# Patient Record
Sex: Female | Born: 1940
Health system: Southern US, Community
[De-identification: ages and names within clinical notes are randomized; demographics above are authoritative.]

## PROBLEM LIST (undated history)

## (undated) DIAGNOSIS — IMO0002 Reserved for concepts with insufficient information to code with codable children: Secondary | ICD-10-CM

## (undated) DIAGNOSIS — E039 Hypothyroidism, unspecified: Secondary | ICD-10-CM

## (undated) DIAGNOSIS — S62102A Fracture of unspecified carpal bone, left wrist, initial encounter for closed fracture: Secondary | ICD-10-CM

## (undated) DIAGNOSIS — R002 Palpitations: Secondary | ICD-10-CM

## (undated) DIAGNOSIS — M542 Cervicalgia: Secondary | ICD-10-CM

## (undated) DIAGNOSIS — M549 Dorsalgia, unspecified: Secondary | ICD-10-CM

## (undated) DIAGNOSIS — M810 Age-related osteoporosis without current pathological fracture: Secondary | ICD-10-CM

## (undated) DIAGNOSIS — N289 Disorder of kidney and ureter, unspecified: Secondary | ICD-10-CM

## (undated) DIAGNOSIS — L570 Actinic keratosis: Secondary | ICD-10-CM

## (undated) DIAGNOSIS — J929 Pleural plaque without asbestos: Secondary | ICD-10-CM

## (undated) DIAGNOSIS — I10 Essential (primary) hypertension: Secondary | ICD-10-CM

## (undated) DIAGNOSIS — C50919 Malignant neoplasm of unspecified site of unspecified female breast: Secondary | ICD-10-CM

## (undated) DIAGNOSIS — Z923 Personal history of irradiation: Secondary | ICD-10-CM

## (undated) DIAGNOSIS — K5730 Diverticulosis of large intestine without perforation or abscess without bleeding: Secondary | ICD-10-CM

## (undated) DIAGNOSIS — Z9221 Personal history of antineoplastic chemotherapy: Secondary | ICD-10-CM

## (undated) DIAGNOSIS — K219 Gastro-esophageal reflux disease without esophagitis: Secondary | ICD-10-CM

## (undated) DIAGNOSIS — N809 Endometriosis, unspecified: Secondary | ICD-10-CM

## (undated) HISTORY — DX: Dorsalgia, unspecified: M54.9

## (undated) HISTORY — DX: Malignant neoplasm of unspecified site of unspecified female breast: C50.919

## (undated) HISTORY — DX: Palpitations: R00.2

## (undated) HISTORY — PX: ENDOMETRIAL ABLATION: SHX621

## (undated) HISTORY — DX: Reserved for concepts with insufficient information to code with codable children: IMO0002

## (undated) HISTORY — DX: Diverticulosis of large intestine without perforation or abscess without bleeding: K57.30

## (undated) HISTORY — DX: Actinic keratosis: L57.0

## (undated) HISTORY — DX: Cervicalgia: M54.2

## (undated) HISTORY — DX: Hypothyroidism, unspecified: E03.9

## (undated) HISTORY — DX: Gastro-esophageal reflux disease without esophagitis: K21.9

## (undated) HISTORY — DX: Essential (primary) hypertension: I10

## (undated) HISTORY — DX: Fracture of unspecified carpal bone, left wrist, initial encounter for closed fracture: S62.102A

## (undated) HISTORY — DX: Pleural plaque without asbestos: J92.9

## (undated) HISTORY — DX: Age-related osteoporosis without current pathological fracture: M81.0

## (undated) HISTORY — PX: TUBAL LIGATION: SHX77

## (undated) HISTORY — DX: Endometriosis, unspecified: N80.9

---

## 1999-08-20 DIAGNOSIS — C50919 Malignant neoplasm of unspecified site of unspecified female breast: Secondary | ICD-10-CM

## 1999-08-20 HISTORY — PX: EXCISION / BIOPSY BREAST / NIPPLE / DUCT: SUR469

## 1999-08-20 HISTORY — PX: BREAST LUMPECTOMY: SHX2

## 1999-08-20 HISTORY — DX: Malignant neoplasm of unspecified site of unspecified female breast: C50.919

## 2002-03-19 ENCOUNTER — Encounter: Payer: Self-pay | Admitting: Internal Medicine

## 2006-03-13 ENCOUNTER — Ambulatory Visit: Payer: Self-pay | Admitting: Family Medicine

## 2006-04-23 ENCOUNTER — Ambulatory Visit: Payer: Self-pay | Admitting: Oncology

## 2006-06-09 ENCOUNTER — Ambulatory Visit: Payer: Self-pay | Admitting: Family Medicine

## 2006-06-09 LAB — CONVERTED CEMR LAB
Basophils Absolute: 0 10*3/uL (ref 0.0–0.1)
CO2: 31 meq/L (ref 19–32)
Calcium: 9.5 mg/dL (ref 8.4–10.5)
Chloride: 107 meq/L (ref 96–112)
Chol/HDL Ratio, serum: 4.2
Cholesterol: 189 mg/dL (ref 0–200)
Creatinine, Ser: 0.9 mg/dL (ref 0.4–1.2)
Glomerular Filtration Rate, Af Am: 81 mL/min/{1.73_m2}
Glucose, Bld: 86 mg/dL (ref 70–99)
HDL: 44.7 mg/dL (ref >39.0)
Hemoglobin: 14.9 g/dL (ref 12.0–15.0)
LDL Cholesterol: 129 mg/dL — ABNORMAL HIGH (ref 0–99)
Lymphocytes Relative: 27.5 % (ref 12.0–46.0)
MCV: 89.6 fL (ref 78.0–100.0)
Monocytes Absolute: 0.6 10*3/uL (ref 0.2–0.7)
Monocytes Relative: 8.7 % (ref 3.0–11.0)
Neutrophils Relative %: 61.4 % (ref 43.0–77.0)
Platelets: 198 10*3/uL (ref 150–400)
TSH: 1.1 microintl units/mL (ref 0.35–5.50)
Triglyceride fasting, serum: 78 mg/dL (ref 0–149)

## 2006-06-16 ENCOUNTER — Ambulatory Visit: Payer: Self-pay | Admitting: Family Medicine

## 2006-06-23 ENCOUNTER — Encounter: Admission: RE | Admit: 2006-06-23 | Discharge: 2006-06-23 | Payer: Self-pay | Admitting: Oncology

## 2006-09-16 ENCOUNTER — Ambulatory Visit: Payer: Self-pay | Admitting: Family Medicine

## 2006-09-30 ENCOUNTER — Ambulatory Visit: Payer: Self-pay | Admitting: Internal Medicine

## 2006-10-06 ENCOUNTER — Ambulatory Visit: Payer: Self-pay | Admitting: Internal Medicine

## 2006-11-17 ENCOUNTER — Ambulatory Visit: Payer: Self-pay | Admitting: Internal Medicine

## 2007-05-21 ENCOUNTER — Ambulatory Visit: Payer: Self-pay | Admitting: Oncology

## 2007-06-11 ENCOUNTER — Ambulatory Visit: Payer: Self-pay | Admitting: Family Medicine

## 2007-06-11 DIAGNOSIS — J309 Allergic rhinitis, unspecified: Secondary | ICD-10-CM | POA: Insufficient documentation

## 2007-06-11 LAB — CONVERTED CEMR LAB
ALT: 25 units/L (ref 0–35)
AST: 23 units/L (ref 0–37)
Alkaline Phosphatase: 83 units/L (ref 39–117)
BUN: 13 mg/dL (ref 6–23)
Basophils Relative: 0.3 % (ref 0.0–1.0)
Bilirubin, Direct: 0.1 mg/dL (ref 0.0–0.3)
CO2: 31 meq/L (ref 19–32)
Calcium: 10.1 mg/dL (ref 8.4–10.5)
Chloride: 106 meq/L (ref 96–112)
Creatinine, Ser: 0.8 mg/dL (ref 0.4–1.2)
Eosinophils Relative: 1.9 % (ref 0.0–5.0)
GFR calc Af Amer: 93 mL/min
Glucose, Bld: 91 mg/dL (ref 70–99)
HDL: 51.2 mg/dL (ref >39.0)
Lymphocytes Relative: 20.3 % (ref 12.0–46.0)
Monocytes Relative: 8.7 % (ref 3.0–11.0)
Neutro Abs: 7 10*3/uL (ref 1.4–7.7)
Platelets: 220 10*3/uL (ref 150–400)
RDW: 12.1 % (ref 11.5–14.6)
Total Bilirubin: 0.9 mg/dL (ref 0.3–1.2)
Total CHOL/HDL Ratio: 4.4
Total Protein: 7.5 g/dL (ref 6.0–8.3)
VLDL: 23 mg/dL (ref 0–40)
WBC: 10.2 10*3/uL (ref 4.5–10.5)

## 2007-06-17 ENCOUNTER — Ambulatory Visit: Payer: Self-pay | Admitting: Internal Medicine

## 2007-06-17 LAB — CONVERTED CEMR LAB
ALT: 33 units/L (ref 0–35)
Alkaline Phosphatase: 104 units/L (ref 39–117)
Basophils Absolute: 0.1 10*3/uL (ref 0.0–0.1)
Eosinophils Absolute: 0.2 10*3/uL (ref 0.0–0.6)
Eosinophils Relative: 1.9 % (ref 0.0–5.0)
Lipase: 32 units/L (ref 11.0–59.0)
Lymphocytes Relative: 14.8 % (ref 12.0–46.0)
MCHC: 33.9 g/dL (ref 30.0–36.0)
MCV: 91.3 fL (ref 78.0–100.0)
Monocytes Relative: 12 % — ABNORMAL HIGH (ref 3.0–11.0)
Neutro Abs: 7.3 10*3/uL (ref 1.4–7.7)
Platelets: 241 10*3/uL (ref 150–400)
RBC: 4.75 M/uL (ref 3.87–5.11)
Total Protein: 8 g/dL (ref 6.0–8.3)

## 2007-06-18 ENCOUNTER — Ambulatory Visit (HOSPITAL_COMMUNITY): Admission: RE | Admit: 2007-06-18 | Discharge: 2007-06-18 | Payer: Self-pay | Admitting: Internal Medicine

## 2007-06-18 ENCOUNTER — Ambulatory Visit: Payer: Self-pay | Admitting: Family Medicine

## 2007-06-18 DIAGNOSIS — Z853 Personal history of malignant neoplasm of breast: Secondary | ICD-10-CM | POA: Insufficient documentation

## 2007-06-18 DIAGNOSIS — E039 Hypothyroidism, unspecified: Secondary | ICD-10-CM | POA: Insufficient documentation

## 2007-06-18 DIAGNOSIS — E0590 Thyrotoxicosis, unspecified without thyrotoxic crisis or storm: Secondary | ICD-10-CM | POA: Insufficient documentation

## 2007-06-18 DIAGNOSIS — K829 Disease of gallbladder, unspecified: Secondary | ICD-10-CM | POA: Insufficient documentation

## 2007-06-19 ENCOUNTER — Telehealth: Payer: Self-pay | Admitting: Family Medicine

## 2007-06-25 ENCOUNTER — Encounter: Admission: RE | Admit: 2007-06-25 | Discharge: 2007-06-25 | Payer: Self-pay | Admitting: Oncology

## 2007-07-08 ENCOUNTER — Encounter: Admission: RE | Admit: 2007-07-08 | Discharge: 2007-07-08 | Payer: Self-pay | Admitting: Oncology

## 2007-07-30 ENCOUNTER — Telehealth: Payer: Self-pay | Admitting: Family Medicine

## 2007-07-31 ENCOUNTER — Ambulatory Visit: Payer: Self-pay | Admitting: Family Medicine

## 2007-08-04 ENCOUNTER — Telehealth: Payer: Self-pay | Admitting: Family Medicine

## 2007-09-21 ENCOUNTER — Ambulatory Visit: Payer: Self-pay | Admitting: Family Medicine

## 2007-09-23 ENCOUNTER — Telehealth: Payer: Self-pay | Admitting: Family Medicine

## 2007-10-12 DIAGNOSIS — C50919 Malignant neoplasm of unspecified site of unspecified female breast: Secondary | ICD-10-CM | POA: Insufficient documentation

## 2007-10-12 DIAGNOSIS — Z853 Personal history of malignant neoplasm of breast: Secondary | ICD-10-CM | POA: Insufficient documentation

## 2007-12-02 ENCOUNTER — Telehealth: Payer: Self-pay | Admitting: Family Medicine

## 2008-01-22 ENCOUNTER — Ambulatory Visit: Payer: Self-pay | Admitting: Family Medicine

## 2008-01-23 DIAGNOSIS — L570 Actinic keratosis: Secondary | ICD-10-CM | POA: Insufficient documentation

## 2008-02-09 ENCOUNTER — Telehealth: Payer: Self-pay | Admitting: Family Medicine

## 2008-03-11 ENCOUNTER — Ambulatory Visit: Payer: Self-pay | Admitting: Internal Medicine

## 2008-03-11 DIAGNOSIS — M545 Low back pain, unspecified: Secondary | ICD-10-CM | POA: Insufficient documentation

## 2008-03-11 DIAGNOSIS — M549 Dorsalgia, unspecified: Secondary | ICD-10-CM | POA: Insufficient documentation

## 2008-04-19 ENCOUNTER — Ambulatory Visit: Payer: Self-pay | Admitting: Family Medicine

## 2008-04-19 DIAGNOSIS — L253 Unspecified contact dermatitis due to other chemical products: Secondary | ICD-10-CM | POA: Insufficient documentation

## 2008-06-13 ENCOUNTER — Ambulatory Visit: Payer: Self-pay | Admitting: Family Medicine

## 2008-06-13 LAB — CONVERTED CEMR LAB
AST: 20 units/L (ref 0–37)
Albumin: 3.9 g/dL (ref 3.5–5.2)
BUN: 21 mg/dL (ref 6–23)
Basophils Absolute: 0 10*3/uL (ref 0.0–0.1)
Basophils Relative: 0.4 % (ref 0.0–3.0)
Bilirubin Urine: NEGATIVE
Blood in Urine, dipstick: NEGATIVE
Calcium: 9.6 mg/dL (ref 8.4–10.5)
Cholesterol: 191 mg/dL (ref 0–200)
Creatinine, Ser: 0.8 mg/dL (ref 0.4–1.2)
Eosinophils Absolute: 0.2 10*3/uL (ref 0.0–0.7)
Eosinophils Relative: 3.2 % (ref 0.0–5.0)
GFR calc Af Amer: 93 mL/min
GFR calc non Af Amer: 77 mL/min
Glucose, Urine, Semiquant: NEGATIVE
HCT: 40.7 % (ref 36.0–46.0)
HDL: 38.7 mg/dL — ABNORMAL LOW (ref >39.0)
MCHC: 33.5 g/dL (ref 30.0–36.0)
MCV: 91.6 fL (ref 78.0–100.0)
Monocytes Absolute: 0.5 10*3/uL (ref 0.1–1.0)
Neutro Abs: 3.9 10*3/uL (ref 1.4–7.7)
Protein, U semiquant: NEGATIVE
RBC: 4.45 M/uL (ref 3.87–5.11)
Specific Gravity, Urine: 1.015
TSH: 0.19 microintl units/mL — ABNORMAL LOW (ref 0.35–5.50)
WBC Urine, dipstick: NEGATIVE
WBC: 6.3 10*3/uL (ref 4.5–10.5)
pH: 5.5

## 2008-06-20 ENCOUNTER — Ambulatory Visit: Payer: Self-pay | Admitting: Family Medicine

## 2008-06-21 ENCOUNTER — Ambulatory Visit: Payer: Self-pay | Admitting: Oncology

## 2008-06-27 ENCOUNTER — Encounter: Admission: RE | Admit: 2008-06-27 | Discharge: 2008-06-27 | Payer: Self-pay | Admitting: Oncology

## 2008-08-19 HISTORY — PX: CHOLECYSTECTOMY: SHX55

## 2008-08-30 ENCOUNTER — Ambulatory Visit: Payer: Self-pay | Admitting: Family Medicine

## 2008-08-30 LAB — CONVERTED CEMR LAB: TSH: 8.39 microintl units/mL — ABNORMAL HIGH (ref 0.35–5.50)

## 2008-09-08 ENCOUNTER — Telehealth: Payer: Self-pay | Admitting: Family Medicine

## 2008-11-08 ENCOUNTER — Ambulatory Visit: Payer: Self-pay | Admitting: Family Medicine

## 2008-11-15 ENCOUNTER — Telehealth: Payer: Self-pay | Admitting: *Deleted

## 2008-11-15 LAB — CONVERTED CEMR LAB: TSH: 0.38 microintl units/mL (ref 0.35–5.50)

## 2008-12-06 ENCOUNTER — Ambulatory Visit: Payer: Self-pay | Admitting: Family Medicine

## 2008-12-06 ENCOUNTER — Encounter: Payer: Self-pay | Admitting: Family Medicine

## 2008-12-06 DIAGNOSIS — D235 Other benign neoplasm of skin of trunk: Secondary | ICD-10-CM | POA: Insufficient documentation

## 2008-12-06 DIAGNOSIS — D2330 Other benign neoplasm of skin of unspecified part of face: Secondary | ICD-10-CM | POA: Insufficient documentation

## 2008-12-13 ENCOUNTER — Telehealth: Payer: Self-pay | Admitting: Family Medicine

## 2009-02-08 ENCOUNTER — Encounter (INDEPENDENT_AMBULATORY_CARE_PROVIDER_SITE_OTHER): Payer: Self-pay | Admitting: *Deleted

## 2009-03-02 ENCOUNTER — Ambulatory Visit: Payer: Self-pay | Admitting: Internal Medicine

## 2009-03-02 ENCOUNTER — Telehealth: Payer: Self-pay | Admitting: Internal Medicine

## 2009-03-02 DIAGNOSIS — N809 Endometriosis, unspecified: Secondary | ICD-10-CM | POA: Insufficient documentation

## 2009-03-02 DIAGNOSIS — K5730 Diverticulosis of large intestine without perforation or abscess without bleeding: Secondary | ICD-10-CM | POA: Insufficient documentation

## 2009-03-03 LAB — CONVERTED CEMR LAB
ALT: 21 units/L (ref 0–35)
Total Bilirubin: 0.8 mg/dL (ref 0.3–1.2)

## 2009-04-07 ENCOUNTER — Encounter: Payer: Self-pay | Admitting: Internal Medicine

## 2009-04-12 ENCOUNTER — Ambulatory Visit: Payer: Self-pay | Admitting: Internal Medicine

## 2009-04-12 LAB — HM COLONOSCOPY

## 2009-06-05 ENCOUNTER — Encounter: Payer: Self-pay | Admitting: Internal Medicine

## 2009-06-05 LAB — CONVERTED CEMR LAB
ALT: 18 units/L
AST: 22 units/L
Calcium: 10 mg/dL
Chloride: 102 meq/L
Glucose, Bld: 84 mg/dL
Hemoglobin: 14.5 g/dL
Lymphocytes, automated: 31 %
MCV: 89.7 fL
Monocytes Relative: 12 %
Potassium: 4.4 meq/L

## 2009-06-09 ENCOUNTER — Ambulatory Visit (HOSPITAL_COMMUNITY): Admission: RE | Admit: 2009-06-09 | Discharge: 2009-06-09 | Payer: Self-pay | Admitting: General Surgery

## 2009-06-09 ENCOUNTER — Encounter: Payer: Self-pay | Admitting: Internal Medicine

## 2009-06-09 ENCOUNTER — Encounter (INDEPENDENT_AMBULATORY_CARE_PROVIDER_SITE_OTHER): Payer: Self-pay | Admitting: General Surgery

## 2009-06-29 ENCOUNTER — Encounter: Payer: Self-pay | Admitting: Internal Medicine

## 2009-06-30 ENCOUNTER — Encounter: Admission: RE | Admit: 2009-06-30 | Discharge: 2009-06-30 | Payer: Self-pay | Admitting: General Surgery

## 2009-07-10 ENCOUNTER — Telehealth: Payer: Self-pay | Admitting: Family Medicine

## 2009-07-21 ENCOUNTER — Encounter: Admission: RE | Admit: 2009-07-21 | Discharge: 2009-07-21 | Payer: Self-pay | Admitting: Family Medicine

## 2009-10-12 ENCOUNTER — Telehealth: Payer: Self-pay | Admitting: Family Medicine

## 2009-10-19 ENCOUNTER — Ambulatory Visit: Payer: Self-pay | Admitting: Family Medicine

## 2009-10-19 LAB — CONVERTED CEMR LAB
Free T4: 0.8 ng/dL (ref 0.6–1.6)
T3, Free: 2.3 pg/mL (ref 2.3–4.2)
TSH: 6.37 microintl units/mL — ABNORMAL HIGH (ref 0.35–5.50)

## 2009-10-20 ENCOUNTER — Telehealth: Payer: Self-pay | Admitting: Family Medicine

## 2009-12-13 ENCOUNTER — Encounter: Payer: Self-pay | Admitting: Internal Medicine

## 2009-12-18 ENCOUNTER — Ambulatory Visit: Payer: Self-pay | Admitting: Internal Medicine

## 2009-12-18 DIAGNOSIS — G8929 Other chronic pain: Secondary | ICD-10-CM | POA: Insufficient documentation

## 2009-12-18 DIAGNOSIS — M542 Cervicalgia: Secondary | ICD-10-CM | POA: Insufficient documentation

## 2009-12-18 LAB — CONVERTED CEMR LAB
ALT: 17 units/L (ref 0–35)
Alkaline Phosphatase: 67 units/L (ref 39–117)
Basophils Relative: 0.5 % (ref 0.0–3.0)
Bilirubin, Direct: 0.1 mg/dL (ref 0.0–0.3)
Calcium: 9.9 mg/dL (ref 8.4–10.5)
Creatinine, Ser: 0.8 mg/dL (ref 0.4–1.2)
Eosinophils Absolute: 0.3 10*3/uL (ref 0.0–0.7)
Eosinophils Relative: 3.6 % (ref 0.0–5.0)
GFR calc non Af Amer: 75.93 mL/min (ref >60)
HDL: 44.6 mg/dL (ref >39.00)
Hemoglobin, Urine: NEGATIVE
LDL Cholesterol: 133 mg/dL — ABNORMAL HIGH (ref 0–99)
Lymphocytes Relative: 27.9 % (ref 12.0–46.0)
Neutrophils Relative %: 58.7 % (ref 43.0–77.0)
RBC: 4.56 M/uL (ref 3.87–5.11)
Total CHOL/HDL Ratio: 4
Total Protein, Urine: NEGATIVE mg/dL
Total Protein: 7 g/dL (ref 6.0–8.3)
Triglycerides: 96 mg/dL (ref 0.0–149.0)
Urine Glucose: NEGATIVE mg/dL
WBC: 7.2 10*3/uL (ref 4.5–10.5)
pH: 5 (ref 5.0–8.0)

## 2009-12-27 ENCOUNTER — Encounter: Payer: Self-pay | Admitting: Internal Medicine

## 2010-02-12 ENCOUNTER — Encounter: Payer: Self-pay | Admitting: Internal Medicine

## 2010-03-02 ENCOUNTER — Telehealth: Payer: Self-pay | Admitting: Internal Medicine

## 2010-04-09 ENCOUNTER — Ambulatory Visit: Payer: Self-pay | Admitting: Internal Medicine

## 2010-04-09 DIAGNOSIS — J019 Acute sinusitis, unspecified: Secondary | ICD-10-CM | POA: Insufficient documentation

## 2010-05-22 ENCOUNTER — Encounter: Payer: Self-pay | Admitting: Internal Medicine

## 2010-05-22 LAB — CONVERTED CEMR LAB: TSH: 6.36 microintl units/mL

## 2010-05-28 ENCOUNTER — Encounter: Payer: Self-pay | Admitting: Internal Medicine

## 2010-05-29 ENCOUNTER — Telehealth: Payer: Self-pay | Admitting: Internal Medicine

## 2010-07-16 ENCOUNTER — Ambulatory Visit: Payer: Self-pay | Admitting: Internal Medicine

## 2010-07-23 ENCOUNTER — Encounter: Admission: RE | Admit: 2010-07-23 | Discharge: 2010-07-23 | Payer: Self-pay | Admitting: Obstetrics and Gynecology

## 2010-09-09 ENCOUNTER — Encounter: Payer: Self-pay | Admitting: Family Medicine

## 2010-09-09 ENCOUNTER — Encounter: Payer: Self-pay | Admitting: Oncology

## 2010-09-18 NOTE — Progress Notes (Signed)
Summary: Pt req to get TSH lvl checked. Pt not sleeping well.   Phone Note Call from Patient Call back at Home Phone [phone]   Caller: Patient Summary of Call: Pt called and has her cpx sch for 11/21/09 and fasting labs on 11/13/09. Pt said that she would like to get just her TSH lvl checked before ov, because pt is sleeping well at night and feels that her thyroid med dosage needs to be adjusted. Please advise.  Initial call taken by: Lucy Antigua,  October 12, 2009 2:51 PM  Follow-up for Phone Call        TSH level, free T4 and free T3....... could number 244.9, nonfasting.... this week Follow-up by: Roderick Pee MD,  October 16, 2009 8:23 AM  Additional Follow-up for Phone Call Additional follow up Details #1::        Phone Call Completed----Done----Additional labs (TSH level, free T4 and free T3) put in for labs on 11/13/2009. Additional Follow-up by: Debbra Riding,  October 16, 2009 11:46 AM

## 2010-09-18 NOTE — Progress Notes (Signed)
  Phone Note Outgoing Call   Summary of Call: increase Synthroid to 75 micrograms daily follow-up TSH level in 4 to 6 weeks when she comes in for her physical Initial call taken by: Roderick Pee MD,  October 20, 2009 5:25 PM

## 2010-09-18 NOTE — Progress Notes (Signed)
Summary: levoxyl  Phone Note Refill Request Message from:  Fax from Pharmacy on March 02, 2010 9:24 AM  Refills Requested: Medication #1:  LEVOXYL 75 MCG  TABS 1 by mouth once daily except 1/2 tab by mouth on Sundays   Last Refilled: 03/01/2010  Method Requested: Electronic Initial call taken by: Lucy Brand,  March 02, 2010 9:24 AM    Prescriptions: LEVOXYL 75 MCG  TABS (LEVOTHYROXINE SODIUM) 1 by mouth once daily except 1/2 tab by mouth on Sundays  #100 x 3   Entered by:   Lucy Brand   Authorized by:   Jaretzi Droz A Lleyton Byers MD   Signed by:   Lucy Brand on 03/02/2010   Method used:   Electronically to        [facility] [address] (retail)       [address]       [place], [place]  [address]       Ph: [phone]       Fax: [phone]   RxID:   [id number]

## 2010-09-18 NOTE — Letter (Signed)
Summary: [facility]  [facility]   Imported By: Lester  01/01/2010 09:02:47  _____________________________________________________________________  External Attachment:    Type:   Image     Comment:   External Document

## 2010-09-18 NOTE — Miscellaneous (Signed)
Summary: PT Discharge/[facility]  PT Discharge/[facility]   Imported By: Sherian Rein 02/22/2010 10:11:54  _____________________________________________________________________  External Attachment:    Type:   Image     Comment:   External Document

## 2010-09-18 NOTE — Assessment & Plan Note (Signed)
Summary: drainage from nose/sore throat/chills/bodyaches/lb   Vital Signs:  Patient profile:   70 year old female Height:      61 inches Weight:      117 pounds O2 Sat:      95 % on Room air Temp:     100.8 degrees F oral Pulse rate:   82 / minute BP sitting:   102 / 72  (right arm)  Vitals Entered By: Orlan Leavens RMA (April 09, 2010 3:38 PM)  O2 Flow:  Room air CC: Nasal drainage/ sore throat/ sore throat/ bodyache x's 3 days, URI symptoms Is Patient Diabetic? No Pain Assessment Patient in pain? no        Primary Care Provider:  Newt Lukes, MD  CC:  Nasal drainage/ sore throat/ sore throat/ bodyache x's 3 days and URI symptoms.  History of Present Illness: review other chronic med issues  1) hypothyroid - reports compliance with ongoing medical treatment;recent changes in medication dose 10/2009 reviewed. denies adverse side effects related to current therapy. no weight or skin changes  2) neck pain - reports hx "bad neck on xray" years ago - increasing tightness without radiating pain across back of neck L>R side - not improved with massage - discomfort most when lying down at night - no weakness, no balance problems or trouble walking - no arm numbness or grip weakenss  3) palpitations - take atenolol low dose for same - generally controls heart symptoms without dizziness or low BP - no racing or sustained palp - worse with cholcolate and caffiene use  4) remote hx breast ca - no recurrence s/p chemo and lumpectomy in 2001 related to same  URI Symptoms      This is a 70 year old woman who presents with URI symptoms.  The symptoms began 4 days ago.  The severity is described as moderate.  The patient reports nasal congestion, purulent nasal discharge, sore throat, and earache, but denies dry cough, productive cough, and sick contacts.  Associated symptoms include fever.  The patient denies stiff neck, dyspnea, wheezing, rash, and vomiting.  The patient also reports  itchy watery eyes, headache, muscle aches, and severe fatigue.  The patient denies itchy throat, sneezing, seasonal symptoms, and response to antihistamine.  Risk factors for Strep sinusitis include absence of cough.  The patient denies the following risk factors for Strep sinusitis: tooth pain, Strep exposure, and tender adenopathy.    Current Medications (verified): 1)  Levoxyl 75 Mcg  Tabs (Levothyroxine Sodium) .Marland Kitchen.. 1 By Mouth Once Daily Except 1/2 Tab By Mouth On Sundays 2)  Tenormin 25 Mg Tabs (Atenolol) .Marland Kitchen.. 1 By Mouth Once Daily 3)  Calcium + D + K 750-500-40 Mg-Unt-Mcg Tabs (Calcium-Vitamin D-Vitamin K) .Marland Kitchen.. 1 By Mouth Two Times A Day 4)  Multivitamins  Tabs (Multiple Vitamin) .Marland Kitchen.. 1 By Mouth Once Daily 5)  Robaxin 500 Mg Tabs (Methocarbamol) .Marland Kitchen.. 1 By Mouth Every 8hours As Needed For Muscle Spasm and Neck Pain  Allergies (verified): No Known Drug Allergies  Past History:  Past Medical History: Allergic rhinitis Breast cancer - left,  2000 - lumpectomy, chemo, XRT Hyperthyroidism Hypothyroidism history of BC and squamous cell carcinoma   MD roster: GI Juanda Chance gyn - Cousins gen surg - rosenbower  Review of Systems       The patient complains of fever and hoarseness.  The patient denies anorexia, weight loss, prolonged cough, and hemoptysis.    Physical Exam  General:  alert, well-developed, well-nourished, and  cooperative to examination.   mod ill Eyes:  vision grossly intact; pupils equal, round and reactive to light.  mild conjunctival tearing and lids normal.    Ears:  normal pinnae bilaterally, without erythema, swelling, or tenderness to palpation. TMs clear, without effusion, or cerumen impaction. Hearing grossly normal bilaterally  Nose:  +tenderness to pressure over both frontal sinuses Mouth:  teeth and gums in good repair; mucous membranes moist, without lesions or ulcers. oropharynx clear without exudate, mod erythema. +yellow PND Lungs:  normal respiratory  effort, no intercostal retractions or use of accessory muscles; normal breath sounds bilaterally - no crackles and no wheezes.    Heart:  normal rate, regular rhythm, no murmur, and no rub. BLE without edema.   Impression & Recommendations:  Problem # 1:  ACUTE FRONTAL SINUSITIS (ICD-461.1)  tx with abx given fever and classic symptoms - st mgmt with OTC tylenol and antihist/decongest as needed  Her updated medication list for this problem includes:    Amoxicillin-pot Clavulanate 875-125 Mg Tabs (Amoxicillin-pot clavulanate) .Marland Kitchen... 1 by mouth two times a day x 7 days  Instructed on treatment. Call if symptoms persist or worsen.   Orders: Prescription Created Electronically [phone])  Complete Medication List: 1)  Levoxyl 75 Mcg Tabs (Levothyroxine sodium) .Marland Kitchen.. 1 by mouth once daily except 1/2 tab by mouth on "sundays 2)  Tenormin 25 Mg Tabs (Atenolol) .... 1 by mouth once daily 3)  Calcium + D + K 750-500-40 Mg-unt-mcg Tabs (Calcium-vitamin d-vitamin k) .... 1 by mouth two times a day 4)  Multivitamins Tabs (Multiple vitamin) .... 1 by mouth once daily 5)  Robaxin 500 Mg Tabs (Methocarbamol) .... 1 by mouth every 8hours as needed for muscle spasm and neck pain 6)  Amoxicillin-pot Clavulanate 875-125 Mg Tabs (Amoxicillin-pot clavulanate) .... 1 by mouth two times a day x 7 days  Patient Instructions: 1)  it was good to see you today. 2)  augmentin antibiotics for your infection - your prescriptionhase been electronically submitted to your pharmacy. Please take as directed. Contact our office if you believe you're having problems with the medication(s).  3)  Get plenty of rest, drink lots of clear liquids, and use Tylenol or Ibuprofen for fever and comfort. Return in 7-10 days if you're not better:sooner if you're feeling worse. Prescriptions: AMOXICILLIN-POT CLAVULANATE 875-125 MG TABS (AMOXICILLIN-POT CLAVULANATE) 1 by mouth two times a day x 7 days  #14 x 0   Entered and Authorized by:    Valerie A Leschber MD   Signed by:   Valerie A Leschber MD on 04/09/2010   Method used:   Electronically to        [facility] [address] (retail)       [address]       [place], [place]  [address]       Ph: [phone]       Fax: [phone]   RxID:   [id number]

## 2010-09-18 NOTE — Assessment & Plan Note (Signed)
Summary: NEW MEDICARE PT YEARLY--PKG-FORMER DR TODD PT -CLOSER THIS OF...   Vital Signs:  Patient profile:   70 year old female Height:      61 inches Weight:      117.75 pounds BMI:     22.33 O2 Sat:      96 % on Room air Temp:     97.6 degrees F oral Pulse rate:   63 / minute BP sitting:   102 / 64  (left arm) Cuff size:   regular  Vitals Entered ByZella Ball Ewing (Dec 18, 2009 8:56 AM)  O2 Flow:  Room air CC: Adult Physical/RE   Primary Care Provider:  Newt Lukes, MD  CC:  Adult Physical/RE.  History of Present Illness: new pt to me but known to our division as former pt with dr. Tawanna Cooler - txfr here for office location convience  patient is here today for annual physical. Patient feels well and has no complaints.   also review other chronic med issues  1) hypothyroid - reports compliance with ongoing medical treatment;recent changes in medication dose 10/2009 reviewed. denies adverse side effects related to current therapy. no weight or skin changes  2) neck pain - reports hx "bad neck on xray" years ago - increasing tightness without radiating pain across back of neck L>R side - not improved with massage - discomfort most when lying down at night - no weakness, no balance problems or trouble walking - no arm numbness or grip weakenss  3) palpitations - take atenolol low dose for same - generally controls heart symptoms without dizziness or low BP - no racing or sustained palp - worse with cholcolate and caffiene use  4) remote hx breast ca - no recurrence s/p chemo and lumpectomy in 2001 related to same  Preventive Screening-Counseling & Management  Alcohol-Tobacco     Alcohol drinks/day: 0     Alcohol Counseling: not indicated; patient does not drink     Smoking Status: never     Tobacco Counseling: not indicated; no tobacco use  Caffeine-Diet-Exercise     Caffeine Counseling: decrease use of caffeine     Diet Counseling: not indicated; diet is assessed to be  healthy     Does Patient Exercise: no     Exercise Counseling: to improve exercise regimen     Depression Counseling: not indicated; screening negative for depression  Safety-Violence-Falls     Seat Belt Use: yes     Helmet Use: yes     Firearms in the Home: no firearms in the home     Smoke Detectors: yes     Violence in the Home: no risk noted     Fall Risk Counseling: not indicated; no significant falls noted  Clinical Review Panels:  Prevention   Last Mammogram:  Location: [facility].   No specific mammographic evidence of malignancy.  Assessment: BIRADS 1. (07/21/2009)   Last Colonoscopy:  Location:  [facility].  (04/12/2009)  Immunizations   Last Tetanus Booster:  Td (06/18/2007)   Last Flu Vaccine:  Fluvax MCR (06/18/2007)   Last Pneumovax:  Pneumovax (06/18/2007)  Lipid Management   Cholesterol:  191 (06/13/2008)   LDL (bad choesterol):  128 (06/13/2008)   HDL (good cholesterol):  38.7 (06/13/2008)   Triglycerides:  78 (06/09/2006)  CBC   WBC:  6.4 (06/05/2009)   RBC:  4.74 (06/05/2009)   Hgb:  14.5 (06/05/2009)   Hct:  42.5 (06/05/2009)   Platelets:  176 (  06/05/2009)   MCV  89.7 (06/05/2009)   MCHC  33.5 (06/13/2008)   RDW  12.6 (06/05/2009)   PMN:  55 (06/05/2009)   Lymphs:  27.5 (06/13/2008)   Monos:  12 (06/05/2009)   Eosinophils:  2 (06/05/2009)   Basophil:  1 (06/05/2009)  Complete Metabolic Panel   Glucose:  84 (06/05/2009)   Sodium:  137 (06/05/2009)   Potassium:  4.4 (06/05/2009)   Chloride:  102 (06/05/2009)   CO2:  29 (06/05/2009)   BUN:  20 (06/05/2009)   Creatinine:  0.81 (06/05/2009)   Albumin:  3.9 (06/05/2009)   Total Protein:  7.1 (06/05/2009)   Calcium:  10.0 (06/05/2009)   Total Bili:  0.6 (06/05/2009)   Alk Phos:  71 (06/05/2009)   SGPT (ALT):  18 (06/05/2009)   SGOT (AST):  22 (06/05/2009)   Current Medications (verified): 1)  Levoxyl 75 Mcg  Tabs (Levothyroxine Sodium) .... Once  Daily 2)  Tenormin 50 Mg  Tabs (Atenolol) .... 1/2  Am 3)  Prednisone 20 Mg Tabs (Prednisone) .... Uad 4)  Calcium-Vitamin D 250-125 Mg-Unit Tabs (Calcium Carbonate-Vitamin D) .Marland Kitchen.. 1 By Mouth Once Daily 5)  Multivitamins  Tabs (Multiple Vitamin) .Marland Kitchen.. 1 By Mouth Once Daily 6)  Flonase 50 Mcg/act Susp (Fluticasone Propionate) .... Uad  Allergies (verified): No Known Drug Allergies  Past History:  Past Medical History: Allergic rhinitis Breast cancer - left,  2000 - lumpectomy, chemo, XRT Hyperthyroidism Hypothyroidism history of BC and squamous cell carcinoma   MD rooster: GI -Brodie gyn - Cousins gen surg - rosenbower  Past Surgical History: laparoscopy with endometrial ablation (2009)  Family History: Family History of Colon Polyps: Family History of Heart Disease: Father Family History of CAD Female 1st degree relative <50  Social History: Retired Married, lives with spouse - in [place] area since 2008 from [place] to be close to dtrs Never Smoked Alcohol use-no Drug use-no Regular exercise-yes Does Patient Exercise:  no Seat Belt Use:  yes  Review of Systems       see HPI above. I have reviewed all other systems and they were negative.   Physical Exam  General:  alert, well-developed, well-nourished, and cooperative to examination.    Eyes:  vision grossly intact; pupils equal, round and reactive to light.  conjunctiva and lids normal.    Ears:  normal pinnae bilaterally, without erythema, swelling, or tenderness to palpation. TMs clear, without effusion, or cerumen impaction. Hearing grossly normal bilaterally  Mouth:  teeth and gums in good repair; mucous membranes moist, without lesions or ulcers. oropharynx clear without exudate, no erythema.  Neck:  supple, full ROM, no masses, no thyromegaly; no thyroid nodules or tenderness. no JVD or carotid bruits.   Lungs:  normal respiratory effort, no intercostal retractions or use of accessory muscles; normal breath  sounds bilaterally - no crackles and no wheezes.    Heart:  normal rate, regular rhythm, no murmur, and no rub. BLE without edema. normal DP pulses and normal cap refill in all 4 extremities    Abdomen:  soft, non-tender, normal bowel sounds, no distention; no masses and no appreciable hepatomegaly or splenomegaly.   Genitalia:  defer to gyn Msk:  myofascial pain L>R paraspinal region of neck Neurologic:  alert & oriented X3 and cranial nerves II-XII symetrically intact.  strength normal in all extremities, sensation intact to light touch, and gait normal. speech fluent without dysarthria or aphasia; follows commands with good comprehension.  Skin:  no rashes, vesicles, ulcers, or erythema.  No nodules or irregularity to palpation.  Psych:  Oriented X3, memory intact for recent and remote, normally interactive, good eye contact, not anxious appearing, not depressed appearing, and not agitated.      Impression & Recommendations:  Problem # 1:  PREVENTIVE HEALTH CARE (ICD-V70.0) Patient has been counseled on age-appropriate routine health concerns for screening and prevention. These are reviewed and up-to-date. Immunizations are up-to-date or declined. Labs ordered and ECG reviewed.  Orders: EKG w/ Interpretation (93000) TLB-Lipid Panel (80061-LIPID) TLB-BMP (Basic Metabolic Panel-BMET) (80048-METABOL) TLB-CBC Platelet - w/Differential (85025-CBCD) TLB-Hepatic/Liver Function Pnl (80076-HEPATIC) TLB-TSH (Thyroid Stimulating Hormone) (84443-TSH) TLB-Udip ONLY (81003-UDIP)  Problem # 2:  CERVICALGIA (ICD-723.1) check xray now to look for DDD given hx abn xray - add robaxin for muscle relaxant to help night pain and refer to PT for exercise and other tx as needed - no radiculopathy on exam or hx Her updated medication list for this problem includes:    Robaxin 500 Mg Tabs (Methocarbamol) .Marland Kitchen... 1 by mouth every 8hours as needed for muscle spasm and neck pain  Orders: T-Cervicle Spine 2-3 Views  [phone]) Prescription Created Electronically [phone]) Physical Therapy Referral (PT)  Problem # 3:  HYPOTHYROIDISM (ICD-244.9)  Her updated medication list for this problem includes:    Levoxyl 75 Mcg Tabs (Levothyroxine sodium) ..... Once daily  Labs Reviewed: TSH: 6.37 (10/19/2009)    Chol: 191 (06/13/2008)   HDL: 38.7 (06/13/2008)   LDL: 128 (06/13/2008)   TG: 120 (06/13/2008)  Complete Medication List: 1)  Levoxyl 75 Mcg Tabs (Levothyroxine sodium) .... Once daily 2)  Tenormin 25 Mg Tabs (Atenolol) .Marland Kitchen.. 1 by mouth once daily 3)  Calcium + D + K 750-500-40 Mg-unt-mcg Tabs (Calcium-vitamin d-vitamin k) .Marland Kitchen.. 1 by mouth two times a day 4)  Multivitamins Tabs (Multiple vitamin) .Marland Kitchen.. 1 by mouth once daily 5)  Robaxin 500 Mg Tabs (Methocarbamol) .Marland Kitchen.. 1 by mouth every 8hours as needed for muscle spasm and neck pain  Patient Instructions: 1)  it was good to see you today. 2)  test(s) ordered today - labs + xray of neck - your results will be posted on the phone tree for review in 48-72 hours from the time of test completion; call [phone] and enter your 9 digit MRN (listed above on this page, just below your name); if any changes need to be made or there are abnormal results, you will be contacted directly.  3)  try robaxin for muscle relaxant to help with muscle spasm ad neck pain - your prescription has been electronically submitted to your pharmacy. Please take as directed. Contact our office if you believe you're having problems with the medication(s).  4)  alos we'll make referral to physcial therapy for your neck and back. Our office will contact you regarding this appointment once made.  5)  consider the Zostavax (shingles vaccine) at future visits - our office can inform you about costs that may be associated with this 6)  Please schedule a follow-up appointment in 12 months for annual physical and labs, sooner if problems.  Prescriptions: ROBAXIN 500 MG TABS (METHOCARBAMOL) 1 by  mouth every 8hours as needed for muscle spasm and neck pain  #30 x 1   Entered and Authorized by:   Newt Lukes MD   Signed by:   Newt Lukes MD on 12/18/2009   Method used:   Electronically to        [facility] (retail)       [phone]  [address]       [place], [place]  [address]       Ph: [phone]       Fax: [phone]   RxID:   [id number]

## 2010-09-18 NOTE — Assessment & Plan Note (Signed)
Summary: sinus inf/headache/earache/sore throat/cjr   Vital Signs:  Patient profile:   70 year old female Weight:      121 pounds Temp:     98 degrees F oral BP sitting:   122 / 70  (right arm) Cuff size:   regular CC: ? sinus infection   Primary Care Provider:  Tinnie Gens Shauntee Karp,MD  CC:  ? sinus infection.  History of Present Illness: Jaeli is a 70 year old, married female, nonsmoker, who comes in today with a 3-week history of allergic rhinitis.  Her allergies bother her or urogram.  Three weeks ago they became worse.  She has had congestion, postnasal drip, popping in her ears.  No history of asthma.  She's tried Claritin alone than Claritin and Zyrtec daily without any appreciable decrease in her symptoms.  She also has a history of hypothyroidism.  She takes Synthroid 75 micrograms Monday through Saturday, skips Sunday.  Feels that her thyroid dose is too strong, requesting a TSH today  she states that she was given prednisone by the general surgeon in October, when she had her gallbladder removed.  She states the prednisone caused her to have an allergic reaction with facial swelling  Allergies: No Known Drug Allergies  Past History:  Past medical, surgical, family and social histories (including risk factors) reviewed for relevance to current acute and chronic problems.  Past Medical History: Reviewed history from 10/12/2007 and no changes required. Allergic rhinitis Breast cancer, hx of 2000 Hyperthyroidism Hypothyroidism gallbladder disease history of BCe and squamous cell carcinoma lt.breat lumpectomy,laproscopic ablation of edometriosis.  Past Surgical History: Reviewed history from 03/02/2009 and no changes required. laparoscopy with endometrial ablation  Family History: Reviewed history from 03/02/2009 and no changes required. Family History of Colon Polyps: Family History of Heart Disease: Father  Social History: Reviewed history from 06/18/2007 and no  changes required. Retired Married Never Smoked Alcohol use-no Drug use-no Regular exercise-yes  Review of Systems      See HPI  Physical Exam  General:  Well-developed,well-nourished,in no acute distress; alert,appropriate and cooperative throughout examination Head:  Normocephalic and atraumatic without obvious abnormalities. No apparent alopecia or balding. Eyes:  No corneal or conjunctival inflammation noted. EOMI. Perrla. Funduscopic exam benign, without hemorrhages, exudates or papilledema. Vision grossly normal. Ears:  External ear exam shows no significant lesions or deformities.  Otoscopic examination reveals clear canals, tympanic membranes are intact bilaterally without bulging, retraction, inflammation or discharge. Hearing is grossly normal bilaterally. Nose:  septum in the midline, and 3+ nasal edema Mouth:  Oral mucosa and oropharynx without lesions or exudates.  Teeth in good repair. Neck:  No deformities, masses, or tenderness noted. Lungs:  Normal respiratory effort, chest expands symmetrically. Lungs are clear to auscultation, no crackles or wheezes.   Impression & Recommendations:  Problem # 1:  HYPOTHYROIDISM (ICD-244.9) Assessment Deteriorated  Her updated medication list for this problem includes:    Levoxyl 75 Mcg Tabs (Levothyroxine sodium) ..... Once daily  Orders: Prescription Created Electronically [id number]) Venipuncture [phone]) TLB-TSH (Thyroid Stimulating Hormone) (84443-TSH) TLB-T4 (Thyrox), Free [phone]) TLB-T3, Free (Triiodothyronine) (84481-T3FREE)  Problem # 2:  HEALTH SCREENING (ICD-V70.0) Assessment: Deteriorated  Complete Medication List: 1)  Levoxyl 75 Mcg Tabs (Levothyroxine sodium) .... Once daily 2)  Tenormin 50 Mg Tabs (Atenolol) .... 1/2  am 3)  Prednisone 20 Mg Tabs (Prednisone) .... Uad 4)  Calcium-vitamin D 250-125 Mg-unit Tabs (Calcium carbonate-vitamin d) .Marland Kitchen.. 1 by mouth once daily 5)  Multivitamins Tabs (Multiple  vitamin) .Marland Kitchen.. 1 by mouth once  daily 6)  Flonase 50 Mcg/act Susp (Fluticasone propionate) .... Uad  Patient Instructions: 1)  take plain, 10 mg of Zyrtec at bedtime, along with Afrin nasal spray, saline nasal irrigation netti with a pot, then one shot of steroid nasal spray up each nostril prior to bedtime. 2)  Call the general surgeon office have them send me a copy of the records, so I can try to determine what happened with the medication in October Prescriptions: FLONASE 50 MCG/ACT SUSP (FLUTICASONE PROPIONATE) UAD  #3 x 3   Entered and Authorized by:   Roderick Pee MD   Signed by:   Roderick Pee MD on 10/19/2009   Method used:   Electronically to        [facility] (retail)       [address]       [place], [place]  [address]       Ph: [phone]       Fax: [phone]   RxID:   [id number]

## 2010-09-18 NOTE — Progress Notes (Signed)
Summary: TSH  Phone Note Call from Patient   Caller: Patient [phone] Summary of Call: Pt called requesting MD review and Rx for elevated TSH done at [facility] ***see previous note*** Initial call taken by: Margaret Pyle, CMA,  May 29, 2010 1:16 PM  Follow-up for Phone Call        please have pt resume whole tab of levoxyl once daily  - will recheck TSH in 6 weeks to monitor effect and consider endo eval if still not right - thanks Follow-up by: Newt Lukes MD,  May 29, 2010 2:05 PM  Additional Follow-up for Phone Call Additional follow up Details #1::        Pt informed, labs sch Additional Follow-up by: Margaret Pyle, CMA,  May 29, 2010 2:20 PM    New/Updated Medications: LEVOXYL 75 MCG  TABS (LEVOTHYROXINE SODIUM) 1 by mouth once daily

## 2010-11-05 ENCOUNTER — Encounter: Payer: Self-pay | Admitting: Internal Medicine

## 2010-11-05 ENCOUNTER — Other Ambulatory Visit: Payer: Medicare Other

## 2010-11-05 ENCOUNTER — Ambulatory Visit (INDEPENDENT_AMBULATORY_CARE_PROVIDER_SITE_OTHER): Payer: Medicare Other | Admitting: Internal Medicine

## 2010-11-05 ENCOUNTER — Other Ambulatory Visit: Payer: Self-pay | Admitting: Internal Medicine

## 2010-11-05 DIAGNOSIS — M549 Dorsalgia, unspecified: Secondary | ICD-10-CM

## 2010-11-05 DIAGNOSIS — R079 Chest pain, unspecified: Secondary | ICD-10-CM | POA: Insufficient documentation

## 2010-11-05 DIAGNOSIS — E039 Hypothyroidism, unspecified: Secondary | ICD-10-CM

## 2010-11-05 DIAGNOSIS — Z79899 Other long term (current) drug therapy: Secondary | ICD-10-CM

## 2010-11-05 LAB — CREATININE, SERUM: Creatinine, Ser: 0.9 mg/dL (ref 0.4–1.2)

## 2010-11-05 LAB — BUN: BUN: 24 mg/dL — ABNORMAL HIGH (ref 6–23)

## 2010-11-07 ENCOUNTER — Other Ambulatory Visit: Payer: Self-pay | Admitting: Internal Medicine

## 2010-11-07 DIAGNOSIS — R0602 Shortness of breath: Secondary | ICD-10-CM

## 2010-11-08 ENCOUNTER — Ambulatory Visit (INDEPENDENT_AMBULATORY_CARE_PROVIDER_SITE_OTHER)
Admission: RE | Admit: 2010-11-08 | Discharge: 2010-11-08 | Disposition: A | Discharge status: Home / Self Care | Payer: Medicare Other | Source: Ambulatory Visit | Attending: Internal Medicine | Admitting: Internal Medicine

## 2010-11-08 ENCOUNTER — Telehealth: Payer: Self-pay | Admitting: Internal Medicine

## 2010-11-08 DIAGNOSIS — R079 Chest pain, unspecified: Secondary | ICD-10-CM

## 2010-11-08 DIAGNOSIS — R0602 Shortness of breath: Secondary | ICD-10-CM

## 2010-11-08 DIAGNOSIS — R918 Other nonspecific abnormal finding of lung field: Secondary | ICD-10-CM

## 2010-11-08 MED ORDER — IOHEXOL 300 MG/ML  SOLN
80.0000 mL | Freq: Once | INTRAMUSCULAR | Status: AC | PRN
  Administered 2010-11-08: 80 mL via INTRAVENOUS

## 2010-11-08 NOTE — Telephone Encounter (Signed)
Please call patient - no change on CT lungs from 11/201 scan but there is scarring at base of right lung. Because of her breathing symptoms i will refer to pulm for consultation and further eval/tx if needed. No medication changes recommended at this time. Thanks.

## 2010-11-08 NOTE — Telephone Encounter (Signed)
Pt Notified with CT results and md recommendations...11/08/10@4 ;35pm/LMB

## 2010-11-15 NOTE — Assessment & Plan Note (Signed)
Summary: pain in mid back/#/cd   Vital Signs:  Patient profile:   70 year old female Weight:      122.5 pounds (55.68 kg) BMI:     23.23 O2 Sat:      97 % on Room air Temp:     97.5 degrees F (36.39 degrees C) oral Pulse rate:   57 / minute BP sitting:   100 / 62  (left arm) Cuff size:   regular  Vitals Entered By: Orlan Leavens RMA (November 05, 2010 8:52 AM)  O2 Flow:  Room air CC: Pain in mid back Is Patient Diabetic? No Pain Assessment Patient in pain? yes     Location: Mid back Type: aching Onset of pain  Pt states been  having pain in middleof her back. Diff breathing at times since August, but on Thurs she did have some chest pain on (L) side, and under arms. Has not had any chest pain since   Primary Care Provider:  Newt Lukes, MD  CC:  Pain in mid back.  History of Present Illness: c/o pain in mid back -  onset 03/2010- similar to neck pain from DDD but located below B shoulder blades - intermittent pain - usually with activity like bending to wash dishes or laundry occ a/w SOB - ?hx emphysema on cxr with xrt for breast ca 2001 no cough, no fever, no leg swelling  review other chronic med issues  1) hypothyroid - reports compliance with ongoing medical treatment; changes in medication dose 10/2009 reviewed. denies adverse side effects related to current therapy. no weight or skin changes but fatigue and poor sleep.  2) neck pain - reports hx "bad neck on xray" years ago - increasing tightness without radiating pain across back of neck L>R side - not improved with massage - discomfort most when lying down at night - no weakness, no balance problems or trouble walking - no arm numbness or grip weakenss  3) palpitations - take atenolol low dose for same - generally controls heart symptoms without dizziness or low BP - no racing or sustained palp - worse with cholcolate and caffiene use  4) remote hx breast ca - no recurrence s/p chemo and lumpectomy in 2001  related to same  Clinical Review Panels:  CBC   WBC:  7.2 (12/18/2009)   RBC:  4.56 (12/18/2009)   Hgb:  14.2 (12/18/2009)   Hct:  40.7 (12/18/2009)   Platelets:  184.0 (12/18/2009)   MCV  89.3 (12/18/2009)   MCHC  34.8 (12/18/2009)   RDW  12.7 (12/18/2009)   PMN:  58.7 (12/18/2009)   Lymphs:  27.9 (12/18/2009)   Monos:  9.3 (12/18/2009)   Eosinophils:  3.6 (12/18/2009)   Basophil:  0.5 (12/18/2009)  Complete Metabolic Panel   Glucose:  88 (12/18/2009)   Sodium:  142 (12/18/2009)   Potassium:  4.4 (12/18/2009)   Chloride:  107 (12/18/2009)   CO2:  30 (12/18/2009)   BUN:  21 (12/18/2009)   Creatinine:  0.8 (12/18/2009)   Albumin:  4.3 (12/18/2009)   Total Protein:  7.0 (12/18/2009)   Calcium:  9.9 (12/18/2009)   Total Bili:  0.6 (12/18/2009)   Alk Phos:  67 (12/18/2009)   SGPT (ALT):  17 (12/18/2009)   SGOT (AST):  19 (12/18/2009)   Current Medications (verified): 1)  Levoxyl 75 Mcg  Tabs (Levothyroxine Sodium) .Marland Kitchen.. 1 By Mouth Once Daily 2)  Tenormin 25 Mg Tabs (Atenolol) .Marland Kitchen.. 1 By Mouth Once Daily 3)  Calcium + D + K 750-500-40 Mg-Unt-Mcg Tabs (Calcium-Vitamin D-Vitamin K) .Marland Kitchen.. 1 By Mouth Two Times A Day 4)  Multivitamins  Tabs (Multiple Vitamin) .Marland Kitchen.. 1 By Mouth Once Daily  Allergies (verified): No Known Drug Allergies  Past History:  Past Medical History: Allergic rhinitis Breast cancer - left,  2000 - lumpectomy, chemo, XRT Hyperthyroidism Hypothyroidism   history of BC and squamous cell carcinoma   MD roster: GI Juanda Chance gyn - Cousins gen surg - rosenbower  Review of Systems  The patient denies fever, weight loss, and headaches.    Physical Exam  General:  alert, well-developed, well-nourished, and cooperative to examination.    Lungs:  normal respiratory effort, no intercostal retractions or use of accessory muscles; normal breath sounds bilaterally - no crackles and no wheezes.    Heart:  normal rate, regular rhythm, no murmur, and no rub. BLE  without edema. Msk:  back: full range of motion of thoracic and lumbar spine. Nontender to palpation. Deep tendon reflexes symmetrically intact Full strength to manual muscle testing in all major groups.  walks without difficulty and ambulates with a normal gait.  +myofascial spasm below each inner scapula    Impression & Recommendations:  Problem # 1:  CHEST PAIN (ICD-786.50) ekg w/o change from 12/2009- no ischemic changes or arrythias - suspect related to muscle spasm and chronic ddd issues - check ct chest given xrt and ca hx - ?hx emphysema on prior cxr Orders: EKG w/ Interpretation (93000) Misc. Referral (Misc. Ref)  Problem # 2:  BACK PAIN (ICD-724.5)  The following medications were removed from the medication list:    Robaxin 500 Mg Tabs (Methocarbamol) .Marland Kitchen... 1 by mouth every 8hours as needed for muscle spasm and neck pain Her updated medication list for this problem includes:    Flexeril 5 Mg Tabs (Cyclobenzaprine hcl) .Marland Kitchen... 1 by mouth three times a day as needed needed for muscle spams and pain  Orders: Prescription Created Electronically [id number]) Misc. Referral (Misc. Ref)  Problem # 3:  HYPOTHYROIDISM (ICD-244.9)  Her updated medication list for this problem includes:    Levoxyl 75 Mcg Tabs (Levothyroxine sodium) .Marland Kitchen... 1 by mouth once daily  Orders: TLB-TSH (Thyroid Stimulating Hormone) (84443-TSH)  Labs Reviewed: TSH: 1.16 (07/16/2010)    Chol: 197 (12/18/2009)   HDL: 44.60 (12/18/2009)   LDL: 133 (12/18/2009)   TG: 96.0 (12/18/2009)  Complete Medication List: 1)  Levoxyl 75 Mcg Tabs (Levothyroxine sodium) .Marland Kitchen.. 1 by mouth once daily 2)  Tenormin 25 Mg Tabs (Atenolol) .Marland Kitchen.. 1 by mouth once daily 3)  Calcium + D + K 750-500-40 Mg-unt-mcg Tabs (Calcium-vitamin d-vitamin k) .Marland Kitchen.. 1 by mouth two times a day 4)  Multivitamins Tabs (Multiple vitamin) .Marland Kitchen.. 1 by mouth once daily 5)  Flexeril 5 Mg Tabs (Cyclobenzaprine hcl) .Marland Kitchen.. 1 by mouth three times a day as needed needed  for muscle spams and pain  Other Orders: TLB-Creatinine, Blood (82565-CREA) TLB-BUN (Urea Nitrogen) (84520-BUN)  Patient Instructions: 1)  it was good to see you today. 2)  test(s) ordered today - your results will be called to you after review in 48-72 hours from the time of test completion 3)  we'll make referral for CT scan of lungs. Our office will contact you regarding this appointment once made.  Then you will be called with results once reviewed 4)  use flexeril for muscle spasm pain in back - your prescription has been electronically submitted to your pharmacy. Please take as directed. Contact our office if  you believe you're having problems with the medication(s).  5)  Please schedule a follow-up appointment in 6 months to review thyroid, call sooner if problems.  6)  try simply sleep or benadryl OTC for sleep aide - call if unimporved to consider rx therapy Prescriptions: FLEXERIL 5 MG TABS (CYCLOBENZAPRINE HCL) 1 by mouth three times a day as needed needed for muscle spams and pain  #30 x 0   Entered and Authorized by:   Newt Lukes MD   Signed by:   Newt Lukes MD on 11/05/2010   Method used:   Electronically to        [facility] (retail)       [address]       [place], [place]  [address]       Ph: [phone]       Fax: [phone]   RxID:   [id number]    Orders Added: 1)  EKG w/ Interpretation [93000] 2)  TLB-TSH (Thyroid Stimulating Hormone) [84443-TSH] 3)  TLB-Creatinine, Blood [82565-CREA] 4)  TLB-BUN (Urea Nitrogen) [84520-BUN] 5)  Est. Patient Level IV [56433] 6)  Prescription Created Electronically [[id number]] 7)  Misc. Referral [Misc. Ref]

## 2010-11-22 LAB — COMPREHENSIVE METABOLIC PANEL
ALT: 18 U/L (ref 0–35)
AST: 22 U/L (ref 0–37)
Albumin: 3.9 g/dL (ref 3.5–5.2)
Chloride: 102 mEq/L (ref 96–112)
Creatinine, Ser: 0.81 mg/dL (ref 0.4–1.2)
GFR calc Af Amer: >60 mL/min (ref >60)
Sodium: 137 mEq/L (ref 135–145)
Total Bilirubin: 0.6 mg/dL (ref 0.3–1.2)

## 2010-11-22 LAB — DIFFERENTIAL
Basophils Absolute: 0 10*3/uL (ref 0.0–0.1)
Eosinophils Absolute: 0.1 10*3/uL (ref 0.0–0.7)
Eosinophils Relative: 2 % (ref 0–5)
Lymphocytes Relative: 31 % (ref 12–46)
Lymphs Abs: 2 10*3/uL (ref 0.7–4.0)
Monocytes Absolute: 0.7 10*3/uL (ref 0.1–1.0)

## 2010-11-22 LAB — CBC
MCV: 89.7 fL (ref 78.0–100.0)
Platelets: 176 10*3/uL (ref 150–400)
RBC: 4.74 MIL/uL (ref 3.87–5.11)
WBC: 6.4 10*3/uL (ref 4.0–10.5)

## 2010-11-29 ENCOUNTER — Institutional Professional Consult (permissible substitution): Payer: Medicare Other | Admitting: Internal Medicine

## 2010-12-03 ENCOUNTER — Encounter: Payer: Self-pay | Admitting: Internal Medicine

## 2010-12-03 ENCOUNTER — Ambulatory Visit (INDEPENDENT_AMBULATORY_CARE_PROVIDER_SITE_OTHER): Payer: Medicare Other | Admitting: Internal Medicine

## 2010-12-03 VITALS — BP 90/60 | HR 60 | Temp 98.0°F | Ht 62.0 in | Wt 123.0 lb

## 2010-12-03 DIAGNOSIS — J309 Allergic rhinitis, unspecified: Secondary | ICD-10-CM

## 2010-12-03 DIAGNOSIS — R091 Pleurisy: Secondary | ICD-10-CM

## 2010-12-03 DIAGNOSIS — J929 Pleural plaque without asbestos: Secondary | ICD-10-CM | POA: Insufficient documentation

## 2010-12-03 NOTE — Progress Notes (Signed)
  Subjective:    Patient ID: Emily [name], female    DOB: [birth year], 70 y.o.   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   SOB most likely with exertion but no loss in capacity persisent sometimes at rest with sudden impulse to take a deep breath, never sleeping.  When sob with activity feels feels better resting.     Pt denies any significant sore throat, dysphagia, itching, sneezing,  nasal congestion or excess/ purulent secretions,  fever, chills, sweats, unintended wt loss, pleuritic or exertional cp, hempoptysis, orthopnea pnd or leg swelling.    Also denies any obvious fluctuation of symptoms with weather or environmental changes or other aggravating or alleviating factors.      Review of Systems  Constitutional: Negative for fever, chills and unexpected weight change.  HENT: Negative for ear pain, nosebleeds, congestion, sore throat, rhinorrhea, sneezing, trouble swallowing, dental problem, voice change, postnasal drip and sinus pressure.   Eyes: Negative for visual disturbance.  Respiratory: Positive for shortness of breath. Negative for cough and choking.   Cardiovascular: Negative for chest pain and leg swelling.  Gastrointestinal: Negative for vomiting, abdominal pain and diarrhea.  Genitourinary: Negative for difficulty urinating.  Musculoskeletal: Negative for arthralgias.  Skin: Negative for rash.  Neurological: Negative for tremors, syncope and headaches.    Hematological: Does not bruise/bleed easily.       Objective:   Physical Exam    pleasant amb wf nad wt 123 12/03/2010  HEENT: nl dentition, turbinates, and orophanx. Nl external ear canals without cough reflex   NECK :  without JVD/Nodes/TM/ nl carotid upstrokes bilaterally   LUNGS: no acc muscle use, clear to A and P bilaterally without cough on insp or exp maneuvers   CV:  RRR  no s3 or murmur or increase in P2, no edema   ABD:  soft and nontender with nl excursion in the supine position. No bruits or organomegaly, bowel sounds nl  MS:  warm without deformities, calf tenderness, cyanosis or clubbing  SKIN: warm and dry without lesions    NEURO:  alert, approp, no deficits     CT chest reviewed from 11/08/10 c/w asbestos plaques R > L lower lobes>> upper Assessment & Plan:

## 2010-12-03 NOTE — Assessment & Plan Note (Addendum)
-   CT Chest 11/08/10 classic changes of pleural plaques secondary to asbestos exp last at age 70  Discussed implications for risk of lung ca and ild but does not appear to have classic asbestosis at this point - also discussed this would not cause intermittent sob or probably even effect ex tol so eval for sinus dz/ asthma and rx with GERD diet pending return here for pft's  See instructions for specific recommendations which were reviewed directly with the patient who was given a copy with highlighter outlining the key components.

## 2010-12-03 NOTE — Assessment & Plan Note (Signed)
Assoc with intermittent sob so needs sinus CT baseline at this point

## 2010-12-03 NOTE — Patient Instructions (Signed)
Please see patient coordinator before you leave today  to schedule sinus ct  GERD (REFLUX)  is an extremely common cause of respiratory symptoms, many times with no significant heartburn at all.    It can be treated with medication, but also with lifestyle changes including avoidance of late meals, excessive alcohol, smoking cessation, and avoid fatty foods, chocolate, peppermint, colas, red wine, and acidic juices such as orange juice.  NO MINT OR MENTHOL PRODUCTS SO NO COUGH DROPS  USE SUGARLESS CANDY INSTEAD (jolley ranchers or Stover's)  NO OIL BASED VITAMINS   Please schedule a follow up office visit in 6 weeks, call sooner if needed with PFT's on return  The best medication for drainage is chlortrimeton 4 mg one every 6 hours as needed.

## 2010-12-04 ENCOUNTER — Ambulatory Visit (INDEPENDENT_AMBULATORY_CARE_PROVIDER_SITE_OTHER)
Admission: RE | Admit: 2010-12-04 | Discharge: 2010-12-04 | Disposition: A | Discharge status: Home / Self Care | Payer: Medicare Other | Source: Ambulatory Visit | Attending: Internal Medicine | Admitting: Internal Medicine

## 2010-12-04 DIAGNOSIS — J309 Allergic rhinitis, unspecified: Secondary | ICD-10-CM

## 2010-12-06 NOTE — Progress Notes (Signed)
Quick Note:  Spoke with pt and notified of results per Dr. Wert. Pt verbalized understanding and denied any questions.  ______ 

## 2010-12-20 ENCOUNTER — Other Ambulatory Visit: Payer: Self-pay | Admitting: *Deleted

## 2010-12-20 MED ORDER — ATENOLOL 25 MG PO TABS: 25.0000 mg | ORAL_TABLET | Freq: Every day | ORAL | Status: DC

## 2011-01-01 NOTE — Assessment & Plan Note (Signed)
[facility]                                 ON-CALL NOTE   NAME:[name], Emily                         MRN:          [id number]  DATE:06/20/2007                            DOB:          [birth year]    TIME:  12:57 p.m.   PHONE NUMBER:  [phone].   OBJECTIVE:  Patient has fever, right arm is swollen and she has received  3 injections in that arm due to problems with the other arm.  Called in  yesterday and was told to take Tylenol.  Today I would say she ought to  continue to take the Tylenol.  If she has no fever at the present time,  I would ice it 20 minutes at a time every hour and follow up either in  the ER or at the office if things worsen. If the symptoms get bad where  she cannot tolerate them go in then to be seen.  Elevation of the arm  should also help.   PRIMARY CARE Janiah Devinney:  Dr. Tawanna Cooler.   HOME OFFICE:  [place].     Arta Silence, MD  Electronically Signed    RNS/MedQ  DD: 06/20/2007  DT: 06/21/2007  Job #: [id number]

## 2011-01-01 NOTE — Assessment & Plan Note (Signed)
[facility]                         GASTROENTEROLOGY OFFICE NOTE   TAMMA, BRIGANDI                   MRN:          [id number]  DATE:06/17/2007                            DOB:          14-Feb-1941    Ms. Emily [name] is a very nice 70 year old white female who moved here from  [place].  She has a history of breast cancer, and was recently found to  have cholelithiasis.  She had a colonoscopy in [place] in 2003 which was  essentially normal except for scattered diverticula and suboptimal  visualization of the right colon with cecum not being visualized.  She  developed acute right upper quadrant abdominal pain 24 hours ago.  It  was 8 out of 10.  There was no fever, but it was associated with nausea  and some regurgitation of bile and acid.  Since then, the pain has  subsided but came back last night again.  It woke her up and was about a  5 out of 10.  She took 2 Tylenol.  She has eaten only liquids.  She  denies dark urine or yellow skin.  Ultrasound of the gallbladder in  October 06, 2006 showed a 7 mm stone with motion, but no shadowing.  Wall thickness of the gallbladder was 1.7 mm and common bile duct of 3.1  mm.  The patient has been having occasional dyspepsia but never had a  similar pain before.   MEDICATIONS:  1. Levoxyl 75 mcg daily.  2. Tenormin 50 mg half tablet b.i.d.  3. Multiple vitamins.  4. Pepcid 40 mg p.o. daily.   PHYSICAL EXAMINATION:  Blood pressure 108/52, pulse 62 and weight 125  pounds.  She was alert, oriented, no distress.  She had a cold, somewhat raspy voice, but lungs were clear.  There was  no cervical lymphadenopathy.  COR:  Quiet precordium.  Normal S1, normal S2.  ABDOMEN:  Soft, but definitely tender in the right upper quadrant,  especially when palpating over the liver ridge was normal size.  Splenic  tip was not palpable.  Lower abdomen was unremarkable.  There was no  distension.  Bowel sounds were  somewhat hyperactive.   IMPRESSION:  A 70 year old white female with acute right upper quadrant  abdominal pain and known cholelithiasis.  Her symptoms are consistent  with symptomatic gallbladder disease.  Since the pain has returned last  night, one has to question the possibility of retained common bile duct  stone.   PLAN:  1. Liver function tests, amylase, lipase now.  2. Stay on liquids and low-fat diet.  3. HIDA scan without CCK.  4. Ultram 50 mg p.r.n. abdominal pain.  5. The patient would prefer not to have surgery at this time if the      attack subsides spontaneously.  If the HIDA scan is positive, I      think we have more reason to proceed with cholecystectomy.  6. Z-Pak for upper respiratory infection.  7. Continue all other medications.     Hedwig Morton. Juanda Chance, MD  Electronically Signed    DMB/MedQ  DD: 06/17/2007  DT: 06/17/2007  Job #: [id number]   cc:   Eugenio Hoes. Tawanna Cooler, MD

## 2011-01-04 NOTE — Assessment & Plan Note (Signed)
[name]                         GASTROENTEROLOGY OFFICE NOTE   Emily [name], Emily [name]                   MRN:          [id number]  DATE:11/17/2006                            DOB:          Sep 18, 1940    Emily [name] is a very nice 70 year old white female with a history of  breast CA, estrogen receptor negative, followed by Dr. Mancel Bale,  last appointment October, 2007.  We have been evaluating her for  dyspepsia.  Last colonoscopy in August, 2003, in [place].  Recent  ultrasound of the gallbladder in February showed single small gallstone  which was mobile, normal gallbladder wall and normal common bile duct.  Patient is asymptomatic.  She is currently free of any dyspepsia after  she has modified her diet to low father after she took some Probiotics  and Pepcid 40 mg for about a week.  She no longer has to take Pepcid.  Her H. pylori antibody was negative.   Blood pressure 108/52, pulse 62 and weight 125 pounds.  Patient has no  distress.  She was not re-examined today.   IMPRESSION:  75. A 70 year old white female with asymptomatic cholelithiasis.  2. Nonspecific dyspepsia, resolved.  3. Recall colonoscopy scheduled for August, 2010.  Last colonoscopy      showed poor visualization of the cecal pouch.  4. History of breast carcinoma, followed by Dr. Truett Perna.  Patient is      uncertain as to the frequency of the followup with Dr. Truett Perna.      She is supposed to drop off her pathology reports from [place] and      she thinks that she may need an MRI this coming year; Dr. Truett Perna      will probably have to address that issue with the patient.   I will see her on a p.r.n. basis.     Hedwig Morton. Juanda Chance, MD  Electronically Signed    DMB/MedQ  DD: 11/17/2006  DT: 11/17/2006  Job #: [id number]   cc:   Tinnie Gens A. Tawanna Cooler, MD  Leighton Roach Truett Perna, M.D.

## 2011-01-04 NOTE — Assessment & Plan Note (Signed)
[facility]                         GASTROENTEROLOGY OFFICE NOTE   [name], [name]                   MRN:          [id number]  DATE:09/30/2006                            DOB:          1941-02-28    Emily [name] is a very nice 70 year old white female who has moved from  [place] to [place] in the last 6 months and has established with Dr.  Tawanna [name].  She is here today for evaluation of mild dyspepsia, which occurs  episodically every week or every other week lasting less than a day.  It  has occurred about 5 to 6 times since Christmas.  There is some nausea,  gas, and bloating.  The symptoms usually are precipitated by foods.  There has been occasional diarrhea.  Her weight has been stable.  [name] has a history of breast CA diagnosed in 2001, treated with  chemotherapy and radiation, and lumpectomy in the left breast.  She has  been followed here by Dr. Truett [name] and the followup is planned to be  followed by MRI due for next year, and a yearly mammogram.   MEDICATIONS:  1. Levoxyl 75 mcg daily.  2. Tenormin 25 mg b.i.d. for palpitations.   PAST HISTORY:  Significant for endometriosis status post laparoscopy and  ablation of the endometriosis.  Breast cancer in 2001, palpitations.   FAMILY HISTORY:  Positive for heart disease in her father.  A family  history of colon polyps.  She has personally had colonoscopy in 2001,  which was normal.   SOCIAL HISTORY:  She is married.  Stays at home.  Has 4 children.  Does  not smoke.  Does not drink alcohol.   REVIEW OF SYSTEMS:  Positive for contact lenses and back pain.   PHYSICAL EXAM:  Blood pressure 102/62, pulse 64, weight 123 pounds.  She was alert and oriented, in no acute distress.  LUNGS:  Clear to auscultation.  NECK:  Supple.  No adenopathy.  COR:  Quiet precordium.  S1, S2.  ABDOMEN:  Soft, nontender.  Normoactive bowel sounds.  Somewhat  hypoactive in the lower abdomen.  No  tenderness.  Well-healed suprapubic  horizontal scar.  RECTAL:  Normal rectal tone with hard Hemoccult-negative stool.   IMPRESSION:  54. A 70 year old white female with nonspecific dyspepsia, which occurs      rather intermittently.  Rule out bacterial overgrowth, rule out      gastropathy due to Helicobacter pylori or hyperacidity.  Rule out      biliary dysfunction although there is no family history of      gallbladder problems.  2. History of breast cancer status post chemotherapy and radiation,      currently no active disease, as per Dr. Truett [name].  3. Normal colonoscopy in 2001.   PLAN:  1. Trial of probiotics, either Activa yogurt or Align 1 a day samples      given.  2. If no improvement, try Pepcid over-the-counter 40 mg daily for a      week, then p.r.n.  3. Today, we will check H. pylori antibodies.  4. Upper abdominal ultrasound.  5. If no improvement in the next couple of months, consider upper      endoscopy and further evaluation, or possibly using PPI rather than      Pepcid.     Hedwig Morton. Juanda Chance, MD  Electronically Signed    DMB/MedQ  DD: 09/30/2006  DT: 09/30/2006  Job #: [id number]   cc:   Emily Gens A. Tawanna Cooler, MD  Emily [name], M.D.

## 2011-01-08 ENCOUNTER — Encounter: Payer: Self-pay | Admitting: Internal Medicine

## 2011-01-15 ENCOUNTER — Ambulatory Visit (INDEPENDENT_AMBULATORY_CARE_PROVIDER_SITE_OTHER): Payer: Medicare Other | Admitting: Internal Medicine

## 2011-01-15 ENCOUNTER — Encounter: Payer: Self-pay | Admitting: Internal Medicine

## 2011-01-15 DIAGNOSIS — J309 Allergic rhinitis, unspecified: Secondary | ICD-10-CM

## 2011-01-15 DIAGNOSIS — J929 Pleural plaque without asbestos: Secondary | ICD-10-CM

## 2011-01-15 DIAGNOSIS — R079 Chest pain, unspecified: Secondary | ICD-10-CM

## 2011-01-15 DIAGNOSIS — R05 Cough: Secondary | ICD-10-CM

## 2011-01-15 DIAGNOSIS — R091 Pleurisy: Secondary | ICD-10-CM

## 2011-01-15 DIAGNOSIS — R059 Cough, unspecified: Secondary | ICD-10-CM

## 2011-01-15 LAB — PULMONARY FUNCTION TEST

## 2011-01-15 NOTE — Progress Notes (Signed)
  Subjective:    Patient ID: Emily [name], female    DOB: [birth year], 70 y.o.   MRN: [id number]  HPI  75 yowf never smoker heavily exposed to asbestos ages 54 -53 by father who developed asbestosis  But she has been healthy x for L breast ca  in 2001 chemo and RT in [place] Stage I lumpectomy followed by Truett Perna last rx 2002   12/03/2010 Initial pulmonary office eval for sob which started w/in a month of stopping RT 2001  saw pulmonary doctor in [address] rx advair x 3 months seemed better  then stopped it  And never had bad enough symptoms of sob to take advair or any resp medication and never recurred   until  Aug 2011 ( despite  developing p arrival [place] 2007  tendencies to sinus infections in October and March)   SOB most likely with exertion but no loss in ex capacity persisent sometimes at rest with sudden impulse to take a deep breath, never sleeping.    Assoc with bilateral post cp but did not directly related to sob in  that exercise brings on shortness of breath not so severe she wants to stop and does not bring on the cp.  Assoc with year round drippy nose> watery discharge. Has it immediately p standing in am  and does not wake her up-  Tried clariton no beneft, tried clortrimeton no benefit, never allergy tested.  Zyrtec helped more than anything else  rec Please see patient coordinator before you leave today to schedule sinus ct > neg GERD (REFLUX) diet The best medication for drainage is chlortrimeton 4 mg one every 6 hours as needed.  01/15/2011 ov/ Wert main complaint is drippy nose. Cp worse washing dishes or ironing not at all with aerobic activities. Pt denies any significant sore throat, dysphagia, itching, sneezing,  nasal congestion or   purulent nasal secretions,  fever, chills, sweats, unintended wt loss, pleuritic or exertional cp, hempoptysis, orthopnea pnd or leg swelling.    Also denies any obvious fluctuation of symptoms with weather or environmental changes or  other aggravating or alleviating factors.                 Objective:   Physical Exam    pleasant amb wf nad wt 123 12/03/2010  HEENT: nl dentition, turbinates, and orophanx. Nl external ear canals without cough reflex   NECK :  without JVD/Nodes/TM/ nl carotid upstrokes bilaterally   LUNGS: no acc muscle use, clear to A and P bilaterally without cough on insp or exp maneuvers   CV:  RRR  no s3 or murmur or increase in P2, no edema   ABD:  soft and nontender with nl excursion in the supine position. No bruits or organomegaly, bowel sounds nl  MS:  warm without deformities, calf tenderness, cyanosis or clubbing  SKIN: warm and dry without lesions    NEURO:  alert, approp, no deficits     CT chest reviewed from 11/08/10 c/w asbestos plaques R > L lower lobes>> upper Assessment & Plan:

## 2011-01-15 NOTE — Patient Instructions (Signed)
Your chest pain is not coming your lungs or heart especially since gets wetting warm moist heat  You definite have asbestos exposure but no evidence of significant asbestosis related dysfunction you are at low (but not zero) risk of developing complications and therefore I recommend yearly cxr  but also early evaluation for a new cough or breathing problem   Try zyrtec for the drippy nose but take it in the am and take Pepcid 20mg  one bedtime to see if this drippy nose issue and if not return here.

## 2011-01-15 NOTE — Progress Notes (Signed)
PFT done today. 

## 2011-01-15 NOTE — Progress Notes (Deleted)
Subjective:     Patient ID: Emily [name], female   DOB: 07-12-1941, 70 y.o.   MRN: [id number]  HPI xx  Review of Systems     Objective:   Physical Exam     Assessment:     ***    Plan:     ***

## 2011-01-16 ENCOUNTER — Encounter: Payer: Self-pay | Admitting: Internal Medicine

## 2011-01-16 NOTE — Assessment & Plan Note (Signed)
Pain is reproducible with certain positions, not pleuritic or related to exercise and chronic.  All this points to [facility], defer further w/u to Dr  Felicity Coyer.

## 2011-01-16 NOTE — Assessment & Plan Note (Signed)
I had an extended discussion with the patient today lasting 15 to 20 minutes of a 25 minute visit on the following issues:   She has nl pulmonary function so no evidence of asbestosis per se but is at risk.  Would do f/u cxr's yearly but no further pft's unless change in ex tolerance

## 2011-01-16 NOTE — Assessment & Plan Note (Signed)
I had an extended discussion with the patient today lasting 15 to 20 minutes of a 25 minute visit on the following issues:  Offered various strategies > she wants first to try zyrtec again then consider trial of singulair and allergy testing although may benefit from trial of astepro or atrovent which would cover the possibility of vasomotor rhintis also.

## 2011-01-18 ENCOUNTER — Encounter: Payer: Self-pay | Admitting: Internal Medicine

## 2011-03-25 ENCOUNTER — Encounter: Payer: Self-pay | Admitting: Internal Medicine

## 2011-04-10 ENCOUNTER — Other Ambulatory Visit: Payer: Self-pay | Admitting: *Deleted

## 2011-04-10 MED ORDER — LEVOTHYROXINE SODIUM 75 MCG PO TABS: 75.0000 ug | ORAL_TABLET | Freq: Every day | ORAL | Status: DC

## 2011-04-12 ENCOUNTER — Other Ambulatory Visit: Payer: Self-pay | Admitting: *Deleted

## 2011-04-12 MED ORDER — LEVOTHYROXINE SODIUM 75 MCG PO TABS: 75.0000 ug | ORAL_TABLET | Freq: Every day | ORAL | Status: DC

## 2011-04-16 ENCOUNTER — Telehealth: Payer: Self-pay

## 2011-04-16 DIAGNOSIS — E039 Hypothyroidism, unspecified: Secondary | ICD-10-CM

## 2011-04-16 NOTE — Telephone Encounter (Signed)
Yes - order placed - thx

## 2011-04-16 NOTE — Telephone Encounter (Signed)
Pt called requesting to have TSH recheck prior to picking up Rx for Levothyroxine. Pt wants to be sure that she is to continue with sam dosage. Ok to order TSH?

## 2011-04-16 NOTE — Telephone Encounter (Signed)
Pt advised via Home VM.

## 2011-04-17 ENCOUNTER — Other Ambulatory Visit (INDEPENDENT_AMBULATORY_CARE_PROVIDER_SITE_OTHER): Payer: Medicare Other

## 2011-04-17 DIAGNOSIS — E039 Hypothyroidism, unspecified: Secondary | ICD-10-CM

## 2011-04-17 LAB — TSH: TSH: 1.6 u[IU]/mL (ref 0.35–5.50)

## 2011-05-20 ENCOUNTER — Ambulatory Visit (INDEPENDENT_AMBULATORY_CARE_PROVIDER_SITE_OTHER): Payer: Medicare Other | Admitting: Internal Medicine

## 2011-05-20 ENCOUNTER — Encounter: Payer: Self-pay | Admitting: Internal Medicine

## 2011-05-20 VITALS — BP 102/72 | HR 67 | Temp 97.5°F | Ht 62.0 in

## 2011-05-20 DIAGNOSIS — Z23 Encounter for immunization: Secondary | ICD-10-CM

## 2011-05-20 DIAGNOSIS — M545 Low back pain, unspecified: Secondary | ICD-10-CM

## 2011-05-20 DIAGNOSIS — E039 Hypothyroidism, unspecified: Secondary | ICD-10-CM

## 2011-05-20 DIAGNOSIS — M5430 Sciatica, unspecified side: Secondary | ICD-10-CM

## 2011-05-20 DIAGNOSIS — M5431 Sciatica, right side: Secondary | ICD-10-CM

## 2011-05-20 MED ORDER — DICLOFENAC SODIUM 75 MG PO TBEC: 75.0000 mg | DELAYED_RELEASE_TABLET | Freq: Two times a day (BID) | ORAL | Status: DC

## 2011-05-20 MED ORDER — CYCLOBENZAPRINE HCL 5 MG PO TABS: 5.0000 mg | ORAL_TABLET | Freq: Three times a day (TID) | ORAL | Status: DC | PRN

## 2011-05-20 NOTE — Patient Instructions (Signed)
It was good to see you today. Voltaren for antiinflammatory medication and renew flexeril for muscle spasm pain - Your prescription(s) have been submitted to your pharmacy. Please take as directed and contact our office if you believe you are having problem(s) with the medication(s). Lumbar Radiculopathy, Sciatica Sciatica is a weakness and/or changes in sensation (tingling, jolts, hot and cold, numbness) along the path the sciatic nerve travels. Irritation or damage to lumbar nerve roots is often also referred to as lumbar radiculopathy.   Lumbar radiculopathy (Sciatica) is the most common form of this problem. Radiculopathy can occur in any of the nerves coming out of the spinal cord. The problems caused depend on which nerves are involved. The sciatic nerve is the large nerve supplying the branches of nerves going from the hip to the toes. It often causes a numbness or weakness in the skin and/or muscles that the sciatic nerve serves. It also may cause symptoms (problems) of pain, burning, tingling, or electric shock-like feelings in the path of this nerve. This usually comes from injury to the fibers that make up the sciatic nerve. Some of these symptoms are low back pain and/or unpleasant feelings in the following areas:  From the mid-buttock down the back of the leg to the back of the knee.   And/or the outside of the calf and top of the foot.   And/or behind the inner ankle to the sole of the foot.  CAUSES  Herniated or slipped disc. Discs are the little cushions between the bones in the back.   Pressure by the piriformis muscle in the buttock on the sciatic nerve (Piriformis Syndrome).   Misalignment of the bones in the lower back and buttocks (Sacroiliac Joint Derangement).   Narrowing of the spinal canal that puts pressure on or pinches the fibers that make up the sciatic nerve.   A slipped vertebra that is out of line with those above or beneath it.   Abnormality of the nervous  system itself so that nerve fibers do not transmit signals properly, especially to feet and calves (neuropathy).   Tumor (this is rare).  Your caregiver can usually determine the cause of your sciatica and begin the treatment most likely to help you. TREATMENT Taking over-the-counter painkillers, physical therapy, rest, exercise, spinal manipulation, and injections of anesthetics and/or steroids may be used. Surgery, acupuncture, and Yoga can also be effective. Mind over matter techniques, mental imagery, and changing factors such as your bed, chair, desk height, posture, and activities are other treatments that may be helpful. You and your caregiver can help determine what is best for you. With proper diagnosis, the cause of most sciatica can be identified and removed. Communication and cooperation between your caregiver and you is essential. If you are not successful immediately, do not be discouraged. With time, a proper treatment can be found that will make you comfortable. HOME CARE INSTRUCTIONS  If the pain is coming from a problem in the back, applying ice to that area for 5-8 minutes, 3-4 times per day while awake, may be helpful. Put the ice in a plastic bag. Place a towel between the bag of ice and your skin.   You may exercise or perform your usual activities if these do not aggravate your pain, or as suggested by your caregiver.   Only take over-the-counter or prescription medicines for pain, discomfort, or fever as directed by your caregiver.   If your caregiver has given you a follow-up appointment, it is very important  to keep that appointment. Not keeping the appointment could result in a chronic or permanent injury, pain, and disability. If there is any problem keeping the appointment, you must call back to this facility for assistance.  SEEK IMMEDIATE MEDICAL CARE IF:  You experience loss of control of bowel or bladder.   You have increasing weakness in the trunk, buttocks, or  legs.   There is numbness in any areas from the hip down to the toes.   You have difficulty walking or keeping your balance.   You have any of the above, with fever or forceful vomiting.  Document Released: 07/30/2001 Document Re-Released: 08/27/2009 [facility] Patient Information 2011 [place], [place].

## 2011-05-20 NOTE — Assessment & Plan Note (Signed)
Lab Results  Component Value Date   TSH 1.60 04/17/2011   The current medical regimen is effective;  continue present plan and medications.

## 2011-05-20 NOTE — Progress Notes (Signed)
Subjective:    Patient ID: Emily [name], female    DOB: 1940/10/27, 70 y.o.   MRN: [id number]  HPI  c/o pain in R lower back -  Onset 3 days ago  Precipitated by moving 02/2011 and overuse in yoga last week Describes as numbness or shooting pain into R buttock No weakness Pain worse with activity like bending to wash dishes or laundry; also worse sitting/lying>>standing/walking  Also review other chronic med issues  hypothyroid - reports compliance with ongoing medical treatment; changes in medication dose 10/2009 reviewed. denies adverse side effects related to current therapy. no weight or skin changes but fatigue and poor sleep.   neck pain - reports hx "bad neck on xray" years ago - increasing tightness without radiating pain across back of neck L>R side - not improved with massage - discomfort most when lying down at night - no weakness, no balance problems or trouble walking - no arm numbness or grip weakenss   palpitations - take atenolol low dose for same - generally controls heart symptoms without dizziness or low BP - no racing or sustained palp - worse with cholcolate and caffiene use   remote hx breast ca - no recurrence s/p chemo and lumpectomy in 2001 related to same   Past Medical History  Diagnosis Date  . Allergic rhinitis   . Squamous cell carcinoma     History of BC  . Heart palpitations   . Actinic keratosis   . BACK PAIN   . CARCINOMA, BREAST, ESTROGEN RECEPTOR NEGATIVE 10/12/2007    L s/p lumpectomy, chemo and XRT  . Cervicalgia   . DIVERTICULOSIS, COLON 03/02/2009  . ENDOMETRIOSIS   . HYPOTHYROIDISM   . Pleural plaque without asbestos     CT chest 10/2010: R>L lobes, upper> lower     Review of Systems  Constitutional: Negative for fever and unexpected weight change.  Respiratory: Negative for cough and shortness of breath.   Cardiovascular: Negative for chest pain and leg swelling.       Objective:   Physical Exam BP 102/72  Pulse 67   Temp(Src) 97.5 F (36.4 C) (Oral)  Ht 5\' 2"  (1.575 m)  SpO2 97% Wt Readings from Last 3 Encounters:  01/15/11 122 lb (55.339 kg)  12/03/10 123 lb (55.792 kg)  11/05/10 122 lb 8 oz (55.566 kg)   Constitutional: She is well-developed and well-nourished. No distress.  Cardiovascular: Normal rate, regular rhythm and normal heart sounds.  No murmur heard. No BLE edema. Pulmonary/Chest: Effort normal and breath sounds normal. No respiratory distress. She has no wheezes.  Abdominal: Soft. Bowel sounds are normal. She exhibits no distension. There is no tenderness. no masses Musculoskeletal: Back: full range of motion of thoracic and lumbar spine. Non tender to palpation. Positive R straight leg raise. DTR's are symmetrically intact. Sensation intact in all dermatomes of the lower extremities. Full strength to manual muscle testing - able to heel toe walk without difficulty and ambulates with normal gait. Skin: Skin is warm and dry. No rash noted. No erythema.  Psychiatric: She has a normal mood and affect. Her behavior is normal. Judgment and thought content normal.   Lab Results  Component Value Date   WBC 7.2 12/18/2009   HGB 14.2 12/18/2009   HCT 40.7 12/18/2009   PLT 184.0 12/18/2009   CHOL 197 12/18/2009   TRIG 96.0 12/18/2009   HDL 44.60 12/18/2009   LDLDIRECT 156.4 06/11/2007   ALT 17 12/18/2009   AST 19 12/18/2009  NA 142 12/18/2009   K 4.4 12/18/2009   CL 107 12/18/2009   CREATININE 0.9 11/05/2010   BUN 24* 11/05/2010   CO2 30 12/18/2009   TSH 1.60 04/17/2011        Assessment & Plan:  R LBP/buttock pain - suspect mild sciatica irritation, no neuro deficits on exam - intol of pred so will tx with NSAIDs and renew muscle relaxants - continue back exercises - to call if worse or unimporved

## 2011-05-23 ENCOUNTER — Telehealth: Payer: Self-pay

## 2011-05-23 DIAGNOSIS — M5431 Sciatica, right side: Secondary | ICD-10-CM

## 2011-05-23 DIAGNOSIS — M545 Low back pain, unspecified: Secondary | ICD-10-CM

## 2011-05-23 MED ORDER — METHOCARBAMOL 500 MG PO TABS: 500.0000 mg | ORAL_TABLET | Freq: Three times a day (TID) | ORAL | Status: AC | PRN

## 2011-05-23 MED ORDER — NAPROXEN 500 MG PO TABS: 500.0000 mg | ORAL_TABLET | Freq: Two times a day (BID) | ORAL | Status: DC

## 2011-05-23 NOTE — Telephone Encounter (Signed)
Pt called stating that medications are causing shaking of the hands and "hyperness" at night. Pt is requesting alterative medications and perhaps a referral to a chiropractor.

## 2011-05-23 NOTE — Telephone Encounter (Signed)
voltaren and flexeril are unlikely culprits but will change each to naprosyn and robaxin (respectively) - erx done Will also refer to PT rather than chiropractor

## 2011-05-23 NOTE — Telephone Encounter (Signed)
Notified pt with md response.....05/23/11@2 :11pm/LMB

## 2011-06-17 ENCOUNTER — Other Ambulatory Visit: Payer: Self-pay | Admitting: Internal Medicine

## 2011-06-17 DIAGNOSIS — Z1231 Encounter for screening mammogram for malignant neoplasm of breast: Secondary | ICD-10-CM

## 2011-07-08 ENCOUNTER — Other Ambulatory Visit: Payer: Self-pay | Admitting: Internal Medicine

## 2011-07-30 ENCOUNTER — Ambulatory Visit
Admission: RE | Admit: 2011-07-30 | Discharge: 2011-07-30 | Disposition: A | Discharge status: Home / Self Care | Payer: Medicare Other | Source: Ambulatory Visit | Attending: Internal Medicine | Admitting: Internal Medicine

## 2011-07-30 DIAGNOSIS — Z1231 Encounter for screening mammogram for malignant neoplasm of breast: Secondary | ICD-10-CM

## 2011-07-31 ENCOUNTER — Other Ambulatory Visit: Payer: Self-pay | Admitting: Internal Medicine

## 2011-07-31 DIAGNOSIS — R928 Other abnormal and inconclusive findings on diagnostic imaging of breast: Secondary | ICD-10-CM

## 2011-08-06 ENCOUNTER — Ambulatory Visit
Admission: RE | Admit: 2011-08-06 | Discharge: 2011-08-06 | Disposition: A | Discharge status: Home / Self Care | Payer: Medicare Other | Source: Ambulatory Visit | Attending: Internal Medicine | Admitting: Internal Medicine

## 2011-08-06 DIAGNOSIS — R928 Other abnormal and inconclusive findings on diagnostic imaging of breast: Secondary | ICD-10-CM

## 2011-09-09 ENCOUNTER — Encounter: Payer: Self-pay | Admitting: *Deleted

## 2011-09-10 ENCOUNTER — Telehealth: Payer: Self-pay | Admitting: *Deleted

## 2011-09-10 DIAGNOSIS — E039 Hypothyroidism, unspecified: Secondary | ICD-10-CM

## 2011-09-10 NOTE — Telephone Encounter (Signed)
Pt states she has been having trouble sleeping, no energy wanting to get TSH check since she hasn't had thyroid check. Let pt know orders in computer,,,,09/10/11@4 :34pm/LMB

## 2011-09-17 ENCOUNTER — Ambulatory Visit (INDEPENDENT_AMBULATORY_CARE_PROVIDER_SITE_OTHER): Payer: Medicare Other | Admitting: Internal Medicine

## 2011-09-17 ENCOUNTER — Ambulatory Visit (INDEPENDENT_AMBULATORY_CARE_PROVIDER_SITE_OTHER)
Admission: RE | Admit: 2011-09-17 | Discharge: 2011-09-17 | Disposition: A | Discharge status: Home / Self Care | Payer: Medicare Other | Source: Ambulatory Visit | Attending: Internal Medicine | Admitting: Internal Medicine

## 2011-09-17 ENCOUNTER — Other Ambulatory Visit (INDEPENDENT_AMBULATORY_CARE_PROVIDER_SITE_OTHER): Payer: Medicare Other

## 2011-09-17 ENCOUNTER — Encounter: Payer: Self-pay | Admitting: Internal Medicine

## 2011-09-17 DIAGNOSIS — R1031 Right lower quadrant pain: Secondary | ICD-10-CM

## 2011-09-17 DIAGNOSIS — E039 Hypothyroidism, unspecified: Secondary | ICD-10-CM

## 2011-09-17 DIAGNOSIS — M549 Dorsalgia, unspecified: Secondary | ICD-10-CM

## 2011-09-17 MED ORDER — DICLOFENAC SODIUM 75 MG PO TBEC: 75.0000 mg | DELAYED_RELEASE_TABLET | Freq: Every day | ORAL | Status: DC

## 2011-09-17 NOTE — Patient Instructions (Signed)
We have given you a prescription for diclofenac to take to your pharmacy. Please go to the basement floor to radiology for your back x-ray. CC: Dr Rene Paci

## 2011-09-17 NOTE — Progress Notes (Signed)
Emily [name] 04/27/1941 MRN [id number]   History of Present Illness:  This is a 71 year old white female with a burning sensation in the right lower and middle quadrant of 2 years duration. The sensation occurs daily but it disappears at night. It is not associated with exercise or with bowel movements which are regular. She has a history of breast cancer. She also has a known history of degenerative joint disease of the neck and the lumbosacral spine. Her colonoscopy in August 2010 showed a tortuous colon with mild diverticulosis. The cecal pouch was not visualized. A colonoscopy in 2003 was normal. Her weight has been stable. She underwent a laparoscopic cholecystectomy in October 2004 for cholelithiasis. It was an uncomplicated operation. Another issue has been a small skin tag at the rectal area which seems to be growing in size.   Past Medical History  Diagnosis Date  . Allergic rhinitis   . Squamous cell carcinoma     History of BC  . Heart palpitations   . Actinic keratosis   . BACK PAIN   . CARCINOMA, BREAST, ESTROGEN RECEPTOR NEGATIVE 10/12/2007    L s/p lumpectomy, chemo and XRT  . Cervicalgia   . DIVERTICULOSIS, COLON 03/02/2009  . ENDOMETRIOSIS   . HYPOTHYROIDISM   . Pleural plaque without asbestos     CT chest 10/2010: R>L lobes, upper> lower  . Cholelithiasis   . Hypertension    Past Surgical History  Procedure Date  . Cholecystectomy 2010  . Endometrial ablation   . Breast lumpectomy 2001    left  . Tubal ligation     reports that she has never smoked. She has never used smokeless tobacco. She reports that she does not drink alcohol or use illicit drugs. family history includes Colon polyps in an unspecified family member; Coronary artery disease in an unspecified family member; Emphysema in her father; and Heart disease in her father.  There is no history of Colon cancer. Allergies  Allergen Reactions  . Prednisone Swelling and Rash        Review of  Systems: Denies dysphagia heartburn or shortness of breath or chest pain.  The remainder of the 10 point ROS is negative except as outlined in H&P   Physical Exam: General appearance  Well developed, in no distress. Eyes- non icteric. HEENT nontraumatic, normocephalic. Mouth no lesions, tongue papillated, no cheilosis. Neck supple without adenopathy, thyroid not enlarged, no carotid bruits, no JVD. Lungs Clear to auscultation bilaterally. Cor normal S1, normal S2, regular rhythm, no murmur,  quiet precordium. Abdomen: When standing, there is no asymmetry of the abdominal wall. There is a horizontal scar at the suprapubic area. No evidence of hernia. When laying down, the abdomen is soft and nontender with normal active bowel sounds. No palpable mass. Liver edge at costal margin. Straight leg raising is negative. Sitting up or laying down does not seem to precipitate the pain. Rectal: Small 5 mm simple skin tag in the peri-rectal area about 1 cm from the rectal vault. It does not appear to be a polyp, condyloma or hemorrhoids. It is nontender and does not appear to be inflamed. Extremities no pedal edema. Skin no lesions. Neurological alert and oriented x 3. Psychological normal mood and affect.  Assessment and Plan:  Problem #1 Burning sensation in the right lower quadrant.  I  Don't think this is a GI related problem  possibly an abdominal wall problem or neuralgia from chronic disc problems or from possible shingles. I do not  see any  rash. The discomfort is not associated with any GI function. I would like to give her a trial of diclofenac 75 mg daily for 30 days and obtain an x-ray of the lumbosacral spine today. If the symptoms continue beyond the next 30 days, we will proceed with CT scan of the abdomen and pelvis with attention to the right lower quadrant.  Problem #2 Colorectal screening. She is up-to-date on her colonoscopy. She has a known tortuous colon. She is Hemoccult-negative  today. Her next colonoscopy will be due in 2020.   09/17/2011 [name]

## 2011-09-18 ENCOUNTER — Telehealth: Payer: Self-pay | Admitting: Internal Medicine

## 2011-09-18 NOTE — Telephone Encounter (Signed)
Spoke with patient and gave her the results and recommendations. 

## 2011-09-18 NOTE — Telephone Encounter (Signed)
Message copied by Daphine Deutscher on Wed Sep 18, 2011  2:30 PM ------      Message from: Hart Carwin      Created: Tue Sep 17, 2011  9:38 PM       Please call pt with results of the LS spine which shows only mild degenerative changes, no slipped disc. Moderate DJD at L1-2 level but no disc protrusion. She is supposed to call me in 4 weeks after finishing her Voltaren.to give [place] an update.

## 2011-10-08 ENCOUNTER — Other Ambulatory Visit: Payer: Self-pay | Admitting: *Deleted

## 2011-10-08 ENCOUNTER — Other Ambulatory Visit: Payer: Self-pay

## 2011-10-08 MED ORDER — ATENOLOL 25 MG PO TABS: 25.0000 mg | ORAL_TABLET | Freq: Every day | ORAL | Status: DC

## 2011-10-08 MED ORDER — LEVOTHYROXINE SODIUM 75 MCG PO TABS: 75.0000 ug | ORAL_TABLET | Freq: Every day | ORAL | Status: DC

## 2011-10-14 ENCOUNTER — Telehealth: Payer: Self-pay | Admitting: Internal Medicine

## 2011-10-14 NOTE — Telephone Encounter (Signed)
Spoke with patient and she wants to schedule 4 week f/u OV. Scheduled patient on 10/22/11 at 10:00 AM.

## 2011-10-22 ENCOUNTER — Ambulatory Visit (INDEPENDENT_AMBULATORY_CARE_PROVIDER_SITE_OTHER): Payer: Medicare Other | Admitting: Internal Medicine

## 2011-10-22 ENCOUNTER — Encounter: Payer: Self-pay | Admitting: Internal Medicine

## 2011-10-22 VITALS — BP 108/64 | HR 68 | Ht 62.0 in | Wt 119.0 lb

## 2011-10-22 DIAGNOSIS — R1031 Right lower quadrant pain: Secondary | ICD-10-CM

## 2011-10-22 MED ORDER — DICLOFENAC SODIUM 75 MG PO TBEC: 75.0000 mg | DELAYED_RELEASE_TABLET | Freq: Every day | ORAL | Status: DC

## 2011-10-22 NOTE — Progress Notes (Signed)
Emily [name] 1940-09-21 MRN [id number]    History of Present Illness:  This is a 71 year old white female with right lower quadrant abdominal discomfort and tingling sensation for which I saw her on 09/17/2011 and I thought it was a musculoskeletal pain and ordered an x-ray of the lumbosacral spine which showed mild and moderate degenerative joint disease of the lumbosacral spine. She was started on diclofenac 75 mg daily with complete relief of her symptoms. She discontinued diclofenac and her sensation came back within a week. She took one diclofenac last night and the pain went away. She has a history of breast cancer in 2001. She is status post laparoscopic cholecystectomy in 2004. Her colonoscopy in August 2010 showed a tortuous colon with mild diverticulosis.    Past Medical History  Diagnosis Date  . Allergic rhinitis   . Squamous cell carcinoma     History of BC  . Heart palpitations   . Actinic keratosis   . BACK PAIN   . CARCINOMA, BREAST, ESTROGEN RECEPTOR NEGATIVE 10/12/2007    L s/p lumpectomy, chemo and XRT  . Cervicalgia   . DIVERTICULOSIS, COLON 03/02/2009  . ENDOMETRIOSIS   . HYPOTHYROIDISM   . Pleural plaque without asbestos     CT chest 10/2010: R>L lobes, upper> lower  . Cholelithiasis   . Hypertension    Past Surgical History  Procedure Date  . Cholecystectomy 2010  . Endometrial ablation   . Breast lumpectomy 2001    left  . Tubal ligation     reports that she has never smoked. She has never used smokeless tobacco. She reports that she does not drink alcohol or use illicit drugs. family history includes Emphysema in her father and Heart disease in her father.  There is no history of Colon cancer. Allergies  Allergen Reactions  . Prednisone Swelling and Rash        Review of Systems: Denies dyspepsia change in bowel habits fever shortness of breath  The remainder of the 10 point ROS is negative except as outlined in H&P   Physical  Exam: General appearance  Well developed, in no distress. Eyes- non icteric. HEENT nontraumatic, normocephalic. Mouth no lesions, tongue papillated, no cheilosis. Neck supple without adenopathy, thyroid not enlarged, no carotid bruits, no JVD. Lungs Clear to auscultation bilaterally. Cor normal S1, normal S2, regular rhythm, no murmur,  quiet precordium. Abdomen: Soft nontender abdomen with normoactive bowel sounds. Liver edge at costal margin. No fullness in right lower quadrant. Straight leg raising is negative there is no CVA tenderness. Rectal: Not repeated. Extremities no pedal edema. Skin no lesions. Neurological alert and oriented x 3. Psychological normal mood and affect.  Assessment and Plan:  Problem #1 Right lower quadrant abdominal discomfort relieved by diclofenac, suggesting this is a musculoskeletal pain. She has moderately severe degenerative joint disease of the lumbosacral spine. She is quite concerned about the possibility of recurrent cancer. We will proceed with a CT scan of the abdomen and pelvis with attention to the right lower quadrant. I have given her refills on diclofenac.   10/22/2011 [name]

## 2011-10-22 NOTE — Progress Notes (Signed)
Addended by: Richardson Chiquito on: 10/22/2011 05:30 PM   Modules accepted: Orders

## 2011-10-22 NOTE — Patient Instructions (Signed)
We have sent the following medications to your pharmacy for you to pick up at your convenience: Diclofenac You have been scheduled for a CT scan of the abdomen and pelvis at [facility] ([address]---this is in the same building as Architectural technologist).   You are scheduled on 10/25/11 at 9:00 am. You should arrive 15 minutes prior to your appointment time for registration. Please follow the written instructions below on the day of your exam:  WARNING: IF YOU ARE ALLERGIC TO IODINE/X-RAY DYE, PLEASE NOTIFY RADIOLOGY IMMEDIATELY AT [phone]! YOU WILL BE GIVEN A 13 HOUR PREMEDICATION PREP.  1) Do not eat or drink anything after 5:00 am (4 hours prior to your test) 2) You have been given 2 bottles of oral contrast to drink. The solution may taste better if refrigerated, but do NOT add ice or any other liquid to this solution. Shake well before drinking.    Drink 1 bottle of contrast @ 7:00 am (2 hours prior to your exam)  Drink 1 bottle of contrast @ 8:00 am (1 hour prior to your exam)  You may take any medications as prescribed with a small amount of water except for the following: Metformin, Glucophage, Glucovance, Avandamet, Riomet, Fortamet, Actoplus Met, Janumet, Glumetza or Metaglip. The above medications must be held the day of the exam AND 48 hours after the exam.  The purpose of you drinking the oral contrast is to aid in the visualization of your intestinal tract. The contrast solution may cause some diarrhea. Before your exam is started, you will be given a small amount of fluid to drink. Depending on your individual set of symptoms, you may also receive an intravenous injection of x-ray contrast/dye. Plan on being at [facility] for 30 minutes or long, depending on the type of exam you are having performed.  If you have any questions regarding your exam or if you need to reschedule, you may call the CT department at [phone] between the hours of 8:00 am and  5:00 pm, Monday-Friday.  ________________________________________________________________________ CC:  Dr Felicity Coyer

## 2011-10-23 ENCOUNTER — Other Ambulatory Visit (INDEPENDENT_AMBULATORY_CARE_PROVIDER_SITE_OTHER): Payer: Medicare Other

## 2011-10-23 DIAGNOSIS — R1031 Right lower quadrant pain: Secondary | ICD-10-CM

## 2011-10-23 LAB — CREATININE, SERUM: Creatinine, Ser: 0.9 mg/dL (ref 0.4–1.2)

## 2011-10-25 ENCOUNTER — Ambulatory Visit (INDEPENDENT_AMBULATORY_CARE_PROVIDER_SITE_OTHER)
Admission: RE | Admit: 2011-10-25 | Discharge: 2011-10-25 | Disposition: A | Discharge status: Home / Self Care | Payer: Medicare Other | Source: Ambulatory Visit | Attending: Internal Medicine | Admitting: Internal Medicine

## 2011-10-25 ENCOUNTER — Telehealth: Payer: Self-pay | Admitting: Internal Medicine

## 2011-10-25 DIAGNOSIS — R1031 Right lower quadrant pain: Secondary | ICD-10-CM

## 2011-10-25 MED ORDER — IOHEXOL 300 MG/ML  SOLN
100.0000 mL | Freq: Once | INTRAMUSCULAR | Status: AC | PRN
  Administered 2011-10-25: 100 mL via INTRAVENOUS

## 2011-10-25 NOTE — Telephone Encounter (Signed)
Patient asking for results of CT.Please, advise

## 2011-10-25 NOTE — Telephone Encounter (Signed)
CT scan of the abdomen compared with 2000 shows no change, all normal except for chronic calcification of the both diaphragms present on the old study. Nothing to explain RLQ adb. Pain, no tumor, cyst or inflammatory process

## 2011-10-25 NOTE — Telephone Encounter (Signed)
Spoke with patient and gave her results as per Dr. Brodie. 

## 2011-11-12 ENCOUNTER — Encounter: Payer: Self-pay | Admitting: Internal Medicine

## 2011-11-12 ENCOUNTER — Ambulatory Visit (INDEPENDENT_AMBULATORY_CARE_PROVIDER_SITE_OTHER): Payer: Medicare Other | Admitting: Internal Medicine

## 2011-11-12 ENCOUNTER — Ambulatory Visit (INDEPENDENT_AMBULATORY_CARE_PROVIDER_SITE_OTHER)
Admission: RE | Admit: 2011-11-12 | Discharge: 2011-11-12 | Disposition: A | Discharge status: Home / Self Care | Payer: Medicare Other | Source: Ambulatory Visit | Attending: Internal Medicine | Admitting: Internal Medicine

## 2011-11-12 VITALS — BP 110/70 | HR 59 | Temp 97.7°F | Ht 62.0 in | Wt 120.4 lb

## 2011-11-12 DIAGNOSIS — H9190 Unspecified hearing loss, unspecified ear: Secondary | ICD-10-CM

## 2011-11-12 DIAGNOSIS — Z2911 Encounter for prophylactic immunotherapy for respiratory syncytial virus (RSV): Secondary | ICD-10-CM

## 2011-11-12 DIAGNOSIS — N63 Unspecified lump in unspecified breast: Secondary | ICD-10-CM

## 2011-11-12 DIAGNOSIS — R209 Unspecified disturbances of skin sensation: Secondary | ICD-10-CM

## 2011-11-12 DIAGNOSIS — Z23 Encounter for immunization: Secondary | ICD-10-CM

## 2011-11-12 DIAGNOSIS — R002 Palpitations: Secondary | ICD-10-CM

## 2011-11-12 DIAGNOSIS — N6320 Unspecified lump in the left breast, unspecified quadrant: Secondary | ICD-10-CM

## 2011-11-12 DIAGNOSIS — R200 Anesthesia of skin: Secondary | ICD-10-CM

## 2011-11-12 NOTE — Progress Notes (Signed)
Subjective:    Patient ID: Emily [name], female    DOB: [birth year], 71 y.o.   MRN: [id number]  HPI  complains of new lump in L breast - Onset noted 3 days ago - hard enlargement along rib below L breast fold Tender initially but pain has resolved -  No rash, redness or abnormal warmth No recent or remote injury  Also numbness and burning over chole scar -RUQ Present > 6 mo Has seen GI for same - recent CT unremarkable for any problem improved with prn Voltaren -   Also decreased hearing B - Onset > 6 mo No pain, ringing or head trauma  Also ? zostavax approp  Also review other chronic med issues  hypothyroid - reports compliance with ongoing medical treatment; changes in medication dose 10/2009 reviewed. denies adverse side effects related to current therapy. no weight or skin changes but chronic fatigue and poor sleep.   neck pain - reports hx "bad neck on xray" years ago - increasing tightness without radiating pain across back of neck L>R side - not improved with massage - discomfort most when lying down at night - no weakness, no balance problems or trouble walking - no arm numbness or grip weakenss   palpitations - take atenolol low dose for same - generally controls heart symptoms without dizziness or low BP - no racing or sustained palpitations - worse with cholcolate and caffiene use  - ?decrease dose given lack of symptoms ?  remote hx breast ca - no recurrence s/p chemo and lumpectomy in 2001 related to same   Past Medical History  Diagnosis Date  . Allergic rhinitis   . Squamous cell carcinoma     History of BC  . Heart palpitations   . Actinic keratosis   . BACK PAIN   . CARCINOMA, BREAST, ESTROGEN RECEPTOR NEGATIVE 10/12/2007    L s/p lumpectomy, chemo and XRT  . Cervicalgia   . DIVERTICULOSIS, COLON 03/02/2009  . ENDOMETRIOSIS   . HYPOTHYROIDISM   . Pleural plaque without asbestos     CT chest 10/2010: R>L lobes, upper> lower  . Cholelithiasis   .  Hypertension      Review of Systems  Constitutional: Negative for fever and unexpected weight change.  Respiratory: Negative for cough and shortness of breath.   Cardiovascular: Negative for chest pain and leg swelling.       Objective:   Physical Exam  BP 110/70  Pulse 59  Temp(Src) 97.7 F (36.5 C) (Oral)  Ht 5\' 2"  (1.575 m)  Wt 120 lb 6.4 oz (54.613 kg)  BMI 22.02 kg/m2  SpO2 96%  Wt Readings from Last 3 Encounters:  11/12/11 120 lb 6.4 oz (54.613 kg)  10/22/11 119 lb (53.978 kg)  09/17/11 118 lb 3.2 oz (53.615 kg)   Constitutional: She is well-developed and well-nourished. No distress.  HENT: TMs clear - no effusion or  Cerumen Chest wall - firm prominence  - bony feel, non tender - no fluctuance -  Cardiovascular: Normal rate, regular rhythm and normal heart sounds.  No murmur heard. No BLE edema. Pulmonary/Chest: Effort normal and breath sounds normal. No respiratory distress. She has no wheezes.  Skin: Skin is warm and dry. No rash noted. No erythema.  Psychiatric: She has a normal mood and affect. Her behavior is normal. Judgment and thought content normal.   Lab Results  Component Value Date   WBC 7.2 12/18/2009   HGB 14.2 12/18/2009   HCT 40.7 12/18/2009  PLT 184.0 12/18/2009   CHOL 197 12/18/2009   TRIG 96.0 12/18/2009   HDL 44.60 12/18/2009   LDLDIRECT 156.4 06/11/2007   ALT 17 12/18/2009   AST 19 12/18/2009   NA 142 12/18/2009   K 4.4 12/18/2009   CL 107 12/18/2009   CREATININE 0.9 10/23/2011   BUN 21 10/23/2011   CO2 30 12/18/2009   TSH 0.65 09/17/2011   Ct Abdomen Pelvis W Contrast  10/25/2011  *RADIOLOGY REPORT*  Clinical Data: Right lower quadrant abdominal pain.  History breast cancer  CT ABDOMEN AND PELVIS WITH CONTRAST  Technique:  Multidetector CT imaging of the abdomen and pelvis was performed following the standard protocol during bolus administration of intravenous contrast.  Contrast: OMNIPAQUE IOHEXOL 300 MG/ML IJ SOLN the  Comparison: The chest CT 07/01/1999   Findings: There is nodular pleural thickening over the left and right hemidiaphragm with  calcifications which are unchanged from prior.  No new parenchymal lesions are present.  No focal hepatic lesions.  Gallbladder is surgically absent. Pancreas, spleen, adrenal glands, and kidneys are normal.  The stomach, small bowel and colon are normal.  Abdominal aorta is normal caliber.  No retroperitoneal periportal lymphadenopathy.  Bladder uterus are normal.  No pelvic lymphadenopathy. Review of bone windows demonstrates no aggressive osseous lesions.  IMPRESSION:  1.  No explanation for right lower quadrant pain.  2.  Stable nodular pleural thickening over the diaphragms.  Original Report Authenticated By: Genevive Bi, M.D.      Assessment & Plan:    Left rib prominence - suspect postural irregularity rather than breast mass. Reviewed ultrasound and diagnostic management December 2012 which was benign. We'll check rib films now and referrals again for a diagnostic exam of given history of left side breast cancer -  Decreased hearing. ENT exam without wax or effusion. Refer to audiology  Right upper quadrant numbness. 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 Continue Voltaren when necessary if increased for frequent use, consider gabapentin - no change in medications recommended at this time  History palpitations -okay to decrease atenolol dose as tolerated. -

## 2011-11-12 NOTE — Patient Instructions (Signed)
It was good to see you today. Rib xray ordered today. Your results will be called to you after review (48-72hours after test completion). If any changes need to be made, you will be notified at that time. we'll make referral for a diagnostic mammogram on left side and for hearing audiology evaluation. Our office will contact you regarding appointment(s) once made. Decrease atenolol to 12-1/2 mg, half tablet daily - okay to stop after 2 weeks if no palpitations. Call if recurrent palpitation symptoms Please schedule followup in 6 months, call sooner if problems.

## 2011-11-22 ENCOUNTER — Other Ambulatory Visit: Payer: Self-pay | Admitting: Internal Medicine

## 2011-11-22 ENCOUNTER — Ambulatory Visit
Admission: RE | Admit: 2011-11-22 | Discharge: 2011-11-22 | Disposition: A | Discharge status: Home / Self Care | Payer: Medicare Other | Source: Ambulatory Visit | Attending: Internal Medicine | Admitting: Internal Medicine

## 2011-11-22 DIAGNOSIS — N6320 Unspecified lump in the left breast, unspecified quadrant: Secondary | ICD-10-CM

## 2011-12-16 ENCOUNTER — Encounter: Payer: Self-pay | Admitting: Internal Medicine

## 2012-01-01 ENCOUNTER — Telehealth: Payer: Self-pay

## 2012-01-01 DIAGNOSIS — C50919 Malignant neoplasm of unspecified site of unspecified female breast: Secondary | ICD-10-CM

## 2012-01-01 NOTE — Telephone Encounter (Signed)
All evaluation of this area since 10/2011 has been unremarkable for recurrent cancer - but i will refer to [facility] as requested

## 2012-01-01 NOTE — Telephone Encounter (Signed)
Pt called requesting a referral to Oncology Dr Rolm Baptise at [facility] cancer center for concerning area under her LT breast. Pt has hx of LT breast cancer.

## 2012-01-06 ENCOUNTER — Telehealth: Payer: Self-pay | Admitting: *Deleted

## 2012-01-06 NOTE — Telephone Encounter (Signed)
In triaging New Patient Consult for question of Metastatic Breast Cancer, no evidence has been found by referring MD that there is a recurrence, however, in speaking with the patient she states she would just feel better to make contact and be further assessed by Dr Truett Perna. Informed patient we would schedule this routine in the next couple weeks, but if anything new surfaces, she should call us back. Patient verbalized understanding. Orders for New Patient appt, last office visit was 06/2008 to be followed by Dr Cherly Hensen.

## 2012-01-07 ENCOUNTER — Telehealth: Payer: Self-pay | Admitting: Oncology

## 2012-01-07 NOTE — Telephone Encounter (Signed)
S/w the pt and she is aware of her new pt appt with dr Truett Perna

## 2012-01-08 ENCOUNTER — Telehealth: Payer: Self-pay | Admitting: Oncology

## 2012-01-08 ENCOUNTER — Other Ambulatory Visit: Payer: Self-pay | Admitting: Internal Medicine

## 2012-01-08 NOTE — Telephone Encounter (Signed)
Referred by Dr. Rene Paci Dx- Hx Breast CA

## 2012-01-08 NOTE — Telephone Encounter (Signed)
Referred by Dr. Rene Paci Dx- Hx breast Ca

## 2012-01-08 NOTE — Telephone Encounter (Signed)
pt called and r/s appt on 06/04 to 06/03

## 2012-01-20 ENCOUNTER — Encounter: Payer: Self-pay | Admitting: Oncology

## 2012-01-20 ENCOUNTER — Ambulatory Visit (HOSPITAL_BASED_OUTPATIENT_CLINIC_OR_DEPARTMENT_OTHER): Payer: Medicare Other | Admitting: Oncology

## 2012-01-20 ENCOUNTER — Ambulatory Visit: Payer: Medicare Other

## 2012-01-20 VITALS — BP 114/72 | HR 62 | Temp 98.1°F | Ht 62.0 in | Wt 120.8 lb

## 2012-01-20 DIAGNOSIS — Z853 Personal history of malignant neoplasm of breast: Secondary | ICD-10-CM

## 2012-01-20 DIAGNOSIS — Q676 Pectus excavatum: Secondary | ICD-10-CM

## 2012-01-20 DIAGNOSIS — C50919 Malignant neoplasm of unspecified site of unspecified female breast: Secondary | ICD-10-CM

## 2012-01-20 DIAGNOSIS — M4120 Other idiopathic scoliosis, site unspecified: Secondary | ICD-10-CM

## 2012-01-20 NOTE — Progress Notes (Signed)
[facility] New Patient Consult   Referring MD: Rene Paci   [name] 71 y.o.  11-15-1940    Reason for Referral: History of breast cancer with a fullness at the left chest wall     HPI: She was diagnosed with left-sided breast cancer in 2001. She was treated with a lumpectomy, axillary lymph node dissection, and adjuvant radiation. She also received adjuvant CMF chemotherapy.  [name] was last seen at the [place] [facility] in 2009. She is followed by Dr.Leschber for internal medicine care.  She reports noting an asymmetry in the shape of the chest wall for the past several months. There is a prominence of the left lower chest wall compared to the right side. She notes mild discomfort in this area at the bra line. No pain.  She was evaluated with negative left-sided rib films on 11/13/2011. A left mammogram was negative on 11/22/2011. This included an ultrasound of the area of concern which did not show an overlying mass.  She is concerned the chest asymmetry could be related to recurrent breast cancer.  Past Medical History  Diagnosis Date  . Allergic rhinitis   . Squamous cell carcinoma     History of BC  . Heart palpitations   . Actinic keratosis   . BACK PAIN   . CARCINOMA, BREAST, ESTROGEN RECEPTOR NEGATIVE  2001     L s/p lumpectomy, chemo and XRT, (T1 B, N0)   . Cervicalgia   . DIVERTICULOSIS, COLON 03/02/2009  . ENDOMETRIOSIS   . HYPOTHYROIDISM   . Pleural plaque without asbestos     CT chest 10/2010: R>L lobes, upper> lower  . Cholelithiasis   . Hypertension     Past Surgical History  Procedure Date  . Cholecystectomy 2010  . Endometrial ablation   . Breast lumpectomy 2001    left  . Tubal ligation     Family History  Problem Relation Age of Onset  . Heart disease Father   . Emphysema Father     smoked and worked with asbestos  . Colon cancer Neg Hx    no family history of cancer. One sister and one  brother  Current outpatient prescriptions:atenolol (TENORMIN) 25 MG tablet, TAKE ONE TABLET BY MOUTH ONE TIME DAILY, Disp: 90 tablet, Rfl: 1;  Calcium-Vitamin D-Vitamin K (CALCIUM + D + K) 750-500-40 MG-UNT-MCG TABS, Take 1 tablet by mouth 2 (two) times daily.  , Disp: , Rfl: ;  levothyroxine (LEVOXYL) 75 MCG tablet, Take 1 tablet (75 mcg total) by mouth daily., Disp: 90 tablet, Rfl: 1 Multiple Vitamin (MULTIVITAMIN) capsule, Take 1 capsule by mouth daily.  , Disp: , Rfl: ;  DISCONTD: atenolol (TENORMIN) 25 MG tablet, Take 25 mg by mouth daily. , Disp: 90 tablet, Rfl: 0  Allergies:  Allergies  Allergen Reactions  . Prednisone Swelling and Rash    Social History: She lives with her husband and Emily [name]. She is retired from the [employer] occupation. No history of tobacco use. She drinks alcohol occasionally.   ROS:   Positives include: Sinus drainage, intermittent palpitations, palpable fullness at the left lower anterior chest wall for the past 2 months  A complete ROS was otherwise negative.  Physical Exam:  Blood pressure 114/72, pulse 62, temperature 98.1 F (36.7 C), temperature source Oral, height 5\' 2"  (1.575 m), weight 120 lb 12.8 oz (54.795 kg).  HEENT: Oropharynx without visible mass, neck without mass Lungs: end inspiratory bronchial sounds at the upper posterior  chest bilaterally, no respiratory distress Cardiac: Regular rate and rhythm Abdomen: Nontender, no hepatosplenomegaly, no mass  Vascular: No leg or arm edema Lymph nodes: No cervical, supraclavicular, axillary, or inguinal nodes Neurologic: Alert and oriented Breast: Status post left lumpectomy. No evidence for local tumor recurrence. No mass in either breast Musculoskeletal: Pectus excavatum, prominent bony ridge at the medial low anterior chest wall bilaterally. The left lower rib cage appears to be slightly more prominent and with a superior elevation compared to the right side. No discrete mass. No  tenderness.   Radiology: As per history of present illness, I reviewed the April 2013 rib films and lumbar spine x-rays from January of 2013 with the patient   Assessment/Plan:   1. Stage I left-sided breast cancer diagnosed in 2001, treated with a lumpectomy and adjuvant chemotherapy/radiation  2. Pectus excavatum  3. Prominence of the left low anterior chest wall/ribs-likely due to benign musculoskeletal asymmetry, potentially related to the pectus excavatum and a degree of kyphoscoliosis   Disposition:   I think the chest wall findings are likely related to benign asymmetry of the ribs. I have a low clinical suspicion for recurrent breast cancer. She does not have pain or other symptoms to suggest recurrent breast cancer. She will contact us if she develops pain.  Emily [name] will continue clinical followup with Dr.Leschber. We did not schedule a followup appointment at the cancer Center. I will be glad to see her in the future as needed.  Refael Fulop 01/20/2012, 3:36 PM

## 2012-01-20 NOTE — Progress Notes (Signed)
New patient, patient has insurance patient aware of financial assistance patient at this time will not need assistance patient will contact me if needed in the future, gave patient contact information.

## 2012-01-21 ENCOUNTER — Ambulatory Visit: Payer: Medicare Other | Admitting: Oncology

## 2012-01-21 ENCOUNTER — Ambulatory Visit: Payer: Medicare Other

## 2012-02-12 ENCOUNTER — Telehealth: Payer: Self-pay | Admitting: Internal Medicine

## 2012-02-12 MED ORDER — PROMETHAZINE HCL 25 MG RE SUPP: 25.0000 mg | RECTAL | Status: DC | PRN

## 2012-02-12 NOTE — Telephone Encounter (Signed)
Notified pt with md response. [facility]... 02/12/12@2 :20pm/LMB

## 2012-02-12 NOTE — Telephone Encounter (Signed)
Can send rx for promethazine suppository 25mg  PR q4h prn #12 if pt feels able to treat at home and schedule OV for tomorrow to eval same - if pt with severe pain, or unable to keep fluids down after taking prometh, agree with UC eval - thanks

## 2012-02-12 NOTE — Telephone Encounter (Signed)
Pt called back.  She would like for the suppository RX to be called into [facility] on [address] ([phone]).  She may call back to make an appt if the diarrhea and vomiting slows down.

## 2012-02-12 NOTE — Telephone Encounter (Signed)
Caller: Emily [name]/Patient; PCP: Rene Paci; CB#: [phone];  Call regarding Vomiting/Diarrhea; Onset: 02/12/12 0400. Afebrile. Severe diarrhea with 10 stools and moderate emesis with 4 since 0400.  Last voids at 0430 & 0800. Advised to see MD immediately for unable to take or keep down RX medication per Nausea or Vomiting Guideline. No appts remain for 02/12/12;  Info sent to [facility] for call back.

## 2012-02-12 NOTE — Telephone Encounter (Signed)
Pt is stating that she is still experiencing diarrhea with mild cramping, last BM 10 minutes ago. Pt has also been trying to take some sips of water but vomits each time. Pt is concerned about using a suppository with continued diarrhea, pt request specific advisement from MD regarding UC eval.

## 2012-02-12 NOTE — Telephone Encounter (Signed)
Please advise, okay to advised UC or ED?

## 2012-03-31 ENCOUNTER — Telehealth: Payer: Self-pay | Admitting: *Deleted

## 2012-03-31 ENCOUNTER — Telehealth: Payer: Self-pay | Admitting: Internal Medicine

## 2012-03-31 NOTE — Telephone Encounter (Signed)
OV recommended to determine appropriate labs/meds

## 2012-03-31 NOTE — Telephone Encounter (Signed)
Notified pt with md response. Made appt for tomorrow @ 04/01/12@1 :27pm/LMB

## 2012-03-31 NOTE — Telephone Encounter (Signed)
A user error has taken place: Open by mistake.Marland KitchenMarland Kitchen8/13/13@1 :39pm/LMB

## 2012-04-01 ENCOUNTER — Ambulatory Visit (INDEPENDENT_AMBULATORY_CARE_PROVIDER_SITE_OTHER): Payer: Medicare Other | Admitting: Internal Medicine

## 2012-04-01 ENCOUNTER — Encounter: Payer: Self-pay | Admitting: Internal Medicine

## 2012-04-01 ENCOUNTER — Other Ambulatory Visit (INDEPENDENT_AMBULATORY_CARE_PROVIDER_SITE_OTHER): Payer: Medicare Other

## 2012-04-01 VITALS — BP 110/62 | HR 64 | Temp 97.6°F | Ht 62.0 in | Wt 121.8 lb

## 2012-04-01 DIAGNOSIS — R5383 Other fatigue: Secondary | ICD-10-CM

## 2012-04-01 DIAGNOSIS — M542 Cervicalgia: Secondary | ICD-10-CM

## 2012-04-01 DIAGNOSIS — R5381 Other malaise: Secondary | ICD-10-CM

## 2012-04-01 DIAGNOSIS — E039 Hypothyroidism, unspecified: Secondary | ICD-10-CM

## 2012-04-01 DIAGNOSIS — R002 Palpitations: Secondary | ICD-10-CM

## 2012-04-01 LAB — CBC WITH DIFFERENTIAL/PLATELET
Basophils Absolute: 0 10*3/uL (ref 0.0–0.1)
Eosinophils Absolute: 0.3 10*3/uL (ref 0.0–0.7)
Hemoglobin: 13.7 g/dL (ref 12.0–15.0)
Lymphocytes Relative: 29.2 % (ref 12.0–46.0)
Lymphs Abs: 2.3 10*3/uL (ref 0.7–4.0)
MCHC: 33.1 g/dL (ref 30.0–36.0)
MCV: 89.5 fl (ref 78.0–100.0)
Monocytes Absolute: 0.8 10*3/uL (ref 0.1–1.0)
Neutro Abs: 4.4 10*3/uL (ref 1.4–7.7)
RDW: 12.7 % (ref 11.5–14.6)

## 2012-04-01 MED ORDER — ATENOLOL 25 MG PO TABS: 12.5000 mg | ORAL_TABLET | Freq: Every day | ORAL | Status: DC

## 2012-04-01 NOTE — Assessment & Plan Note (Signed)
Lab Results  Component Value Date   TSH 0.65 09/17/2011   The current medical regimen is effective;  continue present plan and medications.

## 2012-04-01 NOTE — Progress Notes (Signed)
  Subjective:    Patient ID: Emily [name], female    DOB: 01-18-1941, 71 y.o.   MRN: [id number]  HPI  Here for sleep problems aggravated by neck pain, positional No radiation No snoring or abnormal dreams  Also review other chronic med issues  hypothyroid - reports compliance with ongoing medical treatment; changes in medication dose 10/2009 reviewed. denies adverse side effects related to current therapy. no weight or skin changes but chronic fatigue and poor sleep.   neck pain - reports hx "bad neck on xray" years ago - increasing tightness without radiating pain across back of neck L>R side - not improved with massage - discomfort most when lying down at night - no weakness, no balance problems or trouble walking - no arm numbness or grip weakenss   palpitations - take atenolol low dose for same - generally controls heart symptoms without dizziness or low BP - no racing or sustained palpitations - worse with cholcolate and caffiene use  - ?decrease dose given lack of symptoms ?  remote hx breast ca - no recurrence s/p chemo and lumpectomy in 2001 related to same   Past Medical History  Diagnosis Date  . Allergic rhinitis   . Squamous cell carcinoma     History of BC  . Heart palpitations   . Actinic keratosis   . BACK PAIN   . CARCINOMA, BREAST, ESTROGEN RECEPTOR NEGATIVE 10/12/2007    L s/p lumpectomy, chemo and XRT  . Cervicalgia   . DIVERTICULOSIS, COLON 03/02/2009  . ENDOMETRIOSIS   . HYPOTHYROIDISM   . Pleural plaque without asbestos     CT chest 10/2010: R>L lobes, upper> lower  . Cholelithiasis   . Hypertension      Review of Systems  Constitutional: Positive for fatigue. Negative for fever and unexpected weight change.  Respiratory: Negative for cough and shortness of breath.   Cardiovascular: Negative for chest pain and leg swelling.       Objective:   Physical Exam  BP 110/62  Pulse 64  Temp 97.6 F (36.4 C) (Oral)  Ht 5\' 2"  (1.575 m)  Wt 121 lb  12.8 oz (55.248 kg)  BMI 22.28 kg/m2  SpO2 98%  Wt Readings from Last 3 Encounters:  04/01/12 121 lb 12.8 oz (55.248 kg)  01/20/12 120 lb 12.8 oz (54.795 kg)  11/12/11 120 lb 6.4 oz (54.613 kg)   Constitutional: She is well-developed and well-nourished. No distress.  Cardiovascular: Normal rate, regular rhythm and normal heart sounds.  No murmur heard. No BLE edema. Pulmonary/Chest: Effort normal and breath sounds normal. No respiratory distress. She has no wheezes.  Skin: Skin is warm and dry. No rash noted. No erythema.  Psychiatric: She has a normal mood and affect. Her behavior is normal. Judgment and thought content normal.   Lab Results  Component Value Date   WBC 7.2 12/18/2009   HGB 14.2 12/18/2009   HCT 40.7 12/18/2009   PLT 184.0 12/18/2009   CHOL 197 12/18/2009   TRIG 96.0 12/18/2009   HDL 44.60 12/18/2009   LDLDIRECT 156.4 06/11/2007   ALT 17 12/18/2009   AST 19 12/18/2009   NA 142 12/18/2009   K 4.4 12/18/2009   CL 107 12/18/2009   CREATININE 0.9 10/23/2011   BUN 21 10/23/2011   CO2 30 12/18/2009   TSH 0.65 09/17/2011       Assessment & Plan:   Fatigue - chronic - no specific exam or hx - check labs

## 2012-04-01 NOTE — Assessment & Plan Note (Signed)
Chronic issues - declines muscle relaxer or NSAIDs Will refer to ortho spine/[place]

## 2012-04-01 NOTE — Patient Instructions (Signed)
It was good to see you today. Take 1/2-1 atenolol as discussed based on symptoms  Test(s) ordered today. Your results will be called to you after review (48-72hours after test completion). If any changes need to be made, you will be notified at that time. we'll make referral to spine for neck pain. Our office will contact you regarding appointment(s) once made.

## 2012-04-01 NOTE — Assessment & Plan Note (Signed)
Chronic - use atenolol to control same but then low blood pressure with fatigue and dizziness Advised 1/2-1 tab qhs prn

## 2012-04-02 ENCOUNTER — Ambulatory Visit: Payer: Medicare Other | Admitting: Internal Medicine

## 2012-04-02 LAB — BASIC METABOLIC PANEL
CO2: 30 mEq/L (ref 19–32)
Calcium: 9.8 mg/dL (ref 8.4–10.5)
Chloride: 104 mEq/L (ref 96–112)
Creatinine, Ser: 1 mg/dL (ref 0.4–1.2)
Glucose, Bld: 96 mg/dL (ref 70–99)
Sodium: 140 mEq/L (ref 135–145)

## 2012-04-02 LAB — HEPATIC FUNCTION PANEL
AST: 18 U/L (ref 0–37)
Alkaline Phosphatase: 67 U/L (ref 39–117)
Total Bilirubin: 0.3 mg/dL (ref 0.3–1.2)

## 2012-04-02 LAB — TSH: TSH: 1.57 u[IU]/mL (ref 0.35–5.50)

## 2012-04-13 ENCOUNTER — Other Ambulatory Visit: Payer: Self-pay | Admitting: *Deleted

## 2012-04-13 MED ORDER — LEVOTHYROXINE SODIUM 75 MCG PO TABS: 75.0000 ug | ORAL_TABLET | Freq: Every day | ORAL | Status: DC

## 2012-06-10 ENCOUNTER — Encounter: Payer: Self-pay | Admitting: Internal Medicine

## 2012-06-10 ENCOUNTER — Ambulatory Visit (INDEPENDENT_AMBULATORY_CARE_PROVIDER_SITE_OTHER): Payer: Medicare Other | Admitting: Internal Medicine

## 2012-06-10 ENCOUNTER — Other Ambulatory Visit (INDEPENDENT_AMBULATORY_CARE_PROVIDER_SITE_OTHER): Payer: Medicare Other

## 2012-06-10 VITALS — BP 118/76 | HR 53 | Temp 97.7°F | Ht 62.0 in | Wt 118.0 lb

## 2012-06-10 DIAGNOSIS — Z0000 Encounter for general adult medical examination without abnormal findings: Secondary | ICD-10-CM

## 2012-06-10 DIAGNOSIS — E039 Hypothyroidism, unspecified: Secondary | ICD-10-CM

## 2012-06-10 DIAGNOSIS — Z23 Encounter for immunization: Secondary | ICD-10-CM

## 2012-06-10 DIAGNOSIS — Z79899 Other long term (current) drug therapy: Secondary | ICD-10-CM

## 2012-06-10 DIAGNOSIS — D229 Melanocytic nevi, unspecified: Secondary | ICD-10-CM

## 2012-06-10 DIAGNOSIS — D239 Other benign neoplasm of skin, unspecified: Secondary | ICD-10-CM

## 2012-06-10 LAB — LIPID PANEL: HDL: 51.7 mg/dL (ref >39.00)

## 2012-06-10 LAB — LDL CHOLESTEROL, DIRECT: Direct LDL: 149.5 mg/dL

## 2012-06-10 NOTE — Patient Instructions (Signed)
It was good to see you today. Health Maintenance reviewed - flu sot given today -all other recommended immunizations and age-appropriate screenings are up-to-date. We have reviewed your prior records including labs and tests today Test(s) ordered today. Your results will be released to MyChart (or called to you) after review, usually within 72hours after test completion. If any changes need to be made, you will be notified at that same time. we'll make referral to dermatiology. Our office will contact you regarding appointment(s) once made. Please schedule followup in 6 months for thyroid check, call sooner if problems.  Health Maintenance, Females A healthy lifestyle and preventative care can promote health and wellness.  Maintain regular health, dental, and eye exams.   Eat a healthy diet. Foods like vegetables, fruits, whole grains, low-fat dairy products, and lean protein foods contain the nutrients you need without too many calories. Decrease your intake of foods high in solid fats, added sugars, and salt. Get information about a proper diet from your caregiver, if necessary.   Regular physical exercise is one of the most important things you can do for your health. Most adults should get at least 150 minutes of moderate-intensity exercise (any activity that increases your heart rate and causes you to sweat) each week. In addition, most adults need muscle-strengthening exercises on 2 or more days a week.     Maintain a healthy weight. The body mass index (BMI) is a screening tool to identify possible weight problems. It provides an estimate of body fat based on height and weight. Your caregiver can help determine your BMI, and can help you achieve or maintain a healthy weight. For adults 20 years and older:   A BMI below 18.5 is considered underweight.   A BMI of 18.5 to 24.9 is normal.   A BMI of 25 to 29.9 is considered overweight.   A BMI of 30 and above is considered obese.    Maintain normal blood lipids and cholesterol by exercising and minimizing your intake of saturated fat. Eat a balanced diet with plenty of fruits and vegetables. Blood tests for lipids and cholesterol should begin at age 32 and be repeated every 5 years. If your lipid or cholesterol levels are high, you are over 50, or you are a high risk for heart disease, you may need your cholesterol levels checked more frequently. Ongoing high lipid and cholesterol levels should be treated with medicines if diet and exercise are not effective.   If you smoke, find out from your caregiver how to quit. If you do not use tobacco, do not start.   If you are pregnant, do not drink alcohol. If you are breastfeeding, be very cautious about drinking alcohol. If you are not pregnant and choose to drink alcohol, do not exceed 1 drink per day. One drink is considered to be 12 ounces (355 mL) of beer, 5 ounces (148 mL) of wine, or 1.5 ounces (44 mL) of liquor.   Avoid use of street drugs. Do not share needles with anyone. Ask for help if you need support or instructions about stopping the use of drugs.   High blood pressure causes heart disease and increases the risk of stroke. Blood pressure should be checked at least every 1 to 2 years. Ongoing high blood pressure should be treated with medicines, if weight loss and exercise are not effective.   If you are 79 to 70 years old, ask your caregiver if you should take aspirin to prevent strokes.  Diabetes screening involves taking a blood sample to check your fasting blood sugar level. This should be done once every 3 years, after age 45, if you are within normal weight and without risk factors for diabetes. Testing should be considered at a younger age or be carried out more frequently if you are overweight and have at least 1 risk factor for diabetes.   Breast cancer screening is essential preventative care for women. You should practice "breast self-awareness." This means  understanding the normal appearance and feel of your breasts and may include breast self-examination. Any changes detected, no matter how small, should be reported to a caregiver. Women in their 43s and 30s should have a clinical breast exam (CBE) by a caregiver as part of a regular health exam every 1 to 3 years. After age 66, women should have a CBE every year. Starting at age 34, women should consider having a mammogram (breast X-ray) every year. Women who have a family history of breast cancer should talk to their caregiver about genetic screening. Women at a high risk of breast cancer should talk to their caregiver about having an MRI and a mammogram every year.   The Pap test is a screening test for cervical cancer. Women should have a Pap test starting at age 8. Between ages 59 and 52, Pap tests should be repeated every 2 years. Beginning at age 24, you should have a Pap test every 3 years as long as the past 3 Pap tests have been normal. If you had a hysterectomy for a problem that was not cancer or a condition that could lead to cancer, then you no longer need Pap tests. If you are between ages 75 and 24, and you have had normal Pap tests going back 10 years, you no longer need Pap tests. If you have had past treatment for cervical cancer or a condition that could lead to cancer, you need Pap tests and screening for cancer for at least 20 years after your treatment. If Pap tests have been discontinued, risk factors (such as a new sexual partner) need to be reassessed to determine if screening should be resumed. Some women have medical problems that increase the chance of getting cervical cancer. In these cases, your caregiver may recommend more frequent screening and Pap tests.   The human papillomavirus (HPV) test is an additional test that may be used for cervical cancer screening. The HPV test looks for the virus that can cause the cell changes on the cervix. The cells collected during the Pap test  can be tested for HPV. The HPV test could be used to screen women aged 72 years and older, and should be used in women of any age who have unclear Pap test results. After the age of 52, women should have HPV testing at the same frequency as a Pap test.   Colorectal cancer can be detected and often prevented. Most routine colorectal cancer screening begins at the age of 7 and continues through age 9. However, your caregiver may recommend screening at an earlier age if you have risk factors for colon cancer. On a yearly basis, your caregiver may provide home test kits to check for hidden blood in the stool. Use of a small camera at the end of a tube, to directly examine the colon (sigmoidoscopy or colonoscopy), can detect the earliest forms of colorectal cancer. Talk to your caregiver about this at age 67, when routine screening begins. Direct examination of the colon should  be repeated every 5 to 10 years through age 38, unless early forms of pre-cancerous polyps or small growths are found.   Hepatitis C blood testing is recommended for all people born from [birth year] through 1964 and any individual with known risks for hepatitis C.   Practice safe sex. Use condoms and avoid high-risk sexual practices to reduce the spread of sexually transmitted infections (STIs). Sexually active women aged 54 and younger should be checked for Chlamydia, which is a common sexually transmitted infection. Older women with new or multiple partners should also be tested for Chlamydia. Testing for other STIs is recommended if you are sexually active and at increased risk.   Osteoporosis is a disease in which the bones lose minerals and strength with aging. This can result in serious bone fractures. The risk of osteoporosis can be identified using a bone density scan. Women ages 4 and over and women at risk for fractures or osteoporosis should discuss screening with their caregivers. Ask your caregiver whether you should be taking a  calcium supplement or vitamin D to reduce the rate of osteoporosis.   Menopause can be associated with physical symptoms and risks. Hormone replacement therapy is available to decrease symptoms and risks. You should talk to your caregiver about whether hormone replacement therapy is right for you.   Use sunscreen with a sun protection factor (SPF) of 30 or greater. Apply sunscreen liberally and repeatedly throughout the day. You should seek shade when your shadow is shorter than you. Protect yourself by wearing long sleeves, pants, a wide-brimmed hat, and sunglasses year round, whenever you are outdoors.   Notify your caregiver of new moles or changes in moles, especially if there is a change in shape or color. Also notify your caregiver if a mole is larger than the size of a pencil eraser.   Stay current with your immunizations.  Document Released: 02/18/2011 Document Revised: 10/28/2011 Document Reviewed: 02/18/2011 [facility] Patient Information 2013 [place], [place].

## 2012-06-10 NOTE — Assessment & Plan Note (Signed)
Lab Results  Component Value Date   TSH 1.57 04/01/2012   The current medical regimen is effective;  continue present plan and medications.  

## 2012-06-10 NOTE — Progress Notes (Signed)
Subjective:    Patient ID: Emily [name], female    DOB: 1941-04-08, 71 y.o.   MRN: [id number]  HPI  patient is here today for annual physical. Patient feels well overall.  Also reviewed other chronic med issues  hypothyroid - reports compliance with ongoing medical treatment; changes in medication dose 10/2009 reviewed. denies adverse side effects related to current therapy. no weight or skin changes but chronic fatigue and poor sleep.   neck pain, chronic - working with ortho spine on same - associated with tightness but without radiating pain, located across back of neck L>R side - not improved with massage - discomfort most when lying down at night - no weakness, no balance problems or trouble walking - no arm numbness or grip weakenss   palpitations - take atenolol low dose for same - generally controls heart symptoms without dizziness or low BP - no racing or sustained palpitations - worse with cholcolate and caffiene use  -   remote hx breast ca - no recurrence s/p chemo and lumpectomy in 2001 related to same   Past Medical History  Diagnosis Date  . Allergic rhinitis   . Squamous cell carcinoma     History of BC  . Heart palpitations   . Actinic keratosis   . BACK PAIN   . 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 Cervicalgia   . DIVERTICULOSIS, COLON   . ENDOMETRIOSIS   . HYPOTHYROIDISM   . Pleural plaque without asbestos     CT chest 10/2010: R>L lobes, upper> lower  . Cholelithiasis   . Hypertension    Family History  Problem Relation Age of Onset  . Heart disease Father   . Emphysema Father     smoked and worked with asbestos  . Colon cancer Neg Hx    History  Substance Use Topics  . Smoking status: Never Smoker   . Smokeless tobacco: Never Used  . Alcohol Use: No    Review of Systems  Constitutional: Positive for fatigue. Negative for fever and unexpected weight change.  Respiratory: Negative for cough  and shortness of breath.   Cardiovascular: Negative for chest pain and leg swelling.  Gastrointestinal: Negative for abdominal pain, no bowel changes.  Musculoskeletal: Negative for gait problem or joint swelling.  Skin: Negative for rash.  Neurological: Negative for dizziness or headache.  No other specific complaints in a complete review of systems (except as listed in HPI above).      Objective:   Physical Exam  BP 118/76  Pulse 53  Temp 97.7 F (36.5 C) (Oral)  Ht 5\' 2"  (1.575 m)  Wt 118 lb (53.524 kg)  BMI 21.58 kg/m2  SpO2 93%  Wt Readings from Last 3 Encounters:  06/10/12 118 lb (53.524 kg)  04/01/12 121 lb 12.8 oz (55.248 kg)  01/20/12 120 lb 12.8 oz (54.795 kg)   Constitutional: She appears well-developed and well-nourished. No distress.  HENT: Head: Normocephalic and atraumatic. Ears: B TMs ok, no erythema or effusion; Nose: Nose normal. Mouth/Throat: Oropharynx is clear and moist. No oropharyngeal exudate.  Eyes: Conjunctivae and EOM are normal. Pupils are equal, round, and reactive to light. No scleral icterus.  Neck: Normal range of motion. Neck supple. No JVD present. No thyromegaly present.  Cardiovascular: Normal rate, regular rhythm and normal heart sounds.  No murmur heard. No BLE edema. Pulmonary/Chest: Effort normal and breath sounds normal. No respiratory distress. She has no wheezes.  Abdominal: Soft. Bowel sounds are normal. She exhibits no distension. There is no tenderness. no masses Musculoskeletal: Normal range of motion, no joint effusions. No gross deformities Neurological: She is alert and oriented to person, place, and time. No cranial nerve deficit. Coordination normal.  Skin: atypical appearing nevi R temple at hairline - several benign nevi and skin tags - Skin is warm and dry. No rash noted. No erythema.  Psychiatric: She has a normal mood and affect. Her behavior is normal. Judgment and thought content normal.   Lab Results  Component Value  Date   WBC 7.8 04/01/2012   HGB 13.7 04/01/2012   HCT 41.2 04/01/2012   PLT 171.0 04/01/2012   CHOL 197 12/18/2009   TRIG 96.0 12/18/2009   HDL 44.60 12/18/2009   LDLDIRECT 156.4 06/11/2007   ALT 16 04/01/2012   AST 18 04/01/2012   NA 140 04/01/2012   K 4.0 04/01/2012   CL 104 04/01/2012   CREATININE 1.0 04/01/2012   BUN 24* 04/01/2012   CO2 30 04/01/2012   TSH 1.57 04/01/2012   ECG: sinus brady @ 51 bpm, no ischemic changes    Assessment & Plan:   CPX/v70.0 - Today patient counseled on age appropriate routine health concerns for screening and prevention, each reviewed and up to date or declined. Immunizations reviewed and up to date or declined. Labs/ECG reviewed. Risk factors for depression reviewed and negative. Hearing function and visual acuity are intact. ADLs screened and addressed as needed. Functional ability and level of safety reviewed and appropriate. Education, counseling and referrals performed based on assessed risks today. Patient provided with a copy of personalized plan for preventive services.  Atypical nevi - refer to derm for eval and tx

## 2012-07-13 ENCOUNTER — Other Ambulatory Visit: Payer: Self-pay | Admitting: Obstetrics and Gynecology

## 2012-07-13 DIAGNOSIS — M8580 Other specified disorders of bone density and structure, unspecified site: Secondary | ICD-10-CM

## 2012-07-13 DIAGNOSIS — Z1231 Encounter for screening mammogram for malignant neoplasm of breast: Secondary | ICD-10-CM

## 2012-07-29 ENCOUNTER — Other Ambulatory Visit: Payer: Self-pay | Admitting: Internal Medicine

## 2012-08-24 ENCOUNTER — Ambulatory Visit
Admission: RE | Admit: 2012-08-24 | Discharge: 2012-08-24 | Disposition: A | Discharge status: Home / Self Care | Payer: Medicare Other | Source: Ambulatory Visit | Attending: Obstetrics and Gynecology | Admitting: Obstetrics and Gynecology

## 2012-08-24 DIAGNOSIS — Z1231 Encounter for screening mammogram for malignant neoplasm of breast: Secondary | ICD-10-CM

## 2012-08-24 DIAGNOSIS — M8580 Other specified disorders of bone density and structure, unspecified site: Secondary | ICD-10-CM

## 2012-10-21 ENCOUNTER — Other Ambulatory Visit: Payer: Self-pay | Admitting: Internal Medicine

## 2012-11-06 ENCOUNTER — Encounter: Payer: Self-pay | Admitting: Internal Medicine

## 2012-11-06 ENCOUNTER — Ambulatory Visit (INDEPENDENT_AMBULATORY_CARE_PROVIDER_SITE_OTHER): Payer: Medicare Other | Admitting: Internal Medicine

## 2012-11-06 VITALS — BP 102/72 | HR 80 | Temp 97.4°F | Wt 117.8 lb

## 2012-11-06 DIAGNOSIS — R197 Diarrhea, unspecified: Secondary | ICD-10-CM

## 2012-11-06 DIAGNOSIS — A088 Other specified intestinal infections: Secondary | ICD-10-CM

## 2012-11-06 DIAGNOSIS — E039 Hypothyroidism, unspecified: Secondary | ICD-10-CM

## 2012-11-06 DIAGNOSIS — R110 Nausea: Secondary | ICD-10-CM

## 2012-11-06 DIAGNOSIS — A084 Viral intestinal infection, unspecified: Secondary | ICD-10-CM

## 2012-11-06 MED ORDER — ONDANSETRON HCL 4 MG PO TABS: 4.0000 mg | ORAL_TABLET | Freq: Three times a day (TID) | ORAL | Status: DC | PRN

## 2012-11-06 NOTE — Patient Instructions (Signed)
It was good to see you today. Zofran as needed for nausea symptoms -Your prescription(s) have been submitted to your pharmacy. Please take as directed and contact our office if you believe you are having problem(s) with the medication(s). Continue Immodium as needed for diarrhea - max 8tab/24h -  B.R.A.T. Diet Your doctor has recommended the B.R.A.T. diet for you or your child until the condition improves. This is often used to help control diarrhea and vomiting symptoms. If you or your child can tolerate clear liquids, you may have:  Bananas.   Rice.   Applesauce.   Toast (and other simple starches such as crackers, potatoes, noodles).  Be sure to avoid dairy products, meats, and fatty foods until symptoms are better. Fruit juices such as apple, grape, and prune juice can make diarrhea worse. Avoid these. Continue this diet for 2 days or as instructed by your caregiver. Document Released: 08/05/2005 Document Revised: 07/25/2011 Document Reviewed: 01/22/2007 [facility] Patient Information 2012 [place], [place].Viral Gastroenteritis Viral gastroenteritis is also known as stomach flu. This condition affects the stomach and intestinal tract. It can cause sudden diarrhea and vomiting. The illness typically lasts 3 to 8 days. Most people develop an immune response that eventually gets rid of the virus. While this natural response develops, the virus can make you quite ill. CAUSES  Many different viruses can cause gastroenteritis, such as rotavirus or noroviruses. You can catch one of these viruses by consuming contaminated food or water. You may also catch a virus by sharing utensils or other personal items with an infected person or by touching a contaminated surface. SYMPTOMS  The most common symptoms are diarrhea and vomiting. These problems can cause a severe loss of body fluids (dehydration) and a body salt (electrolyte) imbalance. Other symptoms may  include:  Fever.  Headache.  Fatigue.  Abdominal pain. DIAGNOSIS  Your caregiver can usually diagnose viral gastroenteritis based on your symptoms and a physical exam. A stool sample may also be taken to test for the presence of viruses or other infections. TREATMENT  This illness typically goes away on its own. Treatments are aimed at rehydration. The most serious cases of viral gastroenteritis involve vomiting so severely that you are not able to keep fluids down. In these cases, fluids must be given through an intravenous line (IV). HOME CARE INSTRUCTIONS   Drink enough fluids to keep your urine clear or pale yellow. Drink small amounts of fluids frequently and increase the amounts as tolerated.  Ask your caregiver for specific rehydration instructions.  Avoid:  Foods high in sugar.  Alcohol.  Carbonated drinks.  Tobacco.  Juice.  Caffeine drinks.  Extremely hot or cold fluids.  Fatty, greasy foods.  Too much intake of anything at one time.  Dairy products until 24 to 48 hours after diarrhea stops.  You may consume probiotics. Probiotics are active cultures of beneficial bacteria. They may lessen the amount and number of diarrheal stools in adults. Probiotics can be found in yogurt with active cultures and in supplements.  Wash your hands well to avoid spreading the virus.  Only take over-the-counter or prescription medicines for pain, discomfort, or fever as directed by your caregiver. Do not give aspirin to children. Antidiarrheal medicines are not recommended.  Ask your caregiver if you should continue to take your regular prescribed and over-the-counter medicines.  Keep all follow-up appointments as directed by your caregiver. SEEK IMMEDIATE MEDICAL CARE IF:   You are unable to keep fluids down.  You do not urinate  at least once every 6 to 8 hours.  You develop shortness of breath.  You notice blood in your stool or vomit. This may look like coffee  grounds.  You have abdominal pain that increases or is concentrated in one small area (localized).  You have persistent vomiting or diarrhea.  You have a fever.  The patient is a child younger than 3 months, and he or she has a fever.  The patient is a child older than 3 months, and he or she has a fever and persistent symptoms.  The patient is a child older than 3 months, and he or she has a fever and symptoms suddenly get worse.  The patient is a baby, and he or she has no tears when crying. MAKE SURE YOU:   Understand these instructions.  Will watch your condition.  Will get help right away if you are not doing well or get worse. Document Released: 08/05/2005 Document Revised: 10/28/2011 Document Reviewed: 05/22/2011 [facility] Patient Information 2013 [place], [place].

## 2012-11-06 NOTE — Assessment & Plan Note (Signed)
Lab Results  Component Value Date   TSH 1.57 04/01/2012   The current medical regimen is effective;  continue present plan and medications.

## 2012-11-06 NOTE — Progress Notes (Signed)
Subjective:    Patient ID: Emily [name], female    DOB: [birth year], 72 y.o.   MRN: [id number]  Diarrhea  Pertinent negatives include no coughing or fever.   Also reviewed other chronic med issues  hypothyroid - reports compliance with ongoing medical treatment; changes in medication dose 10/2009 reviewed. denies adverse side effects related to current therapy. no weight or skin changes but chronic fatigue and poor sleep.   neck pain, chronic - working with ortho spine on same - associated with tightness but without radiating pain, located across back of neck L>R side - not improved with massage - discomfort most when lying down at night - no weakness, no balance problems or trouble walking - no arm numbness or grip weakenss   palpitations - take atenolol low dose for same - generally controls heart symptoms without dizziness or low BP - no racing or sustained palpitations - worse with cholcolate and caffiene use  -   remote hx breast ca - no recurrence s/p chemo and lumpectomy in 2001 related to same   Past Medical History  Diagnosis Date  . Allergic rhinitis   . Squamous cell carcinoma     History of BC  . Heart palpitations   . Actinic keratosis   . BACK PAIN   . 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 Cervicalgia   . DIVERTICULOSIS, COLON   . ENDOMETRIOSIS   . HYPOTHYROIDISM   . Pleural plaque without asbestos     CT chest 10/2010: R>L lobes, upper> lower  . Cholelithiasis   . Hypertension     Review of Systems  Constitutional: Positive for fatigue. Negative for fever and unexpected weight change.  Respiratory: Negative for cough and shortness of breath.   Cardiovascular: Negative for chest pain and leg swelling.  Gastrointestinal: Positive for diarrhea.       Objective:   Physical Exam  BP 102/72  Pulse 80  Temp(Src) 97.4 F (36.3 C) (Oral)  Wt 117 lb 12.8 oz (53.434 kg)  BMI 21.54 kg/m2  SpO2 92%  Wt  Readings from Last 3 Encounters:  11/06/12 117 lb 12.8 oz (53.434 kg)  06/10/12 118 lb (53.524 kg)  04/01/12 121 lb 12.8 oz (55.248 kg)   Constitutional: She appears well-developed and well-nourished. No distress.  Eyes: Conjunctivae and EOM are normal. Pupils are equal, round, and reactive to light. No scleral icterus.  Neck: Normal range of motion. Neck supple. No JVD present. No thyromegaly present.  Cardiovascular: Normal rate, regular rhythm and normal heart sounds.  No murmur heard. No BLE edema. Pulmonary/Chest: Effort normal and breath sounds normal. No respiratory distress. She has no wheezes.  Abdominal: Soft. Bowel sounds are normal. She exhibits mild distension. There is no tenderness. no masses Rectal: scant thin brown sttol with black flecks in stool - heme negative guaiac (PA student, supervised by me) Skin: Skin is warm and dry. No rash noted. No erythema.  Psychiatric: She has a normal mood and affect. Her behavior is normal. Judgment and thought content normal.   Lab Results  Component Value Date   WBC 7.8 04/01/2012   HGB 13.7 04/01/2012   HCT 41.2 04/01/2012   PLT 171.0 04/01/2012   CHOL 210* 06/10/2012   TRIG 77.0 06/10/2012   HDL 51.70 06/10/2012   LDLDIRECT 149.5 06/10/2012   ALT 16 04/01/2012   AST 18 04/01/2012   NA 140 04/01/2012   K 4.0 04/01/2012  CL 104 04/01/2012   CREATININE 1.0 04/01/2012   BUN 24* 04/01/2012   CO2 30 04/01/2012   TSH 1.57 04/01/2012       Assessment & Plan:   vGE with diarrhea - hx/exam consistent with same -  "black stool" due to Pepto Bismol - heme neg today, reassurance and education provided continue OTC imodium prn and new rx Zofran prn - BRAT diet, push hydration

## 2012-11-06 NOTE — Progress Notes (Signed)
Subjective:    Patient ID: Emily [name], female    DOB: [birth year], 72 y.o.   MRN: [id number]  Diarrhea  This is a new problem. Episode onset: began 3 days ago. The problem occurs 2 to 4 times per day. The problem has been waxing and waning. The patient states that diarrhea does not awaken her from sleep. Associated symptoms include abdominal pain, bloating, increased flatus and weight loss (down 3 lbs since diarrhea began). Pertinent negatives include no fever or vomiting. She has tried bismuth subsalicylate (which made her stool black this morning) for the symptoms. The treatment provided moderate relief.   Past Medical History  Diagnosis Date  . Allergic rhinitis   . Squamous cell carcinoma     History of BC  . Heart palpitations   . Actinic keratosis   . BACK PAIN   . 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 Cervicalgia   . DIVERTICULOSIS, COLON   . ENDOMETRIOSIS   . HYPOTHYROIDISM   . Pleural plaque without asbestos     CT chest 10/2010: R>L lobes, upper> lower  . Cholelithiasis   . Hypertension     Review of Systems  Constitutional: Positive for weight loss (down 3 lbs since diarrhea began). Negative for fever.  Respiratory: Negative for shortness of breath.   Cardiovascular: Negative for chest pain and palpitations.  Gastrointestinal: Positive for nausea, abdominal pain, diarrhea, abdominal distention, bloating and flatus. Negative for vomiting.       Objective:   Physical Exam  Constitutional: She appears well-developed and well-nourished. No distress.  HENT:  Head: Normocephalic and atraumatic.  Cardiovascular: Normal rate and regular rhythm.  Exam reveals no gallop and no friction rub.   No murmur heard. Pulmonary/Chest: Effort normal and breath sounds normal. No respiratory distress. She has no wheezes.  Abdominal: Normal appearance. She exhibits distension. Bowel sounds are increased. There is generalized  tenderness. There is no rigidity and no guarding.  Genitourinary: Rectum normal. Rectal exam shows no mass and no tenderness. Guaiac negative stool.  Skin: Skin is warm and dry.  Psychiatric: She has a normal mood and affect. Her behavior is normal. Judgment and thought content normal.   Filed Vitals:   11/06/12 1352  BP: 102/72  Pulse: 80  Temp: 97.4 F (36.3 C)   Lab Results  Component Value Date   WBC 7.8 04/01/2012   HGB 13.7 04/01/2012   HCT 41.2 04/01/2012   PLT 171.0 04/01/2012   GLUCOSE 96 04/01/2012   CHOL 210* 06/10/2012   TRIG 77.0 06/10/2012   HDL 51.70 06/10/2012   LDLDIRECT 149.5 06/10/2012   LDLCALC 133* 12/18/2009   ALT 16 04/01/2012   AST 18 04/01/2012   NA 140 04/01/2012   K 4.0 04/01/2012   CL 104 04/01/2012   CREATININE 1.0 04/01/2012   BUN 24* 04/01/2012   CO2 30 04/01/2012   TSH 1.57 04/01/2012         Assessment & Plan:  Diarrhea- Suspect viral gastroenteritis with discoloration related to Pepto bismol as stool card was negative for heme.  Provided information regarding the BRAT diet and rx for Zofran for nausea. Pt instructed to take imodium as needed after bowel movements and told not to exceed 8 per day. Patient to return the office if symptoms worsen or do not abate by Monday.   Mackinze Criado PA-S    I have personally reviewed this case with PA student. I also personally  examined this patient. I agree with history and findings as documented above. I reviewed, discussed and approve of the assessment and plan as listed above. Rene Paci, MD

## 2012-11-16 ENCOUNTER — Other Ambulatory Visit (INDEPENDENT_AMBULATORY_CARE_PROVIDER_SITE_OTHER): Payer: Medicare Other

## 2012-11-16 DIAGNOSIS — E039 Hypothyroidism, unspecified: Secondary | ICD-10-CM

## 2012-11-16 LAB — TSH: TSH: 2.07 u[IU]/mL (ref 0.35–5.50)

## 2013-02-18 ENCOUNTER — Other Ambulatory Visit: Payer: Self-pay | Admitting: Dermatology

## 2013-03-12 ENCOUNTER — Other Ambulatory Visit: Payer: Self-pay | Admitting: Internal Medicine

## 2013-05-24 ENCOUNTER — Other Ambulatory Visit: Payer: Self-pay | Admitting: Internal Medicine

## 2013-06-07 ENCOUNTER — Ambulatory Visit (INDEPENDENT_AMBULATORY_CARE_PROVIDER_SITE_OTHER): Payer: Medicare Other | Admitting: Internal Medicine

## 2013-06-07 ENCOUNTER — Encounter: Payer: Self-pay | Admitting: Internal Medicine

## 2013-06-07 VITALS — BP 110/72 | HR 67 | Temp 97.0°F

## 2013-06-07 DIAGNOSIS — M25519 Pain in unspecified shoulder: Secondary | ICD-10-CM

## 2013-06-07 DIAGNOSIS — G8929 Other chronic pain: Secondary | ICD-10-CM

## 2013-06-07 DIAGNOSIS — M5416 Radiculopathy, lumbar region: Secondary | ICD-10-CM

## 2013-06-07 DIAGNOSIS — M76899 Other specified enthesopathies of unspecified lower limb, excluding foot: Secondary | ICD-10-CM

## 2013-06-07 DIAGNOSIS — M7062 Trochanteric bursitis, left hip: Secondary | ICD-10-CM

## 2013-06-07 DIAGNOSIS — IMO0002 Reserved for concepts with insufficient information to code with codable children: Secondary | ICD-10-CM

## 2013-06-07 DIAGNOSIS — Z23 Encounter for immunization: Secondary | ICD-10-CM

## 2013-06-07 MED ORDER — DICLOFENAC SODIUM 75 MG PO TBEC: 75.0000 mg | DELAYED_RELEASE_TABLET | Freq: Two times a day (BID) | ORAL | Status: DC

## 2013-06-07 MED ORDER — CYCLOBENZAPRINE HCL 5 MG PO TABS: 5.0000 mg | ORAL_TABLET | Freq: Three times a day (TID) | ORAL | Status: DC | PRN

## 2013-06-07 NOTE — Progress Notes (Signed)
Subjective:    Patient ID: Emily [name], female    DOB: 02-09-1941, 72 y.o.   MRN: [id number]  Leg Pain  Incident onset: began 6 months ago. There was no injury mechanism. The pain is present in the left leg. The quality of the pain is described as aching. The pain is moderate. The pain has been constant since onset. Pertinent negatives include no muscle weakness, numbness or tingling. The symptoms are aggravated by movement. Treatments tried: analgensic creams. The treatment provided no relief.  Her symptoms are worsened by activity and are interfering with sleep, especially lying on left side.  Also continued pain and problems with right shoulder -unimproved following quick said injection and physical therapy under care of orthospine during evaluation for right-sided neck pain  Past Medical History  Diagnosis Date  . Allergic rhinitis   . Squamous cell carcinoma     History of BC  . Heart palpitations   . Actinic keratosis   . BACK PAIN   . 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 Cervicalgia   . DIVERTICULOSIS, COLON   . ENDOMETRIOSIS   . HYPOTHYROIDISM   . Pleural plaque without asbestos     CT chest 10/2010: R>L lobes, upper> lower  . Cholelithiasis   . Hypertension      Review of Systems  Constitutional: Negative for fever, activity change, fatigue and unexpected weight change.  Musculoskeletal: Positive for back pain and gait problem (gait instability related to leg pain). Negative for joint swelling.  Neurological: Negative for tingling and numbness.       Objective:   Physical Exam  Constitutional: She is oriented to person, place, and time. She appears well-developed and well-nourished. No distress.  Musculoskeletal: Normal range of motion. She exhibits tenderness (greater trochanter tenderness). She exhibits no edema.       Right shoulder: She exhibits tenderness, pain and decreased strength. She exhibits normal  range of motion, no swelling, no effusion, no crepitus, no deformity, no laceration and normal pulse.  Back: full range of motion of thoracic and lumbar spine. Non tender to palpation. Negative straight leg raise. DTR's are symmetrically intact. Sensation intact in all dermatomes of the lower extremities. Full strength to manual muscle testing. patient is able to heel toe walk without difficulty and ambulates with antalgic gait.  Neurological: She is alert and oriented to person, place, and time.  Skin: She is not diaphoretic.  Psychiatric: She has a normal mood and affect. Her behavior is normal. Judgment and thought content normal.    Wt Readings from Last 3 Encounters:  11/06/12 117 lb 12.8 oz (53.434 kg)  06/10/12 118 lb (53.524 kg)  04/01/12 121 lb 12.8 oz (55.248 kg)   BP Readings from Last 3 Encounters:  06/07/13 110/72  11/06/12 102/72  06/10/12 118/76   Lab Results  Component Value Date   WBC 7.8 04/01/2012   HGB 13.7 04/01/2012   HCT 41.2 04/01/2012   PLT 171.0 04/01/2012   GLUCOSE 96 04/01/2012   CHOL 210* 06/10/2012   TRIG 77.0 06/10/2012   HDL 51.70 06/10/2012   LDLDIRECT 149.5 06/10/2012   LDLCALC 133* 12/18/2009   ALT 16 04/01/2012   AST 18 04/01/2012   NA 140 04/01/2012   K 4.0 04/01/2012   CL 104 04/01/2012   CREATININE 1.0 04/01/2012   BUN 24* 04/01/2012   CO2 30 04/01/2012   TSH 2.07 11/16/2012    DG lumbar spine January  2013: IMPRESSION: Moderate degenerative disc disease and spondylosis at L1-2. Similar mild changes at L5-S1.  Facet degenerative changes at L4-5 and L5-S1.  No acute or subacute osseous abnormality.   Procedure Note:  Greater trochanteric bursa injection, left the patient elects to proceed after verbal consent is obtained. the patient informed of possible risks and complications prior to procedure. Using sterile technique throughout, patient is injected with 1:3 DepoMedrol (40 mg): 1% lidocaine into trochanteric bursa at site of maximal tenderness.  the patient tolerated the procedure well. Ice 24-48h, heat thereafter as needed instructions aftercare provided.      Assessment & Plan:   Left leg pain -ongoing. 6 months  Suspect multifactorial: lumbar radiculopathy with overlapping greater trochanteric bursitis  Injection of left trochanteric bursa as noted above today -tolerated well  Will treat left lumbar radiculopathy symptoms with Diclofenac x 2 weeks and muscle relaxant at bedtime for symptom relief.   Pt with known previous DDD as per lumbar spine plain film January 2013 is reviewed today.    home back exercises provided  Pt to call back if symptoms not improved/worsened in next 2-4 weeks To consider further imaging as needed for particular radiculopathy etiology.    Right shoulder pain, chronic. Suspect degenerative rotator cuff tendinitis. Reports prior evaluation including cortisone injection at orthospine [facility]) and physical therapy, unimproved in past 6-12 months. We'll refer now to orthopedic specialist to reevaluate and consider imaging as needed

## 2013-06-07 NOTE — Progress Notes (Signed)
Pre-visit discussion using our clinic review tool. No additional management support is needed unless otherwise documented below in the visit note.  

## 2013-06-07 NOTE — Patient Instructions (Addendum)
It was good to see you today.  We have injected your left-sided trochanteric bursa to help with leg pain today - place ice to this area for 5-10 minutes 3 times daily for the next 48-72 hours, then as needed  Begin diclofenac twice daily with food to help back pain x 14days Also muscle relaxant Flexeril at bedtime for the next 7 days, then as needed  Your prescription(s) have been submitted to your pharmacy. Please take as directed and contact our office if you believe you are having problem(s) with the medication(s).  See below for back exercises and stretches, beginning next 48-72 hours once symptoms improve  We'll make referral to orthopedic shoulder specialist for evaluation of her right shoulder problems. My office will call regarding this appointment once made   Back Exercises Back exercises help treat and prevent back injuries. The goal is to increase your strength in your belly (abdominal) and back muscles. These exercises can also help with flexibility. Start these exercises when told by your doctor. HOME CARE Back exercises include: Pelvic Tilt.  Lie on your back with your knees bent. Tilt your pelvis until the lower part of your back is against the floor. Hold this position 5 to 10 sec. Repeat this exercise 5 to 10 times. Knee to Chest.  Pull 1 knee up against your chest and hold for 20 to 30 seconds. Repeat this with the other knee. This may be done with the other leg straight or bent, whichever feels better. Then, pull both knees up against your chest. Sit-Ups or Curl-Ups.  Bend your knees 90 degrees. Start with tilting your pelvis, and do a partial, slow sit-up. Only lift your upper half 30 to 45 degrees off the floor. Take at least 2 to 3 seonds for each sit-up. Do not do sit-ups with your knees out straight. If partial sit-ups are difficult, simply do the above but with only tightening your belly (abdominal) muscles and holding it as told. Hip-Lift.  Lie on your back with  your knees flexed 90 degrees. Push down with your feet and shoulders as you raise your hips 2 inches off the floor. Hold for 10 seconds, repeat 5 to 10 times. Back Arches.  Lie on your stomach. Prop yourself up on bent elbows. Slowly press on your hands, causing an arch in your low back. Repeat 3 to 5 times. Shoulder-Lifts.  Lie face down with arms beside your body. Keep hips and belly pressed to floor as you slowly lift your head and shoulders off the floor. Do not overdo your exercises. Be careful in the beginning. Exercises may cause you some mild back discomfort. If the pain lasts for more than 15 minutes, stop the exercises until you see your doctor. Improvement with exercise for back problems is slow.  Document Released: 09/07/2010 Document Revised: 10/28/2011 Document Reviewed: 06/06/2011 [facility] Patient Information 2014 Emily [name], [place]. Trochanteric Bursitis You have hip pain due to trochanteric bursitis. Bursitis means that the sack near the outside of the hip is filled with fluid and inflamed. This sack is made up of protective soft tissue. The pain from trochanteric bursitis can be severe and keep you from sleep. It can radiate to the buttocks or down the outside of the thigh to the knee. The pain is almost always worse when rising from the seated or lying position and with walking. Pain can improve after you take a few steps. It happens more often in people with hip joint and lumbar spine problems, such as  arthritis or previous surgery. Very rarely the trochanteric bursa can become infected, and antibiotics and/or surgery may be needed. Treatment often includes an injection of local anesthetic mixed with cortisone medicine. This medicine is injected into the area where it is most tender over the hip. Repeat injections may be necessary if the response to treatment is slow. You can apply ice packs over the tender area for 30 minutes every 2 hours for the next few days. Anti-inflammatory and/or  narcotic pain medicine may also be helpful. Limit your activity for the next few days if the pain continues. See your caregiver in 5-10 days if you are not greatly improved.  SEEK IMMEDIATE MEDICAL CARE IF:  You develop severe pain, fever, or increased redness.  You have pain that radiates below the knee. EXERCISES STRETCHING EXERCISES - Trochantic Bursitis  These exercises may help you when beginning to rehabilitate your injury. Your symptoms may resolve with or without further involvement from your physician, physical therapist or athletic trainer. While completing these exercises, remember:   Restoring tissue flexibility helps normal motion to return to the joints. This allows healthier, less painful movement and activity.  An effective stretch should be held for at least 30 seconds.  A stretch should never be painful. You should only feel a gentle lengthening or release in the stretched tissue. STRETCH  Iliotibial Band  On the floor or bed, lie on your side so your injured leg is on top. Bend your knee and grab your ankle.  Slowly bring your knee back so that your thigh is in line with your trunk. Keep your heel at your buttocks and gently arch your back so your head, shoulders and hips line up.  Slowly lower your leg so that your knee approaches the floor/bed until you feel a gentle stretch on the outside of your thigh. If you do not feel a stretch and your knee will not fall farther, place the heel of your opposite foot on top of your knee and pull your thigh down farther.  Hold this stretch for __________ seconds.  Repeat __________ times. Complete this exercise __________ times per day. STRETCH Hamstrings, Supine   Lie on your back. Loop a belt or towel over the ball of your foot as shown.  Straighten your knee and slowly pull on the belt to raise your injured leg. Do not allow the knee to bend. Keep your opposite leg flat on the floor.  Raise the leg until you feel a gentle  stretch behind your knee or thigh. Hold this position for __________ seconds.  Repeat __________ times. Complete this stretch __________ times per day. STRETCH - Quadriceps, Prone   Lie on your stomach on a firm surface, such as a bed or padded floor.  Bend your knee and grasp your ankle. If you are unable to reach, your ankle or pant leg, use a belt around your foot to lengthen your reach.  Gently pull your heel toward your buttocks. Your knee should not slide out to the side. You should feel a stretch in the front of your thigh and/or knee.  Hold this position for __________ seconds.  Repeat __________ times. Complete this stretch __________ times per day. STRETCHING - Hip Flexors, Lunge Half kneel with your knee on the floor and your opposite knee bent and directly over your ankle.  Keep good posture with your head over your shoulders. Tighten your buttocks to point your tailbone downward; this will prevent your back from arching too much.  You should feel a gentle stretch in the front of your thigh and/or hip. If you do not feel any resistance, slightly slide your opposite foot forward and then slowly lunge forward so your knee once again lines up over your ankle. Be sure your tailbone remains pointed downward.  Hold this stretch for __________ seconds.  Repeat __________ times. Complete this stretch __________ times per day. STRETCH - Adductors, Lunge  While standing, spread your legs  Lean away from your injured leg by bending your opposite knee. You may rest your hands on your thigh for balance.  You should feel a stretch in your inner thigh. Hold for __________ seconds.  Repeat __________ times. Complete this exercise __________ times per day. Document Released: 09/12/2004 Document Revised: 10/28/2011 Document Reviewed: 11/17/2008 [facility] Patient Information 2014 [place], [place].

## 2013-06-09 ENCOUNTER — Other Ambulatory Visit: Payer: Self-pay | Admitting: *Deleted

## 2013-06-09 NOTE — Telephone Encounter (Signed)
Received fax pt needing PA on cyclobenzaprine. Contacted insurance fax over PA form has been completed and fax back. Waiting on approval status...Raechel Chute

## 2013-06-09 NOTE — Telephone Encounter (Signed)
Received PA back med has been approved. Notified pharmacy with approval status.../lmb 

## 2013-06-10 ENCOUNTER — Telehealth: Payer: Self-pay | Admitting: *Deleted

## 2013-06-10 NOTE — Telephone Encounter (Signed)
This seems to be a new problem. Not related to flu or cortisone shot. She should be evaluated

## 2013-06-10 NOTE — Telephone Encounter (Signed)
Spoke with pt, transferred to scheduling for appoint. 

## 2013-06-10 NOTE — Telephone Encounter (Signed)
Pt called states after receiving flu shot and Cortisone Injection on Monday; she started having blood in urine and blood in her stool on Tuesday, states blood in urine has stopped however she continues to experience blood in stool.  Please advise

## 2013-06-14 ENCOUNTER — Ambulatory Visit (INDEPENDENT_AMBULATORY_CARE_PROVIDER_SITE_OTHER): Payer: Medicare Other | Admitting: Internal Medicine

## 2013-06-14 ENCOUNTER — Encounter: Payer: Self-pay | Admitting: Internal Medicine

## 2013-06-14 ENCOUNTER — Other Ambulatory Visit (INDEPENDENT_AMBULATORY_CARE_PROVIDER_SITE_OTHER): Payer: Medicare Other

## 2013-06-14 VITALS — BP 110/72 | HR 58 | Temp 97.5°F | Wt 119.8 lb

## 2013-06-14 DIAGNOSIS — R319 Hematuria, unspecified: Secondary | ICD-10-CM

## 2013-06-14 DIAGNOSIS — T148XXA Other injury of unspecified body region, initial encounter: Secondary | ICD-10-CM

## 2013-06-14 DIAGNOSIS — K921 Melena: Secondary | ICD-10-CM

## 2013-06-14 DIAGNOSIS — IMO0002 Reserved for concepts with insufficient information to code with codable children: Secondary | ICD-10-CM

## 2013-06-14 LAB — CBC WITH DIFFERENTIAL/PLATELET
Basophils Relative: 0.2 % (ref 0.0–3.0)
Eosinophils Relative: 1.9 % (ref 0.0–5.0)
HCT: 41.1 % (ref 36.0–46.0)
Hemoglobin: 14 g/dL (ref 12.0–15.0)
Lymphocytes Relative: 24.3 % (ref 12.0–46.0)
Lymphs Abs: 2.3 10*3/uL (ref 0.7–4.0)
Monocytes Relative: 7.7 % (ref 3.0–12.0)
Neutro Abs: 6.1 10*3/uL (ref 1.4–7.7)
RBC: 4.68 Mil/uL (ref 3.87–5.11)
WBC: 9.3 10*3/uL (ref 4.5–10.5)

## 2013-06-14 LAB — BASIC METABOLIC PANEL
BUN: 23 mg/dL (ref 6–23)
Chloride: 103 mEq/L (ref 96–112)
Creatinine, Ser: 1 mg/dL (ref 0.4–1.2)
GFR: 60.17 mL/min (ref >60.00)
Potassium: 4.3 mEq/L (ref 3.5–5.1)

## 2013-06-14 LAB — URINALYSIS, ROUTINE W REFLEX MICROSCOPIC
Bilirubin Urine: NEGATIVE
Ketones, ur: NEGATIVE
Leukocytes, UA: NEGATIVE
Specific Gravity, Urine: >=1.03 (ref 1.000–1.030)
Total Protein, Urine: NEGATIVE
pH: 5.5 (ref 5.0–8.0)

## 2013-06-14 NOTE — Progress Notes (Signed)
Subjective:    Patient ID: Emily [name], female    DOB: [birth year], 72 y.o.   MRN: [id number]  Rectal Bleeding  The current episode started 5 to 7 days ago. The problem occurs rarely. The problem has been resolved. The patient is experiencing no pain. The stool is described as soft. Associated symptoms include nausea (for 3 days following flu shot last week) and hematuria. Pertinent negatives include no fever, no abdominal pain, no diarrhea, no rectal pain, no vomiting, no vaginal bleeding and no vaginal discharge.  Hematuria Irritative symptoms do not include frequency. Associated symptoms include nausea (for 3 days following flu shot last week). Pertinent negatives include no abdominal pain, chills, dysuria, fever, flank pain or vomiting.    Patient here today for complaints of hematuria and blood in stool.  Pt states hematuria began Tuesday and was a singular episode.  No frequency, urgency, burning or other urinary symptoms.   Blood in stool also began Tuesday and gradually resolved until gone on Friday.  No constipation, diarrhea, pain.  Stools have been normal frequency and texture.  No straining.  Pt does have history of hemorrhoids and anal skin tag, but no bleeding in the past.  Denies abdominal pain.   Review of Systems  Constitutional: Negative for fever, chills, activity change and appetite change.  Gastrointestinal: Positive for nausea (for 3 days following flu shot last week), blood in stool, hematochezia and anal bleeding. Negative for vomiting, abdominal pain, diarrhea, constipation, abdominal distention and rectal pain.  Genitourinary: Positive for hematuria. Negative for dysuria, frequency, flank pain, vaginal bleeding, vaginal discharge and difficulty urinating.       Objective:   Physical Exam  Constitutional: She appears well-developed and well-nourished. No distress.  Cardiovascular: Normal rate, regular rhythm and normal heart sounds.   Pulmonary/Chest: Breath  sounds normal. No respiratory distress.  Abdominal: Soft. Bowel sounds are normal. She exhibits no distension and no mass. There is no tenderness. There is no rebound and no guarding.  Genitourinary: Rectal exam shows no external hemorrhoid, no internal hemorrhoid, no fissure (1.4mm small skintear at the 5 o'clock position, 2 cm from anus, no active bleeding ), no mass, no tenderness and anal tone normal. Guaiac negative stool.  Skin: She is not diaphoretic.    Wt Readings from Last 3 Encounters:  06/14/13 119 lb 12.8 oz (54.341 kg)  11/06/12 117 lb 12.8 oz (53.434 kg)  06/10/12 118 lb (53.524 kg)   BP Readings from Last 3 Encounters:  06/14/13 110/72  06/07/13 110/72  11/06/12 102/72       Assessment & Plan:   1.  Hematuria? - patient with painless blood in toilet after urination for 1 day 6 days ago.  No other symptoms of UTI.  No tenderness or pain in vaginal area. Reports gyn exam in last 12 months normal.  Will check U/A, CBC today to evaluate for infection/anemia. Suspect related to small skin tear as seen on exam - see below  2.  Blood in stool - pt with "bright red" ?blood noted in stool last week from Tuesday to Thursday.  No other red flags in history or exam.  Hemoccult today negative.   Last colonoscopy in 2010 with Dr Juanda Chance that was normal.  Will check CBC today to evaluate for anemia given recent NSAIDs (now off same).  Pt advised to call back if symptoms recur.   3. Skin tear near perineum - mild /small, no active bleeding or inflammation - education on same provided -  cortisone or A&E ointment prn - call if symptomatic

## 2013-06-14 NOTE — Patient Instructions (Signed)
It was good to see you today.  We have reviewed your prior records including labs and tests today  I agree: no more voltaren - we will minimize NSAIDs as needed!  Stool today negative for blood trace but small skin tear seen near your skin tag - use A&E ointment or cortisone as needed for discomfort (if needed)  Test(s) ordered today. Your results will be released to MyChart (or called to you) after review, usually within 72hours after test completion. If any changes need to be made, you will be notified at that same time.

## 2013-06-14 NOTE — Progress Notes (Signed)
Pre-visit discussion using our clinic review tool. No additional management support is needed unless otherwise documented below in the visit note.  

## 2013-07-02 ENCOUNTER — Other Ambulatory Visit: Payer: Self-pay | Admitting: Internal Medicine

## 2013-07-14 ENCOUNTER — Ambulatory Visit (INDEPENDENT_AMBULATORY_CARE_PROVIDER_SITE_OTHER): Payer: Medicare Other | Admitting: Internal Medicine

## 2013-07-14 ENCOUNTER — Other Ambulatory Visit (INDEPENDENT_AMBULATORY_CARE_PROVIDER_SITE_OTHER): Payer: Medicare Other

## 2013-07-14 ENCOUNTER — Encounter: Payer: Self-pay | Admitting: Internal Medicine

## 2013-07-14 VITALS — BP 108/66 | HR 65 | Temp 97.5°F | Ht 62.0 in | Wt 120.0 lb

## 2013-07-14 DIAGNOSIS — E039 Hypothyroidism, unspecified: Secondary | ICD-10-CM

## 2013-07-14 DIAGNOSIS — Z0000 Encounter for general adult medical examination without abnormal findings: Secondary | ICD-10-CM

## 2013-07-14 LAB — LIPID PANEL
Cholesterol: 205 mg/dL — ABNORMAL HIGH (ref 0–200)
HDL: 52 mg/dL (ref >39.00)
Total CHOL/HDL Ratio: 4
Triglycerides: 82 mg/dL (ref 0.0–149.0)

## 2013-07-14 LAB — LDL CHOLESTEROL, DIRECT: Direct LDL: 141.9 mg/dL

## 2013-07-14 LAB — HEPATIC FUNCTION PANEL
AST: 22 U/L (ref 0–37)
Albumin: 4.1 g/dL (ref 3.5–5.2)
Alkaline Phosphatase: 66 U/L (ref 39–117)
Bilirubin, Direct: 0.1 mg/dL (ref 0.0–0.3)
Total Bilirubin: 0.8 mg/dL (ref 0.3–1.2)
Total Protein: 7.6 g/dL (ref 6.0–8.3)

## 2013-07-14 LAB — TSH: TSH: 2.87 u[IU]/mL (ref 0.35–5.50)

## 2013-07-14 NOTE — Patient Instructions (Addendum)
It was good to see you today.  We have reviewed your prior records including labs and tests today  Health Maintenance reviewed - all recommended immunizations and age-appropriate screenings are up-to-date.  Test(s) ordered today. Your results will be released to MyChart (or called to you) after review, usually within 72hours after test completion. If any changes need to be made, you will be notified at that same time.  Medications reviewed and updated, no changes recommended at this time.  Please schedule followup in 12 months for annual exam/labs, call sooner if problems.  Health Maintenance, Female A healthy lifestyle and preventative care can promote health and wellness.  Maintain regular health, dental, and eye exams.  Eat a healthy diet. Foods like vegetables, fruits, whole grains, low-fat dairy products, and lean protein foods contain the nutrients you need without too many calories. Decrease your intake of foods high in solid fats, added sugars, and salt. Get information about a proper diet from your caregiver, if necessary.  Regular physical exercise is one of the most important things you can do for your health. Most adults should get at least 150 minutes of moderate-intensity exercise (any activity that increases your heart rate and causes you to sweat) each week. In addition, most adults need muscle-strengthening exercises on 2 or more days a week.   Maintain a healthy weight. The body mass index (BMI) is a screening tool to identify possible weight problems. It provides an estimate of body fat based on height and weight. Your caregiver can help determine your BMI, and can help you achieve or maintain a healthy weight. For adults 20 years and older:  A BMI below 18.5 is considered underweight.  A BMI of 18.5 to 24.9 is normal.  A BMI of 25 to 29.9 is considered overweight.  A BMI of 30 and above is considered obese.  Maintain normal blood lipids and cholesterol by  exercising and minimizing your intake of saturated fat. Eat a balanced diet with plenty of fruits and vegetables. Blood tests for lipids and cholesterol should begin at age 20 and be repeated every 5 years. If your lipid or cholesterol levels are high, you are over 50, or you are a high risk for heart disease, you may need your cholesterol levels checked more frequently.Ongoing high lipid and cholesterol levels should be treated with medicines if diet and exercise are not effective.  If you smoke, find out from your caregiver how to quit. If you do not use tobacco, do not start.  Lung cancer screening is recommended for adults aged 55 80 years who are at high risk for developing lung cancer because of a history of smoking. Yearly low-dose computed tomography (CT) is recommended for people who have at least a 30-pack-year history of smoking and are a current smoker or have quit within the past 15 years. A pack year of smoking is smoking an average of 1 pack of cigarettes a day for 1 year (for example: 1 pack a day for 30 years or 2 packs a day for 15 years). Yearly screening should continue until the smoker has stopped smoking for at least 15 years. Yearly screening should also be stopped for people who develop a health problem that would prevent them from having lung cancer treatment.  If you are pregnant, do not drink alcohol. If you are breastfeeding, be very cautious about drinking alcohol. If you are not pregnant and choose to drink alcohol, do not exceed 1 drink per day. One drink is considered   to be 12 ounces (355 mL) of beer, 5 ounces (148 mL) of wine, or 1.5 ounces (44 mL) of liquor.  Avoid use of street drugs. Do not share needles with anyone. Ask for help if you need support or instructions about stopping the use of drugs.  High blood pressure causes heart disease and increases the risk of stroke. Blood pressure should be checked at least every 1 to 2 years. Ongoing high blood pressure should be  treated with medicines, if weight loss and exercise are not effective.  If you are 55 to 71 years old, ask your caregiver if you should take aspirin to prevent strokes.  Diabetes screening involves taking a blood sample to check your fasting blood sugar level. This should be done once every 3 years, after age 45, if you are within normal weight and without risk factors for diabetes. Testing should be considered at a younger age or be carried out more frequently if you are overweight and have at least 1 risk factor for diabetes.  Breast cancer screening is essential preventative care for women. You should practice "breast self-awareness." This means understanding the normal appearance and feel of your breasts and may include breast self-examination. Any changes detected, no matter how small, should be reported to a caregiver. Women in their 20s and 30s should have a clinical breast exam (CBE) by a caregiver as part of a regular health exam every 1 to 3 years. After age 40, women should have a CBE every year. Starting at age 40, women should consider having a mammogram (breast X-ray) every year. Women who have a family history of breast cancer should talk to their caregiver about genetic screening. Women at a high risk of breast cancer should talk to their caregiver about having an MRI and a mammogram every year.  Breast cancer gene (BRCA)-related cancer risk assessment is recommended for women who have family members with BRCA-related cancers. BRCA-related cancers include breast, ovarian, tubal, and peritoneal cancers. Having family members with these cancers may be associated with an increased risk for harmful changes (mutations) in the breast cancer genes BRCA1 and BRCA2. Results of the assessment will determine the need for genetic counseling and BRCA1 and BRCA2 testing.  The Pap test is a screening test for cervical cancer. Women should have a Pap test starting at age 21. Between ages 21 and 29, Pap  tests should be repeated every 2 years. Beginning at age 30, you should have a Pap test every 3 years as long as the past 3 Pap tests have been normal. If you had a hysterectomy for a problem that was not cancer or a condition that could lead to cancer, then you no longer need Pap tests. If you are between ages 65 and 70, and you have had normal Pap tests going back 10 years, you no longer need Pap tests. If you have had past treatment for cervical cancer or a condition that could lead to cancer, you need Pap tests and screening for cancer for at least 20 years after your treatment. If Pap tests have been discontinued, risk factors (such as a new sexual partner) need to be reassessed to determine if screening should be resumed. Some women have medical problems that increase the chance of getting cervical cancer. In these cases, your caregiver may recommend more frequent screening and Pap tests.  The human papillomavirus (HPV) test is an additional test that may be used for cervical cancer screening. The HPV test looks for the virus   that can cause the cell changes on the cervix. The cells collected during the Pap test can be tested for HPV. The HPV test could be used to screen women aged 30 years and older, and should be used in women of any age who have unclear Pap test results. After the age of 30, women should have HPV testing at the same frequency as a Pap test.  Colorectal cancer can be detected and often prevented. Most routine colorectal cancer screening begins at the age of 50 and continues through age 75. However, your caregiver may recommend screening at an earlier age if you have risk factors for colon cancer. On a yearly basis, your caregiver may provide home test kits to check for hidden blood in the stool. Use of a small camera at the end of a tube, to directly examine the colon (sigmoidoscopy or colonoscopy), can detect the earliest forms of colorectal cancer. Talk to your caregiver about this at  age 50, when routine screening begins. Direct examination of the colon should be repeated every 5 to 10 years through age 75, unless early forms of pre-cancerous polyps or small growths are found.  Hepatitis C blood testing is recommended for all people born from 1945 through 1965 and any individual with known risks for hepatitis C.  Practice safe sex. Use condoms and avoid high-risk sexual practices to reduce the spread of sexually transmitted infections (STIs). Sexually active women aged 25 and younger should be checked for Chlamydia, which is a common sexually transmitted infection. Older women with new or multiple partners should also be tested for Chlamydia. Testing for other STIs is recommended if you are sexually active and at increased risk.  Osteoporosis is a disease in which the bones lose minerals and strength with aging. This can result in serious bone fractures. The risk of osteoporosis can be identified using a bone density scan. Women ages 65 and over and women at risk for fractures or osteoporosis should discuss screening with their caregivers. Ask your caregiver whether you should be taking a calcium supplement or vitamin D to reduce the rate of osteoporosis.  Menopause can be associated with physical symptoms and risks. Hormone replacement therapy is available to decrease symptoms and risks. You should talk to your caregiver about whether hormone replacement therapy is right for you.  Use sunscreen. Apply sunscreen liberally and repeatedly throughout the day. You should seek shade when your shadow is shorter than you. Protect yourself by wearing long sleeves, pants, a wide-brimmed hat, and sunglasses year round, whenever you are outdoors.  Notify your caregiver of new moles or changes in moles, especially if there is a change in shape or color. Also notify your caregiver if a mole is larger than the size of a pencil eraser.  Stay current with your immunizations. Document Released:  02/18/2011 Document Revised: 11/30/2012 Document Reviewed: 02/18/2011 [employer] Patient Information 2014 [employer].  

## 2013-07-14 NOTE — Progress Notes (Signed)
Pre visit review using our clinic review tool, if applicable. No additional management support is needed unless otherwise documented below in the visit note. 

## 2013-07-14 NOTE — Progress Notes (Signed)
Subjective:    Patient ID: Emily [name], female    DOB: 1941-04-23, 72 y.o.   MRN: [id number]  HPI patient is here today for annual physical. Patient feels well overall.   Diet: heart health Physical activity: exercise 4-5x/week x 30" Depression/mood screen: negative Hearing: intact to whispered voice Visual acuity: grossly normal, performs annual eye exam Hazle Quant) ADLs: capable Fall risk: none Home safety: good Cognitive evaluation: intact to orientation, naming, recall and repetition EOL planning: adv directives, full code/ I agree  I have personally reviewed and have noted 1. The patient's medical and social history 2. Their use of alcohol, tobacco or illicit drugs 3. Their current medications and supplements 4. The patient's functional ability including ADL's, fall risks, home safety risks and hearing or visual impairment. 5. Diet and physical activities 6. Evidence for depression or mood disorders   Also reviewed other chronic med issues  hypothyroid - reports compliance with ongoing medical treatment; changes in medication dose 10/2009 reviewed. denies adverse side effects related to current therapy. no weight or skin changes but chronic fatigue and poor sleep.   Ortho issues - neck, low back, L hip an R shoulder - working with ortho (laundau) and prev spine on same - no weakness, no balance problems or trouble walking - no arm numbness or grip weakenss - improved with rxNSAIDs  palpitations - take atenolol low dose for same - generally controls heart symptoms without dizziness or low BP - no racing or sustained palpitations - worse with cholcolate and caffiene use  -   remote hx breast ca - no recurrence s/p chemo and lumpectomy in 2001 related to same   Past Medical History  Diagnosis Date  . Allergic rhinitis   . Squamous cell carcinoma     History of BC  . Heart palpitations   . Actinic keratosis   . BACK PAIN   . 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 Cervicalgia   . DIVERTICULOSIS, COLON   . ENDOMETRIOSIS   . HYPOTHYROIDISM   . Pleural plaque without asbestos     CT chest 10/2010: R>L lobes, upper> lower  . Cholelithiasis   . Hypertension    Family History  Problem Relation Age of Onset  . Heart disease Father   . Emphysema Father     smoked and worked with asbestos  . Colon cancer Neg Hx    History  Substance Use Topics  . Smoking status: Never Smoker   . Smokeless tobacco: Never Used  . Alcohol Use: No    Review of Systems  Constitutional: Positive for fatigue. Negative for fever and unexpected weight change.  Respiratory: Negative for cough, shortness of breath and wheezing.   Cardiovascular: Negative for chest pain, palpitations and leg swelling.  Gastrointestinal: Negative for nausea, abdominal pain and diarrhea.  Musculoskeletal: Positive for arthralgias.  Neurological: Negative for dizziness, weakness, light-headedness and headaches.  Psychiatric/Behavioral: Negative for dysphoric mood. The patient is not nervous/anxious.   All other systems reviewed and are negative.        Objective:   Physical Exam BP 108/66  Pulse 65  Temp(Src) 97.5 F (36.4 C) (Oral)  Ht 5\' 2"  (1.575 m)  Wt 120 lb (54.432 kg)  BMI 21.94 kg/m2  SpO2 93%  Wt Readings from Last 3 Encounters:  07/14/13 120 lb (54.432 kg)  06/14/13 119 lb 12.8 oz (54.341 kg)  11/06/12 117 lb 12.8 oz (53.434 kg)  Constitutional: She appears well-developed and well-nourished. No distress.  HENT: Head: Normocephalic and atraumatic. Ears: B TMs ok, no erythema or effusion; Nose: Nose normal. Mouth/Throat: Oropharynx is clear and moist. No oropharyngeal exudate.  Eyes: Conjunctivae and EOM are normal. Pupils are equal, round, and reactive to light. No scleral icterus.  Neck: Normal range of motion. Neck supple. No JVD present. No thyromegaly present.  Cardiovascular: Normal rate, regular rhythm and  normal heart sounds.  No murmur heard. No BLE edema. Pulmonary/Chest: Effort normal and breath sounds normal. No respiratory distress. She has no wheezes.  Abdominal: Soft. Bowel sounds are normal. She exhibits no distension. There is no tenderness. no masses Musculoskeletal: R Shoulder: Full range of motion. Neurovascularly intact distally. Good strength with stress of rotator cuff but causes pain. Positive impingement signs. Otherwise, normal range of motion, no joint effusions. No gross deformities Neurological: She is alert and oriented to person, place, and time. No cranial nerve deficit. Coordination normal.  Skin: Skin is warm and dry. No rash noted. No erythema.  Psychiatric: She has a normal mood and affect. Her behavior is normal. Judgment and thought content normal.   Lab Results  Component Value Date   WBC 9.3 06/14/2013   HGB 14.0 06/14/2013   HCT 41.1 06/14/2013   PLT 214.0 06/14/2013   CHOL 210* 06/10/2012   TRIG 77.0 06/10/2012   HDL 51.70 06/10/2012   LDLDIRECT 149.5 06/10/2012   ALT 16 04/01/2012   AST 18 04/01/2012   NA 139 06/14/2013   K 4.3 06/14/2013   CL 103 06/14/2013   CREATININE 1.0 06/14/2013   BUN 23 06/14/2013   CO2 30 06/14/2013   TSH 2.07 11/16/2012       Assessment & Plan:   CPX/v70.0 - Today patient counseled on age appropriate routine health concerns for screening and prevention, each reviewed and up to date or declined. Immunizations reviewed and up to date or declined. Labs ordered and reviewed. Risk factors for depression reviewed and negative. Hearing function and visual acuity are intact. ADLs screened and addressed as needed. Functional ability and level of safety reviewed and appropriate. Education, counseling and referrals performed based on assessed risks today. Patient provided with a copy of personalized plan for preventive services.  Also See problem list. Medications and labs reviewed today.

## 2013-07-14 NOTE — Assessment & Plan Note (Signed)
Lab Results  Component Value Date   TSH 2.07 11/16/2012   The current medical regimen is effective;  continue present plan and medications.

## 2013-07-29 ENCOUNTER — Other Ambulatory Visit: Payer: Self-pay

## 2013-07-29 DIAGNOSIS — Z9889 Other specified postprocedural states: Secondary | ICD-10-CM

## 2013-07-29 DIAGNOSIS — Z1231 Encounter for screening mammogram for malignant neoplasm of breast: Secondary | ICD-10-CM

## 2013-07-29 DIAGNOSIS — Z853 Personal history of malignant neoplasm of breast: Secondary | ICD-10-CM

## 2013-09-01 ENCOUNTER — Encounter (HOSPITAL_COMMUNITY): Payer: Self-pay | Admitting: Emergency Medicine

## 2013-09-01 ENCOUNTER — Emergency Department (HOSPITAL_COMMUNITY)
Admission: EM | Admit: 2013-09-01 | Discharge: 2013-09-01 | Disposition: A | Discharge status: Home / Self Care | Payer: Medicare HMO | Attending: Emergency Medicine | Admitting: Emergency Medicine

## 2013-09-01 ENCOUNTER — Emergency Department (HOSPITAL_COMMUNITY): Payer: Medicare HMO

## 2013-09-01 DIAGNOSIS — S66812A Strain of other specified muscles, fascia and tendons at wrist and hand level, left hand, initial encounter: Secondary | ICD-10-CM

## 2013-09-01 DIAGNOSIS — Z853 Personal history of malignant neoplasm of breast: Secondary | ICD-10-CM | POA: Insufficient documentation

## 2013-09-01 DIAGNOSIS — IMO0002 Reserved for concepts with insufficient information to code with codable children: Secondary | ICD-10-CM | POA: Insufficient documentation

## 2013-09-01 DIAGNOSIS — I10 Essential (primary) hypertension: Secondary | ICD-10-CM | POA: Insufficient documentation

## 2013-09-01 DIAGNOSIS — Y929 Unspecified place or not applicable: Secondary | ICD-10-CM | POA: Insufficient documentation

## 2013-09-01 DIAGNOSIS — Z8709 Personal history of other diseases of the respiratory system: Secondary | ICD-10-CM | POA: Diagnosis not present

## 2013-09-01 DIAGNOSIS — S6980XA Other specified injuries of unspecified wrist, hand and finger(s), initial encounter: Secondary | ICD-10-CM | POA: Insufficient documentation

## 2013-09-01 DIAGNOSIS — X58XXXA Exposure to other specified factors, initial encounter: Secondary | ICD-10-CM | POA: Diagnosis not present

## 2013-09-01 DIAGNOSIS — Z872 Personal history of diseases of the skin and subcutaneous tissue: Secondary | ICD-10-CM | POA: Diagnosis not present

## 2013-09-01 DIAGNOSIS — S638X9A Sprain of other part of unspecified wrist and hand, initial encounter: Secondary | ICD-10-CM

## 2013-09-01 DIAGNOSIS — Z8719 Personal history of other diseases of the digestive system: Secondary | ICD-10-CM | POA: Insufficient documentation

## 2013-09-01 DIAGNOSIS — Z9109 Other allergy status, other than to drugs and biological substances: Secondary | ICD-10-CM | POA: Insufficient documentation

## 2013-09-01 DIAGNOSIS — Z8739 Personal history of other diseases of the musculoskeletal system and connective tissue: Secondary | ICD-10-CM | POA: Insufficient documentation

## 2013-09-01 DIAGNOSIS — S66819A Strain of other specified muscles, fascia and tendons at wrist and hand level, unspecified hand, initial encounter: Secondary | ICD-10-CM

## 2013-09-01 DIAGNOSIS — S6990XA Unspecified injury of unspecified wrist, hand and finger(s), initial encounter: Secondary | ICD-10-CM | POA: Insufficient documentation

## 2013-09-01 DIAGNOSIS — Z8742 Personal history of other diseases of the female genital tract: Secondary | ICD-10-CM | POA: Diagnosis not present

## 2013-09-01 DIAGNOSIS — S66811A Strain of other specified muscles, fascia and tendons at wrist and hand level, right hand, initial encounter: Secondary | ICD-10-CM

## 2013-09-01 DIAGNOSIS — Y939 Activity, unspecified: Secondary | ICD-10-CM | POA: Diagnosis not present

## 2013-09-01 DIAGNOSIS — E039 Hypothyroidism, unspecified: Secondary | ICD-10-CM | POA: Insufficient documentation

## 2013-09-01 DIAGNOSIS — Z79899 Other long term (current) drug therapy: Secondary | ICD-10-CM | POA: Insufficient documentation

## 2013-09-01 NOTE — ED Notes (Signed)
An hour ago, pt feels like she may have dislocated rt pinky finger.  Finger has altered shape and is bruising.

## 2013-09-01 NOTE — ED Notes (Signed)
Ortho tech called for bilateral finger splints.

## 2013-09-01 NOTE — Discharge Instructions (Signed)
Extensor Carpi Ulnaris Instability Injury to the extensor carpi ulnaris (ECU) tendon of the wrist may result in ECU instability. In this condition, the ECU is displaced from its normal spot on the little finger side (ulnar side) of the back of the wrist. The ECU tendon attaches the ECU muscle to the bone. The ECU straightens and rotates (abducts) the wrist. The ECU is important for gripping and pulling. Normally, the ECU tendon slides in a grove over the forearm, on the little finger side. A ligament-like structure (retinaculum) holds it in place. Damage to the retinaculum causes the ECU tendon to slip in and out of the groove (sublux) or to completely come out of the groove (dislocate). SYMPTOMS   Painful "snapping" feeling over the back of the wrist, on the little finger side. Usually, when turning the palm up or down.  Inflammation or bruising at the injury site (uncommon).  Often, few or no symptoms. CAUSES   This is often an overuse injury. It is caused by repetitive and excessive turning of the palm upward. Also, forceful turning up of the palm, sometimes while bending toward the little finger, may be a cause.  Birth defect (congenital abnormality), such as shallow or malformed groove for the tendons. RISK INCREASES WITH:   Sports that cause repeated, forceful turning up of the palm (i.e. tennis, golf, rodeo riding, weightlifting, football).  Previous wrist injury or restraint.  Poor wrist strength and flexibility. PREVENTION   Warm up and stretch properly before activity.  Maintain physical fitness:  Hand, wrist, and forearm strength.  Flexibility and endurance.  Cardiovascular fitness.  When playing high risk sports, protect the wrist with supportive devices (wrapped elastic bandages, tape, braces).  Full recovery must be complete, before returning to practice or competition. PROGNOSIS  Some people may have no symptoms or limitations and do not need treatment. However, if  ECU instability causes chronic pain and impairment, full recovery can be expected with proper treatment. Surgery may be needed to stop partial dislocations (subluxations). Returning to sports may take 6 to 9 months.  RELATED COMPLICATIONS   Chronic pain, impairment, and recurring full or partial dislocation.  Tearing of the tendon, due to friction wear from recurring full or partial dislocation.  Wrist weakness.  Prolonged impairment. TREATMENT  Treatment first involves the use of ice and medicine, to reduce pain and inflammation. Also, raising the injured wrist to a level above the heart helps. A brace, splint, or taping may be advised to reduce pain during activity. If non-surgical treatment does not work, surgery is often advised, to repair the retinaculum, tendon lining (tendon sheath), tendon, or to replace the tendon (reconstruction), if the tendon is torn. After restraint (with or without surgery), stretching and strengthening exercises are needed. Exercises may be performed at home or with a therapist. MEDICATION   If pain medicine is needed, nonsteroidal anti-inflammatory medicines (aspirin and ibuprofen), or other minor pain relievers (acetaminophen), are often advised.  Do not take pain medicine for 7 days before surgery.  Prescription pain relievers are usually prescribed only after surgery. Use only as directed and only as much as needed. COLD THERAPY  Cold treatment (icing) relieves pain and reduces inflammation. Cold treatment should be applied for 10 to 15 minutes every 2 to 3 hours, and immediately after activity that aggravates your symptoms. Use ice packs or an ice massage. SEEK MEDICAL CARE IF:   Pain, tenderness, or swelling gets worse, despite treatment.  You experience pain, numbness, or coldness. Blue, gray,  or dark color appears in the fingernails.  Any of the following occur after surgery:  Signs of infection: fever, increased pain, swelling, redness, drainage  of fluids, or bleeding in the affected area.  New, unexplained symptoms develop. (Drugs used in treatment may produce side effects.) Document Released: 08/05/2005 Document Revised: 10/28/2011 Document Reviewed: 11/17/2008 [facility] Patient Information 2014 [place], [place].

## 2013-09-01 NOTE — ED Provider Notes (Signed)
CSN: [id number]     Arrival date & time 09/01/13  1705 History  This chart was scribed for non-physician practitioner, Idalia Needle. Joelyn Oms, PA-C working with Tanna Furry, MD by Frederich Balding, ED scribe. This patient was seen in room WTR7/WTR7 and the patient's care was started at 5:33 PM.    Chief Complaint  Patient presents with  . Finger Injury   The history is provided by the patient. No language interpreter was used.   HPI Comments: Emily [name] is a 73 y.o. female who presents to the Emergency Department complaining of possible dislocation of her right pinky finger. She states she might have hit her finger on something but denies specific injury to her finger. Pt has mild right pinky finger pain. Her left pinky finger has the same looking dislocation but pt states it has never given her a problem. Denies numbness or tingling.   Past Medical History  Diagnosis Date  . Allergic rhinitis   . Squamous cell carcinoma     History of BC  . Heart palpitations   . Actinic keratosis   . BACK PAIN   . 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 Cervicalgia   . DIVERTICULOSIS, COLON   . ENDOMETRIOSIS   . HYPOTHYROIDISM   . Pleural plaque without asbestos     CT chest 10/2010: R>L lobes, upper> lower  . Cholelithiasis   . Hypertension    Past Surgical History  Procedure Laterality Date  . Cholecystectomy  2010  . Endometrial ablation    . Breast lumpectomy  2001    left  . Tubal ligation     Family History  Problem Relation Age of Onset  . Heart disease Father   . Emphysema Father     smoked and worked with asbestos  . Colon cancer Neg Hx    History  Substance Use Topics  . Smoking status: Never Smoker   . Smokeless tobacco: Never Used  . Alcohol Use: No   OB History   Grav Para Term Preterm Abortions TAB SAB Ect Mult Living                 Review of Systems  Musculoskeletal: Positive for arthralgias.  Neurological:  Negative for numbness.  All other systems reviewed and are negative.    Allergies  Prednisone  Home Medications   Current Outpatient Rx  Name  Route  Sig  Dispense  Refill  . atenolol (TENORMIN) 25 MG tablet      TAKE ONE TABLET BY MOUTH ONE TIME DAILY    90 tablet   1   . Calcium-Vitamin D-Vitamin K (CALCIUM + D + K) 750-500-40 MG-UNT-MCG TABS   Oral   Take 1 tablet by mouth 2 (two) times daily.           . cyclobenzaprine (FLEXERIL) 5 MG tablet   Oral   Take 1 tablet (5 mg total) by mouth every 8 (eight) hours as needed for muscle spasms.   30 tablet   1   . diclofenac (VOLTAREN) 75 MG EC tablet   Oral   Take 1 tablet (75 mg total) by mouth 2 (two) times daily as needed.   30 tablet   0   . levothyroxine (SYNTHROID, LEVOTHROID) 75 MCG tablet      Take one tablet by mouth one time daily   90 tablet   0   . Multiple Vitamin (MULTIVITAMIN) capsule  Oral   Take 1 capsule by mouth daily.           . ondansetron (ZOFRAN) 4 MG tablet   Oral   Take 1 tablet (4 mg total) by mouth every 8 (eight) hours as needed for nausea.   20 tablet   0    BP 122/79  Pulse 70  Temp(Src) 98 F (36.7 C) (Oral)  Resp 20  SpO2 98%  Physical Exam  Nursing note and vitals reviewed. Constitutional: She is oriented to person, place, and time. She appears well-developed and well-nourished. No distress.  HENT:  Head: Normocephalic.  Mouth/Throat: Oropharynx is clear and moist.  Eyes: Conjunctivae are normal. No scleral icterus.  Pulmonary/Chest: Effort normal.  Musculoskeletal:       Left hand: She exhibits decreased range of motion, tenderness, bony tenderness and deformity. She exhibits normal two-point discrimination and normal capillary refill. Normal sensation noted. Normal strength noted. She exhibits no finger abduction, no thumb/finger opposition and no wrist extension trouble.       Hands: Neurological: She is alert and oriented to person, place, and time. She  exhibits normal muscle tone. Coordination normal.  Skin: Skin is warm and dry. No rash noted. No erythema. No pallor.  Psychiatric: She has a normal mood and affect. Her behavior is normal. Judgment and thought content normal.    ED Course  Procedures (including critical care time)  DIAGNOSTIC STUDIES: Oxygen Saturation is 98% on RA, normal by my interpretation.    COORDINATION OF CARE: 5:37 PM-Discussed treatment plan which includes xray with pt at bedside and pt agreed to plan.   Labs Review Labs Reviewed - No data to display Imaging Review Dg Hand Complete Left  09/01/2013   CLINICAL DATA:  Left hand injury.  EXAM: LEFT HAND - COMPLETE 3+ VIEW  COMPARISON:  None.  FINDINGS: A flexion deformity is noted at the DIP joint of the fifth digit most likely due to a extensor tendon rupture. There are moderate osteoarthritic type degenerative changes involving the DIP and PIP joints of the fingers. No acute fractures identified.  IMPRESSION: Flexion deformity at the DIP joint of the fifth digit likely due to a extensor tendon rupture.  Degenerative changes but no acute bony findings.   Electronically Signed   By: Kalman Jewels M.D.   On: 09/01/2013 18:02   Dg Finger Little Right  09/01/2013   CLINICAL DATA:  Injured small finger.  EXAM: RIGHT LITTLE FINGER 2+V  COMPARISON:  None.  FINDINGS: There is a flexion deformity at the DIP joint which is most likely due to an extensor tendon rupture. No acute fracture. Moderate degenerative changes at the DIP and PIP joints.  IMPRESSION: Flexion deformity at the DIP joint suggesting an extensor tendon rupture.   Electronically Signed   By: Kalman Jewels M.D.   On: 09/01/2013 17:53    EKG Interpretation   None       MDM  Bilateral extensor tendon rupture  Patient initially here after injurying her right 5th finger, she then also noted the left one "has been like this for a while" without any known injury.  Bilateral extensor tendon rupture - no  fracture, both splinted in extension and will follow up with Dr. Caralyn Guile on an outpatient basis.  I personally performed the services described in this documentation, which was scribed in my presence. The recorded information has been reviewed and is accurate.   Idalia Needle Joelyn Oms, [place] 09/01/13 1858

## 2013-09-03 ENCOUNTER — Ambulatory Visit
Admission: RE | Admit: 2013-09-03 | Discharge: 2013-09-03 | Disposition: A | Discharge status: Home / Self Care | Payer: Medicare HMO | Source: Ambulatory Visit

## 2013-09-03 DIAGNOSIS — Z853 Personal history of malignant neoplasm of breast: Secondary | ICD-10-CM

## 2013-09-03 DIAGNOSIS — Z1231 Encounter for screening mammogram for malignant neoplasm of breast: Secondary | ICD-10-CM

## 2013-09-03 DIAGNOSIS — Z9889 Other specified postprocedural states: Secondary | ICD-10-CM

## 2013-09-04 NOTE — ED Provider Notes (Signed)
Medical screening examination/treatment/procedure(s) were performed by non-physician practitioner and as supervising physician I was immediately available for consultation/collaboration.  EKG Interpretation   None         Tanna Furry, MD 09/04/13 [phone]

## 2013-09-10 ENCOUNTER — Other Ambulatory Visit: Payer: Self-pay | Admitting: Internal Medicine

## 2013-11-03 ENCOUNTER — Ambulatory Visit (INDEPENDENT_AMBULATORY_CARE_PROVIDER_SITE_OTHER): Payer: Medicare HMO | Admitting: Internal Medicine

## 2013-11-03 ENCOUNTER — Encounter: Payer: Self-pay | Admitting: Internal Medicine

## 2013-11-03 VITALS — BP 106/70 | HR 90 | Temp 97.9°F | Wt 120.1 lb

## 2013-11-03 DIAGNOSIS — J020 Streptococcal pharyngitis: Secondary | ICD-10-CM

## 2013-11-03 MED ORDER — AMOXICILLIN 500 MG PO CAPS: 500.0000 mg | ORAL_CAPSULE | Freq: Three times a day (TID) | ORAL | Status: DC

## 2013-11-03 NOTE — Progress Notes (Signed)
   Subjective:    Patient ID: Emily [name], female    DOB: 10-20-1940, 73 y.o.   MRN: [id number]  HPI  Symptoms began Sunday night as bilateral sore throat. Reports sore throat pain goes into postauricular lymph node area. Clear nasal secretions, Denies cough. Fever 101 last night with chills. She took Tylenol and Zyrtec.  Sick contacts include her granddaughter, whom she watches over every afternoon. Her granddaughter began ABX for strep throat yesterday, and she stayed with her all day. She reports some frontal pressure/pain; no dental pain, no maxillary pain.   Review of Systems  She denies cough, purulent secretions, otorrhea.     Objective:   Physical Exam General appearance:good health ;well nourished; no acute distress or increased work of breathing is present.  No  lymphadenopathy about the head, neck, or axilla noted.   Eyes: No conjunctival inflammation or lid edema is present. There is no scleral icterus.  Ears:  External ear exam shows no significant lesions or deformities.  Otoscopic examination reveals clear canals, tympanic membranes are intact bilaterally without bulging, retraction, inflammation or discharge.  Nose:  External nasal examination shows no deformity or inflammation. Nasal mucosa are pink and moist without lesions; clear secretions noted bilaterally. No septal dislocation or deviation.No obstruction to airflow.    Oral exam: Dental hygiene is good; lips and gums are healthy appearing.There is slight oropharyngeal erythema without exudate noted.   Neck:  No deformities, thyromegaly, masses.   Supple with full range of motion without pain.   Heart:  Normal rate and regular rhythm. S1 and S2 normal without gallop, murmur, click, rub or other extra sounds.   Lungs:Chest clear to auscultation; no wheezes, rhonchi,rales ,or rubs present. No increased work of breathing.    Extremities:  No cyanosis, edema, or clubbing  noted    Skin: Warm & dry w/o jaundice or  tenting.     Assessment & Plan:  #1 high index of suspicion for strep throat - PCN

## 2013-11-03 NOTE — Patient Instructions (Signed)
Zicam Melts or Zinc lozenges as per package label for scratchy throat .     Plain Mucinex (NOT D) for thick secretions ;force NON dairy fluids .   Nasal cleansing in the shower as discussed with lather of mild shampoo.After 10 seconds wash off lather while  exhaling through nostrils. Make sure that all residual soap is removed to prevent irritation.  Flonase OR Nasacort AQ 1 spray in each nostril twice a day as needed. Use the "crossover" technique into opposite nostril spraying toward opposite ear @ 45 degree angle, not straight up into nostril.  Use a Neti pot daily only  as needed for significant sinus congestion; going from open side to congested side . Plain Allegra (NOT D )  160 daily , Loratidine 10 mg , OR Zyrtec 10 mg @ bedtime  as needed for itchy eyes & sneezing.

## 2013-11-03 NOTE — Progress Notes (Signed)
   Subjective:    Patient ID: Emily [name], female    DOB: 08-Sep-1940, 73 y.o.   MRN: [id number]  HPI Symptoms began Sunday night as bilateral sore throat. Reports sore throat pain goes into postauricular lymph node area. Clear nasal secretions, Denies cough. Fever 101 last night with chills. She took Tylenol and Zyrtec.  Sick contacts include her granddaughter, whom she watches over every afternoon. Her granddaughter began ABX for strep throat yesterday, and she stayed with her all day. She reports some frontal pressure/pain; no dental pain, no maxillary pain.     Review of Systems She denies cough, purulent secretions, otorrhea.      Objective:   Physical Exam General appearance:good health ;well nourished; no acute distress or increased work of breathing is present. No lymphadenopathy about the head, neck, or axilla noted.  Eyes: No conjunctival inflammation or lid edema is present. There is no scleral icterus.  Ears: External ear exam shows no significant lesions or deformities. Otoscopic examination reveals clear canals, tympanic membranes are intact bilaterally without bulging, retraction, inflammation or discharge.  Nose: External nasal examination shows no deformity or inflammation. Nasal mucosa are dry without lesions; clear secretions noted bilaterally. No septal dislocation or deviation.No obstruction to airflow. Hyponasal speech Oral exam: Dental hygiene is good; lips and gums are healthy appearing.There is slight oropharyngeal erythema without exudate noted.  Neck: No deformities, masses. Supple with full range of motion without pain.  Heart: Normal rate and regular rhythm. S1 and S2 normal without gallop, murmur, click, rub or other extra sounds.  Lungs:Chest clear to auscultation except isolated inspiratory pop. No increased work of breathing.  Extremities: No cyanosis, edema, or clubbing noted  Skin: Warm & dry     Assessment & Plan:  #1 high index of suspicion for strep  throat - PCN

## 2013-11-03 NOTE — Progress Notes (Signed)
Pre visit review using our clinic review tool, if applicable. No additional management support is needed unless otherwise documented below in the visit note. 

## 2013-12-06 ENCOUNTER — Other Ambulatory Visit: Payer: Self-pay | Admitting: Internal Medicine

## 2014-04-13 ENCOUNTER — Telehealth: Payer: Self-pay | Admitting: Internal Medicine

## 2014-04-13 NOTE — Telephone Encounter (Signed)
Patient Information:  Caller Name: Kimbra Marcelino  Phone: [phone]  Patient: Emily [name], Emily [name]  Gender: Female  DOB: 06/12/1941  Age: 73 Years  PCP: Gwendolyn Grant (Adults only)  Office Follow Up:  Does the office need to follow up with this patient?: Yes  Instructions For The Office: Patient with constant pressure in center of chest going thru to back Patient refusing 911 recommendation. Please review - constact patient as soon as possible  [phone].   Symptoms  Reason For Call & Symptoms: Has had upper back discomfort for extended time.  Yesterday 8/25 started with what she feels is digestive problem, some burping, increased gas.  Has been having 2 types of center chest pain with this - first sharp shooting pains in center of chest gone within seconds.  Second, constant pressure in center of chest that goes thru to upper back, described as dull pressure.  If takes deep breath pain worse in back.  Reviewed Health History In EMR: Yes  Reviewed Medications In EMR: Yes  Reviewed Allergies In EMR: Yes  Reviewed Surgeries / Procedures: Yes  Date of Onset of Symptoms: 04/12/2014  Guideline(s) Used:  Chest Pain  Disposition Per Guideline:   Call EMS 911 Now  Reason For Disposition Reached:   Chest pain lasting longer than 5 minutes and ANY of the following:  Over 6 years old Over 48 years old and at least one cardiac risk factor (i.e., high blood pressure, diabetes, high cholesterol, obesity, smoker or strong family history of heart disease) Pain is crushing, pressure-like, or heavy  Took nitroglycerin and chest pain was not relieved History of heart disease (i.e., angina, heart attack, bypass surgery, angioplasty, CHF)  Advice Given:  N/A  Patient Refused Recommendation:  Patient Refused Care Advice  wanting appointment in office instead

## 2014-04-13 NOTE — Telephone Encounter (Signed)
Called and made an appointment with Plotnikov.

## 2014-04-14 ENCOUNTER — Encounter: Payer: Self-pay | Admitting: Internal Medicine

## 2014-04-14 ENCOUNTER — Other Ambulatory Visit (INDEPENDENT_AMBULATORY_CARE_PROVIDER_SITE_OTHER): Payer: Medicare HMO

## 2014-04-14 ENCOUNTER — Telehealth: Payer: Self-pay | Admitting: Geriatric Medicine

## 2014-04-14 ENCOUNTER — Ambulatory Visit (INDEPENDENT_AMBULATORY_CARE_PROVIDER_SITE_OTHER): Payer: Medicare HMO | Admitting: Internal Medicine

## 2014-04-14 ENCOUNTER — Ambulatory Visit (INDEPENDENT_AMBULATORY_CARE_PROVIDER_SITE_OTHER)
Admission: RE | Admit: 2014-04-14 | Discharge: 2014-04-14 | Disposition: A | Discharge status: Home / Self Care | Payer: Medicare HMO | Source: Ambulatory Visit | Attending: Internal Medicine | Admitting: Internal Medicine

## 2014-04-14 VITALS — BP 110/80 | HR 80 | Temp 97.5°F | Resp 16 | Wt 121.0 lb

## 2014-04-14 DIAGNOSIS — R079 Chest pain, unspecified: Secondary | ICD-10-CM

## 2014-04-14 DIAGNOSIS — K2100 Gastro-esophageal reflux disease with esophagitis, without bleeding: Secondary | ICD-10-CM

## 2014-04-14 DIAGNOSIS — R072 Precordial pain: Secondary | ICD-10-CM

## 2014-04-14 DIAGNOSIS — R0789 Other chest pain: Secondary | ICD-10-CM

## 2014-04-14 DIAGNOSIS — K219 Gastro-esophageal reflux disease without esophagitis: Secondary | ICD-10-CM | POA: Insufficient documentation

## 2014-04-14 LAB — BASIC METABOLIC PANEL
BUN: 20 mg/dL (ref 6–23)
CHLORIDE: 103 meq/L (ref 96–112)
CO2: 29 meq/L (ref 19–32)
Calcium: 10.3 mg/dL (ref 8.4–10.5)
Creatinine, Ser: 0.9 mg/dL (ref 0.4–1.2)
GFR: 63.81 mL/min (ref >60.00)
Glucose, Bld: 82 mg/dL (ref 70–99)
POTASSIUM: 4.2 meq/L (ref 3.5–5.1)
SODIUM: 138 meq/L (ref 135–145)

## 2014-04-14 LAB — CBC WITH DIFFERENTIAL/PLATELET
BASOS ABS: 0.1 10*3/uL (ref 0.0–0.1)
Basophils Relative: 0.6 % (ref 0.0–3.0)
Eosinophils Absolute: 0.2 10*3/uL (ref 0.0–0.7)
Eosinophils Relative: 2 % (ref 0.0–5.0)
HCT: 44.6 % (ref 36.0–46.0)
HEMOGLOBIN: 15.1 g/dL — AB (ref 12.0–15.0)
Lymphocytes Relative: 23.9 % (ref 12.0–46.0)
Lymphs Abs: 2.3 10*3/uL (ref 0.7–4.0)
MCHC: 33.8 g/dL (ref 30.0–36.0)
MCV: 87.6 fl (ref 78.0–100.0)
MONO ABS: 0.9 10*3/uL (ref 0.1–1.0)
Monocytes Relative: 9.5 % (ref 3.0–12.0)
NEUTROS ABS: 6.2 10*3/uL (ref 1.4–7.7)
Neutrophils Relative %: 64 % (ref 43.0–77.0)
PLATELETS: 209 10*3/uL (ref 150.0–400.0)
RBC: 5.09 Mil/uL (ref 3.87–5.11)
RDW: 13.3 % (ref 11.5–15.5)
WBC: 9.6 10*3/uL (ref 4.0–10.5)

## 2014-04-14 LAB — HEPATIC FUNCTION PANEL
ALT: 16 U/L (ref 0–35)
AST: 19 U/L (ref 0–37)
Albumin: 4.4 g/dL (ref 3.5–5.2)
Alkaline Phosphatase: 76 U/L (ref 39–117)
BILIRUBIN TOTAL: 0.7 mg/dL (ref 0.2–1.2)
Bilirubin, Direct: 0.1 mg/dL (ref 0.0–0.3)
Total Protein: 8.2 g/dL (ref 6.0–8.3)

## 2014-04-14 MED ORDER — ESOMEPRAZOLE MAGNESIUM 40 MG PO CPDR: 40.0000 mg | DELAYED_RELEASE_CAPSULE | Freq: Every day | ORAL | Status: DC

## 2014-04-14 NOTE — Patient Instructions (Signed)
Go to ER if worse! No large pills!

## 2014-04-14 NOTE — Telephone Encounter (Signed)
Message copied by Alger Memos on Thu Apr 14, 2014  2:37 PM ------      Message from: Cassandria Anger      Created: Thu Apr 14, 2014 12:49 PM       Erline Levine, please, inform patient that her CXR and initial labs are normal.      Thx       ------

## 2014-04-14 NOTE — Progress Notes (Signed)
   Subjective:    Patient ID: Emily [name], female    DOB: April 13, 1941, 73 y.o.   MRN: [id number]  Chest Pain  This is a new problem. The current episode started in the past 7 days. The onset quality is gradual. The problem occurs constantly. The pain is present in the epigastric region and substernal region. The pain is moderate. The quality of the pain is described as dull (and sharp). The pain radiates to the mid back. Pertinent negatives include no abdominal pain, back pain, diaphoresis, irregular heartbeat, numbness, orthopnea, palpitations, shortness of breath or weakness. The pain is aggravated by breathing (eating). She has tried nothing for the symptoms.  Her past medical history is significant for cancer.  Pertinent negatives for past medical history include no aneurysm, no aortic dissection, no CAD and no hypertension.  worse w/swallowng    Review of Systems  Constitutional: Negative for diaphoresis.  HENT: Positive for trouble swallowing.   Respiratory: Negative for choking and shortness of breath.   Cardiovascular: Positive for chest pain. Negative for palpitations and orthopnea.  Gastrointestinal: Negative for abdominal pain, diarrhea and abdominal distention.  Genitourinary: Negative for frequency.  Musculoskeletal: Negative for back pain.  Neurological: Negative for weakness and numbness.  Psychiatric/Behavioral: Negative for agitation. The patient is not nervous/anxious.        Objective:   Physical Exam  Constitutional: She appears well-developed. No distress.  HENT:  Head: Normocephalic.  Right Ear: External ear normal.  Left Ear: External ear normal.  Nose: Nose normal.  Mouth/Throat: Oropharynx is clear and moist.  Eyes: Conjunctivae are normal. Pupils are equal, round, and reactive to light. Right eye exhibits no discharge. Left eye exhibits no discharge.  Neck: Normal range of motion. Neck supple. No JVD present. No tracheal deviation present. No thyromegaly  present.  Cardiovascular: Normal rate, regular rhythm and normal heart sounds.   Pulmonary/Chest: No stridor. No respiratory distress. She has no wheezes.  Abdominal: Soft. Bowel sounds are normal. She exhibits no distension and no mass. There is no tenderness. There is no rebound and no guarding.  Musculoskeletal: She exhibits no edema and no tenderness.  Lymphadenopathy:    She has no cervical adenopathy.  Neurological: She displays normal reflexes. No cranial nerve deficit. She exhibits normal muscle tone. Coordination normal.  Skin: No rash noted. No erythema.  Psychiatric: She has a normal mood and affect. Her behavior is normal. Judgment and thought content normal.  Chest wall - not tender Epigastric area is painful a little  Lab Results  Component Value Date   WBC 9.6 04/14/2014   HGB 15.1* 04/14/2014   HCT 44.6 04/14/2014   PLT 209.0 04/14/2014   GLUCOSE 82 04/14/2014   CHOL 205* 07/14/2013   TRIG 82.0 07/14/2013   HDL 52.00 07/14/2013   LDLDIRECT 141.9 07/14/2013   LDLCALC 133* 12/18/2009   ALT 16 04/14/2014   AST 19 04/14/2014   NA 138 04/14/2014   K 4.2 04/14/2014   CL 103 04/14/2014   CREATININE 0.9 04/14/2014   BUN 20 04/14/2014   CO2 29 04/14/2014   TSH 2.87 07/14/2013   EKG ok     Assessment & Plan:

## 2014-04-14 NOTE — Assessment & Plan Note (Signed)
8/15 ?etiology - poss GI related EKG - ok CXR Labs Start ASA, Nexium Pt has refused ER

## 2014-04-14 NOTE — Assessment & Plan Note (Signed)
Start Nexium Stop Ca and a MVI for now

## 2014-04-14 NOTE — Progress Notes (Signed)
Pre visit review using our clinic review tool, if applicable. No additional management support is needed unless otherwise documented below in the visit note. 

## 2014-04-14 NOTE — Telephone Encounter (Signed)
Patient is aware of normal CXR and labs.

## 2014-04-14 NOTE — Telephone Encounter (Signed)
Message copied by Alger Memos on Thu Apr 14, 2014  4:20 PM ------      Message from: Cassandria Anger      Created: Thu Apr 14, 2014 12:49 PM       Erline Levine, please, inform patient that her CXR and initial labs are normal.      Thx       ------

## 2014-04-14 NOTE — Telephone Encounter (Signed)
LMOM asking patient to call me back. She needs to be informed of normal labs and CXR.

## 2014-04-15 LAB — CK TOTAL AND CKMB (NOT AT ARMC)
CK TOTAL: 131 U/L (ref 7–177)
CK, MB: 5.9 ng/mL — AB (ref 0.0–5.0)
Relative Index: 4.5 — ABNORMAL HIGH (ref 0.0–4.0)

## 2014-04-15 LAB — D-DIMER, QUANTITATIVE (NOT AT ARMC): D DIMER QUANT: 0.32 ug{FEU}/mL (ref 0.00–0.48)

## 2014-04-26 ENCOUNTER — Encounter: Payer: Self-pay | Admitting: Internal Medicine

## 2014-04-26 ENCOUNTER — Ambulatory Visit (INDEPENDENT_AMBULATORY_CARE_PROVIDER_SITE_OTHER): Payer: Medicare HMO | Admitting: Internal Medicine

## 2014-04-26 VITALS — BP 102/66 | HR 68 | Temp 98.5°F | Resp 16 | Wt 123.0 lb

## 2014-04-26 DIAGNOSIS — R072 Precordial pain: Secondary | ICD-10-CM

## 2014-04-26 DIAGNOSIS — K2100 Gastro-esophageal reflux disease with esophagitis, without bleeding: Secondary | ICD-10-CM

## 2014-04-26 DIAGNOSIS — E039 Hypothyroidism, unspecified: Secondary | ICD-10-CM

## 2014-04-26 DIAGNOSIS — D582 Other hemoglobinopathies: Secondary | ICD-10-CM

## 2014-04-26 DIAGNOSIS — R0789 Other chest pain: Secondary | ICD-10-CM

## 2014-04-26 MED ORDER — RANITIDINE HCL 150 MG PO TABS: 150.0000 mg | ORAL_TABLET | Freq: Two times a day (BID) | ORAL | Status: DC

## 2014-04-26 NOTE — Assessment & Plan Note (Signed)
Continue with current prescription therapy as reflected on the Med list.  

## 2014-04-26 NOTE — Progress Notes (Signed)
   Subjective:    Patient ID: Emily [name], female    DOB: 07/13/1941, 73 y.o.   MRN: [id number]  HPI C/o some discomfort w/swallowng - was bette on Nexium, however she developed loose stools on Nexium and stopped    Review of Systems  HENT: Positive for trouble swallowing.   Respiratory: Negative for choking.   Gastrointestinal: Negative for diarrhea and abdominal distention.  Genitourinary: Negative for frequency.  Psychiatric/Behavioral: Negative for agitation. The patient is not nervous/anxious.        Objective:   Physical Exam  Constitutional: She appears well-developed. No distress.  HENT:  Head: Normocephalic.  Right Ear: External ear normal.  Left Ear: External ear normal.  Nose: Nose normal.  Mouth/Throat: Oropharynx is clear and moist.  Eyes: Conjunctivae are normal. Pupils are equal, round, and reactive to light. Right eye exhibits no discharge. Left eye exhibits no discharge.  Neck: Normal range of motion. Neck supple. No JVD present. No tracheal deviation present. No thyromegaly present.  Cardiovascular: Normal rate, regular rhythm and normal heart sounds.   Pulmonary/Chest: No stridor. No respiratory distress. She has no wheezes.  Abdominal: Soft. Bowel sounds are normal. She exhibits no distension and no mass. There is no tenderness. There is no rebound and no guarding.  Musculoskeletal: She exhibits no edema and no tenderness.  Lymphadenopathy:    She has no cervical adenopathy.  Neurological: She displays normal reflexes. No cranial nerve deficit. She exhibits normal muscle tone. Coordination normal.  Skin: No rash noted. No erythema.  Psychiatric: She has a normal mood and affect. Her behavior is normal. Judgment and thought content normal.  Chest wall - not tender Epigastric area is not painful   Lab Results  Component Value Date   WBC 9.6 04/14/2014   HGB 15.1* 04/14/2014   HCT 44.6 04/14/2014   PLT 209.0 04/14/2014   GLUCOSE 82 04/14/2014   CHOL 205*  07/14/2013   TRIG 82.0 07/14/2013   HDL 52.00 07/14/2013   LDLDIRECT 141.9 07/14/2013   LDLCALC 133* 12/18/2009   ALT 16 04/14/2014   AST 19 04/14/2014   NA 138 04/14/2014   K 4.2 04/14/2014   CL 103 04/14/2014   CREATININE 0.9 04/14/2014   BUN 20 04/14/2014   CO2 29 04/14/2014   TSH 2.87 07/14/2013   EKG, CXR - ok     Assessment & Plan:

## 2014-04-26 NOTE — Assessment & Plan Note (Signed)
9/15 ?etiol CBC in 2 mo

## 2014-04-26 NOTE — Progress Notes (Deleted)
Pre visit review using our clinic review tool, if applicable. No additional management support is needed unless otherwise documented below in the visit note. 

## 2014-04-26 NOTE — Assessment & Plan Note (Signed)
D/c Nexium due to diarrhea Start Zantac GI consult Dr Olevia Perches -

## 2014-04-26 NOTE — Assessment & Plan Note (Signed)
?  etiology - poss GI related  - resolved GI cons Dr Olevia Perches

## 2014-05-05 ENCOUNTER — Encounter: Payer: Self-pay | Admitting: *Deleted

## 2014-05-31 ENCOUNTER — Encounter: Payer: Self-pay | Admitting: Internal Medicine

## 2014-05-31 ENCOUNTER — Ambulatory Visit (INDEPENDENT_AMBULATORY_CARE_PROVIDER_SITE_OTHER): Payer: Medicare HMO | Admitting: Internal Medicine

## 2014-05-31 VITALS — BP 118/62 | HR 60 | Ht 61.0 in | Wt 121.0 lb

## 2014-05-31 DIAGNOSIS — K2100 Gastro-esophageal reflux disease with esophagitis, without bleeding: Secondary | ICD-10-CM

## 2014-05-31 DIAGNOSIS — R0789 Other chest pain: Secondary | ICD-10-CM

## 2014-05-31 DIAGNOSIS — Z23 Encounter for immunization: Secondary | ICD-10-CM

## 2014-05-31 MED ORDER — RANITIDINE HCL 150 MG PO TABS: 150.0000 mg | ORAL_TABLET | Freq: Two times a day (BID) | ORAL | Status: DC

## 2014-05-31 NOTE — Patient Instructions (Addendum)
We have sent the following medications to your pharmacy for you to pick up at your convenience: Zantac 150 mg twice daily  Please purchase the following medications over the counter and take as directed: Gaviscon-Chew 1-2 tablets by mouth every 4 hours as needed for belching and gas.  We have given you a flu shot today.  CC:Dr Gwendolyn Grant, Dr Alain Marion

## 2014-05-31 NOTE — Progress Notes (Signed)
Emily [name] May 06, 1941 [id number]  Note: This dictation was prepared with Dragon digital system. Any transcriptional errors that result from this procedure are unintentional.   History of Present Illness:  This is a 73 year old white female with a recent onset of gastroesophageal reflux which was precipitated by starting on several large vitamin pills which her daughter gave her. Patient brought the pills with her to show me  each measures  half an inch and large capsules containing various minerals, electrolytes and vitaminsAfter swallowing the pills one at a time  she developed chest pain. It has  been subsiding. She stopped taking the large pills after seeing Dr Alain Marion. She initially started on Nexium 40 mg daily but developed diarrhea and was switched to ranitidine 150 mg twice daily. She discontinued the medicine because she was getting better. She still complains of belching and discomfort substernally. We have seen her in the past for colorectal screening. Her last colonoscopy in August 2010 showed mild diverticulosis. A CT scan of the abdomen in March 2013 showed no active process. She had a prior laparoscopic cholecystectomy. She also had breast cancer in the past.    Past Medical History  Diagnosis Date  . Allergic rhinitis   . Squamous cell carcinoma     History of BC  . Heart palpitations   . Actinic keratosis   . BACK PAIN   . 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 Cervicalgia   . DIVERTICULOSIS, COLON   . ENDOMETRIOSIS   . Pleural plaque without asbestos     CT chest 10/2010: R>L lobes, upper> lower  . Cholelithiasis   . Hypertension   . HYPOTHYROIDISM   . GERD (gastroesophageal reflux disease)     Past Surgical History  Procedure Laterality Date  . Cholecystectomy  2010  . Endometrial ablation    . Breast lumpectomy  2001    left  . Tubal ligation      Allergies  Allergen Reactions  . Nexium  [Esomeprazole Magnesium]     diarrhea  . Prednisone Swelling and Rash    Family history and social history have been reviewed.  Review of Systems: Occasional dysphagia. Some odynophagia. Substernal discomfort when eating. Belching  The remainder of the 10 point ROS is negative except as outlined in the H&P  Physical Exam: General Appearance Well developed, in no distress, no hoarseness or cough Eyes  Non icteric  HEENT  Non traumatic, normocephalic  Mouth No lesion, tongue papillated, no cheilosis Neck Supple without adenopathy, thyroid not enlarged, no carotid bruits, no JVD Lungs Clear to auscultation bilaterally COR Normal S1, normal S2, regular rhythm, no murmur, quiet precordium Abdomen soft nontender Rectal not done Extremities  No pedal edema Skin No lesions Neurological Alert and oriented x 3 Psychological Normal mood and affect  Assessment and Plan:   Problem #25 73 year old white female with an acute onset of gastroesophageal reflux and most likely esophagitis precipitated by ingestion of large capsules and pills obtaining vitamins and minerals. She likely had gastritis and esophagitis which is slowly subsiding. She will continue Zantac 150 mg twice a day and add Gaviscon 1 or 2 tablets every 4 hours during the day. She will update [place] in 4 weeks. If the symptoms are not completely relieved, we will proceed with an upper endoscopy and biopsies.    Delfin Edis 05/31/2014

## 2014-06-01 ENCOUNTER — Encounter: Payer: Self-pay | Admitting: Internal Medicine

## 2014-07-28 ENCOUNTER — Other Ambulatory Visit (INDEPENDENT_AMBULATORY_CARE_PROVIDER_SITE_OTHER): Payer: Medicare HMO

## 2014-07-28 ENCOUNTER — Encounter: Payer: Self-pay | Admitting: Internal Medicine

## 2014-07-28 ENCOUNTER — Ambulatory Visit (INDEPENDENT_AMBULATORY_CARE_PROVIDER_SITE_OTHER): Payer: Medicare HMO | Admitting: Internal Medicine

## 2014-07-28 VITALS — BP 118/80 | HR 57 | Temp 98.1°F | Ht 61.0 in | Wt 118.2 lb

## 2014-07-28 DIAGNOSIS — Z23 Encounter for immunization: Secondary | ICD-10-CM

## 2014-07-28 DIAGNOSIS — R350 Frequency of micturition: Secondary | ICD-10-CM

## 2014-07-28 DIAGNOSIS — E039 Hypothyroidism, unspecified: Secondary | ICD-10-CM

## 2014-07-28 DIAGNOSIS — Z0000 Encounter for general adult medical examination without abnormal findings: Secondary | ICD-10-CM

## 2014-07-28 DIAGNOSIS — R634 Abnormal weight loss: Secondary | ICD-10-CM

## 2014-07-28 LAB — CBC WITH DIFFERENTIAL/PLATELET
Basophils Absolute: 0.1 10*3/uL (ref 0.0–0.1)
Basophils Relative: 0.7 % (ref 0.0–3.0)
EOS ABS: 0.4 10*3/uL (ref 0.0–0.7)
EOS PCT: 4.9 % (ref 0.0–5.0)
HCT: 44.8 % (ref 36.0–46.0)
HEMOGLOBIN: 14.9 g/dL (ref 12.0–15.0)
LYMPHS PCT: 34 % (ref 12.0–46.0)
Lymphs Abs: 2.5 10*3/uL (ref 0.7–4.0)
MCHC: 33.3 g/dL (ref 30.0–36.0)
MCV: 87.4 fl (ref 78.0–100.0)
MONOS PCT: 10.6 % (ref 3.0–12.0)
Monocytes Absolute: 0.8 10*3/uL (ref 0.1–1.0)
NEUTROS ABS: 3.7 10*3/uL (ref 1.4–7.7)
Neutrophils Relative %: 49.8 % (ref 43.0–77.0)
Platelets: 217 10*3/uL (ref 150.0–400.0)
RBC: 5.12 Mil/uL — AB (ref 3.87–5.11)
RDW: 12.9 % (ref 11.5–15.5)
WBC: 7.4 10*3/uL (ref 4.0–10.5)

## 2014-07-28 LAB — URINALYSIS, ROUTINE W REFLEX MICROSCOPIC
BILIRUBIN URINE: NEGATIVE
Hgb urine dipstick: NEGATIVE
KETONES UR: NEGATIVE
LEUKOCYTES UA: NEGATIVE
NITRITE: NEGATIVE
Specific Gravity, Urine: >=1.03 — AB (ref 1.000–1.030)
Total Protein, Urine: NEGATIVE
UROBILINOGEN UA: 0.2 (ref 0.0–1.0)
Urine Glucose: NEGATIVE
pH: 5 (ref 5.0–8.0)

## 2014-07-28 LAB — LIPID PANEL
CHOL/HDL RATIO: 4
Cholesterol: 203 mg/dL — ABNORMAL HIGH (ref 0–200)
HDL: 51.5 mg/dL (ref >39.00)
LDL Cholesterol: 130 mg/dL — ABNORMAL HIGH (ref 0–99)
NonHDL: 151.5
Triglycerides: 107 mg/dL (ref 0.0–149.0)
VLDL: 21.4 mg/dL (ref 0.0–40.0)

## 2014-07-28 LAB — TSH: TSH: 0.39 u[IU]/mL (ref 0.35–4.50)

## 2014-07-28 NOTE — Progress Notes (Signed)
Subjective:    Patient ID: Emily [name], female    DOB: January 22, 1941, 73 y.o.   MRN: [id number]  HPI   Here for medicare wellness  Diet: heart healthy  Physical activity: sedentary Depression/mood screen: negative Hearing: intact to whispered voice Visual acuity: grossly normal, performs annual eye exam  ADLs: capable Fall risk: none Home safety: good Cognitive evaluation: intact to orientation, naming, recall and repetition EOL planning: adv directives, full code/ I agree  I have personally reviewed and have noted 1. The patient's medical and social history 2. Their use of alcohol, tobacco or illicit drugs 3. Their current medications and supplements 4. The patient's functional ability including ADL's, fall risks, home safety risks and hearing or visual impairment. 5. Diet and physical activities 6. Evidence for depression or mood disorders  Also reviewed chronic medical issues and interval medical events  Past Medical History  Diagnosis Date  . Allergic rhinitis   . Squamous cell carcinoma     History of BC  . Heart palpitations   . Actinic keratosis   . BACK PAIN   . 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 Cervicalgia   . DIVERTICULOSIS, COLON   . ENDOMETRIOSIS   . Pleural plaque without asbestos     CT chest 10/2010: R>L lobes, upper> lower  . Cholelithiasis   . Hypertension   . HYPOTHYROIDISM   . GERD (gastroesophageal reflux disease)    Family History  Problem Relation Age of Onset  . Heart disease Father   . Emphysema Father     smoked and worked with asbestos  . Colon cancer Neg Hx    History  Substance Use Topics  . Smoking status: Never Smoker   . Smokeless tobacco: Never Used  . Alcohol Use: No     Review of Systems  Constitutional: Positive for unexpected weight change (down 8# since 04/2014, unintentional). Negative for fatigue.  Respiratory: Negative for cough, shortness of breath and  wheezing.   Cardiovascular: Negative for chest pain, palpitations and leg swelling.  Gastrointestinal: Negative for nausea, abdominal pain and diarrhea.  Genitourinary: Positive for urgency and frequency (nocturia and small vol, weak stream). Negative for dysuria, hematuria, difficulty urinating, genital sores and dyspareunia.  Neurological: Negative for dizziness, weakness, light-headedness and headaches.  Psychiatric/Behavioral: Negative for dysphoric mood. The patient is not nervous/anxious.   All other systems reviewed and are negative.      Objective:   Physical Exam  BP 118/80 mmHg  Pulse 57  Temp(Src) 98.1 F (36.7 C) (Oral)  Ht 5\' 1"  (1.549 m)  Wt 118 lb 4 oz (53.638 kg)  BMI 22.35 kg/m2  SpO2 95% Wt Readings from Last 3 Encounters:  07/28/14 118 lb 4 oz (53.638 kg)  05/31/14 121 lb (54.885 kg)  04/26/14 123 lb (55.792 kg)   Constitutional: She appears well-developed and well-nourished. No distress.  HENT: Head: Normocephalic and atraumatic. Ears: B TMs ok, no erythema or effusion; Nose: Nose normal. Mouth/Throat: Oropharynx is clear and moist. No oropharyngeal exudate.  Eyes: Conjunctivae and EOM are normal. Pupils are equal, round, and reactive to light. No scleral icterus.  Neck: Normal range of motion. Neck supple. No JVD present. No thyromegaly present.  Cardiovascular: Normal rate, regular rhythm and normal heart sounds.  No murmur heard. No BLE edema. Pulmonary/Chest: Effort normal and breath sounds normal. No respiratory distress. She has no wheezes.  Abdominal: Soft. Bowel sounds are normal. She exhibits no  distension. There is no tenderness. no masses GU/breast: defer to gyn Musculoskeletal: Normal range of motion, no joint effusions. No gross deformities Neurological: She is alert and oriented to person, place, and time. No cranial nerve deficit. Coordination, balance, strength, speech and gait are normal.  Skin: Skin is warm and dry. No rash noted. No erythema.   Psychiatric: She has a normal mood and affect. Her behavior is normal. Judgment and thought content normal.    Lab Results  Component Value Date   WBC 9.6 04/14/2014   HGB 15.1* 04/14/2014   HCT 44.6 04/14/2014   PLT 209.0 04/14/2014   GLUCOSE 82 04/14/2014   CHOL 205* 07/14/2013   TRIG 82.0 07/14/2013   HDL 52.00 07/14/2013   LDLDIRECT 141.9 07/14/2013   LDLCALC 133* 12/18/2009   ALT 16 04/14/2014   AST 19 04/14/2014   NA 138 04/14/2014   K 4.2 04/14/2014   CL 103 04/14/2014   CREATININE 0.9 04/14/2014   BUN 20 04/14/2014   CO2 29 04/14/2014   TSH 2.87 07/14/2013    Dg Chest 2 View  04/14/2014   CLINICAL DATA:  Chest pain, history of breast cancer  EXAM: CHEST  2 VIEW  COMPARISON:  Left rib radiographs dated 11/12/2011. CT chest dated 11/08/2010.  FINDINGS: Chronic interstitial markings. No focal consolidation. Left upper lobe scarring. No pleural effusion or pneumothorax.  The heart is normal in size.  Pectus deformity.  Surgical clips in the left chest wall/axilla.  Mild degenerative changes of the visualized thoracolumbar spine.  IMPRESSION: No evidence of acute cardiopulmonary disease.   Electronically Signed   By: Julian Hy M.D.   On: 04/14/2014 11:05       Assessment & Plan:   CPX/z00.00 - Today patient counseled on age appropriate routine health concerns for screening and prevention, each reviewed and up to date or declined. Immunizations reviewed and up to date or declined. Labs/ECG reviewed. Risk factors for depression reviewed and negative. Hearing function and visual acuity are intact. ADLs screened and addressed as needed. Functional ability and level of safety reviewed and appropriate. Education, counseling and referrals performed based on assessed risks today. Patient provided with a copy of personalized plan for preventive services.  Unintentional weight loss. Patient down 8 pounds since September 2015 without change in diet. Note episode of severe  substernal chest pain radiating to back. Pain is improved with Zantac but continued belching. Denies regurgitation or uncontrolled reflux. Patient has seen GI for same who suggested EGD. If continued weight loss greater than 3 pounds patient will follow-up GI for endoscopy. Patient will call sooner if other problems. Also recheck screening labs to look for potential ulceration from blood loss etc.  Urinary frequency. Reports recent UTI with GYN exam. Recheck UA and urine culture. Consider medication therapy for OAB versus sling if felt appropriate by GYN for prolapse symptoms  Hypothyroidism. Check TSH and titrate as needed. Continue Synthroid at current dose pending check  Problem List Items Addressed This Visit    None    Visit Diagnoses    Routine general medical examination at a health care facility    -  Primary    Loss of weight        Relevant Orders       TSH       CBC with Differential    Hypothyroidism, unspecified hypothyroidism type        Relevant Orders       TSH  Lipid panel    Urinary frequency        Relevant Orders       Urinalysis, Routine w reflex microscopic       Urine culture

## 2014-07-28 NOTE — Progress Notes (Signed)
Pre visit review using our clinic review tool, if applicable. No additional management support is needed unless otherwise documented below in the visit note. 

## 2014-07-28 NOTE — Patient Instructions (Addendum)
It was good to see you today.  We have reviewed your prior records including labs and tests today  Health Maintenance reviewed - Prevnar 13 (second pneumonia vaccine) updated today. All other recommended immunizations and age-appropriate screenings are up-to-date.  Test(s) ordered today. Your results will be released to [place] (or called to you) after review, usually within 72hours after test completion. If any changes need to be made, you will be notified at that same time.  Medications reviewed and updated, no changes recommended at this time.  These monitor your weight. If unintentional weight loss of 3 or more pounds, please contact Emily [name] for follow-up on planned endoscopy as discussed  Please schedule followup in 12 months for annual exam and labs, call sooner if problems.  Health Maintenance Adopting a healthy lifestyle and getting preventive care can go a long way to promote health and wellness. Talk with your health care provider about what schedule of regular examinations is right for you. This is a good chance for you to check in with your provider about disease prevention and staying healthy. In between checkups, there are plenty of things you can do on your own. Experts have done a lot of research about which lifestyle changes and preventive measures are most likely to keep you healthy. Ask your health care provider for more information. WEIGHT AND DIET  Eat a healthy diet  Be sure to include plenty of vegetables, fruits, low-fat dairy products, and lean protein.  Do not eat a lot of foods high in solid fats, added sugars, or salt.  Get regular exercise. This is one of the most important things you can do for your health.  Most adults should exercise for at least 150 minutes each week. The exercise should increase your heart rate and make you sweat (moderate-intensity exercise).  Most adults should also do strengthening exercises at least twice a week. This is in  addition to the moderate-intensity exercise.  Maintain a healthy weight  Body mass index (BMI) is a measurement that can be used to identify possible weight problems. It estimates body fat based on height and weight. Your health care provider can help determine your BMI and help you achieve or maintain a healthy weight.  For females 2 years of age and older:   A BMI below 18.5 is considered underweight.  A BMI of 18.5 to 24.9 is normal.  A BMI of 25 to 29.9 is considered overweight.  A BMI of 30 and above is considered obese.  Watch levels of cholesterol and blood lipids  You should start having your blood tested for lipids and cholesterol at 72 years of age, then have this test every 5 years.  You may need to have your cholesterol levels checked more often if:  Your lipid or cholesterol levels are high.  You are older than 72 years of age.  You are at high risk for heart disease.  CANCER SCREENING   Lung Cancer  Lung cancer screening is recommended for adults 61-36 years old who are at high risk for lung cancer because of a history of smoking.  A yearly low-dose CT scan of the lungs is recommended for people who:  Currently smoke.  Have quit within the past 15 years.  Have at least a 30-pack-year history of smoking. A pack year is smoking an average of one pack of cigarettes a day for 1 year.  Yearly screening should continue until it has been 15 years since you quit.  Yearly screening  should stop if you develop a health problem that would prevent you from having lung cancer treatment.  Breast Cancer  Practice breast self-awareness. This means understanding how your breasts normally appear and feel.  It also means doing regular breast self-exams. Let your health care provider know about any changes, no matter how small.  If you are in your 20s or 30s, you should have a clinical breast exam (CBE) by a health care provider every 1-3 years as part of a regular  health exam.  If you are 71 or older, have a CBE every year. Also consider having a breast X-ray (mammogram) every year.  If you have a family history of breast cancer, talk to your health care provider about genetic screening.  If you are at high risk for breast cancer, talk to your health care provider about having an MRI and a mammogram every year.  Breast cancer gene (BRCA) assessment is recommended for women who have family members with BRCA-related cancers. BRCA-related cancers include:  Breast.  Ovarian.  Tubal.  Peritoneal cancers.  Results of the assessment will determine the need for genetic counseling and BRCA1 and BRCA2 testing. Cervical Cancer Routine pelvic examinations to screen for cervical cancer are no longer recommended for nonpregnant women who are considered low risk for cancer of the pelvic organs (ovaries, uterus, and vagina) and who do not have symptoms. A pelvic examination may be necessary if you have symptoms including those associated with pelvic infections. Ask your health care provider if a screening pelvic exam is right for you.   The Pap test is the screening test for cervical cancer for women who are considered at risk.  If you had a hysterectomy for a problem that was not cancer or a condition that could lead to cancer, then you no longer need Pap tests.  If you are older than 65 years, and you have had normal Pap tests for the past 10 years, you no longer need to have Pap tests.  If you have had past treatment for cervical cancer or a condition that could lead to cancer, you need Pap tests and screening for cancer for at least 20 years after your treatment.  If you no longer get a Pap test, assess your risk factors if they change (such as having a new sexual partner). This can affect whether you should start being screened again.  Some women have medical problems that increase their chance of getting cervical cancer. If this is the case for you, your  health care provider may recommend more frequent screening and Pap tests.  The human papillomavirus (HPV) test is another test that may be used for cervical cancer screening. The HPV test looks for the virus that can cause cell changes in the cervix. The cells collected during the Pap test can be tested for HPV.  The HPV test can be used to screen women 28 years of age and older. Getting tested for HPV can extend the interval between normal Pap tests from three to five years.  An HPV test also should be used to screen women of any age who have unclear Pap test results.  After 71 years of age, women should have HPV testing as often as Pap tests.  Colorectal Cancer  This type of cancer can be detected and often prevented.  Routine colorectal cancer screening usually begins at 72 years of age and continues through 72 years of age.  Your health care provider may recommend screening at an earlier  age if you have risk factors for colon cancer.  Your health care provider may also recommend using home test kits to check for hidden blood in the stool.  A small camera at the end of a tube can be used to examine your colon directly (sigmoidoscopy or colonoscopy). This is done to check for the earliest forms of colorectal cancer.  Routine screening usually begins at age 30.  Direct examination of the colon should be repeated every 5-10 years through 72 years of age. However, you may need to be screened more often if early forms of precancerous polyps or small growths are found. Skin Cancer  Check your skin from head to toe regularly.  Tell your health care provider about any new moles or changes in moles, especially if there is a change in a mole's shape or color.  Also tell your health care provider if you have a mole that is larger than the size of a pencil eraser.  Always use sunscreen. Apply sunscreen liberally and repeatedly throughout the day.  Protect yourself by wearing long sleeves,  pants, a wide-brimmed hat, and sunglasses whenever you are outside. HEART DISEASE, DIABETES, AND HIGH BLOOD PRESSURE   Have your blood pressure checked at least every 1-2 years. High blood pressure causes heart disease and increases the risk of stroke.  If you are between 9 years and 67 years old, ask your health care provider if you should take aspirin to prevent strokes.  Have regular diabetes screenings. This involves taking a blood sample to check your fasting blood sugar level.  If you are at a normal weight and have a low risk for diabetes, have this test once every three years after 72 years of age.  If you are overweight and have a high risk for diabetes, consider being tested at a younger age or more often. PREVENTING INFECTION  Hepatitis B  If you have a higher risk for hepatitis B, you should be screened for this virus. You are considered at high risk for hepatitis B if:  You were born in a country where hepatitis B is common. Ask your health care provider which countries are considered high risk.  Your parents were born in a high-risk country, and you have not been immunized against hepatitis B (hepatitis B vaccine).  You have HIV or AIDS.  You use needles to inject street drugs.  You live with someone who has hepatitis B.  You have had sex with someone who has hepatitis B.  You get hemodialysis treatment.  You take certain medicines for conditions, including cancer, organ transplantation, and autoimmune conditions. Hepatitis C  Blood testing is recommended for:  Everyone born from [birth year] through 1964.  Anyone with known risk factors for hepatitis C. Sexually transmitted infections (STIs)  You should be screened for sexually transmitted infections (STIs) including gonorrhea and chlamydia if:  You are sexually active and are younger than 72 years of age.  You are older than 72 years of age and your health care provider tells you that you are at risk for this  type of infection.  Your sexual activity has changed since you were last screened and you are at an increased risk for chlamydia or gonorrhea. Ask your health care provider if you are at risk.  If you do not have HIV, but are at risk, it may be recommended that you take a prescription medicine daily to prevent HIV infection. This is called pre-exposure prophylaxis (PrEP). You are considered at risk if:  You are sexually active and do not regularly use condoms or know the HIV status of your partner(s).  You take drugs by injection.  You are sexually active with a partner who has HIV. Talk with your health care provider about whether you are at high risk of being infected with HIV. If you choose to begin PrEP, you should first be tested for HIV. You should then be tested every 3 months for as long as you are taking PrEP.  PREGNANCY   If you are premenopausal and you may become pregnant, ask your health care provider about preconception counseling.  If you may become pregnant, take 400 to 800 micrograms (mcg) of folic acid every day.  If you want to prevent pregnancy, talk to your health care provider about birth control (contraception). OSTEOPOROSIS AND MENOPAUSE   Osteoporosis is a disease in which the bones lose minerals and strength with aging. This can result in serious bone fractures. Your risk for osteoporosis can be identified using a bone density scan.  If you are 59 years of age or older, or if you are at risk for osteoporosis and fractures, ask your health care provider if you should be screened.  Ask your health care provider whether you should take a calcium or vitamin D supplement to lower your risk for osteoporosis.  Menopause may have certain physical symptoms and risks.  Hormone replacement therapy may reduce some of these symptoms and risks. Talk to your health care provider about whether hormone replacement therapy is right for you.  HOME CARE INSTRUCTIONS   Schedule  regular health, dental, and eye exams.  Stay current with your immunizations.   Do not use any tobacco products including cigarettes, chewing tobacco, or electronic cigarettes.  If you are pregnant, do not drink alcohol.  If you are breastfeeding, limit how much and how often you drink alcohol.  Limit alcohol intake to no more than 1 drink per day for nonpregnant women. One drink equals 12 ounces of beer, 5 ounces of wine, or 1 ounces of hard liquor.  Do not use street drugs.  Do not share needles.  Ask your health care provider for help if you need support or information about quitting drugs.  Tell your health care provider if you often feel depressed.  Tell your health care provider if you have ever been abused or do not feel safe at home. Document Released: 02/18/2011 Document Revised: 12/20/2013 Document Reviewed: 07/07/2013 [facility] Patient Information 2015 [place], [place]. This information is not intended to replace advice given to you by your health care provider. Make sure you discuss any questions you have with your health care provider.

## 2014-07-30 LAB — URINE CULTURE
Colony Count: NO GROWTH
ORGANISM ID, BACTERIA: NO GROWTH

## 2014-08-16 ENCOUNTER — Telehealth: Payer: Self-pay | Admitting: Internal Medicine

## 2014-08-16 NOTE — Telephone Encounter (Signed)
Patient requests a call back in regards to why her red blood cell count was high on her last labs and if she needs to do anything in regards.

## 2014-08-16 NOTE — Telephone Encounter (Signed)
LVM for pt to call back.

## 2014-08-16 NOTE — Telephone Encounter (Signed)
Spoke to pt and reassured that the High level RBC count that showed on her blood work is nothing of concern considering that all recent (previous) cell counts were normal. Pt was concerned that she may be anemic. Informed pt if anemia was a concern or problem that PCP would have indicated that in a more detailed and informative manner. Explained to pt the cycle of blood cells and also stated that more than likely if the test was done a day two later that that could even changed the result to be within the exact normal range. Pt then stated concern that the elevated number indicate cancer. Reassured pt that the red cells would not necessarily show cancer first that the white cells are typical affected but did stated that the type of cancer and the stage of cancer would affect that particular result. Reiterated that the 0.01% increase is nothing to be worried about. Asked pt if she was having any symptoms and pt stated that all was well and she was only concerned with the RBC count being high.

## 2014-08-17 ENCOUNTER — Other Ambulatory Visit: Payer: Self-pay | Admitting: Obstetrics and Gynecology

## 2014-08-17 DIAGNOSIS — N644 Mastodynia: Secondary | ICD-10-CM

## 2014-08-17 NOTE — Telephone Encounter (Signed)
Agree: 100% no concern re: RBC. This is normal variation and NOT indicative of cancer or problem.

## 2014-08-31 ENCOUNTER — Telehealth: Payer: Self-pay | Admitting: Internal Medicine

## 2014-08-31 NOTE — Telephone Encounter (Signed)
Spoke with patient and she states she is having GERD symptoms. Patient had stopped her Zantac because she felt better. She will restart this and call in a couple weeks with update.

## 2014-09-08 ENCOUNTER — Ambulatory Visit
Admission: RE | Admit: 2014-09-08 | Discharge: 2014-09-08 | Disposition: A | Discharge status: Home / Self Care | Payer: Medicare HMO | Source: Ambulatory Visit | Attending: Obstetrics and Gynecology | Admitting: Obstetrics and Gynecology

## 2014-09-08 DIAGNOSIS — N644 Mastodynia: Secondary | ICD-10-CM

## 2014-09-13 ENCOUNTER — Encounter: Payer: Self-pay | Admitting: Internal Medicine

## 2014-09-13 ENCOUNTER — Ambulatory Visit (INDEPENDENT_AMBULATORY_CARE_PROVIDER_SITE_OTHER): Payer: Medicare HMO | Admitting: Internal Medicine

## 2014-09-13 VITALS — BP 124/74 | HR 55 | Temp 97.6°F | Ht 61.0 in | Wt 120.2 lb

## 2014-09-13 DIAGNOSIS — R21 Rash and other nonspecific skin eruption: Secondary | ICD-10-CM

## 2014-09-13 MED ORDER — PREDNISONE 10 MG PO TABS
10.0000 mg | ORAL_TABLET | Freq: Every day | ORAL | Status: DC
Start: 2014-09-13 — End: 2015-01-06

## 2014-09-13 NOTE — Progress Notes (Signed)
   Subjective:    Patient ID: Emily [name], female    DOB: 04/06/1941, 74 y.o.   MRN: [id number]  HPI  She describes diffuse lesions over the abdomen which began in the last 48 hours. They are itchy & respond to topical cortisone.  They're not present over the chest  She does have a history of seasonal psoriasis mainly in the right upper quadrant. She's noted some of this this year in the left upper quadrant.  She is on ranitidine to treat GERD as a prelude to possible endoscopy.  She also has Zyrtec but has not been taking this because it makes her sedated.    Review of Systems  No associated itchy, watery eyes.  Swelling of the lips or tongue denied.  Shortness of breath, wheezing, or cough absent.  No rash or urticaria noted.  Fever ,chills , or sweats denied. Purulence absent.  Diarrhea not present.    Objective:   Physical Exam  Positive or pertinent findings include: She has scattered small 2 mm papules over the abdomen which are slightly pink. These do blanch with pressure.  General appearance :adequately nourished; in no distress.  Eyes: No conjunctival inflammation or scleral icterus is present.  Oral exam: Dental hygiene is good. Lips and gums are healthy appearing.There is no oropharyngeal erythema or exudate noted.   Heart:  Normal rate and regular rhythm. S1 and S2 normal without gallop, murmur, click, rub or other extra sounds     Lungs:Chest clear to auscultation; no wheezes, rhonchi,rales ,or rubs present.No increased work of breathing.   Abdomen: bowel sounds normal, soft and non-tender without masses, organomegaly or hernias noted.  No guarding or rebound. No flank tenderness to percussion.  Vascular : all pulses equal ; no bruits present.  Skin:Warm & dry. no jaundice or tenting  Lymphatic: No lymphadenopathy is noted about the head, neck, axilla.   Neuro: Strength, tone  normal.     Assessment & Plan:  #1 papular dermatitis, pruritic  and blanching  See orders and after visit summary

## 2014-09-13 NOTE — Patient Instructions (Addendum)
   Restrict hyperallergenic foods at this time: Nuts, strawberries, seafood , chocolate, and tomatoes. Allergra 180 mg each am until lesions resolve. Fill the  prescription for Prednisone it there is not dramatic improvement in the next 72  Hours.  Dermatology referral  for possible punch biopsy if lesions fail to resolve.

## 2014-09-13 NOTE — Progress Notes (Signed)
Pre visit review using our clinic review tool, if applicable. No additional management support is needed unless otherwise documented below in the visit note. 

## 2014-09-19 HISTORY — PX: CATARACT EXTRACTION: SUR2

## 2014-10-11 ENCOUNTER — Other Ambulatory Visit: Payer: Self-pay | Admitting: Internal Medicine

## 2014-11-04 ENCOUNTER — Other Ambulatory Visit: Payer: Self-pay | Admitting: Dermatology

## 2014-11-30 ENCOUNTER — Telehealth: Payer: Self-pay

## 2014-11-30 NOTE — Telephone Encounter (Signed)
Call to introduce AMW assessment/ patient stated she was interested and asked a question about her colonoscopy. Colonoscopy in system from 2010 and patient states she was told it would be 10 years prior to next. Patient then stated she was going to call Dr. Olevia Perches to schedule endo, so will ask about fup for colonoscopy. Also stated she has been to funeral in [place] and drove back yesterday. Now has difficulty voiding, some burning. Patient stated these type symptoms the last time was a UTI. Offered to make apt for her but she prefers to go to her GYN and will call her GYN and take ua specimen in to be checked.  States "Nurse Practitioner" from [facility] was out to assess her with the same questions. Offered apt for AWV to be scheduled further out. Will fup by phone.

## 2014-12-02 NOTE — Telephone Encounter (Signed)
Patient agreed to medicare assessment and scheduled for September prior to her annual

## 2015-01-06 ENCOUNTER — Ambulatory Visit (INDEPENDENT_AMBULATORY_CARE_PROVIDER_SITE_OTHER): Payer: Medicare HMO | Admitting: Internal Medicine

## 2015-01-06 ENCOUNTER — Other Ambulatory Visit (INDEPENDENT_AMBULATORY_CARE_PROVIDER_SITE_OTHER): Payer: Medicare HMO

## 2015-01-06 ENCOUNTER — Encounter: Payer: Self-pay | Admitting: Internal Medicine

## 2015-01-06 VITALS — BP 104/58 | HR 68 | Ht 60.0 in | Wt 124.0 lb

## 2015-01-06 DIAGNOSIS — R140 Abdominal distension (gaseous): Secondary | ICD-10-CM

## 2015-01-06 DIAGNOSIS — R143 Flatulence: Secondary | ICD-10-CM | POA: Diagnosis not present

## 2015-01-06 DIAGNOSIS — IMO0001 Reserved for inherently not codable concepts without codable children: Secondary | ICD-10-CM

## 2015-01-06 MED ORDER — SUCRALFATE 1 GM/10ML PO SUSP: 1.0000 g | Freq: Two times a day (BID) | ORAL | Status: DC

## 2015-01-06 NOTE — Patient Instructions (Addendum)
You have been scheduled for an endoscopy. Please follow written instructions given to you at your visit today. If you use inhalers (even only as needed), please bring them with you on the day of your procedure. Your physician has requested that you go to www.startemmi.com and enter the access code given to you at your visit today. This web site gives a general overview about your procedure. However, you should still follow specific instructions given to you by our office regarding your preparation for the procedure.  Go to the basement for labs today  We have sent Carafate to your pharmacy Use Phazyme two with each meal daily if needed for gas Dr Asa Lente

## 2015-01-06 NOTE — Progress Notes (Signed)
Emily [name] [birth year] [id number]  Note: This dictation was prepared with Dragon digital system. Any transcriptional errors that result from this procedure are unintentional.   History of Present Illness: This is a 74 year old white female with gastroesophageal reflux unable to tolerate  Nexium which gives her diarrhea. She switched to Zantac 150 mg twice a day but  developed a rash , seen by dermatologist, biopsies were non specific . She was asked to discontinue most of her medication to see if the rash would go away. She is now having indigestion, dyspepsia and bloating. She takes Phazyme. She was prescribed doxycycline 100 mg a day for the rash but has not started taking it because of concern for gastric intolerance. There is a history of prior cholecystectomy. Last the colonoscopy in August 2010 showed mild diverticulosis    Past Medical History  Diagnosis Date  . Allergic rhinitis   . Squamous cell carcinoma     History of BC  . Heart palpitations   . Actinic keratosis   . BACK PAIN   . 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 Cervicalgia   . DIVERTICULOSIS, COLON   . ENDOMETRIOSIS   . Pleural plaque without asbestos     CT chest 10/2010: R>L lobes, upper> lower  . Cholelithiasis   . Hypertension   . HYPOTHYROIDISM   . GERD (gastroesophageal reflux disease)     Past Surgical History  Procedure Laterality Date  . Cholecystectomy  2010  . Endometrial ablation    . Breast lumpectomy  2001    left  . Tubal ligation      Allergies  Allergen Reactions  . Nexium [Esomeprazole Magnesium]     diarrhea  . Prednisone     Facial flushing    Family history and social history have been reviewed.  Review of Systems: Negative for heartburn or dysphagia. Positive for dyspepsia bloating The remainder of the 10 point ROS is negative except as outlined in the H&P  Physical Exam: General Appearance Well developed, in no  distress Eyes  Non icteric  HEENT  Non traumatic, normocephalic  Mouth No lesion, tongue papillated, no cheilosis Neck Supple without adenopathy, thyroid not enlarged, no carotid bruits, no JVD Lungs Clear to auscultation bilaterally COR Normal S1, normal S2, regular rhythm, no murmur, quiet precordium Abdomen increased bowel sounds. Soft nontender. Liver edge at costal margin Rectal not done Extremities  No pedal edema Skin No lesions Neurological Alert and oriented x 3 Psychological Normal mood and affect  Assessment and Plan:   74 year old white female with dyspepsia and history of gastroesophageal reflux. We will start Carafate slurry 10 cc twice a day for presumed esophagitis/gastritis. She will resume align 1 qd for  presumed bacterial overgrowth. She may also take Phazyme 2 after each meal for gas/ bloating.. We will schedule upper endoscopy and  biopsies to rule out H. pylori. We will be also checking sprue profile and IgA    Delfin Edis 01/06/2015

## 2015-01-09 LAB — GLIA (IGA/G) + TTG IGA
GLIADIN IGG: 3 U (ref <20)
Gliadin IgA: 5 Units (ref <20)
Tissue Transglutaminase Ab, IgA: 1 U/mL (ref <4)

## 2015-01-09 LAB — IGA: IgA: 150 mg/dL (ref 68–378)

## 2015-01-10 ENCOUNTER — Telehealth: Payer: Self-pay | Admitting: Internal Medicine

## 2015-01-10 NOTE — Telephone Encounter (Signed)
Carafate 1 gm, #60, 1 po bid, 1 refill

## 2015-01-10 NOTE — Telephone Encounter (Signed)
Patient's insurance will not cover the sucralfate (CARAFATE) 1 gm/10 mL suspension. They will only cover this in pill form. You wanted the patient to take 10 mLs (1 g total), po, bid, Dispensed 420 mL with no refills. How does the patient take this medication in pill form? Please advise.

## 2015-01-11 MED ORDER — SUCRALFATE 1 G PO TABS: 1.0000 g | ORAL_TABLET | Freq: Two times a day (BID) | ORAL | Status: DC

## 2015-01-11 NOTE — Telephone Encounter (Signed)
Sent Rx for Carafate 1 gm, #60 with one refill to [facility] in [place], [place]. Called patient at [phone] (cell #) to let them know their Rx was ready to be picked up. Patient stated they understood.

## 2015-01-12 ENCOUNTER — Encounter: Payer: Self-pay | Admitting: Internal Medicine

## 2015-02-10 ENCOUNTER — Encounter: Payer: Medicare HMO | Admitting: Internal Medicine

## 2015-03-15 ENCOUNTER — Encounter: Payer: Medicare HMO | Admitting: Internal Medicine

## 2015-03-16 ENCOUNTER — Ambulatory Visit: Payer: Medicare HMO | Admitting: Internal Medicine

## 2015-03-29 ENCOUNTER — Ambulatory Visit (AMBULATORY_SURGERY_CENTER): Payer: Medicare HMO | Admitting: Gastroenterology

## 2015-03-29 ENCOUNTER — Encounter: Payer: Medicare HMO | Admitting: Internal Medicine

## 2015-03-29 ENCOUNTER — Encounter: Payer: Self-pay | Admitting: Gastroenterology

## 2015-03-29 VITALS — BP 140/62 | HR 60 | Temp 97.7°F | Resp 46 | Ht 60.0 in | Wt 124.0 lb

## 2015-03-29 DIAGNOSIS — R142 Eructation: Secondary | ICD-10-CM | POA: Diagnosis not present

## 2015-03-29 DIAGNOSIS — R141 Gas pain: Secondary | ICD-10-CM | POA: Diagnosis not present

## 2015-03-29 DIAGNOSIS — R143 Flatulence: Secondary | ICD-10-CM | POA: Diagnosis not present

## 2015-03-29 DIAGNOSIS — K317 Polyp of stomach and duodenum: Secondary | ICD-10-CM | POA: Diagnosis not present

## 2015-03-29 MED ORDER — SODIUM CHLORIDE 0.9 % IV SOLN: 500.0000 mL | INTRAVENOUS | Status: DC

## 2015-03-29 NOTE — Progress Notes (Signed)
Stable to RR 

## 2015-03-29 NOTE — Op Note (Signed)
[place]  [address]. [place], [id number]   ENDOSCOPY PROCEDURE REPORT  PATIENT: [name], Emily  MR#: [id number] BIRTHDATE: 04-Aug-1941 , 73  yrs. old GENDER: female ENDOSCOPIST: Inda Castle, MD REFERRED BY:  Gwendolyn Grant, M.D. PROCEDURE DATE:  03/29/2015 PROCEDURE:  EGD w/ biopsy ASA CLASS:     Class II INDICATIONS:  dyspepsia. MEDICATIONS: Monitored anesthesia care, Propofol 100 mg IV, Lidocaine 40 mg IV, and Simethicone 40mg  PO TOPICAL ANESTHETIC:  DESCRIPTION OF PROCEDURE: After the risks benefits and alternatives of the procedure were thoroughly explained, informed consent was obtained.  The [id number] endoscope was introduced through the mouth and advanced to the second portion of the duodenum , Without limitations.  The instrument was slowly withdrawn as the mucosa was fully examined.    STOMACH: Multiple smooth sessile polyps ranging between 3-67mm in size were found in the gastric fundus.  Multiple biopsies was performed using cold forceps.  Sample sent for histology. Polyps had the appearance of benign fundic gland polyps Except for the findings listed, the EGD was otherwise normal.  Retroflexed views revealed no abnormalities.     The scope was then withdrawn from the patient and the procedure completed.  COMPLICATIONS: There were no immediate complications.  ENDOSCOPIC IMPRESSION: 1.   Multiple sessile polyps ranging between 3-64mm in size were found in the gastric fundus; multiple biopsies was performed 2.   EGD was otherwise normal  RECOMMENDATIONS: Await pathology results Resume Carafate as needed on Carafate as needed  REPEAT EXAM:  eSigned:  Inda Castle, MD 03/29/2015 2:41 PM    CC:

## 2015-03-29 NOTE — Patient Instructions (Addendum)
YOU HAD AN ENDOSCOPIC PROCEDURE TODAY AT [facility]:   Refer to the procedure report that was given to you for any specific questions about what was found during the examination.  If the procedure report does not answer your questions, please call your gastroenterologist to clarify.  If you requested that your care partner not be given the details of your procedure findings, then the procedure report has been included in a sealed envelope for you to review at your convenience later.  YOU SHOULD EXPECT: Some feelings of bloating in the abdomen. Passage of more gas than usual.  Walking can help get rid of the air that was put into your GI tract during the procedure and reduce the bloating. If you had a lower endoscopy (such as a colonoscopy or flexible sigmoidoscopy) you may notice spotting of blood in your stool or on the toilet paper. If you underwent a bowel prep for your procedure, you may not have a normal bowel movement for a few days.  Please Note:  You might notice some irritation and congestion in your nose or some drainage.  This is from the oxygen used during your procedure.  There is no need for concern and it should clear up in a day or so.  SYMPTOMS TO REPORT IMMEDIATELY:     Following upper endoscopy (EGD)  Vomiting of blood or coffee ground material  New chest pain or pain under the shoulder blades  Painful or persistently difficult swallowing  New shortness of breath  Fever of 100F or higher  Black, tarry-looking stools  For urgent or emergent issues, a gastroenterologist can be reached at any hour by calling [phone].   DIET: Your first meal following the procedure should be a small meal and then it is ok to progress to your normal diet. Heavy or fried foods are harder to digest and may make you feel nauseous or bloated.  Likewise, meals heavy in dairy and vegetables can increase bloating.  Drink plenty of fluids but you should avoid alcoholic beverages  for 24 hours.  ACTIVITY:  You should plan to take it easy for the rest of today and you should NOT DRIVE or use heavy machinery until tomorrow (because of the sedation medicines used during the test).    FOLLOW UP: Our staff will call the number listed on your records the next business day following your procedure to check on you and address any questions or concerns that you may have regarding the information given to you following your procedure. If we do not reach you, we will leave a message.  However, if you are feeling well and you are not experiencing any problems, there is no need to return our call.  We will assume that you have returned to your regular daily activities without incident.  If any biopsies were taken you will be contacted by phone or by letter within the next 1-3 weeks.  Please call us at [phone] if you have not heard about the biopsies in 3 weeks.    SIGNATURES/CONFIDENTIALITY: You and/or your care partner have signed paperwork which will be entered into your electronic medical record.  These signatures attest to the fact that that the information above on your After Visit Summary has been reviewed and is understood.  Full responsibility of the confidentiality of this discharge information lies with you and/or your care-partner.   Resume Carafate as needed  Await biopsy results

## 2015-03-29 NOTE — Progress Notes (Signed)
Called to room to assist during endoscopic procedure.  Patient ID and intended procedure confirmed with present staff. Received instructions for my participation in the procedure from the performing physician.  

## 2015-03-31 ENCOUNTER — Telehealth: Payer: Self-pay

## 2015-03-31 NOTE — Telephone Encounter (Signed)
  Follow up Call-  Call back number 03/29/2015  Post procedure Call Back phone  # [phone]  Permission to leave phone message Yes     Patient questions:  Do you have a fever, pain , or abdominal swelling? No. Pain Score  0 *  Have you tolerated food without any problems? Yes.    Have you been able to return to your normal activities? Yes.    Do you have any questions about your discharge instructions: Diet   No. Medications  No. Follow up visit  No.  Do you have questions or concerns about your Care? No.  Actions: * If pain score is 4 or above: No action needed, pain <4.

## 2015-04-04 ENCOUNTER — Encounter: Payer: Self-pay | Admitting: Gastroenterology

## 2015-04-10 ENCOUNTER — Other Ambulatory Visit: Payer: Self-pay | Admitting: Internal Medicine

## 2015-04-20 ENCOUNTER — Ambulatory Visit (INDEPENDENT_AMBULATORY_CARE_PROVIDER_SITE_OTHER): Payer: Medicare HMO

## 2015-04-20 VITALS — BP 126/80 | Ht 62.0 in | Wt 121.5 lb

## 2015-04-20 DIAGNOSIS — Z0000 Encounter for general adult medical examination without abnormal findings: Secondary | ICD-10-CM

## 2015-04-20 DIAGNOSIS — Z23 Encounter for immunization: Secondary | ICD-10-CM

## 2015-04-20 NOTE — Patient Instructions (Addendum)
Emily [name] , Thank you for taking time to come for your Medicare Wellness Visit. I appreciate your ongoing commitment to your health goals. Please review the following plan we discussed and let me know if I can assist you in the future.   Will fup with Dr. Garwin Brothers regarding repeat dexa; Will start water walking;  Will take high does flu shot today  To fup with Dr. Deatra Ina regarding gas To fup by email with Dr. Asa Lente regarding urinary freq at hs   These are the goals we discussed: Goals    . Exercise 150 minutes per week (moderate activity)     Return to water walking x 2 per week       This is a list of the screening recommended for you and due dates:  Health Maintenance  Topic Date Due  . Flu Shot  03/20/2015  . Mammogram  09/08/2016  . Tetanus Vaccine  06/17/2017  . Colon Cancer Screening  04/13/2019  . DEXA scan (bone density measurement)  Completed  . Shingles Vaccine  Completed  . Pneumonia vaccines  Completed   Urinary Frequency The number of times a normal person urinates depends upon how much liquid they take in and how much liquid they are losing. If the temperature is hot and there is high humidity, then the person will sweat more and usually breathe a little more frequently. These factors decrease the amount of frequency of urination that would be considered normal. The amount you drink is easily determined, but the amount of fluid lost is sometimes more difficult to calculate.  Fluid is lost in two ways:  Sensible fluid loss is usually measured by the amount of urine that you get rid of. Losses of fluid can also occur with diarrhea.  Insensible fluid loss is more difficult to measure. It is caused by evaporation. Insensible loss of fluid occurs through breathing and sweating. It usually ranges from a little less than a quart to a little more than a quart of fluid a day. In normal temperatures and activity levels, the average person may urinate 4 to 7 times in a  24-hour period. Needing to urinate more often than that could indicate a problem. If one urinates 4 to 7 times in 24 hours and has large volumes each time, that could indicate a different problem from one who urinates 4 to 7 times a day and has small volumes. The time of urinating is also important. Most urinating should be done during the waking hours. Getting up at night to urinate frequently can indicate some problems. CAUSES  The bladder is the organ in your lower abdomen that holds urine. Like a balloon, it swells some as it fills up. Your nerves sense this and tell you it is time to head for the bathroom. There are a number of reasons that you might feel the need to urinate more often than usual. They include:  Urinary tract infection. This is usually associated with other signs such as burning when you urinate.  In men, problems with the prostate (a walnut-size gland that is located near the tube that carries urine out of your body). There are two reasons why the prostate can cause an increased frequency of urination:  An enlarged prostate that does not let the bladder empty well. If the bladder only half empties when you urinate, then it only has half the capacity to fill before you have to urinate again.  The nerves in the bladder become more hypersensitive with  an increased size of the prostate even if the bladder empties completely.  Pregnancy.  Obesity. Excess weight is more likely to cause a problem for women than for men.  Bladder stones or other bladder problems.  Caffeine.  Alcohol.  Medications. For example, drugs that help the body get rid of extra fluid (diuretics) increase urine production. Some other medicines must be taken with lots of fluids.  Muscle or nerve weakness. This might be the result of a spinal cord injury, a stroke, multiple sclerosis, or Parkinson disease.  Long-standing diabetes can decrease the sensation of the bladder. This loss of sensation makes it  harder to sense the bladder needs to be emptied. Over a period of years, the bladder is stretched out by constant overfilling. This weakens the bladder muscles so that the bladder does not empty well and has less capacity to fill with new urine.  Interstitial cystitis (also called painful bladder syndrome). This condition develops because the tissues that line the inside of the bladder are inflamed (inflammation is the body's way of reacting to injury or infection). It causes pain and frequent urination. It occurs in women more often than in men. DIAGNOSIS   To decide what might be causing your urinary frequency, your health care provider will probably:  Ask about symptoms you have noticed.  Ask about your overall health. This will include questions about any medications you are taking.  Do a physical examination.  Order some tests. These might include:  A blood test to check for diabetes or other health issues that could be contributing to the problem.  Urine testing. This could measure the flow of urine and the pressure on the bladder.  A test of your neurological system (the brain, spinal cord, and nerves). This is the system that senses the need to urinate.  A bladder test to check whether it is emptying completely when you urinate.  Cystoscopy. This test uses a thin tube with a tiny camera on it. It offers a look inside your urethra and bladder to see if there are problems.  Imaging tests. You might be given a contrast dye and then asked to urinate. X-rays are taken to see how your bladder is working. TREATMENT  It is important for you to be evaluated to determine if the amount or frequency that you have is unusual or abnormal. If it is found to be abnormal, the cause should be determined and this can usually be found out easily. Depending upon the cause, treatment could include medication, stimulation of the nerves, or surgery. There are not too many things that you can do as an  individual to change your urinary frequency. It is important that you balance the amount of fluid intake needed to compensate for your activity and the temperature. Medical problems will be diagnosed and taken care of by your physician. There is no particular bladder training such as Kegel exercises that you can do to help urinary frequency. This is an exercise that is usually recommended for people who have leaking of urine when they laugh, cough, or sneeze. HOME CARE INSTRUCTIONS   Take any medications your health care provider prescribed or suggested. Follow the directions carefully.  Practice any lifestyle changes that are recommended. These might include:  Drinking less fluid or drinking at different times of the day. If you need to urinate often during the night, for example, you may need to stop drinking fluids early in the evening.  Cutting down on caffeine or alcohol. They  both can make you need to urinate more often than normal. Caffeine is found in coffee, tea, and sodas.  Losing weight, if that is recommended.  Keep a journal or a log. You might be asked to record how much you drink and when and where you feel the need to urinate. This will also help evaluate how well the treatment provided by your physician is working. SEEK MEDICAL CARE IF:   Your need to urinate often gets worse.  You feel increased pain or irritation when you urinate.  You notice blood in your urine.  You have questions about any medications that your health care provider recommended.  You notice blood, pus, or swelling at the site of any test or treatment procedure.  You develop a fever of more than 100.36F (38.1C). SEEK IMMEDIATE MEDICAL CARE IF:  You develop a fever of more than 102.82F (38.9C). Document Released: 06/01/2009 Document Revised: 12/20/2013 Document Reviewed: 06/01/2009 [facility] Patient Information 2015 [place], [place]. This information is not intended to replace advice given to you by  your health care provider. Make sure you discuss any questions you have with your health care provider.   Bone Densitometry Bone densitometry is a special X-ray that measures your bone density and can be used to help predict your risk of bone fractures. This test is used to determine bone mineral content and density to diagnose osteoporosis. Osteoporosis is the loss of bone that may cause the bone to become weak. Osteoporosis commonly occurs in women entering menopause. However, it may be found in men and in people with other diseases. PREPARATION FOR TEST No preparation necessary. WHO SHOULD BE TESTED?  All women older than 58.  Postmenopausal women (50 to 18) with risk factors for osteoporosis.  People with a previous fracture caused by normal activities.  People with a small body frame (less than 127 poundsor a body mass index [BMI] of less than 21).  People who have a parent with a hip fracture or history of osteoporosis.  People who smoke.  People who have rheumatoid arthritis.  Anyone who engages in excessive alcohol use (more than 3 drinks most days).  Women who experience early menopause. WHEN SHOULD YOU BE RETESTED? Current guidelines suggest that you should wait at least 2 years before doing a bone density test again if your first test was normal.Recent studies indicated that women with normal bone density may be able to wait a few years before needing to repeat a bone density test. You should discuss this with your caregiver.  NORMAL FINDINGS   Normal: less than standard deviation below normal (greater than -1).  Osteopenia: 1 to 2.5 standard deviations below normal (-1 to -2.5).  Osteoporosis: greater than 2.5 standard deviations below normal (less than -2.5). Test results are reported as a "T score" and a "Z score."The T score is a number that compares your bone density with the bone density of healthy, young women.The Z score is a number that compares your bone  density with the scores of women who are the same age, gender, and race.  Ranges for normal findings may vary among different laboratories and hospitals. You should always check with your doctor after having lab work or other tests done to discuss the meaning of your test results and whether your values are considered within normal limits. MEANING OF TEST  Your caregiver will go over the test results with you and discuss the importance and meaning of your results, as well as treatment options and the  need for additional tests if necessary. OBTAINING THE TEST RESULTS It is your responsibility to obtain your test results. Ask the lab or department performing the test when and how you will get your results. Document Released: 08/27/2004 Document Revised: 10/28/2011 Document Reviewed: 09/19/2010 [facility] Patient Information 2015 [place], [place]. This information is not intended to replace advice given to you by your health care provider. Make sure you discuss any questions you have with your health care provider.  Health Maintenance Adopting a healthy lifestyle and getting preventive care can go a long way to promote health and wellness. Talk with your health care provider about what schedule of regular examinations is right for you. This is a good chance for you to check in with your provider about disease prevention and staying healthy. In between checkups, there are plenty of things you can do on your own. Experts have done a lot of research about which lifestyle changes and preventive measures are most likely to keep you healthy. Ask your health care provider for more information. WEIGHT AND DIET  Eat a healthy diet  Be sure to include plenty of vegetables, fruits, low-fat dairy products, and lean protein.  Do not eat a lot of foods high in solid fats, added sugars, or salt.  Get regular exercise. This is one of the most important things you can do for your health.  Most adults should exercise  for at least 150 minutes each week. The exercise should increase your heart rate and make you sweat (moderate-intensity exercise).  Most adults should also do strengthening exercises at least twice a week. This is in addition to the moderate-intensity exercise.  Maintain a healthy weight  Body mass index (BMI) is a measurement that can be used to identify possible weight problems. It estimates body fat based on height and weight. Your health care provider can help determine your BMI and help you achieve or maintain a healthy weight.  For females 17 years of age and older:   A BMI below 18.5 is considered underweight.  A BMI of 18.5 to 24.9 is normal.  A BMI of 25 to 29.9 is considered overweight.  A BMI of 30 and above is considered obese.  Watch levels of cholesterol and blood lipids  You should start having your blood tested for lipids and cholesterol at 73 years of age, then have this test every 5 years.  You may need to have your cholesterol levels checked more often if:  Your lipid or cholesterol levels are high.  You are older than 73 years of age.  You are at high risk for heart disease.  CANCER SCREENING   Lung Cancer  Lung cancer screening is recommended for adults 70-35 years old who are at high risk for lung cancer because of a history of smoking.  A yearly low-dose CT scan of the lungs is recommended for people who:  Currently smoke.  Have quit within the past 15 years.  Have at least a 30-pack-year history of smoking. A pack year is smoking an average of one pack of cigarettes a day for 1 year.  Yearly screening should continue until it has been 15 years since you quit.  Yearly screening should stop if you develop a health problem that would prevent you from having lung cancer treatment.  Breast Cancer  Practice breast self-awareness. This means understanding how your breasts normally appear and feel.  It also means doing regular breast self-exams.  Let your health care provider know about any  changes, no matter how small.  If you are in your 20s or 30s, you should have a clinical breast exam (CBE) by a health care provider every 1-3 years as part of a regular health exam.  If you are 62 or older, have a CBE every year. Also consider having a breast X-ray (mammogram) every year.  If you have a family history of breast cancer, talk to your health care provider about genetic screening.  If you are at high risk for breast cancer, talk to your health care provider about having an MRI and a mammogram every year.  Breast cancer gene (BRCA) assessment is recommended for women who have family members with BRCA-related cancers. BRCA-related cancers include:  Breast.  Ovarian.  Tubal.  Peritoneal cancers.  Results of the assessment will determine the need for genetic counseling and BRCA1 and BRCA2 testing. Cervical Cancer Routine pelvic examinations to screen for cervical cancer are no longer recommended for nonpregnant women who are considered low risk for cancer of the pelvic organs (ovaries, uterus, and vagina) and who do not have symptoms. A pelvic examination may be necessary if you have symptoms including those associated with pelvic infections. Ask your health care provider if a screening pelvic exam is right for you.   The Pap test is the screening test for cervical cancer for women who are considered at risk.  If you had a hysterectomy for a problem that was not cancer or a condition that could lead to cancer, then you no longer need Pap tests.  If you are older than 65 years, and you have had normal Pap tests for the past 10 years, you no longer need to have Pap tests.  If you have had past treatment for cervical cancer or a condition that could lead to cancer, you need Pap tests and screening for cancer for at least 20 years after your treatment.  If you no longer get a Pap test, assess your risk factors if they change (such as  having a new sexual partner). This can affect whether you should start being screened again.  Some women have medical problems that increase their chance of getting cervical cancer. If this is the case for you, your health care provider may recommend more frequent screening and Pap tests.  The human papillomavirus (HPV) test is another test that may be used for cervical cancer screening. The HPV test looks for the virus that can cause cell changes in the cervix. The cells collected during the Pap test can be tested for HPV.  The HPV test can be used to screen women 62 years of age and older. Getting tested for HPV can extend the interval between normal Pap tests from three to five years.  An HPV test also should be used to screen women of any age who have unclear Pap test results.  After 73 years of age, women should have HPV testing as often as Pap tests.  Colorectal Cancer  This type of cancer can be detected and often prevented.  Routine colorectal cancer screening usually begins at 73 years of age and continues through 73 years of age.  Your health care provider may recommend screening at an earlier age if you have risk factors for colon cancer.  Your health care provider may also recommend using home test kits to check for hidden blood in the stool.  A small camera at the end of a tube can be used to examine your colon directly (sigmoidoscopy or colonoscopy). This  is done to check for the earliest forms of colorectal cancer.  Routine screening usually begins at age 46.  Direct examination of the colon should be repeated every 5-10 years through 73 years of age. However, you may need to be screened more often if early forms of precancerous polyps or small growths are found. Skin Cancer  Check your skin from head to toe regularly.  Tell your health care provider about any new moles or changes in moles, especially if there is a change in a mole's shape or color.  Also tell your  health care provider if you have a mole that is larger than the size of a pencil eraser.  Always use sunscreen. Apply sunscreen liberally and repeatedly throughout the day.  Protect yourself by wearing long sleeves, pants, a wide-brimmed hat, and sunglasses whenever you are outside. HEART DISEASE, DIABETES, AND HIGH BLOOD PRESSURE   Have your blood pressure checked at least every 1-2 years. High blood pressure causes heart disease and increases the risk of stroke.  If you are between 33 years and 59 years old, ask your health care provider if you should take aspirin to prevent strokes.  Have regular diabetes screenings. This involves taking a blood sample to check your fasting blood sugar level.  If you are at a normal weight and have a low risk for diabetes, have this test once every three years after 73 years of age.  If you are overweight and have a high risk for diabetes, consider being tested at a younger age or more often. PREVENTING INFECTION  Hepatitis B  If you have a higher risk for hepatitis B, you should be screened for this virus. You are considered at high risk for hepatitis B if:  You were born in a country where hepatitis B is common. Ask your health care provider which countries are considered high risk.  Your parents were born in a high-risk country, and you have not been immunized against hepatitis B (hepatitis B vaccine).  You have HIV or AIDS.  You use needles to inject street drugs.  You live with someone who has hepatitis B.  You have had sex with someone who has hepatitis B.  You get hemodialysis treatment.  You take certain medicines for conditions, including cancer, organ transplantation, and autoimmune conditions. Hepatitis C  Blood testing is recommended for:  Everyone born from [birth year] through 1964.  Anyone with known risk factors for hepatitis C. Sexually transmitted infections (STIs)  You should be screened for sexually transmitted infections  (STIs) including gonorrhea and chlamydia if:  You are sexually active and are younger than 73 years of age.  You are older than 73 years of age and your health care provider tells you that you are at risk for this type of infection.  Your sexual activity has changed since you were last screened and you are at an increased risk for chlamydia or gonorrhea. Ask your health care provider if you are at risk.  If you do not have HIV, but are at risk, it may be recommended that you take a prescription medicine daily to prevent HIV infection. This is called pre-exposure prophylaxis (PrEP). You are considered at risk if:  You are sexually active and do not regularly use condoms or know the HIV status of your partner(s).  You take drugs by injection.  You are sexually active with a partner who has HIV. Talk with your health care provider about whether you are at high risk of being  infected with HIV. If you choose to begin PrEP, you should first be tested for HIV. You should then be tested every 3 months for as long as you are taking PrEP.  PREGNANCY   If you are premenopausal and you may become pregnant, ask your health care provider about preconception counseling.  If you may become pregnant, take 400 to 800 micrograms (mcg) of folic acid every day.  If you want to prevent pregnancy, talk to your health care provider about birth control (contraception). OSTEOPOROSIS AND MENOPAUSE   Osteoporosis is a disease in which the bones lose minerals and strength with aging. This can result in serious bone fractures. Your risk for osteoporosis can be identified using a bone density scan.  If you are 100 years of age or older, or if you are at risk for osteoporosis and fractures, ask your health care provider if you should be screened.  Ask your health care provider whether you should take a calcium or vitamin D supplement to lower your risk for osteoporosis.  Menopause may have certain physical symptoms  and risks.  Hormone replacement therapy may reduce some of these symptoms and risks. Talk to your health care provider about whether hormone replacement therapy is right for you.  HOME CARE INSTRUCTIONS   Schedule regular health, dental, and eye exams.  Stay current with your immunizations.   Do not use any tobacco products including cigarettes, chewing tobacco, or electronic cigarettes.  If you are pregnant, do not drink alcohol.  If you are breastfeeding, limit how much and how often you drink alcohol.  Limit alcohol intake to no more than 1 drink per day for nonpregnant women. One drink equals 12 ounces of beer, 5 ounces of wine, or 1 ounces of hard liquor.  Do not use street drugs.  Do not share needles.  Ask your health care provider for help if you need support or information about quitting drugs.  Tell your health care provider if you often feel depressed.  Tell your health care provider if you have ever been abused or do not feel safe at home. Document Released: 02/18/2011 Document Revised: 12/20/2013 Document Reviewed: 07/07/2013 [facility] Patient Information 2015 [place], [place]. This information is not intended to replace advice given to you by your health care provider. Make sure you discuss any questions you have with your health care provider.

## 2015-04-20 NOTE — Progress Notes (Signed)
Subjective:   Emily [name] is a 74 y.o. female who presents for an Initial Medicare Annual Wellness Visit.  Review of Systems/ physical exam not completed at [facility] HRA assessment completed during visit;  The Patient was informed that this wellness visit is to identify risk and educate on how to reduce risk for increase disease through lifestyle changes.     Hx elevated HGB 04/2014 was a minor elevation and resolved at next hgb check on 07/28/2014/ the patient asked about this at the exam and stated that was back to normal last check in Dec; will fup this year w annual exam in Dec.   Hx of Malignant neoplasm noted 09/2007; History of left breast cancer status post lumpectomy 2001 and did remove some lymph nodes. No suspicious abnormalities identified bilaterally on 09/08/2014 mammogram/ No issues currently but will continue to have a mammogram every year.   Problem list review for risk  Lipids:  12/15 HDL 51; Trig 107 BMI: normal  Diet; eat small portions but eats without restriction; eat 3 meal a day; cook at home;  Exercise; We just had floors put in and had to take furniture out/ lots of work Pick up grand children from school in the afternoon and stays very busy;  May check pedometer at some point to check how many steps she does in one day Did do Water walking but stopped when home schooling grand-son last year; exercise hurts lower back;   Goal: will start water walking again which she tolerated well  Does have multi level home but lives downstairs; Sewing room upstairs  Family Hx Father had heart issues and emphysema Social history: Lives with spouse Safety reviewed  Generalized Safety in the home reviewed/ removing clutter and tripping hazards, railings on stairs and grab bars in the bathroom; good lighting; annual eye exam; review of medications  Falls assessed; mobility changes; balance changes; up at hs to the bathroom; medications;  Remodeled the bathroom with walk in  shower and grab bars as needed;  Started when spouse has a hip replacement;  Educated on prevention falls; Exercise, toning and strengthening; Agreed to use stretch bands her spouse had following his ortho surgery   Screenings overdue Ophthalmology exam; had cataract surgery in Feb; retinal Specialist  Sept 14 for "wrinkle in eye" ; Dr. Baird Cancer  Dr. Bing Plume for annual eye visits/ neg for glaucoma Immunizations; Flu shot due and agreed to take today Shingles 10/2011 Colonoscopy; due 03/2019 EKG 03/2014 Dexa scan; 08/2012 (t score -2.3 spine; -1.4 femur) had completed at gyn; Discussed she was osteopenic and stated she probably needed to have another; the patient will fup with Dr. Marvene Staff, MD to repeat and consider treatment;  Mammogram Jan 2016/ due Jan 2017 Hearing: 2000hz  both ears/ may need screening exam in the future Dental: yes; cleaning q 6 months;   Current Care Team reviewed and updated Dr Garwin Brothers Dr Elbert Ewings   Cardiac Risk Factors include: advanced age (>95men, >49 women)     Objective:    Today's Vitals   04/20/15 1013  BP: 126/80  Height: 5\' 2"  (1.575 m)  Weight: 121 lb 8 oz (55.112 kg)    Current Medications (verified) Outpatient Encounter Prescriptions as of 04/20/2015  Medication Sig  . atenolol (TENORMIN) 25 MG tablet Take 1 tablet (25 mg total) by mouth daily.  . Calcium-Vitamin D-Vitamin K (CALCIUM + D + K) 750-500-40 MG-UNT-MCG TABS Take 1 tablet by mouth 2 (two) times daily.    Marland Kitchen  levothyroxine (SYNTHROID, LEVOTHROID) 75 MCG tablet TAKE 1 TABLET BY MOUTH DAILY  . sucralfate (CARAFATE) 1 G tablet Take 1 tablet (1 g total) by mouth 2 (two) times daily. (Patient not taking: Reported on 03/29/2015)   No facility-administered encounter medications on file as of 04/20/2015.    Allergies (verified) Nexium and Prednisone   History: Past Medical History  Diagnosis Date  . Allergic rhinitis   . Squamous cell carcinoma     History of BC  . Heart  palpitations   . Actinic keratosis   . BACK PAIN   . 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 Cervicalgia   . DIVERTICULOSIS, COLON   . ENDOMETRIOSIS   . Pleural plaque without asbestos     CT chest 10/2010: R>L lobes, upper> lower  . Cholelithiasis   . Hypertension   . HYPOTHYROIDISM   . GERD (gastroesophageal reflux disease)    Past Surgical History  Procedure Laterality Date  . Cholecystectomy  2010  . Endometrial ablation    . Breast lumpectomy  2001    left  . Tubal ligation    . Cataract extraction Bilateral 09/2014    both eyes   Family History  Problem Relation Age of Onset  . Heart disease Father   . Emphysema Father     smoked and worked with asbestos  . Colon cancer Neg Hx    Social History   Occupational History  . Retired     Chiropractor   Social History Main Topics  . Smoking status: Never Smoker   . Smokeless tobacco: Never Used  . Alcohol Use: No  . Drug Use: No  . Sexual Activity: Not on file    Tobacco Counseling Counseling given: Not Answered   Activities of Daily Living In your present state of health, do you have any difficulty performing the following activities: 04/20/2015  Hearing? (No Data)  Vision? N  Difficulty concentrating or making decisions? (No Data)  Walking or climbing stairs? N  Dressing or bathing? N  Doing errands, shopping? N  Preparing Food and eating ? N  Using the Toilet? N  In the past six months, have you accidently leaked urine? Y  Do you have problems with loss of bowel control? N  Managing your Medications? N  Managing your Finances? N  Housekeeping or managing your Housekeeping? N    Immunizations and Health Maintenance Immunization History  Administered Date(s) Administered  . Influenza Split 05/20/2011, 06/10/2012  . Influenza Whole 06/18/2007  . Influenza, High Dose Seasonal PF 06/07/2013  . Influenza,inj,Quad PF,36+ Mos 05/31/2014  .  Pneumococcal Conjugate-13 07/28/2014  . Pneumococcal Polysaccharide-23 06/18/2007  . Td 06/18/2007  . Zoster 11/12/2011   Health Maintenance Due  Topic Date Due  . INFLUENZA VACCINE  03/20/2015    Patient Care Team: Rowe Clack, MD as PCP - General Lafayette Dragon, MD as Consulting Physician (Gastroenterology) Servando Salina, MD as Consulting Physician (Obstetrics and Gynecology) Jackolyn Confer, MD as Consulting Physician (General Surgery) Rowe Clack, MD as Referring Physician (Internal Medicine) Tanda Rockers, MD as Consulting Physician (Pulmonary Disease) Ladell Pier, MD as Consulting Physician (Internal Medicine) Marchia Bond, MD (Orthopedic Surgery) Calvert Cantor, MD (Ophthalmology)  Indicate any recent Medical Services you may have received from other than [facility] providers in the past year (date may be approximate).     Assessment:   This is a routine wellness examination for Emily [name].  The goal of the wellness visit is to assist the patient how to close the gaps in care and create a preventative care plan for the patient.  The patient agreed to start water walking routine, as well as use stretch bands for resistance training.  Insomnia; up to the bathroom x 4 at hs; has been awhile; has gradually increased over the last year; feel urgency; denies s/s of infection/ Agreed to outreach Dr. Asa Lente to follow up;   In October had problem with esophagus; endoscopy a couple of weeks ago; no reflux; still has gas/Dr. Deatra Ina did endo; polyps were neg; Still c/o of  painful gas; describes pain in RUQ and then pain will move to LLQ  intermittently; stopped probiotics; Advised to fup with Dr. Deatra Ina and the patient agreed to do so.  Assessment included:  Review of osteopenia; will fup with gyn for repeat bone density;  Taking meds without issues; no barriers identified Labs were and fup visit noted with MD if labs are due to be re-drawn. Would like HGB  rechecked  No Risk for hepatitis identified;   Cognition assessed by AD8; Score 0 Did educate on memory issues; The patient feels her memory is not as good; forgets words but has no issues financially and can still sew without  failures; AD8 score 1 citing problems with daily thinking. (score of 2 would indicate need for MMSE)    Suggested use of tools as luminosity; but she states she already plays a "mind" game involving memory online which she enjoys. No problems in judgement or making decisions. Not noticed by spouse or others.     Depression screen negative;   Functional status reviewed; no losses in function x 1 year   End of life planning was discussed; completed and will bring copy in.   Hearing/Vision screen  Hearing Screening   125Hz  250Hz  500Hz  1000Hz  2000Hz  4000Hz  8000Hz   Right ear:     100    Left ear:     100      Dietary issues and exercise activities discussed: Current Exercise Habits:: Home exercise routine (very busy at home and not sedentary), Intensity: Mild  Goals    . Exercise 150 minutes per week (moderate activity)     Return to water walking x 2 per week      Depression Screen PHQ 2/9 Scores 04/20/2015 07/14/2013 06/10/2012  PHQ - 2 Score 0 0 0    Fall Risk Fall Risk  04/20/2015 07/14/2013  Falls in the past year? No No    Cognitive Function: MMSE - Mini Mental State Exam 04/20/2015  Not completed: (No Data)  See note regarding memory Ad8 score 1  Screening Tests Health Maintenance  Topic Date Due  . INFLUENZA VACCINE  03/20/2015  . MAMMOGRAM  09/08/2016  . TETANUS/TDAP  06/17/2017  . COLONOSCOPY  04/13/2019  . DEXA SCAN  Completed  . ZOSTAVAX  Completed  . PNA vac Low Risk Adult  Completed      Plan:    High does flu shot given today Will fup with Dr. Garwin Brothers for dexa scan repeat and will have her send results to this office;  Will also fup with Dr. Deatra Ina regarding gas that she still feels is a problem. Has not had an apt following  her endo approx 3 weeks ago.   Will start water walking and start using stretch bands for resistance to assist with osteopenia;   During the course of the visit, Emily [name] was educated and counseled  about the following appropriate screening and preventive services:   Vaccines to include Pneumoccal, Influenza, Hepatitis B, Td, Zostavax, HCV/   High does flu shot today  Electrocardiogram 03/2014  Cardiovascular disease screening/ no issues  Colorectal cancer screening/ due August 2020 if needed   Bone density screening; to fup with GYN for repeat as her last was done at their office; will call in results to Dr. Asa Lente  Diabetes screening n/a  Glaucoma screening/ Had cataract surgery in Feb;   Mammography in Jan 2016  Defer PAP to gyn  Nutrition counseling/ adequate  Smoking cessation counseling / reviewed  Patient Instructions (the written plan) were given to the patient.    Wynetta Fines, RN   04/20/2015

## 2015-04-21 ENCOUNTER — Encounter: Payer: Self-pay | Admitting: Internal Medicine

## 2015-05-10 ENCOUNTER — Ambulatory Visit (INDEPENDENT_AMBULATORY_CARE_PROVIDER_SITE_OTHER): Payer: Medicare HMO | Admitting: Internal Medicine

## 2015-05-10 ENCOUNTER — Other Ambulatory Visit (INDEPENDENT_AMBULATORY_CARE_PROVIDER_SITE_OTHER): Payer: Medicare HMO

## 2015-05-10 ENCOUNTER — Encounter: Payer: Self-pay | Admitting: Internal Medicine

## 2015-05-10 VITALS — BP 106/78 | HR 56 | Temp 97.9°F | Resp 14 | Ht 61.0 in | Wt 121.8 lb

## 2015-05-10 DIAGNOSIS — R0982 Postnasal drip: Secondary | ICD-10-CM | POA: Insufficient documentation

## 2015-05-10 DIAGNOSIS — C50919 Malignant neoplasm of unspecified site of unspecified female breast: Secondary | ICD-10-CM

## 2015-05-10 DIAGNOSIS — Z79899 Other long term (current) drug therapy: Secondary | ICD-10-CM

## 2015-05-10 DIAGNOSIS — R5383 Other fatigue: Secondary | ICD-10-CM

## 2015-05-10 DIAGNOSIS — E039 Hypothyroidism, unspecified: Secondary | ICD-10-CM

## 2015-05-10 LAB — VITAMIN D 25 HYDROXY (VIT D DEFICIENCY, FRACTURES): VITD: 39 ng/mL (ref 30.00–100.00)

## 2015-05-10 LAB — FERRITIN: Ferritin: 120.6 ng/mL (ref 10.0–291.0)

## 2015-05-10 LAB — VITAMIN B12: Vitamin B-12: 576 pg/mL (ref 211–911)

## 2015-05-10 LAB — T4, FREE: FREE T4: 1.12 ng/dL (ref 0.60–1.60)

## 2015-05-10 LAB — FOLATE

## 2015-05-10 LAB — TSH: TSH: 0.4 u[IU]/mL (ref 0.35–4.50)

## 2015-05-10 MED ORDER — FLUTICASONE PROPIONATE 50 MCG/ACT NA SUSP: 2.0000 | Freq: Every day | NASAL | Status: DC

## 2015-05-10 NOTE — Assessment & Plan Note (Signed)
Checking TSH and free T4 today and adjust dosing as needed.

## 2015-05-10 NOTE — Progress Notes (Signed)
   Subjective:    Patient ID: Emily [name], female    DOB: Sep 06, 1940, 74 y.o.   MRN: [id number]  HPI The patient is a 74 YO female who is coming in for follow up of her thyroid. She has been on thyroid medicine for several years without dosage change. She is feeling more tired lately and has been having some hair thinning. Denies heart palpitations, tremors, diarrhea. She denies constipation.  Also having new post-nasal drip in the last 1-2 years. She has never sought care for it. Tried claritin which did not help. Allegra sometimes helps. Bothersome to her most of the day. Causing some decreased food sensation.   Review of Systems  Constitutional: Positive for fatigue. Negative for fever, activity change, appetite change and unexpected weight change.  HENT: Positive for postnasal drip and rhinorrhea. Negative for congestion, dental problem, ear discharge, ear pain, mouth sores, nosebleeds, sinus pressure, sore throat and trouble swallowing.   Eyes: Negative.   Respiratory: Negative for cough, chest tightness, shortness of breath and wheezing.   Cardiovascular: Negative for chest pain, palpitations and leg swelling.  Gastrointestinal: Negative for nausea, abdominal pain, diarrhea, constipation and abdominal distention.  Musculoskeletal: Negative.   Skin: Negative.   Neurological: Negative.       Objective:   Physical Exam  Constitutional: She is oriented to person, place, and time. She appears well-developed and well-nourished.  HENT:  Head: Normocephalic and atraumatic.  Eyes: EOM are normal.  Neck: Normal range of motion. No thyromegaly present.  Cardiovascular: Normal rate and regular rhythm.   Pulmonary/Chest: Effort normal and breath sounds normal. No respiratory distress. She has no wheezes. She has no rales.  Abdominal: Soft. Bowel sounds are normal. She exhibits no distension. There is no tenderness. There is no rebound.  Musculoskeletal: She exhibits no edema.    Neurological: She is alert and oriented to person, place, and time. Coordination normal.  Skin: Skin is warm and dry.  Psychiatric: She has a normal mood and affect.   Filed Vitals:   05/10/15 1021  BP: 106/78  Pulse: 56  Temp: 97.9 F (36.6 C)  TempSrc: Oral  Resp: 14  Height: 5\' 1"  (1.549 m)  Weight: 121 lb 12.8 oz (55.248 kg)  SpO2: 92%      Assessment & Plan:

## 2015-05-10 NOTE — Assessment & Plan Note (Signed)
Checking B12, folate, ferritin, thyroid studies, vitamin D and treat as appropriately.

## 2015-05-10 NOTE — Patient Instructions (Signed)
We will check the labs today and adjust the medicine as needed. We are checking the thyroid and some of the vitamin levels.   We have given you information about aspirin for prevention. If you decide to start taking it look for an 81 mg aspirin with EC (enteric or stomach coating).   We have sent in the nose spray for you for the dripping that you can try called flonase (fluticasone). Use 2 sprays in each nostril once a day and it may take 3-5 days for it to take full effect. If it is useful it is safe to take year round. You can stop at anytime and you should not have any side effects from stopping.

## 2015-05-10 NOTE — Assessment & Plan Note (Signed)
Rx for flonase to help with her post-nasal drip. If no improvement she will call us back.

## 2015-05-10 NOTE — Progress Notes (Signed)
Pre visit review using our clinic review tool, if applicable. No additional management support is needed unless otherwise documented below in the visit note. 

## 2015-06-12 ENCOUNTER — Other Ambulatory Visit: Payer: Self-pay | Admitting: Obstetrics and Gynecology

## 2015-06-12 DIAGNOSIS — Z853 Personal history of malignant neoplasm of breast: Secondary | ICD-10-CM

## 2015-06-12 DIAGNOSIS — M8580 Other specified disorders of bone density and structure, unspecified site: Secondary | ICD-10-CM

## 2015-07-17 ENCOUNTER — Ambulatory Visit
Admission: RE | Admit: 2015-07-17 | Discharge: 2015-07-17 | Disposition: A | Discharge status: Home / Self Care | Payer: Medicare HMO | Source: Ambulatory Visit | Attending: Obstetrics and Gynecology | Admitting: Obstetrics and Gynecology

## 2015-07-17 DIAGNOSIS — M8580 Other specified disorders of bone density and structure, unspecified site: Secondary | ICD-10-CM

## 2015-07-17 DIAGNOSIS — Z853 Personal history of malignant neoplasm of breast: Secondary | ICD-10-CM

## 2015-07-18 ENCOUNTER — Telehealth: Payer: Self-pay | Admitting: *Deleted

## 2015-07-18 MED ORDER — LEVOTHYROXINE SODIUM 75 MCG PO TABS: 75.0000 ug | ORAL_TABLET | Freq: Every day | ORAL | Status: DC

## 2015-07-18 NOTE — Telephone Encounter (Signed)
Received call pt is needing refill on her Levothyroxine. Verified pharmacy inform will send to [facility]...Johny Chess

## 2015-07-31 ENCOUNTER — Encounter: Payer: Medicare HMO | Admitting: Internal Medicine

## 2015-08-15 ENCOUNTER — Other Ambulatory Visit: Payer: Self-pay

## 2015-08-15 MED ORDER — ATENOLOL 25 MG PO TABS: 25.0000 mg | ORAL_TABLET | Freq: Every day | ORAL | Status: DC

## 2015-08-15 NOTE — Telephone Encounter (Signed)
Atenolol rx (30 day supply) sent to pharm---patient needs to make office visit to get established with new PCP

## 2015-08-23 ENCOUNTER — Encounter: Payer: Medicare HMO | Admitting: Internal Medicine

## 2015-09-05 ENCOUNTER — Other Ambulatory Visit: Payer: Self-pay

## 2015-09-05 DIAGNOSIS — Z853 Personal history of malignant neoplasm of breast: Secondary | ICD-10-CM

## 2015-09-05 DIAGNOSIS — Z1231 Encounter for screening mammogram for malignant neoplasm of breast: Secondary | ICD-10-CM

## 2015-09-08 ENCOUNTER — Other Ambulatory Visit (INDEPENDENT_AMBULATORY_CARE_PROVIDER_SITE_OTHER): Payer: Medicare HMO

## 2015-09-08 ENCOUNTER — Encounter: Payer: Self-pay | Admitting: Internal Medicine

## 2015-09-08 ENCOUNTER — Ambulatory Visit (INDEPENDENT_AMBULATORY_CARE_PROVIDER_SITE_OTHER): Payer: Medicare HMO | Admitting: Internal Medicine

## 2015-09-08 VITALS — BP 102/74 | HR 56 | Temp 97.7°F | Resp 16 | Ht 60.5 in | Wt 123.0 lb

## 2015-09-08 DIAGNOSIS — Z79899 Other long term (current) drug therapy: Secondary | ICD-10-CM

## 2015-09-08 DIAGNOSIS — R0982 Postnasal drip: Secondary | ICD-10-CM

## 2015-09-08 DIAGNOSIS — R002 Palpitations: Secondary | ICD-10-CM | POA: Diagnosis not present

## 2015-09-08 DIAGNOSIS — Z0000 Encounter for general adult medical examination without abnormal findings: Secondary | ICD-10-CM | POA: Diagnosis not present

## 2015-09-08 DIAGNOSIS — R351 Nocturia: Secondary | ICD-10-CM | POA: Diagnosis not present

## 2015-09-08 DIAGNOSIS — M810 Age-related osteoporosis without current pathological fracture: Secondary | ICD-10-CM | POA: Insufficient documentation

## 2015-09-08 DIAGNOSIS — E785 Hyperlipidemia, unspecified: Secondary | ICD-10-CM | POA: Diagnosis not present

## 2015-09-08 DIAGNOSIS — M8580 Other specified disorders of bone density and structure, unspecified site: Secondary | ICD-10-CM | POA: Diagnosis not present

## 2015-09-08 DIAGNOSIS — E038 Other specified hypothyroidism: Secondary | ICD-10-CM

## 2015-09-08 DIAGNOSIS — E039 Hypothyroidism, unspecified: Secondary | ICD-10-CM

## 2015-09-08 LAB — COMPREHENSIVE METABOLIC PANEL
ALK PHOS: 69 U/L (ref 39–117)
ALT: 15 U/L (ref 0–35)
AST: 19 U/L (ref 0–37)
Albumin: 4.4 g/dL (ref 3.5–5.2)
BUN: 20 mg/dL (ref 6–23)
CO2: 29 meq/L (ref 19–32)
Calcium: 10.1 mg/dL (ref 8.4–10.5)
Chloride: 103 mEq/L (ref 96–112)
Creatinine, Ser: 1.02 mg/dL (ref 0.40–1.20)
GFR: 56.42 mL/min — ABNORMAL LOW (ref >60.00)
GLUCOSE: 97 mg/dL (ref 70–99)
POTASSIUM: 4 meq/L (ref 3.5–5.1)
SODIUM: 140 meq/L (ref 135–145)
TOTAL PROTEIN: 7.6 g/dL (ref 6.0–8.3)
Total Bilirubin: 0.6 mg/dL (ref 0.2–1.2)

## 2015-09-08 LAB — CBC WITH DIFFERENTIAL/PLATELET
BASOS ABS: 0.1 10*3/uL (ref 0.0–0.1)
Basophils Relative: 0.7 % (ref 0.0–3.0)
EOS PCT: 3 % (ref 0.0–5.0)
Eosinophils Absolute: 0.3 10*3/uL (ref 0.0–0.7)
HCT: 44.5 % (ref 36.0–46.0)
Hemoglobin: 14.8 g/dL (ref 12.0–15.0)
LYMPHS ABS: 2.7 10*3/uL (ref 0.7–4.0)
Lymphocytes Relative: 28.7 % (ref 12.0–46.0)
MCHC: 33.2 g/dL (ref 30.0–36.0)
MCV: 88.5 fl (ref 78.0–100.0)
MONO ABS: 0.9 10*3/uL (ref 0.1–1.0)
Monocytes Relative: 9.3 % (ref 3.0–12.0)
NEUTROS PCT: 58.3 % (ref 43.0–77.0)
Neutro Abs: 5.5 10*3/uL (ref 1.4–7.7)
Platelets: 220 10*3/uL (ref 150.0–400.0)
RBC: 5.03 Mil/uL (ref 3.87–5.11)
RDW: 12.9 % (ref 11.5–15.5)
WBC: 9.4 10*3/uL (ref 4.0–10.5)

## 2015-09-08 LAB — LIPID PANEL
CHOL/HDL RATIO: 4
Cholesterol: 217 mg/dL — ABNORMAL HIGH (ref 0–200)
HDL: 54.4 mg/dL (ref >39.00)
LDL CALC: 139 mg/dL — AB (ref 0–99)
NONHDL: 162.7
Triglycerides: 121 mg/dL (ref 0.0–149.0)
VLDL: 24.2 mg/dL (ref 0.0–40.0)

## 2015-09-08 LAB — VITAMIN B12: Vitamin B-12: 795 pg/mL (ref 211–911)

## 2015-09-08 LAB — TSH: TSH: 0.58 u[IU]/mL (ref 0.35–4.50)

## 2015-09-08 NOTE — Assessment & Plan Note (Signed)
Takes atenolol at night-takes 25 mg 1 nightly 12.5 mg the next night. The medication does cause low blood pressure and lightheadedness. She is trying to reduce the medication dose slowly to see if she tolerates it Advised her she can take half of the tablet if needed for palpitations

## 2015-09-08 NOTE — Assessment & Plan Note (Signed)
Chronic without obvious allergy symptoms, but she may have some year-round allergies Flonase did not help much Oral antihistamines didn't seem to help Recommended starting Claritin daily

## 2015-09-08 NOTE — Progress Notes (Signed)
Pre visit review using our clinic review tool, if applicable. No additional management support is needed unless otherwise documented below in the visit note. 

## 2015-09-08 NOTE — Patient Instructions (Addendum)
We have reviewed your prior records including labs and tests today.  Test(s) ordered today. Your results will be released to [place] (or called to you) after review, usually within 72hours after test completion. If any changes need to be made, you will be notified at that same time.  All other Health Maintenance issues reviewed.   All recommended immunizations and age-appropriate screenings are up-to-date.  No immunizations administered today.   Medications reviewed and updated.  No changes recommended at this time.   Health Maintenance, Female Adopting a healthy lifestyle and getting preventive care can go a long way to promote health and wellness. Talk with your health care provider about what schedule of regular examinations is right for you. This is a good chance for you to check in with your provider about disease prevention and staying healthy. In between checkups, there are plenty of things you can do on your own. Experts have done a lot of research about which lifestyle changes and preventive measures are most likely to keep you healthy. Ask your health care provider for more information. WEIGHT AND DIET  Eat a healthy diet  Be sure to include plenty of vegetables, fruits, low-fat dairy products, and lean protein.  Do not eat a lot of foods high in solid fats, added sugars, or salt.  Get regular exercise. This is one of the most important things you can do for your health.  Most adults should exercise for at least 150 minutes each week. The exercise should increase your heart rate and make you sweat (moderate-intensity exercise).  Most adults should also do strengthening exercises at least twice a week. This is in addition to the moderate-intensity exercise.  Maintain a healthy weight  Body mass index (BMI) is a measurement that can be used to identify possible weight problems. It estimates body fat based on height and weight. Your health care provider can help determine your  BMI and help you achieve or maintain a healthy weight.  For females 54 years of age and older:   A BMI below 18.5 is considered underweight.  A BMI of 18.5 to 24.9 is normal.  A BMI of 25 to 29.9 is considered overweight.  A BMI of 30 and above is considered obese.  Watch levels of cholesterol and blood lipids  You should start having your blood tested for lipids and cholesterol at 74 years of age, then have this test every 5 years.  You may need to have your cholesterol levels checked more often if:  Your lipid or cholesterol levels are high.  You are older than 73 years of age.  You are at high risk for heart disease.  CANCER SCREENING   Lung Cancer  Lung cancer screening is recommended for adults 79-64 years old who are at high risk for lung cancer because of a history of smoking.  A yearly low-dose CT scan of the lungs is recommended for people who:  Currently smoke.  Have quit within the past 15 years.  Have at least a 30-pack-year history of smoking. A pack year is smoking an average of one pack of cigarettes a day for 1 year.  Yearly screening should continue until it has been 15 years since you quit.  Yearly screening should stop if you develop a health problem that would prevent you from having lung cancer treatment.  Breast Cancer  Practice breast self-awareness. This means understanding how your breasts normally appear and feel.  It also means doing regular breast self-exams. Let your  health care provider know about any changes, no matter how small.  If you are in your 20s or 30s, you should have a clinical breast exam (CBE) by a health care provider every 1-3 years as part of a regular health exam.  If you are 60 or older, have a CBE every year. Also consider having a breast X-ray (mammogram) every year.  If you have a family history of breast cancer, talk to your health care provider about genetic screening.  If you are at high risk for breast  cancer, talk to your health care provider about having an MRI and a mammogram every year.  Breast cancer gene (BRCA) assessment is recommended for women who have family members with BRCA-related cancers. BRCA-related cancers include:  Breast.  Ovarian.  Tubal.  Peritoneal cancers.  Results of the assessment will determine the need for genetic counseling and BRCA1 and BRCA2 testing. Cervical Cancer Your health care provider may recommend that you be screened regularly for cancer of the pelvic organs (ovaries, uterus, and vagina). This screening involves a pelvic examination, including checking for microscopic changes to the surface of your cervix (Pap test). You may be encouraged to have this screening done every 3 years, beginning at age 80.  For women ages 59-65, health care providers may recommend pelvic exams and Pap testing every 3 years, or they may recommend the Pap and pelvic exam, combined with testing for human papilloma virus (HPV), every 5 years. Some types of HPV increase your risk of cervical cancer. Testing for HPV may also be done on women of any age with unclear Pap test results.  Other health care providers may not recommend any screening for nonpregnant women who are considered low risk for pelvic cancer and who do not have symptoms. Ask your health care provider if a screening pelvic exam is right for you.  If you have had past treatment for cervical cancer or a condition that could lead to cancer, you need Pap tests and screening for cancer for at least 20 years after your treatment. If Pap tests have been discontinued, your risk factors (such as having a new sexual partner) need to be reassessed to determine if screening should resume. Some women have medical problems that increase the chance of getting cervical cancer. In these cases, your health care provider may recommend more frequent screening and Pap tests. Colorectal Cancer  This type of cancer can be detected and  often prevented.  Routine colorectal cancer screening usually begins at 74 years of age and continues through 74 years of age.  Your health care provider may recommend screening at an earlier age if you have risk factors for colon cancer.  Your health care provider may also recommend using home test kits to check for hidden blood in the stool.  A small camera at the end of a tube can be used to examine your colon directly (sigmoidoscopy or colonoscopy). This is done to check for the earliest forms of colorectal cancer.  Routine screening usually begins at age 61.  Direct examination of the colon should be repeated every 5-10 years through 74 years of age. However, you may need to be screened more often if early forms of precancerous polyps or small growths are found. Skin Cancer  Check your skin from head to toe regularly.  Tell your health care provider about any new moles or changes in moles, especially if there is a change in a mole's shape or color.  Also tell your health  care provider if you have a mole that is larger than the size of a pencil eraser.  Always use sunscreen. Apply sunscreen liberally and repeatedly throughout the day.  Protect yourself by wearing long sleeves, pants, a wide-brimmed hat, and sunglasses whenever you are outside. HEART DISEASE, DIABETES, AND HIGH BLOOD PRESSURE   High blood pressure causes heart disease and increases the risk of stroke. High blood pressure is more likely to develop in:  People who have blood pressure in the high end of the normal range (130-139/85-89 mm Hg).  People who are overweight or obese.  People who are African American.  If you are 109-50 years of age, have your blood pressure checked every 3-5 years. If you are 22 years of age or older, have your blood pressure checked every year. You should have your blood pressure measured twice--once when you are at a hospital or clinic, and once when you are not at a hospital or clinic.  Record the average of the two measurements. To check your blood pressure when you are not at a hospital or clinic, you can use:  An automated blood pressure machine at a pharmacy.  A home blood pressure monitor.  If you are between 75 years and 79 years old, ask your health care provider if you should take aspirin to prevent strokes.  Have regular diabetes screenings. This involves taking a blood sample to check your fasting blood sugar level.  If you are at a normal weight and have a low risk for diabetes, have this test once every three years after 74 years of age.  If you are overweight and have a high risk for diabetes, consider being tested at a younger age or more often. PREVENTING INFECTION  Hepatitis B  If you have a higher risk for hepatitis B, you should be screened for this virus. You are considered at high risk for hepatitis B if:  You were born in a country where hepatitis B is common. Ask your health care provider which countries are considered high risk.  Your parents were born in a high-risk country, and you have not been immunized against hepatitis B (hepatitis B vaccine).  You have HIV or AIDS.  You use needles to inject street drugs.  You live with someone who has hepatitis B.  You have had sex with someone who has hepatitis B.  You get hemodialysis treatment.  You take certain medicines for conditions, including cancer, organ transplantation, and autoimmune conditions. Hepatitis C  Blood testing is recommended for:  Everyone born from [birth year] through 1964.  Anyone with known risk factors for hepatitis C. Sexually transmitted infections (STIs)  You should be screened for sexually transmitted infections (STIs) including gonorrhea and chlamydia if:  You are sexually active and are younger than 74 years of age.  You are older than 74 years of age and your health care provider tells you that you are at risk for this type of infection.  Your sexual activity  has changed since you were last screened and you are at an increased risk for chlamydia or gonorrhea. Ask your health care provider if you are at risk.  If you do not have HIV, but are at risk, it may be recommended that you take a prescription medicine daily to prevent HIV infection. This is called pre-exposure prophylaxis (PrEP). You are considered at risk if:  You are sexually active and do not regularly use condoms or know the HIV status of your partner(s).  You take  drugs by injection.  You are sexually active with a partner who has HIV. Talk with your health care provider about whether you are at high risk of being infected with HIV. If you choose to begin PrEP, you should first be tested for HIV. You should then be tested every 3 months for as long as you are taking PrEP.  PREGNANCY   If you are premenopausal and you may become pregnant, ask your health care provider about preconception counseling.  If you may become pregnant, take 400 to 800 micrograms (mcg) of folic acid every day.  If you want to prevent pregnancy, talk to your health care provider about birth control (contraception). OSTEOPOROSIS AND MENOPAUSE   Osteoporosis is a disease in which the bones lose minerals and strength with aging. This can result in serious bone fractures. Your risk for osteoporosis can be identified using a bone density scan.  If you are 15 years of age or older, or if you are at risk for osteoporosis and fractures, ask your health care provider if you should be screened.  Ask your health care provider whether you should take a calcium or vitamin D supplement to lower your risk for osteoporosis.  Menopause may have certain physical symptoms and risks.  Hormone replacement therapy may reduce some of these symptoms and risks. Talk to your health care provider about whether hormone replacement therapy is right for you.  HOME CARE INSTRUCTIONS   Schedule regular health, dental, and eye  exams.  Stay current with your immunizations.   Do not use any tobacco products including cigarettes, chewing tobacco, or electronic cigarettes.  If you are pregnant, do not drink alcohol.  If you are breastfeeding, limit how much and how often you drink alcohol.  Limit alcohol intake to no more than 1 drink per day for nonpregnant women. One drink equals 12 ounces of beer, 5 ounces of wine, or 1 ounces of hard liquor.  Do not use street drugs.  Do not share needles.  Ask your health care provider for help if you need support or information about quitting drugs.  Tell your health care provider if you often feel depressed.  Tell your health care provider if you have ever been abused or do not feel safe at home.   This information is not intended to replace advice given to you by your health care provider. Make sure you discuss any questions you have with your health care provider.   Document Released: 02/18/2011 Document Revised: 08/26/2014 Document Reviewed: 07/07/2013 Elsevier Interactive Patient Education [employer].

## 2015-09-08 NOTE — Progress Notes (Signed)
Subjective:    Patient ID: Emily [name], female    DOB: 08-17-1941, 75 y.o.   MRN: [id number]  HPI She is here to establish with a new pcp.  She is here for a PE.  She had a pnd drip all year round and has tried flonase and zyrtec.  The flonase did not help.  She thinks the oral anti-histamines helped.  She did not think that she had allergies, she has had these symptoms for a while. She denies any other concerning cold symptoms.  Nocturia:  She gets up 3-4 times a night to urinate.  She sometimes can not go back to sleep.  This has gotten worse over the years.  She does not drink a lot in the evening.  She denies daytime urinary frequency.  She denies any dysuria or hematuria.  When she was 25 or 26 she had flu like symptoms and she was told she may have had hepatitis. Her cousin also had similar symptoms. They had eaten at the same restaurant. Her cousin did develop jaundice. She was wondering if she needs to be tested for hepatitis.  She is taking all her medications daily as prescribed. She denies any major changes in her health since she was here last.  Medications and allergies reviewed with patient and updated if appropriate.  Patient Active Problem List   Diagnosis Date Noted  . Osteopenia 09/08/2015  . Fatigue 05/10/2015  . Post-nasal drip 05/10/2015  . Elevated hemoglobin ([place]) 04/26/2014  . GERD (gastroesophageal reflux disease) 04/14/2014  . Palpitations   . Pleural plaque without asbestos 12/03/2010  . CERVICALGIA 12/18/2009  . DIVERTICULOSIS, COLON 03/02/2009  . ENDOMETRIOSIS 03/02/2009  . DYSPLASTIC NEVUS, [place] 12/06/2008  . Lumbago 03/11/2008  . ACTINIC KERATOSIS 01/23/2008  . CARCINOMA, BREAST, ESTROGEN RECEPTOR NEGATIVE 10/12/2007  . Hypothyroidism 06/18/2007  . ALLERGIC RHINITIS 06/11/2007    Current Outpatient Prescriptions on File Prior to Visit  Medication Sig Dispense Refill  . atenolol (TENORMIN) 25 MG tablet Take 1 tablet (25 mg total) by mouth  daily. Must establish with NEW PCP for additional refills. 90 tablet 0  . Calcium-Vitamin D-Vitamin K (CALCIUM + D + K) [id number] MG-UNT-MCG TABS Take 1 tablet by mouth 2 (two) times daily.      Marland Kitchen levothyroxine (SYNTHROID, LEVOTHROID) 75 MCG tablet Take 1 tablet (75 mcg total) by mouth daily. 90 tablet 2   No current facility-administered medications on file prior to visit.    Past Medical History  Diagnosis Date  . Allergic rhinitis   . Squamous cell carcinoma ([place])     History of BC  . Heart palpitations   . Actinic keratosis   . BACK PAIN   . 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 Cervicalgia   . DIVERTICULOSIS, COLON   . ENDOMETRIOSIS   . Pleural plaque without asbestos     CT chest 10/2010: R>L lobes, upper> lower  . Cholelithiasis   . Hypertension   . HYPOTHYROIDISM   . GERD (gastroesophageal reflux disease)     Past Surgical History  Procedure Laterality Date  . Cholecystectomy  2010  . Endometrial ablation    . Breast lumpectomy  2001    left  . Tubal ligation    . Cataract extraction Bilateral 09/2014    both eyes    Social History   Social History  . Marital Status: Married    Spouse Name: N/A  .  Number of Children: 2  . Years of Education: N/A   Occupational History  . Retired     Chiropractor   Social History Main Topics  . Smoking status: Never Smoker   . Smokeless tobacco: Never Used  . Alcohol Use: No  . Drug Use: No  . Sexual Activity: Not Asked   Other Topics Concern  . None   Social History Narrative   Married, lives in [place] area since 2008 from [place] to be close to dtr    Family History  Problem Relation Age of Onset  . Heart disease Father   . Emphysema Father     smoked and worked with asbestos  . Colon cancer Neg Hx     Review of Systems  Constitutional: Negative for fever, chills and appetite change.  HENT: Positive for hearing loss and postnasal drip.   Eyes:  Negative for visual disturbance.  Respiratory: Negative for cough, shortness of breath and wheezing.   Cardiovascular: Positive for palpitations. Negative for chest pain and leg swelling.  Gastrointestinal: Negative for nausea, abdominal pain, diarrhea, constipation and blood in stool.       No GERD  Genitourinary: Positive for frequency. Negative for dysuria and hematuria.  Musculoskeletal: Positive for back pain (chronic).  Neurological: Positive for light-headedness (postural) and headaches (neck pain - causes it). Negative for dizziness.  Psychiatric/Behavioral: Negative for dysphoric mood. The patient is not nervous/anxious.        Objective:   Filed Vitals:   09/08/15 0837  BP: 102/74  Pulse: 56  Temp: 97.7 F (36.5 C)  Resp: 16   Filed Weights   09/08/15 0837  Weight: 123 lb (55.792 kg)   Body mass index is 23.62 kg/(m^2).   Physical Exam Constitutional: She appears well-developed and well-nourished. No distress.  HENT:  Head: Normocephalic and atraumatic.  Right Ear: External ear normal. Normal ear canal and TM Left Ear: External ear normal.  Normal ear canal and TM Mouth/Throat: Oropharynx is clear and moist.  Normal bilateral ear canals and tympanic membranes  Eyes: Conjunctivae and EOM are normal.  Neck: Neck supple. No tracheal deviation present. No thyromegaly present.  No carotid bruit  Cardiovascular: Normal rate, regular rhythm and normal heart sounds.   No murmur heard.  No edema. Pulmonary/Chest: Effort normal and breath sounds normal. No respiratory distress. She has no wheezes. She has no rales.  Breast: deferred to Gyn Abdominal: Soft. She exhibits no distension. There is no tenderness.  Lymphadenopathy: She has no cervical adenopathy.  Skin: Skin is warm and dry. She is not diaphoretic.  Psychiatric: She has a normal mood and affect. Her behavior is normal.       Assessment & Plan:   Physical exam: Screening blood work  ordered Immunizations   Up to date Colonoscopy   Up to date Mammogram  Up to date Gyn   up to date Dexa  Up to date Eye exams  Up to date EKG done 2015, no need to repeat Exercise-she just recently started walking regularly and I encouraged her to continue with this Weight is in the healthy range Skin sees derm annually Substance abuse-no evidence of substance abuse  Nocturia: Discussed possible causes, evaluation and treatment We'll refer to urology for further evaluation  See Problem List for Assessment and Plan of chronic medical problems.  Follow-up annually, sooner if needed

## 2015-09-08 NOTE — Assessment & Plan Note (Signed)
Taking levothyroxine 75 g daily Check TSH-titrate medication if needed

## 2015-09-08 NOTE — Assessment & Plan Note (Signed)
DEXA up-to-date Taking calcium and vitamin D daily Recently started walking, but was not exercising prior to that. She does have back pain, which limits her walking.

## 2015-09-10 ENCOUNTER — Encounter: Payer: Self-pay | Admitting: Internal Medicine

## 2015-09-10 DIAGNOSIS — E785 Hyperlipidemia, unspecified: Secondary | ICD-10-CM | POA: Insufficient documentation

## 2015-09-12 ENCOUNTER — Encounter: Payer: Self-pay | Admitting: Internal Medicine

## 2015-10-03 ENCOUNTER — Ambulatory Visit
Admission: RE | Admit: 2015-10-03 | Discharge: 2015-10-03 | Disposition: A | Discharge status: Home / Self Care | Payer: Medicare HMO | Source: Ambulatory Visit

## 2015-10-03 DIAGNOSIS — Z853 Personal history of malignant neoplasm of breast: Secondary | ICD-10-CM

## 2015-10-03 DIAGNOSIS — Z1231 Encounter for screening mammogram for malignant neoplasm of breast: Secondary | ICD-10-CM

## 2015-10-05 ENCOUNTER — Encounter: Payer: Self-pay | Admitting: Family

## 2016-03-28 ENCOUNTER — Telehealth: Payer: Self-pay | Admitting: Internal Medicine

## 2016-03-28 NOTE — Telephone Encounter (Signed)
Pt is scheduled for surgery with Dr. Veverly Fells at [facility] and she states they sent a surgery clearance to you. She is aware Dr. Quay Burow is out all week and she is wanting you to please call her on her cell#[phone]

## 2016-03-28 NOTE — Telephone Encounter (Signed)
Spoke with pt, she will let [facility] know that the clearance will be faxed over once Dr Quay Burow returns  On 8/14

## 2016-04-04 NOTE — Telephone Encounter (Signed)
Pt calling to check up on this request, please call her back

## 2016-04-04 NOTE — Telephone Encounter (Signed)
Given to you - sorry for the delay

## 2016-04-04 NOTE — Telephone Encounter (Signed)
SPoke with pt to inform.  

## 2016-04-04 NOTE — Telephone Encounter (Signed)
Please advise, I think you still have the clearance paper.

## 2016-04-15 ENCOUNTER — Telehealth: Payer: Self-pay | Admitting: Gastroenterology

## 2016-04-15 NOTE — Telephone Encounter (Signed)
Patient reports loose stools and nausea.  Loose stools are increasing in number for the last few weeks.  She will come in and see Alonza Bogus, PA on 04/16/16 10:30

## 2016-04-16 ENCOUNTER — Encounter: Payer: Self-pay | Admitting: Gastroenterology

## 2016-04-16 ENCOUNTER — Ambulatory Visit (INDEPENDENT_AMBULATORY_CARE_PROVIDER_SITE_OTHER): Payer: Medicare HMO | Admitting: Gastroenterology

## 2016-04-16 ENCOUNTER — Telehealth: Payer: Self-pay | Admitting: Gastroenterology

## 2016-04-16 VITALS — HR 60 | Ht 60.5 in | Wt 123.1 lb

## 2016-04-16 DIAGNOSIS — R195 Other fecal abnormalities: Secondary | ICD-10-CM | POA: Insufficient documentation

## 2016-04-16 DIAGNOSIS — K589 Irritable bowel syndrome without diarrhea: Secondary | ICD-10-CM | POA: Insufficient documentation

## 2016-04-16 DIAGNOSIS — R110 Nausea: Secondary | ICD-10-CM | POA: Diagnosis not present

## 2016-04-16 MED ORDER — SACCHAROMYCES BOULARDII 250 MG PO CAPS: 250.0000 mg | ORAL_CAPSULE | Freq: Two times a day (BID) | ORAL | 3 refills | Status: DC

## 2016-04-16 MED ORDER — RIFAXIMIN 550 MG PO TABS: 550.0000 mg | ORAL_TABLET | Freq: Three times a day (TID) | ORAL | 0 refills | Status: DC

## 2016-04-16 NOTE — Progress Notes (Signed)
     04/16/2016 Emily [name] [id number] Feb 03, 1941   History of Present Illness:  This is a 75 year old female who is previously known to Dr. Olevia Perches. She actually has an appointment to establish care with Dr. Havery Moros in early November. She was put in for an office visit with me today due to complaints of loose stools and nausea. She tells me that normally she has 1 bowel movement every morning. For the past 2 weeks she's been having 2-3 loose stools a day. Then over the weekend she developed some nausea with belching that occurs intermittently throughout the day. She says that she actually took an Imodium on Sunday and then did not have a bowel movement yesterday and had a formed stool today.  According to Dr. Nichola Sizer note when she was last seen in May 2016 she was placed on a probiotic at that time for possible intestinal bacterial overgrowth. She denies any new medications or recent antibiotics. No recent travel. No specific food triggers or bad food exposure recently. She denies seeing blood in her stool.  No abdominal pain but reports that it is just diffusely uncomfortable with increased activity.   Current Medications, Allergies, Past Medical History, Past Surgical History, Family History and Social History were reviewed in [employer] record.   Physical Exam: Pulse 60   Ht 5' 0.5" (1.537 m)   Wt 123 lb 2 oz (55.8 kg)   BMI 23.65 kg/m  General: Well developed white female in no acute distress Head: Normocephalic and atraumatic Eyes:  Sclerae anicteric, conjunctiva pink  Ears: Normal auditory acuity Lungs: Clear throughout to auscultation Heart: Regular rate and rhythm Abdomen: Soft, non-distended.  Normal bowel sounds.  Minimal diffuse TTP. Musculoskeletal: Symmetrical with no gross deformities  Extremities: No edema  Neurological: Alert oriented x 4, grossly non-focal Psychological:  Alert and cooperative. Normal mood and affect  Assessment and  Recommendations: -75 year old female with 2-3 episodes of loose stools daily for the past few weeks with some nausea. Possibly some type of infectious source with some lingering postinfectious symptoms. Question IBS. No other red flag symptoms. I doubt that stool studies will be revealing at this point and she actually did not have a bowel movement yesterday and had a formed stool today. Hopefully symptoms are improving/resolving at this point. We will treat her with a course of Xifaxan 550 mg 3 times a day since the side effect profile is low. We'll also have her begin taking a daily probiotic such as align or Florastor.  Otherwise she has an appointment to establish care with Dr. Havery Moros in early November.

## 2016-04-16 NOTE — Telephone Encounter (Signed)
Fax sent to [facility].

## 2016-04-16 NOTE — Progress Notes (Signed)
Agree with assessment and plan as outlined. She can otherwise use immodium PRN if this has worked previously for her.

## 2016-04-16 NOTE — Patient Instructions (Signed)
We have sent your demographic information and a prescription for Xifaxan to [facility]. This pharmacy is able to get medication approved through insurance and get you the lowest copay possible. If you have not heard from them within 1 week, please call our office at [phone] to let us know.  We have sent the following medications to your pharmacy for you to pick up at your convenience: Florastor twice a day

## 2016-04-18 NOTE — Telephone Encounter (Signed)
Patient states that her copay for xifixan is $553. She states that she cannot afford this medication. She states that she is feeling "much better". Best # [phone]

## 2016-04-18 NOTE — Telephone Encounter (Signed)
Patient states Emily [name] is too expensive. She has however been feeling much better since starting the probiotic. She has not had anymore nausea or loose stools. Please advise if you would like her to start on something else or current regimen is ok?  Thanks

## 2016-04-18 NOTE — Telephone Encounter (Signed)
If she's feeling better on the probiotic alone she can just continue it. She has seen Janett Billow in the clinic, I have not met her yet but will see her at a follow up scheduled in November. We can discuss long term regimen at that visit, if she feels poorly in the interim she can contact us. Thanks

## 2016-04-19 ENCOUNTER — Other Ambulatory Visit: Payer: Self-pay | Admitting: Internal Medicine

## 2016-04-19 ENCOUNTER — Telehealth: Payer: Self-pay | Admitting: Gastroenterology

## 2016-04-24 NOTE — Telephone Encounter (Signed)
Patient informed and verbalized understanding

## 2016-05-09 ENCOUNTER — Other Ambulatory Visit: Payer: Self-pay | Admitting: Internal Medicine

## 2016-06-25 ENCOUNTER — Ambulatory Visit: Payer: Medicare HMO | Admitting: Gastroenterology

## 2016-07-16 ENCOUNTER — Ambulatory Visit: Payer: Medicare HMO

## 2016-07-25 ENCOUNTER — Ambulatory Visit (INDEPENDENT_AMBULATORY_CARE_PROVIDER_SITE_OTHER): Payer: Medicare HMO

## 2016-07-25 DIAGNOSIS — Z23 Encounter for immunization: Secondary | ICD-10-CM

## 2016-08-02 ENCOUNTER — Other Ambulatory Visit: Payer: Self-pay | Admitting: Obstetrics and Gynecology

## 2016-08-02 DIAGNOSIS — Z853 Personal history of malignant neoplasm of breast: Secondary | ICD-10-CM

## 2016-08-02 DIAGNOSIS — Z1231 Encounter for screening mammogram for malignant neoplasm of breast: Secondary | ICD-10-CM

## 2016-08-20 DIAGNOSIS — J069 Acute upper respiratory infection, unspecified: Secondary | ICD-10-CM | POA: Diagnosis not present

## 2016-09-17 DIAGNOSIS — Z01419 Encounter for gynecological examination (general) (routine) without abnormal findings: Secondary | ICD-10-CM | POA: Diagnosis not present

## 2016-09-19 DIAGNOSIS — H40013 Open angle with borderline findings, low risk, bilateral: Secondary | ICD-10-CM | POA: Diagnosis not present

## 2016-10-07 ENCOUNTER — Ambulatory Visit
Admission: RE | Admit: 2016-10-07 | Discharge: 2016-10-07 | Disposition: A | Discharge status: Home / Self Care | Payer: Medicare HMO | Source: Ambulatory Visit | Attending: Obstetrics and Gynecology | Admitting: Obstetrics and Gynecology

## 2016-10-07 DIAGNOSIS — Z1231 Encounter for screening mammogram for malignant neoplasm of breast: Secondary | ICD-10-CM | POA: Diagnosis not present

## 2016-10-07 DIAGNOSIS — Z853 Personal history of malignant neoplasm of breast: Secondary | ICD-10-CM

## 2016-10-10 DIAGNOSIS — Z1212 Encounter for screening for malignant neoplasm of rectum: Secondary | ICD-10-CM | POA: Diagnosis not present

## 2016-10-27 DIAGNOSIS — S52502A Unspecified fracture of the lower end of left radius, initial encounter for closed fracture: Secondary | ICD-10-CM | POA: Diagnosis not present

## 2016-11-01 DIAGNOSIS — S52532A Colles' fracture of left radius, initial encounter for closed fracture: Secondary | ICD-10-CM | POA: Diagnosis not present

## 2016-11-06 ENCOUNTER — Encounter: Payer: Self-pay | Admitting: Internal Medicine

## 2016-11-06 ENCOUNTER — Ambulatory Visit (INDEPENDENT_AMBULATORY_CARE_PROVIDER_SITE_OTHER): Payer: Medicare HMO | Admitting: Internal Medicine

## 2016-11-06 ENCOUNTER — Other Ambulatory Visit (INDEPENDENT_AMBULATORY_CARE_PROVIDER_SITE_OTHER): Payer: Medicare HMO

## 2016-11-06 VITALS — BP 110/60 | HR 61 | Temp 97.5°F | Ht 60.5 in | Wt 125.0 lb

## 2016-11-06 DIAGNOSIS — R002 Palpitations: Secondary | ICD-10-CM

## 2016-11-06 DIAGNOSIS — S62102A Fracture of unspecified carpal bone, left wrist, initial encounter for closed fracture: Secondary | ICD-10-CM | POA: Insufficient documentation

## 2016-11-06 DIAGNOSIS — R413 Other amnesia: Secondary | ICD-10-CM

## 2016-11-06 DIAGNOSIS — E038 Other specified hypothyroidism: Secondary | ICD-10-CM | POA: Diagnosis not present

## 2016-11-06 DIAGNOSIS — Z0000 Encounter for general adult medical examination without abnormal findings: Secondary | ICD-10-CM | POA: Diagnosis not present

## 2016-11-06 DIAGNOSIS — M85869 Other specified disorders of bone density and structure, unspecified lower leg: Secondary | ICD-10-CM

## 2016-11-06 DIAGNOSIS — R7989 Other specified abnormal findings of blood chemistry: Secondary | ICD-10-CM

## 2016-11-06 HISTORY — DX: Fracture of unspecified carpal bone, left wrist, initial encounter for closed fracture: S62.102A

## 2016-11-06 LAB — LIPID PANEL
CHOL/HDL RATIO: 5
CHOLESTEROL: 218 mg/dL — AB (ref 0–200)
HDL: 48.4 mg/dL (ref >39.00)
NonHDL: 169.85
Triglycerides: 251 mg/dL — ABNORMAL HIGH (ref 0.0–149.0)
VLDL: 50.2 mg/dL — ABNORMAL HIGH (ref 0.0–40.0)

## 2016-11-06 LAB — CBC WITH DIFFERENTIAL/PLATELET
BASOS ABS: 0.1 10*3/uL (ref 0.0–0.1)
Basophils Relative: 0.7 % (ref 0.0–3.0)
EOS ABS: 0.2 10*3/uL (ref 0.0–0.7)
Eosinophils Relative: 2.5 % (ref 0.0–5.0)
HCT: 44.2 % (ref 36.0–46.0)
HEMOGLOBIN: 14.8 g/dL (ref 12.0–15.0)
Lymphocytes Relative: 31.9 % (ref 12.0–46.0)
Lymphs Abs: 2.4 10*3/uL (ref 0.7–4.0)
MCHC: 33.4 g/dL (ref 30.0–36.0)
MCV: 88.4 fl (ref 78.0–100.0)
MONO ABS: 0.8 10*3/uL (ref 0.1–1.0)
Monocytes Relative: 10.2 % (ref 3.0–12.0)
Neutro Abs: 4.2 10*3/uL (ref 1.4–7.7)
Neutrophils Relative %: 54.7 % (ref 43.0–77.0)
Platelets: 221 10*3/uL (ref 150.0–400.0)
RBC: 5.01 Mil/uL (ref 3.87–5.11)
RDW: 12.9 % (ref 11.5–15.5)
WBC: 7.6 10*3/uL (ref 4.0–10.5)

## 2016-11-06 LAB — COMPREHENSIVE METABOLIC PANEL
ALBUMIN: 4.3 g/dL (ref 3.5–5.2)
ALK PHOS: 77 U/L (ref 39–117)
ALT: 19 U/L (ref 0–35)
AST: 22 U/L (ref 0–37)
BILIRUBIN TOTAL: 0.4 mg/dL (ref 0.2–1.2)
BUN: 19 mg/dL (ref 6–23)
CHLORIDE: 103 meq/L (ref 96–112)
CO2: 28 mEq/L (ref 19–32)
CREATININE: 0.95 mg/dL (ref 0.40–1.20)
Calcium: 10.3 mg/dL (ref 8.4–10.5)
GFR: 61.05 mL/min (ref >60.00)
Glucose, Bld: 82 mg/dL (ref 70–99)
Potassium: 3.8 mEq/L (ref 3.5–5.1)
SODIUM: 137 meq/L (ref 135–145)
TOTAL PROTEIN: 7.4 g/dL (ref 6.0–8.3)

## 2016-11-06 LAB — TSH: TSH: 2.49 u[IU]/mL (ref 0.35–4.50)

## 2016-11-06 LAB — VITAMIN D 25 HYDROXY (VIT D DEFICIENCY, FRACTURES): VITD: 28.31 ng/mL — AB (ref 30.00–100.00)

## 2016-11-06 LAB — LDL CHOLESTEROL, DIRECT: LDL DIRECT: 134 mg/dL

## 2016-11-06 LAB — VITAMIN B12: VITAMIN B 12: 817 pg/mL (ref 211–911)

## 2016-11-06 NOTE — Assessment & Plan Note (Signed)
Check tsh  Titrate med dose if needed  

## 2016-11-06 NOTE — Progress Notes (Signed)
Subjective:    Patient ID: Emily [name], female    DOB: [birth year], 76 y.o.   MRN: [id number]  HPI Here for medicare wellness exam and for a physical exam.   I have personally reviewed and have noted 1.The patient's medical and social history 2.Their use of alcohol, tobacco or illicit drugs 3.Their current medications and supplements 4.The patient's functional ability including ADL's, fall risks, home safety risks and                 hearing or visual impairment. 5.Diet and physical activities 6.Evidence for depression or mood disorders 7.Care team reviewed - gyn Dr Garwin Brothers, ortho - [place], derm   Are there smokers in your home (other than you)? No  Risk Factors Exercise: usually walks for exercise - has not been able to exercise in last 6 months Dietary issues discussed: well balanced, no fried foods  Cardiac risk factors: advanced age   Depression Screen  Have you felt down, depressed or hopeless? No  Have you felt little interest or pleasure in doing things?  No  Activities of Daily Living In your present state of health, do you have any difficulty performing the following activities?:  Driving? No Managing money?  No Feeding yourself? No Getting from bed to chair? No Climbing a flight of stairs? No Preparing food and eating?: No Bathing or showering? No Getting dressed: No Getting to/using the toilet? No Moving around from place to place: No In the past year have you fallen or had a near fall?: yes, fell off of stool  - broke wrist     Do you have more than one partner?  No   Hearing Difficulties:  Do you often ask people to speak up or repeat themselves? Yes - has audiology appt Do you experience ringing or noises in your ears? sometimes Do you have difficulty understanding soft or whispered voices? Yes  Vision:              Any change in vision:  no             Up to date with eye exam:    yes  Memory:  Both her mom and maternal aunt had dementia  Do you feel that you have a problem with memory? Yes, difficulty with short term memory - lists, conversations with husband, coming up with words, not remember why she goes into a room.   Do you often misplace items? No  Do you feel safe at home?  Yes  Cognitive Testing  Alert, Orientated? Yes  Normal Appearance? Yes  Recall of three objects?  Yes  - phone, cloud, pencil   3/3  Can perform simple calculations? Yes - she does all finances  Displays appropriate judgment? Yes  Can read the correct time from a watch face? Yes   Advanced Directives have been discussed with the patient? Yes - in place     Medications and allergies reviewed with patient and updated if appropriate.  Patient Active Problem List   Diagnosis Date Noted  . Loose stools 04/16/2016  . Nausea without vomiting 04/16/2016  . IBS (irritable bowel syndrome) 04/16/2016  . Hyperlipidemia 09/10/2015  . Osteopenia 09/08/2015  . Fatigue 05/10/2015  . Post-nasal drip 05/10/2015  . Elevated hemoglobin ([place]) 04/26/2014  . Palpitations   . Pleural plaque without asbestos 12/03/2010  . CERVICALGIA 12/18/2009  . DIVERTICULOSIS, COLON 03/02/2009  . ENDOMETRIOSIS 03/02/2009  . DYSPLASTIC NEVUS, [place] 12/06/2008  . Lumbago 03/11/2008  .  ACTINIC KERATOSIS 01/23/2008  . CARCINOMA, BREAST, ESTROGEN RECEPTOR NEGATIVE 10/12/2007  . Hypothyroidism 06/18/2007  . ALLERGIC RHINITIS 06/11/2007    Current Outpatient Prescriptions on File Prior to Visit  Medication Sig Dispense Refill  . atenolol (TENORMIN) 25 MG tablet TAKE 1 TABLET (25 MG TOTAL) BY MOUTH DAILY. 90 tablet 1  . Calcium-Vitamin D-Vitamin K (CALCIUM + D + K) [id number] MG-UNT-MCG TABS Take 1 tablet by mouth 2 (two) times daily.      Marland Kitchen levothyroxine (SYNTHROID, LEVOTHROID) 75 MCG tablet TAKE 1 TABLET (75 MCG TOTAL) BY MOUTH DAILY. 90 tablet 1   No current facility-administered medications on file prior to  visit.     Past Medical History:  Diagnosis Date  . Actinic keratosis   . Allergic rhinitis   . BACK PAIN   . 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 Cervicalgia   . Cholelithiasis   . DIVERTICULOSIS, COLON   . ENDOMETRIOSIS   . GERD (gastroesophageal reflux disease)   . Heart palpitations   . Hypertension   . HYPOTHYROIDISM   . Pleural plaque without asbestos    CT chest 10/2010: R>L lobes, upper> lower  . Squamous cell carcinoma    History of BC    Past Surgical History:  Procedure Laterality Date  . BREAST LUMPECTOMY  2001   left  . CATARACT EXTRACTION Bilateral 09/2014   both eyes  . CHOLECYSTECTOMY  2010  . ENDOMETRIAL ABLATION    . EXCISION / BIOPSY BREAST / NIPPLE / DUCT Left 2001  . TUBAL LIGATION      Social History   Social History  . Marital status: Married    Spouse name: N/A  . Number of children: 2  . Years of education: N/A   Occupational History  . Retired     Chiropractor   Social History Main Topics  . Smoking status: Never Smoker  . Smokeless tobacco: Never Used  . Alcohol use No  . Drug use: No  . Sexual activity: Not Asked   Other Topics Concern  . None   Social History Narrative   Married, lives in [place] area since 2008 from [place] to be close to dtr    Family History  Problem Relation Age of Onset  . Heart disease Father   . Emphysema Father     smoked and worked with asbestos  . Colon cancer Neg Hx     Review of Systems  Constitutional: Negative for chills and fever.  HENT: Positive for hearing loss, rhinorrhea and tinnitus.   Eyes: Negative for visual disturbance.  Respiratory: Negative for cough, shortness of breath and wheezing.   Cardiovascular: Negative for chest pain, palpitations and leg swelling.  Gastrointestinal: Negative for abdominal pain, blood in stool, constipation, diarrhea and nausea.       No gerd  Genitourinary: Negative for dysuria and hematuria.    Skin: Negative for color change and rash.  Neurological: Negative for dizziness, light-headedness and headaches.  Psychiatric/Behavioral: Negative for dysphoric mood. The patient is not nervous/anxious.        Objective:   Vitals:   11/06/16 1108  BP: 110/60  Pulse: 61  Temp: 97.5 F (36.4 C)   Wt Readings from Last 3 Encounters:  11/06/16 125 lb (56.7 kg)  04/16/16 123 lb 2 oz (55.8 kg)  09/08/15 123 lb (55.8 kg)   Body mass index is 24.01 kg/m.   Physical Exam    Constitutional: She  appears well-developed and well-nourished. No distress.  HENT:  Head: Normocephalic and atraumatic.  Right Ear: External ear normal. Normal ear canal and TM Left Ear: External ear normal.  Normal ear canal and TM Mouth/Throat: Oropharynx is clear and moist.  Eyes: Conjunctivae and EOM are normal.  Neck: Neck supple. No tracheal deviation present. No thyromegaly present.  No carotid bruit  Cardiovascular: Normal rate, regular rhythm and normal heart sounds.   No murmur heard.  No edema. Pulmonary/Chest: Effort normal and breath sounds normal. No respiratory distress. She has no wheezes. She has no rales.  Breast: deferred to Gyn Abdominal: Soft. She exhibits no distension. There is no tenderness.  Lymphadenopathy: She has no cervical adenopathy.  Skin: Skin is warm and dry. She is not diaphoretic.  Psychiatric: She has a normal mood and affect. Her behavior is normal.       Assessment & Plan:    Wellness Exam: Immunizations   Up to date  Colonoscopy   Up to date  Mammogram  Up to date  Dexa  - due 06/2017 Gyn  Up to date  Eye exam   Up to date  Hearing loss   Has some hearing loss - will go see audiology - arranging on her own Memory concerns/difficulties  Yes - will check labs - discussed neuro evaluation Independent of ADLs  fully Stressed the importance of regular exercise   Patient received copy of preventative screening tests/immunizations recommended for the next  5-10 years.    Physical exam: Screening blood work  ordered Immunizations   Up to date  Colonoscopy   Up to date  Mammogram   Up to date  Dexa  - due 06/2017 Gyn  Up to date  Eye exam   Up to date  Exercise - not regularly recently - will restart when able -- add light weights or yoga to walking Weight  BMI is good  Skin no concerns, sees derm Substance abuse   none  See Problem List for Assessment and Plan of chronic medical problems.   FU annually

## 2016-11-06 NOTE — Assessment & Plan Note (Signed)
Controlled with atenolol - trying to decrease dose

## 2016-11-06 NOTE — Patient Instructions (Addendum)
Emily [name] , Thank you for taking time to come for your Medicare Wellness Visit. I appreciate your ongoing commitment to your health goals. Please review the following plan we discussed and let me know if I can assist you in the future.   These are the goals we discussed: Goals    . Exercise 150 minutes per week (moderate activity)                 This is a list of the screening recommended for you and due dates:  Health Maintenance  Topic Date Due  . Tetanus Vaccine  06/17/2017  . DEXA scan (bone density measurement)  07/16/2017  . Mammogram  10/07/2018  . Colon Cancer Screening  04/13/2019  . Flu Shot  Completed  . Pneumonia vaccines  Completed     Test(s) ordered today. Your results will be released to [facility] (or called to you) after review, usually within 72hours after test completion. If any changes need to be made, you will be notified at that same time.  All other Health Maintenance issues reviewed.   All recommended immunizations and age-appropriate screenings are up-to-date or discussed.  No immunizations administered today.   Medications reviewed and updated.  No changes recommended at this time.    Please followup in one year    Health Maintenance, Female Adopting a healthy lifestyle and getting preventive care can go a long way to promote health and wellness. Talk with your health care provider about what schedule of regular examinations is right for you. This is a good chance for you to check in with your provider about disease prevention and staying healthy. In between checkups, there are plenty of things you can do on your own. Experts have done a lot of research about which lifestyle changes and preventive measures are most likely to keep you healthy. Ask your health care provider for more information. Weight and diet Eat a healthy diet  Be sure to include plenty of vegetables, fruits, low-fat dairy products, and lean protein.  Do not eat a lot of  foods high in solid fats, added sugars, or salt.  Get regular exercise. This is one of the most important things you can do for your health.  Most adults should exercise for at least 150 minutes each week. The exercise should increase your heart rate and make you sweat (moderate-intensity exercise).  Most adults should also do strengthening exercises at least twice a week. This is in addition to the moderate-intensity exercise. Maintain a healthy weight  Body mass index (BMI) is a measurement that can be used to identify possible weight problems. It estimates body fat based on height and weight. Your health care provider can help determine your BMI and help you achieve or maintain a healthy weight.  For females 44 years of age and older:  A BMI below 18.5 is considered underweight.  A BMI of 18.5 to 24.9 is normal.  A BMI of 25 to 29.9 is considered overweight.  A BMI of 30 and above is considered obese. Watch levels of cholesterol and blood lipids  You should start having your blood tested for lipids and cholesterol at 75 years of age, then have this test every 5 years.  You may need to have your cholesterol levels checked more often if:  Your lipid or cholesterol levels are high.  You are older than 75 years of age.  You are at high risk for heart disease. Cancer screening Lung Cancer  Lung cancer  screening is recommended for adults 81-32 years old who are at high risk for lung cancer because of a history of smoking.  A yearly low-dose CT scan of the lungs is recommended for people who:  Currently smoke.  Have quit within the past 15 years.  Have at least a 30-pack-year history of smoking. A pack year is smoking an average of one pack of cigarettes a day for 1 year.  Yearly screening should continue until it has been 15 years since you quit.  Yearly screening should stop if you develop a health problem that would prevent you from having lung cancer treatment. Breast  Cancer  Practice breast self-awareness. This means understanding how your breasts normally appear and feel.  It also means doing regular breast self-exams. Let your health care provider know about any changes, no matter how small.  If you are in your 20s or 30s, you should have a clinical breast exam (CBE) by a health care provider every 1-3 years as part of a regular health exam.  If you are 74 or older, have a CBE every year. Also consider having a breast X-ray (mammogram) every year.  If you have a family history of breast cancer, talk to your health care provider about genetic screening.  If you are at high risk for breast cancer, talk to your health care provider about having an MRI and a mammogram every year.  Breast cancer gene (BRCA) assessment is recommended for women who have family members with BRCA-related cancers. BRCA-related cancers include:  Breast.  Ovarian.  Tubal.  Peritoneal cancers.  Results of the assessment will determine the need for genetic counseling and BRCA1 and BRCA2 testing. Cervical Cancer  Your health care provider may recommend that you be screened regularly for cancer of the pelvic organs (ovaries, uterus, and vagina). This screening involves a pelvic examination, including checking for microscopic changes to the surface of your cervix (Pap test). You may be encouraged to have this screening done every 3 years, beginning at age 37.  For women ages 30-65, health care providers may recommend pelvic exams and Pap testing every 3 years, or they may recommend the Pap and pelvic exam, combined with testing for human papilloma virus (HPV), every 5 years. Some types of HPV increase your risk of cervical cancer. Testing for HPV may also be done on women of any age with unclear Pap test results.  Other health care providers may not recommend any screening for nonpregnant women who are considered low risk for pelvic cancer and who do not have symptoms. Ask your  health care provider if a screening pelvic exam is right for you.  If you have had past treatment for cervical cancer or a condition that could lead to cancer, you need Pap tests and screening for cancer for at least 20 years after your treatment. If Pap tests have been discontinued, your risk factors (such as having a new sexual partner) need to be reassessed to determine if screening should resume. Some women have medical problems that increase the chance of getting cervical cancer. In these cases, your health care provider may recommend more frequent screening and Pap tests. Colorectal Cancer  This type of cancer can be detected and often prevented.  Routine colorectal cancer screening usually begins at 75 years of age and continues through 75 years of age.  Your health care provider may recommend screening at an earlier age if you have risk factors for colon cancer.  Your health care provider may  also recommend using home test kits to check for hidden blood in the stool.  A small camera at the end of a tube can be used to examine your colon directly (sigmoidoscopy or colonoscopy). This is done to check for the earliest forms of colorectal cancer.  Routine screening usually begins at age 61.  Direct examination of the colon should be repeated every 5-10 years through 75 years of age. However, you may need to be screened more often if early forms of precancerous polyps or small growths are found. Skin Cancer  Check your skin from head to toe regularly.  Tell your health care provider about any new moles or changes in moles, especially if there is a change in a mole's shape or color.  Also tell your health care provider if you have a mole that is larger than the size of a pencil eraser.  Always use sunscreen. Apply sunscreen liberally and repeatedly throughout the day.  Protect yourself by wearing long sleeves, pants, a wide-brimmed hat, and sunglasses whenever you are outside. Heart  disease, diabetes, and high blood pressure  High blood pressure causes heart disease and increases the risk of stroke. High blood pressure is more likely to develop in:  People who have blood pressure in the high end of the normal range (130-139/85-89 mm Hg).  People who are overweight or obese.  People who are African American.  If you are 61-33 years of age, have your blood pressure checked every 3-5 years. If you are 73 years of age or older, have your blood pressure checked every year. You should have your blood pressure measured twice-once when you are at a hospital or clinic, and once when you are not at a hospital or clinic. Record the average of the two measurements. To check your blood pressure when you are not at a hospital or clinic, you can use:  An automated blood pressure machine at a pharmacy.  A home blood pressure monitor.  If you are between 50 years and 4 years old, ask your health care provider if you should take aspirin to prevent strokes.  Have regular diabetes screenings. This involves taking a blood sample to check your fasting blood sugar level.  If you are at a normal weight and have a low risk for diabetes, have this test once every three years after 75 years of age.  If you are overweight and have a high risk for diabetes, consider being tested at a younger age or more often. Preventing infection Hepatitis B  If you have a higher risk for hepatitis B, you should be screened for this virus. You are considered at high risk for hepatitis B if:  You were born in a country where hepatitis B is common. Ask your health care provider which countries are considered high risk.  Your parents were born in a high-risk country, and you have not been immunized against hepatitis B (hepatitis B vaccine).  You have HIV or AIDS.  You use needles to inject street drugs.  You live with someone who has hepatitis B.  You have had sex with someone who has hepatitis  B.  You get hemodialysis treatment.  You take certain medicines for conditions, including cancer, organ transplantation, and autoimmune conditions. Hepatitis C  Blood testing is recommended for:  Everyone born from [birth year] through 1964.  Anyone with known risk factors for hepatitis C. Sexually transmitted infections (STIs)  You should be screened for sexually transmitted infections (STIs) including gonorrhea and chlamydia  if:  You are sexually active and are younger than 75 years of age.  You are older than 75 years of age and your health care provider tells you that you are at risk for this type of infection.  Your sexual activity has changed since you were last screened and you are at an increased risk for chlamydia or gonorrhea. Ask your health care provider if you are at risk.  If you do not have HIV, but are at risk, it may be recommended that you take a prescription medicine daily to prevent HIV infection. This is called pre-exposure prophylaxis (PrEP). You are considered at risk if:  You are sexually active and do not regularly use condoms or know the HIV status of your partner(s).  You take drugs by injection.  You are sexually active with a partner who has HIV. Talk with your health care provider about whether you are at high risk of being infected with HIV. If you choose to begin PrEP, you should first be tested for HIV. You should then be tested every 3 months for as long as you are taking PrEP. Pregnancy  If you are premenopausal and you may become pregnant, ask your health care provider about preconception counseling.  If you may become pregnant, take 400 to 800 micrograms (mcg) of folic acid every day.  If you want to prevent pregnancy, talk to your health care provider about birth control (contraception). Osteoporosis and menopause  Osteoporosis is a disease in which the bones lose minerals and strength with aging. This can result in serious bone fractures. Your  risk for osteoporosis can be identified using a bone density scan.  If you are 69 years of age or older, or if you are at risk for osteoporosis and fractures, ask your health care provider if you should be screened.  Ask your health care provider whether you should take a calcium or vitamin D supplement to lower your risk for osteoporosis.  Menopause may have certain physical symptoms and risks.  Hormone replacement therapy may reduce some of these symptoms and risks. Talk to your health care provider about whether hormone replacement therapy is right for you. Follow these instructions at home:  Schedule regular health, dental, and eye exams.  Stay current with your immunizations.  Do not use any tobacco products including cigarettes, chewing tobacco, or electronic cigarettes.  If you are pregnant, do not drink alcohol.  If you are breastfeeding, limit how much and how often you drink alcohol.  Limit alcohol intake to no more than 1 drink per day for nonpregnant women. One drink equals 12 ounces of beer, 5 ounces of wine, or 1 ounces of hard liquor.  Do not use street drugs.  Do not share needles.  Ask your health care provider for help if you need support or information about quitting drugs.  Tell your health care provider if you often feel depressed.  Tell your health care provider if you have ever been abused or do not feel safe at home. This information is not intended to replace advice given to you by your health care provider. Make sure you discuss any questions you have with your health care provider. Document Released: 02/18/2011 Document Revised: 01/11/2016 Document Reviewed: 05/09/2015 Elsevier Interactive Patient Education  2017 [employer].

## 2016-11-06 NOTE — Assessment & Plan Note (Signed)
dexa due 11/18 Usually walks - has not been able to - will restart when able Add yoga or weights Taking 1200 mg calcium, ? D daily Will check vitamin d level

## 2016-11-06 NOTE — Progress Notes (Signed)
Pre visit review using our clinic review tool, if applicable. No additional management support is needed unless otherwise documented below in the visit note. 

## 2016-11-06 NOTE — Assessment & Plan Note (Signed)
Having short term memory concerns - forget lists, conversations with husband, diffic coming up with worse, does not remember why she goes into a room - getting worse Mom and m aunt had dementia Check labs, including B12 and tsh Consider neurological evaluation

## 2016-11-10 ENCOUNTER — Encounter: Payer: Self-pay | Admitting: Internal Medicine

## 2016-11-11 ENCOUNTER — Other Ambulatory Visit: Payer: Self-pay | Admitting: *Deleted

## 2016-11-11 MED ORDER — LEVOTHYROXINE SODIUM 75 MCG PO TABS: 75.0000 ug | ORAL_TABLET | Freq: Every day | ORAL | 3 refills | Status: DC

## 2016-11-11 MED ORDER — ATENOLOL 25 MG PO TABS: ORAL_TABLET | ORAL | 3 refills | Status: DC

## 2016-11-11 NOTE — Telephone Encounter (Signed)
Pt left msg on triage stating need refill on her Levothyroxine and atenolol. Notified pt rx has been sent yo [place] mail service...Chryl Heck

## 2016-11-11 NOTE — Telephone Encounter (Signed)
Pt called triage thia am refills has already been sent...Emily [name]

## 2016-11-13 MED ORDER — ATENOLOL 25 MG PO TABS: 25.0000 mg | ORAL_TABLET | Freq: Every day | ORAL | 3 refills | Status: DC

## 2016-11-13 MED ORDER — LEVOTHYROXINE SODIUM 75 MCG PO TABS: 75.0000 ug | ORAL_TABLET | Freq: Every day | ORAL | 3 refills | Status: DC

## 2016-11-13 NOTE — Addendum Note (Signed)
Addended by: Binnie Rail on: 11/13/2016 09:09 PM   Modules accepted: Orders

## 2016-11-19 DIAGNOSIS — S52532D Colles' fracture of left radius, subsequent encounter for closed fracture with routine healing: Secondary | ICD-10-CM | POA: Diagnosis not present

## 2016-12-03 DIAGNOSIS — S52532D Colles' fracture of left radius, subsequent encounter for closed fracture with routine healing: Secondary | ICD-10-CM | POA: Diagnosis not present

## 2016-12-07 DIAGNOSIS — Z9289 Personal history of other medical treatment: Secondary | ICD-10-CM | POA: Diagnosis not present

## 2016-12-07 DIAGNOSIS — J020 Streptococcal pharyngitis: Secondary | ICD-10-CM | POA: Diagnosis not present

## 2016-12-07 DIAGNOSIS — J029 Acute pharyngitis, unspecified: Secondary | ICD-10-CM | POA: Diagnosis not present

## 2016-12-11 DIAGNOSIS — H9113 Presbycusis, bilateral: Secondary | ICD-10-CM | POA: Diagnosis not present

## 2016-12-11 DIAGNOSIS — H903 Sensorineural hearing loss, bilateral: Secondary | ICD-10-CM | POA: Diagnosis not present

## 2016-12-13 ENCOUNTER — Ambulatory Visit (INDEPENDENT_AMBULATORY_CARE_PROVIDER_SITE_OTHER): Payer: Medicare HMO | Admitting: Internal Medicine

## 2016-12-13 ENCOUNTER — Encounter: Payer: Self-pay | Admitting: Internal Medicine

## 2016-12-13 VITALS — BP 118/80 | HR 75 | Ht 61.0 in | Wt 122.0 lb

## 2016-12-13 DIAGNOSIS — J019 Acute sinusitis, unspecified: Secondary | ICD-10-CM

## 2016-12-13 MED ORDER — LEVOFLOXACIN 250 MG PO TABS: 250.0000 mg | ORAL_TABLET | Freq: Every day | ORAL | 0 refills | Status: DC

## 2016-12-13 NOTE — Progress Notes (Signed)
   Subjective:    Patient ID: Emily [name], female    DOB: 06-08-1941, 76 y.o.   MRN: [id number]  HPI   Here with 2-3 days acute onset fever, facial pain, pressure, headache, general weakness and malaise, and greenish d/c, with mild ST and cough, but pt denies chest pain, wheezing, increased sob or doe, orthopnea, PND, increased LE swelling, palpitations, dizziness or syncope. Just not better with mucinex, zyrtec, ocean mist spray and flonase.  Nothing else makes better or worse Past Medical History:  Diagnosis Date  . Actinic keratosis   . Allergic rhinitis   . BACK PAIN   . 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 Cervicalgia   . Cholelithiasis   . DIVERTICULOSIS, COLON   . ENDOMETRIOSIS   . GERD (gastroesophageal reflux disease)   . Heart palpitations   . Hypertension   . HYPOTHYROIDISM   . Pleural plaque without asbestos    CT chest 10/2010: R>L lobes, upper> lower  . Squamous cell carcinoma    History of BC   Past Surgical History:  Procedure Laterality Date  . BREAST LUMPECTOMY  2001   left  . CATARACT EXTRACTION Bilateral 09/2014   both eyes  . CHOLECYSTECTOMY  2010  . ENDOMETRIAL ABLATION    . EXCISION / BIOPSY BREAST / NIPPLE / DUCT Left 2001  . TUBAL LIGATION      reports that she has never smoked. She has never used smokeless tobacco. She reports that she does not drink alcohol or use drugs. family history includes Emphysema in her father; Heart disease in her father. Allergies  Allergen Reactions  . Levaquin [Levofloxacin] Swelling and Other (See Comments)    Hallucinations, swelling of throat  . Nexium [Esomeprazole Magnesium]     diarrhea  . Prednisone     Facial flushing   Current Outpatient Prescriptions on File Prior to Visit  Medication Sig Dispense Refill  . atenolol (TENORMIN) 25 MG tablet Take 1 tablet (25 mg total) by mouth daily. 90 tablet 3  . Calcium-Vitamin D-Vitamin K (CALCIUM + D + K)  253-664-40 MG-UNT-MCG TABS Take 1 tablet by mouth 2 (two) times daily.      Marland Kitchen levothyroxine (SYNTHROID, LEVOTHROID) 75 MCG tablet Take 1 tablet (75 mcg total) by mouth daily. 90 tablet 3   No current facility-administered medications on file prior to visit.    Review of Systems All otherwise neg per pt    Objective:   Physical Exam BP 118/80   Pulse 75   Ht 5\' 1"  (1.549 m)   Wt 122 lb (55.3 kg)   SpO2 96%   BMI 23.05 kg/m  VS noted, mild ill Constitutional: Pt appears in NAD HENT: Head: NCAT.  Right Ear: External ear normal.  Left Ear: External ear normal.  Bilat tm's with mild erythema.  Max sinus areas mild tender.  Pharynx with mild erythema, no exudate Eyes: . Pupils are equal, round, and reactive to light. Conjunctivae and EOM are normal Nose: without d/c or deformity Neck: Neck supple. Gross normal ROM Cardiovascular: Normal rate and regular rhythm.   Pulmonary/Chest: Effort normal and breath sounds without rales or wheezing.  Neurological: Pt is alert. At baseline orientation, motor grossly intact Skin: Skin is warm. No rashes, other new lesions, no LE edema Psychiatric: Pt behavior is normal without agitation  No other exam findings    Assessment & Plan:

## 2016-12-13 NOTE — Patient Instructions (Signed)
Please take all new medication as prescribed - the levaquin  Please continue all other medications as before, and refills have been done if requested.  Please have the pharmacy call with any other refills you may need  Please keep your appointments with your specialists as you may have planned   

## 2016-12-13 NOTE — Progress Notes (Signed)
Pre visit review using our clinic review tool, if applicable. No additional management support is needed unless otherwise documented below in the visit note. 

## 2016-12-14 ENCOUNTER — Encounter: Payer: Self-pay | Admitting: Family Medicine

## 2016-12-14 ENCOUNTER — Ambulatory Visit (INDEPENDENT_AMBULATORY_CARE_PROVIDER_SITE_OTHER): Payer: Medicare HMO | Admitting: Family Medicine

## 2016-12-14 ENCOUNTER — Telehealth: Payer: Self-pay

## 2016-12-14 DIAGNOSIS — J019 Acute sinusitis, unspecified: Secondary | ICD-10-CM

## 2016-12-14 DIAGNOSIS — R002 Palpitations: Secondary | ICD-10-CM | POA: Diagnosis not present

## 2016-12-14 MED ORDER — DOXYCYCLINE HYCLATE 100 MG PO TABS: 100.0000 mg | ORAL_TABLET | Freq: Two times a day (BID) | ORAL | 0 refills | Status: DC

## 2016-12-14 NOTE — Assessment & Plan Note (Signed)
Mild to mod, for antibx course,  to f/u any worsening symptoms or concerns 

## 2016-12-14 NOTE — Telephone Encounter (Signed)
Pt was seen today at [facility]

## 2016-12-14 NOTE — Assessment & Plan Note (Signed)
Bad reaction to Levaquin last night with hallucinations, anxiety and sense of her throat closing has all resolved since stopping will try Doxycycline 100 mg twice daily

## 2016-12-14 NOTE — Patient Instructions (Signed)
Allergic Rhinitis Allergic rhinitis is when the mucous membranes in the nose respond to allergens. Allergens are particles in the air that cause your body to have an allergic reaction. This causes you to release allergic antibodies. Through a chain of events, these eventually cause you to release histamine into the blood stream. Although meant to protect the body, it is this release of histamine that causes your discomfort, such as frequent sneezing, congestion, and an itchy, runny nose. What are the causes? Seasonal allergic rhinitis (hay fever) is caused by pollen allergens that may come from grasses, trees, and weeds. Year-round allergic rhinitis (perennial allergic rhinitis) is caused by allergens such as house dust mites, pet dander, and mold spores. What are the signs or symptoms?  Nasal stuffiness (congestion).  Itchy, runny nose with sneezing and tearing of the eyes. How is this diagnosed? Your health care provider can help you determine the allergen or allergens that trigger your symptoms. If you and your health care provider are unable to determine the allergen, skin or blood testing may be used. Your health care provider will diagnose your condition after taking your health history and performing a physical exam. Your health care provider may assess you for other related conditions, such as asthma, pink eye, or an ear infection. How is this treated? Allergic rhinitis does not have a cure, but it can be controlled by:  Medicines that block allergy symptoms. These may include allergy shots, nasal sprays, and oral antihistamines.  Avoiding the allergen. Hay fever may often be treated with antihistamines in pill or nasal spray forms. Antihistamines block the effects of histamine. There are over-the-counter medicines that may help with nasal congestion and swelling around the eyes. Check with your health care provider before taking or giving this medicine. If avoiding the allergen or the  medicine prescribed do not work, there are many new medicines your health care provider can prescribe. Stronger medicine may be used if initial measures are ineffective. Desensitizing injections can be used if medicine and avoidance does not work. Desensitization is when a patient is given ongoing shots until the body becomes less sensitive to the allergen. Make sure you follow up with your health care provider if problems continue. Follow these instructions at home: It is not possible to completely avoid allergens, but you can reduce your symptoms by taking steps to limit your exposure to them. It helps to know exactly what you are allergic to so that you can avoid your specific triggers. Contact a health care provider if:  You have a fever.  You develop a cough that does not stop easily (persistent).  You have shortness of breath.  You start wheezing.  Symptoms interfere with normal daily activities. This information is not intended to replace advice given to you by your health care provider. Make sure you discuss any questions you have with your health care provider. Document Released: 04/30/2001 Document Revised: 04/05/2016 Document Reviewed: 04/12/2013 Elsevier Interactive Patient Education  2017 [employer] Inc[employer]  

## 2016-12-14 NOTE — Telephone Encounter (Signed)
OK to stop levaquin and start Doxycycline 100 mg po bid x 10 days. Can be seen if she feels she needs to. Sounds like an adverse reaction to levaquin

## 2016-12-14 NOTE — Telephone Encounter (Signed)
Pt reports after taking Levaquin last night, she had 2 incidents of hallucinations and felt almost like her throat was closing. Pt has not taken meds since and Sx have gone away--- pt denies mental confusion, slurred speech etc as mentioned in team health note... Please advise

## 2016-12-15 NOTE — Progress Notes (Signed)
Subjective:    Patient ID: Emily [name], female    DOB: 10-26-1940, 76 y.o.   MRN: [id number]  Chief Complaint  Patient presents with  . Follow-up    needs new Ax because she had side effects from Levaquin    HPI Patient is in today for evaluation of possible allergic reaction. Bad reaction to Levaquin last night with hallucinations, anxiety and sense of her throat closing has all resolved since stopping. She notes all of these symptoms are gone this am but she continues to have hdad congestion, malaise, facial pressure, chills and cough. Denies CP/palp/SOB/fevers/GI or GU c/o. Taking meds as prescribed  Past Medical History:  Diagnosis Date  . Actinic keratosis   . Allergic rhinitis   . BACK PAIN   . 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 Cervicalgia   . Cholelithiasis   . DIVERTICULOSIS, COLON   . ENDOMETRIOSIS   . GERD (gastroesophageal reflux disease)   . Heart palpitations   . Hypertension   . HYPOTHYROIDISM   . Pleural plaque without asbestos    CT chest 10/2010: R>L lobes, upper> lower  . Squamous cell carcinoma    History of BC    Past Surgical History:  Procedure Laterality Date  . BREAST LUMPECTOMY  2001   left  . CATARACT EXTRACTION Bilateral 09/2014   both eyes  . CHOLECYSTECTOMY  2010  . ENDOMETRIAL ABLATION    . EXCISION / BIOPSY BREAST / NIPPLE / DUCT Left 2001  . TUBAL LIGATION      Family History  Problem Relation Age of Onset  . Heart disease Father   . Emphysema Father     smoked and worked with asbestos  . Colon cancer Neg Hx     Social History   Social History  . Marital status: Married    Spouse name: N/A  . Number of children: 2  . Years of education: N/A   Occupational History  . Retired     Chiropractor   Social History Main Topics  . Smoking status: Never Smoker  . Smokeless tobacco: Never Used  . Alcohol use No  . Drug use: No  . Sexual activity: Not on file    Other Topics Concern  . Not on file   Social History Narrative   Married, lives in [place] area since 2008 from [place] to be close to dtr    Outpatient Medications Prior to Visit  Medication Sig Dispense Refill  . atenolol (TENORMIN) 25 MG tablet Take 1 tablet (25 mg total) by mouth daily. 90 tablet 3  . Calcium-Vitamin D-Vitamin K (CALCIUM + D + K) 017-510-25 MG-UNT-MCG TABS Take 1 tablet by mouth 2 (two) times daily.      Marland Kitchen levothyroxine (SYNTHROID, LEVOTHROID) 75 MCG tablet Take 1 tablet (75 mcg total) by mouth daily. 90 tablet 3  . levofloxacin (LEVAQUIN) 250 MG tablet Take 1 tablet (250 mg total) by mouth daily. 10 tablet 0   No facility-administered medications prior to visit.     Allergies  Allergen Reactions  . Levaquin [Levofloxacin] Swelling and Other (See Comments)    Hallucinations, swelling of throat  . Nexium [Esomeprazole Magnesium]     diarrhea  . Prednisone     Facial flushing    Review of Systems  Constitutional: Positive for malaise/fatigue. Negative for fever.  HENT: Positive for congestion.   Eyes: Negative for blurred vision.  Respiratory: Positive for cough and  sputum production. Negative for shortness of breath.   Cardiovascular: Negative for chest pain, palpitations and leg swelling.  Gastrointestinal: Negative for abdominal pain, blood in stool and nausea.  Genitourinary: Negative for dysuria and frequency.  Musculoskeletal: Negative for falls.  Skin: Negative for rash.  Neurological: Negative for dizziness, loss of consciousness and headaches.  Endo/Heme/Allergies: Negative for environmental allergies.  Psychiatric/Behavioral: Positive for hallucinations. Negative for depression. The patient is nervous/anxious.        Objective:    Physical Exam  Constitutional: She is oriented to person, place, and time. She appears well-developed and well-nourished. No distress.  HENT:  Head: Normocephalic and atraumatic.  Nose: Nose normal.  Nasal  mucosa boggy and erythematous  Eyes: Right eye exhibits no discharge. Left eye exhibits no discharge.  Neck: Normal range of motion. Neck supple.  Cardiovascular: Normal rate and regular rhythm.   No murmur heard. Pulmonary/Chest: Effort normal and breath sounds normal.  Abdominal: Soft. Bowel sounds are normal. There is no tenderness.  Musculoskeletal: She exhibits no edema.  Neurological: She is alert and oriented to person, place, and time.  Skin: Skin is warm and dry.  Psychiatric: She has a normal mood and affect.  Nursing note and vitals reviewed.   BP 116/80   Pulse 71   Temp 97.7 F (36.5 C) (Oral)   Wt 123 lb 8 oz (56 kg)   SpO2 98%   BMI 23.34 kg/m  Wt Readings from Last 3 Encounters:  12/14/16 123 lb 8 oz (56 kg)  12/13/16 122 lb (55.3 kg)  11/06/16 125 lb (56.7 kg)     Lab Results  Component Value Date   WBC 7.6 11/06/2016   HGB 14.8 11/06/2016   HCT 44.2 11/06/2016   PLT 221.0 11/06/2016   GLUCOSE 82 11/06/2016   CHOL 218 (H) 11/06/2016   TRIG 251.0 (H) 11/06/2016   HDL 48.40 11/06/2016   LDLDIRECT 134.0 11/06/2016   LDLCALC 139 (H) 09/08/2015   ALT 19 11/06/2016   AST 22 11/06/2016   NA 137 11/06/2016   K 3.8 11/06/2016   CL 103 11/06/2016   CREATININE 0.95 11/06/2016   BUN 19 11/06/2016   CO2 28 11/06/2016   TSH 2.49 11/06/2016    Lab Results  Component Value Date   TSH 2.49 11/06/2016   Lab Results  Component Value Date   WBC 7.6 11/06/2016   HGB 14.8 11/06/2016   HCT 44.2 11/06/2016   MCV 88.4 11/06/2016   PLT 221.0 11/06/2016   Lab Results  Component Value Date   NA 137 11/06/2016   K 3.8 11/06/2016   CO2 28 11/06/2016   GLUCOSE 82 11/06/2016   BUN 19 11/06/2016   CREATININE 0.95 11/06/2016   BILITOT 0.4 11/06/2016   ALKPHOS 77 11/06/2016   AST 22 11/06/2016   ALT 19 11/06/2016   PROT 7.4 11/06/2016   ALBUMIN 4.3 11/06/2016   CALCIUM 10.3 11/06/2016   GFR 61.05 11/06/2016   Lab Results  Component Value Date   CHOL  218 (H) 11/06/2016   Lab Results  Component Value Date   HDL 48.40 11/06/2016   Lab Results  Component Value Date   LDLCALC 139 (H) 09/08/2015   Lab Results  Component Value Date   TRIG 251.0 (H) 11/06/2016   Lab Results  Component Value Date   CHOLHDL 5 11/06/2016   No results found for: HGBA1C     Assessment & Plan:   Problem List Items Addressed This Visit  Acute sinus infection    Bad reaction to Levaquin last night with hallucinations, anxiety and sense of her throat closing has all resolved since stopping will try Doxycycline 100 mg twice daily      Relevant Medications   doxycycline (VIBRA-TABS) 100 MG tablet   Palpitations      I have discontinued Ms. Craun's levofloxacin. I am also having her start on doxycycline. Additionally, I am having her maintain her Calcium-Vitamin D-Vitamin K, atenolol, and levothyroxine.  Meds ordered this encounter  Medications  . doxycycline (VIBRA-TABS) 100 MG tablet    Sig: Take 1 tablet (100 mg total) by mouth 2 (two) times daily.    Dispense:  20 tablet    Refill:  0     Penni Homans, MD

## 2016-12-19 DIAGNOSIS — D2230 Melanocytic nevi of unspecified part of face: Secondary | ICD-10-CM | POA: Diagnosis not present

## 2016-12-19 DIAGNOSIS — D2261 Melanocytic nevi of right upper limb, including shoulder: Secondary | ICD-10-CM | POA: Diagnosis not present

## 2016-12-19 DIAGNOSIS — D225 Melanocytic nevi of trunk: Secondary | ICD-10-CM | POA: Diagnosis not present

## 2016-12-19 DIAGNOSIS — D2272 Melanocytic nevi of left lower limb, including hip: Secondary | ICD-10-CM | POA: Diagnosis not present

## 2016-12-19 DIAGNOSIS — D239 Other benign neoplasm of skin, unspecified: Secondary | ICD-10-CM | POA: Diagnosis not present

## 2016-12-19 DIAGNOSIS — D2271 Melanocytic nevi of right lower limb, including hip: Secondary | ICD-10-CM | POA: Diagnosis not present

## 2016-12-19 DIAGNOSIS — Z85828 Personal history of other malignant neoplasm of skin: Secondary | ICD-10-CM | POA: Diagnosis not present

## 2016-12-19 DIAGNOSIS — L821 Other seborrheic keratosis: Secondary | ICD-10-CM | POA: Diagnosis not present

## 2017-01-02 DIAGNOSIS — M8580 Other specified disorders of bone density and structure, unspecified site: Secondary | ICD-10-CM | POA: Diagnosis not present

## 2017-01-02 DIAGNOSIS — S52532D Colles' fracture of left radius, subsequent encounter for closed fracture with routine healing: Secondary | ICD-10-CM | POA: Diagnosis not present

## 2017-01-03 ENCOUNTER — Other Ambulatory Visit: Payer: Self-pay | Admitting: Orthopedic Surgery

## 2017-01-03 DIAGNOSIS — M8580 Other specified disorders of bone density and structure, unspecified site: Secondary | ICD-10-CM

## 2017-01-08 DIAGNOSIS — B078 Other viral warts: Secondary | ICD-10-CM | POA: Diagnosis not present

## 2017-01-08 DIAGNOSIS — B079 Viral wart, unspecified: Secondary | ICD-10-CM | POA: Diagnosis not present

## 2017-01-31 DIAGNOSIS — M1812 Unilateral primary osteoarthritis of first carpometacarpal joint, left hand: Secondary | ICD-10-CM | POA: Diagnosis not present

## 2017-01-31 DIAGNOSIS — S52532D Colles' fracture of left radius, subsequent encounter for closed fracture with routine healing: Secondary | ICD-10-CM | POA: Diagnosis not present

## 2017-02-12 DIAGNOSIS — M25512 Pain in left shoulder: Secondary | ICD-10-CM | POA: Diagnosis not present

## 2017-02-12 DIAGNOSIS — S52532D Colles' fracture of left radius, subsequent encounter for closed fracture with routine healing: Secondary | ICD-10-CM | POA: Diagnosis not present

## 2017-02-21 DIAGNOSIS — M25512 Pain in left shoulder: Secondary | ICD-10-CM | POA: Diagnosis not present

## 2017-02-27 DIAGNOSIS — M25512 Pain in left shoulder: Secondary | ICD-10-CM | POA: Diagnosis not present

## 2017-03-12 ENCOUNTER — Telehealth: Payer: Self-pay | Admitting: *Deleted

## 2017-03-12 NOTE — Telephone Encounter (Signed)
"  Released from oncologist Dr. Benay Spice about six years ago.  In 2001 I had lumpectomy with lymph nodes removed, recall being told no B/P or shots to left arm.  Seeing orthopedic for shoulder problem.  Received cortisone injection to left arm a week and a half ago.  Starting to feel a nerve pain in the area of lymph nodes.  No swelling.  Not sure who to call so I'm starting with oncologist."  Asked if she told orthopedic about her history.  "I failed to tell them not to use the left arm.  Left arm was used for surgery recently with no problem.  The surgeon said lumpectomy was seventeen years ago so left arm is okay to use."  Will notify provider of call.  Nurse recommended notify Orthopedic provider as well.

## 2017-03-12 NOTE — Telephone Encounter (Signed)
Left message on voicemail instructing pt to breast surgeon to discuss this nerve pain and to follow up with ortho.

## 2017-03-14 DIAGNOSIS — S52532D Colles' fracture of left radius, subsequent encounter for closed fracture with routine healing: Secondary | ICD-10-CM | POA: Diagnosis not present

## 2017-06-04 ENCOUNTER — Ambulatory Visit: Payer: Medicare HMO | Admitting: Internal Medicine

## 2017-06-04 DIAGNOSIS — H35342 Macular cyst, hole, or pseudohole, left eye: Secondary | ICD-10-CM | POA: Diagnosis not present

## 2017-06-04 DIAGNOSIS — H43813 Vitreous degeneration, bilateral: Secondary | ICD-10-CM | POA: Diagnosis not present

## 2017-06-04 DIAGNOSIS — H35372 Puckering of macula, left eye: Secondary | ICD-10-CM | POA: Diagnosis not present

## 2017-06-04 DIAGNOSIS — H3581 Retinal edema: Secondary | ICD-10-CM | POA: Diagnosis not present

## 2017-06-12 ENCOUNTER — Other Ambulatory Visit: Payer: Self-pay | Admitting: Obstetrics and Gynecology

## 2017-06-12 DIAGNOSIS — Z1231 Encounter for screening mammogram for malignant neoplasm of breast: Secondary | ICD-10-CM

## 2017-06-19 ENCOUNTER — Ambulatory Visit (INDEPENDENT_AMBULATORY_CARE_PROVIDER_SITE_OTHER): Payer: Medicare HMO | Admitting: General Practice

## 2017-06-19 DIAGNOSIS — Z23 Encounter for immunization: Secondary | ICD-10-CM | POA: Diagnosis not present

## 2017-07-04 DIAGNOSIS — M795 Residual foreign body in soft tissue: Secondary | ICD-10-CM | POA: Diagnosis not present

## 2017-07-04 DIAGNOSIS — Z23 Encounter for immunization: Secondary | ICD-10-CM | POA: Diagnosis not present

## 2017-07-04 DIAGNOSIS — L853 Xerosis cutis: Secondary | ICD-10-CM | POA: Diagnosis not present

## 2017-07-06 ENCOUNTER — Encounter (HOSPITAL_COMMUNITY): Payer: Self-pay | Admitting: *Deleted

## 2017-07-06 ENCOUNTER — Ambulatory Visit (HOSPITAL_COMMUNITY)
Admission: EM | Admit: 2017-07-06 | Discharge: 2017-07-06 | Disposition: A | Discharge status: Home / Self Care | Payer: Medicare HMO | Attending: Family Medicine | Admitting: Family Medicine

## 2017-07-06 ENCOUNTER — Other Ambulatory Visit: Payer: Self-pay

## 2017-07-06 DIAGNOSIS — S61412A Laceration without foreign body of left hand, initial encounter: Secondary | ICD-10-CM | POA: Diagnosis not present

## 2017-07-06 DIAGNOSIS — Z23 Encounter for immunization: Secondary | ICD-10-CM

## 2017-07-06 DIAGNOSIS — W25XXXA Contact with sharp glass, initial encounter: Secondary | ICD-10-CM

## 2017-07-06 MED ORDER — TETANUS-DIPHTH-ACELL PERTUSSIS 5-2.5-18.5 LF-MCG/0.5 IM SUSP: 0.5000 mL | Freq: Once | INTRAMUSCULAR | Status: DC

## 2017-07-06 MED ORDER — TETANUS-DIPHTH-ACELL PERTUSSIS 5-2.5-18.5 LF-MCG/0.5 IM SUSP
INTRAMUSCULAR | Status: AC
  Filled 2017-07-06: qty 0.5

## 2017-07-06 MED ORDER — TETANUS-DIPHTH-ACELL PERTUSSIS 5-2.5-18.5 LF-MCG/0.5 IM SUSP
0.5000 mL | Freq: Once | INTRAMUSCULAR | Status: AC
  Administered 2017-07-06: 0.5 mL via INTRAMUSCULAR

## 2017-07-06 NOTE — ED Provider Notes (Signed)
[facility]    CSN: [id number] Arrival date & time: 07/06/17  1906     History   Chief Complaint Chief Complaint  Patient presents with  . Extremity Laceration    HPI Emily [name] is a 76 y.o. female.   Glass dish broke and bounced on [place] top with a fragment of glass striking the patient's left hand producing approximately 2.7 cm laceration to the ulnar edge of the hand proximal to the MCP.      Past Medical History:  Diagnosis Date  . Actinic keratosis   . Allergic rhinitis   . BACK PAIN   . 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 Cervicalgia   . Cholelithiasis   . DIVERTICULOSIS, COLON   . ENDOMETRIOSIS   . GERD (gastroesophageal reflux disease)   . Heart palpitations   . Hypertension   . HYPOTHYROIDISM   . Pleural plaque without asbestos    CT chest 10/2010: R>L lobes, upper> lower  . Squamous cell carcinoma    History of BC    Patient Active Problem List   Diagnosis Date Noted  . Memory difficulties 11/06/2016  . Left wrist fracture 11/06/2016  . IBS (irritable bowel syndrome) 04/16/2016  . Hyperlipidemia 09/10/2015  . Osteopenia 09/08/2015  . Post-nasal drip 05/10/2015  . Elevated hemoglobin ([place]) 04/26/2014  . Palpitations   . Pleural plaque without asbestos 12/03/2010  . Acute sinus infection 04/09/2010  . Cervicalgia 12/18/2009  . DIVERTICULOSIS, COLON 03/02/2009  . ENDOMETRIOSIS 03/02/2009  . DYSPLASTIC NEVUS, [place] 12/06/2008  . Lumbago 03/11/2008  . Actinic keratosis 01/23/2008  . CARCINOMA, BREAST, ESTROGEN RECEPTOR NEGATIVE 10/12/2007  . Hypothyroidism 06/18/2007  . ALLERGIC RHINITIS 06/11/2007    Past Surgical History:  Procedure Laterality Date  . BREAST LUMPECTOMY  2001   left  . CATARACT EXTRACTION Bilateral 09/2014   both eyes  . CHOLECYSTECTOMY  2010  . ENDOMETRIAL ABLATION    . EXCISION / BIOPSY BREAST / NIPPLE / DUCT Left 2001  . TUBAL LIGATION      OB  History    No data available       Home Medications    Prior to Admission medications   Medication Sig Start Date End Date Taking? Authorizing Provider  atenolol (TENORMIN) 25 MG tablet Take 1 tablet (25 mg total) by mouth daily. 11/13/16  Yes Burns, [name], MD  Calcium-Vitamin D-Vitamin K (CALCIUM + D + K) 750-500-40 MG-UNT-MCG TABS Take 1 tablet by mouth 2 (two) times daily.     Yes [provider]  levothyroxine (SYNTHROID, LEVOTHROID) 75 MCG tablet Take 1 tablet (75 mcg total) by mouth daily. 11/13/16  Yes Burns, [name], MD  mupirocin ointment (BACTROBAN) 2 % Place 1 application 2 (two) times daily into the nose.   Yes [provider]  doxycycline (VIBRA-TABS) 100 MG tablet Take 1 tablet (100 mg total) by mouth 2 (two) times daily. 12/14/16   Mosie Lukes, MD    Family History Family History  Problem Relation Age of Onset  . Heart disease Father   . Emphysema Father        smoked and worked with asbestos  . Colon cancer Neg Hx     Social History Social History   Tobacco Use  . Smoking status: Never Smoker  . Smokeless tobacco: Never Used  Substance Use Topics  . Alcohol use: No  . Drug use: No     Allergies  Levaquin [levofloxacin]; Nexium [esomeprazole magnesium]; and Prednisone   Review of Systems Review of Systems  Constitutional: Negative.   Musculoskeletal: Negative.   Skin: Positive for wound.  Neurological: Negative.   All other systems reviewed and are negative.    Physical Exam Triage Vital Signs ED Triage Vitals  Enc Vitals Group     BP 07/06/17 1919 (!) 161/86     Pulse Rate 07/06/17 1919 65     Resp 07/06/17 1919 16     Temp 07/06/17 1919 97.6 F (36.4 C)     Temp Source 07/06/17 1919 Oral     SpO2 07/06/17 1919 98 %     Weight --      Height --      Head Circumference --      Peak Flow --      Pain Score 07/06/17 1922 3     Pain Loc --      Pain Edu? --      Excl. in [place]? --    No data found.  Updated Vital  Signs BP (!) 161/86   Pulse 65   Temp 97.6 F (36.4 C) (Oral)   Resp 16   SpO2 98%   Visual Acuity Right Eye Distance:   Left Eye Distance:   Bilateral Distance:    Right Eye Near:   Left Eye Near:    Bilateral Near:     Physical Exam  Constitutional: She is oriented to person, place, and time. She appears well-developed and well-nourished. No distress.  Eyes: EOM are normal.  Neck: Normal range of motion. Neck supple.  Cardiovascular: Normal rate.  Pulmonary/Chest: Effort normal. No respiratory distress.  Musculoskeletal: Normal range of motion. She exhibits no edema.  Neurological: She is alert and oriented to person, place, and time. She exhibits normal muscle tone.  Skin: Skin is warm and dry. Capillary refill takes less than 2 seconds.  Laceration to the left hand ulnar edge just proximal to the MCP. Full range of motion of the fifth digit. Wound explored. No foreign bodies. No evidence of  tendon injury. involvement of dermis and subcutaneous tissue only. Minor bleeding.   Psychiatric: She has a normal mood and affect.  Nursing note and vitals reviewed.    UC Treatments / Results  Labs (all labs ordered are listed, but only abnormal results are displayed) Labs Reviewed - No data to display  EKG  EKG Interpretation None       Radiology No results found.  Procedures Laceration Repair Date/Time: 07/06/2017 8:32 PM Performed by: Janne Napoleon, NP Authorized by: Robyn Haber, MD   Consent:    Consent obtained:  Verbal   Consent given by:  Patient   Risks discussed:  Infection and pain Anesthesia (see MAR for exact dosages):    Anesthesia method:  Local infiltration   Local anesthetic:  Lidocaine 2% WITH epi Laceration details:    Location:  Hand   Length (cm):  2.7   Depth (mm):  4 Repair type:    Repair type:  Simple Pre-procedure details:    Preparation:  Patient was prepped and draped in usual sterile fashion Exploration:    Hemostasis  achieved with:  Direct pressure   Wound exploration: wound explored through full range of motion and entire depth of wound probed and visualized     Wound extent: no foreign bodies/material noted, no muscle damage noted, no nerve damage noted and no tendon damage noted     Contaminated: no   Treatment:  Area cleansed with:  Betadine and Shur-Clens   Amount of cleaning:  Standard   Irrigation solution:  Sterile saline   Irrigation method:  Syringe Skin repair:    Repair method:  Sutures   Suture size:  3-0   Suture material:  Nylon   Suture technique:  Simple interrupted   Number of sutures:  3 Approximation:    Approximation:  Close   [place] border: well-aligned   Post-procedure details:    Dressing:  Antibiotic ointment and sterile dressing   Patient tolerance of procedure:  Tolerated well, no immediate complications    (including critical care time)  Medications Ordered in UC Medications  Tdap (BOOSTRIX) injection 0.5 mL (0.5 mLs Intramuscular Given 07/06/17 2001)     Initial Impression / Assessment and Plan / UC Course  I have reviewed the triage vital signs and the nursing notes.  Pertinent labs & imaging results that were available during my care of the patient were reviewed by me and considered in my medical decision making (see chart for details).    Suture removal in 9-10 days. For problems signs of infection return promptly.    Final Clinical Impressions(s) / UC Diagnoses   Final diagnoses:  Laceration of left hand without foreign body, initial encounter    ED Discharge Orders    None       Controlled Substance Prescriptions [place] Controlled Substance Registry consulted? Not Applicable   Janne Napoleon, NP 07/06/17 2036

## 2017-07-06 NOTE — ED Triage Notes (Addendum)
Reports dropping a heavy dish, which broke on granite; a piece flew up, causing laceration to left hand.  C/O numbness in left 5th finger.5th finger pink with cap refill < 3 sec.  Unsure of tdap status - believes to be 10/2016, but is not confident.

## 2017-07-06 NOTE — Discharge Instructions (Addendum)
Suture removal in 9-10 days. For problems signs of infection return promptly.

## 2017-07-15 NOTE — Progress Notes (Signed)
Subjective:    Patient ID: Emily [name], female    DOB: [birth year], 76 y.o.   MRN: [id number]  HPI The patient is here for follow up from the ED.  07/06/17:  She went to the ED for a laceration of her left hand.  A glass dish and balanced on her granite top and a fragment of the glass cut her left hand.  The laceration was 2.7 cm and was on the ulnar edge of the hand proximal to the MCP.  She had full range of motion of the fifth digit.  The wound was explored and there was no foreign bodies.  There is no evidence of a tendon injury.  The laceration involves the dermis and subcutaneous tissue only there was minor bleeding.  She received a tetanus booster.  3 sutures were placed under sterile conditions.  No antibiotics were prescribed.  She was advised suture removal in 9-10 days, which is why she is here today.  She has some mild swelling and stiffness in the hand.  There is some residual bruising.  She denies pain.  There is no redness or fevers.  She has been keeping it covered.  The one end of the scar opened up a little.    Medications and allergies reviewed with patient and updated if appropriate.  Patient Active Problem List   Diagnosis Date Noted  . Memory difficulties 11/06/2016  . Left wrist fracture 11/06/2016  . IBS (irritable bowel syndrome) 04/16/2016  . Hyperlipidemia 09/10/2015  . Osteopenia 09/08/2015  . Post-nasal drip 05/10/2015  . Elevated hemoglobin ([place]) 04/26/2014  . Palpitations   . Pleural plaque without asbestos 12/03/2010  . Acute sinus infection 04/09/2010  . Cervicalgia 12/18/2009  . DIVERTICULOSIS, COLON 03/02/2009  . ENDOMETRIOSIS 03/02/2009  . DYSPLASTIC NEVUS, [place] 12/06/2008  . Lumbago 03/11/2008  . Actinic keratosis 01/23/2008  . CARCINOMA, BREAST, ESTROGEN RECEPTOR NEGATIVE 10/12/2007  . Hypothyroidism 06/18/2007  . ALLERGIC RHINITIS 06/11/2007    Current Outpatient Medications on File Prior to Visit  Medication Sig Dispense Refill  .  atenolol (TENORMIN) 25 MG tablet Take 1 tablet (25 mg total) by mouth daily. 90 tablet 3  . Calcium-Vitamin D-Vitamin K (CALCIUM + D + K) 474-259-56 MG-UNT-MCG TABS Take 1 tablet by mouth 2 (two) times daily.      Marland Kitchen levothyroxine (SYNTHROID, LEVOTHROID) 75 MCG tablet Take 1 tablet (75 mcg total) by mouth daily. 90 tablet 3  . mupirocin ointment (BACTROBAN) 2 % Place 1 application 2 (two) times daily into the nose.     No current facility-administered medications on file prior to visit.     Past Medical History:  Diagnosis Date  . Actinic keratosis   . Allergic rhinitis   . BACK PAIN   . 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 Cervicalgia   . Cholelithiasis   . DIVERTICULOSIS, COLON   . ENDOMETRIOSIS   . GERD (gastroesophageal reflux disease)   . Heart palpitations   . Hypertension   . HYPOTHYROIDISM   . Pleural plaque without asbestos    CT chest 10/2010: R>L lobes, upper> lower  . Squamous cell carcinoma    History of BC    Past Surgical History:  Procedure Laterality Date  . BREAST LUMPECTOMY  2001   left  . CATARACT EXTRACTION Bilateral 09/2014   both eyes  . CHOLECYSTECTOMY  2010  . ENDOMETRIAL ABLATION    . EXCISION / BIOPSY BREAST /  NIPPLE / DUCT Left 2001  . TUBAL LIGATION      Social History   Socioeconomic History  . Marital status: Married    Spouse name: None  . Number of children: 2  . Years of education: None  . Highest education level: None  Social Needs  . Financial resource strain: None  . Food insecurity - worry: None  . Food insecurity - inability: None  . Transportation needs - medical: None  . Transportation needs - non-medical: None  Occupational History  . Occupation: Retired    Comment: Chiropractor  Tobacco Use  . Smoking status: Never Smoker  . Smokeless tobacco: Never Used  Substance and Sexual Activity  . Alcohol use: No  . Drug use: No  . Sexual activity: None  Other Topics  Concern  . None  Social History Narrative   Married, lives in [place] area since 2008 from [place] to be close to dtr    Family History  Problem Relation Age of Onset  . Heart disease Father   . Emphysema Father        smoked and worked with asbestos  . Colon cancer Neg Hx     Review of Systems  Constitutional: Negative for fever.  Musculoskeletal:       Mild swelling left lateral hand  Skin: Positive for wound (no discharge). Negative for color change.  Neurological: Negative for weakness and numbness.       Objective:   Vitals:   07/16/17 1103  BP: 128/80  Pulse: 60  Resp: 16  Temp: 97.6 F (36.4 C)  SpO2: 95%   Wt Readings from Last 3 Encounters:  07/16/17 124 lb (56.2 kg)  12/14/16 123 lb 8 oz (56 kg)  12/13/16 122 lb (55.3 kg)   Body mass index is 23.43 kg/m.   Physical Exam  Constitutional: She appears well-developed and well-nourished. No distress.  Musculoskeletal:  FROM of hand, wrist and fingers  Neurological:  Normal sensation left hand and fingers  Skin: Skin is warm and dry. She is not diaphoretic. No erythema.  Left lateral hand - laceration healed - edge on palm side is slightly wider, no open wound or discharge, no tenderness, mild bruising       After verbal consent three sutures were removed.  She tolerated this well.      Assessment & Plan:    See Problem List for Assessment and Plan of chronic medical problems.

## 2017-07-16 ENCOUNTER — Encounter: Payer: Self-pay | Admitting: Internal Medicine

## 2017-07-16 ENCOUNTER — Ambulatory Visit: Payer: Medicare HMO | Admitting: Internal Medicine

## 2017-07-16 VITALS — BP 128/80 | HR 60 | Temp 97.6°F | Resp 16 | Wt 124.0 lb

## 2017-07-16 DIAGNOSIS — S61412D Laceration without foreign body of left hand, subsequent encounter: Secondary | ICD-10-CM | POA: Diagnosis not present

## 2017-07-16 DIAGNOSIS — S61412A Laceration without foreign body of left hand, initial encounter: Secondary | ICD-10-CM | POA: Insufficient documentation

## 2017-07-16 NOTE — Assessment & Plan Note (Signed)
Sutures removed No evidence of infection Local wound care advised Call with questions

## 2017-07-16 NOTE — Patient Instructions (Signed)
Your stitches were removed.   Continue local wound care.   Call with any questions.     Wound Care, Adult Taking care of your wound properly can help to prevent pain and infection. It can also help your wound to heal more quickly. How is this treated? Wound care  Follow instructions from your health care provider about how to take care of your wound. Make sure you: ? Wash your hands with soap and water before you change the bandage (dressing). If soap and water are not available, use hand sanitizer. ? Change your dressing as told by your health care provider. ? Leave stitches (sutures), skin glue, or adhesive strips in place. These skin closures may need to stay in place for 2 weeks or longer. If adhesive strip edges start to loosen and curl up, you may trim the loose edges. Do not remove adhesive strips completely unless your health care provider tells you to do that.  Check your wound area every day for signs of infection. Check for: ? More redness, swelling, or pain. ? More fluid or blood. ? Warmth. ? Pus or a bad smell.  Ask your health care provider if you should clean the wound with mild soap and water. Doing this may include: ? Using a clean towel to pat the wound dry after cleaning it. Do not rub or scrub the wound. ? Applying a cream or ointment. Do this only as told by your health care provider. ? Covering the incision with a clean dressing.  Ask your health care provider when you can leave the wound uncovered. Medicines   If you were prescribed an antibiotic medicine, cream, or ointment, take or use the antibiotic as told by your health care provider. Do not stop taking or using the antibiotic even if your condition improves.  Take over-the-counter and prescription medicines only as told by your health care provider. If you were prescribed pain medicine, take it at least 30 minutes before doing any wound care or as told by your health care provider. General  instructions  Return to your normal activities as told by your health care provider. Ask your health care provider what activities are safe.  Do not scratch or pick at the wound.  Keep all follow-up visits as told by your health care provider. This is important.  Eat a diet that includes protein, vitamin A, vitamin C, and other nutrient-rich foods. These help the wound heal: ? Protein-rich foods include meat, dairy, beans, nuts, and other sources. ? Vitamin A-rich foods include carrots and dark green, leafy vegetables. ? Vitamin C-rich foods include citrus, tomatoes, and other fruits and vegetables. ? Nutrient-rich foods have protein, carbohydrates, fat, vitamins, or minerals. Eat a variety of healthy foods including vegetables, fruits, and whole grains. Contact a health care provider if:  You received a tetanus shot and you have swelling, severe pain, redness, or bleeding at the injection site.  Your pain is not controlled with medicine.  You have more redness, swelling, or pain around the wound.  You have more fluid or blood coming from the wound.  Your wound feels warm to the touch.  You have pus or a bad smell coming from the wound.  You have a fever or chills.  You are nauseous or you vomit.  You are dizzy. Get help right away if:  You have a red streak going away from your wound.  The edges of the wound open up and separate.  Your wound is bleeding and  the bleeding does not stop with gentle pressure.  You have a rash.  You faint.  You have trouble breathing. This information is not intended to replace advice given to you by your health care provider. Make sure you discuss any questions you have with your health care provider. Document Released: 05/14/2008 Document Revised: 04/03/2016 Document Reviewed: 02/20/2016 Elsevier Interactive Patient Education  2017 [employer].

## 2017-07-17 ENCOUNTER — Ambulatory Visit
Admission: RE | Admit: 2017-07-17 | Discharge: 2017-07-17 | Disposition: A | Discharge status: Home / Self Care | Payer: Medicare HMO | Source: Ambulatory Visit | Attending: Orthopedic Surgery | Admitting: Orthopedic Surgery

## 2017-07-17 DIAGNOSIS — M8580 Other specified disorders of bone density and structure, unspecified site: Secondary | ICD-10-CM

## 2017-07-17 DIAGNOSIS — Z780 Asymptomatic menopausal state: Secondary | ICD-10-CM | POA: Diagnosis not present

## 2017-07-17 DIAGNOSIS — M8589 Other specified disorders of bone density and structure, multiple sites: Secondary | ICD-10-CM | POA: Diagnosis not present

## 2017-08-28 DIAGNOSIS — M25512 Pain in left shoulder: Secondary | ICD-10-CM | POA: Diagnosis not present

## 2017-09-11 NOTE — Progress Notes (Signed)
Subjective:    Patient ID: Emily [name], female    DOB: [birth year], 77 y.o.   MRN: [id number]  HPI She is here for a physical exam.    Headaches, neck pain:  She has had headaches every monring.  Her neck has been hurting.  Her neck has been hurting for the past 5-6 weeks, but 2 weeks ago got much worse.  She started experiencing pain from her head down her left arm.  She had decreased range of motion of the arm/shoulder.  She saw her orthopedic and had an injection in her shoulder and that improved those symptoms.  He told her she had a couple of tears in her shoulder that may have come from the neck pain.  No further treatment is needed.  Currently she is experiencing posterior neck and occipital pain that radiates down to the shoulder.  She wakes up with a headache, but when she is up and moving around the headaches go away.  She has tried heat and that has helped a little.  She does have stiffness in the neck.  The orthopedic prescribed Robaxin and she was taking it more frequently and it did help, but currently she is taking it once a day.  Burning or nerve pain in her left axilla.  She had lymph nodes removed from her left axilla with her breast cancer.  Ever since she injured her left wrist and had stitches in her left hand she has had a nerve pain in that area, which she knows can happen because she had the lymph nodes removed.  She was wondering why she has the intermittent nerve pain.  She denies any active issues with the arm.  She states it has gotten a little bit better since the injection in the shoulder.    Memory difficulties: Since her visit last year she feels her memory has gotten worse.  Her husband concurs.  It is her short-term memory that is the problem.  Her husband states she does ask questions over and over.  She does try to write everything down.  She does have some OCD tendencies.  She feels not remembering things causes anxiety, but her husband states that her OCD  also causes anxiety.     Medications and allergies reviewed with patient and updated if appropriate.  Patient Active Problem List   Diagnosis Date Noted  . Laceration of left hand without foreign body 07/16/2017  . Memory difficulties 11/06/2016  . Left wrist fracture 11/06/2016  . IBS (irritable bowel syndrome) 04/16/2016  . Hyperlipidemia 09/10/2015  . Osteopenia 09/08/2015  . Post-nasal drip 05/10/2015  . Elevated hemoglobin ([place]) 04/26/2014  . Palpitations   . Pleural plaque without asbestos 12/03/2010  . Cervicalgia 12/18/2009  . DIVERTICULOSIS, COLON 03/02/2009  . ENDOMETRIOSIS 03/02/2009  . DYSPLASTIC NEVUS, [place] 12/06/2008  . Lumbago 03/11/2008  . Actinic keratosis 01/23/2008  . CARCINOMA, BREAST, ESTROGEN RECEPTOR NEGATIVE 10/12/2007  . Hypothyroidism 06/18/2007  . ALLERGIC RHINITIS 06/11/2007    Current Outpatient Medications on File Prior to Visit  Medication Sig Dispense Refill  . atenolol (TENORMIN) 25 MG tablet Take 1 tablet (25 mg total) by mouth daily. 90 tablet 3  . Calcium-Vitamin D-Vitamin K (CALCIUM + D + K) 782-956-21 MG-UNT-MCG TABS Take 1 tablet by mouth 2 (two) times daily.      Marland Kitchen levothyroxine (SYNTHROID, LEVOTHROID) 75 MCG tablet Take 1 tablet (75 mcg total) by mouth daily. 90 tablet 3  . mupirocin ointment (BACTROBAN) 2 %  Place 1 application 2 (two) times daily into the nose.     No current facility-administered medications on file prior to visit.     Past Medical History:  Diagnosis Date  . Actinic keratosis   . Allergic rhinitis   . BACK PAIN   . 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 Cervicalgia   . Cholelithiasis   . DIVERTICULOSIS, COLON   . ENDOMETRIOSIS   . GERD (gastroesophageal reflux disease)   . Heart palpitations   . Hypertension   . HYPOTHYROIDISM   . Pleural plaque without asbestos    CT chest 10/2010: R>L lobes, upper> lower  . Squamous cell carcinoma    History of BC     Past Surgical History:  Procedure Laterality Date  . BREAST LUMPECTOMY  2001   left  . CATARACT EXTRACTION Bilateral 09/2014   both eyes  . CHOLECYSTECTOMY  2010  . ENDOMETRIAL ABLATION    . EXCISION / BIOPSY BREAST / NIPPLE / DUCT Left 2001  . TUBAL LIGATION      Social History   Socioeconomic History  . Marital status: Married    Spouse name: None  . Number of children: 2  . Years of education: None  . Highest education level: None  Social Needs  . Financial resource strain: None  . Food insecurity - worry: None  . Food insecurity - inability: None  . Transportation needs - medical: None  . Transportation needs - non-medical: None  Occupational History  . Occupation: Retired    Comment: Chiropractor  Tobacco Use  . Smoking status: Never Smoker  . Smokeless tobacco: Never Used  Substance and Sexual Activity  . Alcohol use: No  . Drug use: No  . Sexual activity: None  Other Topics Concern  . None  Social History Narrative   Married, lives in [place] area since 2008 from [place] to be close to dtr    Family History  Problem Relation Age of Onset  . Heart disease Father   . Emphysema Father        smoked and worked with asbestos  . Colon cancer Neg Hx     Review of Systems  Constitutional: Negative for chills and fever.  Eyes: Negative for visual disturbance.  Respiratory: Negative for cough, shortness of breath and wheezing.   Cardiovascular: Negative for chest pain, palpitations and leg swelling.  Gastrointestinal: Negative for abdominal pain, blood in stool, constipation, diarrhea and nausea.  Genitourinary: Negative for dysuria and hematuria.  Musculoskeletal: Positive for arthralgias, neck pain and neck stiffness.  Skin: Negative for color change and rash.  Neurological: Positive for headaches. Negative for dizziness and light-headedness.  Psychiatric/Behavioral: Positive for sleep disturbance (taking zyrtec). Negative for dysphoric mood. The  patient is nervous/anxious (when she has memory difficulties or when she gets confused).        Some OCD       Objective:   Vitals:   09/12/17 0913  BP: 130/84  Pulse: 60  Resp: 16  Temp: 97.6 F (36.4 C)  SpO2: 98%   Filed Weights   09/12/17 0913  Weight: 121 lb (54.9 kg)   Body mass index is 22.86 kg/m.  Wt Readings from Last 3 Encounters:  09/12/17 121 lb (54.9 kg)  07/16/17 124 lb (56.2 kg)  12/14/16 123 lb 8 oz (56 kg)     Physical Exam Constitutional: She appears well-developed and well-nourished. No distress.  HENT:  Head:  Normocephalic and atraumatic.  Right Ear: External ear normal. Normal ear canal and TM Left Ear: External ear normal.  Normal ear canal and TM Mouth/Throat: Oropharynx is clear and moist.  Eyes: Conjunctivae and EOM are normal.  Neck: Neck supple. No tracheal deviation present. No thyromegaly present.  No carotid bruit  Cardiovascular: Normal rate, regular rhythm and normal heart sounds.   No murmur heard.  No edema. Pulmonary/Chest: Effort normal and breath sounds normal. No respiratory distress. She has no wheezes. She has no rales.  Breast: deferred to Gyn Abdominal: Soft. She exhibits no distension. There is no tenderness.  Musculoskeletal: Tenderness posterior neck muscles and upper trapezius muscles, tenderness left side base of the skull Lymphadenopathy: She has no cervical adenopathy.  Skin: Skin is warm and dry. She is not diaphoretic.  Psychiatric: She has a normal mood and affect. Her behavior is normal.        Assessment & Plan:   Physical exam: Screening blood work  ordered Immunizations  Discussed shingrix, others up to date Colonoscopy   Up to date  - due 2020 Mammogram   Up to date Dexa   Up to date - due 2020 Gyn:  Up to date  Eye exams  Up to date  EKG   Last done 2015 Exercise  None - stressed regular exercise Weight  Normal BMI Skin  Sees derm -- no concerns Substance abuse   none  See Problem List for  Assessment and Plan of chronic medical problems.   Follow-up in 1 year

## 2017-09-11 NOTE — Patient Instructions (Addendum)
Test(s) ordered today. Your results will be released to [facility] (or called to you) after review, usually within 72hours after test completion. If any changes need to be made, you will be notified at that same time.  All other Health Maintenance issues reviewed.   All recommended immunizations and age-appropriate screenings are up-to-date or discussed.  No immunizations administered today.   Medications reviewed and updated.  No changes recommended at this time.   A referral was ordered for neurology  Please followup in one year   Health Maintenance, Female Adopting a healthy lifestyle and getting preventive care can go a long way to promote health and wellness. Talk with your health care provider about what schedule of regular examinations is right for you. This is a good chance for you to check in with your provider about disease prevention and staying healthy. In between checkups, there are plenty of things you can do on your own. Experts have done a lot of research about which lifestyle changes and preventive measures are most likely to keep you healthy. Ask your health care provider for more information. Weight and diet Eat a healthy diet  Be sure to include plenty of vegetables, fruits, low-fat dairy products, and lean protein.  Do not eat a lot of foods high in solid fats, added sugars, or salt.  Get regular exercise. This is one of the most important things you can do for your health. ? Most adults should exercise for at least 150 minutes each week. The exercise should increase your heart rate and make you sweat (moderate-intensity exercise). ? Most adults should also do strengthening exercises at least twice a week. This is in addition to the moderate-intensity exercise.  Maintain a healthy weight  Body mass index (BMI) is a measurement that can be used to identify possible weight problems. It estimates body fat based on height and weight. Your health care provider can help  determine your BMI and help you achieve or maintain a healthy weight.  For females 26 years of age and older: ? A BMI below 18.5 is considered underweight. ? A BMI of 18.5 to 24.9 is normal. ? A BMI of 25 to 29.9 is considered overweight. ? A BMI of 30 and above is considered obese.  Watch levels of cholesterol and blood lipids  You should start having your blood tested for lipids and cholesterol at 76 years of age, then have this test every 5 years.  You may need to have your cholesterol levels checked more often if: ? Your lipid or cholesterol levels are high. ? You are older than 76 years of age. ? You are at high risk for heart disease.  Cancer screening Lung Cancer  Lung cancer screening is recommended for adults 67-10 years old who are at high risk for lung cancer because of a history of smoking.  A yearly low-dose CT scan of the lungs is recommended for people who: ? Currently smoke. ? Have quit within the past 15 years. ? Have at least a 30-pack-year history of smoking. A pack year is smoking an average of one pack of cigarettes a day for 1 year.  Yearly screening should continue until it has been 15 years since you quit.  Yearly screening should stop if you develop a health problem that would prevent you from having lung cancer treatment.  Breast Cancer  Practice breast self-awareness. This means understanding how your breasts normally appear and feel.  It also means doing regular breast self-exams. Let your  health care provider know about any changes, no matter how small.  If you are in your 20s or 30s, you should have a clinical breast exam (CBE) by a health care provider every 1-3 years as part of a regular health exam.  If you are 42 or older, have a CBE every year. Also consider having a breast X-ray (mammogram) every year.  If you have a family history of breast cancer, talk to your health care provider about genetic screening.  If you are at high risk for  breast cancer, talk to your health care provider about having an MRI and a mammogram every year.  Breast cancer gene (BRCA) assessment is recommended for women who have family members with BRCA-related cancers. BRCA-related cancers include: ? Breast. ? Ovarian. ? Tubal. ? Peritoneal cancers.  Results of the assessment will determine the need for genetic counseling and BRCA1 and BRCA2 testing.  Cervical Cancer Your health care provider may recommend that you be screened regularly for cancer of the pelvic organs (ovaries, uterus, and vagina). This screening involves a pelvic examination, including checking for microscopic changes to the surface of your cervix (Pap test). You may be encouraged to have this screening done every 3 years, beginning at age 23.  For women ages 54-65, health care providers may recommend pelvic exams and Pap testing every 3 years, or they may recommend the Pap and pelvic exam, combined with testing for human papilloma virus (HPV), every 5 years. Some types of HPV increase your risk of cervical cancer. Testing for HPV may also be done on women of any age with unclear Pap test results.  Other health care providers may not recommend any screening for nonpregnant women who are considered low risk for pelvic cancer and who do not have symptoms. Ask your health care provider if a screening pelvic exam is right for you.  If you have had past treatment for cervical cancer or a condition that could lead to cancer, you need Pap tests and screening for cancer for at least 20 years after your treatment. If Pap tests have been discontinued, your risk factors (such as having a new sexual partner) need to be reassessed to determine if screening should resume. Some women have medical problems that increase the chance of getting cervical cancer. In these cases, your health care provider may recommend more frequent screening and Pap tests.  Colorectal Cancer  This type of cancer can be  detected and often prevented.  Routine colorectal cancer screening usually begins at 76 years of age and continues through 76 years of age.  Your health care provider may recommend screening at an earlier age if you have risk factors for colon cancer.  Your health care provider may also recommend using home test kits to check for hidden blood in the stool.  A small camera at the end of a tube can be used to examine your colon directly (sigmoidoscopy or colonoscopy). This is done to check for the earliest forms of colorectal cancer.  Routine screening usually begins at age 33.  Direct examination of the colon should be repeated every 5-10 years through 76 years of age. However, you may need to be screened more often if early forms of precancerous polyps or small growths are found.  Skin Cancer  Check your skin from head to toe regularly.  Tell your health care provider about any new moles or changes in moles, especially if there is a change in a mole's shape or color.  Also  tell your health care provider if you have a mole that is larger than the size of a pencil eraser.  Always use sunscreen. Apply sunscreen liberally and repeatedly throughout the day.  Protect yourself by wearing long sleeves, pants, a wide-brimmed hat, and sunglasses whenever you are outside.  Heart disease, diabetes, and high blood pressure  High blood pressure causes heart disease and increases the risk of stroke. High blood pressure is more likely to develop in: ? People who have blood pressure in the high end of the normal range (130-139/85-89 mm Hg). ? People who are overweight or obese. ? People who are African American.  If you are 43-71 years of age, have your blood pressure checked every 3-5 years. If you are 78 years of age or older, have your blood pressure checked every year. You should have your blood pressure measured twice-once when you are at a hospital or clinic, and once when you are not at a  hospital or clinic. Record the average of the two measurements. To check your blood pressure when you are not at a hospital or clinic, you can use: ? An automated blood pressure machine at a pharmacy. ? A home blood pressure monitor.  If you are between 72 years and 57 years old, ask your health care provider if you should take aspirin to prevent strokes.  Have regular diabetes screenings. This involves taking a blood sample to check your fasting blood sugar level. ? If you are at a normal weight and have a low risk for diabetes, have this test once every three years after 76 years of age. ? If you are overweight and have a high risk for diabetes, consider being tested at a younger age or more often. Preventing infection Hepatitis B  If you have a higher risk for hepatitis B, you should be screened for this virus. You are considered at high risk for hepatitis B if: ? You were born in a country where hepatitis B is common. Ask your health care provider which countries are considered high risk. ? Your parents were born in a high-risk country, and you have not been immunized against hepatitis B (hepatitis B vaccine). ? You have HIV or AIDS. ? You use needles to inject street drugs. ? You live with someone who has hepatitis B. ? You have had sex with someone who has hepatitis B. ? You get hemodialysis treatment. ? You take certain medicines for conditions, including cancer, organ transplantation, and autoimmune conditions.  Hepatitis C  Blood testing is recommended for: ? Everyone born from [birth year] through 1964. ? Anyone with known risk factors for hepatitis C.  Sexually transmitted infections (STIs)  You should be screened for sexually transmitted infections (STIs) including gonorrhea and chlamydia if: ? You are sexually active and are younger than 76 years of age. ? You are older than 76 years of age and your health care provider tells you that you are at risk for this type of  infection. ? Your sexual activity has changed since you were last screened and you are at an increased risk for chlamydia or gonorrhea. Ask your health care provider if you are at risk.  If you do not have HIV, but are at risk, it may be recommended that you take a prescription medicine daily to prevent HIV infection. This is called pre-exposure prophylaxis (PrEP). You are considered at risk if: ? You are sexually active and do not regularly use condoms or know the HIV status of your  partner(s). ? You take drugs by injection. ? You are sexually active with a partner who has HIV.  Talk with your health care provider about whether you are at high risk of being infected with HIV. If you choose to begin PrEP, you should first be tested for HIV. You should then be tested every 3 months for as long as you are taking PrEP. Pregnancy  If you are premenopausal and you may become pregnant, ask your health care provider about preconception counseling.  If you may become pregnant, take 400 to 800 micrograms (mcg) of folic acid every day.  If you want to prevent pregnancy, talk to your health care provider about birth control (contraception). Osteoporosis and menopause  Osteoporosis is a disease in which the bones lose minerals and strength with aging. This can result in serious bone fractures. Your risk for osteoporosis can be identified using a bone density scan.  If you are 34 years of age or older, or if you are at risk for osteoporosis and fractures, ask your health care provider if you should be screened.  Ask your health care provider whether you should take a calcium or vitamin D supplement to lower your risk for osteoporosis.  Menopause may have certain physical symptoms and risks.  Hormone replacement therapy may reduce some of these symptoms and risks. Talk to your health care provider about whether hormone replacement therapy is right for you. Follow these instructions at home:  Schedule  regular health, dental, and eye exams.  Stay current with your immunizations.  Do not use any tobacco products including cigarettes, chewing tobacco, or electronic cigarettes.  If you are pregnant, do not drink alcohol.  If you are breastfeeding, limit how much and how often you drink alcohol.  Limit alcohol intake to no more than 1 drink per day for nonpregnant women. One drink equals 12 ounces of beer, 5 ounces of wine, or 1 ounces of hard liquor.  Do not use street drugs.  Do not share needles.  Ask your health care provider for help if you need support or information about quitting drugs.  Tell your health care provider if you often feel depressed.  Tell your health care provider if you have ever been abused or do not feel safe at home. This information is not intended to replace advice given to you by your health care provider. Make sure you discuss any questions you have with your health care provider. Document Released: 02/18/2011 Document Revised: 01/11/2016 Document Reviewed: 05/09/2015 Elsevier Interactive Patient Education  [employer].

## 2017-09-12 ENCOUNTER — Other Ambulatory Visit (INDEPENDENT_AMBULATORY_CARE_PROVIDER_SITE_OTHER): Payer: Medicare HMO

## 2017-09-12 ENCOUNTER — Encounter: Payer: Self-pay | Admitting: Psychology

## 2017-09-12 ENCOUNTER — Encounter: Payer: Self-pay | Admitting: Internal Medicine

## 2017-09-12 ENCOUNTER — Ambulatory Visit (INDEPENDENT_AMBULATORY_CARE_PROVIDER_SITE_OTHER): Payer: Medicare HMO | Admitting: Internal Medicine

## 2017-09-12 VITALS — BP 130/84 | HR 60 | Temp 97.6°F | Resp 16 | Ht 61.0 in | Wt 121.0 lb

## 2017-09-12 DIAGNOSIS — R413 Other amnesia: Secondary | ICD-10-CM

## 2017-09-12 DIAGNOSIS — Z0000 Encounter for general adult medical examination without abnormal findings: Secondary | ICD-10-CM | POA: Diagnosis not present

## 2017-09-12 DIAGNOSIS — M85869 Other specified disorders of bone density and structure, unspecified lower leg: Secondary | ICD-10-CM | POA: Diagnosis not present

## 2017-09-12 DIAGNOSIS — M792 Neuralgia and neuritis, unspecified: Secondary | ICD-10-CM | POA: Insufficient documentation

## 2017-09-12 DIAGNOSIS — R002 Palpitations: Secondary | ICD-10-CM | POA: Diagnosis not present

## 2017-09-12 DIAGNOSIS — E038 Other specified hypothyroidism: Secondary | ICD-10-CM

## 2017-09-12 DIAGNOSIS — M542 Cervicalgia: Secondary | ICD-10-CM

## 2017-09-12 LAB — CBC WITH DIFFERENTIAL/PLATELET
Basophils Absolute: 0.1 10*3/uL (ref 0.0–0.1)
Basophils Relative: 0.6 % (ref 0.0–3.0)
Eosinophils Absolute: 0.2 10*3/uL (ref 0.0–0.7)
Eosinophils Relative: 1.6 % (ref 0.0–5.0)
HCT: 45.2 % (ref 36.0–46.0)
Hemoglobin: 15 g/dL (ref 12.0–15.0)
LYMPHS ABS: 3.2 10*3/uL (ref 0.7–4.0)
Lymphocytes Relative: 25.9 % (ref 12.0–46.0)
MCHC: 33.1 g/dL (ref 30.0–36.0)
MCV: 89.1 fl (ref 78.0–100.0)
MONOS PCT: 8.3 % (ref 3.0–12.0)
Monocytes Absolute: 1 10*3/uL (ref 0.1–1.0)
NEUTROS PCT: 63.6 % (ref 43.0–77.0)
Neutro Abs: 7.8 10*3/uL — ABNORMAL HIGH (ref 1.4–7.7)
Platelets: 220 10*3/uL (ref 150.0–400.0)
RBC: 5.08 Mil/uL (ref 3.87–5.11)
RDW: 12.9 % (ref 11.5–15.5)
WBC: 12.2 10*3/uL — ABNORMAL HIGH (ref 4.0–10.5)

## 2017-09-12 LAB — COMPREHENSIVE METABOLIC PANEL
ALK PHOS: 74 U/L (ref 39–117)
ALT: 14 U/L (ref 0–35)
AST: 14 U/L (ref 0–37)
Albumin: 4.3 g/dL (ref 3.5–5.2)
BUN: 27 mg/dL — ABNORMAL HIGH (ref 6–23)
CHLORIDE: 102 meq/L (ref 96–112)
CO2: 28 meq/L (ref 19–32)
Calcium: 10.1 mg/dL (ref 8.4–10.5)
Creatinine, Ser: 0.98 mg/dL (ref 0.40–1.20)
GFR: 58.76 mL/min — AB (ref >60.00)
GLUCOSE: 91 mg/dL (ref 70–99)
Potassium: 4.1 mEq/L (ref 3.5–5.1)
SODIUM: 140 meq/L (ref 135–145)
Total Bilirubin: 0.6 mg/dL (ref 0.2–1.2)
Total Protein: 7.6 g/dL (ref 6.0–8.3)

## 2017-09-12 LAB — LIPID PANEL
CHOL/HDL RATIO: 4
Cholesterol: 238 mg/dL — ABNORMAL HIGH (ref 0–200)
HDL: 56.9 mg/dL (ref >39.00)
LDL CALC: 160 mg/dL — AB (ref 0–99)
NONHDL: 181.34
Triglycerides: 106 mg/dL (ref 0.0–149.0)
VLDL: 21.2 mg/dL (ref 0.0–40.0)

## 2017-09-12 LAB — TSH: TSH: 9.34 u[IU]/mL — ABNORMAL HIGH (ref 0.35–4.50)

## 2017-09-12 LAB — VITAMIN B12: Vitamin B-12: 523 pg/mL (ref 211–911)

## 2017-09-12 NOTE — Assessment & Plan Note (Signed)
Check tsh  Titrate med dose if needed  

## 2017-09-12 NOTE — Assessment & Plan Note (Signed)
Experiencing short-term memory difficulties-worse than last year Last year her TSH and B12 level were within normal limits, will recheck She is experiencing some anxiety associated with OCD and also anxiety associated with memory difficulties Stressed the importance of regular exercise She is interested in further evaluation of her memory-we will refer to neurology

## 2017-09-12 NOTE — Assessment & Plan Note (Signed)
Bone density up-to-date Taking calcium and vitamin D Stressed the importance of regular exercise DEXA due 2020

## 2017-09-12 NOTE — Assessment & Plan Note (Signed)
Controlled with current dose of atenolol She is trying to slowly decrease her atenolol dose and would ideally like to get off of it completely

## 2017-09-12 NOTE — Assessment & Plan Note (Signed)
She has had chronic neck issues and has had imaging in the past that showed arthritis Her current pain is likely combination of arthritis and muscular in nature Needs another x-ray, may need an MRI Discussed increasing the Robaxin to see if that helps further Continue heat Discussed physical therapy-she will hold off for now She will go back to see her orthopedic

## 2017-09-12 NOTE — Assessment & Plan Note (Signed)
She is experiencing nerve pain in her left axilla She did have lymph nodes removed from here with treatment of her breast cancer She has experiencing pain degrees in the left arm, but has no active issues The pain is intermittent and does feel like nerve pain Advised her this pain may be related to her chronic shoulder pain/inflammation Can also discuss with orthopedics

## 2017-09-15 ENCOUNTER — Other Ambulatory Visit: Payer: Self-pay | Admitting: Emergency Medicine

## 2017-09-15 DIAGNOSIS — E038 Other specified hypothyroidism: Secondary | ICD-10-CM

## 2017-09-15 MED ORDER — LEVOTHYROXINE SODIUM 88 MCG PO TABS: 88.0000 ug | ORAL_TABLET | Freq: Every day | ORAL | 0 refills | Status: DC

## 2017-09-16 ENCOUNTER — Telehealth: Payer: Self-pay | Admitting: Internal Medicine

## 2017-09-16 MED ORDER — LEVOTHYROXINE SODIUM 88 MCG PO TABS: 88.0000 ug | ORAL_TABLET | Freq: Every day | ORAL | 0 refills | Status: DC

## 2017-09-16 NOTE — Telephone Encounter (Signed)
[facility]

## 2017-09-16 NOTE — Telephone Encounter (Signed)
Copied from [place]. Topic: General - Other >> Sep 16, 2017  1:48 PM Carolyn Stare wrote:  Pt call her RX was sent to her mail order and she said the doctor wants her to start on this med asap and she is asking if about 10 pills can be called in to local pharmacy till she receive her mail order   Pharmacy [facility] [place]

## 2017-09-22 DIAGNOSIS — H40013 Open angle with borderline findings, low risk, bilateral: Secondary | ICD-10-CM | POA: Diagnosis not present

## 2017-09-22 DIAGNOSIS — H35372 Puckering of macula, left eye: Secondary | ICD-10-CM | POA: Diagnosis not present

## 2017-09-23 DIAGNOSIS — M542 Cervicalgia: Secondary | ICD-10-CM | POA: Diagnosis not present

## 2017-09-24 DIAGNOSIS — Z124 Encounter for screening for malignant neoplasm of cervix: Secondary | ICD-10-CM | POA: Diagnosis not present

## 2017-09-24 DIAGNOSIS — Z01419 Encounter for gynecological examination (general) (routine) without abnormal findings: Secondary | ICD-10-CM | POA: Diagnosis not present

## 2017-09-24 DIAGNOSIS — N952 Postmenopausal atrophic vaginitis: Secondary | ICD-10-CM | POA: Diagnosis not present

## 2017-09-24 DIAGNOSIS — N941 Unspecified dyspareunia: Secondary | ICD-10-CM | POA: Diagnosis not present

## 2017-09-24 DIAGNOSIS — M79622 Pain in left upper arm: Secondary | ICD-10-CM | POA: Diagnosis not present

## 2017-09-25 ENCOUNTER — Ambulatory Visit: Payer: Medicare HMO | Admitting: Psychology

## 2017-09-25 ENCOUNTER — Encounter: Payer: Self-pay | Admitting: Psychology

## 2017-09-25 DIAGNOSIS — R413 Other amnesia: Secondary | ICD-10-CM

## 2017-09-25 DIAGNOSIS — F411 Generalized anxiety disorder: Secondary | ICD-10-CM

## 2017-09-25 NOTE — Progress Notes (Signed)
NEUROBEHAVIORAL STATUS EXAM   Name: Emily [name] Date of Birth: [birth year] Date of Interview: 09/25/2017  Reason for Referral:  Emily [name] (goes by Emily [name]) is a 77 y.o. female who is referred for neuropsychological evaluation by Dr. Billey Gosling of [facility] due to concerns about memory loss. This patient is accompanied in the office by her husband who supplements the history.  History of Presenting Problem:  Emily [name] and her husband report gradual onset and progressive worsening of memory difficulty over the past 1 1/2 years. While she has longstanding anxiety characterized by worrying and perfectionism, anxiety is significantly "heightened" of late. Cognitive complaints include forgetfulness for recent conversations, misplacing and losing items, difficulty concentrating, starting but not finishing tasks due to distractibility, word finding difficulty (per patient, not husband), and more uncertainty about directions when driving. She is not repeating herself, missing appointments, processing information more slowly, having difficulty with spelling or comprehension or verbal paraphasias.   She continues to manage all instrumental ADLs, and she and her husband stay very busy helping with their grandchildren who live locally. She has not gotten lost when driving. She does not have difficulty remembering appointments (with calendar) or medications. She manages the bills and finances with no problems. She denies any problems with cooking. Her husband states she is spending more time on her ipad, often reading or playing games.  Family history is significant for dementia in her mother (died age 69) and maternal aunt (died in her 19s).  As noted previously, the patient has a history of significant generalized anxiety and perfectionism. She has never received mental health treatment. She endorses increased anxiety as well as mildly depressed mood. She reports mild anhedonia. She  is having a hard time adjusting to her grandchildren getting older. Her husband feels she tries to "be all things too all people" and stretches herself too thin. She denies change in sleep or appetite. She does have difficulty sleeping secondary to neck pain. She gets headaches from stiff neck. She is going to be doing PT for this.  She denies any difficulty with walking or balance. She is not falling. She did fall once within the past year when she was trying to get up on a high stool, no head injury.   The patient was last seen by Dr. Quay Burow for annual visit on 09/12/2017. Labwork was completed, and per Dr. Quay Burow' notes, her cholesterol is elevated and she should be started on a cholesterol lowering medication. Liver tests, B12 level and blood counts were good. TSH was abnormal, and thyroid medication was increased to 88 mcg daily with a plan to recheck TSH in 6 weeks.   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 Marital history: Married with 2 adult daughters and 3 grandchildren, who live locally. Alcohol: None Tobacco: Never a smoker   Medical History: Past Medical History:  Diagnosis Date  . Actinic keratosis   . Allergic rhinitis   . BACK PAIN   . 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 Cervicalgia   . Cholelithiasis   . DIVERTICULOSIS, COLON   . ENDOMETRIOSIS   . GERD (gastroesophageal reflux disease)   . Heart palpitations   . Hypertension   . HYPOTHYROIDISM   . Pleural plaque without asbestos    CT chest 10/2010: R>L lobes, upper> lower  .  Squamous cell carcinoma    History of BC     Current Medications:  Outpatient Encounter Medications as of 09/25/2017  Medication Sig  . atenolol (TENORMIN) 25 MG tablet Take 1 tablet (25 mg total) by mouth daily.  . Calcium-Vitamin D-Vitamin K (CALCIUM + D + K)  750-500-40 MG-UNT-MCG TABS Take 1 tablet by mouth 2 (two) times daily.    Marland Kitchen levothyroxine (SYNTHROID, LEVOTHROID) 88 MCG tablet Take 1 tablet (88 mcg total) by mouth daily.  . mupirocin ointment (BACTROBAN) 2 % Place 1 application 2 (two) times daily into the nose.   No facility-administered encounter medications on file as of 09/25/2017.      Behavioral Observations:   Appearance: Neatly and appropriately dressed and groomed Gait: Ambulated independently, no gross abnormalities observed Speech: Fluent; normal rate, rhythm and volume. Minimal word finding difficulty. Thought process: Linear, goal directed Affect: Full, mildly anxious Interpersonal: Pleasant, appropriate   40 minutes spent face-to-face with patient completing neurobehavioral status exam. 25 minutes spent integrating medical records/clinical data and completing this report. CPT code [phone].   TESTING: There is medical necessity to proceed with neuropsychological assessment as the results will be used to aid in differential diagnosis and clinical decision-making and to inform specific treatment recommendations. Per the patient, her husband and medical records reviewed, there has been a change in cognitive functioning and a reasonable suspicion of neurocognitive disorder (rule out MCI/AD versus pseudodementia/anxiety).  Clinical Decision Making: In considering the patient's current level of functioning, level of presumed impairment, nature of symptoms, emotional and behavioral responses during the interview, level of literacy, and observed level of motivation, a battery of tests was selected and communicated to the psychometrician.    Following the clinical interview/neurobehavioral status exam, the patient completed this full battery of neuropsychological testing with my psychometrician under my supervision (see separate note).   PLAN: The patient will return to see me for a follow-up session at which time her test  performances and my impressions and treatment recommendations will be reviewed in detail.  Evaluation ongoing; full report to follow.

## 2017-09-25 NOTE — Progress Notes (Signed)
   Neuropsychology Note  Emily [name] completed 60 minutes of neuropsychological testing with technician, Milana Kidney, BS, under the supervision of Dr. Macarthur Critchley, Licensed Psychologist. The patient did not appear overtly distressed by the testing session, per behavioral observation or via self-report to the technician. Rest breaks were offered.   Clinical Decision Making: In considering the patient's current level of functioning, level of presumed impairment, nature of symptoms, emotional and behavioral responses during the interview, level of literacy, and observed level of motivation/effort, a battery of tests was selected and communicated to the psychometrician.  Communication between the psychologist and technician was ongoing throughout the testing session and changes were made as deemed necessary based on patient performance on testing, technician observations and additional pertinent factors such as those listed above.  Emily [name] will return within approximately 2 weeks for an interactive feedback session with Dr. Si Raider at which time her test performances, clinical impressions and treatment recommendations will be reviewed in detail. The patient understands she can contact our office should she require our assistance before this time.  15 minutes spent performing neuropsychological evaluation services/clinical decision making (psychologist). [CPT 44034] 60 minutes spent face-to-face with patient administering standardized tests, 30 minutes spent scoring (technician). [CPT [id number], 74259]  Full report to follow.

## 2017-09-26 ENCOUNTER — Other Ambulatory Visit: Payer: Self-pay | Admitting: Obstetrics and Gynecology

## 2017-09-26 DIAGNOSIS — M542 Cervicalgia: Secondary | ICD-10-CM | POA: Diagnosis not present

## 2017-09-28 ENCOUNTER — Other Ambulatory Visit: Payer: Self-pay | Admitting: Obstetrics and Gynecology

## 2017-09-28 DIAGNOSIS — M79622 Pain in left upper arm: Secondary | ICD-10-CM

## 2017-10-01 DIAGNOSIS — M542 Cervicalgia: Secondary | ICD-10-CM | POA: Diagnosis not present

## 2017-10-06 DIAGNOSIS — M542 Cervicalgia: Secondary | ICD-10-CM | POA: Diagnosis not present

## 2017-10-08 ENCOUNTER — Ambulatory Visit: Payer: Medicare HMO

## 2017-10-09 ENCOUNTER — Ambulatory Visit: Payer: Medicare HMO

## 2017-10-24 ENCOUNTER — Ambulatory Visit
Admission: RE | Admit: 2017-10-24 | Discharge: 2017-10-24 | Disposition: A | Discharge status: Home / Self Care | Payer: Medicare HMO | Source: Ambulatory Visit | Attending: Obstetrics and Gynecology | Admitting: Obstetrics and Gynecology

## 2017-10-24 ENCOUNTER — Ambulatory Visit: Payer: Medicare HMO

## 2017-10-24 DIAGNOSIS — M79622 Pain in left upper arm: Secondary | ICD-10-CM

## 2017-10-24 DIAGNOSIS — N644 Mastodynia: Secondary | ICD-10-CM | POA: Diagnosis not present

## 2017-10-24 DIAGNOSIS — R928 Other abnormal and inconclusive findings on diagnostic imaging of breast: Secondary | ICD-10-CM | POA: Diagnosis not present

## 2017-10-29 DIAGNOSIS — M542 Cervicalgia: Secondary | ICD-10-CM | POA: Diagnosis not present

## 2017-11-07 ENCOUNTER — Other Ambulatory Visit: Payer: Self-pay | Admitting: Emergency Medicine

## 2017-11-07 ENCOUNTER — Other Ambulatory Visit (INDEPENDENT_AMBULATORY_CARE_PROVIDER_SITE_OTHER): Payer: Medicare HMO

## 2017-11-07 DIAGNOSIS — E038 Other specified hypothyroidism: Secondary | ICD-10-CM

## 2017-11-07 DIAGNOSIS — M542 Cervicalgia: Secondary | ICD-10-CM | POA: Diagnosis not present

## 2017-11-07 LAB — TSH: TSH: 0.16 u[IU]/mL — ABNORMAL LOW (ref 0.35–4.50)

## 2017-11-09 NOTE — Progress Notes (Signed)
NEUROPSYCHOLOGICAL EVALUATION   Name:    Emily [name]  Date of Birth:   1941/05/30 Date of Interview:  09/25/2017 Date of Testing:  09/25/2017   Date of Feedback:  11/10/2017       Background Information:  Reason for Referral:  Emily [name] is a 77 y.o. female referred by Dr. Billey Gosling to assess her current level of cognitive functioning and assist in differential diagnosis. The current evaluation consisted of a review of available medical records, an interview with the patient and her husband, and the completion of a neuropsychological testing battery. Informed consent was obtained.  History of Presenting Problem:  Emily [name] and her husband report gradual onset and progressive worsening of memory difficulty over the past 1 1/2 years. While she has longstanding anxiety characterized by worrying and perfectionism, anxiety is significantly "heightened" of late. Cognitive complaints include forgetfulness for recent conversations, misplacing and losing items, difficulty concentrating, starting but not finishing tasks due to distractibility, word finding difficulty (per patient, not husband), and more uncertainty about directions when driving. She is not repeating herself, missing appointments, processing information more slowly, having difficulty with spelling or comprehension or verbal paraphasias.   She continues to manage all instrumental ADLs, and she and her husband stay very busy helping with their grandchildren who live locally. She has not gotten lost when driving. She does not have difficulty remembering appointments (with calendar) or medications. She manages the bills and finances with no problems. She denies any problems with cooking. Her husband states she is spending more time on her ipad, often reading or playing games.  Family history is significant for dementia in her mother (died age 55) and maternal aunt (died in her 8s).  As noted previously, the patient has a  history of significant generalized anxiety and perfectionism. She has never received mental health treatment. She endorses increased anxiety as well as mildly depressed mood. She reports mild anhedonia. She is having a hard time adjusting to her grandchildren getting older. Her husband feels she tries to "be all things too all people" and stretches herself too thin. She denies change in sleep or appetite. She does have difficulty sleeping secondary to neck pain. She gets headaches from stiff neck. She is going to be doing PT for this.  She denies any difficulty with walking or balance. She is not falling. She did fall once within the past year when she was trying to get up on a high stool, no head injury.   The patient was last seen by Dr. Quay Burow for annual visit on 09/12/2017. Labwork was completed, and per Dr. Quay Burow' notes, her cholesterol is elevated and she should be started on a cholesterol lowering medication. Liver tests, B12 level and blood counts were good. TSH was abnormal, and thyroid medication was increased to 88 mcg daily with a plan to recheck TSH in 6 weeks.   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 Marital history: Married with 2 adult daughters and 3 grandchildren, who live locally. Alcohol: None Tobacco: Never a smoker   Medical History:  Past Medical History:  Diagnosis Date  . Actinic keratosis   . Allergic rhinitis   . BACK PAIN   . 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 Cervicalgia   . Cholelithiasis   . DIVERTICULOSIS, COLON   . ENDOMETRIOSIS   .  GERD (gastroesophageal reflux disease)   . Heart palpitations   . Hypertension   . HYPOTHYROIDISM   . Pleural plaque without asbestos    CT chest 10/2010: R>L lobes, upper> lower  . Squamous cell carcinoma    History of BC    Current  medications:  Outpatient Encounter Medications as of 11/10/2017  Medication Sig  . atenolol (TENORMIN) 25 MG tablet Take 1 tablet (25 mg total) by mouth daily.  . Calcium-Vitamin D-Vitamin K (CALCIUM + D + K) 750-500-40 MG-UNT-MCG TABS Take 1 tablet by mouth 2 (two) times daily.    Marland Kitchen levothyroxine (SYNTHROID, LEVOTHROID) 88 MCG tablet Take 1 tablet (88 mcg total) by mouth daily.  . mupirocin ointment (BACTROBAN) 2 % Place 1 application 2 (two) times daily into the nose.   No facility-administered encounter medications on file as of 11/10/2017.      Current Examination:  Behavioral Observations:  Appearance: Neatly and appropriately dressed and groomed Gait: Ambulated independently, no gross abnormalities observed Speech: Fluent; normal rate, rhythm and volume. Minimal word finding difficulty. Thought process: Linear, goal directed Affect: Full, mildly anxious Interpersonal: Pleasant, appropriate Orientation: Oriented to all spheres. Accurately named the current President and his predecessor.   Tests Administered: . Test of Premorbid Functioning (TOPF) . Wechsler Adult Intelligence Scale-Fourth Edition (WAIS-IV): Similarities, Music therapist, Coding and Digit Span subtests . Wechsler Memory Scale-Fourth Edition (WMS-IV) Older Adult Version (ages 44-90): Logical Memory I, II and Recognition subtests  . [employer] Verbal Learning Test - 2nd Edition (CVLT-2) Short Form . Repeatable Battery for the Assessment of Neuropsychological Status (RBANS) Form A:  Figure Copy and Recall subtests and Semantic Fluency subtest . Boston Naming Test (BNT) . Boston Diagnostic Aphasia Examination: Complex Ideational Material subtest . Controlled Oral Word Association Test (COWAT) . Trail Making Test A and B . Clock drawing test . Beck Depression Inventory - 2nd Edition (BDI-II) . Generalized Anxiety Disorder - 7 item screener (GAD-7)  Test Results: Note: Standardized scores are presented only for use by  appropriately trained professionals and to allow for any future test-retest comparison. These scores should not be interpreted without consideration of all the information that is contained in the rest of the report. The most recent standardization samples from the test publisher or other sources were used whenever possible to derive standard scores; scores were corrected for age, gender, ethnicity and education when available.   Test Scores:  Test Name Raw Score Standardized Score Descriptor  TOPF 53/70 SS= 111 High average  WAIS-IV Subtests     Similarities 19/36 ss= 8 Average  Block Design 24/66 ss= 9 Average  Coding 63/135 ss= 14 Superior  Digit Span Forward 12/16 ss= 13 High average  Digit Span Backward 9/16 ss= 12 High average  WMS-IV Subtests     LM I 29/53 ss= 10 Average  LM II 20/39 ss= 11 Average  LM II Recognition 21/23 Cum %: >75 WNL  RBANS Subtests     Figure Copy 20/20 Z= 1.2 High average  Figure Recall 11/20 Z= -0.4 Average  Semantic Fluency 20/40 Z= 0 Average  CVLT-II Scores     Trial 1 4/9 Z= -1 Low average  Trial 4 7/9 Z= -0.5 Average   Trials 1-4 total 25/36 T= 47 Average  SD Free Recall 7/9 Z= 0 Average  LD Free Recall 7/9 Z= 0.5 Average  LD Cued Recall 7/9 Z= 0 Average  Recognition Discriminability 9/9 hits, 0 false positives Z= 0.5 Average  Forced Choice Recognition 9/9  WNL  BNT 53/60 T= 50 Average  BDAE Subtest     Complex Ideational Material 11/12  WNL  COWAT-FAS 35 T= 50 Average  COWAT-Animals 11 T= 37 Low average  Trail Making Test A  43" 0 errors T= 52 Average  Trail Making Test B  72" 0 errors T= 59 High average  Clock Drawing   Impaired  BDI-II 7/63  WNL  GAD-7 5/21  Mild      Description of Test Results:  Premorbid verbal intellectual abilities were estimated to have been within the high average range based on a test of word reading. Psychomotor processing speed was superior. Auditory attention and working memory were high average.  Visual-spatial construction was average to high average. Language abilities were intact. Specifically, confrontation naming was average, and semantic verbal fluency was low average to average. Auditory comprehension of complex ideational material was within normal limits. With regard to verbal memory, encoding and acquisition of non-contextual information (i.e., word list) was average. After a brief distracter task, free recall was average. After a delay, free recall was average. Cued recall was average. Performance on a yes/no recognition task was intact with 100% accuracy. On another verbal memory test, encoding and acquisition of contextual auditory information (i.e., short stories) was average. After a delay, free recall was average. Performance on a yes/no recognition task was intact. With regard to non-verbal memory, delayed free recall of visual information was average. Performances across tasks measuring various aspects of executive functioning were mostly within normal limits. Mental flexibility and set-shifting were high average on Trails B. Verbal fluency with phonemic search restrictions was average. Verbal abstract reasoning was average. Performance on a clock drawing task was impaired, with inaccurate hand placement. On a self-report measure of mood, the patient's responses were not indicative of clinically significant depression at the present time. She did endorse the following: mild anhedonia, loss of self confidence, restlessness, reduced energy, reduced sleep, concentration difficulty and fatigue. She denied suicidal ideation or intention. On a self-report measure of anxiety, the patient endorsed at least mild generalized anxiety characterized by nervousness, inability to control worry, excessive worry, difficulty relaxing, and irritability.   Clinical Impressions: Generalized anxiety disorder. No evidence of cognitive impairment.  Performances on formal cognitive testing were entirely within  normal limits and commensurate with estimated premorbid intellectual abilities. There were no areas of impairment. Based on her test results and current level of functioning, there is no evidence of cognitive impairment or disorder and no evidence of developing dementia or Alzheimer's disease at this time. The patient reports a long history of generalized anxiety and perfectionism which is increased lately. It is most likely that her cognitive complaints are secondary to reduced attention in daily life as a result of heightened generalized anxiety. I am optimistic that mental health treatment will not only reduced anxiety but also enhance cognitive functioning in daily life.    Recommendations/Plan: Based on the findings of the present evaluation, the following recommendations are offered:  1. Mental health treatment. I think she would really benefit from CBT for GAD and SSRI for anxiety (eg sertraline, citalopram, escitalopram). She is going to wait to explore these options until after her TSH/ thyroid level is addressed/stabilized. 2. Education regarding the impact of anxiety/stress on cognitive functioning was provided.  3. The patient was reassured that there are no signs of neurocognitive disorder or underlying dementia at this time. These test results will serve as a nice baseline for future comparison if ever needed.    Feedback  to Patient: Emily [name] and her husband returned for a feedback appointment on 11/10/2017 to review the results of her neuropsychological evaluation with this provider. 20 minutes face-to-face time was spent reviewing her test results, my impressions and my recommendations as detailed above.    Total time spent on this patient's case: 65 minutes for neurobehavioral status exam with psychologist (CPT code [phone]); 90 minutes of testing/scoring by psychometrician under psychologist's supervision (CPT codes [phone], [phone] units); 155 minutes for integration of patient  data, interpretation of standardized test results and clinical data, clinical decision making, treatment planning and preparation of this report, and interactive feedback with review of results to the patient/family by psychologist (CPT codes [phone], [phone] units).      Thank you for your referral of Emily [name]. Please feel free to contact me if you have any questions or concerns regarding this report.

## 2017-11-10 ENCOUNTER — Ambulatory Visit (INDEPENDENT_AMBULATORY_CARE_PROVIDER_SITE_OTHER): Payer: Medicare HMO | Admitting: Psychology

## 2017-11-10 ENCOUNTER — Encounter: Payer: Self-pay | Admitting: Psychology

## 2017-11-10 DIAGNOSIS — F411 Generalized anxiety disorder: Secondary | ICD-10-CM

## 2017-11-10 DIAGNOSIS — R413 Other amnesia: Secondary | ICD-10-CM

## 2017-11-10 NOTE — Patient Instructions (Signed)
Fortunately, results of cognitive testing were entirely within normal limits and NOT indicative of a neurocognitive disorder or developing dementia.    It is most likely the case that your cognitive symptoms in daily life are due to anxiety.   Below is some more information on this.  I am hopeful that treatment of anxiety (e.g.  Medication and/or psychotherapy) will not only improve your mood and coping, but also result in improved cognitive functioning in your daily life.    The effect of depression and anxiety on your cognitive functioning: . One of the typical symptoms of depression is difficulty concentrating and making decisions, and various types of anxiety also interfere with attention and concentration . Problems with attention and concentration can disrupt the process of learning and making new memories, which can make it seem like there is a problem with your memory. In your daily life, you may experience this disruption as forgetting names and appointments, misplacing items, and needing to make lists for shopping and errands. It may be harder for you to stay focused on tasks and feel as "sharp" as you did in the past.  . Also, when we are depressed or anxious, we often pay more attention to our difficulties (rather than our strengths) in our daily life, and this can make it seem to [place] like we are doing worse cognitively than we really are. . The cognitive aspects of depression and anxiety are sometimes observed as an identifiable pattern of poor performance on a neuropsychological evaluation, but it is also possible that all scores on an evaluation are within normal limits. . Regardless of the test scores, distress related to depression and anxiety can interfere with the ability to make use of your cognitive resources and function optimally across settings such as work or school, maintaining the home and responsibilities, and personal relationships. . Fortunately, there are treatments for  depression and anxiety, and when mood improves, cognitive functioning in daily life often improves. . Treatment options include psychotherapy, medications (e.g., antidepressants), and behavioral changes, such as increasing your involvement in enjoyable activities, increasing the amount of exercise you are getting, and maintaining a regular routine.

## 2017-12-11 ENCOUNTER — Telehealth: Payer: Self-pay | Admitting: Emergency Medicine

## 2017-12-11 NOTE — Telephone Encounter (Signed)
Called patient to schedule AWV. Patient stated she just saw Dr Quay Burow and declined at this time.

## 2017-12-18 DIAGNOSIS — M25512 Pain in left shoulder: Secondary | ICD-10-CM | POA: Diagnosis not present

## 2017-12-18 DIAGNOSIS — M542 Cervicalgia: Secondary | ICD-10-CM | POA: Diagnosis not present

## 2018-01-01 ENCOUNTER — Other Ambulatory Visit (INDEPENDENT_AMBULATORY_CARE_PROVIDER_SITE_OTHER): Payer: Medicare HMO

## 2018-01-01 DIAGNOSIS — D2230 Melanocytic nevi of unspecified part of face: Secondary | ICD-10-CM | POA: Diagnosis not present

## 2018-01-01 DIAGNOSIS — D2272 Melanocytic nevi of left lower limb, including hip: Secondary | ICD-10-CM | POA: Diagnosis not present

## 2018-01-01 DIAGNOSIS — E038 Other specified hypothyroidism: Secondary | ICD-10-CM | POA: Diagnosis not present

## 2018-01-01 DIAGNOSIS — D2271 Melanocytic nevi of right lower limb, including hip: Secondary | ICD-10-CM | POA: Diagnosis not present

## 2018-01-01 DIAGNOSIS — D2261 Melanocytic nevi of right upper limb, including shoulder: Secondary | ICD-10-CM | POA: Diagnosis not present

## 2018-01-01 DIAGNOSIS — D225 Melanocytic nevi of trunk: Secondary | ICD-10-CM | POA: Diagnosis not present

## 2018-01-01 DIAGNOSIS — L821 Other seborrheic keratosis: Secondary | ICD-10-CM | POA: Diagnosis not present

## 2018-01-01 DIAGNOSIS — Z85828 Personal history of other malignant neoplasm of skin: Secondary | ICD-10-CM | POA: Diagnosis not present

## 2018-01-01 LAB — TSH: TSH: 0.35 u[IU]/mL (ref 0.35–4.50)

## 2018-01-02 ENCOUNTER — Encounter: Payer: Self-pay | Admitting: Internal Medicine

## 2018-01-02 DIAGNOSIS — E038 Other specified hypothyroidism: Secondary | ICD-10-CM

## 2018-01-03 DIAGNOSIS — M542 Cervicalgia: Secondary | ICD-10-CM | POA: Diagnosis not present

## 2018-01-13 DIAGNOSIS — M4802 Spinal stenosis, cervical region: Secondary | ICD-10-CM | POA: Diagnosis not present

## 2018-01-15 DIAGNOSIS — M5030 Other cervical disc degeneration, unspecified cervical region: Secondary | ICD-10-CM | POA: Insufficient documentation

## 2018-01-29 DIAGNOSIS — M5030 Other cervical disc degeneration, unspecified cervical region: Secondary | ICD-10-CM | POA: Diagnosis not present

## 2018-02-03 DIAGNOSIS — M5030 Other cervical disc degeneration, unspecified cervical region: Secondary | ICD-10-CM | POA: Diagnosis not present

## 2018-02-03 DIAGNOSIS — M25512 Pain in left shoulder: Secondary | ICD-10-CM | POA: Diagnosis not present

## 2018-03-10 ENCOUNTER — Other Ambulatory Visit (INDEPENDENT_AMBULATORY_CARE_PROVIDER_SITE_OTHER): Payer: Medicare HMO

## 2018-03-10 ENCOUNTER — Encounter: Payer: Self-pay | Admitting: Internal Medicine

## 2018-03-10 DIAGNOSIS — E038 Other specified hypothyroidism: Secondary | ICD-10-CM

## 2018-03-10 LAB — TSH: TSH: 3.44 u[IU]/mL (ref 0.35–4.50)

## 2018-03-10 MED ORDER — LEVOTHYROXINE SODIUM 75 MCG PO TABS: 75.0000 ug | ORAL_TABLET | Freq: Every day | ORAL | 0 refills | Status: DC

## 2018-03-11 ENCOUNTER — Encounter: Payer: Self-pay | Admitting: Internal Medicine

## 2018-03-16 DIAGNOSIS — Q72812 Congenital shortening of left lower limb: Secondary | ICD-10-CM | POA: Diagnosis not present

## 2018-03-16 DIAGNOSIS — M9905 Segmental and somatic dysfunction of pelvic region: Secondary | ICD-10-CM | POA: Diagnosis not present

## 2018-03-16 DIAGNOSIS — M9901 Segmental and somatic dysfunction of cervical region: Secondary | ICD-10-CM | POA: Diagnosis not present

## 2018-03-16 DIAGNOSIS — M50122 Cervical disc disorder at C5-C6 level with radiculopathy: Secondary | ICD-10-CM | POA: Diagnosis not present

## 2018-03-17 DIAGNOSIS — M9901 Segmental and somatic dysfunction of cervical region: Secondary | ICD-10-CM | POA: Diagnosis not present

## 2018-03-17 DIAGNOSIS — Q72812 Congenital shortening of left lower limb: Secondary | ICD-10-CM | POA: Diagnosis not present

## 2018-03-17 DIAGNOSIS — M9905 Segmental and somatic dysfunction of pelvic region: Secondary | ICD-10-CM | POA: Diagnosis not present

## 2018-03-17 DIAGNOSIS — M50122 Cervical disc disorder at C5-C6 level with radiculopathy: Secondary | ICD-10-CM | POA: Diagnosis not present

## 2018-03-19 DIAGNOSIS — M9905 Segmental and somatic dysfunction of pelvic region: Secondary | ICD-10-CM | POA: Diagnosis not present

## 2018-03-19 DIAGNOSIS — M9901 Segmental and somatic dysfunction of cervical region: Secondary | ICD-10-CM | POA: Diagnosis not present

## 2018-03-19 DIAGNOSIS — Q72812 Congenital shortening of left lower limb: Secondary | ICD-10-CM | POA: Diagnosis not present

## 2018-03-19 DIAGNOSIS — M50122 Cervical disc disorder at C5-C6 level with radiculopathy: Secondary | ICD-10-CM | POA: Diagnosis not present

## 2018-03-23 DIAGNOSIS — Q72812 Congenital shortening of left lower limb: Secondary | ICD-10-CM | POA: Diagnosis not present

## 2018-03-23 DIAGNOSIS — M9901 Segmental and somatic dysfunction of cervical region: Secondary | ICD-10-CM | POA: Diagnosis not present

## 2018-03-23 DIAGNOSIS — M9905 Segmental and somatic dysfunction of pelvic region: Secondary | ICD-10-CM | POA: Diagnosis not present

## 2018-03-23 DIAGNOSIS — M50122 Cervical disc disorder at C5-C6 level with radiculopathy: Secondary | ICD-10-CM | POA: Diagnosis not present

## 2018-03-24 DIAGNOSIS — M9901 Segmental and somatic dysfunction of cervical region: Secondary | ICD-10-CM | POA: Diagnosis not present

## 2018-03-24 DIAGNOSIS — M9905 Segmental and somatic dysfunction of pelvic region: Secondary | ICD-10-CM | POA: Diagnosis not present

## 2018-03-24 DIAGNOSIS — M50122 Cervical disc disorder at C5-C6 level with radiculopathy: Secondary | ICD-10-CM | POA: Diagnosis not present

## 2018-03-24 DIAGNOSIS — Q72812 Congenital shortening of left lower limb: Secondary | ICD-10-CM | POA: Diagnosis not present

## 2018-03-30 DIAGNOSIS — M9901 Segmental and somatic dysfunction of cervical region: Secondary | ICD-10-CM | POA: Diagnosis not present

## 2018-03-30 DIAGNOSIS — M9905 Segmental and somatic dysfunction of pelvic region: Secondary | ICD-10-CM | POA: Diagnosis not present

## 2018-03-30 DIAGNOSIS — Q72812 Congenital shortening of left lower limb: Secondary | ICD-10-CM | POA: Diagnosis not present

## 2018-03-30 DIAGNOSIS — M50122 Cervical disc disorder at C5-C6 level with radiculopathy: Secondary | ICD-10-CM | POA: Diagnosis not present

## 2018-03-31 DIAGNOSIS — M9905 Segmental and somatic dysfunction of pelvic region: Secondary | ICD-10-CM | POA: Diagnosis not present

## 2018-03-31 DIAGNOSIS — Q72812 Congenital shortening of left lower limb: Secondary | ICD-10-CM | POA: Diagnosis not present

## 2018-03-31 DIAGNOSIS — M9901 Segmental and somatic dysfunction of cervical region: Secondary | ICD-10-CM | POA: Diagnosis not present

## 2018-03-31 DIAGNOSIS — M50122 Cervical disc disorder at C5-C6 level with radiculopathy: Secondary | ICD-10-CM | POA: Diagnosis not present

## 2018-04-02 DIAGNOSIS — M9901 Segmental and somatic dysfunction of cervical region: Secondary | ICD-10-CM | POA: Diagnosis not present

## 2018-04-02 DIAGNOSIS — M9905 Segmental and somatic dysfunction of pelvic region: Secondary | ICD-10-CM | POA: Diagnosis not present

## 2018-04-02 DIAGNOSIS — M50122 Cervical disc disorder at C5-C6 level with radiculopathy: Secondary | ICD-10-CM | POA: Diagnosis not present

## 2018-04-02 DIAGNOSIS — Q72812 Congenital shortening of left lower limb: Secondary | ICD-10-CM | POA: Diagnosis not present

## 2018-04-06 DIAGNOSIS — M9901 Segmental and somatic dysfunction of cervical region: Secondary | ICD-10-CM | POA: Diagnosis not present

## 2018-04-06 DIAGNOSIS — Q72812 Congenital shortening of left lower limb: Secondary | ICD-10-CM | POA: Diagnosis not present

## 2018-04-06 DIAGNOSIS — M50122 Cervical disc disorder at C5-C6 level with radiculopathy: Secondary | ICD-10-CM | POA: Diagnosis not present

## 2018-04-06 DIAGNOSIS — M9905 Segmental and somatic dysfunction of pelvic region: Secondary | ICD-10-CM | POA: Diagnosis not present

## 2018-04-07 ENCOUNTER — Encounter: Payer: Self-pay | Admitting: Internal Medicine

## 2018-04-07 MED ORDER — ATENOLOL 25 MG PO TABS: 25.0000 mg | ORAL_TABLET | Freq: Every day | ORAL | 1 refills | Status: DC

## 2018-04-07 MED ORDER — LEVOTHYROXINE SODIUM 75 MCG PO TABS: 75.0000 ug | ORAL_TABLET | Freq: Every day | ORAL | 1 refills | Status: DC

## 2018-04-09 DIAGNOSIS — Q72812 Congenital shortening of left lower limb: Secondary | ICD-10-CM | POA: Diagnosis not present

## 2018-04-09 DIAGNOSIS — M9901 Segmental and somatic dysfunction of cervical region: Secondary | ICD-10-CM | POA: Diagnosis not present

## 2018-04-09 DIAGNOSIS — M9905 Segmental and somatic dysfunction of pelvic region: Secondary | ICD-10-CM | POA: Diagnosis not present

## 2018-04-09 DIAGNOSIS — M50122 Cervical disc disorder at C5-C6 level with radiculopathy: Secondary | ICD-10-CM | POA: Diagnosis not present

## 2018-04-14 DIAGNOSIS — M9901 Segmental and somatic dysfunction of cervical region: Secondary | ICD-10-CM | POA: Diagnosis not present

## 2018-04-14 DIAGNOSIS — M9905 Segmental and somatic dysfunction of pelvic region: Secondary | ICD-10-CM | POA: Diagnosis not present

## 2018-04-14 DIAGNOSIS — Q72812 Congenital shortening of left lower limb: Secondary | ICD-10-CM | POA: Diagnosis not present

## 2018-04-14 DIAGNOSIS — M50122 Cervical disc disorder at C5-C6 level with radiculopathy: Secondary | ICD-10-CM | POA: Diagnosis not present

## 2018-04-21 DIAGNOSIS — M5030 Other cervical disc degeneration, unspecified cervical region: Secondary | ICD-10-CM | POA: Diagnosis not present

## 2018-04-21 DIAGNOSIS — M7542 Impingement syndrome of left shoulder: Secondary | ICD-10-CM | POA: Diagnosis not present

## 2018-04-21 DIAGNOSIS — M19012 Primary osteoarthritis, left shoulder: Secondary | ICD-10-CM | POA: Diagnosis not present

## 2018-04-21 DIAGNOSIS — M25512 Pain in left shoulder: Secondary | ICD-10-CM | POA: Diagnosis not present

## 2018-05-07 DIAGNOSIS — M50122 Cervical disc disorder at C5-C6 level with radiculopathy: Secondary | ICD-10-CM | POA: Diagnosis not present

## 2018-05-07 DIAGNOSIS — M9901 Segmental and somatic dysfunction of cervical region: Secondary | ICD-10-CM | POA: Diagnosis not present

## 2018-05-07 DIAGNOSIS — Q72812 Congenital shortening of left lower limb: Secondary | ICD-10-CM | POA: Diagnosis not present

## 2018-05-07 DIAGNOSIS — M9905 Segmental and somatic dysfunction of pelvic region: Secondary | ICD-10-CM | POA: Diagnosis not present

## 2018-05-12 DIAGNOSIS — M47812 Spondylosis without myelopathy or radiculopathy, cervical region: Secondary | ICD-10-CM | POA: Diagnosis not present

## 2018-05-12 DIAGNOSIS — M47816 Spondylosis without myelopathy or radiculopathy, lumbar region: Secondary | ICD-10-CM | POA: Diagnosis not present

## 2018-05-13 DIAGNOSIS — M9905 Segmental and somatic dysfunction of pelvic region: Secondary | ICD-10-CM | POA: Diagnosis not present

## 2018-05-13 DIAGNOSIS — M9901 Segmental and somatic dysfunction of cervical region: Secondary | ICD-10-CM | POA: Diagnosis not present

## 2018-05-13 DIAGNOSIS — Q72812 Congenital shortening of left lower limb: Secondary | ICD-10-CM | POA: Diagnosis not present

## 2018-05-13 DIAGNOSIS — M50122 Cervical disc disorder at C5-C6 level with radiculopathy: Secondary | ICD-10-CM | POA: Diagnosis not present
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
## 2018-05-19 ENCOUNTER — Other Ambulatory Visit (INDEPENDENT_AMBULATORY_CARE_PROVIDER_SITE_OTHER): Payer: Medicare HMO

## 2018-05-19 ENCOUNTER — Encounter: Payer: Self-pay | Admitting: Internal Medicine

## 2018-05-19 ENCOUNTER — Ambulatory Visit (INDEPENDENT_AMBULATORY_CARE_PROVIDER_SITE_OTHER): Payer: Medicare HMO | Admitting: Internal Medicine

## 2018-05-19 VITALS — BP 142/84 | HR 68 | Temp 98.2°F | Resp 16 | Ht 61.0 in | Wt 120.8 lb

## 2018-05-19 DIAGNOSIS — R55 Syncope and collapse: Secondary | ICD-10-CM | POA: Insufficient documentation

## 2018-05-19 DIAGNOSIS — R002 Palpitations: Secondary | ICD-10-CM | POA: Diagnosis not present

## 2018-05-19 LAB — COMPREHENSIVE METABOLIC PANEL
ALBUMIN: 4.2 g/dL (ref 3.5–5.2)
ALK PHOS: 68 U/L (ref 39–117)
ALT: 13 U/L (ref 0–35)
AST: 14 U/L (ref 0–37)
BILIRUBIN TOTAL: 0.4 mg/dL (ref 0.2–1.2)
BUN: 18 mg/dL (ref 6–23)
CO2: 28 mEq/L (ref 19–32)
CREATININE: 0.99 mg/dL (ref 0.40–1.20)
Calcium: 9.9 mg/dL (ref 8.4–10.5)
Chloride: 105 mEq/L (ref 96–112)
GFR: 57.97 mL/min — ABNORMAL LOW (ref >60.00)
GLUCOSE: 91 mg/dL (ref 70–99)
Potassium: 3.8 mEq/L (ref 3.5–5.1)
SODIUM: 140 meq/L (ref 135–145)
TOTAL PROTEIN: 7.1 g/dL (ref 6.0–8.3)

## 2018-05-19 LAB — CBC WITH DIFFERENTIAL/PLATELET
BASOS ABS: 0 10*3/uL (ref 0.0–0.1)
Basophils Relative: 0.2 % (ref 0.0–3.0)
EOS ABS: 0.1 10*3/uL (ref 0.0–0.7)
EOS PCT: 1.2 % (ref 0.0–5.0)
HCT: 43.4 % (ref 36.0–46.0)
HEMOGLOBIN: 14.7 g/dL (ref 12.0–15.0)
LYMPHS ABS: 2.5 10*3/uL (ref 0.7–4.0)
Lymphocytes Relative: 25.9 % (ref 12.0–46.0)
MCHC: 33.9 g/dL (ref 30.0–36.0)
MCV: 89.5 fl (ref 78.0–100.0)
MONO ABS: 0.9 10*3/uL (ref 0.1–1.0)
Monocytes Relative: 9.4 % (ref 3.0–12.0)
NEUTROS PCT: 63.3 % (ref 43.0–77.0)
Neutro Abs: 6.2 10*3/uL (ref 1.4–7.7)
Platelets: 222 10*3/uL (ref 150.0–400.0)
RBC: 4.85 Mil/uL (ref 3.87–5.11)
RDW: 13.6 % (ref 11.5–15.5)
WBC: 9.8 10*3/uL (ref 4.0–10.5)

## 2018-05-19 LAB — TSH: TSH: 10.84 u[IU]/mL — AB (ref 0.35–4.50)

## 2018-05-19 NOTE — Patient Instructions (Addendum)
  Tests ordered today. Your results will be released to [place] (or called to you) after review, usually within 72hours after test completion. If any changes need to be made, you will be notified at that same time.   Medications reviewed and updated.  Changes include :   none  An EKG was done today.  An Echo or ultrasound of your heart was ordered - someone will call you to schedule this.     A referral was ordered for cardiology.

## 2018-05-19 NOTE — Assessment & Plan Note (Addendum)
10 days ago had a episode of feeling like her heart paused associated with lightheadedness/nausea and feeling like she was going to pass out - lasted 10 sec Has had some lightheadedness and nausea since Was taking zyrtec D and Tylenol Pm at that time and she has stopped them Check tsh, cbc, cmp EKG today shows sinus bradycardia at 56 bpm, first-degree AV block, poor R wave progression, possible right atrial abnormality.  No significant change from prior EKG 2015 Echo ordered Referred to cardiology

## 2018-05-19 NOTE — Assessment & Plan Note (Signed)
Long term palpitations, placed on atenolol years ago, which has controlled the palpitations Last year she did reduce the dose to 25 mg alternating with 12.5 mg daily -- no increase in palpitations Denies any recent palpitations Continue current dose of atenolol until full cardiac evaluation is done

## 2018-05-20 ENCOUNTER — Ambulatory Visit: Payer: Medicare HMO | Admitting: Cardiology

## 2018-05-20 ENCOUNTER — Encounter: Payer: Self-pay | Admitting: Cardiology

## 2018-05-20 ENCOUNTER — Other Ambulatory Visit: Payer: Self-pay | Admitting: Emergency Medicine

## 2018-05-20 VITALS — BP 128/66 | HR 79 | Ht 61.0 in | Wt 120.1 lb

## 2018-05-20 DIAGNOSIS — R011 Cardiac murmur, unspecified: Secondary | ICD-10-CM

## 2018-05-20 DIAGNOSIS — R55 Syncope and collapse: Secondary | ICD-10-CM | POA: Diagnosis not present

## 2018-05-20 DIAGNOSIS — R002 Palpitations: Secondary | ICD-10-CM

## 2018-05-20 DIAGNOSIS — E038 Other specified hypothyroidism: Secondary | ICD-10-CM

## 2018-05-20 MED ORDER — LEVOTHYROXINE SODIUM 88 MCG PO TABS: 88.0000 ug | ORAL_TABLET | Freq: Every day | ORAL | 0 refills | Status: DC

## 2018-05-20 NOTE — Progress Notes (Signed)
Cardiology Office Note:    Date:  05/21/2018   ID:  DEBBE CRUMBLE, DOB 1940/11/16, MRN [id number]  PCP:  Binnie Rail, MD  Cardiologist:  Buford Dresser, MD PhD  Referring MD: Binnie Rail, MD   CC: near syncope, sensation that her heart stopped.  History of Present Illness:    Emily [name] is a 77 y.o. female with a hx of palpitations, mitral valve prolapse who is seen as a new consult at the request of Burns, [name], MD for the evaluation and management of presyncope and palpitations. She was seen by Dr. Quay Burow yesterday for these symptoms. Echo was ordered, and she was referred to cardiology for further evaluation.  She had an event between 1-2 weeks ago when she was driving and felt like her heart had a strong prolonged beat. Associated with presyncope, diaphoresis. No full LOC. Has never happened before. Has a history of prior palpitations (she also notes a history of mitral valve prolapse), but her symptoms have been well controlled on atenolol. She has been gradually decreasing the dose.   Since that time, has had near constant nausea and lightheadedness, thought she thinks they have been getting better. She did have one additional episode of a strong beat last night, but it wasn't as strong as prior.   Many years ago was told she had mitral valve prolapse. Palpitations have been since her mids 40s.   No recent infections. Has been going to chiropractor for laser treatments, has also been seeing orthopedic doctor  Past Medical History:  Diagnosis Date  . Actinic keratosis   . Allergic rhinitis   . BACK PAIN   . 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 Cervicalgia   . Cholelithiasis   . DIVERTICULOSIS, COLON   . ENDOMETRIOSIS   . GERD (gastroesophageal reflux disease)   . Heart palpitations   . Hypertension   . HYPOTHYROIDISM   . Pleural plaque without asbestos    CT chest 10/2010: R>L lobes, upper> lower  .  Squamous cell carcinoma    History of BC    Past Surgical History:  Procedure Laterality Date  . BREAST LUMPECTOMY  2001   left  . CATARACT EXTRACTION Bilateral 09/2014   both eyes  . CHOLECYSTECTOMY  2010  . ENDOMETRIAL ABLATION    . EXCISION / BIOPSY BREAST / NIPPLE / DUCT Left 2001  . TUBAL LIGATION      Current Medications: Current Outpatient Medications on File Prior to Visit  Medication Sig  . atenolol (TENORMIN) 25 MG tablet Take 1 tablet (25 mg total) by mouth daily. Annual appt due in January must see provider for future refills  . Calcium-Vitamin D-Vitamin K (CALCIUM + D + K) [phone] MG-UNT-MCG TABS Take 1 tablet by mouth 2 (two) times daily.     No current facility-administered medications on file prior to visit.      Allergies:   Levaquin [levofloxacin]; Nexium [esomeprazole magnesium]; and Prednisone    Family History: The patient's family history includes Emphysema in her father; Heart disease in her father. There is no history of Colon cancer.  ROS:   Please see the history of present illness.  Additional pertinent ROS:  Constitutional: Negative for chills, fever, night sweats, unintentional weight loss  HENT: Negative for ear pain and hearing loss.   Eyes: Negative for loss of vision and eye pain.  Respiratory: Negative for cough, sputum, shortness of breath, wheezing.  Cardiovascular: Positive for strong feeling heart beat, presyncope. Negative for chest pain, her prior palpitations , PND, orthopnea, lower extremity edema and claudication.  Gastrointestinal: Negative for abdominal pain, melena, and hematochezia.  Genitourinary: Negative for dysuria and hematuria.  Musculoskeletal: Negative for falls and myalgias.  Skin: Negative for itching and rash.  Neurological: Negative for focal weakness, focal sensory changes and loss of consciousness.  Endo/Heme/Allergies: Does not bruise/bleed easily.    EKGs/Labs/Other Studies Reviewed:    The following  studies were reviewed today: Notes reviewed. No prior cardiac workup available.  EKG:  EKG is ordered today.  The ekg ordered today demonstrates normal sinus rhythm, biatrial enlargement, Q waves in III/aVF and poor R wave progression.  Recent Labs: 05/19/2018: ALT 13; BUN 18; Creatinine, Ser 0.99; Hemoglobin 14.7; Platelets 222.0; Potassium 3.8; Sodium 140; TSH 10.84  Recent Lipid Panel    Component Value Date/Time   CHOL 238 (H) 09/12/2017 1018   TRIG 106.0 09/12/2017 1018   TRIG 78 06/09/2006 0945   HDL 56.90 09/12/2017 1018   CHOLHDL 4 09/12/2017 1018   VLDL 21.2 09/12/2017 1018   LDLCALC 160 (H) 09/12/2017 1018   LDLDIRECT 134.0 11/06/2016 1159    Physical Exam:    VS:  BP 128/66   Pulse 79   Ht 5\' 1"  (1.549 m)   Wt 120 lb 1.3 oz (54.5 kg)   BMI 22.69 kg/m     Wt Readings from Last 3 Encounters:  05/20/18 120 lb 1.3 oz (54.5 kg)  05/19/18 120 lb 12.8 oz (54.8 kg)  09/12/17 121 lb (54.9 kg)     GEN: Well nourished, well developed in no acute distress HEENT: Normal NECK: No JVD; No carotid bruits LYMPHATICS: No lymphadenopathy CARDIAC: regular rhythm, normal S1 and S2, no rubs, gallops. 1/6 systolic murmur heard at sternal border. Radial and DP pulses 2+ bilaterally. RESPIRATORY:  Clear to auscultation without rales, wheezing or rhonchi  ABDOMEN: Soft, non-tender, non-distended MUSCULOSKELETAL:  No edema; No deformity  SKIN: Warm and dry NEUROLOGIC:  Alert and oriented x 3 PSYCHIATRIC:  Normal affect   ASSESSMENT:    1. Palpitation   2. Pre-syncope   3. Murmur, cardiac    PLAN:    1. Presyncope, palpitations: discussed possible etiologies at length today, as she has many concerns. Her husband is with her today and has an extensive heart history, and so >30 mins of time was spent on counseling alone regarding indications for testing, symptoms/etiologies of arrhythma, symptoms/etiology of angina, utility of multiple types of cardiac testing. Given her history of  unknown valve etiology, will await results of echocardiogram as well.  -echo already ordered  -14 day monitor to evaluate for arrythmia.  -TSH was abnormal (10). Having her levothyroxine adjusted, plan is for 6 week recheck  -counseled on red flag symptoms that require immediate medical attention.  Plan for follow up: TBD based on results of testing.  TIME SPENT WITH PATIENT: 50 minutes of direct patient care. More than 50% of that time was spent on coordination of care and counseling regarding testing indications, arrhythmia, angina, red flag symptoms, potential outcomes of testing and management of results.  Buford Dresser, MD, PhD [place]  [facility]   Medication Adjustments/Labs and Tests Ordered: Current medicines are reviewed at length with the patient today.  Concerns regarding medicines are outlined above.  Orders Placed This Encounter  Procedures  . LONG TERM MONITOR (3-14 DAYS)  . EKG 12-Lead  . ECHOCARDIOGRAM COMPLETE   No orders of  the defined types were placed in this encounter.   Patient Instructions  Medication Instructions: Your physician recommends that you continue on your current medications as directed.    If you need a refill on your cardiac medications before your next appointment, please call your pharmacy.   Labwork: None  Procedures/Testing: Your physician has requested that you have an echocardiogram. Echocardiography is a painless test that uses sound waves to create images of your heart. It provides your doctor with information about the size and shape of your heart and how well your heart's chambers and valves are working. This procedure takes approximately one hour. There are no restrictions for this procedure. [place] [address]  Our physician has recommended that you wear a 14 DAY ZIO-PATCH monitor. The Zio patch cardiac monitor continuously records heart rhythm data for up to 14 days, this is for patients being  evaluated for multiple types heart rhythms. For the first 24 hours post application, please avoid getting the Zio monitor wet in the shower or by excessive sweating during exercise. After that, feel free to carry on with regular activities. Keep soaps and lotions away from the ZIO XT Patch.   This will be placed at our [facility] location - [address], [address].       Follow-Up: Your physician wants you to follow-up as needed with Dr. Harrell Gave.  Special Instructions:    Thank you for choosing [facility] at [facility]!!       Signed, Buford Dresser, MD PhD 05/21/2018 7:41 AM    [place]

## 2018-05-20 NOTE — Patient Instructions (Signed)
Medication Instructions: Your physician recommends that you continue on your current medications as directed.    If you need a refill on your cardiac medications before your next appointment, please call your pharmacy.   Labwork: None  Procedures/Testing: Your physician has requested that you have an echocardiogram. Echocardiography is a painless test that uses sound waves to create images of your heart. It provides your doctor with information about the size and shape of your heart and how well your heart's chambers and valves are working. This procedure takes approximately one hour. There are no restrictions for this procedure. [place] [address]  Our physician has recommended that you wear a 14 DAY ZIO-PATCH monitor. The Zio patch cardiac monitor continuously records heart rhythm data for up to 14 days, this is for patients being evaluated for multiple types heart rhythms. For the first 24 hours post application, please avoid getting the Zio monitor wet in the shower or by excessive sweating during exercise. After that, feel free to carry on with regular activities. Keep soaps and lotions away from the ZIO XT Patch.   This will be placed at our [facility] location - [address].       Follow-Up: Your physician wants you to follow-up as needed with Dr. Harrell Gave.  Special Instructions:    Thank you for choosing [facility] at [facility]!!

## 2018-05-21 ENCOUNTER — Ambulatory Visit (INDEPENDENT_AMBULATORY_CARE_PROVIDER_SITE_OTHER): Payer: Medicare HMO

## 2018-05-21 ENCOUNTER — Ambulatory Visit (HOSPITAL_COMMUNITY): Payer: Medicare HMO | Attending: Cardiovascular Disease

## 2018-05-21 ENCOUNTER — Other Ambulatory Visit: Payer: Self-pay

## 2018-05-21 ENCOUNTER — Encounter: Payer: Self-pay | Admitting: Cardiology

## 2018-05-21 DIAGNOSIS — I10 Essential (primary) hypertension: Secondary | ICD-10-CM | POA: Insufficient documentation

## 2018-05-21 DIAGNOSIS — Z923 Personal history of irradiation: Secondary | ICD-10-CM | POA: Insufficient documentation

## 2018-05-21 DIAGNOSIS — R55 Syncope and collapse: Secondary | ICD-10-CM

## 2018-05-21 DIAGNOSIS — R002 Palpitations: Secondary | ICD-10-CM | POA: Diagnosis not present

## 2018-05-21 DIAGNOSIS — Z9221 Personal history of antineoplastic chemotherapy: Secondary | ICD-10-CM | POA: Insufficient documentation

## 2018-05-21 DIAGNOSIS — Z853 Personal history of malignant neoplasm of breast: Secondary | ICD-10-CM | POA: Diagnosis not present

## 2018-05-21 DIAGNOSIS — Z8249 Family history of ischemic heart disease and other diseases of the circulatory system: Secondary | ICD-10-CM | POA: Diagnosis not present

## 2018-05-21 DIAGNOSIS — I351 Nonrheumatic aortic (valve) insufficiency: Secondary | ICD-10-CM | POA: Diagnosis not present

## 2018-05-21 MED ORDER — PERFLUTREN LIPID MICROSPHERE
1.0000 mL | INTRAVENOUS | Status: AC | PRN
Start: 2018-05-21 — End: 2018-05-21
  Administered 2018-05-21: 2 mL via INTRAVENOUS

## 2018-05-28 DIAGNOSIS — M50122 Cervical disc disorder at C5-C6 level with radiculopathy: Secondary | ICD-10-CM | POA: Diagnosis not present

## 2018-05-28 DIAGNOSIS — M9901 Segmental and somatic dysfunction of cervical region: Secondary | ICD-10-CM | POA: Diagnosis not present

## 2018-05-28 DIAGNOSIS — Q72812 Congenital shortening of left lower limb: Secondary | ICD-10-CM | POA: Diagnosis not present

## 2018-05-28 DIAGNOSIS — M9905 Segmental and somatic dysfunction of pelvic region: Secondary | ICD-10-CM | POA: Diagnosis not present

## 2018-05-31 ENCOUNTER — Encounter: Payer: Self-pay | Admitting: Internal Medicine

## 2018-06-01 MED ORDER — LEVOTHYROXINE SODIUM 88 MCG PO TABS: 88.0000 ug | ORAL_TABLET | Freq: Every day | ORAL | 0 refills | Status: DC

## 2018-06-09 DIAGNOSIS — R55 Syncope and collapse: Secondary | ICD-10-CM | POA: Diagnosis not present

## 2018-06-19 NOTE — Progress Notes (Signed)
If she feels well on the 1 tablet of 25 mg atenolol daily, I am ok with continuing that. We can reassess her symptoms at her next visit, or please have her call if the symptoms get worse. Thank you.

## 2018-07-10 ENCOUNTER — Other Ambulatory Visit (INDEPENDENT_AMBULATORY_CARE_PROVIDER_SITE_OTHER): Payer: Medicare HMO

## 2018-07-10 ENCOUNTER — Ambulatory Visit (INDEPENDENT_AMBULATORY_CARE_PROVIDER_SITE_OTHER): Payer: Medicare HMO

## 2018-07-10 DIAGNOSIS — Z23 Encounter for immunization: Secondary | ICD-10-CM | POA: Diagnosis not present

## 2018-07-10 DIAGNOSIS — E038 Other specified hypothyroidism: Secondary | ICD-10-CM | POA: Diagnosis not present

## 2018-07-10 LAB — TSH: TSH: 0.09 u[IU]/mL — ABNORMAL LOW (ref 0.35–4.50)

## 2018-07-13 ENCOUNTER — Ambulatory Visit: Payer: Self-pay

## 2018-07-13 DIAGNOSIS — E038 Other specified hypothyroidism: Secondary | ICD-10-CM

## 2018-07-13 MED ORDER — LEVOTHYROXINE SODIUM 88 MCG PO TABS: ORAL_TABLET | ORAL | 0 refills | Status: DC

## 2018-07-13 NOTE — Telephone Encounter (Signed)
I received your result note for her that states take this dose 6 days a week. Would that amount help with this feeling she is having?

## 2018-07-13 NOTE — Telephone Encounter (Signed)
SPoke with pt to inform. Pt is going to take 88  Mcg 6 days a week, med list updated. Pt aware to come back for blood work. Labs ordered.

## 2018-07-13 NOTE — Telephone Encounter (Signed)
Pt. Reports her dose of Synthroid 88 mcg daily is too much. Complaining of feeling "hyper and not sleeping well." Can get to sleep, but the wakes up in the middle of the night. Wants to know if the dosage can be changed. Please advise pt.  Answer Assessment - Initial Assessment Questions 1. SYMPTOMS: "Do you have any symptoms?"     "I feel very hyper and I'm not sleeping." 2. SEVERITY: If symptoms are present, ask "Are they mild, moderate or severe?"     Moderate  Protocols used: MEDICATION QUESTION CALL-A-AH

## 2018-07-13 NOTE — Telephone Encounter (Signed)
Yes decreasing the dose should help.  She can either take the 88 mcg 6 days a week or we can send in a new prescription for 75 mcg 7 days a week-they are about the same.  She should recheck her TSH in 2 months.

## 2018-07-13 NOTE — Addendum Note (Signed)
Addended by: Terence Lux B on: 07/13/2018 01:46 PM   Modules accepted: Orders

## 2018-08-14 ENCOUNTER — Other Ambulatory Visit: Payer: Self-pay | Admitting: Obstetrics and Gynecology

## 2018-08-14 DIAGNOSIS — Z1231 Encounter for screening mammogram for malignant neoplasm of breast: Secondary | ICD-10-CM

## 2018-08-28 ENCOUNTER — Other Ambulatory Visit: Payer: Self-pay | Admitting: Internal Medicine

## 2018-09-10 ENCOUNTER — Other Ambulatory Visit (INDEPENDENT_AMBULATORY_CARE_PROVIDER_SITE_OTHER): Payer: Medicare HMO

## 2018-09-10 ENCOUNTER — Encounter: Payer: Self-pay | Admitting: Internal Medicine

## 2018-09-10 DIAGNOSIS — E038 Other specified hypothyroidism: Secondary | ICD-10-CM | POA: Diagnosis not present

## 2018-09-10 LAB — TSH: TSH: 1.8 u[IU]/mL (ref 0.35–4.50)

## 2018-09-15 ENCOUNTER — Encounter: Payer: Self-pay | Admitting: Internal Medicine

## 2018-09-15 NOTE — Patient Instructions (Addendum)
Tests ordered today. Your results will be released to [place] (or called to you) after review, usually within 72hours after test completion. If any changes need to be made, you will be notified at that same time.  All other Health Maintenance issues reviewed.   All recommended immunizations and age-appropriate screenings are up-to-date or discussed.  No immunizations administered today.   Medications reviewed and updated.  Changes include :   none  Your prescription(s) have been submitted to your pharmacy. Please take as directed and contact our office if you believe you are having problem(s) with the medication(s).  A referral was ordered for Gi  Please followup in one year   Health Maintenance, Female Adopting a healthy lifestyle and getting preventive care can go a long way to promote health and wellness. Talk with your health care provider about what schedule of regular examinations is right for you. This is a good chance for you to check in with your provider about disease prevention and staying healthy. In between checkups, there are plenty of things you can do on your own. Experts have done a lot of research about which lifestyle changes and preventive measures are most likely to keep you healthy. Ask your health care provider for more information. Weight and diet Eat a healthy diet  Be sure to include plenty of vegetables, fruits, low-fat dairy products, and lean protein.  Do not eat a lot of foods high in solid fats, added sugars, or salt.  Get regular exercise. This is one of the most important things you can do for your health. ? Most adults should exercise for at least 150 minutes each week. The exercise should increase your heart rate and make you sweat (moderate-intensity exercise). ? Most adults should also do strengthening exercises at least twice a week. This is in addition to the moderate-intensity exercise. Maintain a healthy weight  Body mass index (BMI) is a  measurement that can be used to identify possible weight problems. It estimates body fat based on height and weight. Your health care provider can help determine your BMI and help you achieve or maintain a healthy weight.  For females 39 years of age and older: ? A BMI below 18.5 is considered underweight. ? A BMI of 18.5 to 24.9 is normal. ? A BMI of 25 to 29.9 is considered overweight. ? A BMI of 30 and above is considered obese. Watch levels of cholesterol and blood lipids  You should start having your blood tested for lipids and cholesterol at 77 years of age, then have this test every 5 years.  You may need to have your cholesterol levels checked more often if: ? Your lipid or cholesterol levels are high. ? You are older than 77 years of age. ? You are at high risk for heart disease. Cancer screening Lung Cancer  Lung cancer screening is recommended for adults 99-84 years old who are at high risk for lung cancer because of a history of smoking.  A yearly low-dose CT scan of the lungs is recommended for people who: ? Currently smoke. ? Have quit within the past 15 years. ? Have at least a 30-pack-year history of smoking. A pack year is smoking an average of one pack of cigarettes a day for 1 year.  Yearly screening should continue until it has been 15 years since you quit.  Yearly screening should stop if you develop a health problem that would prevent you from having lung cancer treatment. Breast Cancer  Practice  breast self-awareness. This means understanding how your breasts normally appear and feel.  It also means doing regular breast self-exams. Let your health care provider know about any changes, no matter how small.  If you are in your 20s or 30s, you should have a clinical breast exam (CBE) by a health care provider every 1-3 years as part of a regular health exam.  If you are 41 or older, have a CBE every year. Also consider having a breast X-ray (mammogram) every  year.  If you have a family history of breast cancer, talk to your health care provider about genetic screening.  If you are at high risk for breast cancer, talk to your health care provider about having an MRI and a mammogram every year.  Breast cancer gene (BRCA) assessment is recommended for women who have family members with BRCA-related cancers. BRCA-related cancers include: ? Breast. ? Ovarian. ? Tubal. ? Peritoneal cancers.  Results of the assessment will determine the need for genetic counseling and BRCA1 and BRCA2 testing. Cervical Cancer Your health care provider may recommend that you be screened regularly for cancer of the pelvic organs (ovaries, uterus, and vagina). This screening involves a pelvic examination, including checking for microscopic changes to the surface of your cervix (Pap test). You may be encouraged to have this screening done every 3 years, beginning at age 84.  For women ages 84-65, health care providers may recommend pelvic exams and Pap testing every 3 years, or they may recommend the Pap and pelvic exam, combined with testing for human papilloma virus (HPV), every 5 years. Some types of HPV increase your risk of cervical cancer. Testing for HPV may also be done on women of any age with unclear Pap test results.  Other health care providers may not recommend any screening for nonpregnant women who are considered low risk for pelvic cancer and who do not have symptoms. Ask your health care provider if a screening pelvic exam is right for you.  If you have had past treatment for cervical cancer or a condition that could lead to cancer, you need Pap tests and screening for cancer for at least 20 years after your treatment. If Pap tests have been discontinued, your risk factors (such as having a new sexual partner) need to be reassessed to determine if screening should resume. Some women have medical problems that increase the chance of getting cervical cancer. In  these cases, your health care provider may recommend more frequent screening and Pap tests. Colorectal Cancer  This type of cancer can be detected and often prevented.  Routine colorectal cancer screening usually begins at 77 years of age and continues through 77 years of age.  Your health care provider may recommend screening at an earlier age if you have risk factors for colon cancer.  Your health care provider may also recommend using home test kits to check for hidden blood in the stool.  A small camera at the end of a tube can be used to examine your colon directly (sigmoidoscopy or colonoscopy). This is done to check for the earliest forms of colorectal cancer.  Routine screening usually begins at age 71.  Direct examination of the colon should be repeated every 5-10 years through 76 years of age. However, you may need to be screened more often if early forms of precancerous polyps or small growths are found. Skin Cancer  Check your skin from head to toe regularly.  Tell your health care provider about any new  moles or changes in moles, especially if there is a change in a mole's shape or color.  Also tell your health care provider if you have a mole that is larger than the size of a pencil eraser.  Always use sunscreen. Apply sunscreen liberally and repeatedly throughout the day.  Protect yourself by wearing long sleeves, pants, a wide-brimmed hat, and sunglasses whenever you are outside. Heart disease, diabetes, and high blood pressure  High blood pressure causes heart disease and increases the risk of stroke. High blood pressure is more likely to develop in: ? People who have blood pressure in the high end of the normal range (130-139/85-89 mm Hg). ? People who are overweight or obese. ? People who are African American.  If you are 25-16 years of age, have your blood pressure checked every 3-5 years. If you are 66 years of age or older, have your blood pressure checked  every year. You should have your blood pressure measured twice-once when you are at a hospital or clinic, and once when you are not at a hospital or clinic. Record the average of the two measurements. To check your blood pressure when you are not at a hospital or clinic, you can use: ? An automated blood pressure machine at a pharmacy. ? A home blood pressure monitor.  If you are between 63 years and 10 years old, ask your health care provider if you should take aspirin to prevent strokes.  Have regular diabetes screenings. This involves taking a blood sample to check your fasting blood sugar level. ? If you are at a normal weight and have a low risk for diabetes, have this test once every three years after 77 years of age. ? If you are overweight and have a high risk for diabetes, consider being tested at a younger age or more often. Preventing infection Hepatitis B  If you have a higher risk for hepatitis B, you should be screened for this virus. You are considered at high risk for hepatitis B if: ? You were born in a country where hepatitis B is common. Ask your health care provider which countries are considered high risk. ? Your parents were born in a high-risk country, and you have not been immunized against hepatitis B (hepatitis B vaccine). ? You have HIV or AIDS. ? You use needles to inject street drugs. ? You live with someone who has hepatitis B. ? You have had sex with someone who has hepatitis B. ? You get hemodialysis treatment. ? You take certain medicines for conditions, including cancer, organ transplantation, and autoimmune conditions. Hepatitis C  Blood testing is recommended for: ? Everyone born from [birth year] through 1964. ? Anyone with known risk factors for hepatitis C. Sexually transmitted infections (STIs)  You should be screened for sexually transmitted infections (STIs) including gonorrhea and chlamydia if: ? You are sexually active and are younger than 77 years of  age. ? You are older than 77 years of age and your health care provider tells you that you are at risk for this type of infection. ? Your sexual activity has changed since you were last screened and you are at an increased risk for chlamydia or gonorrhea. Ask your health care provider if you are at risk.  If you do not have HIV, but are at risk, it may be recommended that you take a prescription medicine daily to prevent HIV infection. This is called pre-exposure prophylaxis (PrEP). You are considered at risk if: ? You  are sexually active and do not regularly use condoms or know the HIV status of your partner(s). ? You take drugs by injection. ? You are sexually active with a partner who has HIV. Talk with your health care provider about whether you are at high risk of being infected with HIV. If you choose to begin PrEP, you should first be tested for HIV. You should then be tested every 3 months for as long as you are taking PrEP. Pregnancy  If you are premenopausal and you may become pregnant, ask your health care provider about preconception counseling.  If you may become pregnant, take 400 to 800 micrograms (mcg) of folic acid every day.  If you want to prevent pregnancy, talk to your health care provider about birth control (contraception). Osteoporosis and menopause  Osteoporosis is a disease in which the bones lose minerals and strength with aging. This can result in serious bone fractures. Your risk for osteoporosis can be identified using a bone density scan.  If you are 72 years of age or older, or if you are at risk for osteoporosis and fractures, ask your health care provider if you should be screened.  Ask your health care provider whether you should take a calcium or vitamin D supplement to lower your risk for osteoporosis.  Menopause may have certain physical symptoms and risks.  Hormone replacement therapy may reduce some of these symptoms and risks. Talk to your health  care provider about whether hormone replacement therapy is right for you. Follow these instructions at home:  Schedule regular health, dental, and eye exams.  Stay current with your immunizations.  Do not use any tobacco products including cigarettes, chewing tobacco, or electronic cigarettes.  If you are pregnant, do not drink alcohol.  If you are breastfeeding, limit how much and how often you drink alcohol.  Limit alcohol intake to no more than 1 drink per day for nonpregnant women. One drink equals 12 ounces of beer, 5 ounces of wine, or 1 ounces of hard liquor.  Do not use street drugs.  Do not share needles.  Ask your health care provider for help if you need support or information about quitting drugs.  Tell your health care provider if you often feel depressed.  Tell your health care provider if you have ever been abused or do not feel safe at home. This information is not intended to replace advice given to you by your health care provider. Make sure you discuss any questions you have with your health care provider. Document Released: 02/18/2011 Document Revised: 01/11/2016 Document Reviewed: 05/09/2015 Elsevier Interactive Patient Education  2019 [employer].

## 2018-09-15 NOTE — Assessment & Plan Note (Signed)
Had neurocognitive testing with neurology-no evidence of cognitive impairment Symptoms related to generalized anxiety

## 2018-09-15 NOTE — Progress Notes (Signed)
Subjective:    Patient ID: Emily [name], female    DOB: [birth year], 78 y.o.   MRN: [id number]  HPI She is here for a physical exam.   She does not sleep good often.  She will read.  She sleeps well after a few nights of not sleeping.  Melatonin and Benadryl make her more energized and do not work for sleep.  She wonders what else she could do.  She denies any changes in her health.  Overall she feels well.  Medications and allergies reviewed with patient and updated if appropriate.  Patient Active Problem List   Diagnosis Date Noted  . Pre-syncope 05/19/2018  . DDD (degenerative disc disease), cervical 01/15/2018  . Neuralgia 09/12/2017  . Memory difficulties 11/06/2016  . IBS (irritable bowel syndrome) 04/16/2016  . Hyperlipidemia 09/10/2015  . Osteopenia 09/08/2015  . Palpitations   . Pleural plaque without asbestos 12/03/2010  . Cervicalgia 12/18/2009  . DIVERTICULOSIS, COLON 03/02/2009  . DYSPLASTIC NEVUS, [place] 12/06/2008  . Lumbago 03/11/2008  . Actinic keratosis 01/23/2008  . CARCINOMA, BREAST, ESTROGEN RECEPTOR NEGATIVE 10/12/2007  . Hypothyroidism 06/18/2007  . ALLERGIC RHINITIS 06/11/2007    Current Outpatient Medications on File Prior to Visit  Medication Sig Dispense Refill  . atenolol (TENORMIN) 25 MG tablet Take 1 tablet (25 mg total) by mouth daily. Annual appt due in January must see provider for future refills 90 tablet 1  . Calcium-Vitamin D-Vitamin K (CALCIUM + D + K) [phone] MG-UNT-MCG TABS Take 1 tablet by mouth 2 (two) times daily.      Marland Kitchen levothyroxine (SYNTHROID, LEVOTHROID) 75 MCG tablet Take 75 mcg by mouth daily before breakfast.     No current facility-administered medications on file prior to visit.     Past Medical History:  Diagnosis Date  . Actinic keratosis   . Allergic rhinitis   . BACK PAIN   . 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 Cervicalgia   . Cholelithiasis   .  DIVERTICULOSIS, COLON   . ENDOMETRIOSIS   . GERD (gastroesophageal reflux disease)   . Heart palpitations   . Hypertension   . HYPOTHYROIDISM   . Left wrist fracture 11/06/2016   10/2016 - fell of high stool  . Pleural plaque without asbestos    CT chest 10/2010: R>L lobes, upper> lower  . Squamous cell carcinoma    History of BC    Past Surgical History:  Procedure Laterality Date  . BREAST LUMPECTOMY  2001   left  . CATARACT EXTRACTION Bilateral 09/2014   both eyes  . CHOLECYSTECTOMY  2010  . ENDOMETRIAL ABLATION    . EXCISION / BIOPSY BREAST / NIPPLE / DUCT Left 2001  . TUBAL LIGATION      Social History   Socioeconomic History  . Marital status: Married    Spouse name: Not on file  . Number of children: 2  . Years of education: Not on file  . Highest education level: Not on file  Occupational History  . Occupation: Retired    Comment: Chiropractor  Social Needs  . Financial resource strain: Not on file  . Food insecurity:    Worry: Not on file    Inability: Not on file  . Transportation needs:    Medical: Not on file    Non-medical: Not on file  Tobacco Use  . Smoking status: Never Smoker  . Smokeless tobacco: Never Used  Substance and  Sexual Activity  . Alcohol use: No  . Drug use: No  . Sexual activity: Not on file  Lifestyle  . Physical activity:    Days per week: Not on file    Minutes per session: Not on file  . Stress: Not on file  Relationships  . Social connections:    Talks on phone: Not on file    Gets together: Not on file    Attends religious service: Not on file    Active member of club or organization: Not on file    Attends meetings of clubs or organizations: Not on file    Relationship status: Not on file  Other Topics Concern  . Not on file  Social History Narrative   Married, lives in [place] area since 2008 from [place] to be close to dtr    Family History  Problem Relation Age of Onset  . Heart disease Father   . Emphysema  Father        smoked and worked with asbestos  . Colon cancer Neg Hx     Review of Systems  Constitutional: Negative for chills and fever.  Eyes: Negative for visual disturbance.  Respiratory: Negative for cough, shortness of breath and wheezing.   Cardiovascular: Negative for chest pain, palpitations and leg swelling.  Gastrointestinal: Negative for abdominal pain, blood in stool, constipation, diarrhea and nausea.       No gerd  Genitourinary: Negative for dysuria and hematuria.  Musculoskeletal: Positive for arthralgias. Negative for back pain.       Small tear in left biceps - pain, no weakness  Skin: Negative for color change and rash.  Neurological: Negative for light-headedness and headaches.  Psychiatric/Behavioral: Negative for dysphoric mood. The patient is not nervous/anxious.        Objective:   Vitals:   09/16/18 0806  BP: 118/82  Pulse: 65  Resp: 16  Temp: (!) 97.4 F (36.3 C)  SpO2: 98%   Filed Weights   09/16/18 0806  Weight: 122 lb 12.8 oz (55.7 kg)   Body mass index is 23.2 kg/m.  BP Readings from Last 3 Encounters:  09/16/18 118/82  05/20/18 128/66  05/19/18 (!) 142/84    Wt Readings from Last 3 Encounters:  09/16/18 122 lb 12.8 oz (55.7 kg)  05/20/18 120 lb 1.3 oz (54.5 kg)  05/19/18 120 lb 12.8 oz (54.8 kg)     Physical Exam Constitutional: She appears well-developed and well-nourished. No distress.  HENT:  Head: Normocephalic and atraumatic.  Right Ear: External ear normal. Normal ear canal and TM Left Ear: External ear normal.  Normal ear canal and TM Mouth/Throat: Oropharynx is clear and moist.  Eyes: Conjunctivae and EOM are normal.  Neck: Neck supple. No tracheal deviation present. No thyromegaly present.  No carotid bruit  Cardiovascular: Normal rate, regular rhythm and normal heart sounds.   No murmur heard.  No edema. Pulmonary/Chest: Effort normal and breath sounds normal. No respiratory distress. She has no wheezes. She has  no rales.  Breast: deferred to Gyn Abdominal: Soft. She exhibits no distension. There is no tenderness.  Lymphadenopathy: She has no cervical adenopathy.  Skin: Skin is warm and dry. She is not diaphoretic.  Psychiatric: She has a normal mood and affect. Her behavior is normal.        Assessment & Plan:   Physical exam: Screening blood work   ordered-to be done in a few months Immunizations   discussed new shingles vaccine, other vaccines up-to-date Colonoscopy   ?  Need repeat - will refer to GI  Mammogram up-to-date-scheduled 10/2018 Gyn   up-to-date-Dr. Garwin Brothers Dexa    up-to-date-due November/2020 Eye exams   Up to date  EKG   05/2018 Exercise not currently  - will start to walk on treadmill Weight    BMI normal Skin   No concerns.  - sees dermatology annually Substance abuse    none  See Problem List for Assessment and Plan of chronic medical problems.  Follow-up annually

## 2018-09-16 ENCOUNTER — Ambulatory Visit (INDEPENDENT_AMBULATORY_CARE_PROVIDER_SITE_OTHER): Payer: Medicare HMO | Admitting: Internal Medicine

## 2018-09-16 ENCOUNTER — Encounter: Payer: Self-pay | Admitting: Internal Medicine

## 2018-09-16 ENCOUNTER — Ambulatory Visit (INDEPENDENT_AMBULATORY_CARE_PROVIDER_SITE_OTHER): Payer: Medicare HMO | Admitting: *Deleted

## 2018-09-16 VITALS — BP 118/82 | HR 65 | Resp 16 | Ht 61.0 in | Wt 122.0 lb

## 2018-09-16 VITALS — BP 118/82 | HR 65 | Temp 97.4°F | Resp 16 | Ht 61.0 in | Wt 122.8 lb

## 2018-09-16 DIAGNOSIS — E782 Mixed hyperlipidemia: Secondary | ICD-10-CM | POA: Diagnosis not present

## 2018-09-16 DIAGNOSIS — Z0000 Encounter for general adult medical examination without abnormal findings: Secondary | ICD-10-CM

## 2018-09-16 DIAGNOSIS — Z1211 Encounter for screening for malignant neoplasm of colon: Secondary | ICD-10-CM | POA: Diagnosis not present

## 2018-09-16 DIAGNOSIS — R002 Palpitations: Secondary | ICD-10-CM | POA: Diagnosis not present

## 2018-09-16 DIAGNOSIS — E038 Other specified hypothyroidism: Secondary | ICD-10-CM

## 2018-09-16 DIAGNOSIS — M85869 Other specified disorders of bone density and structure, unspecified lower leg: Secondary | ICD-10-CM | POA: Diagnosis not present

## 2018-09-16 MED ORDER — LEVOTHYROXINE SODIUM 75 MCG PO TABS: 75.0000 ug | ORAL_TABLET | Freq: Every day | ORAL | 1 refills | Status: DC

## 2018-09-16 MED ORDER — ATENOLOL 25 MG PO TABS
25.0000 mg | ORAL_TABLET | Freq: Every day | ORAL | 1 refills | Status: DC
Start: 2018-09-16 — End: 2019-04-12

## 2018-09-16 NOTE — Assessment & Plan Note (Signed)
She did increase the atenolol to a full dose again and her palpitations have improved BP ok No palpitations Continue atenolol 25 mg daily

## 2018-09-16 NOTE — Assessment & Plan Note (Signed)
Bone density up-to-date-due later this year-we will order at her next physical Continue calcium and vitamin D Not currently exercising, but plans on starting to walk on the treadmill has been stressed the importance of regular exercise

## 2018-09-16 NOTE — Progress Notes (Addendum)
Subjective:   Emily [name] is a 78 y.o. female who presents for Medicare Annual (Subsequent) preventive examination.  Review of Systems:  No ROS.  Medicare Wellness Visit. Additional risk factors are reflected in the social history.  Cardiac Risk Factors include: advanced age (>38men, >58 women);dyslipidemia Sleep patterns: has interrupted sleep, gets up 2 times nightly to void and sleeps hours varies nightly. Patient reports insomnia issues, discussed recommended sleep tips and stress reduction tips. Relevant patient education assigned to patient using Emmi.  Home Safety/Smoke Alarms: Feels safe in home. Smoke alarms in place.  Living environment; residence and Firearm Safety: 2-story house, no firearms. Lives with husband, no needs for DME, good support system Seat Belt Safety/Bike Helmet: Wears seat belt.     Objective:     Vitals: BP 118/82   Pulse 65   Resp 16   Ht 5\' 1"  (1.549 m)   Wt 122 lb (55.3 kg)   SpO2 98%   BMI 23.05 kg/m   Body mass index is 23.05 kg/m.  Advanced Directives 09/16/2018 04/20/2015  Does Patient Have a Medical Advance Directive? Yes Yes  Type of Paramedic of [place];Living will -  Copy of [place] in Chart? No - copy requested Yes    Tobacco Social History   Tobacco Use  Smoking Status Never Smoker  Smokeless Tobacco Never Used     Counseling given: Not Answered  Past Medical History:  Diagnosis Date  . Actinic keratosis   . Allergic rhinitis   . BACK PAIN   . 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 Cervicalgia   . Cholelithiasis   . DIVERTICULOSIS, COLON   . ENDOMETRIOSIS   . GERD (gastroesophageal reflux disease)   . Heart palpitations   . Hypertension   . HYPOTHYROIDISM   . Left wrist fracture 11/06/2016   10/2016 - fell of high stool  . Pleural plaque without asbestos    CT chest 10/2010: R>L lobes, upper> lower  . Squamous cell  carcinoma    History of BC   Past Surgical History:  Procedure Laterality Date  . BREAST LUMPECTOMY  2001   left  . CATARACT EXTRACTION Bilateral 09/2014   both eyes  . CHOLECYSTECTOMY  2010  . ENDOMETRIAL ABLATION    . EXCISION / BIOPSY BREAST / NIPPLE / DUCT Left 2001  . TUBAL LIGATION     Family History  Problem Relation Age of Onset  . Heart disease Father   . Emphysema Father        smoked and worked with asbestos  . Colon cancer Neg Hx    Social History   Socioeconomic History  . Marital status: Married    Spouse name: Not on file  . Number of children: 2  . Years of education: Not on file  . Highest education level: Not on file  Occupational History  . Occupation: Retired    Comment: Chiropractor  Social Needs  . Financial resource strain: Not hard at all  . Food insecurity:    Worry: Never true    Inability: Never true  . Transportation needs:    Medical: No    Non-medical: No  Tobacco Use  . Smoking status: Never Smoker  . Smokeless tobacco: Never Used  Substance and Sexual Activity  . Alcohol use: No  . Drug use: No  . Sexual activity: Never  Lifestyle  . Physical activity:  Days per week: 0 days    Minutes per session: 0 min  . Stress: Not at all  Relationships  . Social connections:    Talks on phone: More than three times a week    Gets together: More than three times a week    Attends religious service: 1 to 4 times per year    Active member of club or organization: Yes    Attends meetings of clubs or organizations: More than 4 times per year    Relationship status: Married  Other Topics Concern  . Not on file  Social History Narrative   Married, lives in [place] area since 2008 from [place] to be close to dtr    Outpatient Encounter Medications as of 09/16/2018  Medication Sig  . Calcium-Vitamin D-Vitamin K (CALCIUM + D + K) 017-510-25 MG-UNT-MCG TABS Take 1 tablet by mouth 2 (two) times daily.    . [DISCONTINUED] atenolol (TENORMIN)  25 MG tablet Take 1 tablet (25 mg total) by mouth daily. Annual appt due in January must see provider for future refills  . [DISCONTINUED] levothyroxine (SYNTHROID, LEVOTHROID) 75 MCG tablet Take 75 mcg by mouth daily before breakfast.  . [DISCONTINUED] levothyroxine (SYNTHROID, LEVOTHROID) 88 MCG tablet TAKE 1 TABLET BY MOUTH EVERY DAY   No facility-administered encounter medications on file as of 09/16/2018.     Activities of Daily Living In your present state of health, do you have any difficulty performing the following activities: 09/16/2018  Hearing? N  Vision? N  Difficulty concentrating or making decisions? N  Walking or climbing stairs? N  Dressing or bathing? N  Doing errands, shopping? N  Preparing Food and eating ? N  Using the Toilet? N  In the past six months, have you accidently leaked urine? N  Do you have problems with loss of bowel control? N  Managing your Medications? N  Managing your Finances? N  Housekeeping or managing your Housekeeping? N  Some recent data might be hidden    Patient Care Team: Binnie Rail, MD as PCP - General (Internal Medicine) Buford Dresser, MD as PCP - Cardiology (Cardiology) Servando Salina, MD as Consulting Physician (Obstetrics and Gynecology) Jackolyn Confer, MD as Consulting Physician (General Surgery) Tanda Rockers, MD as Consulting Physician (Pulmonary Disease) Ladell Pier, MD as Consulting Physician (Internal Medicine) Marchia Bond, MD (Orthopedic Surgery) Calvert Cantor, MD (Ophthalmology)    Assessment:   This is a routine wellness examination for Emily [name]. Physical assessment deferred to PCP.  Exercise Activities and Dietary recommendations Current Exercise Habits: The patient does not participate in regular exercise at present, Exercise limited by: orthopedic condition(s)  Diet (meal preparation, eat out, water intake, caffeinated beverages, dairy products, fruits and vegetables): in general, a  "healthy" diet  , well balanced   Reviewed heart healthy diet. Encouraged patient to increase daily water and healthy fluid intake.  Goals    . Exercise 150 minutes per week (moderate activity)     Return to water walking x 2 per week    . Patient Stated     I am going walk on my treadmill, increase the amount of fluid I drink and work towards improving how much I sleep.       Fall Risk Fall Risk  09/16/2018 09/12/2017 04/20/2015 07/14/2013  Falls in the past year? 0 Yes No No  Number falls in past yr: - 1 - -  Injury with Fall? - Yes - -    Depression Screen PHQ 2/9  Scores 09/16/2018 09/12/2017 04/20/2015 07/14/2013  PHQ - 2 Score 0 0 0 0     Cognitive Function MMSE - Mini Mental State Exam 04/20/2015  Not completed: (No Data)       Ad8 score reviewed for issues:  Issues making decisions: no  Less interest in hobbies / activities: no  Repeats questions, stories (family complaining): no  Trouble using ordinary gadgets (microwave, computer, phone):no  Forgets the month or year: no  Mismanaging finances: no  Remembering appts: no  Daily problems with thinking and/or memory: no Ad8 score is= 0  Immunization History  Administered Date(s) Administered  . Influenza Split 05/20/2011, 06/10/2012  . Influenza Whole 06/18/2007  . Influenza, High Dose Seasonal PF 06/07/2013, 04/20/2015, 07/25/2016, 06/19/2017, 07/10/2018  . Influenza,inj,Quad PF,6+ Mos 05/31/2014  . Pneumococcal Conjugate-13 07/28/2014  . Pneumococcal Polysaccharide-23 06/18/2007  . Td 06/18/2007  . Tdap 07/06/2017  . Zoster 11/12/2011   Screening Tests Health Maintenance  Topic Date Due  . DEXA SCAN  07/18/2019  . TETANUS/TDAP  07/07/2027  . INFLUENZA VACCINE  Completed  . PNA vac Low Risk Adult  Completed      Plan:     Reviewed health maintenance screenings with patient today and relevant education, vaccines, and/or referrals were provided.   Continue doing brain stimulating activities  (puzzles, reading, adult coloring books, staying active) to keep memory sharp.   Continue to eat heart healthy diet (full of fruits, vegetables, whole grains, lean protein, water--limit salt, fat, and sugar intake) and increase physical activity as tolerated.  I have personally reviewed and noted the following in the patient's chart:   . Medical and social history . Use of alcohol, tobacco or illicit drugs  . Current medications and supplements . Functional ability and status . Nutritional status . Physical activity . Advanced directives . List of other physicians . Vitals . Screenings to include cognitive, depression, and falls . Referrals and appointments  In addition, I have reviewed and discussed with patient certain preventive protocols, quality metrics, and best practice recommendations. A written personalized care plan for preventive services as well as general preventive health recommendations were provided to patient.     Michiel Cowboy, RN  09/16/2018   Medical screening examination/treatment/procedure(s) were performed by non-physician practitioner and as supervising physician I was immediately available for consultation/collaboration. I agree with above. Binnie Rail, MD

## 2018-09-16 NOTE — Patient Instructions (Addendum)
Continue doing brain stimulating activities (puzzles, reading, adult coloring books, staying active) to keep memory sharp.   Continue to eat heart healthy diet (full of fruits, vegetables, whole grains, lean protein, water--limit salt, fat, and sugar intake) and increase physical activity as tolerated.   Emily [name] , Thank you for taking time to come for your Medicare Wellness Visit. I appreciate your ongoing commitment to your health goals. Please review the following plan we discussed and let me know if I can assist you in the future.   These are the goals we discussed: Goals    . Exercise 150 minutes per week (moderate activity)     Return to water walking x 2 per week    . Patient Stated     I am going walk on my treadmill, increase the amount of fluid I drink and work towards improving how much I sleep.       This is a list of the screening recommended for you and due dates:  Health Maintenance  Topic Date Due  . DEXA scan (bone density measurement)  07/18/2019  . Tetanus Vaccine  07/07/2027  . Flu Shot  Completed  . Pneumonia vaccines  Completed

## 2018-09-16 NOTE — Assessment & Plan Note (Signed)
Cholesterol has been slightly elevated-ID will LDL should be closer to 100 She is not currently exercising and plans on starting to walk on a treadmill Low-fat/cholesterol diet Ordered blood work including CMP, TSH, lipid panel-she will get this done in a few months after she starts exercising on a regular basis Ideally she would like to avoid medication

## 2018-09-16 NOTE — Assessment & Plan Note (Addendum)
Recent tsh in normal range Continue current dose of medication - refilled today Recheck TSH in a few months

## 2018-09-29 DIAGNOSIS — Z01419 Encounter for gynecological examination (general) (routine) without abnormal findings: Secondary | ICD-10-CM | POA: Diagnosis not present

## 2018-09-30 DIAGNOSIS — M13812 Other specified arthritis, left shoulder: Secondary | ICD-10-CM | POA: Diagnosis not present

## 2018-09-30 DIAGNOSIS — M25512 Pain in left shoulder: Secondary | ICD-10-CM | POA: Diagnosis not present

## 2018-10-05 DIAGNOSIS — M75102 Unspecified rotator cuff tear or rupture of left shoulder, not specified as traumatic: Secondary | ICD-10-CM | POA: Insufficient documentation

## 2018-10-06 DIAGNOSIS — Z1211 Encounter for screening for malignant neoplasm of colon: Secondary | ICD-10-CM | POA: Diagnosis not present

## 2018-10-06 DIAGNOSIS — Z1212 Encounter for screening for malignant neoplasm of rectum: Secondary | ICD-10-CM | POA: Diagnosis not present

## 2018-10-07 DIAGNOSIS — M25512 Pain in left shoulder: Secondary | ICD-10-CM | POA: Diagnosis not present

## 2018-10-09 LAB — COLOGUARD: Cologuard: NEGATIVE

## 2018-10-15 DIAGNOSIS — M5030 Other cervical disc degeneration, unspecified cervical region: Secondary | ICD-10-CM | POA: Diagnosis not present

## 2018-10-15 DIAGNOSIS — M75102 Unspecified rotator cuff tear or rupture of left shoulder, not specified as traumatic: Secondary | ICD-10-CM | POA: Diagnosis not present

## 2018-10-15 DIAGNOSIS — M25512 Pain in left shoulder: Secondary | ICD-10-CM | POA: Diagnosis not present

## 2018-10-17 DIAGNOSIS — M542 Cervicalgia: Secondary | ICD-10-CM | POA: Diagnosis not present

## 2018-10-17 DIAGNOSIS — M5030 Other cervical disc degeneration, unspecified cervical region: Secondary | ICD-10-CM | POA: Diagnosis not present

## 2018-10-28 ENCOUNTER — Ambulatory Visit: Payer: Medicare HMO

## 2018-11-01 ENCOUNTER — Encounter: Payer: Self-pay | Admitting: Internal Medicine

## 2018-11-11 ENCOUNTER — Encounter (INDEPENDENT_AMBULATORY_CARE_PROVIDER_SITE_OTHER): Payer: Medicare HMO | Admitting: Internal Medicine

## 2018-11-11 DIAGNOSIS — R070 Pain in throat: Secondary | ICD-10-CM | POA: Diagnosis not present

## 2018-11-11 NOTE — Telephone Encounter (Signed)
Cumulative time during 7-day interval 7 minutes, there was not an associated office visit for this concern within a 7 day period.  Patient did provide consent for services prior to services given. Names of all persons present for services: Binnie Rail, MD, [place]  Chief complaint: intermittent sore throat, need to clear throat often, questions if she should be tested for coronavirus  History, background, results pertinent:  Past Medical History:  Diagnosis Date  . Actinic keratosis   . Allergic rhinitis   . BACK PAIN   . 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 Cervicalgia   . Cholelithiasis   . DIVERTICULOSIS, COLON   . ENDOMETRIOSIS   . GERD (gastroesophageal reflux disease)   . Heart palpitations   . Hypertension   . HYPOTHYROIDISM   . Left wrist fracture 11/06/2016   10/2016 - fell of high stool  . Pleural plaque without asbestos    CT chest 10/2010: R>L lobes, upper> lower  . Squamous cell carcinoma    History of BC     A/P/next steps: as discussed in mychart message symptoms are mild and should be treated with symptomatic measures and she should remain at home.  Discussed that we are not currently doing testing and that that is not needed given the mild symptoms that she is experiencing. Advised that I can see her in the office if needed, but ideally she should remain at home unless her symptoms get worse. Recommended review think she could try for her symptoms.  Advised her to let me know if her symptoms worsen or do not improve or if she has other questions/concerns.

## 2018-11-17 ENCOUNTER — Telehealth: Payer: Medicare HMO | Admitting: Physician Assistant

## 2018-11-17 DIAGNOSIS — J302 Other seasonal allergic rhinitis: Secondary | ICD-10-CM | POA: Diagnosis not present

## 2018-11-17 NOTE — Progress Notes (Signed)
E visit for Allergic Rhinitis We are sorry that you are not feeling well.  Here is how we plan to help!  Based on what you have shared with me it looks like you have Allergic Rhinitis.  Rhinitis is when a reaction occurs that causes nasal congestion, runny nose, sneezing, and itching.  Most types of rhinitis are caused by an inflammation and are associated with symptoms in the eyes ears or throat. There are several types of rhinitis.  The most common are acute rhinitis, which is usually caused by a viral illness, allergic or seasonal rhinitis, and nonallergic or year-round rhinitis.  Nasal allergies occur certain times of the year.  Allergic rhinitis is caused when allergens in the air trigger the release of histamine in the body.  Histamine causes itching, swelling, and fluid to build up in the fragile linings of the nasal passages, sinuses and eyelids.  An itchy nose and clear discharge are common.  I recommend the following over the counter treatments: You should take a daily dose of antihistamine, thus please continue the zyrtec.   I also would recommend a nasal spray: Flonase 2 sprays into each nostril once daily    HOME CARE:  You can use an over-the-counter saline nasal spray as needed Avoid areas where there is heavy dust, mites, or molds Stay indoors on windy days during the pollen season Keep windows closed in home, at least in bedroom; use air conditioner. Use high-efficiency house air filter Keep windows closed in car, turn AC on re-circulate Avoid playing out with dog during pollen season  GET HELP RIGHT AWAY IF:  If your symptoms do not improve within 10 days You become short of breath You develop yellow or green discharge from your nose for over 3 days You have coughing fits  MAKE SURE YOU:  Understand these instructions Will watch your condition Will get help right away if you are not doing well or get worse  Thank you for choosing an e-visit. Your e-visit  answers were reviewed by a board certified advanced clinical practitioner to complete your personal care plan. Depending upon the condition, your plan could have included both over the counter or prescription medications. Please review your pharmacy choice. Be sure that the pharmacy you have chosen is open so that you can pick up your prescription now.  If there is a problem you may message your provider in [place] to have the prescription routed to another pharmacy. Your safety is important to [place]. If you have drug allergies check your prescription carefully.  For the next 24 hours, you can use MyChart to ask questions about today's visit, request a non-urgent call back, or ask for a work or school excuse from your e-visit provider. You will get an email in the next two days asking about your experience. I hope that your e-visit has been valuable and will speed your recovery.         ===View-only below this line===   ----- Message -----    From: Darcella Gasman    Sent: 11/17/2018 12:30 PM EDT      To: E-Visit Mailing List Subject: E-Visit Submission: Sinus Problems  E-Visit Submission: Sinus Problems --------------------------------  Question: Which of the following have you been experiencing? Answer:   Congested nose            Headache            Ear pain            Throat  pain            Cough  Question: Have these symptoms significantly worsened over the last two to three days? Answer:   Yes  Question: Describe your sore throat: Answer:   Sore throat a little ear pain nose drip with phelm collecting in throat but cannot cough up just have to clear my throat often.  Question: How long have you had a sore throat? Answer:   7 or more days  Question: Do you have any tenderness or swelling in your neck? Answer:   No  Question: Have you had any of the following? Answer:   Difficulty breathing  Question: Please enter a few details about your swallowing, breathing, or visual  problems. Answer:   On & Off sore throat with phelm collecting in throat that I could not cough up.  Runny nose.  Taking Zertec D until yesterday (out of D) so I am now taking Zertec.  Worked in yard yesterday. Today having to take occasional deep breaths but no pain in my chest.  Question: How long have you been having these symptoms? Answer:   2 days  Question: Do you have a fever? Answer:   No, I do not have a fever  Question: Do you smoke? Answer:   No  Question: Have you ever smoked? Answer:   I have never smoked  Question: Do you have any chronic illnesses, such as diabetes, heart disease, kidney disease, or lung disease, or any illness that would weaken your body's ability to fight infection? Answer:   Yes  Question: Please enter details of your other illness. Answer:   Heart Arrhythmia taking medication Atenolol for this.  Question: When you blow your nose, what color is the mucus? Answer:   Mostly clear  Question: Have you experienced similar problems in the past? Answer:   Yes  Question: What treatments have worked in the past?  Answer:   When I have had sinus infections, antibiotics have worked.  Question: What treatment(s) in the past have been unsuccessful? Answer:   doing nothing  Question: Is this illness similar to previous illnesses you have had?  How is it the same?  How is it different? Answer:   Mostly similar with runny nose, phelm, stuffy nose.  Not sure it is different.  Question: Have you recently been hospitalized? Answer:   No  Question: What medications are you currently taking for these symptoms? Answer:   Nose spray  Question: Please enter the names of any medications you are taking, or any other treatments you are trying. Answer:   Fluticasone Propionate and Zertec for Allergies  Question: Please list your medication allergies that you may have ? (If 'none' , please list as 'none') Answer:   I Have Zertec.    Zertec D works better for me but  I ran out. I have on hand a Multi-Symptom Cough, Cold & Flu which has Acetaminophen, Dextromethorphan HBr, Cough Suppressant, Guaifenesin/expectorant, Phenylephrine HCI/Nasal decongestant, but I have never taken it.  This medication is the non drowsy kind but I have never taken it.  Not sure about how it would affect my arrhythmia.  Question: Please list any additional comments  Answer:   I asked for this e-visit because this has been going on 4 weeks now and I would like some relief.  A total of 5-10 minutes was spent evaluating this patients questionnaire and formulating a plan of care.

## 2018-12-04 ENCOUNTER — Ambulatory Visit: Payer: Self-pay

## 2018-12-04 NOTE — Telephone Encounter (Signed)
Incoming call from Patient who complains of SOB.  .  Onset was 2 weeks ago.  Reports that it comes and goes.  Rates it moderate.  Reports it moderate.  Has a history of arrhythmia, on medication.  Denies lung hx.  Reports a runny nose constantly all day. Reviewed protocol with patient .  Provided care advice.  Voiced understanding.  [place] office is closed related to Patient I will forward telephone encounter to office.  [place] office will call Patient to schedule virtual visit.   Patient voiced understanding.    Reason for Disposition . [1] MODERATE longstanding difficulty breathing (e.g., speaks in phrases, SOB even at rest, pulse 100-120) AND [2] SAME as normal  Answer Assessment - Initial Assessment Questions 1. RESPIRATORY STATUS: "Describe your breathing?" (e.g., wheezing, shortness of breath, unable to speak, severe coughing)      Shortness of breath 2. ONSET: "When did this breathing problem begin?"      2 week 3. PATTERN "Does the difficult breathing come and go, or has it been constant since it started?"      Comes and goes 4. SEVERITY: "How bad is your breathing?" (e.g., mild, moderate, severe)    - MILD: No SOB at rest, mild SOB with walking, speaks normally in sentences, can lay down, no retractions, pulse < 100.    - MODERATE: SOB at rest, SOB with minimal exertion and prefers to sit, cannot lie down flat, speaks in phrases, mild retractions, audible wheezing, pulse 100-120.    - SEVERE: Very SOB at rest, speaks in single words, struggling to breathe, sitting hunched forward, retractions, pulse > 120      moderate 5. RECURRENT SYMPTOM: "Have you had difficulty breathing before?" If so, ask: "When was the last time?" and "What happened that time?"      *No Answer* 6. CARDIAC HISTORY: "Do you have any history of heart disease?" (e.g., heart attack, angina, bypass surgery, angioplasty)      arrhythmia on med 7. LUNG HISTORY: "Do you have any history of lung disease?"  (e.g., pulmonary  embolus, asthma, emphysema)    denies 8. CAUSE: "What do you think is causing the breathing problem?"      *No Answer* 9. OTHER SYMPTOMS: "Do you have any other symptoms? (e.g., dizziness, runny nose, cough, chest pain, fever)     Runny nose constantly all day 10. PREGNANCY: "Is there any chance you are pregnant?" "When was your last menstrual period?"        11. TRAVEL: "Have you traveled out of the country in the last month?" (e.g., travel history, exposures)       denies  Protocols used: BREATHING DIFFICULTY-A-AH

## 2018-12-04 NOTE — Telephone Encounter (Signed)
I have scheduled patient for a Virtual visit for Saturday.

## 2018-12-05 ENCOUNTER — Encounter: Payer: Self-pay | Admitting: Family Medicine

## 2018-12-05 ENCOUNTER — Ambulatory Visit (INDEPENDENT_AMBULATORY_CARE_PROVIDER_SITE_OTHER): Payer: Medicare HMO | Admitting: Family Medicine

## 2018-12-05 VITALS — Ht 61.0 in

## 2018-12-05 DIAGNOSIS — J301 Allergic rhinitis due to pollen: Secondary | ICD-10-CM

## 2018-12-05 DIAGNOSIS — J4520 Mild intermittent asthma, uncomplicated: Secondary | ICD-10-CM | POA: Diagnosis not present

## 2018-12-05 DIAGNOSIS — J45909 Unspecified asthma, uncomplicated: Secondary | ICD-10-CM | POA: Insufficient documentation

## 2018-12-05 MED ORDER — ALBUTEROL SULFATE HFA 108 (90 BASE) MCG/ACT IN AERS
2.0000 | INHALATION_SPRAY | Freq: Four times a day (QID) | RESPIRATORY_TRACT | 2 refills | Status: DC | PRN
Start: 2018-12-05 — End: 2019-07-21

## 2018-12-05 MED ORDER — DEXAMETHASONE 2 MG PO TABS: 2.0000 mg | ORAL_TABLET | Freq: Every day | ORAL | 0 refills | Status: AC

## 2018-12-05 NOTE — Progress Notes (Signed)
Established Patient Office Visit  Subjective:  Patient ID: Emily [name], female    DOB: 1940/11/03  Age: 78 y.o. MRN: [id number]  CC:  Chief Complaint  Patient presents with  . Shortness of Breath  . Allergies    HPI GAURI GALVAO presents for evaluation and treatment for a 2 to 3-week history of sneezing nasal congestion postnasal drip scratchy throat and cough.  Patient has been taking Zyrtec-D and Flonase.  There is been relief of some of her symptoms.  However she reports some wheezing with a tightness in her chest.  She denies frank chest pain nausea or diaphoresis.  There is been no fevers or chills or significant breathing difficulty.  She has never smoked.  She denies palpitations despite taking Zyrtec-D on an ongoing basis.  She has used an inhaler in the past with relief of her shortness of breath.  Prednisone has caused facial flushing in the past.  Past Medical History:  Diagnosis Date  . Actinic keratosis   . Allergic rhinitis   . BACK PAIN   . 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 Cervicalgia   . Cholelithiasis   . DIVERTICULOSIS, COLON   . ENDOMETRIOSIS   . GERD (gastroesophageal reflux disease)   . Heart palpitations   . Hypertension   . HYPOTHYROIDISM   . Left wrist fracture 11/06/2016   10/2016 - fell of high stool  . Pleural plaque without asbestos    CT chest 10/2010: R>L lobes, upper> lower  . Squamous cell carcinoma    History of BC    Past Surgical History:  Procedure Laterality Date  . BREAST LUMPECTOMY  2001   left  . CATARACT EXTRACTION Bilateral 09/2014   both eyes  . CHOLECYSTECTOMY  2010  . ENDOMETRIAL ABLATION    . EXCISION / BIOPSY BREAST / NIPPLE / DUCT Left 2001  . TUBAL LIGATION      Family History  Problem Relation Age of Onset  . Heart disease Father   . Emphysema Father        smoked and worked with asbestos  . Colon cancer Neg Hx     Social History   Socioeconomic  History  . Marital status: Married    Spouse name: Not on file  . Number of children: 2  . Years of education: Not on file  . Highest education level: Not on file  Occupational History  . Occupation: Retired    Comment: Chiropractor  Social Needs  . Financial resource strain: Not hard at all  . Food insecurity:    Worry: Never true    Inability: Never true  . Transportation needs:    Medical: No    Non-medical: No  Tobacco Use  . Smoking status: Never Smoker  . Smokeless tobacco: Never Used  Substance and Sexual Activity  . Alcohol use: No  . Drug use: No  . Sexual activity: Never  Lifestyle  . Physical activity:    Days per week: 0 days    Minutes per session: 0 min  . Stress: Not at all  Relationships  . Social connections:    Talks on phone: More than three times a week    Gets together: More than three times a week    Attends religious service: 1 to 4 times per year    Active member of club or organization: Yes    Attends meetings of clubs or organizations: More  than 4 times per year    Relationship status: Married  . Intimate partner violence:    Fear of current or ex partner: No    Emotionally abused: No    Physically abused: No    Forced sexual activity: No  Other Topics Concern  . Not on file  Social History Narrative   Married, lives in [place] area since 2008 from [place] to be close to dtr    Outpatient Medications Prior to Visit  Medication Sig Dispense Refill  . atenolol (TENORMIN) 25 MG tablet Take 1 tablet (25 mg total) by mouth daily. Annual appt due in January must see provider for future refills 90 tablet 1  . Calcium-Vitamin D-Vitamin K (CALCIUM + D + K) 952-841-32 MG-UNT-MCG TABS Take 1 tablet by mouth 2 (two) times daily.      Marland Kitchen levothyroxine (SYNTHROID, LEVOTHROID) 75 MCG tablet Take 1 tablet (75 mcg total) by mouth daily before breakfast. 90 tablet 1   No facility-administered medications prior to visit.     Allergies  Allergen Reactions   . Levaquin [Levofloxacin] Swelling and Other (See Comments)    Hallucinations, swelling of throat  . Nexium [Esomeprazole Magnesium]     diarrhea  . Prednisone     Facial flushing    ROS Review of Systems  Constitutional: Negative for chills, diaphoresis, fatigue, fever and unexpected weight change.  HENT: Positive for congestion, postnasal drip, rhinorrhea and sneezing. Negative for ear pain, facial swelling, hearing loss, sinus pressure, sinus pain and trouble swallowing.   Eyes: Negative.   Respiratory: Positive for cough and shortness of breath.   Cardiovascular: Negative for chest pain, palpitations and leg swelling.  Gastrointestinal: Negative.   Genitourinary: Negative.   Musculoskeletal: Negative for arthralgias and myalgias.  Skin: Negative for pallor and rash.  Allergic/Immunologic: Negative for immunocompromised state.  Neurological: Negative for headaches.  Hematological: Does not bruise/bleed easily.  Psychiatric/Behavioral: Negative.       Objective:    Physical Exam  Constitutional: She is oriented to person, place, and time. She appears well-developed and well-nourished. No distress.  HENT:  Head: Normocephalic and atraumatic.  Right Ear: External ear normal.  Left Ear: External ear normal.  Eyes: Conjunctivae are normal. Right eye exhibits no discharge. Left eye exhibits no discharge. No scleral icterus.  Neck: No JVD present. No tracheal deviation present.  Pulmonary/Chest: Effort normal. No stridor.  Neurological: She is alert and oriented to person, place, and time.  Skin: She is not diaphoretic.  Psychiatric: She has a normal mood and affect. Her behavior is normal.    Ht 5\' 1"  (1.549 m)   BMI 23.05 kg/m  Wt Readings from Last 3 Encounters:  09/16/18 122 lb (55.3 kg)  09/16/18 122 lb 12.8 oz (55.7 kg)  05/20/18 120 lb 1.3 oz (54.5 kg)     There are no preventive care reminders to display for this patient.  There are no preventive care  reminders to display for this patient.  Lab Results  Component Value Date   TSH 1.80 09/10/2018   Lab Results  Component Value Date   WBC 9.8 05/19/2018   HGB 14.7 05/19/2018   HCT 43.4 05/19/2018   MCV 89.5 05/19/2018   PLT 222.0 05/19/2018   Lab Results  Component Value Date   NA 140 05/19/2018   K 3.8 05/19/2018   CO2 28 05/19/2018   GLUCOSE 91 05/19/2018   BUN 18 05/19/2018   CREATININE 0.99 05/19/2018   BILITOT 0.4 05/19/2018  ALKPHOS 68 05/19/2018   AST 14 05/19/2018   ALT 13 05/19/2018   PROT 7.1 05/19/2018   ALBUMIN 4.2 05/19/2018   CALCIUM 9.9 05/19/2018   GFR 57.97 (L) 05/19/2018   Lab Results  Component Value Date   CHOL 238 (H) 09/12/2017   Lab Results  Component Value Date   HDL 56.90 09/12/2017   Lab Results  Component Value Date   LDLCALC 160 (H) 09/12/2017   Lab Results  Component Value Date   TRIG 106.0 09/12/2017   Lab Results  Component Value Date   CHOLHDL 4 09/12/2017   No results found for: HGBA1C    Assessment & Plan:   Problem List Items Addressed This Visit    None      No orders of the defined types were placed in this encounter.   Follow-up: No follow-ups on file.    Libby Maw, MDVirtual Visit via Video Note  I connected with ZAKYRA KUKUK on 12/05/18 at 10:20 AM EDT by a video enabled telemedicine application and verified that I am speaking with the correct person using two identifiers.   I discussed the limitations of evaluation and management by telemedicine and the availability of in person appointments. The patient expressed understanding and agreed to proceed.  History of Present Illness:    Observations/Objective:   Assessment and Plan:   Follow Up Instructions:    I discussed the assessment and treatment plan with the patient. The patient was provided an opportunity to ask questions and all were answered. The patient agreed with the plan and demonstrated an understanding of the  instructions.   The patient was advised to call back or seek an in-person evaluation if the symptoms worsen or if the condition fails to improve as anticipated.  I provided 20 minutes of non-face-to-face time during this encounter.   We will go ahead and try a short course of low-dose Decadron and albuterol inhaler for her symptoms.  Reminded her that the decongestant can irritate her heart.  She will follow-up with her primary if not improving in the next 2 to 3 days.

## 2018-12-13 ENCOUNTER — Encounter: Payer: Self-pay | Admitting: Internal Medicine

## 2018-12-14 ENCOUNTER — Encounter: Payer: Self-pay | Admitting: Internal Medicine

## 2018-12-14 ENCOUNTER — Ambulatory Visit (INDEPENDENT_AMBULATORY_CARE_PROVIDER_SITE_OTHER): Payer: Medicare HMO | Admitting: Internal Medicine

## 2018-12-14 DIAGNOSIS — J329 Chronic sinusitis, unspecified: Secondary | ICD-10-CM | POA: Insufficient documentation

## 2018-12-14 DIAGNOSIS — J301 Allergic rhinitis due to pollen: Secondary | ICD-10-CM

## 2018-12-14 DIAGNOSIS — J4520 Mild intermittent asthma, uncomplicated: Secondary | ICD-10-CM | POA: Diagnosis not present

## 2018-12-14 DIAGNOSIS — R142 Eructation: Secondary | ICD-10-CM | POA: Diagnosis not present

## 2018-12-14 DIAGNOSIS — J019 Acute sinusitis, unspecified: Secondary | ICD-10-CM | POA: Diagnosis not present

## 2018-12-14 MED ORDER — AMOXICILLIN-POT CLAVULANATE 875-125 MG PO TABS
1.0000 | ORAL_TABLET | Freq: Two times a day (BID) | ORAL | 0 refills | Status: DC
Start: 2018-12-14 — End: 2019-01-15

## 2018-12-14 MED ORDER — CETIRIZINE-PSEUDOEPHEDRINE ER 5-120 MG PO TB12: 1.0000 | ORAL_TABLET | Freq: Two times a day (BID) | ORAL | 3 refills | Status: DC

## 2018-12-14 NOTE — Assessment & Plan Note (Signed)
?   Related to gerd despite no gerd symptoms Her husband has protonix at home - she will take daily for two weeks and then stop She will let me know if there is no improvement

## 2018-12-14 NOTE — Progress Notes (Signed)
Virtual Visit via Video Note  I connected with Emily [name] on 12/14/18 at  2:45 PM EDT by a video enabled telemedicine application and verified that I am speaking with the correct person using two identifiers.   I discussed the limitations of evaluation and management by telemedicine and the availability of in person appointments. The patient expressed understanding and agreed to proceed.  The patient is currently at home and I am in the office.    No referring provider.    History of Present Illness: This is an acute visit/follow-up visit for elevated blood pressure.  3/31: She had a visit with Dr. Carlis Abbott.  Diagnosed with allergic rhinitis and advised to take Zyrtec and Flonase.  He was also experiencing some difficulty breathing.  4/18: She had an ED visit with Dr. Ethelene Hal because of difficulty breathing, belching after eating and drinking.  She was diagnosed with mild intermittent reactive airway disease and allergic rhinitis.  She was prescribed albuterol inhaler, dexamethasone.  She is still having difficulty breathing, chest congestion and today she feels lightheaded.  Her blood pressure has been higher than usual: 131/103, 118/94 and 122/106.  Her latest BP this afternoon was 102/78,   83.  She states she does have allergies for the most part year-round.  She typically takes Zyrtec-D and Flonase and usually they work, but they do not seem to be working that well this year.  She has been experiencing mild nasal congestion.  Initially when her symptoms first started she did have some ear pain and sore throat, but that has resolved.  She denies any sinus pain.  She has not had fevers or chills.  She does have some coughing and feels mucus in her throat.  On a rare occasion she has been able to bring up a little bit of mucus, but it is difficult.  She think it is clear and may be slightly yellow.  She has had very mild wheezing when she inhales at times and has felt a little short of breath.   She had a headache in the back of her head when her blood pressure was a little bit high.  The albuterol did seem to help with the wheezing and her breathing.  She is unsure if the steroids helped at all or not.  She continues to have belching after eating and drinking at times.  She denies any heartburn or nausea.   Review of Systems  Constitutional: Negative for chills and fever.  HENT: Positive for congestion (mild). Negative for ear pain (had pain in march - resolved), sinus pain and sore throat (had pain in march - resolved).        PND, mucus in throat  Respiratory: Positive for cough, sputum production (unable to get mucus up - not much), shortness of breath (improved w/ inhaler) and wheezing (occ, very faint).   Cardiovascular: Negative for chest pain and palpitations.  Gastrointestinal: Negative for heartburn.       Belching after eating or drinking  Musculoskeletal: Positive for myalgias.  Neurological: Positive for headaches (with elevated BP).      Social History   Socioeconomic History  . Marital status: Married    Spouse name: Not on file  . Number of children: 2  . Years of education: Not on file  . Highest education level: Not on file  Occupational History  . Occupation: Retired    Comment: Chiropractor  Social Needs  . Financial resource strain: Not hard at all  .  Food insecurity:    Worry: Never true    Inability: Never true  . Transportation needs:    Medical: No    Non-medical: No  Tobacco Use  . Smoking status: Never Smoker  . Smokeless tobacco: Never Used  Substance and Sexual Activity  . Alcohol use: No  . Drug use: No  . Sexual activity: Never  Lifestyle  . Physical activity:    Days per week: 0 days    Minutes per session: 0 min  . Stress: Not at all  Relationships  . Social connections:    Talks on phone: More than three times a week    Gets together: More than three times a week    Attends religious service: 1 to 4 times per year     Active member of club or organization: Yes    Attends meetings of clubs or organizations: More than 4 times per year    Relationship status: Married  Other Topics Concern  . Not on file  Social History Narrative   Married, lives in [place] area since 2008 from [place] to be close to dtr     Observations/Objective: Appears well in NAD Breathing normally  Assessment and Plan:  See Problem List for Assessment and Plan of chronic medical problems.   Follow Up Instructions:    I discussed the assessment and treatment plan with the patient. The patient was provided an opportunity to ask questions and all were answered. The patient agreed with the plan and demonstrated an understanding of the instructions.   The patient was advised to call back or seek an in-person evaluation if the symptoms worsen or if the condition fails to improve as anticipated.    Binnie Rail, MD

## 2018-12-14 NOTE — Assessment & Plan Note (Signed)
Has allergies and some of her symptoms are likely related to her allergies Continue flonase and zyrtec -D

## 2018-12-14 NOTE — Assessment & Plan Note (Signed)
?   Sinus infection Will try augmentin Continue allergy medications She will let me know if there is no improvement

## 2018-12-14 NOTE — Assessment & Plan Note (Signed)
Likely has some RAD due to allergies Albuterol inhaler has helped -- continue PRN

## 2019-01-06 ENCOUNTER — Encounter: Payer: Self-pay | Admitting: Internal Medicine

## 2019-01-15 ENCOUNTER — Encounter: Payer: Self-pay | Admitting: Internal Medicine

## 2019-01-15 ENCOUNTER — Telehealth: Payer: Self-pay

## 2019-01-15 ENCOUNTER — Ambulatory Visit (INDEPENDENT_AMBULATORY_CARE_PROVIDER_SITE_OTHER)
Admission: RE | Admit: 2019-01-15 | Discharge: 2019-01-15 | Disposition: A | Discharge status: Home / Self Care | Payer: Medicare HMO | Source: Ambulatory Visit | Attending: Internal Medicine | Admitting: Internal Medicine

## 2019-01-15 ENCOUNTER — Other Ambulatory Visit: Payer: Self-pay

## 2019-01-15 ENCOUNTER — Ambulatory Visit (INDEPENDENT_AMBULATORY_CARE_PROVIDER_SITE_OTHER): Payer: Medicare HMO | Admitting: Internal Medicine

## 2019-01-15 VITALS — BP 136/82 | HR 68 | Temp 98.7°F | Resp 16 | Ht 61.0 in | Wt 123.8 lb

## 2019-01-15 DIAGNOSIS — R0602 Shortness of breath: Secondary | ICD-10-CM | POA: Insufficient documentation

## 2019-01-15 DIAGNOSIS — J3489 Other specified disorders of nose and nasal sinuses: Secondary | ICD-10-CM

## 2019-01-15 DIAGNOSIS — R05 Cough: Secondary | ICD-10-CM | POA: Insufficient documentation

## 2019-01-15 DIAGNOSIS — R058 Other specified cough: Secondary | ICD-10-CM | POA: Insufficient documentation

## 2019-01-15 MED ORDER — IPRATROPIUM BROMIDE 0.03 % NA SOLN: 2.0000 | Freq: Two times a day (BID) | NASAL | 3 refills | Status: DC

## 2019-01-15 NOTE — Assessment & Plan Note (Signed)
Discussed that her shortness of breath could be from many things.  It sounds like her shortness of breath last night could have been restrictive in nature and this morning may have been anxiety related In the past couple of months it may have been reactive airway disease She denies GERD and has no cold symptoms There is no cough or wheeze We will get a chest x-ray Discussed that if she has this sensation to sit straight up and try to take several deep breaths Discussed this could be anxiety related and to try to take several deep breaths if this happens to try to calm herself Can use the albuterol as needed if it seems to help Refer to pulmonary for further evaluation of lungs and shortness of breath

## 2019-01-15 NOTE — Telephone Encounter (Signed)
Called pt to get appointment scheduled. Went over screening which was ok minus SOB. Ok to have in office appt per Dr. Quay Burow.

## 2019-01-15 NOTE — Assessment & Plan Note (Signed)
Chronic Zyrtec daily and Flonase did not seem to help Discontinue both Trial of ipratropium nasal spray

## 2019-01-15 NOTE — Patient Instructions (Signed)
Have a chest xray today.  Tests ordered today. Your results will be released to [facility] (or called to you) after review, usually within 72hours after test completion.     Medications reviewed and updated.  Changes include :   Try a different nasal spray.  Stop the flonase and zyrtec.   Your prescription(s) have been submitted to your pharmacy. Please take as directed and contact our office if you believe you are having problem(s) with the medication(s).  A referral was ordered for pulmonary

## 2019-01-15 NOTE — Progress Notes (Signed)
Subjective:    Patient ID: Emily [name], female    DOB: [birth year], 78 y.o.   MRN: [id number]  HPI The patient is here for an acute visit for shortness of breath.  I last saw her 1 month ago and she was experiencing shortness of breath.  She has been seen a few times for this and it was unclear if some of it was related to allergies, GERD or possible sinus infection.  I did prescribe her Augmentin and she states after taking that she felt great.  She has not needed to use the albuterol.  She was taking Zyrtec daily and for a while was taking Flonase, but it started causing nosebleeds and she stopped it.  She did restart it this week.  She restarted the Flonase because she has rhinorrhea that is fairly constant.  She states that the Zyrtec and the Flonase did not seem to help.  Yesterday she had an increased amount of runny nose and she took the Zyrtec twice yesterday.  Last night after dinner up until around midnight she was experiencing shortness of breath.  She states it started when she was sitting in her recliner and her chest was a little bit scrunched up.  She has pectus excavatum and she wondered if this was contributing.  After she started feeling short of breath she went to bed and after while laying there for a while she did feel better and was able to fall asleep.  She woke up this morning and felt fine initially-shower and get ready.  She then started to feel short of breath.  She did not have any wheezing or coughing at the time.  She thinks this may have increased her anxiety because her hand was shaking.  She used the albuterol at that time and it did help, but took about an hour.  She denies any GERD symptoms.  She has not had any fevers, chills or cold symptoms.  Medications and allergies reviewed with patient and updated if appropriate.  Patient Active Problem List   Diagnosis Date Noted  . Belching 12/14/2018  . Sinus infection 12/14/2018  . Reactive airway disease  12/05/2018  . Screening for colon cancer 09/16/2018  . Pre-syncope 05/19/2018  . DDD (degenerative disc disease), cervical 01/15/2018  . Neuralgia 09/12/2017  . Memory difficulties 11/06/2016  . IBS (irritable bowel syndrome) 04/16/2016  . Hyperlipidemia 09/10/2015  . Osteopenia 09/08/2015  . Palpitations   . Pleural plaque without asbestos 12/03/2010  . Cervicalgia 12/18/2009  . DIVERTICULOSIS, COLON 03/02/2009  . DYSPLASTIC NEVUS, [place] 12/06/2008  . Lumbago 03/11/2008  . Actinic keratosis 01/23/2008  . CARCINOMA, BREAST, ESTROGEN RECEPTOR NEGATIVE 10/12/2007  . Hypothyroidism 06/18/2007  . Allergic rhinitis 06/11/2007    Current Outpatient Medications on File Prior to Visit  Medication Sig Dispense Refill  . albuterol (VENTOLIN HFA) 108 (90 Base) MCG/ACT inhaler Inhale 2 puffs into the lungs every 6 (six) hours as needed for wheezing or shortness of breath. 1 Inhaler 2  . atenolol (TENORMIN) 25 MG tablet Take 1 tablet (25 mg total) by mouth daily. Annual appt due in January must see provider for future refills 90 tablet 1  . Calcium-Vitamin D-Vitamin K (CALCIUM + D + K) 151-761-60 MG-UNT-MCG TABS Take 1 tablet by mouth 2 (two) times daily.      . cetirizine-pseudoephedrine (ZYRTEC-D ALLERGY & CONGESTION) 5-120 MG tablet Take 1 tablet by mouth 2 (two) times daily. 180 tablet 3  . levothyroxine (SYNTHROID, LEVOTHROID) 75 MCG  tablet Take 1 tablet (75 mcg total) by mouth daily before breakfast. 90 tablet 1   No current facility-administered medications on file prior to visit.     Past Medical History:  Diagnosis Date  . Actinic keratosis   . Allergic rhinitis   . BACK PAIN   . 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 Cervicalgia   . Cholelithiasis   . DIVERTICULOSIS, COLON   . ENDOMETRIOSIS   . GERD (gastroesophageal reflux disease)   . Heart palpitations   . Hypertension   . HYPOTHYROIDISM   . Left wrist fracture 11/06/2016    10/2016 - fell of high stool  . Pleural plaque without asbestos    CT chest 10/2010: R>L lobes, upper> lower  . Squamous cell carcinoma    History of BC    Past Surgical History:  Procedure Laterality Date  . BREAST LUMPECTOMY  2001   left  . CATARACT EXTRACTION Bilateral 09/2014   both eyes  . CHOLECYSTECTOMY  2010  . ENDOMETRIAL ABLATION    . EXCISION / BIOPSY BREAST / NIPPLE / DUCT Left 2001  . TUBAL LIGATION      Social History   Socioeconomic History  . Marital status: Married    Spouse name: Not on file  . Number of children: 2  . Years of education: Not on file  . Highest education level: Not on file  Occupational History  . Occupation: Retired    Comment: Chiropractor  Social Needs  . Financial resource strain: Not hard at all  . Food insecurity:    Worry: Never true    Inability: Never true  . Transportation needs:    Medical: No    Non-medical: No  Tobacco Use  . Smoking status: Never Smoker  . Smokeless tobacco: Never Used  Substance and Sexual Activity  . Alcohol use: No  . Drug use: No  . Sexual activity: Never  Lifestyle  . Physical activity:    Days per week: 0 days    Minutes per session: 0 min  . Stress: Not at all  Relationships  . Social connections:    Talks on phone: More than three times a week    Gets together: More than three times a week    Attends religious service: 1 to 4 times per year    Active member of club or organization: Yes    Attends meetings of clubs or organizations: More than 4 times per year    Relationship status: Married  Other Topics Concern  . Not on file  Social History Narrative   Married, lives in [place] area since 2008 from [place] to be close to dtr    Family History  Problem Relation Age of Onset  . Heart disease Father   . Emphysema Father        smoked and worked with asbestos  . Colon cancer Neg Hx     Review of Systems  Constitutional: Negative for fever.  HENT: Positive for rhinorrhea.  Negative for congestion, ear pain, postnasal drip, sinus pressure, sneezing and sore throat.   Eyes: Negative for itching.  Respiratory: Positive for shortness of breath. Negative for cough and wheezing.   Neurological: Negative for light-headedness and headaches.       Objective:   Vitals:   01/15/19 1432  BP: 136/82  Pulse: 68  Resp: 16  Temp: 98.7 F (37.1 C)  SpO2: 97%   BP Readings from  Last 3 Encounters:  01/15/19 136/82  09/16/18 118/82  09/16/18 118/82   Wt Readings from Last 3 Encounters:  01/15/19 123 lb 12.8 oz (56.2 kg)  09/16/18 122 lb (55.3 kg)  09/16/18 122 lb 12.8 oz (55.7 kg)   Body mass index is 23.39 kg/m.   Physical Exam    GENERAL APPEARANCE: Appears stated age, well appearing, NAD EYES: conjunctiva clear, no icterus HEENT: bilateral tympanic membranes and ear canals normal, oropharynx with no erythema, no thyromegaly, trachea midline, no cervical or supraclavicular lymphadenopathy LUNGS/chest: Pectus excavatum.  Clear to auscultation without wheeze or crackles, unlabored breathing, good air entry bilaterally CARDIOVASCULAR: Normal S1,S2 without murmurs, no edema SKIN: Warm, dry PSYCH: Normal mood and affect     Assessment & Plan:    See Problem List for Assessment and Plan of chronic medical problems.

## 2019-01-15 NOTE — Telephone Encounter (Signed)
Copied from [place] [id number]. Topic: Appointment Scheduling - Scheduling Inquiry for Clinic >> Jan 15, 2019  9:33 AM Scherrie Gerlach wrote: Reason for CRM: pt states she would like an in office visit asap with Dr Quay Burow.  Pt has had 3 tele visits and still having the same issues with breathing.  This has been ongoing some time.  Now pt has concerns that the meds she is now taking are not good for her with all her other health issues, and that could be causing problems as well.  Pt states Dr Quay Burow did tell her she could see her in office at one point. Pt states she got some better after abx, but now it gone and everything right back. Please advise, and pt would like call back asap please.

## 2019-01-17 ENCOUNTER — Encounter: Payer: Self-pay | Admitting: Internal Medicine

## 2019-01-29 DIAGNOSIS — L820 Inflamed seborrheic keratosis: Secondary | ICD-10-CM | POA: Diagnosis not present

## 2019-01-29 DIAGNOSIS — L7211 Pilar cyst: Secondary | ICD-10-CM | POA: Diagnosis not present

## 2019-01-29 DIAGNOSIS — L821 Other seborrheic keratosis: Secondary | ICD-10-CM | POA: Diagnosis not present

## 2019-02-06 ENCOUNTER — Other Ambulatory Visit: Payer: Self-pay

## 2019-02-06 ENCOUNTER — Ambulatory Visit
Admission: RE | Admit: 2019-02-06 | Discharge: 2019-02-06 | Disposition: A | Discharge status: Home / Self Care | Payer: Medicare HMO | Source: Ambulatory Visit | Attending: Obstetrics and Gynecology | Admitting: Obstetrics and Gynecology

## 2019-02-06 DIAGNOSIS — Z1231 Encounter for screening mammogram for malignant neoplasm of breast: Secondary | ICD-10-CM

## 2019-02-06 HISTORY — DX: Personal history of irradiation: Z92.3

## 2019-02-06 HISTORY — DX: Personal history of antineoplastic chemotherapy: Z92.21

## 2019-03-15 ENCOUNTER — Encounter: Payer: Self-pay | Admitting: Gastroenterology

## 2019-03-24 DIAGNOSIS — J309 Allergic rhinitis, unspecified: Secondary | ICD-10-CM | POA: Diagnosis not present

## 2019-03-24 DIAGNOSIS — Z8249 Family history of ischemic heart disease and other diseases of the circulatory system: Secondary | ICD-10-CM | POA: Diagnosis not present

## 2019-03-24 DIAGNOSIS — I4891 Unspecified atrial fibrillation: Secondary | ICD-10-CM | POA: Diagnosis not present

## 2019-03-24 DIAGNOSIS — Z853 Personal history of malignant neoplasm of breast: Secondary | ICD-10-CM | POA: Diagnosis not present

## 2019-03-24 DIAGNOSIS — Z85828 Personal history of other malignant neoplasm of skin: Secondary | ICD-10-CM | POA: Diagnosis not present

## 2019-03-24 DIAGNOSIS — E039 Hypothyroidism, unspecified: Secondary | ICD-10-CM | POA: Diagnosis not present

## 2019-04-09 ENCOUNTER — Other Ambulatory Visit: Payer: Self-pay | Admitting: Internal Medicine

## 2019-04-12 ENCOUNTER — Other Ambulatory Visit: Payer: Self-pay | Admitting: Internal Medicine

## 2019-04-21 DIAGNOSIS — M13812 Other specified arthritis, left shoulder: Secondary | ICD-10-CM | POA: Diagnosis not present

## 2019-04-21 DIAGNOSIS — M25512 Pain in left shoulder: Secondary | ICD-10-CM | POA: Diagnosis not present

## 2019-04-21 DIAGNOSIS — M75102 Unspecified rotator cuff tear or rupture of left shoulder, not specified as traumatic: Secondary | ICD-10-CM | POA: Diagnosis not present

## 2019-04-21 DIAGNOSIS — M13819 Other specified arthritis, unspecified shoulder: Secondary | ICD-10-CM | POA: Diagnosis not present

## 2019-04-23 ENCOUNTER — Other Ambulatory Visit: Payer: Self-pay

## 2019-04-23 ENCOUNTER — Encounter: Payer: Self-pay | Admitting: Internal Medicine

## 2019-04-23 DIAGNOSIS — Z20822 Contact with and (suspected) exposure to covid-19: Secondary | ICD-10-CM

## 2019-04-23 DIAGNOSIS — R6889 Other general symptoms and signs: Secondary | ICD-10-CM | POA: Diagnosis not present

## 2019-04-24 LAB — NOVEL CORONAVIRUS, NAA: SARS-CoV-2, NAA: NOT DETECTED

## 2019-04-28 DIAGNOSIS — L080 Pyoderma: Secondary | ICD-10-CM | POA: Diagnosis not present

## 2019-04-28 DIAGNOSIS — D485 Neoplasm of uncertain behavior of skin: Secondary | ICD-10-CM | POA: Diagnosis not present

## 2019-05-04 ENCOUNTER — Other Ambulatory Visit: Payer: Self-pay | Admitting: Internal Medicine

## 2019-05-05 ENCOUNTER — Other Ambulatory Visit: Payer: Self-pay | Admitting: Internal Medicine

## 2019-05-12 ENCOUNTER — Ambulatory Visit: Payer: Medicare HMO

## 2019-05-12 ENCOUNTER — Ambulatory Visit: Payer: Medicare HMO | Admitting: Internal Medicine

## 2019-05-17 NOTE — Progress Notes (Signed)
Subjective:    Patient ID: Emily [name], female    DOB: [birth year], 78 y.o.   MRN: [id number]  HPI The patient is here for an acute visit.  She has had allergies since March.  Two sundays ago she had worsened allergy symtpoms - it felt more like a cold.  She was not sure if it was her allergies or it was a cold or sinus infection.  Since March she has been taking Zyrtec-D and using the ipratropium nasal spray.  She feels like that has helped.  On occasion she will use the albuterol inhaler and that does help.  She states occasional chills, nasal congestion, increased bags under her eyes, sinus headaches in the frontal region,Little wheezing at times and shortness of breath.  The shortness of breath is chronic and unchanged.  The albuterol inhaler does help.  She is also had some loose stools, but denies diarrhea, abdominal pain or nausea.  She has not had any fever, but typically does not get fevers.  She denies ear pain, facial sinus pain, sore throat, cough and lightheadedness/dizziness.  Shortness of breath: She has been experiencing some intermittent shortness of breath since March.  She did have a chest x-ray over the summer and it was unchanged from the previous chest x-ray.  She does have some chronic lung changes consistent with asbestosis.  She did see pulmonary in 2012 and at that time PFTs were normal.  She notices the shortness of breath mostly with exertion.   Medications and allergies reviewed with patient and updated if appropriate.  Patient Active Problem List   Diagnosis Date Noted  . SOB (shortness of breath) 01/15/2019  . Rhinorrhea 01/15/2019  . Belching 12/14/2018  . Sinus infection 12/14/2018  . Reactive airway disease 12/05/2018  . Screening for colon cancer 09/16/2018  . Pre-syncope 05/19/2018  . DDD (degenerative disc disease), cervical 01/15/2018  . Neuralgia 09/12/2017  . Memory difficulties 11/06/2016  . IBS (irritable bowel syndrome) 04/16/2016  .  Hyperlipidemia 09/10/2015  . Osteopenia 09/08/2015  . Palpitations   . Pleural plaque without asbestos 12/03/2010  . Cervicalgia 12/18/2009  . DIVERTICULOSIS, COLON 03/02/2009  . DYSPLASTIC NEVUS, [place] 12/06/2008  . Lumbago 03/11/2008  . Actinic keratosis 01/23/2008  . CARCINOMA, BREAST, ESTROGEN RECEPTOR NEGATIVE 10/12/2007  . Hypothyroidism 06/18/2007  . Allergic rhinitis 06/11/2007    Current Outpatient Medications on File Prior to Visit  Medication Sig Dispense Refill  . albuterol (VENTOLIN HFA) 108 (90 Base) MCG/ACT inhaler Inhale 2 puffs into the lungs every 6 (six) hours as needed for wheezing or shortness of breath. 1 Inhaler 2  . atenolol (TENORMIN) 25 MG tablet Take 1 tablet by mouth daily. 90 tablet 1  . Calcium-Vitamin D-Vitamin K (CALCIUM + D + K) [id number] MG-UNT-MCG TABS Take 1 tablet by mouth 2 (two) times daily.      . cetirizine-pseudoephedrine (ZYRTEC-D ALLERGY & CONGESTION) 5-120 MG tablet Take 1 tablet by mouth 2 (two) times daily. 180 tablet 3  . ipratropium (ATROVENT) 0.03 % nasal spray INHALE 2 SPRAYS IN EACH NOSTRIL EVERY 12 HOURS 90 mL 1  . levothyroxine (SYNTHROID) 75 MCG tablet TAKE 1 TABLET BY MOUTH EVERY DAY 90 tablet 1   No current facility-administered medications on file prior to visit.     Past Medical History:  Diagnosis Date  . Actinic keratosis   . Allergic rhinitis   . BACK PAIN   . CARCINOMA, BREAST, 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   L s/p lumpectomy, chemo  and XRT  . Cervicalgia   . Cholelithiasis   . DIVERTICULOSIS, COLON   . ENDOMETRIOSIS   . GERD (gastroesophageal reflux disease)   . Heart palpitations   . Hypertension   . HYPOTHYROIDISM   . Left wrist fracture 11/06/2016   10/2016 - fell of high stool  . Personal history of chemotherapy   . Personal history of radiation therapy   . Pleural plaque without asbestos    CT chest 10/2010: R>L lobes, upper> lower  . Squamous cell carcinoma    History of BC    Past Surgical  History:  Procedure Laterality Date  . BREAST LUMPECTOMY  2001   left  . CATARACT EXTRACTION Bilateral 09/2014   both eyes  . CHOLECYSTECTOMY  2010  . ENDOMETRIAL ABLATION    . EXCISION / BIOPSY BREAST / NIPPLE / DUCT Left 2001  . TUBAL LIGATION      Social History   Socioeconomic History  . Marital status: Married    Spouse name: Not on file  . Number of children: 2  . Years of education: Not on file  . Highest education level: Not on file  Occupational History  . Occupation: Retired    Comment: Chiropractor  Social Needs  . Financial resource strain: Not hard at all  . Food insecurity    Worry: Never true    Inability: Never true  . Transportation needs    Medical: No    Non-medical: No  Tobacco Use  . Smoking status: Never Smoker  . Smokeless tobacco: Never Used  Substance and Sexual Activity  . Alcohol use: No  . Drug use: No  . Sexual activity: Never  Lifestyle  . Physical activity    Days per week: 0 days    Minutes per session: 0 min  . Stress: Not at all  Relationships  . Social connections    Talks on phone: More than three times a week    Gets together: More than three times a week    Attends religious service: 1 to 4 times per year    Active member of club or organization: Yes    Attends meetings of clubs or organizations: More than 4 times per year    Relationship status: Married  Other Topics Concern  . Not on file  Social History Narrative   Married, lives in [place] area since 2008 from [place] to be close to dtr    Family History  Problem Relation Age of Onset  . Heart disease Father   . Emphysema Father        smoked and worked with asbestos  . Colon cancer Neg Hx     Review of Systems  Constitutional: Positive for chills. Negative for fever.  HENT: Positive for congestion and facial swelling (mild, under eyes). Negative for ear pain, sinus pressure, sinus pain and sore throat.   Respiratory: Positive for shortness of breath  (chronic, no change, albuterol helps) and wheezing (a little at times). Negative for cough.   Gastrointestinal: Negative for abdominal pain, diarrhea (loose stools) and nausea.  Neurological: Positive for headaches (frontal headache). Negative for dizziness and light-headedness.       Objective:   Vitals:   05/18/19 1014  BP: 140/80  Pulse: 63  Resp: 16  Temp: 97.8 F (36.6 C)  SpO2: 98%   BP Readings from Last 3 Encounters:  05/18/19 140/80  01/15/19 136/82  09/16/18 118/82   Wt Readings from Last 3 Encounters:  05/18/19 121  lb (54.9 kg)  01/15/19 123 lb 12.8 oz (56.2 kg)  09/16/18 122 lb (55.3 kg)   Body mass index is 22.86 kg/m.   Physical Exam    GENERAL APPEARANCE: Appears stated age, well appearing, NAD EYES: conjunctiva clear, no icterus HEENT: bilateral tympanic membranes and ear canals normal, oropharynx with no erythema, no thyromegaly, trachea midline, no cervical or supraclavicular lymphadenopathy LUNGS: Clear to auscultation without wheeze or crackles, unlabored breathing, good air entry bilaterally CARDIOVASCULAR: Normal S1,S2 without murmurs, no edema SKIN: Warm, dry      Assessment & Plan:    See Problem List for Assessment and Plan of chronic medical problems.

## 2019-05-18 ENCOUNTER — Encounter: Payer: Self-pay | Admitting: Internal Medicine

## 2019-05-18 ENCOUNTER — Ambulatory Visit (INDEPENDENT_AMBULATORY_CARE_PROVIDER_SITE_OTHER): Payer: Medicare HMO | Admitting: Internal Medicine

## 2019-05-18 ENCOUNTER — Other Ambulatory Visit: Payer: Self-pay

## 2019-05-18 VITALS — BP 140/80 | HR 63 | Temp 97.8°F | Resp 16 | Ht 61.0 in | Wt 121.0 lb

## 2019-05-18 DIAGNOSIS — J301 Allergic rhinitis due to pollen: Secondary | ICD-10-CM | POA: Diagnosis not present

## 2019-05-18 DIAGNOSIS — R0602 Shortness of breath: Secondary | ICD-10-CM | POA: Diagnosis not present

## 2019-05-18 DIAGNOSIS — J929 Pleural plaque without asbestos: Secondary | ICD-10-CM | POA: Diagnosis not present

## 2019-05-18 MED ORDER — MONTELUKAST SODIUM 10 MG PO TABS: 10.0000 mg | ORAL_TABLET | Freq: Every day | ORAL | 3 refills | Status: DC

## 2019-05-18 NOTE — Assessment & Plan Note (Signed)
PFTs normal 12/2010 Chest x-ray within the last year stable compared to previous She has been having some shortness of breath over the last few months-improved with albuterol Will refer to pulmonary for possible PFTs and further evaluation Since we are adding Singulair today we will hold off on adding a maintenance inhaler-if Singulair is not effective we will try maintenance inhaler-she would like to try one thing at a time

## 2019-05-18 NOTE — Assessment & Plan Note (Signed)
Think her symptoms are likely either allergies or possibly viral sinus infection/URI Continue Zyrtec-D, ipratropium nasal spray We will try adding Singulair to see if that helps If there is no improvement she will let me know

## 2019-05-18 NOTE — Assessment & Plan Note (Signed)
?    Pulmonary, related to allergic rhinitis, cardiac cause seems less likely Will refer to pulmonary for possible PFTs and further evaluation Further evaluation depends on pulmonary evaluation-she did see pulmonary in 2012 for shortness of breath

## 2019-05-18 NOTE — Patient Instructions (Signed)
  Medications reviewed and updated.  Changes include :   sinuglair at night  Your prescription(s) have been submitted to your pharmacy. Please take as directed and contact our office if you believe you are having problem(s) with the medication(s).  A referral was ordered for  Pulmonary   If the above medication does not help we can try a different type of inhaler.

## 2019-05-20 ENCOUNTER — Encounter: Payer: Self-pay | Admitting: Internal Medicine

## 2019-05-21 ENCOUNTER — Other Ambulatory Visit: Payer: Self-pay

## 2019-05-21 ENCOUNTER — Ambulatory Visit: Payer: Medicare HMO

## 2019-05-21 MED ORDER — AMOXICILLIN 500 MG PO CAPS: 500.0000 mg | ORAL_CAPSULE | Freq: Three times a day (TID) | ORAL | 0 refills | Status: DC

## 2019-05-21 NOTE — Telephone Encounter (Signed)
Pts current sxs are productive cough, congestion, and SOB. No fever, chills, body aches or other flu like sxs.

## 2019-05-21 NOTE — Telephone Encounter (Signed)
Pt would prefer amoxicillin.

## 2019-05-21 NOTE — Telephone Encounter (Signed)
Lets try an antibiotic.  omnicef is pending if she is ok with that

## 2019-05-21 NOTE — Telephone Encounter (Signed)
Copied from [place] [id number]. Topic: General - Other >> May 20, 2019 12:02 PM Keene Breath wrote: Reason for CRM: Patient would like for the nurse to call her because she coughed up yellow mucus and she thinks she might have an infection.  Please advise and call patient to discuss.  CB# [phone]

## 2019-05-27 ENCOUNTER — Encounter: Payer: Self-pay | Admitting: Internal Medicine

## 2019-05-27 NOTE — Telephone Encounter (Signed)
Patient calling to check the status of the message below. States that she would like to know if she can add something like Mucinex to take with the amoxicillin? Please advise.  CB#: [phone]

## 2019-06-03 ENCOUNTER — Encounter: Payer: Self-pay | Admitting: Internal Medicine

## 2019-06-09 ENCOUNTER — Other Ambulatory Visit: Payer: Self-pay

## 2019-06-09 ENCOUNTER — Ambulatory Visit (INDEPENDENT_AMBULATORY_CARE_PROVIDER_SITE_OTHER): Payer: Medicare HMO

## 2019-06-09 DIAGNOSIS — Z23 Encounter for immunization: Secondary | ICD-10-CM

## 2019-06-10 DIAGNOSIS — R69 Illness, unspecified: Secondary | ICD-10-CM | POA: Diagnosis not present

## 2019-06-16 ENCOUNTER — Ambulatory Visit: Payer: Medicare HMO | Admitting: Internal Medicine

## 2019-06-16 ENCOUNTER — Telehealth: Payer: Self-pay | Admitting: Internal Medicine

## 2019-06-16 ENCOUNTER — Encounter: Payer: Self-pay | Admitting: Internal Medicine

## 2019-06-16 ENCOUNTER — Other Ambulatory Visit: Payer: Self-pay

## 2019-06-16 DIAGNOSIS — R059 Cough, unspecified: Secondary | ICD-10-CM

## 2019-06-16 DIAGNOSIS — R05 Cough: Secondary | ICD-10-CM | POA: Diagnosis not present

## 2019-06-16 DIAGNOSIS — J929 Pleural plaque without asbestos: Secondary | ICD-10-CM | POA: Diagnosis not present

## 2019-06-16 DIAGNOSIS — R058 Other specified cough: Secondary | ICD-10-CM

## 2019-06-16 LAB — CBC WITH DIFFERENTIAL/PLATELET
Basophils Absolute: 0.1 10*3/uL (ref 0.0–0.1)
Basophils Relative: 0.4 % (ref 0.0–3.0)
Eosinophils Absolute: 0.2 10*3/uL (ref 0.0–0.7)
Eosinophils Relative: 1.5 % (ref 0.0–5.0)
HCT: 43.1 % (ref 36.0–46.0)
Hemoglobin: 14 g/dL (ref 12.0–15.0)
Lymphocytes Relative: 21.2 % (ref 12.0–46.0)
Lymphs Abs: 2.8 10*3/uL (ref 0.7–4.0)
MCHC: 32.5 g/dL (ref 30.0–36.0)
MCV: 89.8 fl (ref 78.0–100.0)
Monocytes Absolute: 1.1 10*3/uL — ABNORMAL HIGH (ref 0.1–1.0)
Monocytes Relative: 8 % (ref 3.0–12.0)
Neutro Abs: 9.1 10*3/uL — ABNORMAL HIGH (ref 1.4–7.7)
Neutrophils Relative %: 68.9 % (ref 43.0–77.0)
Platelets: 219 10*3/uL (ref 150.0–400.0)
RBC: 4.8 Mil/uL (ref 3.87–5.11)
RDW: 14 % (ref 11.5–15.5)
WBC: 13.2 10*3/uL — ABNORMAL HIGH (ref 4.0–10.5)

## 2019-06-16 MED ORDER — BENZONATATE 200 MG PO CAPS: 200.0000 mg | ORAL_CAPSULE | Freq: Three times a day (TID) | ORAL | 1 refills | Status: DC | PRN

## 2019-06-16 MED ORDER — METHYLPREDNISOLONE ACETATE 80 MG/ML IJ SUSP
120.0000 mg | Freq: Once | INTRAMUSCULAR | Status: AC
  Administered 2019-06-16: 120 mg via INTRAMUSCULAR

## 2019-06-16 MED ORDER — RABEPRAZOLE SODIUM 20 MG PO TBEC: 20.0000 mg | DELAYED_RELEASE_TABLET | Freq: Every day | ORAL | 2 refills | Status: DC

## 2019-06-16 MED ORDER — FAMOTIDINE 20 MG PO TABS: ORAL_TABLET | ORAL | 11 refills | Status: DC

## 2019-06-16 NOTE — Progress Notes (Signed)
Emily [name], female    DOB: 1940/11/10,   MRN: [id number]   Brief patient profile:  81 yowf never smoker placed  on inhaler around 2005 for sob  but only took it a few weeks and the problem did not recur then onset of watery rhinitis and sneezing but  perennial x around 2014 p [place] to present house in [facility] 2012 and started zyrtec d and helped some but still daily symptoms but much worse spring 2020  And assoc with sob and need to clear the throat  And sob better on saba but not the urge to clear throat so referred to pulmonary clinic 06/16/2019 by Emily [name]   Has asbestos exp since age [age] in her home    History of Present Illness  06/16/2019  Pulmonary/ 1st office eval/Emily [name]  Chief Complaint  Patient presents with   Pulmonary Consult    Referred by Emily. Billey [name]. Pt c/o SOB since April 2020. She states SOB comes and goes and is associated with her PND.   Dyspnea:  Assoc with cough and urge to clear throat some better with saba  Walking up to a mile outside some hills  Cough: urge to throat / can't get mucus up, feels globus sensation - did bring up slt yellow mucus resolved on amox but not the globus sensation Sleep: flat on side one pillow  SABA use: twice weekly helps some  Now having overt hb/ belching started well p the chronic cough    No obvious day to day or daytime variability or assoc excess/ purulent sputum or mucus plugs or hemoptysis or cp or chest tightness, subjective wheeze or overt sinus or hb symptoms.   Sleeping  without nocturnal  or early am exacerbation  of respiratory  c/o's or need for noct saba. Also denies any obvious fluctuation of symptoms with weather or environmental changes or other aggravating or alleviating factors except as outlined above   No unusual exposure hx or h/o childhood pna/ asthma or knowledge of premature birth.  Current Allergies, Complete Past Medical History, Past Surgical  History, Family History, and Social History were reviewed in [employer] record.  ROS  The following are not active complaints unless bolded Hoarseness, sore throat, dysphagia, dental problems, itching, sneezing,  nasal congestion or discharge of excess mucus or purulent secretions, ear ache,   fever, chills, sweats, unintended wt loss or wt gain, classically pleuritic or exertional cp,  orthopnea pnd or arm/hand swelling  or leg swelling, presyncope, palpitations, abdominal pain, anorexia, nausea, vomiting, diarrhea  or change in bowel habits or change in bladder habits, change in stools or change in urine, dysuria, hematuria,  rash, arthralgias, visual complaints, headache, numbness, weakness or ataxia or problems with walking or coordination,  change in mood or  memory.           Past Medical History:  Diagnosis Date   Actinic keratosis    Allergic rhinitis    BACK PAIN    CARCINOMA, BREAST, 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   L s/p lumpectomy, chemo and XRT   Cervicalgia    Cholelithiasis    DIVERTICULOSIS, COLON    ENDOMETRIOSIS    GERD (gastroesophageal reflux disease)    Heart palpitations    Hypertension    HYPOTHYROIDISM    Left wrist fracture 11/06/2016   10/2016 - fell of high stool   Personal history of chemotherapy    Personal history  of radiation therapy    Pleural plaque without asbestos    CT chest 10/2010: R>L lobes, upper> lower   Squamous cell carcinoma    History of BC    Outpatient Medications Prior to Visit  Medication Sig Dispense Refill   albuterol (VENTOLIN HFA) 108 (90 Base) MCG/ACT inhaler Inhale 2 puffs into the lungs every 6 (six) hours as needed for wheezing or shortness of breath. 1 Inhaler 2   atenolol (TENORMIN) 25 MG tablet Take 1 tablet by mouth daily. 90 tablet 1   Calcium-Vitamin D-Vitamin K (CALCIUM + D + K) 750-500-40 MG-UNT-MCG TABS Take 1 tablet by mouth 2 (two) times daily.        cetirizine-pseudoephedrine (ZYRTEC-D ALLERGY & CONGESTION) 5-120 MG tablet Take 1 tablet by mouth 2 (two) times daily. 180 tablet 3   ipratropium (ATROVENT) 0.03 % nasal spray INHALE 2 SPRAYS IN EACH NOSTRIL EVERY 12 HOURS 90 mL 1   levothyroxine (SYNTHROID) 75 MCG tablet TAKE 1 TABLET BY MOUTH EVERY DAY 90 tablet 1   montelukast (SINGULAIR) 10 MG tablet Take 1 tablet (10 mg total) by mouth at bedtime. 30 tablet 3   amoxicillin (AMOXIL) 500 MG capsule Take 1 capsule (500 mg total) by mouth 3 (three) times daily. 30 capsule 0      Objective:     BP 128/84 (BP Location: Left Arm, Cuff Size: Normal)    Pulse 72    Temp (!) 97.5 F (36.4 C) (Temporal)    Ht 5' 0.63" (1.54 m)    Wt 122 lb (55.3 kg)    SpO2 95% Comment: on RA   BMI 23.33 kg/m   SpO2: 95 %(on RA)   Pleasant amb wf freq throat clearing but dry sounding cough    HEENT : pt wearing mask not removed for exam due to covid -19 concerns.    NECK :  without JVD/Nodes/TM/ nl carotid upstrokes bilaterally   LUNGS: no acc muscle use,  Nl contour chest which is clear to A and P bilaterally without cough on insp or exp maneuvers   CV:  RRR  no s3 or murmur or increase in P2, and no edema   ABD:  soft and nontender with nl inspiratory excursion in the supine position. No bruits or organomegaly appreciated, bowel sounds nl  MS:  Nl gait/ ext warm without deformities, calf tenderness, cyanosis or clubbing No obvious joint restrictions   SKIN: warm and dry without lesions    NEURO:  alert, approp, nl sensorium with  no motor or cerebellar deficits apparent.     I personally reviewed images and agree with radiology impression as follows:  CXR:   01/15/2019  No acute cardiopulmonary findings. Chronic findings similar to CT 11/08/2010   Labs ordered 06/16/2019  :  allergy profile          Assessment   Upper airway cough syndrome Onset spring [id number]  - cyclical cough rx wth tesalon/ 1st gen H1 blockers per guidelines  -  Allergy profile 06/16/2019 >  Eos 0.2 /  IgE    - Sinus CT ordered  The most common causes of chronic cough in immunocompetent adults include the following: upper airway cough syndrome (UACS), previously referred to as postnasal drip syndrome (PNDS), which is caused by variety of rhinosinus conditions; (2) asthma; (3) GERD; (4) chronic bronchitis from cigarette smoking or other inhaled environmental irritants; (5) nonasthmatic eosinophilic bronchitis; and (6) bronchiectasis.   These conditions, singly or in combination, have accounted for up  to 94% of the causes of chronic cough in prospective studies.   Other conditions have constituted no >6% of the causes in prospective studies These have included bronchogenic carcinoma, chronic interstitial pneumonia, sarcoidosis, left ventricular failure, ACEI-induced cough, and aspiration from a condition associated with pharyngeal dysfunction.    Chronic cough is often simultaneously caused by more than one condition. A single cause has been found from 38 to 82% of the time, multiple causes from 18 to 62%. Multiply caused cough has been the result of three diseases up to 42% of the time.       Most likely this is Upper airway cough syndrome (previously labeled PNDS),  is so named because it's frequently impossible to sort out how much is  CR/sinusitis with freq throat clearing (which can be related to primary GERD)   vs  causing  secondary (" extra esophageal")  GERD from wide swings in gastric pressure that occur with throat clearing, often  promoting self use of mint and menthol lozenges that reduce the lower esophageal sphincter tone and exacerbate the problem further in a cyclical fashion.   These are the same pts (now being labeled as having "irritable larynx syndrome" by some cough centers) who not infrequently have a history of having failed to tolerate ace inhibitors,  dry powder inhalers or biphosphonates or report having atypical/extraesophageal  reflux symptoms that don't respond to standard doses of PPI  and are easily confused as having aecopd or asthma flares by even experienced allergists/ pulmonologists (myself included).   rec max rx for gerd and pnds with 1st gen H1 blockers per guidelines / eliminate cyclical cough with tessalon 200 mg q6-8 h prn and also added 6 days of Prednisone in case of component of Th-2 driven upper or lower airways inflammation (if cough responds short term only to relapse befor return while will on rx for uacs that would point to allergic rhinitis/ asthma or eos bronchitis)   while awaiting for sinus/chest ct    Discussed with pt:  The standardized cough guidelines published in Chest by Lissa Morales in 2006 are still the best available and consist of a multiple step process (up to 12!) , not a single office visit,  and are intended  to address this problem logically,  with an alogrithm dependent on response to empiric treatment at  each progressive step  to determine a specific diagnosis with  minimal addtional testing needed. Therefore if adherence is an issue or can't be accurately verified,  it's very unlikely the standard evaluation and treatment will be successful here.    Furthermore, response to therapy (other than acute cough suppression, which should only be used short term with avoidance of narcotic containing cough syrups if possible), can be a gradual process for which the patient is not likely to  perceive immediate benefit.  Unlike going to an eye doctor where the best perscription is almost always the first one and is immediately effective, this is almost never the case in the management of chronic cough syndromes. Therefore the patient needs to commit up front to consistently adhere to recommendations  for up to 6 weeks of therapy directed at the likely underlying problem(s) before the response can be reasonably evaluated.    >>>> f/u in 4 weeks with all meds in hand using a trust but verify  approach to confirm accurate Medication  Reconciliation The principal here is that until we are certain that the  patients are doing what we've asked, it makes  no sense to ask them to do more.     Pleural plaque without asbestos Asbestos exp age [age] at her home where father installed in ceiling     - CT Chest 11/08/10 classic changes of pleural plaques      - PFT's wnl 01/15/11    - CT chest ordered 06/16/2019   In the absence of cough on inspiration I doubt she has asbestosis ILD now but chest will help sort this out and if any progression will return for full pfts    Discussed in detail all the  indications, usual  risks and alternatives  relative to the benefits with patient who agrees to proceed with w/u as outlined.        Total time devoted to counseling  > 50 % of initial 60 min office visit:  review case with pt/ discussion of options/alternatives/ personally creating written customized instructions  in presence of pt  then going over those specific  Instructions directly with the pt including how to use all of the meds but in particular covering each new medication in detail and the difference between the maintenance= "automatic" meds and the prns using an action plan format for the latter (If this problem/symptom => do that organization reading Left to right).  Please see AVS from this visit for a full list of these instructions which I personally wrote for this pt and  are unique to this visit.        Christinia Gully, MD 06/16/2019

## 2019-06-16 NOTE — Telephone Encounter (Signed)
She can try it but it's more similar to nexium which she could not tolerate - aciphex is generally better tolerated but more expensive, so there's a tradeoff.   Generic aciphex is only 15 dollars with the good rx app for a month supply

## 2019-06-16 NOTE — Telephone Encounter (Signed)
Pt stated that she can get pantoprazole for free with her insurance. Will need a prescription sent to [facility] on [address]

## 2019-06-16 NOTE — Assessment & Plan Note (Addendum)
Onset spring [id number]  - cyclical cough rx wth tesalon/ 1st gen H1 blockers per guidelines  - Allergy profile 06/16/2019 >  Eos 0.2 /  IgE    - Sinus CT ordered  The most common causes of chronic cough in immunocompetent adults include the following: upper airway cough syndrome (UACS), previously referred to as postnasal drip syndrome (PNDS), which is caused by variety of rhinosinus conditions; (2) asthma; (3) GERD; (4) chronic bronchitis from cigarette smoking or other inhaled environmental irritants; (5) nonasthmatic eosinophilic bronchitis; and (6) bronchiectasis.   These conditions, singly or in combination, have accounted for up to 94% of the causes of chronic cough in prospective studies.   Other conditions have constituted no >6% of the causes in prospective studies These have included bronchogenic carcinoma, chronic interstitial pneumonia, sarcoidosis, left ventricular failure, ACEI-induced cough, and aspiration from a condition associated with pharyngeal dysfunction.    Chronic cough is often simultaneously caused by more than one condition. A single cause has been found from 38 to 82% of the time, multiple causes from 18 to 62%. Multiply caused cough has been the result of three diseases up to 42% of the time.       Most likely this is Upper airway cough syndrome (previously labeled PNDS),  is so named because it's frequently impossible to sort out how much is  CR/sinusitis with freq throat clearing (which can be related to primary GERD)   vs  causing  secondary (" extra esophageal")  GERD from wide swings in gastric pressure that occur with throat clearing, often  promoting self use of mint and menthol lozenges that reduce the lower esophageal sphincter tone and exacerbate the problem further in a cyclical fashion.   These are the same pts (now being labeled as having "irritable larynx syndrome" by some cough centers) who not infrequently have a history of having failed to tolerate ace  inhibitors,  dry powder inhalers or biphosphonates or report having atypical/extraesophageal reflux symptoms that don't respond to standard doses of PPI  and are easily confused as having aecopd or asthma flares by even experienced allergists/ pulmonologists (myself included).   rec max rx for gerd and pnds with 1st gen H1 blockers per guidelines / eliminate cyclical cough with tessalon 200 mg q6-8 h prn and also added 6 days of Prednisone in case of component of Th-2 driven upper or lower airways inflammation (if cough responds short term only to relapse befor return while will on rx for uacs that would point to allergic rhinitis/ asthma or eos bronchitis)   while awaiting for sinus/chest ct    Discussed with pt:  The standardized cough guidelines published in Chest by Lissa Morales in 2006 are still the best available and consist of a multiple step process (up to 12!) , not a single office visit,  and are intended  to address this problem logically,  with an alogrithm dependent on response to empiric treatment at  each progressive step  to determine a specific diagnosis with  minimal addtional testing needed. Therefore if adherence is an issue or can't be accurately verified,  it's very unlikely the standard evaluation and treatment will be successful here.    Furthermore, response to therapy (other than acute cough suppression, which should only be used short term with avoidance of narcotic containing cough syrups if possible), can be a gradual process for which the patient is not likely to  perceive immediate benefit.  Unlike going to an eye doctor where the best  perscription is almost always the first one and is immediately effective, this is almost never the case in the management of chronic cough syndromes. Therefore the patient needs to commit up front to consistently adhere to recommendations  for up to 6 weeks of therapy directed at the likely underlying problem(s) before the response can be  reasonably evaluated.    >>>> f/u in 4 weeks with all meds in hand using a trust but verify approach to confirm accurate Medication  Reconciliation The principal here is that until we are certain that the  patients are doing what we've asked, it makes no sense to ask them to do more.

## 2019-06-16 NOTE — Patient Instructions (Addendum)
For "cough" try tessalon pearls 200 mg every 6-8 hours as needed with goal of no coughing or clearing the throat    For drainage / throat tickle stop zyrtec D  try take CHLORPHENIRAMINE  4 mg  (Chlortab 4mg   at [employer] should be easiest to find in the green box)  take one every 4 hours as needed - available over the counter- may cause drowsiness so start with just a bedtime dose or two and see how you tolerate it before trying in daytime    Pantoprazole (protonix) 40 mg   Take  30-60 min before first meal of the day and Pepcid (famotidine)  20 mg one @  bedtime until return to office - this is the best way to tell whether stomach acid is contributing to your problem.   GERD (REFLUX)  is an extremely common cause of respiratory symptoms just like yours , many times with no obvious heartburn at all.    It can be treated with medication, but also with lifestyle changes including elevation of the head of your bed (ideally with 6 -8inch blocks under the headboard of your bed),  Smoking cessation, avoidance of late meals, excessive alcohol, and avoid fatty foods, chocolate, peppermint, colas, red wine, and acidic juices such as orange juice.  NO MINT OR MENTHOL PRODUCTS SO NO COUGH DROPS  USE SUGARLESS CANDY INSTEAD (Jolley ranchers or Stover's or Life Savers) or even ice chips will also do - the key is to swallow to prevent all throat clearing. NO OIL BASED VITAMINS - use powdered substitutes.  Avoid fish oil when coughing.    Please remember to go to the lab department   for your tests - we will call you with the results when they are available.      We will call to schedule your CT chest and sinus    Please schedule a follow up office visit in 4 weeks, sooner if needed  with all medications /inhalers/ solutions in hand so we can verify exactly what you are taking. This includes all medications from all doctors and over the counters

## 2019-06-16 NOTE — Telephone Encounter (Signed)
Spoke with the pt  She states went to pick up rx for aciphex and it was $100 (the AVS stated pantoprazole, but you sent aciphex) Please advise on alternative, thanks

## 2019-06-16 NOTE — Assessment & Plan Note (Signed)
Asbestos exp age [age] at her home where father installed in ceiling     - CT Chest 11/08/10 classic changes of pleural plaques      - PFT's wnl 01/15/11    - CT chest ordered 06/16/2019   In the absence of cough on inspiration I doubt she has asbestosis ILD now but chest will help sort this out and if any progression will return for full pfts    Discussed in detail all the  indications, usual  risks and alternatives  relative to the benefits with patient who agrees to proceed with w/u as outlined.    Total time devoted to counseling  > 50 % of initial 60 min office visit:  review case with pt/ discussion of options/alternatives/ personally creating written customized instructions  in presence of pt  then going over those specific  Instructions directly with the pt including how to use all of the meds but in particular covering each new medication in detail and the difference between the maintenance= "automatic" meds and the prns using an action plan format for the latter (If this problem/symptom => do that organization reading Left to right).  Please see AVS from this visit for a full list of these instructions which I personally wrote for this pt and  are unique to this visit.

## 2019-06-17 ENCOUNTER — Telehealth: Payer: Self-pay | Admitting: Internal Medicine

## 2019-06-17 ENCOUNTER — Other Ambulatory Visit: Payer: Self-pay | Admitting: Internal Medicine

## 2019-06-17 LAB — RESPIRATORY ALLERGY PROFILE REGION II ~~LOC~~

## 2019-06-17 LAB — INTERPRETATION:

## 2019-06-17 MED ORDER — PANTOPRAZOLE SODIUM 40 MG PO TBEC: 40.0000 mg | DELAYED_RELEASE_TABLET | Freq: Every day | ORAL | 1 refills | Status: DC

## 2019-06-17 NOTE — Telephone Encounter (Signed)
Ok to tri pantoprazole as originally Environmental health practitioner

## 2019-06-17 NOTE — Telephone Encounter (Signed)
Spoke with patient.  Sent order in for 40mg  daily as Dr. Gustavus Bryant last patient instruction.  Pharmacy verified.  Nothing further needed

## 2019-06-17 NOTE — Telephone Encounter (Signed)
Spoke with patient. She was under the impression that she was getting 2 CT scans tomorrow. She called [employer] to get the price for the CT scan but she believes she was only given a quote for 1 scan. Advised patient that MW ordered 2 scans for her, one for her sinuses and one for her chest.   Advised her that before her CT scan, someone will go over the financial responsibility with her. She verbalized understanding.   Nothing further needed at time of call.

## 2019-06-17 NOTE — Telephone Encounter (Signed)
Called and spoke to pt. Informed her of the recs per MW. Pt verbalized understanding and states she will call the pharmacy to apply the good rx coupon. Pt will call us back regardless just to confirm what medication she decided on.

## 2019-06-17 NOTE — Telephone Encounter (Signed)
Pt called back and states she would prefer the Pantoprazole.   Dr. Melvyn Novas please advise on the strength and directions. Thanks.

## 2019-06-18 ENCOUNTER — Other Ambulatory Visit: Payer: Self-pay

## 2019-06-18 ENCOUNTER — Ambulatory Visit (INDEPENDENT_AMBULATORY_CARE_PROVIDER_SITE_OTHER)
Admission: RE | Admit: 2019-06-18 | Discharge: 2019-06-18 | Disposition: A | Discharge status: Home / Self Care | Payer: Medicare HMO | Source: Ambulatory Visit | Attending: Internal Medicine | Admitting: Internal Medicine

## 2019-06-18 DIAGNOSIS — R05 Cough: Secondary | ICD-10-CM | POA: Diagnosis not present

## 2019-06-18 DIAGNOSIS — R059 Cough, unspecified: Secondary | ICD-10-CM

## 2019-06-18 DIAGNOSIS — R058 Other specified cough: Secondary | ICD-10-CM

## 2019-06-18 DIAGNOSIS — J929 Pleural plaque without asbestos: Secondary | ICD-10-CM | POA: Diagnosis not present

## 2019-06-18 DIAGNOSIS — R0989 Other specified symptoms and signs involving the circulatory and respiratory systems: Secondary | ICD-10-CM | POA: Diagnosis not present

## 2019-06-18 NOTE — Progress Notes (Signed)
Left detailed msg on machine ok per DPR

## 2019-07-14 ENCOUNTER — Ambulatory Visit: Payer: Medicare HMO | Admitting: Internal Medicine

## 2019-07-21 ENCOUNTER — Ambulatory Visit: Payer: Medicare HMO | Admitting: Internal Medicine

## 2019-07-21 ENCOUNTER — Encounter: Payer: Self-pay | Admitting: Internal Medicine

## 2019-07-21 ENCOUNTER — Other Ambulatory Visit: Payer: Self-pay

## 2019-07-21 DIAGNOSIS — R05 Cough: Secondary | ICD-10-CM | POA: Diagnosis not present

## 2019-07-21 DIAGNOSIS — J929 Pleural plaque without asbestos: Secondary | ICD-10-CM

## 2019-07-21 DIAGNOSIS — R058 Other specified cough: Secondary | ICD-10-CM

## 2019-07-21 MED ORDER — SUCRALFATE 1 G PO TABS: ORAL_TABLET | ORAL | 2 refills | Status: DC

## 2019-07-21 NOTE — Assessment & Plan Note (Addendum)
On[facility] spring XX123456  - cyclical cough rx wth tesalon/ 1st gen H1 blockers per guidelines > improved  07/21/2019  - Allergy profile 06/16/2019 >  Eos 0.2 /  IgE 4 RAST neg    - Sinus CT 06/18/2019 >>>  wnl   Sinus ct and allergy profile reviewed > not clear whether this is vasomotor or not but nothing to suggest significant allergy or sinus dz so rec aggressively rx GERD 1st as per prev rx = change to carafate  And re-establish with GI at Dr Quay Burow discretion.  If gerd all better but still cough > f/u [facility]/ Dr [facility].   Pulmonary f/u can be prn    I had an extended discussion with the patient reviewing all relevant studies completed to date and  lasting 15 to 20 minutes of a 25 minute summary final f/u office visit    Each maintenance medication was reviewed in detail including most importantly the difference between maintenance and prns and under what circumstances the prns are to be triggered using an action plan format that is not reflected in the computer generated alphabetically organized AVS.     Please see AVS for specific instructions unique to this visit that I personally wrote and verbalized to the the pt in detail and then reviewed with pt  by my nurse highlighting any  changes in therapy recommended at today's visit to their plan of care.

## 2019-07-21 NOTE — Progress Notes (Signed)
Emily [name], female    DOB: Oct 14, 1940,   MRN: [id number]   Brief patient profile:  78 yowf never smoker placed  on inhaler around 2005 for sob  but only took it a few weeks and the problem did not recur then onset of watery rhinitis and sneezing but  perennial x around 2014 p [place] to present house in [facility] 2012 and started zyrtec d and helped some but still daily symptoms but much worse spring 2020  And assoc with sob and need to clear the throat  And sob better on saba but not the urge to clear throat so referred to pulmonary clinic 06/16/2019 by Dr  Quay Burow   Has asbestos exp since age [age] in her home    History of Present Illness  06/16/2019  Pulmonary/ 1st office eval/Zineb Glade  Chief Complaint  Patient presents with  . Pulmonary Consult    Referred by Dr. Billey Gosling. Pt c/o SOB since April 2020. She states SOB comes and goes and is associated with her PND.   Dyspnea:  Assoc with cough and urge to clear throat some better with saba  Walking up to a mile outside some hills  Cough: urge to throat / can't get mucus up, feels globus sensation - did bring up slt yellow mucus resolved on amox but not the globus sensation Sleep: flat on side one pillow  SABA use: twice weekly helps some  Now having overt hb/ belching started well p the chronic cough  rec For "cough" try tessalon pearls 200 mg every 6-8 hours as needed with goal of no coughing or clearing the throat   For drainage / throat tickle stop zyrtec D  try take CHLORPHENIRAMINE  4 mg  (Chlortab 4mg    Pantoprazole (protonix) 40 mg   Take  30-60 min before first meal of the day and Pepcid (famotidine)  20 mg one @  bedtime until return to office  GERD diet  Please schedule a follow up office visit in 4 weeks, sooner if needed  with all medications /inhalers/ solutions in hand so we can verify exactly what you are taking. This includes all medications from all doctors and over the  counters   07/21/2019  f/u ov/Adya Wirz re: rhinitis/ pnds since spring 2014 and much worse since spring 2020  Chief Complaint  Patient presents with  . Follow-up    Breathing has improved and no new co's.   Dyspnea:  Not limited by breathing from desired activities  / walks around neighborhood ok  Cough: no cough now  but still senses excess  Drainage esp first thing in am / not using chlorpheniramine correctly, atrovent helps some- thinks chlorpheniramine works better than zyrtec Sleeping: fine flat/ one pillow  SABA use: none  02: none  Still belching, overt "hb" on ppi, says Delfin Edis gave her carafate 2017 and helped a lot better than ppi    No obvious day to day or daytime variability or assoc excess/ purulent sputum or mucus plugs or hemoptysis or cp or chest tightness, subjective wheeze.  Sleeping  without nocturnal  or early am exacerbation  of respiratory  c/o's or need for noct saba. Also denies any obvious fluctuation of symptoms with weather or environmental changes or other aggravating or alleviating factors except as outlined above.  No unusual exposure hx or h/o childhood pna/ asthma or knowledge of premature birth.  Current Allergies, Complete Past Medical History, Past Surgical History,  Family History, and Social History were reviewed in [employer] record.  ROS  The following are not active complaints unless bolded Hoarseness, sore throat, dysphagia, dental problems, itching, sneezing,  nasal congestion or discharge of excess mucus or purulent secretions, ear ache,   fever, chills, sweats, unintended wt loss or wt gain, classically pleuritic or exertional cp,  orthopnea pnd or arm/hand swelling  or leg swelling, presyncope, palpitations, abdominal pain, anorexia, nausea, vomiting, diarrhea  or change in bowel habits or change in bladder habits, change in stools or change in urine, dysuria, hematuria,  rash, arthralgias, visual complaints, headache,  numbness, weakness or ataxia or problems with walking or coordination,  change in mood or  memory.        Current Meds  Medication Sig  . atenolol (TENORMIN) 25 MG tablet Take 1 tablet by mouth daily.  . benzonatate (TESSALON) 200 MG capsule Take 1 capsule (200 mg total) by mouth 3 (three) times daily as needed for cough.  . Calcium-Vitamin D-Vitamin K (CALCIUM + D + K) 750-500-40 MG-UNT-MCG TABS Take 1 tablet by mouth 2 (two) times daily.    . chlorpheniramine (CHLOR-TRIMETON) 4 MG tablet Take 4 mg by mouth every 4 (four) hours as needed for allergies.  . famotidine (PEPCID) 20 MG tablet One after supper  . ipratropium (ATROVENT) 0.03 % nasal spray INHALE 2 SPRAYS IN EACH NOSTRIL EVERY 12 HOURS  . levothyroxine (SYNTHROID) 75 MCG tablet TAKE 1 TABLET BY MOUTH EVERY DAY  . [DISCONTINUED] albuterol (VENTOLIN HFA) 108 (90 Base) MCG/ACT inhaler Inhale 2 puffs into the lungs every 6 (six) hours as needed for wheezing or shortness of breath.  . [DISCONTINUED] cetirizine-pseudoephedrine (ZYRTEC-D) 5-120 MG tablet Take 1 tablet by mouth daily as needed for allergies.  .   pantoprazole (PROTONIX) 40 MG tablet Take 1 tablet (40 mg total) by mouth daily.                     Past Medical History:  Diagnosis Date  . Actinic keratosis   . Allergic rhinitis   . BACK PAIN   . 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 Cervicalgia   . Cholelithiasis   . DIVERTICULOSIS, COLON   . ENDOMETRIOSIS   . GERD (gastroesophageal reflux disease)   . Heart palpitations   . Hypertension   . HYPOTHYROIDISM   . Left wrist fracture 11/06/2016   10/2016 - fell of high stool  . Personal history of chemotherapy   . Personal history of radiation therapy   . Pleural plaque without asbestos    CT chest 10/2010: R>L lobes, upper> lower  . Squamous cell carcinoma    History of BC       Objective:    Pleasant amb wf nad  Wt Readings from Last 3 Encounters:   07/21/19 120 lb 9.6 oz (54.7 kg)  06/16/19 122 lb (55.3 kg)  05/18/19 121 lb (54.9 kg)     Vital signs reviewed - Note on arrival 02 sats  97% on RA   HEENT : pt wearing mask not removed for exam due to covid -19 concerns.    NECK :  without JVD/Nodes/TM/ nl carotid upstrokes bilaterally   LUNGS: no acc muscle use,  Nl contour chest which is clear to A and P bilaterally without cough on insp or exp maneuvers   CV:  RRR  no s3 or murmur or increase in P2, and no  edema   ABD:  soft and nontender with nl inspiratory excursion in the supine position. No bruits or organomegaly appreciated, bowel sounds nl  MS:  Nl gait/ ext warm without deformities, calf tenderness, cyanosis or clubbing No obvious joint restrictions   SKIN: warm and dry without lesions    NEURO:  alert, approp, nl sensorium with  no motor or cerebellar deficits apparent.               Assessment

## 2019-07-21 NOTE — Assessment & Plan Note (Signed)
Never smoker/ asbestos exp age [age] at her home where father installed in ceiling     - CT Chest 11/08/10 classic changes of pleural plaques      - PFT's wnl 01/15/11    - CT chest repeat  06/18/2019  No change   Not related to cough, no ILD and relatively low risk for ca at this point with no standard of care for CT f/u of just plaques this many years out from exposure so f/u can be prn new symptoms  Discussed in detail all the  indications, usual  risks and alternatives  relative to the benefits with patient who agrees to proceed with conservative f/u as outlined

## 2019-07-21 NOTE — Patient Instructions (Addendum)
carafate 1 gm up to four times daily as needed   Stop pantoprazole   Continue pepcid at bedtime   If not satisfied you will need to return to GI    If you are satisfied with your treatment plan,  let your doctor know and he/she can either refill your medications or you can return here when your prescription runs out.     If in any way you are not 100% satisfied,  please tell us.  If 100% better, tell your friends!  Pulmonary follow up is as needed

## 2019-08-06 ENCOUNTER — Other Ambulatory Visit: Payer: Self-pay | Admitting: Internal Medicine

## 2019-08-16 ENCOUNTER — Encounter: Payer: Self-pay | Admitting: Internal Medicine

## 2019-08-16 DIAGNOSIS — K219 Gastro-esophageal reflux disease without esophagitis: Secondary | ICD-10-CM

## 2019-08-26 ENCOUNTER — Telehealth: Payer: Self-pay

## 2019-08-26 DIAGNOSIS — M85869 Other specified disorders of bone density and structure, unspecified lower leg: Secondary | ICD-10-CM

## 2019-08-26 DIAGNOSIS — E038 Other specified hypothyroidism: Secondary | ICD-10-CM

## 2019-08-26 DIAGNOSIS — Z0000 Encounter for general adult medical examination without abnormal findings: Secondary | ICD-10-CM

## 2019-08-26 DIAGNOSIS — E782 Mixed hyperlipidemia: Secondary | ICD-10-CM

## 2019-08-26 NOTE — Telephone Encounter (Signed)
Copied from [place] [id number]. Topic: General - Other >> Aug 26, 2019 11:14 AM Rayann Heman wrote: Reason for CRM: pt called and stated that she has a physical on 09/22/18 and would like to schedule blood work before appointment so they can discuss blood counts. Please advise

## 2019-08-26 NOTE — Addendum Note (Signed)
Addended by: Binnie Rail on: 08/26/2019 01:20 PM   Modules accepted: Orders

## 2019-08-26 NOTE — Telephone Encounter (Signed)
Lab appt scheduled 09/21/19.

## 2019-08-26 NOTE — Telephone Encounter (Signed)
Blood work ordered-please schedule

## 2019-08-28 ENCOUNTER — Other Ambulatory Visit: Payer: Self-pay | Admitting: Internal Medicine

## 2019-08-31 ENCOUNTER — Encounter: Payer: Self-pay | Admitting: Internal Medicine

## 2019-09-01 ENCOUNTER — Other Ambulatory Visit: Payer: Self-pay | Admitting: Orthopedic Surgery

## 2019-09-01 ENCOUNTER — Other Ambulatory Visit: Payer: Self-pay | Admitting: Obstetrics and Gynecology

## 2019-09-01 DIAGNOSIS — M8580 Other specified disorders of bone density and structure, unspecified site: Secondary | ICD-10-CM

## 2019-09-02 ENCOUNTER — Encounter: Payer: Self-pay | Admitting: Internal Medicine

## 2019-09-03 ENCOUNTER — Ambulatory Visit: Payer: Medicare HMO | Admitting: Internal Medicine

## 2019-09-08 ENCOUNTER — Ambulatory Visit: Payer: Medicare Other | Attending: Internal Medicine

## 2019-09-08 DIAGNOSIS — Z23 Encounter for immunization: Secondary | ICD-10-CM

## 2019-09-08 NOTE — Progress Notes (Signed)
   Covid-19 Vaccination Clinic  Name:  Emily [name]    MRN: [id number] DOB: April 03, 1941  09/08/2019  [name] was observed post Covid-19 immunization for 15 minutes without incidence. She was provided with Vaccine Information Sheet and instruction to access the V-Safe system.   [name] was instructed to call 911 with any severe reactions post vaccine: Marland Kitchen Difficulty breathing  . Swelling of your face and throat  . A fast heartbeat  . A bad rash all over your body  . Dizziness and weakness    Immunizations Administered    Name Date Dose VIS Date Route   Pfizer COVID-19 Vaccine 09/08/2019  9:58 AM 0.3 mL 07/30/2019 Intramuscular   Manufacturer: [place]   Lot: [id number]   [place]: [id number]

## 2019-09-21 ENCOUNTER — Other Ambulatory Visit (INDEPENDENT_AMBULATORY_CARE_PROVIDER_SITE_OTHER): Payer: Medicare HMO

## 2019-09-21 ENCOUNTER — Other Ambulatory Visit: Payer: Self-pay

## 2019-09-21 DIAGNOSIS — Z0000 Encounter for general adult medical examination without abnormal findings: Secondary | ICD-10-CM | POA: Diagnosis not present

## 2019-09-21 DIAGNOSIS — M85869 Other specified disorders of bone density and structure, unspecified lower leg: Secondary | ICD-10-CM | POA: Diagnosis not present

## 2019-09-21 DIAGNOSIS — E038 Other specified hypothyroidism: Secondary | ICD-10-CM | POA: Diagnosis not present

## 2019-09-21 DIAGNOSIS — E782 Mixed hyperlipidemia: Secondary | ICD-10-CM

## 2019-09-21 LAB — CBC WITH DIFFERENTIAL/PLATELET
Basophils Absolute: 0 10*3/uL (ref 0.0–0.1)
Basophils Relative: 0.5 % (ref 0.0–3.0)
Eosinophils Absolute: 0.2 10*3/uL (ref 0.0–0.7)
Eosinophils Relative: 1.9 % (ref 0.0–5.0)
HCT: 42.9 % (ref 36.0–46.0)
Hemoglobin: 14.2 g/dL (ref 12.0–15.0)
Lymphocytes Relative: 25.3 % (ref 12.0–46.0)
Lymphs Abs: 2.4 10*3/uL (ref 0.7–4.0)
MCHC: 33.1 g/dL (ref 30.0–36.0)
MCV: 92.4 fl (ref 78.0–100.0)
Monocytes Absolute: 0.8 10*3/uL (ref 0.1–1.0)
Monocytes Relative: 8.8 % (ref 3.0–12.0)
Neutro Abs: 6 10*3/uL (ref 1.4–7.7)
Neutrophils Relative %: 63.5 % (ref 43.0–77.0)
Platelets: 213 10*3/uL (ref 150.0–400.0)
RBC: 4.64 Mil/uL (ref 3.87–5.11)
RDW: 13 % (ref 11.5–15.5)
WBC: 9.4 10*3/uL (ref 4.0–10.5)

## 2019-09-21 LAB — COMPREHENSIVE METABOLIC PANEL
ALT: 22 U/L (ref 0–35)
AST: 19 U/L (ref 0–37)
Albumin: 4.3 g/dL (ref 3.5–5.2)
Alkaline Phosphatase: 77 U/L (ref 39–117)
BUN: 27 mg/dL — ABNORMAL HIGH (ref 6–23)
CO2: 28 mEq/L (ref 19–32)
Calcium: 10.1 mg/dL (ref 8.4–10.5)
Chloride: 102 mEq/L (ref 96–112)
Creatinine, Ser: 1.36 mg/dL — ABNORMAL HIGH (ref 0.40–1.20)
GFR: 37.68 mL/min — ABNORMAL LOW (ref >60.00)
Glucose, Bld: 107 mg/dL — ABNORMAL HIGH (ref 70–99)
Potassium: 4 mEq/L (ref 3.5–5.1)
Sodium: 137 mEq/L (ref 135–145)
Total Bilirubin: 0.4 mg/dL (ref 0.2–1.2)
Total Protein: 7.4 g/dL (ref 6.0–8.3)

## 2019-09-21 LAB — LIPID PANEL
Cholesterol: 211 mg/dL — ABNORMAL HIGH (ref 0–200)
HDL: 52.5 mg/dL (ref >39.00)
LDL Cholesterol: 133 mg/dL — ABNORMAL HIGH (ref 0–99)
NonHDL: 158.08
Total CHOL/HDL Ratio: 4
Triglycerides: 124 mg/dL (ref 0.0–149.0)
VLDL: 24.8 mg/dL (ref 0.0–40.0)

## 2019-09-21 LAB — VITAMIN D 25 HYDROXY (VIT D DEFICIENCY, FRACTURES): VITD: 53.9 ng/mL (ref 30.00–100.00)

## 2019-09-21 LAB — TSH: TSH: 4.32 u[IU]/mL (ref 0.35–4.50)

## 2019-09-22 NOTE — Progress Notes (Signed)
Subjective:    Patient ID: Emily [name], female    DOB: December 07, 1940, 79 y.o.   MRN: [id number]  HPI She is here for a physical exam.   Her urine flow has changed - she will stop urinating and still have the urge to urinate more and she will get some urine dribble out.  This started about 6 months ago.  She denies dysuria, change in color or odor.  She is taking zyrtec D daily. She does not drink enough water during the day.  She denies dysuria.         Medications and allergies reviewed with patient and updated if appropriate.  Patient Active Problem List   Diagnosis Date Noted  . SOB (shortness of breath) 01/15/2019  . Upper airway cough syndrome 01/15/2019  . Belching 12/14/2018  . Reactive airway disease 12/05/2018  . Pre-syncope 05/19/2018  . DDD (degenerative disc disease), cervical 01/15/2018  . Neuralgia 09/12/2017  . Memory difficulties 11/06/2016  . IBS (irritable bowel syndrome) 04/16/2016  . Hyperlipidemia 09/10/2015  . Osteopenia 09/08/2015  . Palpitations   . Pleural plaque without asbestos 12/03/2010  . Cervicalgia 12/18/2009  . DIVERTICULOSIS, COLON 03/02/2009  . DYSPLASTIC NEVUS, [place] 12/06/2008  . Lumbago 03/11/2008  . Actinic keratosis 01/23/2008  . CARCINOMA, BREAST, ESTROGEN RECEPTOR NEGATIVE 10/12/2007  . Hypothyroidism 06/18/2007  . Allergic rhinitis 06/11/2007    Current Outpatient Medications on File Prior to Visit  Medication Sig Dispense Refill  . atenolol (TENORMIN) 25 MG tablet Take 1 tablet by mouth daily. 90 tablet 1  . benzonatate (TESSALON) 200 MG capsule TAKE 1 CAPSULE BY MOUTH EVERY DAY 3 TIMES A DAY AS NEEDED FOR COUGH 40 capsule 0  . Calcium-Vitamin D-Vitamin K (CALCIUM + D + K) [id number] MG-UNT-MCG TABS Take 1 tablet by mouth 2 (two) times daily.      . cholecalciferol (VITAMIN D) 25 MCG (1000 UNIT) tablet Take 1,000 Units by mouth daily.    . famotidine (PEPCID) 20 MG tablet One after supper 30 tablet 11  . levothyroxine  (SYNTHROID) 75 MCG tablet TAKE 1 TABLET BY MOUTH EVERY DAY 90 tablet 1  . VALERIAN ROOT PLUS PO Take by mouth.    Marland Kitchen ZYRTEC-D ALLERGY & CONGESTION 5-120 MG tablet Take 1 tablet by mouth 2 (two) times daily.    . [DISCONTINUED] RABEprazole (ACIPHEX) 20 MG tablet Take 1 tablet (20 mg total) by mouth daily. 30 tablet 2   No current facility-administered medications on file prior to visit.    Past Medical History:  Diagnosis Date  . Actinic keratosis   . Allergic rhinitis   . BACK PAIN   . 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 Cervicalgia   . Cholelithiasis   . DIVERTICULOSIS, COLON   . ENDOMETRIOSIS   . GERD (gastroesophageal reflux disease)   . Heart palpitations   . Hypertension   . HYPOTHYROIDISM   . Left wrist fracture 11/06/2016   10/2016 - fell of high stool  . Personal history of chemotherapy   . Personal history of radiation therapy   . Pleural plaque without asbestos    CT chest 10/2010: R>L lobes, upper> lower  . Squamous cell carcinoma    History of BC    Past Surgical History:  Procedure Laterality Date  . BREAST LUMPECTOMY  2001   left  . CATARACT EXTRACTION Bilateral 09/2014   both eyes  . CHOLECYSTECTOMY  2010  . ENDOMETRIAL  ABLATION    . EXCISION / BIOPSY BREAST / NIPPLE / DUCT Left 2001  . TUBAL LIGATION      Social History   Socioeconomic History  . Marital status: Married    Spouse name: Not on file  . Number of children: 2  . Years of education: Not on file  . Highest education level: Not on file  Occupational History  . Occupation: Retired    Comment: Chiropractor  Tobacco Use  . Smoking status: Never Smoker  . Smokeless tobacco: Never Used  Substance and Sexual Activity  . Alcohol use: No  . Drug use: No  . Sexual activity: Never  Other Topics Concern  . Not on file  Social History Narrative   Married, lives in [place] area since 2008 from [place] to be close to dtr   Social  Determinants of Health   Financial Resource Strain:   . Difficulty of Paying Living Expenses: Not on file  Food Insecurity:   . Worried About Charity fundraiser in the Last Year: Not on file  . Ran Out of Food in the Last Year: Not on file  Transportation Needs:   . Lack of Transportation (Medical): Not on file  . Lack of Transportation (Non-Medical): Not on file  Physical Activity:   . Days of Exercise per Week: Not on file  . Minutes of Exercise per Session: Not on file  Stress:   . Feeling of Stress : Not on file  Social Connections:   . Frequency of Communication with Friends and Family: Not on file  . Frequency of Social Gatherings with Friends and Family: Not on file  . Attends Religious Services: Not on file  . Active Member of Clubs or Organizations: Not on file  . Attends Archivist Meetings: Not on file  . Marital Status: Not on file    Family History  Problem Relation Age of Onset  . Heart disease Father   . Emphysema Father        smoked and worked with asbestos  . Colon cancer Neg Hx     Review of Systems  Constitutional: Negative for chills and fever.  Eyes: Negative for visual disturbance.  Respiratory: Positive for cough (throat clearing). Negative for shortness of breath and wheezing.   Cardiovascular: Negative for chest pain, palpitations and leg swelling.  Gastrointestinal: Negative for abdominal pain, blood in stool, constipation, diarrhea and nausea.       GERD controlled  Genitourinary: Positive for decreased urine volume and difficulty urinating. Negative for dysuria, frequency and hematuria.       Nocturia 2-3 times  Musculoskeletal: Negative for arthralgias and back pain.  Skin: Negative for color change and rash.  Neurological: Negative for light-headedness and headaches.  Psychiatric/Behavioral: Negative for dysphoric mood. The patient is not nervous/anxious.        Objective:   Vitals:   09/23/19 0821  BP: 136/82  Pulse: 83    Resp: 16  Temp: 97.7 F (36.5 C)  SpO2: 99%   Filed Weights   09/23/19 0821  Weight: 121 lb (54.9 kg)   Body mass index is 23.24 kg/m.  BP Readings from Last 3 Encounters:  09/23/19 136/82  07/21/19 120/76  06/16/19 128/84    Wt Readings from Last 3 Encounters:  09/23/19 121 lb (54.9 kg)  07/21/19 120 lb 9.6 oz (54.7 kg)  06/16/19 122 lb (55.3 kg)   Depression screen [facility] 2/9 09/23/2019 09/16/2018 09/12/2017 04/20/2015 07/14/2013  Decreased Interest  0 0 0 0 0  Down, Depressed, Hopeless 0 0 0 0 0  PHQ - 2 Score 0 0 0 0 0      Physical Exam Constitutional: She appears well-developed and well-nourished. No distress.  HENT:  Head: Normocephalic and atraumatic.  Right Ear: External ear normal. Normal ear canal and TM Left Ear: External ear normal.  Normal ear canal and TM Mouth/Throat: Oropharynx is clear and moist.  Eyes: Conjunctivae and EOM are normal.  Neck: Neck supple. No tracheal deviation present. No thyromegaly present.  No carotid bruit  Cardiovascular: Normal rate, regular rhythm and normal heart sounds.   No murmur heard.  No edema. Pulmonary/Chest: Effort normal and breath sounds normal. No respiratory distress. She has no wheezes. She has no rales.  Breast: deferred   Abdominal: Soft. She exhibits no distension. There is no tenderness.  Lymphadenopathy: She has no cervical adenopathy.  Skin: Skin is warm and dry. She is not diaphoretic.  Psychiatric: She has a normal mood and affect. Her behavior is normal.        Assessment & Plan:   Health Maintenance  Topic Date Due  . DEXA SCAN  07/18/2019  . TETANUS/TDAP  07/07/2027  . INFLUENZA VACCINE  Completed  . PNA vac Low Risk Adult  Completed     Physical exam: Screening blood work    ordered Immunizations   Had covid #1,  Discussed shingrix Colonoscopy  Last done 2010 Mammogram   Up to date  89   - Dr Garwin Brothers - up to date Dexa   scheduled Eye exam  Not up to date Exercise:  none Weight:     Normal BMI Substance abuse:   none   See Problem List for Assessment and Plan of chronic medical problems.     This visit occurred during the SARS-CoV-2 public health emergency.  Safety protocols were in place, including screening questions prior to the visit, additional usage of staff PPE, and extensive cleaning of exam room while observing appropriate contact time as indicated for disinfecting solutions.

## 2019-09-22 NOTE — Patient Instructions (Addendum)
Blood work was ordered - schedule for one week from now.    All other Health Maintenance issues reviewed.   All recommended immunizations and age-appropriate screenings are up-to-date or discussed.  No immunization administered today.   Medications reviewed and updated.  Changes include :   Stop zyrtec - D.  Increase ipratropium to 3-4 times a day.  Try the natural supplement for your cholesterol.    Your prescription(s) have been submitted to your pharmacy. Please take as directed and contact our office if you believe you are having problem(s) with the medication(s).    Please followup in 6 months    Health Maintenance, Female Adopting a healthy lifestyle and getting preventive care are important in promoting health and wellness. Ask your health care provider about:  The right schedule for you to have regular tests and exams.  Things you can do on your own to prevent diseases and keep yourself healthy. What should I know about diet, weight, and exercise? Eat a healthy diet   Eat a diet that includes plenty of vegetables, fruits, low-fat dairy products, and lean protein.  Do not eat a lot of foods that are high in solid fats, added sugars, or sodium. Maintain a healthy weight Body mass index (BMI) is used to identify weight problems. It estimates body fat based on height and weight. Your health care provider can help determine your BMI and help you achieve or maintain a healthy weight. Get regular exercise Get regular exercise. This is one of the most important things you can do for your health. Most adults should:  Exercise for at least 150 minutes each week. The exercise should increase your heart rate and make you sweat (moderate-intensity exercise).  Do strengthening exercises at least twice a week. This is in addition to the moderate-intensity exercise.  Spend less time sitting. Even light physical activity can be beneficial. Watch cholesterol and blood lipids Have your  blood tested for lipids and cholesterol at 78 years of age, then have this test every 5 years. Have your cholesterol levels checked more often if:  Your lipid or cholesterol levels are high.  You are older than 78 years of age.  You are at high risk for heart disease. What should I know about cancer screening? Depending on your health history and family history, you may need to have cancer screening at various ages. This may include screening for:  Breast cancer.  Cervical cancer.  Colorectal cancer.  Skin cancer.  Lung cancer. What should I know about heart disease, diabetes, and high blood pressure? Blood pressure and heart disease  High blood pressure causes heart disease and increases the risk of stroke. This is more likely to develop in people who have high blood pressure readings, are of African descent, or are overweight.  Have your blood pressure checked: ? Every 3-5 years if you are 45-66 years of age. ? Every year if you are 81 years old or older. Diabetes Have regular diabetes screenings. This checks your fasting blood sugar level. Have the screening done:  Once every three years after age 43 if you are at a normal weight and have a low risk for diabetes.  More often and at a younger age if you are overweight or have a high risk for diabetes. What should I know about preventing infection? Hepatitis B If you have a higher risk for hepatitis B, you should be screened for this virus. Talk with your health care provider to find out if you  are at risk for hepatitis B infection. Hepatitis C Testing is recommended for:  Everyone born from [birth year] through 1964.  Anyone with known risk factors for hepatitis C. Sexually transmitted infections (STIs)  Get screened for STIs, including gonorrhea and chlamydia, if: ? You are sexually active and are younger than 78 years of age. ? You are older than 78 years of age and your health care provider tells you that you are at risk  for this type of infection. ? Your sexual activity has changed since you were last screened, and you are at increased risk for chlamydia or gonorrhea. Ask your health care provider if you are at risk.  Ask your health care provider about whether you are at high risk for HIV. Your health care provider may recommend a prescription medicine to help prevent HIV infection. If you choose to take medicine to prevent HIV, you should first get tested for HIV. You should then be tested every 3 months for as long as you are taking the medicine. Pregnancy  If you are about to stop having your period (premenopausal) and you may become pregnant, seek counseling before you get pregnant.  Take 400 to 800 micrograms (mcg) of folic acid every day if you become pregnant.  Ask for birth control (contraception) if you want to prevent pregnancy. Osteoporosis and menopause Osteoporosis is a disease in which the bones lose minerals and strength with aging. This can result in bone fractures. If you are 70 years old or older, or if you are at risk for osteoporosis and fractures, ask your health care provider if you should:  Be screened for bone loss.  Take a calcium or vitamin D supplement to lower your risk of fractures.  Be given hormone replacement therapy (HRT) to treat symptoms of menopause. Follow these instructions at home: Lifestyle  Do not use any products that contain nicotine or tobacco, such as cigarettes, e-cigarettes, and chewing tobacco. If you need help quitting, ask your health care provider.  Do not use street drugs.  Do not share needles.  Ask your health care provider for help if you need support or information about quitting drugs. Alcohol use  Do not drink alcohol if: ? Your health care provider tells you not to drink. ? You are pregnant, may be pregnant, or are planning to become pregnant.  If you drink alcohol: ? Limit how much you use to 0-1 drink a day. ? Limit intake if you are  breastfeeding.  Be aware of how much alcohol is in your drink. In the [place] one drink equals one 12 oz bottle of beer (355 mL), one 5 oz glass of wine (148 mL), or one 1 oz glass of hard liquor (44 mL). General instructions  Schedule regular health, dental, and eye exams.  Stay current with your vaccines.  Tell your health care provider if: ? You often feel depressed. ? You have ever been abused or do not feel safe at home. Summary  Adopting a healthy lifestyle and getting preventive care are important in promoting health and wellness.  Follow your health care provider's instructions about healthy diet, exercising, and getting tested or screened for diseases.  Follow your health care provider's instructions on monitoring your cholesterol and blood pressure. This information is not intended to replace advice given to you by your health care provider. Make sure you discuss any questions you have with your health care provider. Document Revised: 07/29/2018 Document Reviewed: 07/29/2018 Elsevier Patient Education  2020 [employer].

## 2019-09-23 ENCOUNTER — Encounter: Payer: Self-pay | Admitting: Internal Medicine

## 2019-09-23 ENCOUNTER — Ambulatory Visit: Payer: Medicare HMO

## 2019-09-23 ENCOUNTER — Other Ambulatory Visit: Payer: Self-pay

## 2019-09-23 ENCOUNTER — Ambulatory Visit (INDEPENDENT_AMBULATORY_CARE_PROVIDER_SITE_OTHER): Payer: Medicare HMO | Admitting: Internal Medicine

## 2019-09-23 VITALS — BP 136/82 | HR 83 | Temp 97.7°F | Resp 16 | Ht 60.5 in | Wt 121.0 lb

## 2019-09-23 DIAGNOSIS — E038 Other specified hypothyroidism: Secondary | ICD-10-CM

## 2019-09-23 DIAGNOSIS — R339 Retention of urine, unspecified: Secondary | ICD-10-CM | POA: Insufficient documentation

## 2019-09-23 DIAGNOSIS — Z0000 Encounter for general adult medical examination without abnormal findings: Secondary | ICD-10-CM | POA: Diagnosis not present

## 2019-09-23 DIAGNOSIS — R944 Abnormal results of kidney function studies: Secondary | ICD-10-CM | POA: Insufficient documentation

## 2019-09-23 DIAGNOSIS — E782 Mixed hyperlipidemia: Secondary | ICD-10-CM

## 2019-09-23 DIAGNOSIS — M85869 Other specified disorders of bone density and structure, unspecified lower leg: Secondary | ICD-10-CM | POA: Diagnosis not present

## 2019-09-23 MED ORDER — IPRATROPIUM BROMIDE 0.03 % NA SOLN: 1.0000 | Freq: Four times a day (QID) | NASAL | 1 refills | Status: DC

## 2019-09-23 MED ORDER — PANTOPRAZOLE SODIUM 40 MG PO TBEC: 40.0000 mg | DELAYED_RELEASE_TABLET | Freq: Every day | ORAL | 3 refills | Status: DC

## 2019-09-23 NOTE — Assessment & Plan Note (Signed)
chronic dexa is scheduled Stressed regular exercise Taking calcium and vitamin d

## 2019-09-23 NOTE — Assessment & Plan Note (Signed)
Chronic Not currently on medication Lipids higher than ideal, especially given age Discussed statins She would like to try a natural supplement first Recheck lipids in 6 months Encouraged regular exercise

## 2019-09-23 NOTE — Assessment & Plan Note (Signed)
Acute 6 months of difficulty urinating and incomplete bladder emptying.  ?  Related to Zyrtec-D She will start taking Zyrtec knee Increase fluids intake, especially water Recheck BMP next week If kidney function still decreased will need imaging

## 2019-09-23 NOTE — Assessment & Plan Note (Signed)
Acute Possibly related to urinary retention secondary to Zyrtec-D Denies frequent NSAID use Does not drink enough fluids and will try increasing her fluid intake Repeat BMP next week after discontinuing Zyrtec-D

## 2019-09-23 NOTE — Assessment & Plan Note (Signed)
Chronic  Clinically euthyroid Check tsh  Titrate med dose if needed  

## 2019-09-27 ENCOUNTER — Ambulatory Visit: Payer: Medicare HMO | Admitting: Gastroenterology

## 2019-09-27 ENCOUNTER — Other Ambulatory Visit: Payer: Self-pay

## 2019-09-27 ENCOUNTER — Other Ambulatory Visit (INDEPENDENT_AMBULATORY_CARE_PROVIDER_SITE_OTHER): Payer: Medicare HMO

## 2019-09-27 ENCOUNTER — Encounter: Payer: Self-pay | Admitting: Gastroenterology

## 2019-09-27 VITALS — BP 120/70 | HR 74 | Temp 97.8°F | Ht 60.0 in | Wt 121.0 lb

## 2019-09-27 DIAGNOSIS — R944 Abnormal results of kidney function studies: Secondary | ICD-10-CM

## 2019-09-27 DIAGNOSIS — K219 Gastro-esophageal reflux disease without esophagitis: Secondary | ICD-10-CM | POA: Diagnosis not present

## 2019-09-27 DIAGNOSIS — R0982 Postnasal drip: Secondary | ICD-10-CM

## 2019-09-27 DIAGNOSIS — Z1211 Encounter for screening for malignant neoplasm of colon: Secondary | ICD-10-CM | POA: Diagnosis not present

## 2019-09-27 DIAGNOSIS — R142 Eructation: Secondary | ICD-10-CM

## 2019-09-27 LAB — BASIC METABOLIC PANEL
BUN: 24 mg/dL — ABNORMAL HIGH (ref 6–23)
CO2: 26 mEq/L (ref 19–32)
Calcium: 10.1 mg/dL (ref 8.4–10.5)
Chloride: 102 mEq/L (ref 96–112)
Creatinine, Ser: 1.1 mg/dL (ref 0.40–1.20)
GFR: 48.13 mL/min — ABNORMAL LOW (ref >60.00)
Glucose, Bld: 96 mg/dL (ref 70–99)
Potassium: 4.3 mEq/L (ref 3.5–5.1)
Sodium: 136 mEq/L (ref 135–145)

## 2019-09-27 NOTE — Patient Instructions (Addendum)
If you are age 78 or older, your body mass index should be between 23-30. Your Body mass index is 23.63 kg/m. If this is out of the aforementioned range listed, please consider follow up with your Primary Care Provider.  If you are age 9 or younger, your body mass index should be between 19-25. Your Body mass index is 23.63 kg/m. If this is out of the aformentioned range listed, please consider follow up with your Primary Care Provider.   Decrease Protonix to 20mg  once a day.  You can use Gas-X (simethicone) over the counter several times a day.  We are giving you a Low FOD-MAP diet to review and follow.  Please follow up as needed.   Thank you for entrusting me with your care and for choosing [facility], Dr. Lacona Cellar

## 2019-09-27 NOTE — Progress Notes (Signed)
HPI :  79 year old female with a reported history of GERD, breast cancer, postnasal drip and cough, here to reestablish care, referred by Billey Gosling for history of GERD and belching.  She was last seen by PA Alonza Bogus on 04/16/2016.  Patient was remotely followed by Dr. Olevia Perches.  The patient states she has been having sinus issues, reported chronic sinusitis and postnasal drip.  She states this gives her chronic sense of clearing her throat.  At one point time she developed some shortness of breath and was referred to see Dr. Melvyn Novas of pulmonary.  She states she was given an inhaler and a course of steroids and her cough has resolved.  During this time she was also started on pantoprazole and famotidine for possible component of reflux.  She states she historically has had occasional pyrosis and reflux symptoms but she has had no reflux at all since being on the regimen.  Her main symptom that she actually has complained of in regards to her reflux has been belching.  She states she has both belching from her upper tract as well as bloating and gas pains and passing gas from below.  She will often notice this after she eats a meal or drinks something.  Seems to be bothering her most so recently.  She did have some relief of her typical reflux symptoms with the pantoprazole dosed at 40 mg a day and the Pepcid at night, however this has not really helped her belching too much.  She does not have any abdominal pains that bother her.  She generally moves her bowels fairly regularly, has had some loose stools recently.  No blood in the stools.  She has no family history of colon cancer or gastric cancer.  Her last colonoscopy was performed in August 2010 which was normal.  She states she had a Cologuard test that was negative within the past year or 2.  Of note she does have a history of osteopenia and is scheduled to have another DEXA scan soon.  She historically has had a negative celiac test and normal IgA  for symptoms of bloating.  Looking through her chart this does not appear to be a new complaint.  She was given probiotics in the past for his bloating symptoms.  No weight loss or alarm symptoms otherwise.   Prior endoscopic evaluation: EGD 03/29/2015 - multiple benign gastric polyps, otherwise normal - benign fundic gland polyp  Colonoscopy 04/12/2009 - mild diverticulosis, otherwise normal - Dr. Olevia Perches  Colonoscopy 2003 - normal, mild diverticulosis     Past Medical History:  Diagnosis Date  . Actinic keratosis   . Allergic rhinitis   . BACK PAIN   . 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 Cervicalgia   . Cholelithiasis   . DIVERTICULOSIS, COLON   . ENDOMETRIOSIS   . GERD (gastroesophageal reflux disease)   . Heart palpitations   . Hypertension   . HYPOTHYROIDISM   . Left wrist fracture 11/06/2016   10/2016 - fell of high stool  . Personal history of chemotherapy   . Personal history of radiation therapy   . Pleural plaque without asbestos    CT chest 10/2010: R>L lobes, upper> lower  . Squamous cell carcinoma    History of BC     Past Surgical History:  Procedure Laterality Date  . BREAST LUMPECTOMY  2001   left  . CATARACT EXTRACTION Bilateral 09/2014   both  eyes  . CHOLECYSTECTOMY  2010  . ENDOMETRIAL ABLATION    . EXCISION / BIOPSY BREAST / NIPPLE / DUCT Left 2001  . TUBAL LIGATION     Family History  Problem Relation Age of Onset  . Heart disease Father   . Emphysema Father        smoked and worked with asbestos  . Dementia Mother   . Colon cancer Neg Hx   . Esophageal cancer Neg Hx   . Rectal cancer Neg Hx    Social History   Tobacco Use  . Smoking status: Never Smoker  . Smokeless tobacco: Never Used  Substance Use Topics  . Alcohol use: No  . Drug use: No   Current Outpatient Medications  Medication Sig Dispense Refill  . atenolol (TENORMIN) 25 MG tablet Take 1 tablet by mouth daily. 90 tablet  1  . benzonatate (TESSALON) 200 MG capsule TAKE 1 CAPSULE BY MOUTH EVERY DAY 3 TIMES A DAY AS NEEDED FOR COUGH 40 capsule 0  . Calcium-Vitamin D-Vitamin K (CALCIUM + D + K) [id number] MG-UNT-MCG TABS Take 1 tablet by mouth 2 (two) times daily.      . cholecalciferol (VITAMIN D) 25 MCG (1000 UNIT) tablet Take 1,000 Units by mouth daily.    . famotidine (PEPCID) 20 MG tablet One after supper 30 tablet 11  . ipratropium (ATROVENT) 0.03 % nasal spray Place 1 spray into both nostrils 4 (four) times daily. 90 mL 1  . levothyroxine (SYNTHROID) 75 MCG tablet TAKE 1 TABLET BY MOUTH EVERY DAY 90 tablet 1  . pantoprazole (PROTONIX) 40 MG tablet Take 1 tablet (40 mg total) by mouth daily. 30 tablet 3  . VALERIAN ROOT PLUS PO Take by mouth.    Marland Kitchen ZYRTEC-D ALLERGY & CONGESTION 5-120 MG tablet Take 1 tablet by mouth 2 (two) times daily.     No current facility-administered medications for this visit.   Allergies  Allergen Reactions  . Levaquin [Levofloxacin] Swelling and Other (See Comments)    Hallucinations, swelling of throat  . Nexium [Esomeprazole Magnesium]     diarrhea  . Prednisone     Facial flushing     Review of Systems: All systems reviewed and negative except where noted in HPI.   Lab Results  Component Value Date   WBC 9.4 09/21/2019   HGB 14.2 09/21/2019   HCT 42.9 09/21/2019   MCV 92.4 09/21/2019   PLT 213.0 09/21/2019    Lab Results  Component Value Date   CREATININE 1.10 09/27/2019   BUN 24 (H) 09/27/2019   NA 136 09/27/2019   K 4.3 09/27/2019   CL 102 09/27/2019   CO2 26 09/27/2019    Lab Results  Component Value Date   ALT 22 09/21/2019   AST 19 09/21/2019   ALKPHOS 77 09/21/2019   BILITOT 0.4 09/21/2019     Physical Exam: BP 120/70   Pulse 74   Temp 97.8 F (36.6 C)   Ht 5' (1.524 m)   Wt 121 lb (54.9 kg)   BMI 23.63 kg/m  Constitutional: Pleasant,well-developed, female in no acute distress. HEENT: Normocephalic and atraumatic. Conjunctivae are  normal. No scleral icterus. Neck supple.  Cardiovascular: Normal rate, regular rhythm.  Pulmonary/chest: Effort normal and breath sounds normal. No wheezing, rales or rhonchi. Abdominal: Soft, nondistended, nontender.  There are no masses palpable. No hepatomegaly. Extremities: no edema Lymphadenopathy: No cervical adenopathy noted. Neurological: Alert and oriented to person place and time. Skin: Skin is warm and dry.  No rashes noted. Psychiatric: Normal mood and affect. Behavior is normal.   ASSESSMENT AND PLAN: 79 year old female here for establishment of care to discuss the following:  Belching / GERD -  she was started on PPI and more aggressive antacid regimen to treat reflux more aggressively as a component of chronic cough as she did have some historical mild reflux symptoms.  The more typical reflux symptoms of pyrosis are well controlled on Protonix 40 mg a day, and she states her cough got better quite quickly with the steroids and inhaler.  Her main complaint that she describes to me today is belching and gas pains.  We discussed what typical reflux symptoms are, I also discussed intestinal gas and belching with her, as well as supra gastric belching. I don't think reflux is the primary driver of these symptoms right now, PPI is unlikely to resolve the belching.  I reviewed her most recent EGD, no hiatal hernia was reported.  She had no gastric pathology that was concerning.  Given her belching occurs after she eats, possible she has air swallowing with meals.  I reviewed her diet, she is not drinking much carbonated beverages.  She inquires about long-term regimen for her reflux in light of long-term risks of PPI.  I did counsel her that PPI use can increase the risk for fractures and she does have a history of osteopenia.  If her typical reflux symptoms are controlled, and her cough is controlled by her inhaler, I would recommend reducing her Protonix to 20 mg once a day.  In regards to  her treatment of belching and bloating, we reviewed a low FODMAP diet to help reduce intestinal gas.  I think she should use simethicone a few times a day over-the-counter to see if this will help reduce her belching / bloating.  If symptoms persist over time, may consider another upper tract assessment to assess for the occurrence of a large hiatal hernia, or consider a trial of baclofen if belching is the most bothersome symptom.  The patient subjectively feels that chronic sinus issues and postnasal drip may more likely be causing her throat clearing.  She does not have other more prominent typical reflux symptoms to make me think reflux is driving this entire process.  If that question persists over time and her symptoms fail to respond, I would consider a 24-hour pH test to more formally answer that question.  She agreed with the plan, I asked her to touch base with me in few weeks to let me know how she is doing.  Colon cancer screening - last colonoscopy in 2010 was normal.  She had a negative Cologuard reportedly in the last year or so.  Given her age, and lack of bowel changes or alarm symptoms, I do not feel any further colon cancer screening is warranted.  After discussion of this issue she agreed.  She can follow-up as needed if she develops any concerning symptoms.  I spent 45 minutes of time, including in depth chart review, independent review of results as outlined above, communicating results with the patient directly, face-to-face time with the patient, coordinating care, ordering studies and medications as appropriate, and documenting this encounter.   [place] Cellar, MD [facility]  CC: Binnie Rail, MD

## 2019-09-28 ENCOUNTER — Other Ambulatory Visit: Payer: Self-pay

## 2019-09-28 ENCOUNTER — Ambulatory Visit
Admission: RE | Admit: 2019-09-28 | Discharge: 2019-09-28 | Disposition: A | Discharge status: Home / Self Care | Payer: Medicare HMO | Source: Ambulatory Visit | Attending: Obstetrics and Gynecology | Admitting: Obstetrics and Gynecology

## 2019-09-28 ENCOUNTER — Encounter: Payer: Self-pay | Admitting: Internal Medicine

## 2019-09-28 DIAGNOSIS — M8580 Other specified disorders of bone density and structure, unspecified site: Secondary | ICD-10-CM

## 2019-09-28 DIAGNOSIS — Z780 Asymptomatic menopausal state: Secondary | ICD-10-CM | POA: Diagnosis not present

## 2019-09-28 DIAGNOSIS — M810 Age-related osteoporosis without current pathological fracture: Secondary | ICD-10-CM | POA: Diagnosis not present

## 2019-09-28 DIAGNOSIS — M85851 Other specified disorders of bone density and structure, right thigh: Secondary | ICD-10-CM | POA: Diagnosis not present

## 2019-09-29 ENCOUNTER — Ambulatory Visit: Payer: Medicare HMO | Attending: Internal Medicine

## 2019-09-29 DIAGNOSIS — Z23 Encounter for immunization: Secondary | ICD-10-CM | POA: Insufficient documentation

## 2019-09-29 NOTE — Progress Notes (Signed)
   Covid-19 Vaccination Clinic  Name:  Emily [name]    MRN: [id number] DOB: Nov 12, 1940  09/29/2019  [name] was observed post Covid-19 immunization for 15 minutes without incidence. She was provided with Vaccine Information Sheet and instruction to access the V-Safe system.   [name] was instructed to call 911 with any severe reactions post vaccine: Marland Kitchen Difficulty breathing  . Swelling of your face and throat  . A fast heartbeat  . A bad rash all over your body  . Dizziness and weakness    Immunizations Administered    Name Date Dose VIS Date Route   Pfizer COVID-19 Vaccine 09/29/2019 12:22 PM 0.3 mL 07/30/2019 Intramuscular   Manufacturer: [place]   Lot: [id number]   [place]: [id number]

## 2019-10-11 ENCOUNTER — Encounter: Payer: Self-pay | Admitting: Internal Medicine

## 2019-10-11 DIAGNOSIS — R0781 Pleurodynia: Secondary | ICD-10-CM | POA: Diagnosis not present

## 2019-10-11 NOTE — Telephone Encounter (Signed)
Called pt in regards to try to get an appointment scheduled today. She has an appointment with [facility].

## 2019-10-18 ENCOUNTER — Encounter: Payer: Self-pay | Admitting: Internal Medicine

## 2019-10-22 ENCOUNTER — Telehealth: Payer: Self-pay

## 2019-10-22 NOTE — Telephone Encounter (Signed)
Agree with Benadryl.

## 2019-10-22 NOTE — Telephone Encounter (Signed)
Patient's husband calling and spoke to [facility] on 10/21/2019 6:22:38 PM and states that his wife took [place] today and is having an allergic reaction. She gets this way with Prednisone also. Her skin is red on her face and on her neck and chest. Slight swelling on her face. She is able to swallow. She is concerned and asking if she can take a Benadryl that has worked in the past.   Advised to take benadryl 4 times per day until seen and to follow up with PCP within 3 days.

## 2019-10-25 ENCOUNTER — Telehealth: Payer: Self-pay | Admitting: Internal Medicine

## 2019-10-25 MED ORDER — ATENOLOL 25 MG PO TABS: ORAL_TABLET | ORAL | 1 refills | Status: DC

## 2019-10-25 NOTE — Telephone Encounter (Signed)
° °  1.Medication Requested: atenolol (TENORMIN) 25 MG tablet  2. Pharmacy (Name, [facility]): [id number] - [place], [place]. AT [address]  3. On Med List: yes     Agent: Please be advised that RX refills may take up to 3 business days. We ask that you follow-up with your pharmacy.

## 2019-10-25 NOTE — Telephone Encounter (Signed)
Rx sent 

## 2019-11-09 DIAGNOSIS — R69 Illness, unspecified: Secondary | ICD-10-CM | POA: Diagnosis not present

## 2019-11-10 ENCOUNTER — Ambulatory Visit: Payer: Medicare HMO

## 2019-11-15 ENCOUNTER — Other Ambulatory Visit: Payer: Self-pay | Admitting: Internal Medicine

## 2019-11-17 ENCOUNTER — Other Ambulatory Visit: Payer: Self-pay

## 2019-11-17 ENCOUNTER — Encounter: Payer: Self-pay | Admitting: Internal Medicine

## 2019-11-17 ENCOUNTER — Ambulatory Visit (INDEPENDENT_AMBULATORY_CARE_PROVIDER_SITE_OTHER): Payer: Medicare HMO | Admitting: Internal Medicine

## 2019-11-17 VITALS — BP 120/70 | HR 54 | Temp 98.0°F | Resp 16 | Ht 60.0 in | Wt 123.0 lb

## 2019-11-17 DIAGNOSIS — R195 Other fecal abnormalities: Secondary | ICD-10-CM | POA: Insufficient documentation

## 2019-11-17 DIAGNOSIS — R0789 Other chest pain: Secondary | ICD-10-CM | POA: Diagnosis not present

## 2019-11-17 NOTE — Assessment & Plan Note (Signed)
Acute Started yesterday - tender to touch, worse with deep breaths, not increased with movement Sounds msk in nature - rib, cartilage or breast ? Related to GERD Tylenol  Can increase pepcid BID, can take maalox Discussed xray or ribs, mammo if no improvement

## 2019-11-17 NOTE — Assessment & Plan Note (Signed)
Acute Two days ago had loose stool and two episodes yesterday - lots of gas No pain, blood or fever Normal stool this morning ? Related to something she ate or changing to whole milk Monitor for now - call if symptoms persist

## 2019-11-17 NOTE — Progress Notes (Signed)
Subjective:    Patient ID: Emily [name], female    DOB: 1940/10/02, 79 y.o.   MRN: [id number]  HPI The patient is here for an acute visit.   She fell 2/14 and fractured a left rib and that is feeling better.  She saw ortho at the time.     Yesterday she started getting right sternum/anterior rib pain.  It is a contant pain with intermittent sharp pain.  She denies injury or activity that may have caused it.  She had a sharp burning sensation when it first started and has constant pain.  She wonders if it could be a hiatal hernia.    The last couple of days she has had increased gas and loose stools. The loose stools occurred Monday once and yesterday twice.  Today she had a normal stool.    No change in meds, supplements and diet.  The only change in diet was switching to whole milk the beginning of March.  She did not have any issues prior to a couple of days so does not think that is related. She is taking pepcid nightly. She wonders it this pain is heartburn.   Medications and allergies reviewed with patient and updated if appropriate.  Patient Active Problem List   Diagnosis Date Noted  . Urinary retention 09/23/2019  . Decreased GFR 09/23/2019  . SOB (shortness of breath) 01/15/2019  . Upper airway cough syndrome 01/15/2019  . Belching 12/14/2018  . Reactive airway disease 12/05/2018  . Pre-syncope 05/19/2018  . DDD (degenerative disc disease), cervical 01/15/2018  . Neuralgia 09/12/2017  . Memory difficulties 11/06/2016  . IBS (irritable bowel syndrome) 04/16/2016  . Hyperlipidemia 09/10/2015  . Osteopenia 09/08/2015  . Palpitations   . Pleural plaque without asbestos 12/03/2010  . Cervicalgia 12/18/2009  . DIVERTICULOSIS, COLON 03/02/2009  . DYSPLASTIC NEVUS, [place] 12/06/2008  . Lumbago 03/11/2008  . Actinic keratosis 01/23/2008  . CARCINOMA, BREAST, ESTROGEN RECEPTOR NEGATIVE 10/12/2007  . Hypothyroidism 06/18/2007  . Allergic rhinitis 06/11/2007     Current Outpatient Medications on File Prior to Visit  Medication Sig Dispense Refill  . atenolol (TENORMIN) 25 MG tablet Take 1 tablet by mouth daily. 90 tablet 1  . benzonatate (TESSALON) 200 MG capsule TAKE 1 CAPSULE BY MOUTH EVERY DAY 3 TIMES A DAY AS NEEDED FOR COUGH 40 capsule 0  . Calcium-Vitamin D-Vitamin K (CALCIUM + D + K) [id number] MG-UNT-MCG TABS Take 1 tablet by mouth 2 (two) times daily.      . cholecalciferol (VITAMIN D) 25 MCG (1000 UNIT) tablet Take 1,000 Units by mouth daily.    . famotidine (PEPCID) 20 MG tablet One after supper 30 tablet 11  . ipratropium (ATROVENT) 0.03 % nasal spray Place 1 spray into both nostrils 4 (four) times daily. 90 mL 1  . levothyroxine (SYNTHROID) 75 MCG tablet TAKE 1 TABLET BY MOUTH EVERY DAY 90 tablet 1  . VALERIAN ROOT PLUS PO Take by mouth.     No current facility-administered medications on file prior to visit.    Past Medical History:  Diagnosis Date  . Actinic keratosis   . Allergic rhinitis   . BACK PAIN   . 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 Cervicalgia   . Cholelithiasis   . DIVERTICULOSIS, COLON   . ENDOMETRIOSIS   . GERD (gastroesophageal reflux disease)   . Heart palpitations   . Hypertension   . HYPOTHYROIDISM   .  Left wrist fracture 11/06/2016   10/2016 - fell of high stool  . Personal history of chemotherapy   . Personal history of radiation therapy   . Pleural plaque without asbestos    CT chest 10/2010: R>L lobes, upper> lower  . Squamous cell carcinoma    History of BC    Past Surgical History:  Procedure Laterality Date  . BREAST LUMPECTOMY  2001   left  . CATARACT EXTRACTION Bilateral 09/2014   both eyes  . CHOLECYSTECTOMY  2010  . ENDOMETRIAL ABLATION    . EXCISION / BIOPSY BREAST / NIPPLE / DUCT Left 2001  . TUBAL LIGATION      Social History   Socioeconomic History  . Marital status: Married    Spouse name: Not on file  . Number of  children: 2  . Years of education: Not on file  . Highest education level: Not on file  Occupational History  . Occupation: Retired    Comment: Chiropractor  Tobacco Use  . Smoking status: Never Smoker  . Smokeless tobacco: Never Used  Substance and Sexual Activity  . Alcohol use: No  . Drug use: No  . Sexual activity: Never  Other Topics Concern  . Not on file  Social History Narrative   Married, lives in [place] area since 2008 from [place] to be close to dtr   Social Determinants of Health   Financial Resource Strain:   . Difficulty of Paying Living Expenses:   Food Insecurity:   . Worried About Charity fundraiser in the Last Year:   . Arboriculturist in the Last Year:   Transportation Needs:   . Film/video editor (Medical):   Marland Kitchen Lack of Transportation (Non-Medical):   Physical Activity:   . Days of Exercise per Week:   . Minutes of Exercise per Session:   Stress:   . Feeling of Stress :   Social Connections:   . Frequency of Communication with Friends and Family:   . Frequency of Social Gatherings with Friends and Family:   . Attends Religious Services:   . Active Member of Clubs or Organizations:   . Attends Archivist Meetings:   Marland Kitchen Marital Status:     Family History  Problem Relation Age of Onset  . Heart disease Father   . Emphysema Father        smoked and worked with asbestos  . Dementia Mother   . Colon cancer Neg Hx   . Esophageal cancer Neg Hx   . Rectal cancer Neg Hx     Review of Systems  Constitutional: Negative for chills and fever.  Respiratory: Negative for shortness of breath.   Cardiovascular: Negative for palpitations.  Gastrointestinal: Positive for diarrhea. Negative for abdominal pain, blood in stool (no black stool) and nausea.       ? Burning sensation with first sharp pain  Genitourinary: Negative for dysuria and hematuria.       Objective:   Vitals:   11/17/19 1342  BP: 120/70  Pulse: (!) 54  Resp: 16   Temp: 98 F (36.7 C)  SpO2: 99%   BP Readings from Last 3 Encounters:  11/17/19 120/70  09/27/19 120/70  09/23/19 136/82   Wt Readings from Last 3 Encounters:  11/17/19 123 lb (55.8 kg)  09/27/19 121 lb (54.9 kg)  09/23/19 121 lb (54.9 kg)   Body mass index is 24.02 kg/m.   Physical Exam    Constitutional: Appears well-developed  and well-nourished. No distress.  Head: Normocephalic and atraumatic.  Neck: Neck supple. No tracheal deviation present. No thyromegaly present.  No cervical lymphadenopathy Cardiovascular: Normal rate, regular rhythm and normal heart sounds.  No murmur heard. No carotid bruit .  No edema Pulmonary/Chest: pain with palpation lower-medial right breast/rib area - no palpable lump - no skin changes -increased pain with deep breaths, none with movement.  Effort normal and breath sounds normal. No respiratory distress. No has no wheezes. No rales.  Abdomen; soft, NT, ND Skin: Skin is warm and dry. Not diaphoretic.  Psychiatric: Normal mood and affect. Behavior is normal.       Assessment & Plan:    See Problem List for Assessment and Plan of chronic medical problems.    This visit occurred during the SARS-CoV-2 public health emergency.  Safety protocols were in place, including screening questions prior to the visit, additional usage of staff PPE, and extensive cleaning of exam room while observing appropriate contact time as indicated for disinfecting solutions.

## 2019-11-17 NOTE — Patient Instructions (Addendum)
Can increase pepcid to twice daily.  Can try Maalox.     Try taking tylenol for the pain.   Continue gas-ex.   Please call if there is no improvement in your symptoms.

## 2019-11-26 ENCOUNTER — Other Ambulatory Visit: Payer: Self-pay | Admitting: Obstetrics and Gynecology

## 2019-11-26 DIAGNOSIS — Z1231 Encounter for screening mammogram for malignant neoplasm of breast: Secondary | ICD-10-CM

## 2019-12-02 ENCOUNTER — Ambulatory Visit: Payer: Medicare HMO | Admitting: Physician Assistant

## 2019-12-07 DIAGNOSIS — H43813 Vitreous degeneration, bilateral: Secondary | ICD-10-CM | POA: Diagnosis not present

## 2019-12-07 DIAGNOSIS — H35372 Puckering of macula, left eye: Secondary | ICD-10-CM | POA: Diagnosis not present

## 2019-12-07 DIAGNOSIS — H40013 Open angle with borderline findings, low risk, bilateral: Secondary | ICD-10-CM | POA: Diagnosis not present

## 2019-12-29 ENCOUNTER — Ambulatory Visit: Payer: Medicare HMO | Attending: Internal Medicine

## 2019-12-29 DIAGNOSIS — Z20822 Contact with and (suspected) exposure to covid-19: Secondary | ICD-10-CM | POA: Diagnosis not present

## 2019-12-30 ENCOUNTER — Ambulatory Visit: Payer: Medicare HMO | Admitting: Nurse Practitioner

## 2019-12-30 ENCOUNTER — Encounter: Payer: Self-pay | Admitting: Nurse Practitioner

## 2019-12-30 VITALS — BP 124/70 | HR 68 | Temp 98.0°F | Ht 60.0 in | Wt 121.6 lb

## 2019-12-30 DIAGNOSIS — R194 Change in bowel habit: Secondary | ICD-10-CM | POA: Diagnosis not present

## 2019-12-30 LAB — NOVEL CORONAVIRUS, NAA: SARS-CoV-2, NAA: NOT DETECTED

## 2019-12-30 LAB — SARS-COV-2, NAA 2 DAY TAT

## 2019-12-30 NOTE — Patient Instructions (Signed)
If you are age 78 or older, your body mass index should be between 23-30. Your Body mass index is 23.75 kg/m. If this is out of the aforementioned range listed, please consider follow up with your Primary Care Provider.  If you are age 27 or younger, your body mass index should be between 19-25. Your Body mass index is 23.75 kg/m. If this is out of the aformentioned range listed, please consider follow up with your Primary Care Provider.   Call the office if you continue to have any issues.

## 2019-12-30 NOTE — Progress Notes (Signed)
IMPRESSION and PLAN:    79 yo female with pmh significant for left breat cancer s/p lumpectomy and chemoradiation in 2001, hypothyroidism, hypertension.   # Bowel changes --Over the last year has begun having a 2nd BM in afternoon. The BM is usually soft , sometimes loose. The extra BM in afternoon is not overly concerning at this point. Stool doesn't contain blood. She has no abdominal pain or weight loss. Recent labs look okay. She didn't have that extra BM when away in [place] eating different food.  --Last colonoscopy was in 2010 but had negative Cologuard within last 1-2 years.  --Asked her to keep a food diary, look for culprits.  --Call for additional bowel changes,  blood in stool or abdominal pain at which point we can schedule for colonoscopy. Marland Kitchen   HPI:    Primary GI: Rich Hill Cellar, MD   Chief complaint : bowel changes  Patient is here to ask about a bowel change and whether or not she should be concerned.  He generally has a bowel movement in the morning but over the last year has had an extra bowel movement in the afternoon.  That bowel movement consist of stool which is softer than a.m. stool.  It also can sometimes be loose.  She does not use artificial sweeteners.  Wonders if it might be the food she eats in the morning for breakfast . She has not seen any blood in her stool.  She has no associated abdominal pain.  No nausea, vomiting.  No weight loss.  She cannot correlate bowel changes with any medication changes.  She has not made any dietary changes.  Patient went to [place] last week and noticed that while away from home she didn't have a second bm during the day .   TSH in February of this year was normal.  CMP okay except for abnormal renal function which improved upon recheck a few days later. CBC was normal  Of note, patient called the office late March with right upper quadrant pain.  I asked her about that today.  Patient says she feels like it was  gas pain, it lasted a few days then resolved and never recurred  Review of systems:     No chest pain, no SOB, no fevers, no urinary sx   Past Medical History:  Diagnosis Date  . Actinic keratosis   . Allergic rhinitis   . BACK PAIN   . 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 Cervicalgia   . Cholelithiasis   . DIVERTICULOSIS, COLON   . ENDOMETRIOSIS   . GERD (gastroesophageal reflux disease)   . Heart palpitations   . Hypertension   . HYPOTHYROIDISM   . Left wrist fracture 11/06/2016   10/2016 - fell of high stool  . Personal history of chemotherapy   . Personal history of radiation therapy   . Pleural plaque without asbestos    CT chest 10/2010: R>L lobes, upper> lower  . Squamous cell carcinoma    History of BC    Patient's surgical history, family medical history, social history, medications and allergies were all reviewed in Epic   Creatinine clearance cannot be calculated (Patient's most recent lab result is older than the maximum 21 days allowed.)  Current Outpatient Medications  Medication Sig Dispense Refill  . atenolol (TENORMIN) 25 MG tablet Take 1 tablet by mouth daily. 90 tablet 1  .  Calcium-Vitamin D-Vitamin K (CALCIUM + D + K) [id number] MG-UNT-MCG TABS Take 1 tablet by mouth 2 (two) times daily.      . cholecalciferol (VITAMIN D) 25 MCG (1000 UNIT) tablet Take 1,000 Units by mouth daily.    Marland Kitchen ipratropium (ATROVENT) 0.03 % nasal spray Place 1 spray into both nostrils 4 (four) times daily. 90 mL 1  . levothyroxine (SYNTHROID) 75 MCG tablet TAKE 1 TABLET BY MOUTH EVERY DAY 90 tablet 1  . VALERIAN ROOT PLUS PO Take by mouth.     No current facility-administered medications for this visit.    Physical Exam:     BP 124/70   Pulse 68   Temp 98 F (36.7 C)   Ht 5' (1.524 m)   Wt 121 lb 9.6 oz (55.2 kg)   BMI 23.75 kg/m   GENERAL:  Pleasant female in NAD PSYCH: : Cooperative, normal affect CARDIAC:   RRR PULM: Normal respiratory effort, lungs CTA bilaterally, no wheezing ABDOMEN:  Nondistended, soft, nontender. No obvious masses, no hepatomegaly,  normal bowel sounds SKIN:  turgor, no lesions seen Musculoskeletal:  Normal muscle tone, normal strength NEURO: Alert and oriented x 3, no focal neurologic deficits  I spent 30 minutes total reviewing records, obtaining history, performing exam, counseling patient and documenting visit / findings.   Tye Savoy , NP 12/30/2019, 9:40 AM

## 2020-01-04 DIAGNOSIS — H43813 Vitreous degeneration, bilateral: Secondary | ICD-10-CM | POA: Diagnosis not present

## 2020-01-04 DIAGNOSIS — H35372 Puckering of macula, left eye: Secondary | ICD-10-CM | POA: Diagnosis not present

## 2020-01-04 DIAGNOSIS — H40013 Open angle with borderline findings, low risk, bilateral: Secondary | ICD-10-CM | POA: Diagnosis not present

## 2020-01-05 ENCOUNTER — Telehealth: Payer: Self-pay | Admitting: Cardiology

## 2020-01-05 DIAGNOSIS — Z01419 Encounter for gynecological examination (general) (routine) without abnormal findings: Secondary | ICD-10-CM | POA: Diagnosis not present

## 2020-01-05 NOTE — Telephone Encounter (Signed)
Spoke with pt and had an OB appt and was told heart was irreg and needed to f/u with card Per pt felt fine no symptoms Pt taking atenolol 25 mg every day and no change in caffeine intake Per las office note is a prn f/u Will forward to Dr Harrell Gave for review and recommendations./cy

## 2020-01-05 NOTE — Telephone Encounter (Signed)
Patient c/o Palpitations:  High priority if patient c/o lightheadedness, shortness of breath, or chest pain  1) How long have you had palpitations/irregular HR/ Afib? Are you having the symptoms now? Irregular HR since this AM and no  2) Are you currently experiencing lightheadedness, SOB or CP? No but had some lightheadedness this morning  3) Do you have a history of afib (atrial fibrillation) or irregular heart rhythm? Yes, irregular heart rhythm   4) Have you checked your BP or HR? (document readings if available): 118/68   5) Are you experiencing any other symptoms? No. Patient states that she went to her OBGYN today and was told by the dr that she could hear her heart skipping beats. She was advised to schedule an appt ASAP with her cardiologist. Dr. Harrell Gave does not have anything available until June nor the APP's.

## 2020-01-05 NOTE — Telephone Encounter (Signed)
Pt updated and verbalized understanding.  

## 2020-01-05 NOTE — Telephone Encounter (Signed)
She has paroxysmal SVT on prior monitor, but I cannot determine what the rhythm was without an ECG or other report at the time. It looks like she has PCP follow up tomorrow. If there is concern I am happy to see her in follow up, but she can also discuss with her PCP tomorrow to see if she needs additional cardiac follow up. Thanks.

## 2020-01-05 NOTE — Progress Notes (Signed)
Subjective:    Patient ID: Emily [name], female    DOB: 1941/06/18, 79 y.o.   MRN: [id number]  HPI The patient is here for an acute visit.   Irregular HR: she was at her Gyn's office and she had an irregular rhythm and was advised to see cardio or me. She denies any symptoms including palpitations, chest pressure, SOB and lightheadedness.    She has known first degree AV block.  She had a Holter monitor two years ago that showed SVT which was improved with the atenolol.   Medications and allergies reviewed with patient and updated if appropriate.  Patient Active Problem List   Diagnosis Date Noted  . Change in stool 11/17/2019  . Right-sided chest wall pain 11/17/2019  . Urinary retention 09/23/2019  . Decreased GFR 09/23/2019  . SOB (shortness of breath) 01/15/2019  . Upper airway cough syndrome 01/15/2019  . Belching 12/14/2018  . Reactive airway disease 12/05/2018  . Pre-syncope 05/19/2018  . DDD (degenerative disc disease), cervical 01/15/2018  . Neuralgia 09/12/2017  . Memory difficulties 11/06/2016  . IBS (irritable bowel syndrome) 04/16/2016  . Hyperlipidemia 09/10/2015  . Osteopenia 09/08/2015  . Palpitations   . Pleural plaque without asbestos 12/03/2010  . Cervicalgia 12/18/2009  . DIVERTICULOSIS, COLON 03/02/2009  . DYSPLASTIC NEVUS,  [place] 12/06/2008  . Lumbago 03/11/2008  . Actinic keratosis 01/23/2008  . CARCINOMA, BREAST, ESTROGEN RECEPTOR NEGATIVE 10/12/2007  . Hypothyroidism 06/18/2007  . Allergic rhinitis 06/11/2007    Current Outpatient Medications on File Prior to Visit  Medication Sig Dispense Refill  . atenolol (TENORMIN) 25 MG tablet Take 1 tablet by mouth daily. 90 tablet 1  . Calcium-Vitamin D-Vitamin K (CALCIUM + D + K) [id number] MG-UNT-MCG TABS Take 1 tablet by mouth 2 (two) times daily.      . cholecalciferol (VITAMIN D) 25 MCG (1000 UNIT) tablet Take 1,000 Units by mouth daily.    Marland Kitchen ipratropium (ATROVENT) 0.03 % nasal spray Place 1  spray into both nostrils 4 (four) times daily. 90 mL 1  . levothyroxine (SYNTHROID) 75 MCG tablet TAKE 1 TABLET BY MOUTH EVERY DAY 90 tablet 1  . VALERIAN ROOT PLUS PO Take by mouth.     No current facility-administered medications on file prior to visit.    Past Medical History:  Diagnosis Date  . Actinic keratosis   . Allergic rhinitis   . BACK PAIN   . 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 Cervicalgia   . Cholelithiasis   . DIVERTICULOSIS, COLON   . ENDOMETRIOSIS   . GERD (gastroesophageal reflux disease)   . Heart palpitations   . Hypertension   . HYPOTHYROIDISM   . Left wrist fracture 11/06/2016   10/2016 - fell of high stool  . Personal history of chemotherapy   . Personal history of radiation therapy   . Pleural plaque without asbestos    CT chest 10/2010: R>L lobes, upper> lower  . Squamous cell carcinoma    History of BC    Past Surgical History:  Procedure Laterality Date  . BREAST LUMPECTOMY  2001   left  . CATARACT EXTRACTION Bilateral 09/2014   both eyes  . CHOLECYSTECTOMY  2010  . ENDOMETRIAL ABLATION    . EXCISION / BIOPSY BREAST / NIPPLE / DUCT Left 2001  . TUBAL LIGATION      Social History   Socioeconomic History  . Marital status: Married    Spouse name:  Not on file  . Number of children: 2  . Years of education: Not on file  . Highest education level: Not on file  Occupational History  . Occupation: Retired    Comment: Chiropractor  Tobacco Use  . Smoking status: Never Smoker  . Smokeless tobacco: Never Used  Substance and Sexual Activity  . Alcohol use: No  . Drug use: No  . Sexual activity: Yes  Other Topics Concern  . Not on file  Social History Narrative   Married, lives in [place] area since 2008 from [place] to be close to dtr   Social Determinants of Health   Financial Resource Strain:   . Difficulty of Paying Living Expenses:   Food Insecurity:   . Worried About Paediatric nurse in the Last Year:   . Arboriculturist in the Last Year:   Transportation Needs:   . Film/video editor (Medical):   Marland Kitchen Lack of Transportation (Non-Medical):   Physical Activity:   . Days of Exercise per Week:   . Minutes of Exercise per Session:   Stress:   . Feeling of Stress :   Social Connections:   . Frequency of Communication with Friends and Family:   . Frequency of Social Gatherings with Friends and Family:   . Attends Religious Services:   . Active Member of Clubs or Organizations:   . Attends Archivist Meetings:   Marland Kitchen Marital Status:     Family History  Problem Relation Age of Onset  . Heart disease Father   . Emphysema Father        smoked and worked with asbestos  . Dementia Mother   . Colon cancer Neg Hx   . Esophageal cancer Neg Hx   . Rectal cancer Neg Hx     Review of Systems  Constitutional: Negative for fever.  Respiratory: Negative for cough, shortness of breath and wheezing.   Cardiovascular: Positive for palpitations. Negative for chest pain and leg swelling.  Neurological: Positive for light-headedness (occ with bending over, quick movements). Negative for headaches.       Objective:   Vitals:   01/06/20 0834  BP: 140/82  Pulse: 67  Resp: 16  Temp: 97.8 F (36.6 C)  SpO2: 99%   BP Readings from Last 3 Encounters:  01/06/20 140/82  12/30/19 124/70  11/17/19 120/70   Wt Readings from Last 3 Encounters:  01/06/20 121 lb (54.9 kg)  12/30/19 121 lb 9.6 oz (55.2 kg)  11/17/19 123 lb (55.8 kg)   Body mass index is 23.63 kg/m.   Physical Exam    Constitutional: Appears well-developed and well-nourished. No distress.  Head: Normocephalic and atraumatic.  Neck: Neck supple. No tracheal deviation present. No thyromegaly present.  No cervical lymphadenopathy Cardiovascular: Normal rate, regular rhythm with occ premature beats and normal heart sounds.  No murmur heard. No carotid bruit .  No edema Pulmonary/Chest:  Effort normal and breath sounds normal. No respiratory distress. No has no wheezes. No rales.  Skin: Skin is warm and dry. Not diaphoretic.  Psychiatric: Normal mood and affect. Behavior is normal.       Assessment & Plan:    See Problem List for Assessment and Plan of chronic medical problems.    This visit occurred during the SARS-CoV-2 public health emergency.  Safety protocols were in place, including screening questions prior to the visit, additional usage of staff PPE, and extensive cleaning of exam room while observing appropriate contact  time as indicated for disinfecting solutions.

## 2020-01-06 ENCOUNTER — Other Ambulatory Visit: Payer: Self-pay

## 2020-01-06 ENCOUNTER — Ambulatory Visit (INDEPENDENT_AMBULATORY_CARE_PROVIDER_SITE_OTHER): Payer: Medicare HMO | Admitting: Internal Medicine

## 2020-01-06 ENCOUNTER — Encounter: Payer: Self-pay | Admitting: Internal Medicine

## 2020-01-06 VITALS — BP 140/82 | HR 67 | Temp 97.8°F | Resp 16 | Ht 60.0 in | Wt 121.0 lb

## 2020-01-06 DIAGNOSIS — I499 Cardiac arrhythmia, unspecified: Secondary | ICD-10-CM | POA: Diagnosis not present

## 2020-01-06 DIAGNOSIS — Z0100 Encounter for examination of eyes and vision without abnormal findings: Secondary | ICD-10-CM | POA: Diagnosis not present

## 2020-01-06 NOTE — Assessment & Plan Note (Signed)
Acute Noted on exam at gyn's office Asymptomatic EKG today - sinus arrhythmia at 69 bpm, first degree AV block, LAE, no acute changes; sinus arrhythmia new compared to EKG from 2019 Continue atenolol ? More likely to develop afib or conduction issue in future Will have her f/u with cardiology Should have yearly EKG

## 2020-01-06 NOTE — Patient Instructions (Signed)
  An EKG was done today and looks ok.  Lets have you follow up with Cardiology to get her thoughts.  Her office will call you.

## 2020-01-24 ENCOUNTER — Other Ambulatory Visit: Payer: Self-pay

## 2020-01-24 ENCOUNTER — Encounter: Payer: Self-pay | Admitting: Cardiology

## 2020-01-24 ENCOUNTER — Ambulatory Visit: Payer: Medicare HMO | Admitting: Cardiology

## 2020-01-24 VITALS — BP 116/84 | HR 64 | Ht 61.0 in | Wt 121.8 lb

## 2020-01-24 DIAGNOSIS — Z7189 Other specified counseling: Secondary | ICD-10-CM | POA: Diagnosis not present

## 2020-01-24 DIAGNOSIS — I499 Cardiac arrhythmia, unspecified: Secondary | ICD-10-CM

## 2020-01-24 DIAGNOSIS — R002 Palpitations: Secondary | ICD-10-CM | POA: Diagnosis not present

## 2020-01-24 DIAGNOSIS — R011 Cardiac murmur, unspecified: Secondary | ICD-10-CM

## 2020-01-24 NOTE — Patient Instructions (Signed)

## 2020-01-24 NOTE — Progress Notes (Signed)
Cardiology Office Note:    Date:  01/24/2020   ID:  Emily [name], DOB 1941/07/26, MRN [id number]  PCP:  Binnie Rail, MD  Cardiologist:  Buford Dresser, MD PhD  Referring MD: Binnie Rail, MD   CC: follow up  History of Present Illness:    Emily [name] is a 79 y.o. female with a hx of palpitations, mitral valve prolapse who is seen for follow up. I initially met her 05/20/2018 as a new consult at the request of Burns, [name], MD for the evaluation and management of presyncope and palpitations.   Today: Note to have irregular heart beat at OB/Gyn appt. Followed up with a recent visit with Dr. Quay Burow 01/06/20. Noted on ECG to have sinus arrhythmia, first degree AV block. There was a question of whether this predisposes her to afib or conduction issues in the future.  Went to [employer], told her heart was skipping too many beats. Asymptomatic. Has been on atenolol chronically.She can feel occasional pauses but not bothersome to her. No high risk features.  ROS positive for intermittent left arm numbness, happened twice, once when on computer, once when in bed. Came back right away with changing position.   Denies chest pain, shortness of breath at rest or with normal exertion. No PND, orthopnea, LE edema or unexpected weight gain. No syncope. Palpitations much improved on atenolol.   Past Medical History:  Diagnosis Date  . Actinic keratosis   . Allergic rhinitis   . BACK PAIN   . 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 Cervicalgia   . Cholelithiasis   . DIVERTICULOSIS, COLON   . ENDOMETRIOSIS   . GERD (gastroesophageal reflux disease)   . Heart palpitations   . Hypertension   . HYPOTHYROIDISM   . Left wrist fracture 11/06/2016   10/2016 - fell of high stool  . Personal history of chemotherapy   . Personal history of radiation therapy   . Pleural plaque without asbestos    CT chest 10/2010: R>L lobes, upper>  lower  . Squamous cell carcinoma    History of BC    Past Surgical History:  Procedure Laterality Date  . BREAST LUMPECTOMY  2001   left  . CATARACT EXTRACTION Bilateral 09/2014   both eyes  . CHOLECYSTECTOMY  2010  . ENDOMETRIAL ABLATION    . EXCISION / BIOPSY BREAST / NIPPLE / DUCT Left 2001  . TUBAL LIGATION      Current Medications: Current Outpatient Medications on File Prior to Visit  Medication Sig  . atenolol (TENORMIN) 25 MG tablet Take 1 tablet by mouth daily.  . Calcium-Vitamin D-Vitamin K (CALCIUM + D + K) 750-500-40 MG-UNT-MCG TABS Take 1 tablet by mouth 2 (two) times daily.    . cholecalciferol (VITAMIN D) 25 MCG (1000 UNIT) tablet Take 1,000 Units by mouth daily.  Marland Kitchen ipratropium (ATROVENT) 0.03 % nasal spray Place 1 spray into both nostrils 4 (four) times daily.  Marland Kitchen levothyroxine (SYNTHROID) 75 MCG tablet TAKE 1 TABLET BY MOUTH EVERY DAY  . VALERIAN ROOT PLUS PO Take by mouth.   No current facility-administered medications on file prior to visit.     Allergies:   Allegra [fexofenadine], Levaquin [levofloxacin], Nexium [esomeprazole magnesium], and Prednisone    Family History: The patient's family history includes Dementia in her mother; Emphysema in her father; Heart disease in her father. There is no history of Colon cancer, Esophageal cancer, or  Rectal cancer.  ROS:   Please see the history of present illness.  Additional pertinent ROS otherwise unremarkable.  EKGs/Labs/Other Studies Reviewed:    The following studies were reviewed today: Echo 05/21/2018 - Left ventricle: The cavity size was normal. Systolic function was  normal. The estimated ejection fraction was in the range of 60%  to 65%. Wall motion was normal; there were no regional wall  motion abnormalities.  - Aortic valve: Transvalvular velocity was within the normal range.  There was no stenosis. There was mild regurgitation.  - Mitral valve: Transvalvular velocity was within the  normal range.  There was no evidence for stenosis. There was trivial  regurgitation.  - Right ventricle: The cavity size was normal. Wall thickness was  normal. Systolic function was normal.  - Atrial septum: No defect or patent foramen ovale was identified.  - Tricuspid valve: There was trivial regurgitation.  - Pulmonary arteries: PA peak pressure: 19 mm Hg (S).   Monitor 06/16/2018 14 day Zio patch. No VT, high degree AV block or pauses. Patient had a min HR of 44 bpm, max HR of 207 bpm, and avg HR of 72 bpm. Predominant underlying rhythm was Sinus Rhythm. First Degree AV Block was present.   72 Supraventricular Tachycardia runs occurred, the run with the fastest interval lasting 6 beats with a max rate of 207 bpm, the longest lasting 16 beats with an avg rate of 107 bpm. There were 23 triggered patient events, and supraventricular tachycardia was detected within +/- 45 seconds of symptomatic patient event(s). Isolated supraventricular and ventricular ectopy beats were rare (<1.0%).   EKG:  EKG is ordered today.  The ekg ordered today demonstrates sinus rhythm, first degree AV block, PVC/PAC, and low voltage.  Recent Labs: 09/21/2019: ALT 22; Hemoglobin 14.2; Platelets 213.0; TSH 4.32 09/27/2019: BUN 24; Creatinine, Ser 1.10; Potassium 4.3; Sodium 136  Recent Lipid Panel    Component Value Date/Time   CHOL 211 (H) 09/21/2019 0855   TRIG 124.0 09/21/2019 0855   TRIG 78 06/09/2006 0945   HDL 52.50 09/21/2019 0855   CHOLHDL 4 09/21/2019 0855   VLDL 24.8 09/21/2019 0855   LDLCALC 133 (H) 09/21/2019 0855   LDLDIRECT 134.0 11/06/2016 1159    Physical Exam:    VS:  BP 116/84   Pulse 64   Ht _0  (1.549 m)   Wt 121 lb 12.8 oz (55.2 kg)   SpO2 99%   BMI 23.01 kg/m     Wt Readings from Last 3 Encounters:  01/06/20 121 lb (54.9 kg)  12/30/19 121 lb 9.6 oz (55.2 kg)  11/17/19 123 lb (55.8 kg)    GEN: Well nourished, well developed in no acute distress HEENT: Normal, moist  mucous membranes NECK: No JVD CARDIAC: regular rhythm, normal S1 and S2, no rubs or gallops. 1/6 systolic murmur. VASCULAR: Radial and DP pulses 2+ bilaterally. No carotid bruits RESPIRATORY:  Clear to auscultation without rales, wheezing or rhonchi  ABDOMEN: Soft, non-tender, non-distended MUSCULOSKELETAL:  Ambulates independently SKIN: Warm and dry, no edema NEUROLOGIC:  Alert and oriented x 3. No focal neuro deficits noted. PSYCHIATRIC:  Normal affect   ASSESSMENT:    1. Palpitation   2. Irregular heart beat   3. Murmur, cardiac   4. Cardiac risk counseling   5. Counseling on health promotion and disease prevention    PLAN:    Palpitations, noted irregular heart beat:  -much improved on atenolol -sinus with occasional PAC/PVC on ECG -echo, monitor reviewed as  above -no high risk features such as syncope  Cardiac murmur: -no high risk findings on echo  CV risk factor counseling and prevention: -recommend heart healthy/Mediterranean diet, with whole grains, fruits, vegetable, fish, lean meats, nuts, and olive oil. Limit salt. -recommend moderate walking, 3-5 times/week for 30-50 minutes each session. Aim for at least 150 minutes.week. Goal should be pace of 3 miles/hours, or walking 1.5 miles in 30 minutes -recommend avoidance of tobacco products. Avoid excess alcohol. -ASCVD risk score: The 10-year ASCVD risk score Mikey Bussing DC Brooke Bonito., et al., 2013) is: 22.3%   Values used to calculate the score:     Age: 41 years     Sex: Female     Is Non-Hispanic African American: No     Diabetic: No     Tobacco smoker: No     Systolic Blood Pressure: 209 mmHg     Is BP treated: Yes     HDL Cholesterol: 55 mg/dL     Total Cholesterol: 219 mg/dL   Plan for follow up: 1 year or sooner PRN  Buford Dresser, MD, PhD [place]  [facility]   Medication Adjustments/Labs and Tests Ordered: Current medicines are reviewed at length with the patient today.  Concerns regarding  medicines are outlined above.  Orders Placed This Encounter  Procedures  . EKG 12-Lead   No orders of the defined types were placed in this encounter.   Patient Instructions  Medication Instructions:  Your Physician recommend you continue on your current medication as directed.    *If you need a refill on your cardiac medications before your next appointment, please call your pharmacy*   Lab Work: None   Testing/Procedures: None   Follow-Up: At [facility], you and your health needs are our priority.  As part of our continuing mission to provide you with exceptional heart care, we have created designated Provider Care Teams.  These Care Teams include your primary Cardiologist (physician) and Advanced Practice Providers (APPs -  Physician Assistants and Nurse Practitioners) who all work together to provide you with the care you need, when you need it.  We recommend signing up for the patient portal called "MyChart".  Sign up information is provided on this After Visit Summary.  MyChart is used to connect with patients for Virtual Visits (Telemedicine).  Patients are able to view lab/test results, encounter notes, upcoming appointments, etc.  Non-urgent messages can be sent to your provider as well.   To learn more about what you can do with MyChart, go to [email or web address].    Your next appointment:   1 year(s)  The format for your next appointment:   In Person  Provider:   Buford Dresser, MD       Signed, Buford Dresser, MD PhD 01/24/2020     [place]

## 2020-02-08 ENCOUNTER — Ambulatory Visit
Admission: RE | Admit: 2020-02-08 | Discharge: 2020-02-08 | Disposition: A | Discharge status: Home / Self Care | Payer: Medicare HMO | Source: Ambulatory Visit | Attending: Obstetrics and Gynecology | Admitting: Obstetrics and Gynecology

## 2020-02-08 ENCOUNTER — Other Ambulatory Visit: Payer: Self-pay

## 2020-02-08 DIAGNOSIS — Z1231 Encounter for screening mammogram for malignant neoplasm of breast: Secondary | ICD-10-CM

## 2020-02-10 DIAGNOSIS — L719 Rosacea, unspecified: Secondary | ICD-10-CM | POA: Diagnosis not present

## 2020-02-10 DIAGNOSIS — L821 Other seborrheic keratosis: Secondary | ICD-10-CM | POA: Diagnosis not present

## 2020-03-22 DIAGNOSIS — R7303 Prediabetes: Secondary | ICD-10-CM | POA: Insufficient documentation

## 2020-03-22 DIAGNOSIS — R739 Hyperglycemia, unspecified: Secondary | ICD-10-CM | POA: Insufficient documentation

## 2020-03-22 DIAGNOSIS — N183 Chronic kidney disease, stage 3 unspecified: Secondary | ICD-10-CM | POA: Insufficient documentation

## 2020-03-22 NOTE — Patient Instructions (Addendum)
  Blood work was ordered.   ° ° °Medications reviewed and updated.  Changes include :   none ° ° ° °Please followup in 6 months ° ° °

## 2020-03-22 NOTE — Progress Notes (Signed)
Subjective:    Patient ID: Emily [name], female    DOB: [birth year], 79 y.o.   MRN: [id number]  HPI The patient is here for follow up of their chronic medical problems, including hypothyroidism, palpitations/SVT, hyperglycemia, CKD  She is taking all of her medications as prescribed.   She is exercising regularly.  She walks on the treadmill.    She is not taking nsaids.  She is drinking more water - at least 30 oz.    She has some urgency with urination and leakage at times if she does not make it.      Medications and allergies reviewed with patient and updated if appropriate.  Patient Active Problem List   Diagnosis Date Noted  . CKD (chronic kidney disease) stage 3, GFR 30-59 ml/min 03/22/2020  . Hyperglycemia 03/22/2020  . Change in stool 11/17/2019  . Right-sided chest wall pain 11/17/2019  . Urinary retention 09/23/2019  . SOB (shortness of breath) 01/15/2019  . Upper airway cough syndrome 01/15/2019  . Belching 12/14/2018  . Reactive airway disease 12/05/2018  . Pre-syncope 05/19/2018  . DDD (degenerative disc disease), cervical 01/15/2018  . Neuralgia 09/12/2017  . Memory difficulties 11/06/2016  . IBS (irritable bowel syndrome) 04/16/2016  . Hyperlipidemia 09/10/2015  . Osteopenia 09/08/2015  . Palpitations   . Pleural plaque without asbestos 12/03/2010  . Cervicalgia 12/18/2009  . DIVERTICULOSIS, COLON 03/02/2009  . DYSPLASTIC NEVUS, [place] 12/06/2008  . Lumbago 03/11/2008  . Actinic keratosis 01/23/2008  . CARCINOMA, BREAST, ESTROGEN RECEPTOR NEGATIVE 10/12/2007  . Hypothyroidism 06/18/2007  . Allergic rhinitis 06/11/2007    Current Outpatient Medications on File Prior to Visit  Medication Sig Dispense Refill  . atenolol (TENORMIN) 25 MG tablet Take 1 tablet by mouth daily. 90 tablet 1  . Calcium-Vitamin D-Vitamin K (CALCIUM + D + K) 426-834-19 MG-UNT-MCG TABS Take 1 tablet by mouth 2 (two) times daily.      . cholecalciferol (VITAMIN D) 25 MCG  (1000 UNIT) tablet Take 1,000 Units by mouth daily.    Marland Kitchen ipratropium (ATROVENT) 0.03 % nasal spray Place 1 spray into both nostrils 4 (four) times daily. 90 mL 1  . levothyroxine (SYNTHROID) 75 MCG tablet TAKE 1 TABLET BY MOUTH EVERY DAY 90 tablet 1   No current facility-administered medications on file prior to visit.    Past Medical History:  Diagnosis Date  . Actinic keratosis   . Allergic rhinitis   . BACK PAIN   . 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 Cervicalgia   . Cholelithiasis   . DIVERTICULOSIS, COLON   . ENDOMETRIOSIS   . GERD (gastroesophageal reflux disease)   . Heart palpitations   . Hypertension   . HYPOTHYROIDISM   . Left wrist fracture 11/06/2016   10/2016 - fell of high stool  . Personal history of chemotherapy   . Personal history of radiation therapy   . Pleural plaque without asbestos    CT chest 10/2010: R>L lobes, upper> lower  . Squamous cell carcinoma    History of BC    Past Surgical History:  Procedure Laterality Date  . BREAST LUMPECTOMY  2001   left  . CATARACT EXTRACTION Bilateral 09/2014   both eyes  . CHOLECYSTECTOMY  2010  . ENDOMETRIAL ABLATION    . EXCISION / BIOPSY BREAST / NIPPLE / DUCT Left 2001  . TUBAL LIGATION      Social History   Socioeconomic History  .  Marital status: Married    Spouse name: Not on file  . Number of children: 2  . Years of education: Not on file  . Highest education level: Not on file  Occupational History  . Occupation: Retired    Comment: Chiropractor  Tobacco Use  . Smoking status: Never Smoker  . Smokeless tobacco: Never Used  Vaping Use  . Vaping Use: Never used  Substance and Sexual Activity  . Alcohol use: No  . Drug use: No  . Sexual activity: Yes  Other Topics Concern  . Not on file  Social History Narrative   Married, lives in [place] area since 2008 from [place] to be close to dtr   Social Determinants of Health   Financial  Resource Strain:   . Difficulty of Paying Living Expenses:   Food Insecurity:   . Worried About Charity fundraiser in the Last Year:   . Arboriculturist in the Last Year:   Transportation Needs:   . Film/video editor (Medical):   Marland Kitchen Lack of Transportation (Non-Medical):   Physical Activity:   . Days of Exercise per Week:   . Minutes of Exercise per Session:   Stress:   . Feeling of Stress :   Social Connections:   . Frequency of Communication with Friends and Family:   . Frequency of Social Gatherings with Friends and Family:   . Attends Religious Services:   . Active Member of Clubs or Organizations:   . Attends Archivist Meetings:   Marland Kitchen Marital Status:     Family History  Problem Relation Age of Onset  . Heart disease Father   . Emphysema Father        smoked and worked with asbestos  . Dementia Mother   . Colon cancer Neg Hx   . Esophageal cancer Neg Hx   . Rectal cancer Neg Hx     Review of Systems  Constitutional: Negative for chills, fatigue and fever.  Respiratory: Negative for cough, shortness of breath and wheezing.   Cardiovascular: Negative for chest pain, palpitations and leg swelling.  Genitourinary: Positive for urgency (Occasionally with incontinence). Negative for difficulty urinating and dysuria.  Neurological: Negative for light-headedness and headaches.       Objective:   Vitals:   03/23/20 0906  BP: 118/80  Pulse: 68  Temp: 98.2 F (36.8 C)  SpO2: 94%   BP Readings from Last 3 Encounters:  03/23/20 118/80  01/24/20 116/84  01/06/20 140/82   Wt Readings from Last 3 Encounters:  03/23/20 117 lb 3.2 oz (53.2 kg)  01/24/20 121 lb 12.8 oz (55.2 kg)  01/06/20 121 lb (54.9 kg)   Body mass index is 22.14 kg/m.   Physical Exam    Constitutional: Appears well-developed and well-nourished. No distress.  HENT:  Head: Normocephalic and atraumatic.  Neck: Neck supple. No tracheal deviation present. No thyromegaly present.  No  cervical lymphadenopathy Cardiovascular: Normal rate, regular rhythm and normal heart sounds.   No murmur heard. No carotid bruit .  No edema Pulmonary/Chest: Effort normal and breath sounds normal. No respiratory distress. No has no wheezes. No rales.  Skin: Skin is warm and dry. Not diaphoretic.  Psychiatric: Normal mood and affect. Behavior is normal.      Assessment & Plan:    See Problem List for Assessment and Plan of chronic medical problems.    This visit occurred during the SARS-CoV-2 public health emergency.  Safety protocols were in place,  including screening questions prior to the visit, additional usage of staff PPE, and extensive cleaning of exam room while observing appropriate contact time as indicated for disinfecting solutions.

## 2020-03-23 ENCOUNTER — Ambulatory Visit (INDEPENDENT_AMBULATORY_CARE_PROVIDER_SITE_OTHER): Payer: Medicare HMO | Admitting: Internal Medicine

## 2020-03-23 ENCOUNTER — Encounter: Payer: Self-pay | Admitting: Internal Medicine

## 2020-03-23 ENCOUNTER — Other Ambulatory Visit: Payer: Self-pay

## 2020-03-23 VITALS — BP 118/80 | HR 68 | Temp 98.2°F | Wt 117.2 lb

## 2020-03-23 DIAGNOSIS — R739 Hyperglycemia, unspecified: Secondary | ICD-10-CM | POA: Diagnosis not present

## 2020-03-23 DIAGNOSIS — N1831 Chronic kidney disease, stage 3a: Secondary | ICD-10-CM

## 2020-03-23 DIAGNOSIS — E038 Other specified hypothyroidism: Secondary | ICD-10-CM | POA: Diagnosis not present

## 2020-03-23 DIAGNOSIS — R3915 Urgency of urination: Secondary | ICD-10-CM

## 2020-03-23 DIAGNOSIS — E782 Mixed hyperlipidemia: Secondary | ICD-10-CM | POA: Diagnosis not present

## 2020-03-23 DIAGNOSIS — R002 Palpitations: Secondary | ICD-10-CM | POA: Diagnosis not present

## 2020-03-23 MED ORDER — AZELASTINE HCL 0.1 % NA SOLN: 2.0000 | Freq: Every day | NASAL | 12 refills | Status: DC | PRN

## 2020-03-23 NOTE — Assessment & Plan Note (Signed)
Acute Experiencing urinary urgency with occasional incontinence Discussed treatment options-she can wear a pad, go to the bathroom more often to prevent her bladder from being too full, do Kegel exercises, do official physical therapy for her pelvic muscles, consider medication and consider referral to gyn, uro-GYN or urology She is not drinking a lot of caffeine and drinking mostly water She will treat this conservatively for now and let me know if she wants to have a referral

## 2020-03-23 NOTE — Assessment & Plan Note (Signed)
Chronic Sugars have been slightly elevated in past Check A1c

## 2020-03-23 NOTE — Assessment & Plan Note (Signed)
Chronic Drinking more water Does not take any nsaids cmp today

## 2020-03-23 NOTE — Assessment & Plan Note (Signed)
Chronic Palpitations/ runs of SVT Controlled with current dose of Atenolol

## 2020-03-23 NOTE — Assessment & Plan Note (Signed)
Chronic  Clinically euthyroid Check tsh  Titrate med dose if needed  

## 2020-03-23 NOTE — Assessment & Plan Note (Signed)
Chronic Diet controlled Check lipid panel Continue regular exercise and healthy diet

## 2020-03-24 LAB — BASIC METABOLIC PANEL WITH GFR
BUN/Creatinine Ratio: 24 (calc) — ABNORMAL HIGH (ref 6–22)
BUN: 24 mg/dL (ref 7–25)
CO2: 29 mmol/L (ref 20–32)
Calcium: 10.3 mg/dL (ref 8.6–10.4)
Chloride: 104 mmol/L (ref 98–110)
Creat: 0.99 mg/dL — ABNORMAL HIGH (ref 0.60–0.93)
GFR, Est African American: 64 mL/min/{1.73_m2} (ref >=60)
GFR, Est Non African American: 55 mL/min/{1.73_m2} — ABNORMAL LOW (ref >=60)
Glucose, Bld: 81 mg/dL (ref 65–99)
Potassium: 4.6 mmol/L (ref 3.5–5.3)
Sodium: 141 mmol/L (ref 135–146)

## 2020-03-24 LAB — LIPID PANEL
Cholesterol: 219 mg/dL — ABNORMAL HIGH (ref <200)
HDL: 55 mg/dL (ref >=50)
LDL Cholesterol (Calc): 138 mg/dL (calc) — ABNORMAL HIGH
Non-HDL Cholesterol (Calc): 164 mg/dL (calc) — ABNORMAL HIGH (ref <130)
Total CHOL/HDL Ratio: 4 (calc) (ref <5.0)
Triglycerides: 131 mg/dL (ref <150)

## 2020-03-24 LAB — TSH: TSH: 0.24 mIU/L — ABNORMAL LOW (ref 0.40–4.50)

## 2020-03-24 LAB — HEMOGLOBIN A1C
Hgb A1c MFr Bld: 5.9 % of total Hgb — ABNORMAL HIGH (ref <5.7)
Mean Plasma Glucose: 123 (calc)
eAG (mmol/L): 6.8 (calc)

## 2020-03-25 ENCOUNTER — Other Ambulatory Visit: Payer: Self-pay | Admitting: Internal Medicine

## 2020-03-25 MED ORDER — LEVOTHYROXINE SODIUM 75 MCG PO TABS: 75.0000 ug | ORAL_TABLET | Freq: Every day | ORAL | 1 refills | Status: DC

## 2020-03-27 ENCOUNTER — Encounter: Payer: Self-pay | Admitting: Internal Medicine

## 2020-04-11 ENCOUNTER — Encounter: Payer: Self-pay | Admitting: Cardiology

## 2020-04-18 ENCOUNTER — Other Ambulatory Visit: Payer: Self-pay | Admitting: Internal Medicine

## 2020-05-11 ENCOUNTER — Encounter: Payer: Self-pay | Admitting: Internal Medicine

## 2020-05-11 ENCOUNTER — Other Ambulatory Visit: Payer: Self-pay | Admitting: Internal Medicine

## 2020-05-13 ENCOUNTER — Other Ambulatory Visit: Payer: Self-pay | Admitting: Internal Medicine

## 2020-05-18 DIAGNOSIS — R69 Illness, unspecified: Secondary | ICD-10-CM | POA: Diagnosis not present

## 2020-05-28 ENCOUNTER — Other Ambulatory Visit: Payer: Self-pay | Admitting: Internal Medicine

## 2020-06-22 DIAGNOSIS — R69 Illness, unspecified: Secondary | ICD-10-CM | POA: Diagnosis not present

## 2020-07-24 ENCOUNTER — Other Ambulatory Visit: Payer: Self-pay | Admitting: Internal Medicine

## 2020-07-31 NOTE — Progress Notes (Signed)
Subjective:    Patient ID: Emily [name], female    DOB: [birth year], 79 y.o.   MRN: [id number]  HPI The patient is here for an acute visit.   Cut on hand - cut her right posterior index finger two days ago with a knife.  It bled a lot.  She was washing the knife and her wound got cleaned out well.  She has minimal pain.  She has black and blue discoloration above and below the wound that is not painful.  The finger is not red, swollen and there is no discharge.  She denies fever, chill, n/t.    Steri strips put on the wound on Sunday - changed on Monday.  She has kept it covered.    Medications and allergies reviewed with patient and updated if appropriate.  Patient Active Problem List   Diagnosis Date Noted  . Urinary urgency 03/23/2020  . CKD (chronic kidney disease) stage 3, GFR 30-59 ml/min (HCC) 03/22/2020  . Hyperglycemia 03/22/2020  . Change in stool 11/17/2019  . Right-sided chest wall pain 11/17/2019  . Urinary retention 09/23/2019  . SOB (shortness of breath) 01/15/2019  . Upper airway cough syndrome 01/15/2019  . Belching 12/14/2018  . Reactive airway disease 12/05/2018  . Pre-syncope 05/19/2018  . DDD (degenerative disc disease), cervical 01/15/2018  . Neuralgia 09/12/2017  . Memory difficulties 11/06/2016  . IBS (irritable bowel syndrome) 04/16/2016  . Hyperlipidemia 09/10/2015  . Osteopenia 09/08/2015  . Palpitations   . Pleural plaque without asbestos 12/03/2010  . Cervicalgia 12/18/2009  . DIVERTICULOSIS, COLON 03/02/2009  . DYSPLASTIC NEVUS, [place] 12/06/2008  . Lumbago 03/11/2008  . Actinic keratosis 01/23/2008  . CARCINOMA, BREAST, ESTROGEN RECEPTOR NEGATIVE 10/12/2007  . Hypothyroidism 06/18/2007  . Allergic rhinitis 06/11/2007    Current Outpatient Medications on File Prior to Visit  Medication Sig Dispense Refill  . atenolol (TENORMIN) 25 MG tablet TAKE 1 TABLET BY MOUTH EVERY DAY 90 tablet 1  . azelastine (ASTELIN) 0.1 % nasal spray  Place 2 sprays into both nostrils daily as needed for rhinitis. Use in each nostril as directed 30 mL 12  . Calcium-Vitamin D-Vitamin K 750-500-40 MG-UNT-MCG TABS Take 1 tablet by mouth 2 (two) times daily.    . cholecalciferol (VITAMIN D) 25 MCG (1000 UNIT) tablet Take 1,000 Units by mouth daily.    Marland Kitchen ipratropium (ATROVENT) 0.03 % nasal spray INSTILL 2 SPRAYS IN EACH NOSTRIL EVERY 12 HOURS 30 mL 1   No current facility-administered medications on file prior to visit.    Past Medical History:  Diagnosis Date  . Actinic keratosis   . Allergic rhinitis   . BACK PAIN   . 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 Cervicalgia   . Cholelithiasis   . DIVERTICULOSIS, COLON   . ENDOMETRIOSIS   . GERD (gastroesophageal reflux disease)   . Heart palpitations   . Hypertension   . HYPOTHYROIDISM   . Left wrist fracture 11/06/2016   10/2016 - fell of high stool  . Personal history of chemotherapy   . Personal history of radiation therapy   . Pleural plaque without asbestos    CT chest 10/2010: R>L lobes, upper> lower  . Squamous cell carcinoma    History of BC    Past Surgical History:  Procedure Laterality Date  . BREAST LUMPECTOMY  2001   left  . CATARACT EXTRACTION Bilateral 09/2014   both eyes  . CHOLECYSTECTOMY  2010  . ENDOMETRIAL ABLATION    . EXCISION / BIOPSY BREAST / NIPPLE / DUCT Left 2001  . TUBAL LIGATION      Social History   Socioeconomic History  . Marital status: Married    Spouse name: Not on file  . Number of children: 2  . Years of education: Not on file  . Highest education level: Not on file  Occupational History  . Occupation: Retired    Comment: Chiropractor  Tobacco Use  . Smoking status: Never Smoker  . Smokeless tobacco: Never Used  Vaping Use  . Vaping Use: Never used  Substance and Sexual Activity  . Alcohol use: No  . Drug use: No  . Sexual activity: Yes  Other Topics Concern  . Not on file   Social History Narrative   Married, lives in [place] area since 2008 from [place] to be close to dtr   Social Determinants of Radio broadcast assistant Strain: Not on file  Food Insecurity: Not on file  Transportation Needs: Not on file  Physical Activity: Not on file  Stress: Not on file  Social Connections: Not on file    Family History  Problem Relation Age of Onset  . Heart disease Father   . Emphysema Father        smoked and worked with asbestos  . Dementia Mother   . Colon cancer Neg Hx   . Esophageal cancer Neg Hx   . Rectal cancer Neg Hx     Review of Systems Per HPI    Objective:   Vitals:   08/01/20 0832  BP: 124/82  Pulse: 68  Temp: 98 F (36.7 C)  SpO2: 97%   BP Readings from Last 3 Encounters:  08/01/20 124/82  03/23/20 118/80  01/24/20 116/84   Wt Readings from Last 3 Encounters:  08/01/20 122 lb 6.4 oz (55.5 kg)  03/23/20 117 lb 3.2 oz (53.2 kg)  01/24/20 121 lb 12.8 oz (55.2 kg)   Body mass index is 23.13 kg/m.   Physical Exam Constitutional:      General: She is not in acute distress.    Appearance: Normal appearance.  Skin:    General: Skin is warm and dry.     Comments: Cut right proximal posterior index finger with steri strips on it.  No swelling or redness.  Wound is healing well - closed w/o discharge. Minimal tenderness.  Normal sensation.  Did not affect PIP joint - FROM of finger  Neurological:     Mental Status: She is alert.            Assessment & Plan:    See Problem List for Assessment and Plan of chronic medical problems.    This visit occurred during the SARS-CoV-2 public health emergency.  Safety protocols were in place, including screening questions prior to the visit, additional usage of staff PPE, and extensive cleaning of exam room while observing appropriate contact time as indicated for disinfecting solutions.

## 2020-08-01 ENCOUNTER — Encounter: Payer: Self-pay | Admitting: Internal Medicine

## 2020-08-01 ENCOUNTER — Ambulatory Visit (INDEPENDENT_AMBULATORY_CARE_PROVIDER_SITE_OTHER): Payer: Medicare HMO | Admitting: Internal Medicine

## 2020-08-01 ENCOUNTER — Other Ambulatory Visit: Payer: Self-pay

## 2020-08-01 VITALS — BP 124/82 | HR 68 | Temp 98.0°F | Ht 61.0 in | Wt 122.4 lb

## 2020-08-01 DIAGNOSIS — S61219A Laceration without foreign body of unspecified finger without damage to nail, initial encounter: Secondary | ICD-10-CM | POA: Insufficient documentation

## 2020-08-01 DIAGNOSIS — S61210A Laceration without foreign body of right index finger without damage to nail, initial encounter: Secondary | ICD-10-CM | POA: Diagnosis not present

## 2020-08-01 DIAGNOSIS — E038 Other specified hypothyroidism: Secondary | ICD-10-CM

## 2020-08-01 LAB — TSH: TSH: 3.99 u[IU]/mL (ref 0.35–4.50)

## 2020-08-01 MED ORDER — LEVOTHYROXINE SODIUM 75 MCG PO TABS
75.0000 ug | ORAL_TABLET | Freq: Every day | ORAL | 1 refills | Status: DC
Start: 2020-08-01 — End: 2021-01-24

## 2020-08-01 NOTE — Patient Instructions (Addendum)
°  Blood work was ordered.     Keep the steri strips on your finger.  Your finger looks good - just monitor for redness, increased swelling and pain.     Call with questions.

## 2020-08-01 NOTE — Assessment & Plan Note (Signed)
Acute R posterior index finger w/o infection Healing well - no redness, swelling and minimal pain tdap up to date No need to an antibiotic Discussed wound care - continue steri strips

## 2020-08-01 NOTE — Assessment & Plan Note (Signed)
Chronic  feels jittery at night Currently taking levothyroxine 75 mcg 6 days a week - changed dose in [place] Check tsh  Titrate med dose if needed

## 2020-08-02 ENCOUNTER — Encounter: Payer: Self-pay | Admitting: Internal Medicine

## 2020-08-03 ENCOUNTER — Other Ambulatory Visit: Payer: Medicare HMO

## 2020-08-03 DIAGNOSIS — Z20822 Contact with and (suspected) exposure to covid-19: Secondary | ICD-10-CM | POA: Diagnosis not present

## 2020-08-04 LAB — SARS-COV-2, NAA 2 DAY TAT

## 2020-08-04 LAB — NOVEL CORONAVIRUS, NAA: SARS-CoV-2, NAA: NOT DETECTED

## 2020-09-08 ENCOUNTER — Encounter: Payer: Self-pay | Admitting: Internal Medicine

## 2020-09-08 DIAGNOSIS — R339 Retention of urine, unspecified: Secondary | ICD-10-CM

## 2020-09-08 DIAGNOSIS — R3915 Urgency of urination: Secondary | ICD-10-CM

## 2020-09-22 DIAGNOSIS — R69 Illness, unspecified: Secondary | ICD-10-CM | POA: Diagnosis not present

## 2020-09-22 DIAGNOSIS — I700 Atherosclerosis of aorta: Secondary | ICD-10-CM | POA: Insufficient documentation

## 2020-09-22 NOTE — Progress Notes (Signed)
Subjective:    Patient ID: Emily [name], female    DOB: September 14, 1940, 80 y.o.   MRN: [id number]   This visit occurred during the SARS-CoV-2 public health emergency.  Safety protocols were in place, including screening questions prior to the visit, additional usage of staff PPE, and extensive cleaning of exam room while observing appropriate contact time as indicated for disinfecting solutions.    HPI She is here for a physical exam.   She is losing her words - she is having a conservation and can not think of the word.  several hours or even days later the word will pop into her head.  That occurs daily.  she does all the finances.   At the base of her spine - she was walking on the treadmill last week and she was on for 30 min and she had a pinching, burning pain in her sacrum region.  She had to take aleve, which she never takes.  She took it for 3 days Q12 hrs.  That helped.  Standing was the only comfortable position.  She had difficulty sleeping.    Medications and allergies reviewed with patient and updated if appropriate.  Patient Active Problem List   Diagnosis Date Noted  . Aortic atherosclerosis ([place]) 09/22/2020  . Urinary urgency 03/23/2020  . CKD (chronic kidney disease) stage 3, GFR 30-59 ml/min (HCC) 03/22/2020  . Hyperglycemia 03/22/2020  . Change in stool 11/17/2019  . Right-sided chest wall pain 11/17/2019  . Urinary retention 09/23/2019  . SOB (shortness of breath) 01/15/2019  . Upper airway cough syndrome 01/15/2019  . Belching 12/14/2018  . Reactive airway disease 12/05/2018  . Pre-syncope 05/19/2018  . DDD (degenerative disc disease), cervical 01/15/2018  . Neuralgia 09/12/2017  . Memory difficulties 11/06/2016  . IBS (irritable bowel syndrome) 04/16/2016  . Hyperlipidemia 09/10/2015  . Osteopenia 09/08/2015  . Palpitations   . Pleural plaque without asbestos 12/03/2010  . Cervicalgia 12/18/2009  . DIVERTICULOSIS, COLON 03/02/2009  . Lumbago  03/11/2008  . Actinic keratosis 01/23/2008  . CARCINOMA, BREAST, ESTROGEN RECEPTOR NEGATIVE 10/12/2007  . Hypothyroidism 06/18/2007  . Allergic rhinitis 06/11/2007    Current Outpatient Medications on File Prior to Visit  Medication Sig Dispense Refill  . atenolol (TENORMIN) 25 MG tablet TAKE 1 TABLET BY MOUTH EVERY DAY 90 tablet 1  . azelastine (ASTELIN) 0.1 % nasal spray Place 2 sprays into both nostrils daily as needed for rhinitis. Use in each nostril as directed 30 mL 12  . Calcium-Vitamin D-Vitamin K 750-500-40 MG-UNT-MCG TABS Take 1 tablet by mouth 2 (two) times daily.    . cholecalciferol (VITAMIN D) 25 MCG (1000 UNIT) tablet Take 1,000 Units by mouth daily.    Marland Kitchen ipratropium (ATROVENT) 0.03 % nasal spray INSTILL 2 SPRAYS IN EACH NOSTRIL EVERY 12 HOURS 30 mL 1  . levothyroxine (SYNTHROID) 75 MCG tablet Take 1 tablet (75 mcg total) by mouth daily. Take 6 days a week only 90 tablet 1   No current facility-administered medications on file prior to visit.    Past Medical History:  Diagnosis Date  . Actinic keratosis   . Allergic rhinitis   . BACK PAIN   . 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 Cervicalgia   . Cholelithiasis   . DIVERTICULOSIS, COLON   . ENDOMETRIOSIS   . GERD (gastroesophageal reflux disease)   . Heart palpitations   . Hypertension   . HYPOTHYROIDISM   .  Left wrist fracture 11/06/2016   10/2016 - fell of high stool  . Personal history of chemotherapy   . Personal history of radiation therapy   . Pleural plaque without asbestos    CT chest 10/2010: R>L lobes, upper> lower  . Squamous cell carcinoma    History of BC    Past Surgical History:  Procedure Laterality Date  . BREAST LUMPECTOMY  2001   left  . CATARACT EXTRACTION Bilateral 09/2014   both eyes  . CHOLECYSTECTOMY  2010  . ENDOMETRIAL ABLATION    . EXCISION / BIOPSY BREAST / NIPPLE / DUCT Left 2001  . TUBAL LIGATION      Social History    Socioeconomic History  . Marital status: Married    Spouse name: Not on file  . Number of children: 2  . Years of education: Not on file  . Highest education level: Not on file  Occupational History  . Occupation: Retired    Comment: Chiropractor  Tobacco Use  . Smoking status: Never Smoker  . Smokeless tobacco: Never Used  Vaping Use  . Vaping Use: Never used  Substance and Sexual Activity  . Alcohol use: No  . Drug use: No  . Sexual activity: Yes  Other Topics Concern  . Not on file  Social History Narrative   Married, lives in [place] area since 2008 from [place] to be close to dtr   Social Determinants of Radio broadcast assistant Strain: Not on file  Food Insecurity: Not on file  Transportation Needs: Not on file  Physical Activity: Not on file  Stress: Not on file  Social Connections: Not on file    Family History  Problem Relation Age of Onset  . Heart disease Father   . Emphysema Father        smoked and worked with asbestos  . Dementia Mother   . Colon cancer Neg Hx   . Esophageal cancer Neg Hx   . Rectal cancer Neg Hx     Review of Systems  Constitutional: Negative for chills and fever.  HENT: Positive for congestion (at night) and postnasal drip.   Eyes: Negative for visual disturbance.  Respiratory: Negative for cough, shortness of breath and wheezing.   Cardiovascular: Negative for chest pain, palpitations and leg swelling.  Gastrointestinal: Negative for abdominal pain, constipation, diarrhea and nausea.       No gerd  Genitourinary: Negative for dysuria and hematuria.  Musculoskeletal: Positive for back pain (sacral region).  Skin: Negative for rash.       dryness  Neurological: Negative for dizziness, light-headedness and headaches.  Psychiatric/Behavioral: Negative for dysphoric mood. The patient is not nervous/anxious.        Objective:   Vitals:   09/25/20 0907  BP: 118/72  Pulse: 84  Temp: 98 F (36.7 C)  SpO2: 98%    Filed Weights   09/25/20 0907  Weight: 122 lb (55.3 kg)   Body mass index is 23.05 kg/m.  BP Readings from Last 3 Encounters:  09/25/20 118/72  08/01/20 124/82  03/23/20 118/80    Wt Readings from Last 3 Encounters:  09/25/20 122 lb (55.3 kg)  08/01/20 122 lb 6.4 oz (55.5 kg)  03/23/20 117 lb 3.2 oz (53.2 kg)    Depression screen [facility] 2/9 09/25/2020 09/23/2019 09/16/2018 09/12/2017 04/20/2015  Decreased Interest 0 0 0 0 0  Down, Depressed, Hopeless 0 0 0 0 0  PHQ - 2 Score 0 0 0 0 0  Altered sleeping 2 - - - -  Tired, decreased energy 0 - - - -  Change in appetite 0 - - - -  Feeling bad or failure about yourself  0 - - - -  Trouble concentrating 0 - - - -  Moving slowly or fidgety/restless 0 - - - -  Suicidal thoughts 0 - - - -  PHQ-9 Score 2 - - - -  Difficult doing work/chores Not difficult at all - - - -      Physical Exam Constitutional: She appears well-developed and well-nourished. No distress.  HENT:  Head: Normocephalic and atraumatic.  Right Ear: External ear normal. Normal ear canal and TM Left Ear: External ear normal.  Normal ear canal and TM Mouth/Throat: Oropharynx is clear and moist.  Eyes: Conjunctivae and EOM are normal.  Neck: Neck supple. No tracheal deviation present. No thyromegaly present.  No carotid bruit  Cardiovascular: Normal rate, regular rhythm and normal heart sounds.   No murmur heard.  No edema. Pulmonary/Chest: Effort normal and breath sounds normal. No respiratory distress. She has no wheezes. She has no rales.  Breast: deferred   Abdominal: Soft. She exhibits no distension. There is no tenderness.  Lymphadenopathy: She has no cervical adenopathy.  Skin: Skin is warm and dry. She is not diaphoretic.  Psychiatric: She has a normal mood and affect. Her behavior is normal.        Assessment & Plan:   Physical exam: Screening blood work    ordered Immunizations   Had covid booster Colonoscopy  N/a due to age 72  Up to date   Gyn   Dr Garwin Brothers, has appt with uro-gyn Dexa  Up to date  Eye exams  Not gone Exercise  Walking on treadmill Weight   normal Substance abuse   none   Screened for depression using the PHQ 9 scale.  No evidence of depression.       See Problem List for Assessment and Plan of chronic medical problems.

## 2020-09-23 NOTE — Patient Instructions (Addendum)
Blood work was ordered.     Medications changes include :   gabapentin for back pain - take at night if needed  Your prescription(s) have been submitted to your pharmacy. Please take as directed and contact our office if you believe you are having problem(s) with the medication(s).     Please followup in 1 year    Health Maintenance, Female Adopting a healthy lifestyle and getting preventive care are important in promoting health and wellness. Ask your health care provider about:  The right schedule for you to have regular tests and exams.  Things you can do on your own to prevent diseases and keep yourself healthy. What should I know about diet, weight, and exercise? Eat a healthy diet  Eat a diet that includes plenty of vegetables, fruits, low-fat dairy products, and lean protein.  Do not eat a lot of foods that are high in solid fats, added sugars, or sodium.   Maintain a healthy weight Body mass index (BMI) is used to identify weight problems. It estimates body fat based on height and weight. Your health care provider can help determine your BMI and help you achieve or maintain a healthy weight. Get regular exercise Get regular exercise. This is one of the most important things you can do for your health. Most adults should:  Exercise for at least 150 minutes each week. The exercise should increase your heart rate and make you sweat (moderate-intensity exercise).  Do strengthening exercises at least twice a week. This is in addition to the moderate-intensity exercise.  Spend less time sitting. Even light physical activity can be beneficial. Watch cholesterol and blood lipids Have your blood tested for lipids and cholesterol at 78 years of age, then have this test every 5 years. Have your cholesterol levels checked more often if:  Your lipid or cholesterol levels are high.  You are older than 79 years of age.  You are at high risk for heart disease. What should I  know about cancer screening? Depending on your health history and family history, you may need to have cancer screening at various ages. This may include screening for:  Breast cancer.  Cervical cancer.  Colorectal cancer.  Skin cancer.  Lung cancer. What should I know about heart disease, diabetes, and high blood pressure? Blood pressure and heart disease  High blood pressure causes heart disease and increases the risk of stroke. This is more likely to develop in people who have high blood pressure readings, are of African descent, or are overweight.  Have your blood pressure checked: ? Every 3-5 years if you are 75-75 years of age. ? Every year if you are 47 years old or older. Diabetes Have regular diabetes screenings. This checks your fasting blood sugar level. Have the screening done:  Once every three years after age 7 if you are at a normal weight and have a low risk for diabetes.  More often and at a younger age if you are overweight or have a high risk for diabetes. What should I know about preventing infection? Hepatitis B If you have a higher risk for hepatitis B, you should be screened for this virus. Talk with your health care provider to find out if you are at risk for hepatitis B infection. Hepatitis C Testing is recommended for:  Everyone born from [birth year] through 1964.  Anyone with known risk factors for hepatitis C. Sexually transmitted infections (STIs)  Get screened for STIs, including gonorrhea and chlamydia, if: ? You  are sexually active and are younger than 79 years of age. ? You are older than 80 years of age and your health care provider tells you that you are at risk for this type of infection. ? Your sexual activity has changed since you were last screened, and you are at increased risk for chlamydia or gonorrhea. Ask your health care provider if you are at risk.  Ask your health care provider about whether you are at high risk for HIV. Your health  care provider may recommend a prescription medicine to help prevent HIV infection. If you choose to take medicine to prevent HIV, you should first get tested for HIV. You should then be tested every 3 months for as long as you are taking the medicine. Pregnancy  If you are about to stop having your period (premenopausal) and you may become pregnant, seek counseling before you get pregnant.  Take 400 to 800 micrograms (mcg) of folic acid every day if you become pregnant.  Ask for birth control (contraception) if you want to prevent pregnancy. Osteoporosis and menopause Osteoporosis is a disease in which the bones lose minerals and strength with aging. This can result in bone fractures. If you are 63 years old or older, or if you are at risk for osteoporosis and fractures, ask your health care provider if you should:  Be screened for bone loss.  Take a calcium or vitamin D supplement to lower your risk of fractures.  Be given hormone replacement therapy (HRT) to treat symptoms of menopause. Follow these instructions at home: Lifestyle  Do not use any products that contain nicotine or tobacco, such as cigarettes, e-cigarettes, and chewing tobacco. If you need help quitting, ask your health care provider.  Do not use street drugs.  Do not share needles.  Ask your health care provider for help if you need support or information about quitting drugs. Alcohol use  Do not drink alcohol if: ? Your health care provider tells you not to drink. ? You are pregnant, may be pregnant, or are planning to become pregnant.  If you drink alcohol: ? Limit how much you use to 0-1 drink a day. ? Limit intake if you are breastfeeding.  Be aware of how much alcohol is in your drink. In the [place] one drink equals one 12 oz bottle of beer (355 mL), one 5 oz glass of wine (148 mL), or one 1 oz glass of hard liquor (44 mL). General instructions  Schedule regular health, dental, and eye exams.  Stay  current with your vaccines.  Tell your health care provider if: ? You often feel depressed. ? You have ever been abused or do not feel safe at home. Summary  Adopting a healthy lifestyle and getting preventive care are important in promoting health and wellness.  Follow your health care provider's instructions about healthy diet, exercising, and getting tested or screened for diseases.  Follow your health care provider's instructions on monitoring your cholesterol and blood pressure. This information is not intended to replace advice given to you by your health care provider. Make sure you discuss any questions you have with your health care provider. Document Revised: 07/29/2018 Document Reviewed: 07/29/2018 Elsevier Patient Education  2021 [employer].

## 2020-09-25 ENCOUNTER — Ambulatory Visit (INDEPENDENT_AMBULATORY_CARE_PROVIDER_SITE_OTHER): Payer: Medicare HMO | Admitting: Internal Medicine

## 2020-09-25 ENCOUNTER — Ambulatory Visit: Payer: Medicare HMO

## 2020-09-25 ENCOUNTER — Encounter: Payer: Self-pay | Admitting: Internal Medicine

## 2020-09-25 ENCOUNTER — Other Ambulatory Visit: Payer: Self-pay

## 2020-09-25 ENCOUNTER — Telehealth: Payer: Self-pay | Admitting: Internal Medicine

## 2020-09-25 VITALS — BP 118/72 | HR 84 | Temp 98.0°F | Ht 61.0 in | Wt 122.0 lb

## 2020-09-25 DIAGNOSIS — I700 Atherosclerosis of aorta: Secondary | ICD-10-CM

## 2020-09-25 DIAGNOSIS — E038 Other specified hypothyroidism: Secondary | ICD-10-CM

## 2020-09-25 DIAGNOSIS — R002 Palpitations: Secondary | ICD-10-CM

## 2020-09-25 DIAGNOSIS — R413 Other amnesia: Secondary | ICD-10-CM

## 2020-09-25 DIAGNOSIS — M85869 Other specified disorders of bone density and structure, unspecified lower leg: Secondary | ICD-10-CM | POA: Diagnosis not present

## 2020-09-25 DIAGNOSIS — E782 Mixed hyperlipidemia: Secondary | ICD-10-CM | POA: Diagnosis not present

## 2020-09-25 DIAGNOSIS — Z1159 Encounter for screening for other viral diseases: Secondary | ICD-10-CM

## 2020-09-25 DIAGNOSIS — Z0000 Encounter for general adult medical examination without abnormal findings: Secondary | ICD-10-CM | POA: Diagnosis not present

## 2020-09-25 DIAGNOSIS — G8929 Other chronic pain: Secondary | ICD-10-CM

## 2020-09-25 DIAGNOSIS — R739 Hyperglycemia, unspecified: Secondary | ICD-10-CM

## 2020-09-25 DIAGNOSIS — M545 Low back pain, unspecified: Secondary | ICD-10-CM

## 2020-09-25 DIAGNOSIS — N1831 Chronic kidney disease, stage 3a: Secondary | ICD-10-CM

## 2020-09-25 LAB — COMPREHENSIVE METABOLIC PANEL
ALT: 15 U/L (ref 0–35)
AST: 21 U/L (ref 0–37)
Albumin: 4.4 g/dL (ref 3.5–5.2)
Alkaline Phosphatase: 70 U/L (ref 39–117)
BUN: 26 mg/dL — ABNORMAL HIGH (ref 6–23)
CO2: 29 mEq/L (ref 19–32)
Calcium: 10.4 mg/dL (ref 8.4–10.5)
Chloride: 103 mEq/L (ref 96–112)
Creatinine, Ser: 1.06 mg/dL (ref 0.40–1.20)
GFR: 50.4 mL/min — ABNORMAL LOW (ref >60.00)
Glucose, Bld: 92 mg/dL (ref 70–99)
Potassium: 4.4 mEq/L (ref 3.5–5.1)
Sodium: 139 mEq/L (ref 135–145)
Total Bilirubin: 0.5 mg/dL (ref 0.2–1.2)
Total Protein: 7.6 g/dL (ref 6.0–8.3)

## 2020-09-25 LAB — LIPID PANEL
Cholesterol: 204 mg/dL — ABNORMAL HIGH (ref 0–200)
HDL: 56 mg/dL (ref >39.00)
LDL Cholesterol: 128 mg/dL — ABNORMAL HIGH (ref 0–99)
NonHDL: 147.98
Total CHOL/HDL Ratio: 4
Triglycerides: 98 mg/dL (ref 0.0–149.0)
VLDL: 19.6 mg/dL (ref 0.0–40.0)

## 2020-09-25 LAB — CBC WITH DIFFERENTIAL/PLATELET
Basophils Absolute: 0.1 10*3/uL (ref 0.0–0.1)
Basophils Relative: 0.8 % (ref 0.0–3.0)
Eosinophils Absolute: 0.3 10*3/uL (ref 0.0–0.7)
Eosinophils Relative: 3.8 % (ref 0.0–5.0)
HCT: 43.8 % (ref 36.0–46.0)
Hemoglobin: 14.7 g/dL (ref 12.0–15.0)
Lymphocytes Relative: 30.6 % (ref 12.0–46.0)
Lymphs Abs: 2.2 10*3/uL (ref 0.7–4.0)
MCHC: 33.7 g/dL (ref 30.0–36.0)
MCV: 88.8 fl (ref 78.0–100.0)
Monocytes Absolute: 1 10*3/uL (ref 0.1–1.0)
Monocytes Relative: 14 % — ABNORMAL HIGH (ref 3.0–12.0)
Neutro Abs: 3.7 10*3/uL (ref 1.4–7.7)
Neutrophils Relative %: 50.8 % (ref 43.0–77.0)
Platelets: 174 10*3/uL (ref 150.0–400.0)
RBC: 4.92 Mil/uL (ref 3.87–5.11)
RDW: 13.5 % (ref 11.5–15.5)
WBC: 7.3 10*3/uL (ref 4.0–10.5)

## 2020-09-25 LAB — TSH: TSH: 2.64 u[IU]/mL (ref 0.35–4.50)

## 2020-09-25 LAB — HEMOGLOBIN A1C: Hgb A1c MFr Bld: 6 % (ref 4.6–6.5)

## 2020-09-25 LAB — VITAMIN B12: Vitamin B-12: 580 pg/mL (ref 211–911)

## 2020-09-25 MED ORDER — GABAPENTIN 100 MG PO CAPS: 100.0000 mg | ORAL_CAPSULE | Freq: Every day | ORAL | 0 refills | Status: DC

## 2020-09-25 NOTE — Assessment & Plan Note (Signed)
Chronic  Clinically euthyroid Currently taking levothyroxine 75 mcg 6 days a week Check tsh  Titrate med dose if needed

## 2020-09-25 NOTE — Telephone Encounter (Signed)
Called pt to r/s appt with [facility] for today 10/15/20. Pt stated she will R/S later.

## 2020-09-25 NOTE — Assessment & Plan Note (Addendum)
Acute Has known degenerative disc disease and bulging disc in the past and has seen [facility] Having more sacral type pain which she describes as a pinching or burning sensation after walking on the treadmill for 30 minutes Aleve every 12 hours x3 days did improve the symptoms, but she can still feel it slightly She does try to avoid NSAIDs-wonders what else she can try We will prescribe gabapentin 100 mg for her to try at bedtime if she has more severe pain again Discussed side effects of gabapentin, specifically drowsiness or dizziness-we can titrate this up if tolerated May need to limit health long she walks If no improvement she will go back to see orthopedics

## 2020-09-25 NOTE — Assessment & Plan Note (Addendum)
Chronic CMP rarely takes NSAIDs

## 2020-09-25 NOTE — Assessment & Plan Note (Signed)
Chronic DEXA up-to-date She is currently walking for exercise-we will try to continue despite the back pain, but may not be able to walk as far Continue calcium and vitamin D daily

## 2020-09-25 NOTE — Assessment & Plan Note (Signed)
Chronic Check lipid panel She is exercising regularly Continue healthy diet Not currently on a statin-we will see what lipid panel looks like and consider recommending low-dose statin

## 2020-09-25 NOTE — Assessment & Plan Note (Addendum)
Chronic She feels this has gotten worse Was tested by neuropsychology in 10/2017 and at that time her general anxiety was more of a concern and thought to be the problem with some of her memory concerns-reviewed that note She complains of word finding difficulty primarily.  Sometimes she does forget conversations She does have some mild anxiety-GAD-7 or is 4.  She does not feel like she needs anything for anxiety She handles of the finances and is able to drive without difficulty not getting lost She deferred further neuropsychology testing at this time She will continue to monitor and let me know if she wants to have retesting done at some time We will check TSH and adjust medication if needed, check vitamin B12 level

## 2020-09-25 NOTE — Assessment & Plan Note (Signed)
Chronic Controlled with atenolol 25 mg daily-continue

## 2020-09-25 NOTE — Assessment & Plan Note (Signed)
Chronic A1c Low sugar/carbohydrate diet and regular exercise

## 2020-09-25 NOTE — Assessment & Plan Note (Signed)
Chronic Does have some aortic atherosclerosis-likely mild Check lipid panel  Diet controlled Regular exercise and healthy diet encouraged

## 2020-09-25 NOTE — Progress Notes (Signed)
[facility] New Patient Evaluation and Consultation  Referring Provider: Binnie Rail, MD PCP: Binnie Rail, MD Date of Service: 09/29/2020  SUBJECTIVE Chief Complaint: New Patient (Initial Visit)  History of Present Illness: Emily [name] is a 80 y.o. White or Caucasian female seen in consultation at the request of Dr. Quay Burow for evaluation of urinary urgency.    Review of records from Dr Quay Burow significant for: Patient reports urgency with urination. Has leakage at times if she does not make it.   Urinary Symptoms: Leaks urine with with movement to the bathroom and with urgency Leaks 1 time(s) per day.  Pad use: 1 liners/ mini-pads per day.   She is not bothered by her UI symptoms. Drinks: herbal teas, 1 cup green tea, 24-32oz water  Day time voids 5-6.  Nocturia: 2-3 times per night to void. Voiding dysfunction: she does not empty her bladder well.  does not use a catheter to empty bladder.  When urinating, she feels a weak stream and dribbling after finishing  UTIs: 0 UTI's in the last year.   Denies history of blood in urine and kidney or bladder stones  Pelvic Organ Prolapse Symptoms:                  She Denies a feeling of a bulge the vaginal area.   Bowel Symptom: Bowel movements: 1-2 time(s) per day Stool consistency: soft  Straining: no.  Splinting: no.  Incomplete evacuation: no.  She denies accidental bowel leakage / fecal incontinence Bowel regimen: none Last colonoscopy: Date 2010, Results negative  Sexual Function Sexually active: no.  Pain with sex: at the vaginal opening, has discomfort due to dryness  Pelvic Pain Denies pelvic pain  Has been using monistat x3 days but this caused burning. Discharge was light yellow in color with an odor. Then did douching with water/ vinegar but this also caused burning.   Does not use any lubricant on a regular basis.    Past Medical History:  Past Medical History:  Diagnosis Date  .  Actinic keratosis   . Allergic rhinitis   . BACK PAIN   . 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 Cervicalgia   . Cholelithiasis   . DIVERTICULOSIS, COLON   . ENDOMETRIOSIS   . GERD (gastroesophageal reflux disease)   . Heart palpitations   . Hypertension   . HYPOTHYROIDISM   . Left wrist fracture 11/06/2016   10/2016 - fell of high stool  . Personal history of chemotherapy   . Personal history of radiation therapy   . Pleural plaque without asbestos    CT chest 10/2010: R>L lobes, upper> lower  . Squamous cell carcinoma    History of BC     Past Surgical History:   Past Surgical History:  Procedure Laterality Date  . BREAST LUMPECTOMY  2001   left  . CATARACT EXTRACTION Bilateral 09/2014   both eyes  . CHOLECYSTECTOMY  2010  . ENDOMETRIAL ABLATION    . EXCISION / BIOPSY BREAST / NIPPLE / DUCT Left 2001     Past OB/GYN History: OB History  Gravida Para Term Preterm AB Living  2 2 2     2   SAB IAB Ectopic Multiple Live Births               # Outcome Date GA Lbr Len/2nd Weight Sex Delivery Anes PTL Lv  2 Term  1 Term            Menopausal: Yes, at age 80, Denies vaginal bleeding since menopause Contraception: n/a. Last pap smear was 2021-negative.  Any history of abnormal pap smears: no.   Medications: She has a current medication list which includes the following prescription(s): atenolol, azelastine, calcium-vitamin d-vitamin k, cholecalciferol, gabapentin, ipratropium, and levothyroxine.   Allergies: Patient is allergic to allegra [fexofenadine], levaquin [levofloxacin], nexium [esomeprazole magnesium], prednisone, and valerian root.   Social History:  Social History   Tobacco Use  . Smoking status: Never Smoker  . Smokeless tobacco: Never Used  Vaping Use  . Vaping Use: Never used  Substance Use Topics  . Alcohol use: No  . Drug use: No    Relationship status: married She lives with her  spouse.   She is not employed- retired. Regular exercise: No History of abuse: No  Family History:   Family History  Problem Relation Age of Onset  . Heart disease Father   . Emphysema Father        smoked and worked with asbestos  . Dementia Mother   . Colon cancer Neg Hx   . Esophageal cancer Neg Hx   . Rectal cancer Neg Hx      Review of Systems: Review of Systems  Constitutional: Negative for fever, malaise/fatigue and weight loss.  Respiratory: Negative for cough, shortness of breath and wheezing.   Cardiovascular: Negative for chest pain, palpitations and leg swelling.  Gastrointestinal: Negative for abdominal pain and blood in stool.  Genitourinary: Negative for dysuria.  Musculoskeletal: Negative for myalgias.  Skin: Negative for rash.  Neurological: Negative for dizziness and headaches.  Endo/Heme/Allergies: Does not bruise/bleed easily.  Psychiatric/Behavioral: Negative for depression. The patient is not nervous/anxious.      OBJECTIVE Physical Exam: Vitals:   09/29/20 0955  BP: 125/80  Pulse: 62  Height: 5\' 1"  (1.549 m)    Physical Exam Constitutional:      General: She is not in acute distress. Pulmonary:     Effort: Pulmonary effort is normal.  Abdominal:     General: There is no distension.     Palpations: Abdomen is soft.     Tenderness: There is no abdominal tenderness. There is no rebound.  Musculoskeletal:        General: No swelling. Normal range of motion.  Skin:    General: Skin is warm and dry.     Findings: No rash.  Neurological:     Mental Status: She is alert and oriented to person, place, and time.  Psychiatric:        Mood and Affect: Mood normal.        Behavior: Behavior normal.      GU / Detailed Urogynecologic Evaluation:  Pelvic Exam: Normal external female genitalia; Bartholin's and Skene's glands normal in appearance; urethral meatus normal in appearance, no urethral masses or discharge.   CST: negative  Speculum  exam reveals normal vaginal mucosa with atrophy. No vaginal discharge present. Cervix normal appearance. Uterus normal single, nontender. Adnexa no mass, fullness, tenderness.    Pelvic floor strength I/V  Pelvic floor musculature: Right levator tender, Right obturator non-tender, Left levator tender, Left obturator non-tender  POP-Q:   POP-Q  -2  Aa   -2                                           Ba  -6                                              C   2                                            Gh  4                                            Pb  7                                            tvl   -2                                            Ap  -2                                            Bp  -6                                              D     Rectal Exam:  Normal external rectum  Post-Void Residual (PVR) by Bladder Scan: In order to evaluate bladder emptying, we discussed obtaining a postvoid residual and she agreed to this procedure.  Procedure: The ultrasound unit was placed on the patient's abdomen in the suprapubic region after the patient had voided. A PVR of 34 ml was obtained by bladder scan.  Laboratory Results: POC urine: neagtive  I visualized the urine specimen, noting the specimen to be clear yellow  ASSESSMENT AND PLAN Ms. Canipe is a 80 y.o. with:  1. Vaginal atrophy   2. Urinary urgency   3. Encounter for gynecological examination without abnormal finding    1. Vaginal atrophy - Reviewed that vaginal burning sensation is likely due to the dryness/ atrophy. We discussed the potential risks associated with hormone replacement including stroke, heart attack, and blood clots; and the fact that these risks are very low with vaginal estrogen use due to the very low systemic absorption rate of ~ 0.01% with a twice-week regimen. She has not used estrogen in the past due to her history of breast cancer, but  she reports that the breast cancer was estrogen negative. We reviewed that due to minimal systemic absorption, it would be safe to prescribe.  - she would like to first start  with a natural lubricant such as coconut oil or vitamin E.   2. We discussed the symptoms of overactive bladder (OAB), which include urinary urgency, urinary frequency, nocturia, with or without urge incontinence.  While we do not know the exact etiology of OAB, several treatment options exist. We discussed management including behavioral therapy (decreasing bladder irritants, urge suppression strategies, timed voids, bladder retraining), physical therapy, medication.  - She will start with reducing bladder irritants, list provided. She also with work on bladder retraining. Overall the leakage is not bothersome to her.   3. Patient requesting referral to general OBGYN for annual exam- referral placed.   Follow up as needed  Jaquita Folds, MD   Medical Decision Making:  - Reviewed/ ordered a clinical laboratory test - Review and summation of prior records

## 2020-09-26 LAB — HEPATITIS C ANTIBODY
Hepatitis C Ab: NONREACTIVE
SIGNAL TO CUT-OFF: 0.01 (ref <1.00)

## 2020-09-28 ENCOUNTER — Telehealth: Payer: Self-pay

## 2020-09-28 NOTE — Telephone Encounter (Signed)
Attempt made to contact Emily [name] is a 80 y.o. female re: New pt Pre appt call to collect history information. -Allergy -Medication -Confirm pharmacy -OB history  Pt was not available. LM on the VM for the patient to call back.

## 2020-09-29 ENCOUNTER — Encounter: Payer: Self-pay | Admitting: Obstetrics and Gynecology

## 2020-09-29 ENCOUNTER — Other Ambulatory Visit: Payer: Self-pay

## 2020-09-29 ENCOUNTER — Ambulatory Visit (INDEPENDENT_AMBULATORY_CARE_PROVIDER_SITE_OTHER): Payer: Medicare HMO | Admitting: Obstetrics and Gynecology

## 2020-09-29 VITALS — BP 125/80 | HR 62 | Ht 61.0 in

## 2020-09-29 DIAGNOSIS — Z01419 Encounter for gynecological examination (general) (routine) without abnormal findings: Secondary | ICD-10-CM

## 2020-09-29 DIAGNOSIS — N952 Postmenopausal atrophic vaginitis: Secondary | ICD-10-CM

## 2020-09-29 DIAGNOSIS — R3915 Urgency of urination: Secondary | ICD-10-CM

## 2020-09-29 LAB — POCT URINALYSIS DIPSTICK
Appearance: NORMAL
Bilirubin, UA: NEGATIVE
Blood, UA: NEGATIVE
Glucose, UA: NEGATIVE
Ketones, UA: NEGATIVE
Leukocytes, UA: NEGATIVE
Nitrite, UA: NEGATIVE
Protein, UA: NEGATIVE
Spec Grav, UA: 1.025 (ref 1.010–1.025)
Urobilinogen, UA: 0.2 E.U./dL
pH, UA: 5 (ref 5.0–8.0)

## 2020-09-29 NOTE — Patient Instructions (Addendum)
Today we talked about ways to manage bladder urgency such as altering your diet to avoid irritative beverages and foods (bladder diet) as well as attempting to decrease stress and other exacerbating factors.    The Most Bothersome Foods* The Least Bothersome Foods*  Coffee - Regular & Decaf Tea - caffeinated Carbonated beverages - cola, non-colas, diet & caffeine-free Alcohols - Beer, Red Wine, White Wine, Champagne Fruits - Grapefruit, Lemon, Orange, Pineapple Fruit Juices - Cranberry, Grapefruit, Orange, Pineapple Vegetables - Tomato & Tomato Products Flavor Enhancers - Hot peppers, Spicy foods, Chili, Horseradish, Vinegar, Monosodium glutamate (MSG) Artificial Sweeteners - NutraSweet, Sweet 'N Low, Equal (sweetener), Saccharin Ethnic foods - Mexican, Thai, Indian food Water Milk - low-fat & whole Fruits - Bananas, Blueberries, Honeydew melon, Pears, Raisins, Watermelon Vegetables - Broccoli, Brussels Sprouts, Cabbage, Carrots, Cauliflower, Celery, Cucumber, Mushrooms, Peas, Radishes, Squash, Zucchini, White potatoes, Sweet potatoes & yams Poultry - Chicken, Eggs, Turkey, Meat - Beef, Pork, [name] Seafood - Shrimp, Tuna fish, Salmon Grains - Oat, Rice Snacks - Pretzels, Popcorn  *Friedlander J. et al. Diet and its role in interstitial cystitis/bladder pain syndrome (IC/BPS) and comorbid conditions. [employer]. [employer]. 2012 Jan 11.   Vulvovaginal moisturizer Options: Vitamin E oil (pump or capsule) or cream (Gene's Vit E Cream) Coconut oil Silicone-based lubricant for use during intercourse ("wet platinum" is a brand available at most drugstores) Crisco Consider the ingredients of the product - the fewer the ingredients the better!  Directions for Use: Clean and dry your hands Gently dab the vulvar/vaginal area dry as needed Apply a "pea-sized" amount of the moisturizer onto your fingertip Using you other hand, open the labia  Apply the moisturizer to the vulvar/vaginal  tissues Wear loose fitting underwear/clothing if possible following application Use moisturize up to 3 times daily as desired.  

## 2020-10-17 ENCOUNTER — Ambulatory Visit: Payer: Medicare HMO | Admitting: Obstetrics and Gynecology

## 2020-10-20 ENCOUNTER — Other Ambulatory Visit: Payer: Self-pay | Admitting: Internal Medicine

## 2020-10-20 DIAGNOSIS — M5459 Other low back pain: Secondary | ICD-10-CM | POA: Diagnosis not present

## 2020-10-23 ENCOUNTER — Encounter: Payer: Medicare HMO | Admitting: Obstetrics and Gynecology

## 2020-10-25 DIAGNOSIS — M545 Low back pain, unspecified: Secondary | ICD-10-CM | POA: Diagnosis not present

## 2020-11-06 DIAGNOSIS — M545 Low back pain, unspecified: Secondary | ICD-10-CM | POA: Diagnosis not present

## 2020-11-07 ENCOUNTER — Telehealth: Payer: Self-pay | Admitting: Internal Medicine

## 2020-11-07 NOTE — Telephone Encounter (Signed)
LVM for pt to rtn my call to schedule AWV with [facility]. Please schedule AWV if pt calls the office  

## 2020-11-15 DIAGNOSIS — M545 Low back pain, unspecified: Secondary | ICD-10-CM | POA: Diagnosis not present

## 2020-11-17 DIAGNOSIS — M545 Low back pain, unspecified: Secondary | ICD-10-CM | POA: Diagnosis not present

## 2020-11-17 DIAGNOSIS — N898 Other specified noninflammatory disorders of vagina: Secondary | ICD-10-CM | POA: Diagnosis not present

## 2020-11-17 DIAGNOSIS — L292 Pruritus vulvae: Secondary | ICD-10-CM | POA: Diagnosis not present

## 2020-11-22 DIAGNOSIS — M545 Low back pain, unspecified: Secondary | ICD-10-CM | POA: Diagnosis not present

## 2020-11-29 ENCOUNTER — Ambulatory Visit (INDEPENDENT_AMBULATORY_CARE_PROVIDER_SITE_OTHER): Payer: Medicare HMO

## 2020-11-29 DIAGNOSIS — Z0000 Encounter for general adult medical examination without abnormal findings: Secondary | ICD-10-CM

## 2020-11-29 NOTE — Progress Notes (Signed)
I connected with [name] today by telephone and verified that I am speaking with the correct person using two identifiers. Location patient: home Location provider: work Persons participating in the virtual visit: Luverta Korte and Lisette Abu, LPN.   I discussed the limitations, risks, security and privacy concerns of performing an evaluation and management service by telephone and the availability of in person appointments. I also discussed with the patient that there may be a patient responsible charge related to this service. The patient expressed understanding and verbally consented to this telephonic visit.    Interactive audio and video telecommunications were attempted between this provider and patient, however failed, due to patient having technical difficulties OR patient did not have access to video capability.  We continued and completed visit with audio only.  Some vital signs may be absent or patient reported.   Time Spent with patient on telephone encounter: 30 minutes  Subjective:   Emily [name] is a 80 y.o. female who presents for Medicare Annual (Subsequent) preventive examination.  Review of Systems    No ROS. Medicare Wellness Virtual Visit. Additional risk factors are reflected in social history. Cardiac Risk Factors include: advanced age (>49men, >34 women);dyslipidemia;family history of premature cardiovascular disease Sleep Patterns: No sleep issues, feels rested on waking and sleeps 6 hours nightly. Home Safety/Smoke Alarms: Feels safe in home; uses home alarm. Smoke alarms in place. Living environment: 2-story home; Lives with spouse; no needs for DME; good support system. Seat Belt Safety/Bike Helmet: Wears seat belt.    Objective:    There were no vitals filed for this visit. There is no height or weight on file to calculate BMI.  Advanced Directives 11/29/2020 09/16/2018 04/20/2015  Does Patient Have a Medical Advance Directive? Yes Yes Yes   Type of Advance Directive Living will;Healthcare Power of [place];Living will -  Does patient want to make changes to medical advance directive? No - Patient declined - -  Copy of [place] in Chart? No - copy requested No - copy requested Yes    Current Medications (verified) Outpatient Encounter Medications as of 11/29/2020  Medication Sig  . atenolol (TENORMIN) 25 MG tablet TAKE 1 TABLET BY MOUTH EVERY DAY  . azelastine (ASTELIN) 0.1 % nasal spray Place 2 sprays into both nostrils daily as needed for rhinitis. Use in each nostril as directed  . Calcium-Vitamin D-Vitamin K [id number] MG-UNT-MCG TABS Take 1 tablet by mouth 2 (two) times daily.  . cholecalciferol (VITAMIN D) 25 MCG (1000 UNIT) tablet Take 1,000 Units by mouth daily.  Marland Kitchen levothyroxine (SYNTHROID) 75 MCG tablet Take 1 tablet (75 mcg total) by mouth daily. Take 6 days a week only  . gabapentin (NEURONTIN) 100 MG capsule Take 1 capsule (100 mg total) by mouth at bedtime. (Patient not taking: Reported on 11/29/2020)  . ipratropium (ATROVENT) 0.03 % nasal spray INSTILL 2 SPRAYS IN EACH NOSTRIL EVERY 12 HOURS (Patient not taking: Reported on 11/29/2020)   No facility-administered encounter medications on file as of 11/29/2020.    Allergies (verified) Allegra [fexofenadine], Levaquin [levofloxacin], Nexium [esomeprazole magnesium], Prednisone, and Valerian root   History: Past Medical History:  Diagnosis Date  . Actinic keratosis   . Allergic rhinitis   . BACK PAIN   . 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 Cervicalgia   . Cholelithiasis   . DIVERTICULOSIS, COLON   . ENDOMETRIOSIS   . GERD (gastroesophageal  reflux disease)   . Heart palpitations   . Hypertension   . HYPOTHYROIDISM   . Left wrist fracture 11/06/2016   10/2016 - fell of high stool  . Personal history of chemotherapy   . Personal history of radiation therapy    . Pleural plaque without asbestos    CT chest 10/2010: R>L lobes, upper> lower  . Squamous cell carcinoma    History of BC   Past Surgical History:  Procedure Laterality Date  . BREAST LUMPECTOMY  2001   left  . CATARACT EXTRACTION Bilateral 09/2014   both eyes  . CHOLECYSTECTOMY  2010  . ENDOMETRIAL ABLATION    . EXCISION / BIOPSY BREAST / NIPPLE / DUCT Left 2001   Family History  Problem Relation Age of Onset  . Heart disease Father   . Emphysema Father        smoked and worked with asbestos  . Dementia Mother   . Colon cancer Neg Hx   . Esophageal cancer Neg Hx   . Rectal cancer Neg Hx    Social History   Socioeconomic History  . Marital status: Married    Spouse name: Not on file  . Number of children: 2  . Years of education: Not on file  . Highest education level: Not on file  Occupational History  . Occupation: Retired    Comment: Chiropractor  Tobacco Use  . Smoking status: Never Smoker  . Smokeless tobacco: Never Used  Vaping Use  . Vaping Use: Never used  Substance and Sexual Activity  . Alcohol use: No  . Drug use: No  . Sexual activity: Not Currently  Other Topics Concern  . Not on file  Social History Narrative   Married, lives in [place] area since 2008 from [place] to be close to dtr   Social Determinants of Health   Financial Resource Strain: Low Risk   . Difficulty of Paying Living Expenses: Not hard at all  Food Insecurity: No Food Insecurity  . Worried About Charity fundraiser in the Last Year: Never true  . Ran Out of Food in the Last Year: Never true  Transportation Needs: No Transportation Needs  . Lack of Transportation (Medical): No  . Lack of Transportation (Non-Medical): No  Physical Activity: Sufficiently Active  . Days of Exercise per Week: 5 days  . Minutes of Exercise per Session: 30 min  Stress: No Stress Concern Present  . Feeling of Stress : Not at all  Social Connections: Socially Integrated  . Frequency of  Communication with Friends and Family: More than three times a week  . Frequency of Social Gatherings with Friends and Family: More than three times a week  . Attends Religious Services: More than 4 times per year  . Active Member of Clubs or Organizations: Yes  . Attends Archivist Meetings: 1 to 4 times per year  . Marital Status: Married    Tobacco Counseling Counseling given: Not Answered   Clinical Intake:  Pre-visit preparation completed: Yes  Pain : No/denies pain     Nutritional Risks: None Diabetes: No  How often do you need to have someone help you when you read instructions, pamphlets, or other written materials from your doctor or pharmacy?: 1 - Never What is the last grade level you completed in school?: High School Graduate  Diabetic? no  Interpreter Needed?: No  Information entered by :: Lisette Abu, LPN   Activities of Daily Living In your present state  of health, do you have any difficulty performing the following activities: 11/29/2020 08/01/2020  Hearing? N N  Vision? N N  Difficulty concentrating or making decisions? N N  Walking or climbing stairs? N N  Dressing or bathing? N N  Doing errands, shopping? N N  Preparing Food and eating ? N -  Using the Toilet? N -  In the past six months, have you accidently leaked urine? N -  Do you have problems with loss of bowel control? N -  Managing your Medications? N -  Managing your Finances? N -  Housekeeping or managing your Housekeeping? N -  Some recent data might be hidden    Patient Care Team: Binnie Rail, MD as PCP - General (Internal Medicine) Buford Dresser, MD as PCP - Cardiology (Cardiology) Servando Salina, MD as Consulting Physician (Obstetrics and Gynecology) Jackolyn Confer, MD as Consulting Physician (General Surgery) Tanda Rockers, MD as Consulting Physician (Pulmonary Disease) Ladell Pier, MD as Consulting Physician (Internal Medicine) Marchia Bond, MD (Orthopedic Surgery) Calvert Cantor, MD (Ophthalmology)  Indicate any recent Medical Services you may have received from other than [facility] providers in the past year (date may be approximate).     Assessment:   This is a routine wellness examination for Emily [name].  Hearing/Vision screen No exam data present  Dietary issues and exercise activities discussed: Current Exercise Habits: Home exercise routine, Type of exercise: treadmill;walking;Other - see comments (Physical Therapy exercises; Playing Marjan  2 times a week with neighbors; watching Grandkids every Wednesday), Time (Minutes): 30, Frequency (Times/Week): 5, Weekly Exercise (Minutes/Week): 150, Intensity: Mild, Exercise limited by: orthopedic condition(s);respiratory conditions(s)  Goals    . Exercise 150 minutes per week (moderate activity)     Return to water walking x 2 per week    . Patient Stated     I am going walk on my treadmill, increase the amount of fluid I drink and work towards improving how much I sleep.      Depression Screen PHQ 2/9 Scores 11/29/2020 09/25/2020 09/23/2019 09/16/2018 09/12/2017 04/20/2015 07/14/2013  PHQ - 2 Score 0 0 0 0 0 0 0  PHQ- 9 Score - 2 - - - - -    Fall Risk Fall Risk  11/29/2020 11/17/2019 09/23/2019 09/16/2018 09/12/2017  Falls in the past year? 0 1 1 0 Yes  Number falls in past yr: 0 1 0 - 1  Injury with Fall? 0 1 0 - Yes  Risk for fall due to : No Fall Risks - - - -  Follow up Falls evaluation completed - - - -    FALL RISK PREVENTION PERTAINING TO THE HOME:  Any stairs in or around the home? Yes  If so, are there any without handrails? No  Home free of loose throw rugs in walkways, pet beds, electrical cords, etc? Yes  Adequate lighting in your home to reduce risk of falls? Yes   ASSISTIVE DEVICES UTILIZED TO PREVENT FALLS:  Life alert? No  Use of a cane, walker or w/c? No  Grab bars in the bathroom? Yes  Shower chair or bench in shower? Yes  Elevated toilet seat or a  handicapped toilet? Yes   TIMED UP AND GO:  Was the test performed? No .  Length of time to ambulate 10 feet: 0 sec.   Gait steady and fast without use of assistive device  Cognitive Function: MMSE - Mini Mental State Exam 04/20/2015  Not completed: (No Data)  Immunizations Immunization History  Administered Date(s) Administered  . Fluad Quad(high Dose 65+) 06/09/2019  . Influenza Split 05/20/2011, 06/10/2012  . Influenza Whole 06/18/2007  . Influenza, High Dose Seasonal PF 06/07/2013, 04/20/2015, 07/25/2016, 06/19/2017, 07/10/2018, 06/22/2020  . Influenza,inj,Quad PF,6+ Mos 05/31/2014  . PFIZER(Purple Top)SARS-COV-2 Vaccination 09/08/2019, 09/29/2019  . Pneumococcal Conjugate-13 07/28/2014  . Pneumococcal Polysaccharide-23 06/18/2007  . Td 06/18/2007  . Tdap 07/06/2017  . Zoster 11/12/2011  . Zoster Recombinat (Shingrix) 03/24/2020, 09/22/2020    TDAP status: Up to date  Flu Vaccine status: Up to date  Pneumococcal vaccine status: Up to date  Covid-19 vaccine status: Completed vaccines  Qualifies for Shingles Vaccine? Yes   Zostavax completed Yes   Shingrix Completed?: Yes  Screening Tests Health Maintenance  Topic Date Due  . COVID-19 Vaccine (3 - Pfizer risk 4-dose series) 10/27/2019  . INFLUENZA VACCINE  03/19/2021  . DEXA SCAN  09/27/2021  . TETANUS/TDAP  07/07/2027  . Hepatitis C Screening  Completed  . PNA vac Low Risk Adult  Completed  . HPV VACCINES  Aged Out    Health Maintenance  Health Maintenance Due  Topic Date Due  . COVID-19 Vaccine (3 - Pfizer risk 4-dose series) 10/27/2019    Colorectal cancer screening: Type of screening: Colonoscopy. Completed 2010. Repeat every 10 years  Mammogram status: Completed 02/08/2020. Repeat every year  Bone Density status: Completed 09/28/2019. Results reflect: Bone density results: OSTEOPOROSIS. Repeat every 2 years.  Lung Cancer Screening: (Low Dose CT Chest recommended if Age 54-80 years, 30  pack-year currently smoking OR have quit w/in 15years.) does not qualify.   Lung Cancer Screening Referral: no  Additional Screening:  Hepatitis C Screening: does qualify; Completed yes  Vision Screening: Recommended annual ophthalmology exams for early detection of glaucoma and other disorders of the eye. Is the patient up to date with their annual eye exam?  Yes  Who is the provider or what is the name of the office in which the patient attends annual eye exams? Calvert Cantor, MD. If pt is not established with a provider, would they like to be referred to a provider to establish care? No .   Dental Screening: Recommended annual dental exams for proper oral hygiene  Community Resource Referral / Chronic Care Management: CRR required this visit?  No   CCM required this visit?  No      Plan:     I have personally reviewed and noted the following in the patient's chart:   . Medical and social history . Use of alcohol, tobacco or illicit drugs  . Current medications and supplements . Functional ability and status . Nutritional status . Physical activity . Advanced directives . List of other physicians . Hospitalizations, surgeries, and ER visits in previous 12 months . Vitals . Screenings to include cognitive, depression, and falls . Referrals and appointments  In addition, I have reviewed and discussed with patient certain preventive protocols, quality metrics, and best practice recommendations. A written personalized care plan for preventive services as well as general preventive health recommendations were provided to patient.     Sheral Flow, LPN   [id number]   Nurse Notes:  Patient is cogitatively intact. There were no vitals filed for this visit. There is no height or weight on file to calculate BMI. Patient stated that she has no issues with gait or balance; does not use any assistive devices. Medications reviewed with patient; no opioid use noted. Patient  will speak with primary care at next  visit regarding Cologuard.

## 2020-11-29 NOTE — Patient Instructions (Signed)
Emily [name] , Thank you for taking time to come for your Medicare Wellness Visit. I appreciate your ongoing commitment to your health goals. Please review the following plan we discussed and let me know if I can assist you in the future.   Screening recommendations/referrals: Colonoscopy: last done 2010 per patient; repeat every 10 years Mammogram: 02/08/2020; due every 1-2 years Bone Density: 09/28/2019; due every 2 years Recommended yearly ophthalmology/optometry visit for glaucoma screening and checkup Recommended yearly dental visit for hygiene and checkup  Vaccinations: Influenza vaccine: 06/22/2020 Pneumococcal vaccine: 06/18/2007, 07/28/2014 Tdap vaccine: 07/06/2017; due every 10 years Shingles vaccine: 03/24/2020, 09/22/2020   Covid-19: 09/08/2019, 09/29/2019, 05/12/2020  Advanced directives: Please bring a copy of your health care power of attorney and living will to the office at your convenience.  Conditions/risks identified: Yes; Reviewed health maintenance screenings with patient today and relevant education, vaccines, and/or referrals were provided. Please continue to do your personal lifestyle choices by: daily care of teeth and gums, regular physical activity (goal should be 5 days a week for 30 minutes), eat a healthy diet, avoid tobacco and drug use, limiting any alcohol intake, taking a low-dose aspirin (if not allergic or have been advised by your provider otherwise) and taking vitamins and minerals as recommended by your provider. Continue doing brain stimulating activities (puzzles, reading, adult coloring books, staying active) to keep memory sharp. Continue to eat heart healthy diet (full of fruits, vegetables, whole grains, lean protein, water--limit salt, fat, and sugar intake) and increase physical activity as tolerated.  Next appointment: Please schedule your next Medicare Wellness Visit with your Nurse Health Advisor in 1 year by calling [phone].   Preventive Care 79  Years and Older, Female Preventive care refers to lifestyle choices and visits with your health care provider that can promote health and wellness. What does preventive care include?  A yearly physical exam. This is also called an annual well check.  Dental exams once or twice a year.  Routine eye exams. Ask your health care provider how often you should have your eyes checked.  Personal lifestyle choices, including:  Daily care of your teeth and gums.  Regular physical activity.  Eating a healthy diet.  Avoiding tobacco and drug use.  Limiting alcohol use.  Practicing safe sex.  Taking low-dose aspirin every day.  Taking vitamin and mineral supplements as recommended by your health care provider. What happens during an annual well check? The services and screenings done by your health care provider during your annual well check will depend on your age, overall health, lifestyle risk factors, and family history of disease. Counseling  Your health care provider may ask you questions about your:  Alcohol use.  Tobacco use.  Drug use.  Emotional well-being.  Home and relationship well-being.  Sexual activity.  Eating habits.  History of falls.  Memory and ability to understand (cognition).  Work and work Statistician.  Reproductive health. Screening  You may have the following tests or measurements:  Height, weight, and BMI.  Blood pressure.  Lipid and cholesterol levels. These may be checked every 5 years, or more frequently if you are over 61 years old.  Skin check.  Lung cancer screening. You may have this screening every year starting at age 24 if you have a 30-pack-year history of smoking and currently smoke or have quit within the past 15 years.  Fecal occult blood test (FOBT) of the stool. You may have this test every year starting at age 78.  Flexible sigmoidoscopy or colonoscopy. You may have a sigmoidoscopy every 5 years or a colonoscopy every  10 years starting at age 20.  Hepatitis C blood test.  Hepatitis B blood test.  Sexually transmitted disease (STD) testing.  Diabetes screening. This is done by checking your blood sugar (glucose) after you have not eaten for a while (fasting). You may have this done every 1-3 years.  Bone density scan. This is done to screen for osteoporosis. You may have this done starting at age 26.  Mammogram. This may be done every 1-2 years. Talk to your health care provider about how often you should have regular mammograms. Talk with your health care provider about your test results, treatment options, and if necessary, the need for more tests. Vaccines  Your health care provider may recommend certain vaccines, such as:  Influenza vaccine. This is recommended every year.  Tetanus, diphtheria, and acellular pertussis (Tdap, Td) vaccine. You may need a Td booster every 10 years.  Zoster vaccine. You may need this after age 50.  Pneumococcal 13-valent conjugate (PCV13) vaccine. One dose is recommended after age 8.  Pneumococcal polysaccharide (PPSV23) vaccine. One dose is recommended after age 47. Talk to your health care provider about which screenings and vaccines you need and how often you need them. This information is not intended to replace advice given to you by your health care provider. Make sure you discuss any questions you have with your health care provider. Document Released: 09/01/2015 Document Revised: 04/24/2016 Document Reviewed: 06/06/2015 Elsevier Interactive Patient Education  2017 [employer] Prevention in the Home Falls can cause injuries. They can happen to people of all ages. There are many things you can do to make your home safe and to help prevent falls. What can I do on the outside of my home?  Regularly fix the edges of walkways and driveways and fix any cracks.  Remove anything that might make you trip as you walk through a door, such as a raised step  or threshold.  Trim any bushes or trees on the path to your home.  Use bright outdoor lighting.  Clear any walking paths of anything that might make someone trip, such as rocks or tools.  Regularly check to see if handrails are loose or broken. Make sure that both sides of any steps have handrails.  Any raised decks and porches should have guardrails on the edges.  Have any leaves, snow, or ice cleared regularly.  Use sand or salt on walking paths during winter.  Clean up any spills in your garage right away. This includes oil or grease spills. What can I do in the bathroom?  Use night lights.  Install grab bars by the toilet and in the tub and shower. Do not use towel bars as grab bars.  Use non-skid mats or decals in the tub or shower.  If you need to sit down in the shower, use a plastic, non-slip stool.  Keep the floor dry. Clean up any water that spills on the floor as soon as it happens.  Remove soap buildup in the tub or shower regularly.  Attach bath mats securely with double-sided non-slip rug tape.  Do not have throw rugs and other things on the floor that can make you trip. What can I do in the bedroom?  Use night lights.  Make sure that you have a light by your bed that is easy to reach.  Do not use any sheets or blankets  that are too big for your bed. They should not hang down onto the floor.  Have a firm chair that has side arms. You can use this for support while you get dressed.  Do not have throw rugs and other things on the floor that can make you trip. What can I do in the kitchen?  Clean up any spills right away.  Avoid walking on wet floors.  Keep items that you use a lot in easy-to-reach places.  If you need to reach something above you, use a strong step stool that has a grab bar.  Keep electrical cords out of the way.  Do not use floor polish or wax that makes floors slippery. If you must use wax, use non-skid floor wax.  Do not have  throw rugs and other things on the floor that can make you trip. What can I do with my stairs?  Do not leave any items on the stairs.  Make sure that there are handrails on both sides of the stairs and use them. Fix handrails that are broken or loose. Make sure that handrails are as long as the stairways.  Check any carpeting to make sure that it is firmly attached to the stairs. Fix any carpet that is loose or worn.  Avoid having throw rugs at the top or bottom of the stairs. If you do have throw rugs, attach them to the floor with carpet tape.  Make sure that you have a light switch at the top of the stairs and the bottom of the stairs. If you do not have them, ask someone to add them for you. What else can I do to help prevent falls?  Wear shoes that:  Do not have high heels.  Have rubber bottoms.  Are comfortable and fit you well.  Are closed at the toe. Do not wear sandals.  If you use a stepladder:  Make sure that it is fully opened. Do not climb a closed stepladder.  Make sure that both sides of the stepladder are locked into place.  Ask someone to hold it for you, if possible.  Clearly mark and make sure that you can see:  Any grab bars or handrails.  First and last steps.  Where the edge of each step is.  Use tools that help you move around (mobility aids) if they are needed. These include:  Canes.  Walkers.  Scooters.  Crutches.  Turn on the lights when you go into a dark area. Replace any light bulbs as soon as they burn out.  Set up your furniture so you have a clear path. Avoid moving your furniture around.  If any of your floors are uneven, fix them.  If there are any pets around you, be aware of where they are.  Review your medicines with your doctor. Some medicines can make you feel dizzy. This can increase your chance of falling. Ask your doctor what other things that you can do to help prevent falls. This information is not intended to  replace advice given to you by your health care provider. Make sure you discuss any questions you have with your health care provider. Document Released: 06/01/2009 Document Revised: 01/11/2016 Document Reviewed: 09/09/2014 Elsevier Interactive Patient Education  2017 [employer].

## 2020-12-07 ENCOUNTER — Telehealth: Payer: Self-pay

## 2020-12-07 ENCOUNTER — Other Ambulatory Visit: Payer: Self-pay

## 2020-12-07 DIAGNOSIS — Z1211 Encounter for screening for malignant neoplasm of colon: Secondary | ICD-10-CM

## 2020-12-07 NOTE — Telephone Encounter (Signed)
-----   Message from Binnie Rail, MD sent at 11/29/2020  9:58 AM EDT ----- She would like a cologuard

## 2020-12-07 NOTE — Telephone Encounter (Signed)
Ordered today 

## 2020-12-08 ENCOUNTER — Other Ambulatory Visit: Payer: Self-pay | Admitting: Internal Medicine

## 2020-12-08 DIAGNOSIS — Z1231 Encounter for screening mammogram for malignant neoplasm of breast: Secondary | ICD-10-CM

## 2020-12-14 DIAGNOSIS — M9903 Segmental and somatic dysfunction of lumbar region: Secondary | ICD-10-CM | POA: Diagnosis not present

## 2020-12-14 DIAGNOSIS — M5432 Sciatica, left side: Secondary | ICD-10-CM | POA: Diagnosis not present

## 2020-12-19 DIAGNOSIS — M5432 Sciatica, left side: Secondary | ICD-10-CM | POA: Diagnosis not present

## 2020-12-19 DIAGNOSIS — M9903 Segmental and somatic dysfunction of lumbar region: Secondary | ICD-10-CM | POA: Diagnosis not present

## 2020-12-20 DIAGNOSIS — M9903 Segmental and somatic dysfunction of lumbar region: Secondary | ICD-10-CM | POA: Diagnosis not present

## 2020-12-20 DIAGNOSIS — M5432 Sciatica, left side: Secondary | ICD-10-CM | POA: Diagnosis not present

## 2020-12-21 DIAGNOSIS — M5432 Sciatica, left side: Secondary | ICD-10-CM | POA: Diagnosis not present

## 2020-12-21 DIAGNOSIS — M9903 Segmental and somatic dysfunction of lumbar region: Secondary | ICD-10-CM | POA: Diagnosis not present

## 2020-12-25 ENCOUNTER — Encounter: Payer: Self-pay | Admitting: Internal Medicine

## 2020-12-27 ENCOUNTER — Encounter: Payer: Self-pay | Admitting: Internal Medicine

## 2021-01-03 DIAGNOSIS — M5432 Sciatica, left side: Secondary | ICD-10-CM | POA: Diagnosis not present

## 2021-01-03 DIAGNOSIS — M9903 Segmental and somatic dysfunction of lumbar region: Secondary | ICD-10-CM | POA: Diagnosis not present

## 2021-01-04 DIAGNOSIS — M9903 Segmental and somatic dysfunction of lumbar region: Secondary | ICD-10-CM | POA: Diagnosis not present

## 2021-01-04 DIAGNOSIS — M5432 Sciatica, left side: Secondary | ICD-10-CM | POA: Diagnosis not present

## 2021-01-09 DIAGNOSIS — M9903 Segmental and somatic dysfunction of lumbar region: Secondary | ICD-10-CM | POA: Diagnosis not present

## 2021-01-09 DIAGNOSIS — M5432 Sciatica, left side: Secondary | ICD-10-CM | POA: Diagnosis not present

## 2021-01-11 DIAGNOSIS — M5432 Sciatica, left side: Secondary | ICD-10-CM | POA: Diagnosis not present

## 2021-01-11 DIAGNOSIS — M9903 Segmental and somatic dysfunction of lumbar region: Secondary | ICD-10-CM | POA: Diagnosis not present

## 2021-01-24 ENCOUNTER — Other Ambulatory Visit: Payer: Self-pay | Admitting: Internal Medicine

## 2021-02-08 DIAGNOSIS — H40013 Open angle with borderline findings, low risk, bilateral: Secondary | ICD-10-CM | POA: Diagnosis not present

## 2021-02-08 DIAGNOSIS — H43813 Vitreous degeneration, bilateral: Secondary | ICD-10-CM | POA: Diagnosis not present

## 2021-02-08 DIAGNOSIS — H35372 Puckering of macula, left eye: Secondary | ICD-10-CM | POA: Diagnosis not present

## 2021-02-09 ENCOUNTER — Other Ambulatory Visit: Payer: Self-pay

## 2021-02-09 ENCOUNTER — Ambulatory Visit
Admission: RE | Admit: 2021-02-09 | Discharge: 2021-02-09 | Disposition: A | Discharge status: Home / Self Care | Payer: Medicare HMO | Source: Ambulatory Visit | Attending: Internal Medicine | Admitting: Internal Medicine

## 2021-02-09 DIAGNOSIS — Z1231 Encounter for screening mammogram for malignant neoplasm of breast: Secondary | ICD-10-CM

## 2021-02-22 ENCOUNTER — Other Ambulatory Visit: Payer: Self-pay | Admitting: Internal Medicine

## 2021-02-26 NOTE — Progress Notes (Signed)
Subjective:    Patient ID: Emily [name], female    DOB: [birth year], 80 y.o.   MRN: [id number]  HPI The patient is here for an acute visit.   Spot on upper lip - she noticed it about 2 weeks and it started growing fast so she was concerned.  She denies any pain, itching or trauma to the area.   She has an appt with Dr Delman Cheadle on Sept 30th.    Medications and allergies reviewed with patient and updated if appropriate.  Patient Active Problem List   Diagnosis Date Noted   Aortic atherosclerosis ([place]) 09/22/2020   Urinary urgency 03/23/2020   CKD (chronic kidney disease) stage 3, GFR 30-59 ml/min (HCC) 03/22/2020   Hyperglycemia 03/22/2020   Change in stool 11/17/2019   Right-sided chest wall pain 11/17/2019   Urinary retention 09/23/2019   SOB (shortness of breath) 01/15/2019   Upper airway cough syndrome 01/15/2019   Belching 12/14/2018   Reactive airway disease 12/05/2018   Pre-syncope 05/19/2018   DDD (degenerative disc disease), cervical 01/15/2018   Neuralgia 09/12/2017   Memory difficulties 11/06/2016   IBS (irritable bowel syndrome) 04/16/2016   Hyperlipidemia 09/10/2015   Osteopenia 09/08/2015   Palpitations    Pleural plaque without asbestos 12/03/2010   Cervicalgia 12/18/2009   DIVERTICULOSIS, COLON 03/02/2009   Lumbago 03/11/2008   Actinic keratosis 01/23/2008   CARCINOMA, BREAST, ESTROGEN RECEPTOR NEGATIVE 10/12/2007   Hypothyroidism 06/18/2007   Allergic rhinitis 06/11/2007    Current Outpatient Medications on File Prior to Visit  Medication Sig Dispense Refill   atenolol (TENORMIN) 25 MG tablet TAKE 1 TABLET BY MOUTH EVERY DAY 90 tablet 1   azelastine (ASTELIN) 0.1 % nasal spray Place 2 sprays into both nostrils daily as needed for rhinitis. Use in each nostril as directed 30 mL 12   Calcium-Vitamin D-Vitamin K 750-500-40 MG-UNT-MCG TABS Take 1 tablet by mouth 2 (two) times daily.     cholecalciferol (VITAMIN D) 25 MCG (1000 UNIT) tablet Take  1,000 Units by mouth daily.     levothyroxine (SYNTHROID) 75 MCG tablet TAKE 1 TABLET BY MOUTH EVERY DAY 90 tablet 1   ZYRTEC-D ALLERGY & CONGESTION 5-120 MG tablet TAKE 1 TABLET BY MOUTH TWICE A DAY 48 tablet 14   No current facility-administered medications on file prior to visit.    Past Medical History:  Diagnosis Date   Actinic keratosis    Allergic rhinitis    BACK PAIN    CARCINOMA, BREAST, 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   L s/p lumpectomy, chemo and XRT   Cervicalgia    Cholelithiasis    DIVERTICULOSIS, COLON    ENDOMETRIOSIS    GERD (gastroesophageal reflux disease)    Heart palpitations    Hypertension    HYPOTHYROIDISM    Left wrist fracture 11/06/2016   10/2016 - fell of high stool   Personal history of chemotherapy    Personal history of radiation therapy    Pleural plaque without asbestos    CT chest 10/2010: R>L lobes, upper> lower   Squamous cell carcinoma    History of BC    Past Surgical History:  Procedure Laterality Date   BREAST LUMPECTOMY  2001   left   CATARACT EXTRACTION Bilateral 09/2014   both eyes   CHOLECYSTECTOMY  2010   ENDOMETRIAL ABLATION     EXCISION / BIOPSY BREAST / NIPPLE / DUCT Left 2001    Social History   Socioeconomic History   Marital status: Married  Spouse name: Not on file   Number of children: 2   Years of education: Not on file   Highest education level: Not on file  Occupational History   Occupation: Retired    Comment: Chiropractor  Tobacco Use   Smoking status: Never   Smokeless tobacco: Never  Vaping Use   Vaping Use: Never used  Substance and Sexual Activity   Alcohol use: No   Drug use: No   Sexual activity: Not Currently  Other Topics Concern   Not on file  Social History Narrative   Married, lives in [place] area since 2008 from [place] to be close to dtr   Social Determinants of Radio broadcast assistant Strain: Low Risk    Difficulty of Paying Living Expenses: Not hard at all  Food  Insecurity: No Food Insecurity   Worried About Charity fundraiser in the Last Year: Never true   Arboriculturist in the Last Year: Never true  Transportation Needs: No Transportation Needs   Lack of Transportation (Medical): No   Lack of Transportation (Non-Medical): No  Physical Activity: Sufficiently Active   Days of Exercise per Week: 5 days   Minutes of Exercise per Session: 30 min  Stress: No Stress Concern Present   Feeling of Stress : Not at all  Social Connections: Socially Integrated   Frequency of Communication with Friends and Family: More than three times a week   Frequency of Social Gatherings with Friends and Family: More than three times a week   Attends Religious Services: More than 4 times per year   Active Member of [employer] or Organizations: Yes   Attends Archivist Meetings: 1 to 4 times per year   Marital Status: Married    Family History  Problem Relation Age of Onset   Heart disease Father    Emphysema Father        smoked and worked with asbestos   Dementia Mother    Colon cancer Neg Hx    Esophageal cancer Neg Hx    Rectal cancer Neg Hx     Review of Systems     Objective:   Vitals:   02/27/21 1044  BP: 120/70  Pulse: 68  Temp: 98 F (36.7 C)  SpO2: 95%   BP Readings from Last 3 Encounters:  02/27/21 120/70  09/29/20 125/80  09/25/20 118/72   Wt Readings from Last 3 Encounters:  02/27/21 122 lb (55.3 kg)  09/25/20 122 lb (55.3 kg)  08/01/20 122 lb 6.4 oz (55.5 kg)   Body mass index is 23.05 kg/m.   Physical Exam Constitutional:      General: She is not in acute distress.    Appearance: Normal appearance. She is not ill-appearing.  HENT:     Head: Normocephalic and atraumatic.  Skin:    General: Skin is warm and dry.     Findings: Lesion (Right upper lip near cupids bow there is a white cyst on edge of lip with oblong swelling extending up toward nose-approximately the size of half a centimeter-nontender, no fluctuance,  no erythema) present.  Neurological:     Mental Status: She is alert.           Assessment & Plan:    See Problem List for Assessment and Plan of chronic medical problems.    This visit occurred during the SARS-CoV-2 public health emergency.  Safety protocols were in place, including screening questions prior to the visit, additional usage  of staff PPE, and extensive cleaning of exam room while observing appropriate contact time as indicated for disinfecting solutions.

## 2021-02-27 ENCOUNTER — Ambulatory Visit: Payer: Medicare HMO | Admitting: Internal Medicine

## 2021-02-27 ENCOUNTER — Ambulatory Visit (INDEPENDENT_AMBULATORY_CARE_PROVIDER_SITE_OTHER): Payer: Medicare HMO | Admitting: Internal Medicine

## 2021-02-27 ENCOUNTER — Other Ambulatory Visit: Payer: Self-pay

## 2021-02-27 ENCOUNTER — Encounter: Payer: Self-pay | Admitting: Internal Medicine

## 2021-02-27 DIAGNOSIS — L989 Disorder of the skin and subcutaneous tissue, unspecified: Secondary | ICD-10-CM | POA: Diagnosis not present

## 2021-02-27 NOTE — Assessment & Plan Note (Signed)
Acute Lesion of right upper lip that has been growing over the past 2 weeks No itching, no tenderness, no evidence of infection Advised that this does not look like skin cancer She does have an appointment with her dermatologist, but not for several weeks Advised warm compresses Can try a topical antibiotic ointment She will call if there is no improvement, but I do think she needs to see dermatology for further diagnosis

## 2021-02-27 NOTE — Patient Instructions (Signed)
    Try warm compresses and antibacterial ointment.     Please call if there is no improvement in your symptoms.

## 2021-03-16 ENCOUNTER — Telehealth: Payer: Self-pay | Admitting: Emergency Medicine

## 2021-03-16 NOTE — Telephone Encounter (Signed)
Pt called Access Nurse stating she has SOB, Nasal drip and breathing problems. Access nurse reccommended ED but pt refused. Waiting on notes from phone call to come in. Thanks.

## 2021-03-16 NOTE — Telephone Encounter (Signed)
Spoke with patient. She will go to Urgent Care if breathing gets worse. She also said she would reach out to Dr. Melvyn Novas to see if he could see her as this is not new for her. Denies COVID and states her test was negative.  Will put her in for Tuesday at 340 and she will call back if she is able to be seen sooner or decides to go somewhere for evaluation.

## 2021-03-17 ENCOUNTER — Telehealth: Payer: Medicare HMO | Admitting: Physician Assistant

## 2021-03-17 ENCOUNTER — Encounter: Payer: Self-pay | Admitting: Physician Assistant

## 2021-03-17 DIAGNOSIS — J40 Bronchitis, not specified as acute or chronic: Secondary | ICD-10-CM

## 2021-03-17 MED ORDER — BENZONATATE 100 MG PO CAPS: 100.0000 mg | ORAL_CAPSULE | Freq: Three times a day (TID) | ORAL | 0 refills | Status: DC | PRN

## 2021-03-17 MED ORDER — ALBUTEROL SULFATE HFA 108 (90 BASE) MCG/ACT IN AERS: 2.0000 | INHALATION_SPRAY | Freq: Four times a day (QID) | RESPIRATORY_TRACT | 0 refills | Status: DC | PRN

## 2021-03-17 MED ORDER — METHYLPREDNISOLONE 4 MG PO TBPK: ORAL_TABLET | ORAL | 0 refills | Status: DC

## 2021-03-17 NOTE — Patient Instructions (Signed)
[name], thank you for joining Mar Daring, PA-C for today's virtual visit.  While this provider is not your primary care provider (PCP), if your PCP is located in our provider database this encounter information will be shared with them immediately following your visit.  Consent: (Patient) Emily [name] provided verbal consent for this virtual visit at the beginning of the encounter.  Current Medications:  Current Outpatient Medications:    albuterol (VENTOLIN HFA) 108 (90 Base) MCG/ACT inhaler, Inhale 2 puffs into the lungs every 6 (six) hours as needed for wheezing or shortness of breath., Disp: 8 g, Rfl: 0   benzonatate (TESSALON) 100 MG capsule, Take 1 capsule (100 mg total) by mouth 3 (three) times daily as needed., Disp: 30 capsule, Rfl: 0   methylPREDNISolone (MEDROL DOSEPAK) 4 MG TBPK tablet, 6 day taper; take as directed on package instructions, Disp: 21 tablet, Rfl: 0   atenolol (TENORMIN) 25 MG tablet, TAKE 1 TABLET BY MOUTH EVERY DAY, Disp: 90 tablet, Rfl: 1   azelastine (ASTELIN) 0.1 % nasal spray, Place 2 sprays into both nostrils daily as needed for rhinitis. Use in each nostril as directed, Disp: 30 mL, Rfl: 12   Calcium-Vitamin D-Vitamin K [id number] MG-UNT-MCG TABS, Take 1 tablet by mouth 2 (two) times daily., Disp: , Rfl:    cholecalciferol (VITAMIN D) 25 MCG (1000 UNIT) tablet, Take 1,000 Units by mouth daily., Disp: , Rfl:    levothyroxine (SYNTHROID) 75 MCG tablet, TAKE 1 TABLET BY MOUTH EVERY DAY, Disp: 90 tablet, Rfl: 1   ZYRTEC-D ALLERGY & CONGESTION 5-120 MG tablet, TAKE 1 TABLET BY MOUTH TWICE A DAY, Disp: 48 tablet, Rfl: 14   Medications ordered in this encounter:  Meds ordered this encounter  Medications   benzonatate (TESSALON) 100 MG capsule    Sig: Take 1 capsule (100 mg total) by mouth 3 (three) times daily as needed.    Dispense:  30 capsule    Refill:  0    Order Specific Question:   Supervising Provider    Answer:   MILLER,  BRIAN [[id number]]   albuterol (VENTOLIN HFA) 108 (90 Base) MCG/ACT inhaler    Sig: Inhale 2 puffs into the lungs every 6 (six) hours as needed for wheezing or shortness of breath.    Dispense:  8 g    Refill:  0    Order Specific Question:   Supervising Provider    Answer:   MILLER, BRIAN [[id number]]   methylPREDNISolone (MEDROL DOSEPAK) 4 MG TBPK tablet    Sig: 6 day taper; take as directed on package instructions    Dispense:  21 tablet    Refill:  0    Order Specific Question:   Supervising Provider    Answer:   Sabra Heck, BRIAN [id number]     *If you need refills on other medications prior to your next appointment, please contact your pharmacy*  Follow-Up: Call back or seek an in-person evaluation if the symptoms worsen or if the condition fails to improve as anticipated.  Other Instructions See below   If you have been instructed to have an in-person evaluation today at a local Urgent Care facility, please use the link below. It will take you to a list of all of our available [facility], including address, phone number and hours of operation. Please do not delay care.  [facility]  If you or a family member do not have a primary care provider, use the link below  to schedule a visit and establish care. When you choose a [facility] primary care physician or advanced practice provider, you gain a long-term partner in health. Find a Primary Care Provider  Learn more about [facility]'s in-office and virtual care options: [facility]   Acute Bronchitis, Adult  Acute bronchitis is when air tubes in the lungs (bronchi) suddenly get swollen. The condition can make it hard for you to breathe. In adults, acute bronchitis usually goes away within 2 weeks. A cough caused by bronchitis may last up to 3 weeks. Smoking, allergies, and asthma can make thecondition worse. What are the causes? This condition is caused by: Cold and flu viruses. The most common cause  of this condition is the virus that causes the common cold. Bacteria. Substances that irritate the lungs, including: Smoke from cigarettes and other types of tobacco. Dust and pollen. Fumes from chemicals, gases, or burned fuel. Other materials that pollute indoor or outdoor air. Close contact with someone who has acute bronchitis. What increases the risk? The following factors may make you more likely to develop this condition: A weak body's defense system. This is also called the immune system. Any condition that affects your lungs and breathing, such as asthma. What are the signs or symptoms? Symptoms of this condition include: A cough. Coughing up clear, yellow, or green mucus. Wheezing. Having too much mucus in your lungs (chest congestion). Shortness of breath. A fever. Chills. Body aches. A sore throat. How is this treated? Acute bronchitis may go away over time without treatment. Your doctor may recommend: Drinking more fluids. Using a device that gets medicine into your lungs (inhaler). Using a vaporizer or a humidifier. These are machines that add water or moisture to the air. This helps with coughing and poor breathing. Taking a medicine for fever. Taking a medicine that thins mucus and clears congestion. Taking a medicine that prevents or stops coughing. Follow these instructions at home: Activity Get a lot of rest. Return to your normal activities as told by your doctor. Ask your doctor what activities are safe for you. Lifestyle  Drink enough fluid to keep your pee (urine) pale yellow. Do not drink alcohol. Do not use any products that contain nicotine or tobacco, such as cigarettes, e-cigarettes, and chewing tobacco. If you need help quitting, ask your doctor. Be aware that: Your bronchitis will get worse if you smoke or breathe in other people's smoke (secondhand smoke). Your lungs will heal faster if you quit smoking.  General instructions Take  over-the-counter and prescription medicines only as told by your doctor. Use an inhaler, cool mist vaporizer, or humidifier as told by your doctor. Rinse your mouth often with salt water. To make salt water, dissolve -1 tsp (3-6 g) of salt in 1 cup (237 mL) of warm water. Take two teaspoons of honey at bedtime. This helps lessen your coughing at night. Keep all follow-up visits as told by your doctor. This is important. How is this prevented? To lower your risk of getting this condition again: Wash your hands often with soap and water. If you cannot use soap and water, use hand sanitizer. Avoid contact with people who have cold symptoms. Try not to touch your mouth, nose, or eyes with your hands. Make sure to get the flu shot every year. Contact a doctor if: Your symptoms do not get better in 2 weeks. You vomit more than once or twice. You have symptoms of loss of fluid from  your body (dehydration). These include: Dark pee. Dry skin or eyes. Increased thirst. Headaches. Confusion. Muscle cramps. Get help right away if: You cough up blood. You have chest pain. You have very bad shortness of breath. You become dehydrated. You faint or keep feeling like you are going to faint. You have a very bad headache. Your fever or chills get worse. These symptoms may be an emergency. Get help right away. Call your local emergency services (911 in the [place] Do not wait to see if the symptoms will go away. Do not drive yourself to the hospital. Summary Acute bronchitis is when air tubes in the lungs (bronchi) suddenly get swollen. In adults, acute bronchitis usually goes away within 2 weeks. Take over-the-counter and prescription medicines only as told by your doctor. Drink enough fluid to keep your pee (urine) pale yellow. Contact a doctor if your symptoms do not improve after 2 weeks of treatment. Get help right away if you cough up blood, faint, or have chest pain or shortness of  breath. This information is not intended to replace advice given to you by your health care provider. Make sure you discuss any questions you have with your healthcare provider. Document Revised: 07/05/2020 Document Reviewed: 02/26/2019 Elsevier Patient Education  [place].

## 2021-03-17 NOTE — Progress Notes (Signed)
Virtual Visit Consent   Japji Kluk, you are scheduled for a virtual visit with a [facility] provider today.     Just as with appointments in the office, your consent must be obtained to participate.  Your consent will be active for this visit and any virtual visit you may have with one of our providers in the next 365 days.     If you have a MyChart account, a copy of this consent can be sent to you electronically.  All virtual visits are billed to your insurance company just like a traditional visit in the office.    As this is a virtual visit, video technology does not allow for your provider to perform a traditional examination.  This may limit your provider's ability to fully assess your condition.  If your provider identifies any concerns that need to be evaluated in person or the need to arrange testing (such as labs, EKG, etc.), we will make arrangements to do so.     Although advances in technology are sophisticated, we cannot ensure that it will always work on either your end or our end.  If the connection with a video visit is poor, the visit may have to be switched to a telephone visit.  With either a video or telephone visit, we are not always able to ensure that we have a secure connection.     I need to obtain your verbal consent now.   Are you willing to proceed with your visit today?    Emily [name] has provided verbal consent on 03/17/2021 for a virtual visit (video or telephone).   Mar Daring, PA-C   Date: 03/17/2021 2:18 PM   Virtual Visit via Video Note   I, Mar Daring, connected with  Emily [name]  ([id number], 01-14-1941) on 03/17/21 at  2:15 PM EDT by a video-enabled telemedicine application and verified that I am speaking with the correct person using two identifiers.  Location: Patient: Virtual Visit Location Patient: Home Provider: Virtual Visit Location Provider: Home Office   I discussed the limitations of evaluation and  management by telemedicine and the availability of in person appointments. The patient expressed understanding and agreed to proceed.    History of Present Illness: Emily [name] is a 80 y.o. who identifies as a female who was assigned female at birth, and is being seen today for cough.  HPI: Cough This is a new problem. The current episode started in the past 7 days. The problem has been unchanged. The problem occurs every few minutes. The cough is Non-productive (hard to get stuff up, but has had some yellow to green mucous when she does). Associated symptoms include chest pain (tightness not pain), nasal congestion (chronic issue, stable and at baseline), postnasal drip (chronic issue and stable at baseline) and shortness of breath. Pertinent negatives include no chills, ear pain, fever, headaches, myalgias, rhinorrhea, sore throat or wheezing. The symptoms are aggravated by lying down. She has tried OTC cough suppressant for the symptoms. The treatment provided no relief.     Problems:  Patient Active Problem List   Diagnosis Date Noted   Skin abnormality 02/27/2021   Aortic atherosclerosis ([place]) 09/22/2020   Urinary urgency 03/23/2020   CKD (chronic kidney disease) stage 3, GFR 30-59 ml/min ([place]) 03/22/2020   Hyperglycemia 03/22/2020   Change in stool 11/17/2019   Right-sided chest wall pain 11/17/2019   Urinary retention 09/23/2019   SOB (shortness of breath) 01/15/2019  Upper airway cough syndrome 01/15/2019   Belching 12/14/2018   Reactive airway disease 12/05/2018   Pre-syncope 05/19/2018   DDD (degenerative disc disease), cervical 01/15/2018   Neuralgia 09/12/2017   Memory difficulties 11/06/2016   IBS (irritable bowel syndrome) 04/16/2016   Hyperlipidemia 09/10/2015   Osteopenia 09/08/2015   Palpitations    Pleural plaque without asbestos 12/03/2010   Cervicalgia 12/18/2009   DIVERTICULOSIS, COLON 03/02/2009   Lumbago 03/11/2008   Actinic keratosis 01/23/2008    CARCINOMA, BREAST, ESTROGEN RECEPTOR NEGATIVE 10/12/2007   Hypothyroidism 06/18/2007   Allergic rhinitis 06/11/2007    Allergies:  Allergies  Allergen Reactions   Allegra [Fexofenadine]    Levaquin [Levofloxacin] Swelling and Other (See Comments)    Hallucinations, swelling of throat   Nexium [Esomeprazole Magnesium]     diarrhea   Prednisone     Facial flushing   Valerian Root Other (See Comments)    Loose stools   Medications:  Current Outpatient Medications:    albuterol (VENTOLIN HFA) 108 (90 Base) MCG/ACT inhaler, Inhale 2 puffs into the lungs every 6 (six) hours as needed for wheezing or shortness of breath., Disp: 8 g, Rfl: 0   benzonatate (TESSALON) 100 MG capsule, Take 1 capsule (100 mg total) by mouth 3 (three) times daily as needed., Disp: 30 capsule, Rfl: 0   methylPREDNISolone (MEDROL DOSEPAK) 4 MG TBPK tablet, 6 day taper; take as directed on package instructions, Disp: 21 tablet, Rfl: 0   atenolol (TENORMIN) 25 MG tablet, TAKE 1 TABLET BY MOUTH EVERY DAY, Disp: 90 tablet, Rfl: 1   azelastine (ASTELIN) 0.1 % nasal spray, Place 2 sprays into both nostrils daily as needed for rhinitis. Use in each nostril as directed, Disp: 30 mL, Rfl: 12   Calcium-Vitamin D-Vitamin K [id number] MG-UNT-MCG TABS, Take 1 tablet by mouth 2 (two) times daily., Disp: , Rfl:    cholecalciferol (VITAMIN D) 25 MCG (1000 UNIT) tablet, Take 1,000 Units by mouth daily., Disp: , Rfl:    levothyroxine (SYNTHROID) 75 MCG tablet, TAKE 1 TABLET BY MOUTH EVERY DAY, Disp: 90 tablet, Rfl: 1   ZYRTEC-D ALLERGY & CONGESTION 5-120 MG tablet, TAKE 1 TABLET BY MOUTH TWICE A DAY, Disp: 48 tablet, Rfl: 14  Observations/Objective: Patient is well-developed, well-nourished in no acute distress.  Resting comfortably at home.  Head is normocephalic, atraumatic.  No labored breathing.  Speech is clear and coherent with logical content.  Patient is alert and oriented at baseline.    Assessment and Plan: 1.  Bronchitis - benzonatate (TESSALON) 100 MG capsule; Take 1 capsule (100 mg total) by mouth 3 (three) times daily as needed.  Dispense: 30 capsule; Refill: 0 - albuterol (VENTOLIN HFA) 108 (90 Base) MCG/ACT inhaler; Inhale 2 puffs into the lungs every 6 (six) hours as needed for wheezing or shortness of breath.  Dispense: 8 g; Refill: 0 - methylPREDNISolone (MEDROL DOSEPAK) 4 MG TBPK tablet; 6 day taper; take as directed on package instructions  Dispense: 21 tablet; Refill: 0 - Worsening despite OTC management.  - Will treat with Methylprednisolone, albuterol, and tessalon perles. - Push fluids.- Rest.  - Seek in person evaluation if worsening or not improving.   Follow Up Instructions: I discussed the assessment and treatment plan with the patient. The patient was provided an opportunity to ask questions and all were answered. The patient agreed with the plan and demonstrated an understanding of the instructions.  A copy of instructions were sent to the patient via MyChart.  The patient was advised  to call back or seek an in-person evaluation if the symptoms worsen or if the condition fails to improve as anticipated.  Time:  I spent 10 minutes with the patient via telehealth technology discussing the above problems/concerns.    Mar Daring, PA-C

## 2021-03-19 ENCOUNTER — Encounter: Payer: Self-pay | Admitting: Internal Medicine

## 2021-03-20 ENCOUNTER — Ambulatory Visit: Payer: Medicare HMO | Admitting: Internal Medicine

## 2021-03-20 ENCOUNTER — Other Ambulatory Visit: Payer: Self-pay | Admitting: Internal Medicine

## 2021-03-20 MED ORDER — CETIRIZINE-PSEUDOEPHEDRINE ER 5-120 MG PO TB12: 1.0000 | ORAL_TABLET | Freq: Two times a day (BID) | ORAL | 0 refills | Status: DC

## 2021-03-20 NOTE — Telephone Encounter (Signed)
Patient did virtual appointment over the weekend and was prescribed meds.  She is feeling much better today and will make appointment if she starts to feel bad again.

## 2021-03-20 NOTE — Addendum Note (Signed)
Addended by: Earnstine Regal on: 03/20/2021 03:47 PM   Modules accepted: Orders

## 2021-03-22 DIAGNOSIS — L570 Actinic keratosis: Secondary | ICD-10-CM | POA: Diagnosis not present

## 2021-03-22 DIAGNOSIS — L821 Other seborrheic keratosis: Secondary | ICD-10-CM | POA: Diagnosis not present

## 2021-03-29 ENCOUNTER — Ambulatory Visit: Payer: Medicare HMO | Admitting: Obstetrics and Gynecology

## 2021-03-29 ENCOUNTER — Other Ambulatory Visit: Payer: Self-pay

## 2021-03-29 ENCOUNTER — Other Ambulatory Visit (HOSPITAL_COMMUNITY)
Admission: RE | Admit: 2021-03-29 | Discharge: 2021-03-29 | Disposition: A | Discharge status: Home / Self Care | Payer: Medicare HMO | Source: Ambulatory Visit | Attending: Obstetrics and Gynecology | Admitting: Obstetrics and Gynecology

## 2021-03-29 ENCOUNTER — Encounter: Payer: Self-pay | Admitting: Obstetrics and Gynecology

## 2021-03-29 VITALS — BP 130/78 | HR 69 | Ht 60.0 in | Wt 122.0 lb

## 2021-03-29 DIAGNOSIS — N952 Postmenopausal atrophic vaginitis: Secondary | ICD-10-CM | POA: Diagnosis not present

## 2021-03-29 DIAGNOSIS — Z1239 Encounter for other screening for malignant neoplasm of breast: Secondary | ICD-10-CM

## 2021-03-29 DIAGNOSIS — Z124 Encounter for screening for malignant neoplasm of cervix: Secondary | ICD-10-CM | POA: Diagnosis not present

## 2021-03-29 MED ORDER — TRIAMCINOLONE ACETONIDE 0.025 % EX OINT: 1.0000 "application " | TOPICAL_OINTMENT | Freq: Two times a day (BID) | CUTANEOUS | 1 refills | Status: DC

## 2021-03-29 NOTE — Progress Notes (Signed)
GYNECOLOGY  VISIT   HPI: 80 y.o.   Married  Caucasian  female   G2P2002 with No LMP recorded. Patient is postmenopausal.   here to establish care.  Patient is complaining of vaginal dryness. She was concerned about infection earlier this year.  Noted to have some redness. She uses hydrocortisone 1% over the counter to try to treat this. She saw a PA at [facility] and was treated Mycolog II and probably Diflucan.  This improved her symptoms.   Denies vaginal discharge and odor.   She saw Dr. Wannetta Sender for urinary urgency and atrophy.  See Epic note 09/29/20. She had a gynecologic exam without abnormal finding.  She received a recommendation for coconut oil or vit E cream, the latter of which was irritating.   Hx breast cancer, not estrogen receptor positive.   Hx stage III kidney disease.   Able to take oral steroids ok. This was confirmed because her chart states she has an allergy to prednisone.   Married for 56 years.  2 daughter.  3 grandchildren.   GYNECOLOGIC HISTORY: No LMP recorded. Patient is postmenopausal. Contraception:  PMP Menopausal hormone therapy:  none Last mammogram: 02-09-21 Neg/Birads1 Last pap smear: 09/29/2018 normal per patient        OB History     Gravida  2   Para  2   Term  2   Preterm      AB      Living  2      SAB      IAB      Ectopic      Multiple      Live Births                 Patient Active Problem List   Diagnosis Date Noted   Skin abnormality 02/27/2021   Aortic atherosclerosis ([place]) 09/22/2020   Urinary urgency 03/23/2020   CKD (chronic kidney disease) stage 3, GFR 30-59 ml/min (HCC) 03/22/2020   Hyperglycemia 03/22/2020   Change in stool 11/17/2019   Right-sided chest wall pain 11/17/2019   Urinary retention 09/23/2019   SOB (shortness of breath) 01/15/2019   Upper airway cough syndrome 01/15/2019   Belching 12/14/2018   Reactive airway disease 12/05/2018   Pre-syncope 05/19/2018   DDD (degenerative  disc disease), cervical 01/15/2018   Neuralgia 09/12/2017   Memory difficulties 11/06/2016   IBS (irritable bowel syndrome) 04/16/2016   Hyperlipidemia 09/10/2015   Osteopenia 09/08/2015   Palpitations    Pleural plaque without asbestos 12/03/2010   Cervicalgia 12/18/2009   DIVERTICULOSIS, COLON 03/02/2009   Lumbago 03/11/2008   Actinic keratosis 01/23/2008   CARCINOMA, BREAST, ESTROGEN RECEPTOR NEGATIVE 10/12/2007   Hypothyroidism 06/18/2007   Allergic rhinitis 06/11/2007    Past Medical History:  Diagnosis Date   Actinic keratosis    Allergic rhinitis    BACK PAIN    CARCINOMA, BREAST, 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   L s/p lumpectomy, chemo and XRT   Cervicalgia    Cholelithiasis    DIVERTICULOSIS, COLON    ENDOMETRIOSIS    GERD (gastroesophageal reflux disease)    Heart palpitations    Hypertension    HYPOTHYROIDISM    Left wrist fracture 11/06/2016   10/2016 - fell of high stool   Personal history of chemotherapy    Personal history of radiation therapy    Pleural plaque without asbestos    CT chest 10/2010: R>L lobes, upper> lower   Squamous cell carcinoma    History of BC  Past Surgical History:  Procedure Laterality Date   BREAST LUMPECTOMY  2001   left   CATARACT EXTRACTION Bilateral 09/2014   both eyes   CHOLECYSTECTOMY  2010   ENDOMETRIAL ABLATION     EXCISION / BIOPSY BREAST / NIPPLE / DUCT Left 2001    Current Outpatient Medications  Medication Sig Dispense Refill   albuterol (VENTOLIN HFA) 108 (90 Base) MCG/ACT inhaler Inhale 2 puffs into the lungs every 6 (six) hours as needed for wheezing or shortness of breath. 8 g 0   atenolol (TENORMIN) 25 MG tablet TAKE 1 TABLET BY MOUTH EVERY DAY 90 tablet 1   azelastine (ASTELIN) 0.1 % nasal spray Place 2 sprays into both nostrils daily as needed for rhinitis. Use in each nostril as directed 30 mL 12   benzonatate (TESSALON) 100 MG capsule Take 1 capsule (100 mg total) by mouth 3 (three) times daily as  needed. 30 capsule 0   Calcium-Vitamin D-Vitamin K [id number] MG-UNT-MCG TABS Take 1 tablet by mouth 2 (two) times daily.     cetirizine-pseudoephedrine (ZYRTEC-D ALLERGY & CONGESTION) 5-120 MG tablet Take 1 tablet by mouth 2 (two) times daily. 48 tablet 0   cholecalciferol (VITAMIN D) 25 MCG (1000 UNIT) tablet Take 1,000 Units by mouth daily.     levothyroxine (SYNTHROID) 75 MCG tablet TAKE 1 TABLET BY MOUTH EVERY DAY 90 tablet 1   No current facility-administered medications for this visit.     ALLERGIES: Allegra [fexofenadine], Levaquin [levofloxacin], Nexium [esomeprazole magnesium], Prednisone, and Valerian root  Family History  Problem Relation Age of Onset   Dementia Mother    Heart disease Father    Emphysema Father        smoked and worked with asbestos   Colon cancer Neg Hx    Esophageal cancer Neg Hx    Rectal cancer Neg Hx     Social History   Socioeconomic History   Marital status: Married    Spouse name: Not on file   Number of children: 2   Years of education: Not on file   Highest education level: Not on file  Occupational History   Occupation: Retired    Comment: Chiropractor  Tobacco Use   Smoking status: Never   Smokeless tobacco: Never  Vaping Use   Vaping Use: Never used  Substance and Sexual Activity   Alcohol use: No    Comment: 1 drink every 2 months   Drug use: No   Sexual activity: Not Currently    Birth control/protection: Post-menopausal  Other Topics Concern   Not on file  Social History Narrative   Married, lives in [place] area since 2008 from [place] to be close to dtr   Social Determinants of Radio broadcast assistant Strain: Low Risk    Difficulty of Paying Living Expenses: Not hard at all  Food Insecurity: No Food Insecurity   Worried About Charity fundraiser in the Last Year: Never true   Arboriculturist in the Last Year: Never true  Transportation Needs: No Transportation Needs   Lack of Transportation (Medical): No    Lack of Transportation (Non-Medical): No  Physical Activity: Sufficiently Active   Days of Exercise per Week: 5 days   Minutes of Exercise per Session: 30 min  Stress: No Stress Concern Present   Feeling of Stress : Not at all  Social Connections: Socially Integrated   Frequency of Communication with Friends and Family: More than three times a week  Frequency of Social Gatherings with Friends and Family: More than three times a week   Attends Religious Services: More than 4 times per year   Active Member of [employer] or Organizations: Yes   Attends Archivist Meetings: 1 to 4 times per year   Marital Status: Married  Human resources officer Violence: Not on file    Review of Systems  Genitourinary:        Vaginal dryness  All other systems reviewed and are negative.  PHYSICAL EXAMINATION:    BP 130/78   Pulse 69   Ht 5' (1.524 m)   Wt 122 lb (55.3 kg)   SpO2 100%   BMI 23.83 kg/m     General appearance: alert, cooperative and appears stated age Lungs: clear to auscultation bilaterally Heart: regular rate and rhythm Breast:  scar left breast consistent with prior surgery, no dominant masses, retractions nipple discharge, or axillary adenopathy noted.  Abdomen: soft, non-tender, no masses,  no organomegaly Extremities: extremities normal, atraumatic, no cyanosis or edema Skin: Skin color, texture, turgor normal. No rashes or lesions No abnormal inguinal nodes palpated Neurologic: Grossly normal  Pelvic: External genitalia:  small slit along the labia minora.              Urethra:  normal appearing urethra with no masses, tenderness or lesions              Bartholins and Skenes: normal                 Vagina: normal appearing vagina with normal color and discharge, no lesions              Cervix: no lesions                Bimanual Exam:  Uterus:  normal size, contour, position, consistency, mobility, non-tender              Adnexa: no mass, fullness, tenderness            Chaperone was present for exam:  Estill Bamberg, CMA  ASSESSMENT  Hx left beast cancer. Status post lumpectomy, XRT and chemotherapy. 2001. Screening breast exam. Vaginal atrophy.  Cervical cancer screening.  PLAN  Yearly mammogram recommended.  Try Vaseline for external vulvar comfort.  Rx for Triamcinolone ointment to use on the vulva as well.  Avoid irritants.  Pap and reflex HR HPV. Fu prn.    An After Visit Summary was printed and given to the patient.

## 2021-03-30 LAB — CYTOLOGY - PAP: Diagnosis: NEGATIVE

## 2021-04-01 ENCOUNTER — Encounter: Payer: Self-pay | Admitting: Obstetrics and Gynecology

## 2021-04-02 ENCOUNTER — Ambulatory Visit: Payer: Medicare HMO | Admitting: Internal Medicine

## 2021-04-05 ENCOUNTER — Ambulatory Visit
Admission: EM | Admit: 2021-04-05 | Discharge: 2021-04-05 | Disposition: A | Discharge status: Home / Self Care | Payer: Medicare HMO | Attending: Emergency Medicine | Admitting: Emergency Medicine

## 2021-04-05 ENCOUNTER — Other Ambulatory Visit: Payer: Self-pay

## 2021-04-05 ENCOUNTER — Encounter: Payer: Self-pay | Admitting: Emergency Medicine

## 2021-04-05 ENCOUNTER — Ambulatory Visit: Payer: Self-pay

## 2021-04-05 ENCOUNTER — Ambulatory Visit (HOSPITAL_BASED_OUTPATIENT_CLINIC_OR_DEPARTMENT_OTHER)
Admission: RE | Admit: 2021-04-05 | Discharge: 2021-04-05 | Disposition: A | Discharge status: Home / Self Care | Payer: Medicare HMO | Source: Ambulatory Visit | Attending: Emergency Medicine | Admitting: Emergency Medicine

## 2021-04-05 DIAGNOSIS — J948 Other specified pleural conditions: Secondary | ICD-10-CM | POA: Diagnosis not present

## 2021-04-05 DIAGNOSIS — R059 Cough, unspecified: Secondary | ICD-10-CM | POA: Insufficient documentation

## 2021-04-05 DIAGNOSIS — R062 Wheezing: Secondary | ICD-10-CM | POA: Insufficient documentation

## 2021-04-05 DIAGNOSIS — R0602 Shortness of breath: Secondary | ICD-10-CM | POA: Insufficient documentation

## 2021-04-05 DIAGNOSIS — R519 Headache, unspecified: Secondary | ICD-10-CM

## 2021-04-05 DIAGNOSIS — R030 Elevated blood-pressure reading, without diagnosis of hypertension: Secondary | ICD-10-CM

## 2021-04-05 DIAGNOSIS — J22 Unspecified acute lower respiratory infection: Secondary | ICD-10-CM

## 2021-04-05 MED ORDER — DOXYCYCLINE HYCLATE 100 MG PO CAPS: 100.0000 mg | ORAL_CAPSULE | Freq: Two times a day (BID) | ORAL | 0 refills | Status: AC

## 2021-04-05 MED ORDER — PREDNISONE 20 MG PO TABS: 40.0000 mg | ORAL_TABLET | Freq: Every day | ORAL | 0 refills | Status: DC

## 2021-04-05 MED ORDER — DM-GUAIFENESIN ER 30-600 MG PO TB12: 1.0000 | ORAL_TABLET | Freq: Two times a day (BID) | ORAL | 0 refills | Status: DC

## 2021-04-05 MED ORDER — BENZONATATE 100 MG PO CAPS: 100.0000 mg | ORAL_CAPSULE | Freq: Three times a day (TID) | ORAL | 0 refills | Status: DC

## 2021-04-05 NOTE — Discharge Instructions (Addendum)
Please go for chest x-ray-I will call with your results today Begin doxycycline twice daily for 1 week Repeat prednisone course, 40 mg daily with food for 5 days-take earlier in the day if possible Tessalon every 8 hours for cough Supplement with Mucinex DM-may get over-the-counter as well Continue albuterol inhaler Continue to monitor blood pressure and follow-up for blood pressure recheck in approximately 2 weeks Rest and fluids Please go to emergency room if developing worsening headache, vision changes, difficulty speaking, one-sided weakness, chest pain or worsening shortness of breath

## 2021-04-05 NOTE — ED Triage Notes (Signed)
Last night about 130am pt started having headache and shortness of breathe.  Pt has taking tylenol with little relief.  Blood pressure has been elevated this am.  Pt is not on blood pressure medication.

## 2021-04-05 NOTE — ED Provider Notes (Signed)
[facility]    CSN: [id number] Arrival date & time: 04/05/21  0802      History   Chief Complaint Chief Complaint  Patient presents with   Headache    Increased blood pressure, SOB    HPI Emily [name] is a 80 y.o. female history of CKD stage III, hyperlipidemia, IBS, reactive airway, presenting today for evaluation of elevated blood pressure and shortness of breath.  Reports approximately 2 weeks ago had virtual visit and diagnosed with bronchitis, started on albuterol inhaler and 6-day prednisone taper, also has been using Tessalon for cough.  Symptoms were improving, but reports last night developed increased shortness of breath unrelieved by albuterol.  She also reports that she had a headache extending from upper neck/occipital region into top of head.  She did take some Tylenol this morning and reports headache has eased off.  Denies associated vision changes.  She does report some dizziness described as lightheadedness, but denies any sensation of presyncope, any off-balance or spinning sensation.  Denies any nausea or vomiting.  Checked blood pressure this morning and was noted to be approximately [id number] systolic over approximately [id number] diastolic.  Denies history of hypertension, reports blood pressure typically on the lower end.  Takes atenolol for palpitations.  Also with recent fall causing wound to left forearm, denies hitting head.  Denies any drainage from wound or worsening redness from around the area.  Mild soreness associated with this.  Denies difficulty moving or bending arm.  HPI  Past Medical History:  Diagnosis Date   Actinic keratosis    Allergic rhinitis    BACK PAIN    CARCINOMA, BREAST, 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   L s/p lumpectomy, chemo and XRT   Cervicalgia    Cholelithiasis    DIVERTICULOSIS, COLON    ENDOMETRIOSIS    GERD (gastroesophageal reflux disease)    Heart palpitations    Hypertension    HYPOTHYROIDISM    Left wrist  fracture 11/06/2016   10/2016 - fell of high stool   Personal history of chemotherapy    Personal history of radiation therapy    Pleural plaque without asbestos    CT chest 10/2010: R>L lobes, upper> lower   Squamous cell carcinoma    History of BC    Patient Active Problem List   Diagnosis Date Noted   Skin abnormality 02/27/2021   Aortic atherosclerosis ([place]) 09/22/2020   Urinary urgency 03/23/2020   CKD (chronic kidney disease) stage 3, GFR 30-59 ml/min (HCC) 03/22/2020   Hyperglycemia 03/22/2020   Change in stool 11/17/2019   Right-sided chest wall pain 11/17/2019   Urinary retention 09/23/2019   SOB (shortness of breath) 01/15/2019   Upper airway cough syndrome 01/15/2019   Belching 12/14/2018   Reactive airway disease 12/05/2018   Pre-syncope 05/19/2018   DDD (degenerative disc disease), cervical 01/15/2018   Neuralgia 09/12/2017   Memory difficulties 11/06/2016   IBS (irritable bowel syndrome) 04/16/2016   Hyperlipidemia 09/10/2015   Osteopenia 09/08/2015   Palpitations    Pleural plaque without asbestos 12/03/2010   Cervicalgia 12/18/2009   DIVERTICULOSIS, COLON 03/02/2009   Lumbago 03/11/2008   Actinic keratosis 01/23/2008   CARCINOMA, BREAST, ESTROGEN RECEPTOR NEGATIVE 10/12/2007   Hypothyroidism 06/18/2007   Allergic rhinitis 06/11/2007    Past Surgical History:  Procedure Laterality Date   BREAST LUMPECTOMY  2001   left   CATARACT EXTRACTION Bilateral 09/2014   both eyes   CHOLECYSTECTOMY  2010   ENDOMETRIAL ABLATION  EXCISION / BIOPSY BREAST / NIPPLE / DUCT Left 2001    OB History     Gravida  2   Para  2   Term  2   Preterm      AB      Living  2      SAB      IAB      Ectopic      Multiple      Live Births               Home Medications    Prior to Admission medications   Medication Sig Start Date End Date Taking? Authorizing Provider  benzonatate (TESSALON) 100 MG capsule Take 1-2 capsules (100-200 mg total) by  mouth every 8 (eight) hours. 04/05/21  Yes Iram Astorino C, PA-C  dextromethorphan-guaiFENesin (MUCINEX DM) 30-600 MG 12hr tablet Take 1 tablet by mouth 2 (two) times daily. 04/05/21  Yes Laporche Martelle C, PA-C  doxycycline (VIBRAMYCIN) 100 MG capsule Take 1 capsule (100 mg total) by mouth 2 (two) times daily for 7 days. 04/05/21 04/12/21 Yes Juaquina Machnik C, PA-C  predniSONE (DELTASONE) 20 MG tablet Take 2 tablets (40 mg total) by mouth daily with breakfast for 5 days. 04/05/21 04/10/21 Yes Quenna Doepke C, PA-C  albuterol (VENTOLIN HFA) 108 (90 Base) MCG/ACT inhaler Inhale 2 puffs into the lungs every 6 (six) hours as needed for wheezing or shortness of breath. 03/17/21   Mar Daring, PA-C  atenolol (TENORMIN) 25 MG tablet TAKE 1 TABLET BY MOUTH EVERY DAY 10/20/20   Binnie Rail, MD  azelastine (ASTELIN) 0.1 % nasal spray Place 2 sprays into both nostrils daily as needed for rhinitis. Use in each nostril as directed 03/23/20   Binnie Rail, MD  Calcium-Vitamin D-Vitamin K [id number] MG-UNT-MCG TABS Take 1 tablet by mouth 2 (two) times daily.    [provider]  cetirizine-pseudoephedrine (ZYRTEC-D ALLERGY & CONGESTION) 5-120 MG tablet Take 1 tablet by mouth 2 (two) times daily. 03/20/21   Binnie Rail, MD  cholecalciferol (VITAMIN D) 25 MCG (1000 UNIT) tablet Take 1,000 Units by mouth daily.    [provider]  levothyroxine (SYNTHROID) 75 MCG tablet TAKE 1 TABLET BY MOUTH EVERY DAY 01/24/21   Binnie Rail, MD  triamcinolone (KENALOG) 0.025 % ointment Apply 1 application topically 2 (two) times daily. Use as needed. 03/29/21   Nunzio Cobbs, MD    Family History Family History  Problem Relation Age of Onset   Dementia Mother    Heart disease Father    Emphysema Father        smoked and worked with asbestos   Colon cancer Neg Hx    Esophageal cancer Neg Hx    Rectal cancer Neg Hx     Social History Social History   Tobacco Use   Smoking status:  Never   Smokeless tobacco: Never  Vaping Use   Vaping Use: Never used  Substance Use Topics   Alcohol use: No    Comment: 1 drink every 2 months   Drug use: No     Allergies   Allegra [fexofenadine], Levaquin [levofloxacin], Nexium [esomeprazole magnesium], Prednisone, and Valerian root   Review of Systems Review of Systems  Constitutional:  Negative for fatigue and fever.  HENT:  Negative for congestion, sinus pressure and sore throat.   Eyes:  Negative for photophobia, pain and visual disturbance.  Respiratory:  Positive for cough and shortness of breath.  Cardiovascular:  Negative for chest pain.  Gastrointestinal:  Negative for abdominal pain, nausea and vomiting.  Genitourinary:  Negative for decreased urine volume and hematuria.  Musculoskeletal:  Negative for myalgias, neck pain and neck stiffness.  Skin:  Positive for wound.  Neurological:  Positive for light-headedness and headaches. Negative for dizziness, syncope, facial asymmetry, speech difficulty, weakness and numbness.    Physical Exam Triage Vital Signs ED Triage Vitals  Enc Vitals Group     BP      Pulse      Resp      Temp      Temp src      SpO2      Weight      Height      Head Circumference      Peak Flow      Pain Score      Pain Loc      Pain Edu?      Excl. in [place]?    No data found.  Updated Vital Signs BP (!) 146/90 (BP Location: Right Arm)   Pulse (!) 56   Temp (!) 97.4 F (36.3 C) (Oral)   Resp 18   Wt 120 lb (54.4 kg)   SpO2 95%   BMI 23.44 kg/m   Blood pressure rechecked and was similar around 148/87.   Visual Acuity Right Eye Distance:   Left Eye Distance:   Bilateral Distance:    Right Eye Near:   Left Eye Near:    Bilateral Near:     Physical Exam Vitals and nursing note reviewed.  Constitutional:      Appearance: She is well-developed.     Comments: No acute distress  HENT:     Head: Normocephalic and atraumatic.     Ears:     Comments: Bilateral ears  without tenderness to palpation of external auricle, tragus and mastoid, EAC's without erythema or swelling, TM's with good bony landmarks and cone of light. Non erythematous.      Nose: Nose normal.     Mouth/Throat:     Comments: Oral mucosa pink and moist, no tonsillar enlargement or exudate. Posterior pharynx patent and nonerythematous, no uvula deviation or swelling. Normal phonation.  Eyes:     Conjunctiva/sclera: Conjunctivae normal.  Cardiovascular:     Rate and Rhythm: Normal rate and regular rhythm.  Pulmonary:     Effort: Pulmonary effort is normal. No respiratory distress.     Comments: Breathing comfortably at rest, inspiratory and expiratory wheezing noted throughout bilateral lung fields anteriorly and posteriorly Abdominal:     General: There is no distension.  Musculoskeletal:        General: Normal range of motion.     Cervical back: Neck supple.     Comments: Moving left extremity appropriately at shoulder elbow and wrist.  Skin:    General: Skin is warm and dry.     Comments: Left forearm with approximately 3 cm linear wound with scabbing and mild surrounding erythema, appears dry  Neurological:     Mental Status: She is alert and oriented to person, place, and time.     UC Treatments / Results  Labs (all labs ordered are listed, but only abnormal results are displayed) Labs Reviewed - No data to display  EKG   Radiology No results found.  Procedures Procedures (including critical care time)  Medications Ordered in UC Medications - No data to display  Initial Impression / Assessment and Plan / UC Course  I  have reviewed the triage vital signs and the nursing notes.  Pertinent labs & imaging results that were available during my care of the patient were reviewed by me and considered in my medical decision making (see chart for details).    Lower respiratory infection-wheezing noted on exam, consistent with continued bronchitis, will check chest  x-ray to evaluate for further signs of pneumonia, given length of symptoms placing on doxycycline, repeat course of prednisone and continue albuterol, Tessalon and Mucinex for further relief of cough and congestion Elevated blood pressure-no history of hypertension, no red flags or warning signs, no neurodeficits, headache easing off with use of Tylenol, recommended continued monitoring for return to baseline with resolution of respiratory symptoms, recheck with PCP in approximately 2 weeks.  Discussed warning signs to go to emergency room for. Left forearm wound-appears to be healing well, no significant signs of infection, placing on doxycycline for respiratory symptoms, should also cover potential cellulitis.  Discussed strict return precautions. Patient verbalized understanding and is agreeable with plan.   Final Clinical Impressions(s) / UC Diagnoses   Final diagnoses:  Elevated blood pressure reading in office without diagnosis of hypertension  Lower respiratory infection (e.g., bronchitis, pneumonia, pneumonitis, pulmonitis)  Acute nonintractable headache, unspecified headache type     Discharge Instructions      Please go for chest x-ray-I will call with your results today Begin doxycycline twice daily for 1 week Repeat prednisone course, 40 mg daily with food for 5 days-take earlier in the day if possible Tessalon every 8 hours for cough Supplement with Mucinex DM-may get over-the-counter as well Continue albuterol inhaler Continue to monitor blood pressure and follow-up for blood pressure recheck in approximately 2 weeks Rest and fluids Please go to emergency room if developing worsening headache, vision changes, difficulty speaking, one-sided weakness, chest pain or worsening shortness of breath     ED Prescriptions     Medication Sig Dispense Auth. Provider   benzonatate (TESSALON) 100 MG capsule Take 1-2 capsules (100-200 mg total) by mouth every 8 (eight) hours. 30  capsule Reine Bristow C, PA-C   predniSONE (DELTASONE) 20 MG tablet Take 2 tablets (40 mg total) by mouth daily with breakfast for 5 days. 10 tablet Briley Bumgarner C, PA-C   doxycycline (VIBRAMYCIN) 100 MG capsule Take 1 capsule (100 mg total) by mouth 2 (two) times daily for 7 days. 14 capsule Julena Barbour C, PA-C   dextromethorphan-guaiFENesin (MUCINEX DM) 30-600 MG 12hr tablet Take 1 tablet by mouth 2 (two) times daily. 16 tablet Rakim Moone, [place] C, PA-C      PDMP not reviewed this encounter.   Janith Lima, [place] 04/05/21 [phone]

## 2021-04-08 ENCOUNTER — Other Ambulatory Visit: Payer: Self-pay | Admitting: Physician Assistant

## 2021-04-08 DIAGNOSIS — J40 Bronchitis, not specified as acute or chronic: Secondary | ICD-10-CM

## 2021-04-10 ENCOUNTER — Other Ambulatory Visit: Payer: Self-pay

## 2021-04-10 ENCOUNTER — Ambulatory Visit (INDEPENDENT_AMBULATORY_CARE_PROVIDER_SITE_OTHER): Payer: Medicare HMO

## 2021-04-10 ENCOUNTER — Encounter: Payer: Self-pay | Admitting: Internal Medicine

## 2021-04-10 ENCOUNTER — Ambulatory Visit (INDEPENDENT_AMBULATORY_CARE_PROVIDER_SITE_OTHER): Payer: Medicare HMO | Admitting: Internal Medicine

## 2021-04-10 VITALS — BP 112/78 | HR 66 | Temp 97.9°F | Ht 60.0 in | Wt 121.4 lb

## 2021-04-10 DIAGNOSIS — G4486 Cervicogenic headache: Secondary | ICD-10-CM | POA: Diagnosis not present

## 2021-04-10 DIAGNOSIS — M542 Cervicalgia: Secondary | ICD-10-CM | POA: Diagnosis not present

## 2021-04-10 DIAGNOSIS — G8929 Other chronic pain: Secondary | ICD-10-CM | POA: Diagnosis not present

## 2021-04-10 DIAGNOSIS — R030 Elevated blood-pressure reading, without diagnosis of hypertension: Secondary | ICD-10-CM | POA: Diagnosis not present

## 2021-04-10 DIAGNOSIS — J209 Acute bronchitis, unspecified: Secondary | ICD-10-CM | POA: Insufficient documentation

## 2021-04-10 MED ORDER — METHOCARBAMOL 500 MG PO TABS
500.0000 mg | ORAL_TABLET | Freq: Every evening | ORAL | 1 refills | Status: DC | PRN
Start: 2021-04-10 — End: 2021-12-14

## 2021-04-10 NOTE — Assessment & Plan Note (Signed)
acute Headache posterior head and radiates to top of head Likely msk in nature from cervical OA / muscle tightness Ct head given new onset headache Trial of methocarbamol 500 mg HS

## 2021-04-10 NOTE — Progress Notes (Signed)
Subjective:    Patient ID: Emily [name], female    DOB: 1941-08-14, 80 y.o.   MRN: [id number]  HPI The patient is here for an acute visit.  Last Thursday - had terrible headache and her BP was 155/102 - went to urgent care.  Had wheezing on exam - dx with bronchitis and started on doxy, prednisone, mucinex and tessalon, BP 146/90   Took prednisone x 1 day only - face flushing, redness  Doxy - has one day left.   Headaches at night - start posterior head and radiates up to head to forehead.  Intermittent - few times x 2 months. Pain can be severe.  Intermittent pain on head that is sharp.  She does have chronic neck issues - has seen ortho.   Scraped left forearm - mild redness, but no discharge  Medications and allergies reviewed with patient and updated if appropriate.  Patient Active Problem List   Diagnosis Date Noted   Skin abnormality 02/27/2021   Aortic atherosclerosis ([place]) 09/22/2020   Urinary urgency 03/23/2020   CKD (chronic kidney disease) stage 3, GFR 30-59 ml/min (HCC) 03/22/2020   Hyperglycemia 03/22/2020   Change in stool 11/17/2019   Right-sided chest wall pain 11/17/2019   Urinary retention 09/23/2019   SOB (shortness of breath) 01/15/2019   Upper airway cough syndrome 01/15/2019   Belching 12/14/2018   Reactive airway disease 12/05/2018   Pre-syncope 05/19/2018   DDD (degenerative disc disease), cervical 01/15/2018   Neuralgia 09/12/2017   Memory difficulties 11/06/2016   IBS (irritable bowel syndrome) 04/16/2016   Hyperlipidemia 09/10/2015   Osteopenia 09/08/2015   Palpitations    Pleural plaque without asbestos 12/03/2010   Cervicalgia 12/18/2009   DIVERTICULOSIS, COLON 03/02/2009   Lumbago 03/11/2008   Actinic keratosis 01/23/2008   CARCINOMA, BREAST, ESTROGEN RECEPTOR NEGATIVE 10/12/2007   Hypothyroidism 06/18/2007   Allergic rhinitis 06/11/2007    Current Outpatient Medications on File Prior to Visit  Medication Sig Dispense  Refill   albuterol (VENTOLIN HFA) 108 (90 Base) MCG/ACT inhaler Inhale 2 puffs into the lungs every 6 (six) hours as needed for wheezing or shortness of breath. 8 g 0   atenolol (TENORMIN) 25 MG tablet TAKE 1 TABLET BY MOUTH EVERY DAY 90 tablet 1   azelastine (ASTELIN) 0.1 % nasal spray Place 2 sprays into both nostrils daily as needed for rhinitis. Use in each nostril as directed 30 mL 12   benzonatate (TESSALON) 100 MG capsule Take 1-2 capsules (100-200 mg total) by mouth every 8 (eight) hours. 30 capsule 0   Calcium-Vitamin D-Vitamin K [id number] MG-UNT-MCG TABS Take 1 tablet by mouth 2 (two) times daily.     cetirizine-pseudoephedrine (ZYRTEC-D ALLERGY & CONGESTION) 5-120 MG tablet Take 1 tablet by mouth 2 (two) times daily. 48 tablet 0   cholecalciferol (VITAMIN D) 25 MCG (1000 UNIT) tablet Take 1,000 Units by mouth daily.     dextromethorphan-guaiFENesin (MUCINEX DM) 30-600 MG 12hr tablet Take 1 tablet by mouth 2 (two) times daily. 16 tablet 0   doxycycline (VIBRAMYCIN) 100 MG capsule Take 1 capsule (100 mg total) by mouth 2 (two) times daily for 7 days. 14 capsule 0   levothyroxine (SYNTHROID) 75 MCG tablet TAKE 1 TABLET BY MOUTH EVERY DAY 90 tablet 1   predniSONE (DELTASONE) 20 MG tablet Take 2 tablets (40 mg total) by mouth daily with breakfast for 5 days. 10 tablet 0   triamcinolone (KENALOG) 0.025 % ointment Apply 1 application topically 2 (two) times daily.  Use as needed. 30 g 1   No current facility-administered medications on file prior to visit.    Past Medical History:  Diagnosis Date   Actinic keratosis    Allergic rhinitis    BACK PAIN    CARCINOMA, BREAST, 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   L s/p lumpectomy, chemo and XRT   Cervicalgia    Cholelithiasis    DIVERTICULOSIS, COLON    ENDOMETRIOSIS    GERD (gastroesophageal reflux disease)    Heart palpitations    Hypertension    HYPOTHYROIDISM    Left wrist fracture 11/06/2016   10/2016 - fell of high stool   Personal  history of chemotherapy    Personal history of radiation therapy    Pleural plaque without asbestos    CT chest 10/2010: R>L lobes, upper> lower   Squamous cell carcinoma    History of BC    Past Surgical History:  Procedure Laterality Date   BREAST LUMPECTOMY  2001   left   CATARACT EXTRACTION Bilateral 09/2014   both eyes   CHOLECYSTECTOMY  2010   ENDOMETRIAL ABLATION     EXCISION / BIOPSY BREAST / NIPPLE / DUCT Left 2001    Social History   Socioeconomic History   Marital status: Married    Spouse name: Not on file   Number of children: 2   Years of education: Not on file   Highest education level: Not on file  Occupational History   Occupation: Retired    Comment: Chiropractor  Tobacco Use   Smoking status: Never   Smokeless tobacco: Never  Vaping Use   Vaping Use: Never used  Substance and Sexual Activity   Alcohol use: No    Comment: 1 drink every 2 months   Drug use: No   Sexual activity: Not Currently    Birth control/protection: Post-menopausal  Other Topics Concern   Not on file  Social History Narrative   Married, lives in [place] area since 2008 from [place] to be close to dtr   Social Determinants of Radio broadcast assistant Strain: Low Risk    Difficulty of Paying Living Expenses: Not hard at all  Food Insecurity: No Food Insecurity   Worried About Charity fundraiser in the Last Year: Never true   Arboriculturist in the Last Year: Never true  Transportation Needs: No Transportation Needs   Lack of Transportation (Medical): No   Lack of Transportation (Non-Medical): No  Physical Activity: Sufficiently Active   Days of Exercise per Week: 5 days   Minutes of Exercise per Session: 30 min  Stress: No Stress Concern Present   Feeling of Stress : Not at all  Social Connections: Socially Integrated   Frequency of Communication with Friends and Family: More than three times a week   Frequency of Social Gatherings with Friends and Family: More than  three times a week   Attends Religious Services: More than 4 times per year   Active Member of [employer] or Organizations: Yes   Attends Archivist Meetings: 1 to 4 times per year   Marital Status: Married    Family History  Problem Relation Age of Onset   Dementia Mother    Heart disease Father    Emphysema Father        smoked and worked with asbestos   Colon cancer Neg Hx    Esophageal cancer Neg Hx    Rectal cancer Neg Hx     Review  of Systems  Constitutional:  Positive for chills. Negative for fever.  HENT:  Positive for rhinorrhea (chronic  no change).   Respiratory:  Positive for cough (dry - clearing throat) and shortness of breath (occ). Negative for chest tightness and wheezing.   Cardiovascular:  Negative for chest pain.  Neurological:  Positive for light-headedness and headaches. Negative for dizziness and numbness.      Objective:   Vitals:   04/10/21 1452  BP: 112/78  Pulse: 66  Temp: 97.9 F (36.6 C)  SpO2: 95%   BP Readings from Last 3 Encounters:  04/10/21 112/78  04/05/21 (!) 146/90  03/29/21 130/78   Wt Readings from Last 3 Encounters:  04/10/21 121 lb 6.4 oz (55.1 kg)  04/05/21 120 lb (54.4 kg)  03/29/21 122 lb (55.3 kg)   Body mass index is 23.71 kg/m.   Physical Exam    Constitutional: Appears well-developed and well-nourished. No distress.  Head: Normocephalic and atraumatic.  Neck: Neck supple. No tracheal deviation present. No thyromegaly present.  No cervical lymphadenopathy Cardiovascular: Normal rate, regular rhythm and normal heart sounds.  No murmur heard. No carotid bruit .  No edema Pulmonary/Chest: Effort normal and breath sounds normal. No respiratory distress. No has no wheezes. No rales.  Msk: no occipital tenderness, no trapezius tenderness of c-spine tenderness Skin: Skin is warm and dry. Not diaphoretic.  Psychiatric: Normal mood and affect. Behavior is normal.       Assessment & Plan:    See Problem List  for Assessment and Plan of chronic medical problems.    This visit occurred during the SARS-CoV-2 public health emergency.  Safety protocols were in place, including screening questions prior to the visit, additional usage of staff PPE, and extensive cleaning of exam room while observing appropriate contact time as indicated for disinfecting solutions.

## 2021-04-10 NOTE — Assessment & Plan Note (Signed)
Acute BP elevated at home when she had a severe HA - likely pain induced BP is good here, but has been elevated at home -- cuff is not the same as our measurement here She will monitor her BP at home - may need to get a new cuff Monitor for now only

## 2021-04-10 NOTE — Patient Instructions (Addendum)
  Have an xray of your neck downstairs.    Medication - start methocarbamol at night for your neck pain.     A Ct of your head were ordered.   Someone from their office will call you to schedule an appointment.      Follow up in 2-3 weeks

## 2021-04-10 NOTE — Assessment & Plan Note (Addendum)
Acute Went to urgent care last week - had wheeze on exam CXR neg for acute infection Started on doxy, prednisone, mucinex, tessalon - only took one dose of prednisone Clinically improved Complete doxycycline She will let me know if her symptoms worsen again after stopping abx Albuterol prn

## 2021-04-10 NOTE — Assessment & Plan Note (Signed)
Chronic [facility] been to [facility] in [facility] past Likely OA with muscle pain Likely the cause of her posterior headaches Xray today Consider MRI of neck / referral to ortho

## 2021-04-13 ENCOUNTER — Telehealth: Payer: Self-pay | Admitting: Internal Medicine

## 2021-04-13 DIAGNOSIS — E038 Other specified hypothyroidism: Secondary | ICD-10-CM

## 2021-04-13 MED ORDER — METHYLPREDNISOLONE 4 MG PO TBPK: ORAL_TABLET | ORAL | 0 refills | Status: DC

## 2021-04-13 NOTE — Telephone Encounter (Signed)
Thyroid blood work ordered   Does she want to try a steroid taper - this may help her breathing and headaches?

## 2021-04-13 NOTE — Telephone Encounter (Signed)
Still having headaches & trouble breathing  Would like to have thyroid testing done before MRI testing is done  Callback # [phone]

## 2021-04-15 ENCOUNTER — Other Ambulatory Visit: Payer: Self-pay | Admitting: Internal Medicine

## 2021-04-16 ENCOUNTER — Other Ambulatory Visit: Payer: Self-pay

## 2021-04-16 ENCOUNTER — Other Ambulatory Visit (INDEPENDENT_AMBULATORY_CARE_PROVIDER_SITE_OTHER): Payer: Medicare HMO

## 2021-04-16 ENCOUNTER — Encounter: Payer: Self-pay | Admitting: Internal Medicine

## 2021-04-16 DIAGNOSIS — E038 Other specified hypothyroidism: Secondary | ICD-10-CM

## 2021-04-16 LAB — TSH: TSH: 11.11 u[IU]/mL — ABNORMAL HIGH (ref 0.35–5.50)

## 2021-04-16 LAB — T4, FREE: Free T4: 0.83 ng/dL (ref 0.60–1.60)

## 2021-04-16 MED ORDER — LEVOTHYROXINE SODIUM 88 MCG PO TABS: 88.0000 ug | ORAL_TABLET | Freq: Every day | ORAL | 1 refills | Status: DC

## 2021-04-26 ENCOUNTER — Encounter: Payer: Self-pay | Admitting: Podiatry

## 2021-04-26 ENCOUNTER — Ambulatory Visit (INDEPENDENT_AMBULATORY_CARE_PROVIDER_SITE_OTHER): Payer: Medicare HMO

## 2021-04-26 ENCOUNTER — Other Ambulatory Visit: Payer: Self-pay

## 2021-04-26 ENCOUNTER — Ambulatory Visit: Payer: Medicare HMO | Admitting: Podiatry

## 2021-04-26 DIAGNOSIS — M7751 Other enthesopathy of right foot: Secondary | ICD-10-CM

## 2021-04-26 DIAGNOSIS — M7671 Peroneal tendinitis, right leg: Secondary | ICD-10-CM | POA: Diagnosis not present

## 2021-04-26 DIAGNOSIS — M7750 Other enthesopathy of unspecified foot: Secondary | ICD-10-CM

## 2021-04-26 NOTE — Patient Instructions (Signed)

## 2021-04-27 NOTE — Progress Notes (Signed)
  Subjective:  Patient ID: Emily [name], female    DOB: [birth year],  MRN: [id number]  Chief Complaint  Patient presents with   Foot Pain     (np) right foot  pain near the right side    80 y.o. female presents with the above complaint. History confirmed with patient. No specific injury that she can  recall  Objective:  Physical Exam: warm, good capillary refill, no trophic changes or ulcerative lesions, normal DP and PT pulses, and normal sensory exam. Left Foot: normal exam, no swelling, tenderness, instability; ligaments intact, full range of motion of all ankle/foot joints Right Foot:  sharp pain on palpation to 5th met base and w/ resisted eversion  No images are attached to the encounter.  Radiographs: Multiple views x-ray of the right foot: no fracture, dislocation noted, there is a 5th met base small enthesophyte Assessment:   1. Peroneal tendinitis of right lower extremity      Plan:  Patient was evaluated and treated and all questions answered.  Discussed the etiology and treatment options for peroneal tendinitis including stretching, formal physical therapy with an eccentric exercises therapy plan, supportive shoegears such as a running shoe or sneaker, bracing, topical and oral medications.  We also discussed that I do not routinely perform injections in this area because of the risk of an increased damage or rupture of the tendon.  We also discussed the role of surgical treatment of this for patients who do not improve after exhausting non-surgical treatment options.  -XR reviewed with patient -Educated on stretching and icing of the affected limb. -Referral placed to physical therapy. -OTC tylenol as needed -Recommended voltaren 3-4x daily to area -Trilok  ankle  brace dispensed, WBAT in this    Return in about 6 weeks (around 06/07/2021) for re-check peroneal tendinitis.

## 2021-04-30 ENCOUNTER — Encounter: Payer: Self-pay | Admitting: Internal Medicine

## 2021-05-01 ENCOUNTER — Telehealth: Payer: Self-pay | Admitting: Podiatry

## 2021-05-01 NOTE — Telephone Encounter (Signed)
Patient called the office concerned about her referral to physical therapy. I informed her that physical therapy will reach out to her regarding her appointment , if nobody has called her back  within a week to give [place] a call back .

## 2021-05-02 ENCOUNTER — Other Ambulatory Visit: Payer: Medicare HMO

## 2021-05-08 NOTE — Progress Notes (Signed)
[facility] [place] [address] [place] Phone: [phone] Subjective:   IVilma Meckel, am serving as a scribe for Dr. Hulan Saas. This visit occurred during the SARS-CoV-2 public health emergency.  Safety protocols were in place, including screening questions prior to the visit, additional usage of staff PPE, and extensive cleaning of exam room while observing appropriate contact time as indicated for disinfecting solutions.   I'm seeing this patient by the request  of:  Burns, [name], MD  CC: Neck pain  XNA:[id number]  Channel Papandrea is a 80 y.o. female coming in with complaint of cervical spine pain that causes headaches. Patient states she is having neck and right foot pain. Pain in neck has been chronic, feels dull and thobby. There have been instances of pain going into her head. She begins PT on her right foot tomorrow.  Patient is scheduled for a head CT on Monday  Xray cervical 04/10/2021 IMPRESSION: Multilevel degenerative disc disease, moderate at C4-C5 and severe at C5-C6 and C6-C7.   Mild to moderate multilevel facet arthropathy.   Trace degenerative anterolisthesis at C3-C4.       Past Medical History:  Diagnosis Date   Actinic keratosis    Allergic rhinitis    BACK PAIN    CARCINOMA, BREAST, 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   L s/p lumpectomy, chemo and XRT   Cervicalgia    Cholelithiasis    DIVERTICULOSIS, COLON    ENDOMETRIOSIS    GERD (gastroesophageal reflux disease)    Heart palpitations    Hypertension    HYPOTHYROIDISM    Left wrist fracture 11/06/2016   10/2016 - fell of high stool   Personal history of chemotherapy    Personal history of radiation therapy    Pleural plaque without asbestos    CT chest 10/2010: R>L lobes, upper> lower   Squamous cell carcinoma    History of BC   Past Surgical History:  Procedure Laterality Date   BREAST LUMPECTOMY  2001   left   CATARACT EXTRACTION Bilateral  09/2014   both eyes   CHOLECYSTECTOMY  2010   ENDOMETRIAL ABLATION     EXCISION / BIOPSY BREAST / NIPPLE / DUCT Left 2001   Social History   Socioeconomic History   Marital status: Married    Spouse name: Not on file   Number of children: 2   Years of education: Not on file   Highest education level: Not on file  Occupational History   Occupation: Retired    Comment: Chiropractor  Tobacco Use   Smoking status: Never   Smokeless tobacco: Never  Vaping Use   Vaping Use: Never used  Substance and Sexual Activity   Alcohol use: No    Comment: 1 drink every 2 months   Drug use: No   Sexual activity: Not Currently    Birth control/protection: Post-menopausal  Other Topics Concern   Not on file  Social History Narrative   Married, lives in [place] area since 2008 from [place] to be close to dtr   Social Determinants of Radio broadcast assistant Strain: Low Risk    Difficulty of Paying Living Expenses: Not hard at all  Food Insecurity: No Food Insecurity   Worried About Charity fundraiser in the Last Year: Never true   Arboriculturist in the Last Year: Never true  Transportation Needs: No Transportation Needs   Lack of Transportation (Medical): No   Lack  of Transportation (Non-Medical): No  Physical Activity: Sufficiently Active   Days of Exercise per Week: 5 days   Minutes of Exercise per Session: 30 min  Stress: No Stress Concern Present   Feeling of Stress : Not at all  Social Connections: Socially Integrated   Frequency of Communication with Friends and Family: More than three times a week   Frequency of Social Gatherings with Friends and Family: More than three times a week   Attends Religious Services: More than 4 times per year   Active Member of [employer] or Organizations: Yes   Attends Archivist Meetings: 1 to 4 times per year   Marital Status: Married   Allergies  Allergen Reactions   Allegra [Fexofenadine]    Gabapentin    Levaquin  [Levofloxacin] Swelling and Other (See Comments)    Hallucinations, swelling of throat   Nexium [Esomeprazole Magnesium]     diarrhea   Prednisone     Facial flushing   Valerian Root Other (See Comments)    Loose stools   Family History  Problem Relation Age of Onset   Dementia Mother    Heart disease Father    Emphysema Father        smoked and worked with asbestos   Colon cancer Neg Hx    Esophageal cancer Neg Hx    Rectal cancer Neg Hx     Current Outpatient Medications (Endocrine & Metabolic):    levothyroxine (SYNTHROID) 88 MCG tablet, Take 1 tablet (88 mcg total) by mouth daily.   methylPREDNISolone (MEDROL DOSEPAK) 4 MG TBPK tablet, 24 mg PO on day 1, then decr. by 4 mg/day x5 days  Current Outpatient Medications (Cardiovascular):    atenolol (TENORMIN) 25 MG tablet, TAKE 1 TABLET BY MOUTH EVERY DAY  Current Outpatient Medications (Respiratory):    albuterol (VENTOLIN HFA) 108 (90 Base) MCG/ACT inhaler, Inhale 2 puffs into the lungs every 6 (six) hours as needed for wheezing or shortness of breath.   azelastine (ASTELIN) 0.1 % nasal spray, Place 2 sprays into both nostrils daily as needed for rhinitis. Use in each nostril as directed   benzonatate (TESSALON) 100 MG capsule, Take 1-2 capsules (100-200 mg total) by mouth every 8 (eight) hours.   cetirizine-pseudoephedrine (ZYRTEC-D ALLERGY & CONGESTION) 5-120 MG tablet, Take 1 tablet by mouth 2 (two) times daily.   dextromethorphan-guaiFENesin (MUCINEX DM) 30-600 MG 12hr tablet, Take 1 tablet by mouth 2 (two) times daily.    Current Outpatient Medications (Other):    Calcium-Vitamin D-Vitamin K [id number] MG-UNT-MCG TABS, Take 1 tablet by mouth 2 (two) times daily.   cholecalciferol (VITAMIN D) 25 MCG (1000 UNIT) tablet, Take 1,000 Units by mouth daily.   methocarbamol (ROBAXIN) 500 MG tablet, Take 1 tablet (500 mg total) by mouth at bedtime as needed for muscle spasms.   triamcinolone (KENALOG) 0.025 % ointment, Apply 1  application topically 2 (two) times daily. Use as needed.   Reviewed prior external information including notes and imaging from  primary care provider As well as notes that were available from care everywhere and other healthcare systems.  Past medical history, social, surgical and family history all reviewed in electronic medical record.  No pertanent information unless stated regarding to the chief complaint.   Review of Systems:  No  visual changes, nausea, vomiting, diarrhea, constipation, dizziness, abdominal pain, skin rash, fevers, chills, night sweats, weight loss, swollen lymph nodes, body aches, joint swelling, chest pain, shortness of breath, mood changes. POSITIVE muscle aches, headaches  Objective  Blood pressure 120/80, pulse 70, height 5' (1.524 m), weight 122 lb (55.3 kg), SpO2 97 %.   General: No apparent distress alert and oriented x3 mood and affect normal, dressed appropriately.  HEENT: Pupils equal, extraocular movements intact  Respiratory: Patient's speak in full sentences and does not appear short of breath  Cardiovascular: No lower extremity edema, non tender, no erythema  Gait normal with good balance and coordination.  MSK: Mild arthritic changes of multiple joints.  Patient does have abnormality of the chest wall no pain. Neck exam does have limited range of motion with sidebending as well as extension lacking the last 10 degrees.  Patient does have some mild increase in kyphosis of the mid thoracic back with some very mild scoliosis of the thoracic back.  Mild scapular dyskinesis likely related.  [id number]; 15 additional minutes spent for Therapeutic exercises as stated in above notes.  This included exercises focusing on stretching, strengthening, with significant focus on eccentric aspects.   Long term goals include an improvement in range of motion, strength, endurance as well as avoiding reinjury. Patient's frequency would include in 1-2 times a day, 3-5 times a  week for a duration of 6-12 weeks.  Exercises that included:  Basic scapular stabilization to include adduction and depression of scapula Scaption, focusing on proper movement and good control Internal and External rotation utilizing a theraband, with elbow tucked at side entire time Rows with theraband  Proper technique shown and discussed handout in great detail with ATC.  All questions were discussed and answered.      Impression and Recommendations:     The above documentation has been reviewed and is accurate and complete Lyndal Pulley, DO

## 2021-05-09 ENCOUNTER — Ambulatory Visit: Payer: Medicare HMO | Admitting: Family Medicine

## 2021-05-09 ENCOUNTER — Encounter: Payer: Self-pay | Admitting: Family Medicine

## 2021-05-09 ENCOUNTER — Other Ambulatory Visit: Payer: Self-pay

## 2021-05-09 DIAGNOSIS — G8929 Other chronic pain: Secondary | ICD-10-CM

## 2021-05-09 DIAGNOSIS — M542 Cervicalgia: Secondary | ICD-10-CM | POA: Diagnosis not present

## 2021-05-09 NOTE — Patient Instructions (Signed)
Great to meet you  Exercises 3 times a week.  Vitamin D 2000 iU daily  Tart cherry extract 1200mg  at night  CoQ10 100-200mg  daily for headaches as well  See me again in 6 weeks to make sure doing better

## 2021-05-09 NOTE — Assessment & Plan Note (Signed)
Secondary to degenerative disc disease.  Fairly severe overall when looking at the x-rays.  Patient is likely not having any radicular symptoms at the moment.  Patient does have some mild weakness also noted patient work with Product/process development scientist and learn home exercises.  Does have the muscle relaxer that she can take at nighttime from primary care provider if needed.  Discussed over-the-counter medications that I think will be beneficial.  Increase activity slowly and follow-up with me again in 6 weeks.  Worsening pain consider advanced imaging of the MRI of the neck for the potential for epidural steroid injections.

## 2021-05-10 ENCOUNTER — Ambulatory Visit: Payer: Medicare HMO | Admitting: Physical Therapy

## 2021-05-10 ENCOUNTER — Other Ambulatory Visit: Payer: Medicare HMO

## 2021-05-14 ENCOUNTER — Other Ambulatory Visit: Payer: Self-pay

## 2021-05-14 ENCOUNTER — Ambulatory Visit (INDEPENDENT_AMBULATORY_CARE_PROVIDER_SITE_OTHER)
Admission: RE | Admit: 2021-05-14 | Discharge: 2021-05-14 | Disposition: A | Discharge status: Home / Self Care | Payer: Medicare HMO | Source: Ambulatory Visit | Attending: Internal Medicine | Admitting: Internal Medicine

## 2021-05-14 DIAGNOSIS — G4486 Cervicogenic headache: Secondary | ICD-10-CM | POA: Diagnosis not present

## 2021-05-14 DIAGNOSIS — G8929 Other chronic pain: Secondary | ICD-10-CM | POA: Diagnosis not present

## 2021-05-14 DIAGNOSIS — R519 Headache, unspecified: Secondary | ICD-10-CM | POA: Diagnosis not present

## 2021-05-14 DIAGNOSIS — M542 Cervicalgia: Secondary | ICD-10-CM

## 2021-05-18 ENCOUNTER — Other Ambulatory Visit: Payer: Self-pay

## 2021-05-18 ENCOUNTER — Ambulatory Visit: Payer: Medicare HMO | Attending: Podiatry | Admitting: Physical Therapy

## 2021-05-18 ENCOUNTER — Encounter: Payer: Self-pay | Admitting: Physical Therapy

## 2021-05-18 DIAGNOSIS — M6281 Muscle weakness (generalized): Secondary | ICD-10-CM

## 2021-05-18 DIAGNOSIS — M25571 Pain in right ankle and joints of right foot: Secondary | ICD-10-CM

## 2021-05-18 DIAGNOSIS — R2689 Other abnormalities of gait and mobility: Secondary | ICD-10-CM | POA: Diagnosis not present

## 2021-05-18 NOTE — Therapy (Signed)
[place], [place], [address] Phone: [phone]   Fax:  [phone]  Physical Therapy Evaluation  Patient Details  Name: Emily [name] MRN: [id number] Date of Birth: September 18, 1940 Referring Provider (PT): Criselda Peaches, [place]   Encounter Date: 05/18/2021   PT End of Session - 05/18/21 1029     Visit Number 1    Number of Visits 9    Date for PT Re-Evaluation 07/13/21    PT Start Time 1030    PT Stop Time 1115    PT Time Calculation (min) 45 min    Activity Tolerance Patient tolerated treatment well    Behavior During Therapy [facility] for tasks assessed/performed             Past Medical History:  Diagnosis Date   Actinic keratosis    Allergic rhinitis    BACK PAIN    CARCINOMA, BREAST, 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   L s/p lumpectomy, chemo and XRT   Cervicalgia    Cholelithiasis    DIVERTICULOSIS, COLON    ENDOMETRIOSIS    GERD (gastroesophageal reflux disease)    Heart palpitations    Hypertension    HYPOTHYROIDISM    Left wrist fracture 11/06/2016   10/2016 - fell of high stool   Personal history of chemotherapy    Personal history of radiation therapy    Pleural plaque without asbestos    CT chest 10/2010: R>L lobes, upper> lower   Squamous cell carcinoma    History of BC    Past Surgical History:  Procedure Laterality Date   BREAST LUMPECTOMY  2001   left   CATARACT EXTRACTION Bilateral 09/2014   both eyes   CHOLECYSTECTOMY  2010   ENDOMETRIAL ABLATION     EXCISION / BIOPSY BREAST / NIPPLE / DUCT Left 2001    There were no vitals filed for this visit.    Subjective Assessment - 05/18/21 1032     Subjective pt reports R ankle pain that started about 4 weeks ago with no specific onset. she reports since onset no change in pain. She reports more pain with getting up after sitting for longer period of time. She has no pain today but it does occur on and off. she denies any  N/T or PMHx regarding the R foot/ ankle.    Limitations Sitting    How long can you sit comfortably? 30    How long can you stand comfortably? 30 min but can vary    How long can you walk comfortably? 60 min    Diagnostic tests 04/26/2021 Xray Multiple views x-ray of the right foot: no fracture, dislocation noted, there is a 5th met base small enthesophyte    Patient Stated Goals get rid of the burning.    Currently in Pain? Yes    Pain Score 0-No pain   at worst 10/10   Pain Location Foot    Pain Orientation Right    Pain Descriptors / Indicators Aching;Burning    Pain Type Chronic pain    Pain Onset More than a month ago    Pain Frequency Intermittent    Aggravating Factors  getting up after sitting for a long period of time. getting out of bed inthe AM    Pain Relieving Factors thera-gesic topical, ice,                [facility] PT Assessment - 05/18/21 0001       Assessment  Medical Diagnosis Peroneal tendinitis of right lower extremity (M76.71)    Referring Provider (PT) McDonald, Stephan Minister, DPM    Onset Date/Surgical Date --   4 weeks   Hand Dominance Right    Next MD Visit --   6 weeks   Prior Therapy no      Precautions   Precaution Comments no power walking      Restrictions   Weight Bearing Restrictions No      Balance Screen   Has the patient fallen in the past 6 months Yes    How many times? 1    Has the patient had a decrease in activity level because of a fear of falling?  No    Is the patient reluctant to leave their home because of a fear of falling?  No      Home Environment   Living Environment Private residence    Living Arrangements Spouse/significant other    Available Help at Discharge Family    Type of [place] Access Level entry    [place] Two level    Alternate Level Stairs-Number of Steps 15    Alternate Level Stairs-Rails Right   ascending   [place] - 2 wheels;Shower seat;Cane - single point      Prior Function    Level of Independence Independent with basic ADLs    Vocation Retired      Associate Professor   Overall Cognitive Status Within Functional Limits for tasks assessed      Observation/Other Assessments   Focus on Therapeutic Outcomes (FOTO)  67%   predicted 70%     Posture/Postural Control   Posture/Postural Control Postural limitations    Postural Limitations Rounded Shoulders;Forward head      ROM / Strength   AROM / PROM / Strength AROM;Strength      AROM   Overall AROM  Within functional limits for tasks performed    AROM Assessment Site Ankle      Strength   Strength Assessment Site Ankle;Hip;Knee    Right/Left Hip Right;Left    Right/Left Ankle Right;Left    Right Ankle Dorsiflexion 5/5    Right Ankle Plantar Flexion 4+/5    Right Ankle Inversion 4+/5    Right Ankle Eversion 4-/5   pain during testing   Left Ankle Dorsiflexion 5/5    Left Ankle Plantar Flexion 4+/5    Left Ankle Inversion 4+/5    Left Ankle Eversion 4+/5      Palpation   Palpation comment TTP noted on the base of the fifth metatarsal. multiple trigger points along the R peroneals      Ambulation/Gait   Ambulation/Gait Yes    Assistive device None    Gait Pattern Step-through pattern;Trendelenburg;Antalgic;Abducted- right                        Objective measurements completed on examination: See above findings.                PT Education - 05/18/21 1047     Education Details evaluation findings, POC, goals, HEP with proper form/ rationale. Anatomy of the area involved. FOTO initial assessment.    Person(s) Educated Patient    Methods Explanation;Verbal cues;Handout    Comprehension Verbalized understanding;Verbal cues required              PT Short Term Goals - 05/18/21 1138       PT SHORT TERM GOAL #  1   Title pt to be IND with initial HEP    Time 3    Period Weeks    Status New    Target Date 06/08/21               PT Long Term Goals - 05/18/21 1139        PT LONG TERM GOAL #1   Title increase gross R ankle strengtht o >/= 4+/5 to promote ankle/ foot stability.    Time 6    Period Weeks    Status New    Target Date 06/29/21      PT LONG TERM GOAL #2   Title pt to be able to sit for >/= 60 min and be able to stand and walk after with </= 2/10 max pain in the foot for improvement of condition.    Time 6    Period Weeks    Status New    Target Date 06/29/21      PT LONG TERM GOAL #3   Title pt to report </= 2/10 max pain with getting out of bed in the morning    Time 6    Period Weeks    Status New    Target Date 06/29/21      PT LONG TERM GOAL #4   Title increase FOTO score to >/= 70 to demo improvement in function    Time 6    Period Weeks    Status New    Target Date 06/29/21      PT LONG TERM GOAL #5   Title pt to be IND with all HEP to maintain and progress current LOF IND    Time 6    Period Weeks    Status New    Target Date 06/29/21                    Plan - 05/18/21 1048     Clinical Impression Statement pt is a pleasant 80 y.o F presenting to [place] with CC or R foot pain starting 4-5 weeks ago. She has functional ankle ROM and MMT revealed weakness with ankle eversion with pain noted during testing. pt is TTP located at the base of the 5th metatarsal with multiple trigger points noted along the peroneals. She demosntrates antalgic step through pattern demonstrated intermittent R foot abducted positioning. She responded well in session with MTPR and stretching. pt would benefit from physical therapy to decrease R foot pain, improve gross ankle strength, promote efficient gait pattern and return to PLOF by addressing the deficits listed.    Stability/Clinical Decision Making Stable/Uncomplicated    Clinical Decision Making Low    Rehab Potential Good    PT Frequency 1x / week    PT Duration 8 weeks    PT Treatment/Interventions ADLs/Self Care Home Management;Cryotherapy;Moist Heat;Stair training;Gait  training;Therapeutic activities;Therapeutic exercise;Balance training;Neuromuscular re-education;Manual techniques;Dry needling;Taping;Patient/family education    PT Next Visit Plan review/ update HEP PRN - STW for peroneals on R, ankle strengthening, gait training, calf stretching,    PT Home Exercise Plan [id number] - seated / standing calf stretch, seated heel slides, ankle inversion/ eversion strengthening.    Consulted and Agree with Plan of Care Patient             Patient will benefit from skilled therapeutic intervention in order to improve the following deficits and impairments:  Improper body mechanics, Increased muscle spasms, Postural dysfunction, Pain, Abnormal gait, Decreased endurance, Decreased activity tolerance  Visit  Diagnosis: Pain in right ankle and joints of right foot  Other abnormalities of gait and mobility  Muscle weakness (generalized)     Problem List Patient Active Problem List   Diagnosis Date Noted   Cervicogenic headache 04/10/2021   Acute bronchitis 04/10/2021   Elevated blood pressure reading 04/10/2021   Skin abnormality 02/27/2021   Aortic atherosclerosis ([place]) 09/22/2020   Urinary urgency 03/23/2020   CKD (chronic kidney disease) stage 3, GFR 30-59 ml/min (HCC) 03/22/2020   Hyperglycemia 03/22/2020   Change in stool 11/17/2019   Right-sided chest wall pain 11/17/2019   Urinary retention 09/23/2019   SOB (shortness of breath) 01/15/2019   Upper airway cough syndrome 01/15/2019   Belching 12/14/2018   Reactive airway disease 12/05/2018   Tear of left rotator cuff 10/05/2018   Pre-syncope 05/19/2018   DDD (degenerative disc disease), cervical 01/15/2018   Neuralgia 09/12/2017   Presbycusis of both ears 12/11/2016   Memory difficulties 11/06/2016   IBS (irritable bowel syndrome) 04/16/2016   Hyperlipidemia 09/10/2015   Osteopenia 09/08/2015   Palpitations    Pleural plaque without asbestos 12/03/2010   Chronic neck pain 12/18/2009    DIVERTICULOSIS, COLON 03/02/2009   Lumbago 03/11/2008   Actinic keratosis 01/23/2008   CARCINOMA, BREAST, ESTROGEN RECEPTOR NEGATIVE 10/12/2007   Hypothyroidism 06/18/2007   Allergic rhinitis 06/11/2007   Starr Lake PT, DPT, LAT, ATC  05/18/21  11:46 AM      [facility] [address] [place], [place], [address] Phone: [phone]   Fax:  [phone]  Name: Emily [name] MRN: [id number] Date of Birth: [birth year]

## 2021-05-19 ENCOUNTER — Telehealth: Payer: Medicare HMO | Admitting: Family

## 2021-05-19 DIAGNOSIS — U071 COVID-19: Secondary | ICD-10-CM

## 2021-05-19 MED ORDER — MOLNUPIRAVIR EUA 200MG CAPSULE: 4.0000 | ORAL_CAPSULE | Freq: Two times a day (BID) | ORAL | 0 refills | Status: DC

## 2021-05-19 NOTE — Progress Notes (Signed)
Virtual Visit Consent   Emily [name], you are scheduled for a virtual visit with a [facility] provider today.     Just as with appointments in the office, your consent must be obtained to participate.  Your consent will be active for this visit and any virtual visit you may have with one of our providers in the next 365 days.     If you have a MyChart account, a copy of this consent can be sent to you electronically.  All virtual visits are billed to your insurance company just like a traditional visit in the office.    As this is a virtual visit, video technology does not allow for your provider to perform a traditional examination.  This may limit your provider's ability to fully assess your condition.  If your provider identifies any concerns that need to be evaluated in person or the need to arrange testing (such as labs, EKG, etc.), we will make arrangements to do so.     Although advances in technology are sophisticated, we cannot ensure that it will always work on either your end or our end.  If the connection with a video visit is poor, the visit may have to be switched to a telephone visit.  With either a video or telephone visit, we are not always able to ensure that we have a secure connection.     I need to obtain your verbal consent now.   Are you willing to proceed with your visit today?    Emily [name] has provided verbal consent on 05/19/2021 for a virtual visit (video or telephone).   Evelina Dun, FNP   Date: 05/19/2021 10:04 AM   Virtual Visit via Video Note   I, Evelina Dun, connected with  Emily [name]  ([id number], [birth year]) on 05/19/21 at 10:00 AM EDT by a video-enabled telemedicine application and verified that I am speaking with the correct person using two identifiers.  Location: Patient: Virtual Visit Location Patient: Home Provider: Virtual Visit Location Provider: Home   I discussed the limitations of evaluation and management by  telemedicine and the availability of in person appointments. The patient expressed understanding and agreed to proceed.    History of Present Illness: Emily [name] is a 80 y.o. who identifies as a female who was assigned female at birth, and is being seen today for COVID. She reports her symptoms started yesterday and tested positive today.   HPI: Cough This is a new problem. The current episode started in the past 7 days. The problem has been waxing and waning. The problem occurs every few minutes. The cough is Non-productive. Associated symptoms include chills, ear congestion, ear pain, headaches, myalgias, nasal congestion, postnasal drip, rhinorrhea and a sore throat. Pertinent negatives include no fever, shortness of breath or wheezing. She has tried rest for the symptoms. The treatment provided mild relief.   Problems:  Patient Active Problem List   Diagnosis Date Noted   Cervicogenic headache 04/10/2021   Acute bronchitis 04/10/2021   Elevated blood pressure reading 04/10/2021   Skin abnormality 02/27/2021   Aortic atherosclerosis ([place]) 09/22/2020   Urinary urgency 03/23/2020   CKD (chronic kidney disease) stage 3, GFR 30-59 ml/min (HCC) 03/22/2020   Hyperglycemia 03/22/2020   Change in stool 11/17/2019   Right-sided chest wall pain 11/17/2019   Urinary retention 09/23/2019   SOB (shortness of breath) 01/15/2019   Upper airway cough syndrome 01/15/2019   Belching 12/14/2018   Reactive airway disease 12/05/2018  Tear of left rotator cuff 10/05/2018   Pre-syncope 05/19/2018   DDD (degenerative disc disease), cervical 01/15/2018   Neuralgia 09/12/2017   Presbycusis of both ears 12/11/2016   Memory difficulties 11/06/2016   IBS (irritable bowel syndrome) 04/16/2016   Hyperlipidemia 09/10/2015   Osteopenia 09/08/2015   Palpitations    Pleural plaque without asbestos 12/03/2010   Chronic neck pain 12/18/2009   DIVERTICULOSIS, COLON 03/02/2009   Lumbago 03/11/2008    Actinic keratosis 01/23/2008   CARCINOMA, BREAST, ESTROGEN RECEPTOR NEGATIVE 10/12/2007   Hypothyroidism 06/18/2007   Allergic rhinitis 06/11/2007    Allergies:  Allergies  Allergen Reactions   Allegra [Fexofenadine]    Gabapentin    Levaquin [Levofloxacin] Swelling and Other (See Comments)    Hallucinations, swelling of throat   Nexium [Esomeprazole Magnesium]     diarrhea   Prednisone     Facial flushing   Valerian Root Other (See Comments)    Loose stools   Medications:  Current Outpatient Medications:    molnupiravir EUA (LAGEVRIO) 200 mg CAPS capsule, Take 4 capsules (800 mg total) by mouth 2 (two) times daily for 5 days., Disp: 40 capsule, Rfl: 0   albuterol (VENTOLIN HFA) 108 (90 Base) MCG/ACT inhaler, Inhale 2 puffs into the lungs every 6 (six) hours as needed for wheezing or shortness of breath., Disp: 8 g, Rfl: 0   atenolol (TENORMIN) 25 MG tablet, TAKE 1 TABLET BY MOUTH EVERY DAY, Disp: 90 tablet, Rfl: 1   azelastine (ASTELIN) 0.1 % nasal spray, Place 2 sprays into both nostrils daily as needed for rhinitis. Use in each nostril as directed, Disp: 30 mL, Rfl: 12   benzonatate (TESSALON) 100 MG capsule, Take 1-2 capsules (100-200 mg total) by mouth every 8 (eight) hours., Disp: 30 capsule, Rfl: 0   Calcium-Vitamin D-Vitamin K 774-128-78 MG-UNT-MCG TABS, Take 1 tablet by mouth 2 (two) times daily., Disp: , Rfl:    chlorpheniramine (EQ CHLORTABS) 4 MG tablet, Take 4 mg by mouth 2 (two) times daily as needed for allergies., Disp: , Rfl:    cholecalciferol (VITAMIN D) 25 MCG (1000 UNIT) tablet, Take 1,000 Units by mouth daily., Disp: , Rfl:    levothyroxine (SYNTHROID) 88 MCG tablet, Take 1 tablet (88 mcg total) by mouth daily., Disp: 90 tablet, Rfl: 1   methocarbamol (ROBAXIN) 500 MG tablet, Take 1 tablet (500 mg total) by mouth at bedtime as needed for muscle spasms., Disp: 30 tablet, Rfl: 1   triamcinolone (KENALOG) 0.025 % ointment, Apply 1 application topically 2 (two) times  daily. Use as needed., Disp: 30 g, Rfl: 1  Observations/Objective: Patient is well-developed, well-nourished in no acute distress.  Resting comfortably  at home.  Head is normocephalic, atraumatic.  No labored breathing.  Speech is clear and coherent with logical content.  Patient is alert and oriented at baseline.    Assessment and Plan: 1. COVID-19 - molnupiravir EUA (LAGEVRIO) 200 mg CAPS capsule; Take 4 capsules (800 mg total) by mouth 2 (two) times daily for 5 days.  Dispense: 40 capsule; Refill: 0 COVID positive, rest, force fluids, tylenol as needed, Quarantine for at least 5 days and you are fever free, then must wear a mask out in public from day 6-76, report any worsening symptoms such as increased shortness of breath, swelling, or continued high fevers. Possible adverse effects discussed with antivirals.    Follow Up Instructions: I discussed the assessment and treatment plan with the patient. The patient was provided an opportunity to ask questions and  all were answered. The patient agreed with the plan and demonstrated an understanding of the instructions.  A copy of instructions were sent to the patient via MyChart unless otherwise noted below.     The patient was advised to call back or seek an in-person evaluation if the symptoms worsen or if the condition fails to improve as anticipated.  Time:  I spent 6 minutes with the patient via telehealth technology discussing the above problems/concerns.    Evelina Dun, FNP

## 2021-05-20 ENCOUNTER — Other Ambulatory Visit: Payer: Self-pay

## 2021-05-20 ENCOUNTER — Encounter (HOSPITAL_BASED_OUTPATIENT_CLINIC_OR_DEPARTMENT_OTHER): Payer: Self-pay

## 2021-05-20 ENCOUNTER — Emergency Department (HOSPITAL_BASED_OUTPATIENT_CLINIC_OR_DEPARTMENT_OTHER)
Admission: EM | Admit: 2021-05-20 | Discharge: 2021-05-21 | Disposition: A | Discharge status: Home / Self Care | Payer: Medicare HMO | Attending: Emergency Medicine | Admitting: Emergency Medicine

## 2021-05-20 DIAGNOSIS — N183 Chronic kidney disease, stage 3 unspecified: Secondary | ICD-10-CM | POA: Diagnosis not present

## 2021-05-20 DIAGNOSIS — Z853 Personal history of malignant neoplasm of breast: Secondary | ICD-10-CM | POA: Insufficient documentation

## 2021-05-20 DIAGNOSIS — E039 Hypothyroidism, unspecified: Secondary | ICD-10-CM | POA: Diagnosis not present

## 2021-05-20 DIAGNOSIS — R220 Localized swelling, mass and lump, head: Secondary | ICD-10-CM

## 2021-05-20 DIAGNOSIS — I129 Hypertensive chronic kidney disease with stage 1 through stage 4 chronic kidney disease, or unspecified chronic kidney disease: Secondary | ICD-10-CM | POA: Diagnosis not present

## 2021-05-20 DIAGNOSIS — J45909 Unspecified asthma, uncomplicated: Secondary | ICD-10-CM | POA: Insufficient documentation

## 2021-05-20 DIAGNOSIS — T7840XA Allergy, unspecified, initial encounter: Secondary | ICD-10-CM | POA: Diagnosis not present

## 2021-05-20 DIAGNOSIS — Z79899 Other long term (current) drug therapy: Secondary | ICD-10-CM | POA: Insufficient documentation

## 2021-05-20 NOTE — ED Triage Notes (Addendum)
Pt was dx with COVID on Friday 05/18/21 and was prescribed Lagevrio 400 mg BID. Pt has taken three doses of the medication and this evening she started to notice her throat 'closing up' and tongue swelling. Pt already had a sore throat from COVID but has noticed increased swelling of the tongue. 25 mg Benadryl taken PTA. Hx of allergic reaction to COVID vaccines.

## 2021-05-21 ENCOUNTER — Telehealth: Payer: Self-pay | Admitting: Internal Medicine

## 2021-05-21 DIAGNOSIS — T7840XA Allergy, unspecified, initial encounter: Secondary | ICD-10-CM | POA: Diagnosis not present

## 2021-05-21 MED ORDER — EPINEPHRINE 0.3 MG/0.3ML IJ SOAJ: 0.3000 mg | INTRAMUSCULAR | 0 refills | Status: AC | PRN

## 2021-05-21 MED ORDER — DEXAMETHASONE 4 MG PO TABS
10.0000 mg | ORAL_TABLET | Freq: Once | ORAL | Status: AC
  Administered 2021-05-21: 10 mg via ORAL
  Filled 2021-05-21: qty 3

## 2021-05-21 NOTE — Telephone Encounter (Signed)
[facility] FYI 05/19/2021...   Caller states she is COVID+ and tested this morning. She also states chest pressure last night, ears hurts, runny nose, headache, body aches, and pains. Denies fever, sx yesterday afternoon.   Advised to see PCP within 4 within. [phone];provided this virtual appt number

## 2021-05-21 NOTE — Discharge Instructions (Addendum)
You were seen today with some mild tongue swelling.  Continue Benadryl every 6 hours for the next 24 hours.  Discontinue taking antiviral medication.  If you develop any new or worsening symptoms, you should be reevaluated.  You will be provided with an EpiPen at discharge.  If you develop shortness of breath, wheezing, difficulty swallowing, you should administer EpiPen and call 911.

## 2021-05-21 NOTE — ED Provider Notes (Signed)
[facility] DEPT Provider Note   CSN: [id number] Arrival date & time: 05/20/21  2309     History Chief Complaint  Patient presents with   Medication Reaction    Emily [name] is a 80 y.o. female.  HPI     This is a 80 year old female with a history of breast cancer, hypertension who presents with possible medication reaction.  She was diagnosed with COVID on Friday 9/30.  She was prescribed Lagevrio and took 2 doses on Saturday and 1 on Sunday.  She was due to take a dose this evening when she noticed that she felt like her tongue was swollen and "closing up."  She took a Benadryl and believes that she has improved.  She states she had a similar reaction after her second COVID vaccination.  She was able to ultimately get a booster and took Benadryl prior to taking the booster without any ill effects.  Patient states over the last several days she has had a sore throat at baseline with painful swallowing.  She denies any shortness of breath.  No rash, nausea, vomiting.  Currently she states she feels better.  Past Medical History:  Diagnosis Date   Actinic keratosis    Allergic rhinitis    BACK PAIN    CARCINOMA, BREAST, 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   L s/p lumpectomy, chemo and XRT   Cervicalgia    Cholelithiasis    DIVERTICULOSIS, COLON    ENDOMETRIOSIS    GERD (gastroesophageal reflux disease)    Heart palpitations    Hypertension    HYPOTHYROIDISM    Left wrist fracture 11/06/2016   10/2016 - fell of high stool   Personal history of chemotherapy    Personal history of radiation therapy    Pleural plaque without asbestos    CT chest 10/2010: R>L lobes, upper> lower   Squamous cell carcinoma    History of BC    Patient Active Problem List   Diagnosis Date Noted   Cervicogenic headache 04/10/2021   Acute bronchitis 04/10/2021   Elevated blood pressure reading 04/10/2021   Skin abnormality 02/27/2021   Aortic atherosclerosis ([place])  09/22/2020   Urinary urgency 03/23/2020   CKD (chronic kidney disease) stage 3, GFR 30-59 ml/min (HCC) 03/22/2020   Hyperglycemia 03/22/2020   Change in stool 11/17/2019   Right-sided chest wall pain 11/17/2019   Urinary retention 09/23/2019   SOB (shortness of breath) 01/15/2019   Upper airway cough syndrome 01/15/2019   Belching 12/14/2018   Reactive airway disease 12/05/2018   Tear of left rotator cuff 10/05/2018   Pre-syncope 05/19/2018   DDD (degenerative disc disease), cervical 01/15/2018   Neuralgia 09/12/2017   Presbycusis of both ears 12/11/2016   Memory difficulties 11/06/2016   IBS (irritable bowel syndrome) 04/16/2016   Hyperlipidemia 09/10/2015   Osteopenia 09/08/2015   Palpitations    Pleural plaque without asbestos 12/03/2010   Chronic neck pain 12/18/2009   DIVERTICULOSIS, COLON 03/02/2009   Lumbago 03/11/2008   Actinic keratosis 01/23/2008   CARCINOMA, BREAST, ESTROGEN RECEPTOR NEGATIVE 10/12/2007   Hypothyroidism 06/18/2007   Allergic rhinitis 06/11/2007    Past Surgical History:  Procedure Laterality Date   BREAST LUMPECTOMY  2001   left   CATARACT EXTRACTION Bilateral 09/2014   both eyes   CHOLECYSTECTOMY  2010   ENDOMETRIAL ABLATION     EXCISION / BIOPSY BREAST / NIPPLE / DUCT Left 2001     OB History     Gravida  2  Para  2   Term  2   Preterm      AB      Living  2      SAB      IAB      Ectopic      Multiple      Live Births              Family History  Problem Relation Age of Onset   Dementia Mother    Heart disease Father    Emphysema Father        smoked and worked with asbestos   Colon cancer Neg Hx    Esophageal cancer Neg Hx    Rectal cancer Neg Hx     Social History   Tobacco Use   Smoking status: Never   Smokeless tobacco: Never  Vaping Use   Vaping Use: Never used  Substance Use Topics   Alcohol use: No    Comment: 1 drink every 2 months   Drug use: No    Home Medications Prior to  Admission medications   Medication Sig Start Date End Date Taking? Authorizing Provider  EPINEPHrine 0.3 mg/0.3 mL IJ SOAJ injection Inject 0.3 mg into the muscle as needed for anaphylaxis. 05/21/21  Yes Basil Buffin, Barbette Hair, MD  albuterol (VENTOLIN HFA) 108 (90 Base) MCG/ACT inhaler Inhale 2 puffs into the lungs every 6 (six) hours as needed for wheezing or shortness of breath. 03/17/21   Mar Daring, PA-C  atenolol (TENORMIN) 25 MG tablet TAKE 1 TABLET BY MOUTH EVERY DAY 04/16/21   Binnie Rail, MD  azelastine (ASTELIN) 0.1 % nasal spray Place 2 sprays into both nostrils daily as needed for rhinitis. Use in each nostril as directed 03/23/20   Binnie Rail, MD  benzonatate (TESSALON) 100 MG capsule Take 1-2 capsules (100-200 mg total) by mouth every 8 (eight) hours. 04/05/21   Wieters, Hallie C, PA-C  Calcium-Vitamin D-Vitamin K 497-026-37 MG-UNT-MCG TABS Take 1 tablet by mouth 2 (two) times daily.    [provider]  chlorpheniramine (EQ CHLORTABS) 4 MG tablet Take 4 mg by mouth 2 (two) times daily as needed for allergies.    [provider]  cholecalciferol (VITAMIN D) 25 MCG (1000 UNIT) tablet Take 1,000 Units by mouth daily.    [provider]  levothyroxine (SYNTHROID) 88 MCG tablet Take 1 tablet (88 mcg total) by mouth daily. 04/16/21   Binnie Rail, MD  methocarbamol (ROBAXIN) 500 MG tablet Take 1 tablet (500 mg total) by mouth at bedtime as needed for muscle spasms. 04/10/21   Binnie Rail, MD  molnupiravir EUA (LAGEVRIO) 200 mg CAPS capsule Take 4 capsules (800 mg total) by mouth 2 (two) times daily for 5 days. 05/19/21 05/24/21  Evelina Dun A, FNP  triamcinolone (KENALOG) 0.025 % ointment Apply 1 application topically 2 (two) times daily. Use as needed. 03/29/21   Nunzio Cobbs, MD    Allergies    Allegra [fexofenadine], Gabapentin, Levaquin [levofloxacin], Nexium [esomeprazole magnesium], Prednisone, and Valerian root  Review of Systems    Review of Systems  Constitutional:  Negative for fever.  HENT:  Positive for sore throat and trouble swallowing.   Respiratory:  Negative for cough and shortness of breath.   Cardiovascular:  Negative for leg swelling.  Gastrointestinal:  Negative for abdominal pain.  All other systems reviewed and are negative.  Physical Exam Updated Vital Signs BP 138/82   Pulse 71  Temp 97.8 F (36.6 C) (Oral)   Resp 17   Ht 1.524 m (5')   Wt 54.4 kg   SpO2 96%   BMI 23.44 kg/m   Physical Exam Vitals and nursing note reviewed.  Constitutional:      Appearance: She is well-developed. She is not ill-appearing.  HENT:     Head: Normocephalic and atraumatic.     Nose: Nose normal.     Mouth/Throat:     Mouth: Mucous membranes are moist.     Comments: No posterior oropharyngeal swelling noted, uvula midline, tongue midline slight objective swelling lateral aspect of the tongue Eyes:     Pupils: Pupils are equal, round, and reactive to light.  Cardiovascular:     Rate and Rhythm: Normal rate and regular rhythm.     Heart sounds: Normal heart sounds.  Pulmonary:     Effort: Pulmonary effort is normal. No respiratory distress.     Breath sounds: No wheezing.  Abdominal:     General: Bowel sounds are normal.     Palpations: Abdomen is soft.  Musculoskeletal:     Cervical back: Neck supple.  Skin:    General: Skin is warm and dry.     Findings: No rash.  Neurological:     Mental Status: She is alert and oriented to person, place, and time.  Psychiatric:        Mood and Affect: Mood normal.    ED Results / Procedures / Treatments   Labs (all labs ordered are listed, but only abnormal results are displayed) Labs Reviewed - No data to display  EKG None  Radiology No results found.  Procedures Procedures   Medications Ordered in ED Medications  dexamethasone (DECADRON) tablet 10 mg (has no administration in time range)    ED Course  I have reviewed the triage vital  signs and the nursing notes.  Pertinent labs & imaging results that were available during my care of the patient were reviewed by me and considered in my medical decision making (see chart for details).  Clinical Course as of 05/21/21 0047  Mon May 21, 2021  0015 Patient reports continued improvement.  Tongue appears back to baseline. [CH]    Clinical Course User Index [CH] Delvin Hedeen, Barbette Hair, MD   MDM Rules/Calculators/A&P                           Patient presents with concerns for allergic reaction to medication.  Reports sensation of tongue swelling.  She is nontoxic and vital signs are reassuring.  She currently has no signs or symptoms of anaphylaxis.  Objectively she may have some very slight lateral tongue swelling.  Posterior oropharynx is otherwise unremarkable.  She has no wheezing or rash.  Reports improvement with Benadryl which would indicate potentially an IgE mediated response and reaction.  Overall she is improved with just Benadryl.  We discussed adding a steroid to further treat her allergic reaction.  Given that the medication is the only new thing the patient is taking, recommend that she discontinue antiviral treatment.  Patient was monitored and observed in the emergency room for almost 2 hours.  She had subjective improvement.  Objectively, exam remained fairly benign.  She was given a dose of Decadron.  She previously has not tolerated prednisone.  Recommend that she continue Benadryl over the next 24 hours.  She was provided with an EpiPen and again instructed to discontinue use of the antiviral.  After history, exam, and medical workup I feel the patient has been appropriately medically screened and is safe for discharge home. Pertinent diagnoses were discussed with the patient. Patient was given return precautions.  Final Clinical Impression(s) / ED Diagnoses Final diagnoses:  Mild tongue swelling  Allergic reaction, initial encounter    Rx / DC Orders ED  Discharge Orders          Ordered    EPINEPHrine 0.3 mg/0.3 mL IJ SOAJ injection  As needed        05/21/21 0044             Dashawn Bartnick, Barbette Hair, MD 05/21/21 [phone]

## 2021-05-22 ENCOUNTER — Encounter: Payer: Self-pay | Admitting: Internal Medicine

## 2021-05-23 ENCOUNTER — Emergency Department (HOSPITAL_BASED_OUTPATIENT_CLINIC_OR_DEPARTMENT_OTHER)
Admission: EM | Admit: 2021-05-23 | Discharge: 2021-05-23 | Disposition: A | Discharge status: Home / Self Care | Payer: Medicare HMO | Attending: Emergency Medicine | Admitting: Emergency Medicine

## 2021-05-23 ENCOUNTER — Emergency Department (HOSPITAL_BASED_OUTPATIENT_CLINIC_OR_DEPARTMENT_OTHER): Payer: Medicare HMO | Admitting: Radiology

## 2021-05-23 ENCOUNTER — Other Ambulatory Visit: Payer: Self-pay

## 2021-05-23 ENCOUNTER — Encounter (HOSPITAL_BASED_OUTPATIENT_CLINIC_OR_DEPARTMENT_OTHER): Payer: Self-pay | Admitting: Emergency Medicine

## 2021-05-23 DIAGNOSIS — U071 COVID-19: Secondary | ICD-10-CM | POA: Diagnosis not present

## 2021-05-23 DIAGNOSIS — R220 Localized swelling, mass and lump, head: Secondary | ICD-10-CM | POA: Insufficient documentation

## 2021-05-23 DIAGNOSIS — R059 Cough, unspecified: Secondary | ICD-10-CM | POA: Diagnosis not present

## 2021-05-23 DIAGNOSIS — Z5321 Procedure and treatment not carried out due to patient leaving prior to being seen by health care provider: Secondary | ICD-10-CM | POA: Diagnosis not present

## 2021-05-23 MED ORDER — ACETAMINOPHEN 325 MG PO TABS
650.0000 mg | ORAL_TABLET | Freq: Once | ORAL | Status: AC | PRN
  Administered 2021-05-23: 650 mg via ORAL
  Filled 2021-05-23: qty 2

## 2021-05-23 NOTE — Telephone Encounter (Signed)
Hi Emily [name],   Dr. Quay Burow would like for you to go to an Urgent Care to be evaluated so they can listen to your lungs, order a chest xray if need be and do treatment.   With you being COVID + right now we would not be able to bring you in the office or do an x-ray at this current time.    We would like for you to go to one today and be evaluated in person.   Thanks, Angela Nevin  Last read by [name] at 12:56 PM on 05/23/2021.

## 2021-05-23 NOTE — Telephone Encounter (Signed)
Patient calling back in (see previous message below)  Please call patient [phone]

## 2021-05-23 NOTE — ED Triage Notes (Addendum)
COVID+, took an antiviral pill on Saturday and Sunday. States she felt like her tongue was swelling. She was seen here. Now reports cough with green sputum. States "I don't feel right". States her PCP sent her to ED for CXR

## 2021-05-23 NOTE — ED Provider Notes (Signed)
Patient left without being seen  1. Patient left without being seen       Deno Etienne, DO 05/23/21 2337

## 2021-05-23 NOTE — Telephone Encounter (Signed)
Probably should go to urgent care for in person eval

## 2021-05-24 MED ORDER — DOXYCYCLINE HYCLATE 100 MG PO TABS: 100.0000 mg | ORAL_TABLET | Freq: Two times a day (BID) | ORAL | 0 refills | Status: AC

## 2021-05-24 NOTE — Addendum Note (Signed)
Addended by: Binnie Rail on: 05/24/2021 07:35 PM   Modules accepted: Orders

## 2021-05-28 ENCOUNTER — Other Ambulatory Visit: Payer: Self-pay | Admitting: Internal Medicine

## 2021-06-01 ENCOUNTER — Ambulatory Visit: Payer: Medicare HMO

## 2021-06-07 DIAGNOSIS — L820 Inflamed seborrheic keratosis: Secondary | ICD-10-CM | POA: Diagnosis not present

## 2021-06-07 DIAGNOSIS — L719 Rosacea, unspecified: Secondary | ICD-10-CM | POA: Diagnosis not present

## 2021-06-07 DIAGNOSIS — Z23 Encounter for immunization: Secondary | ICD-10-CM | POA: Diagnosis not present

## 2021-06-07 DIAGNOSIS — L738 Other specified follicular disorders: Secondary | ICD-10-CM | POA: Diagnosis not present

## 2021-06-08 ENCOUNTER — Ambulatory Visit: Payer: Medicare HMO | Attending: Podiatry

## 2021-06-08 ENCOUNTER — Other Ambulatory Visit: Payer: Self-pay

## 2021-06-08 DIAGNOSIS — R2689 Other abnormalities of gait and mobility: Secondary | ICD-10-CM | POA: Diagnosis not present

## 2021-06-08 DIAGNOSIS — M25571 Pain in right ankle and joints of right foot: Secondary | ICD-10-CM | POA: Diagnosis not present

## 2021-06-08 DIAGNOSIS — M6281 Muscle weakness (generalized): Secondary | ICD-10-CM

## 2021-06-08 NOTE — Therapy (Signed)
[facility] [place] [place], [place], [address] Phone: [phone]   Fax:  [phone]  Physical Therapy Treatment  Patient Details  Name: Emily [name] MRN: [id number] Date of Birth: 03-16-1941 Referring Provider (PT): Criselda Peaches, [place]   Encounter Date: 06/08/2021   PT End of Session - 06/08/21 1101     Visit Number 2    Number of Visits 9    Date for PT Re-Evaluation 07/13/21    Authorization Type [employer] MCR    Authorization Time Period OTO v6, v10    Progress Note Due on Visit 10    PT Start Time 1045    PT Stop Time 1130    PT Time Calculation (min) 45 min    Activity Tolerance Patient tolerated treatment well    Behavior During Therapy WFL for tasks assessed/performed             Past Medical History:  Diagnosis Date   Actinic keratosis    Allergic rhinitis    BACK PAIN    CARCINOMA, BREAST, 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   L s/p lumpectomy, chemo and XRT   Cervicalgia    Cholelithiasis    DIVERTICULOSIS, COLON    ENDOMETRIOSIS    GERD (gastroesophageal reflux disease)    Heart palpitations    Hypertension    HYPOTHYROIDISM    Left wrist fracture 11/06/2016   10/2016 - fell of high stool   Personal history of chemotherapy    Personal history of radiation therapy    Pleural plaque without asbestos    CT chest 10/2010: R>L lobes, upper> lower   Squamous cell carcinoma    History of BC    Past Surgical History:  Procedure Laterality Date   BREAST LUMPECTOMY  2001   left   CATARACT EXTRACTION Bilateral 09/2014   both eyes   CHOLECYSTECTOMY  2010   ENDOMETRIAL ABLATION     EXCISION / BIOPSY BREAST / NIPPLE / DUCT Left 2001    There were no vitals filed for this visit.   Subjective Assessment - 06/08/21 1049     Subjective Pt reports she missed her first two treatment sessions due to positive COVID testing. She did not feel well enough over that time to perform her initial HEP.  She reports continued burning at the lateral aspect of her her foot.    Currently in Pain? Yes    Pain Score 4     Pain Location Foot    Pain Orientation Right;Lateral    Pain Descriptors / Indicators Burning    Pain Type Chronic pain                               [facility] Adult PT Treatment/Exercise - 06/08/21 0001       Knee/Hip Exercises: Standing   Other Standing Knee Exercises Marching with slow leg lowering on Airex Pad 2x10 BIL      Knee/Hip Exercises: Supine   Bridges Limitations 2x10 with 5-sec hold    Other Supine Knee/Hip Exercises Peroneal nerve glides x15 with 5-sec holds      Ankle Exercises: Stretches   Slant Board Stretch 2 reps;60 seconds      Ankle Exercises: Standing   Heel Raises Limitations 2x10 with RTB around rearfeet on Airex pad    Other Standing Ankle Exercises Tandem stance on Airex pad 2x30sec BIL  PT Education - 06/08/21 1058     Education Details Updated HEP    Person(s) Educated Patient    Methods Explanation;Demonstration;Handout    Comprehension Verbalized understanding;Returned demonstration              PT Short Term Goals - 05/18/21 1138       PT SHORT TERM GOAL #1   Title pt to be IND with initial HEP    Time 3    Period Weeks    Status New    Target Date 06/08/21               PT Long Term Goals - 05/18/21 1139       PT LONG TERM GOAL #1   Title increase gross R ankle strengtht o >/= 4+/5 to promote ankle/ foot stability.    Time 6    Period Weeks    Status New    Target Date 06/29/21      PT LONG TERM GOAL #2   Title pt to be able to sit for >/= 60 min and be able to stand and walk after with </= 2/10 max pain in the foot for improvement of condition.    Time 6    Period Weeks    Status New    Target Date 06/29/21      PT LONG TERM GOAL #3   Title pt to report </= 2/10 max pain with getting out of bed in the morning    Time 6    Period Weeks    Status  New    Target Date 06/29/21      PT LONG TERM GOAL #4   Title increase FOTO score to >/= 70 to demo improvement in function    Time 6    Period Weeks    Status New    Target Date 06/29/21      PT LONG TERM GOAL #5   Title pt to be IND with all HEP to maintain and progress current LOF IND    Time 6    Period Weeks    Status New    Target Date 06/29/21                   Plan - 06/08/21 1127     Clinical Impression Statement Pt responded well to all interventions today, demonstrating good form and no increase in pain with selected exercises. Of note she reports a therapeutic response to peroneal nerve glides and heel raises with a theraband around the rearfeet. She demonstrates fair baseline balance, demonstrating ability to perform tandem stance on Airex pad with little limitation.    Stability/Clinical Decision Making Stable/Uncomplicated    Clinical Decision Making Low    Rehab Potential Good    PT Frequency 1x / week    PT Duration 8 weeks    PT Treatment/Interventions ADLs/Self Care Home Management;Cryotherapy;Moist Heat;Stair training;Gait training;Therapeutic activities;Therapeutic exercise;Balance training;Neuromuscular re-education;Manual techniques;Dry needling;Taping;Patient/family education    PT Next Visit Plan review/ update HEP PRN - STW for peroneals on R, ankle strengthening, gait training, calf stretching,    PT Home Exercise Plan [id number] - seated / standing calf stretch, seated heel slides, ankle inversion/ eversion strengthening.    Consulted and Agree with Plan of Care Patient             Patient will benefit from skilled therapeutic intervention in order to improve the following deficits and impairments:  Improper body mechanics, Increased muscle spasms, Postural dysfunction, Pain,  Abnormal gait, Decreased endurance, Decreased activity tolerance  Visit Diagnosis: Pain in right ankle and joints of right foot  Other abnormalities of gait and  mobility  Muscle weakness (generalized)     Problem List Patient Active Problem List   Diagnosis Date Noted   Cervicogenic headache 04/10/2021   Acute bronchitis 04/10/2021   Elevated blood pressure reading 04/10/2021   Skin abnormality 02/27/2021   Aortic atherosclerosis ([place]) 09/22/2020   Urinary urgency 03/23/2020   CKD (chronic kidney disease) stage 3, GFR 30-59 ml/min (HCC) 03/22/2020   Hyperglycemia 03/22/2020   Change in stool 11/17/2019   Right-sided chest wall pain 11/17/2019   Urinary retention 09/23/2019   SOB (shortness of breath) 01/15/2019   Upper airway cough syndrome 01/15/2019   Belching 12/14/2018   Reactive airway disease 12/05/2018   Tear of left rotator cuff 10/05/2018   Pre-syncope 05/19/2018   DDD (degenerative disc disease), cervical 01/15/2018   Neuralgia 09/12/2017   Presbycusis of both ears 12/11/2016   Memory difficulties 11/06/2016   IBS (irritable bowel syndrome) 04/16/2016   Hyperlipidemia 09/10/2015   Osteopenia 09/08/2015   Palpitations    Pleural plaque without asbestos 12/03/2010   Chronic neck pain 12/18/2009   DIVERTICULOSIS, COLON 03/02/2009   Lumbago 03/11/2008   Actinic keratosis 01/23/2008   CARCINOMA, BREAST, ESTROGEN RECEPTOR NEGATIVE 10/12/2007   Hypothyroidism 06/18/2007   Allergic rhinitis 06/11/2007    Vanessa Mack, PT, DPT 06/08/21 11:32 AM   [facility] [address] [place], [place], [address] Phone: [phone]   Fax:  [phone]  Name: Emily [name] MRN: [id number] Date of Birth: 1941/01/15

## 2021-06-08 NOTE — Patient Instructions (Addendum)
   [id number]

## 2021-06-13 ENCOUNTER — Telehealth: Payer: Self-pay | Admitting: Podiatry

## 2021-06-13 MED ORDER — METHYLPREDNISOLONE 4 MG PO TBPK: ORAL_TABLET | ORAL | 0 refills | Status: DC

## 2021-06-13 NOTE — Addendum Note (Signed)
Addended bySherryle Lis, Caroly Purewal R on: 06/13/2021 11:58 AM   Modules accepted: Orders

## 2021-06-13 NOTE — Telephone Encounter (Signed)
Patient is having burning sensation while doing physical Therapy, patient is requesting a prescription for Prednisone to be able to get through PT.

## 2021-06-13 NOTE — Progress Notes (Deleted)
[place] Phone: [phone] Subjective:    I'm seeing this patient by the request  of:  Binnie Rail, MD  CC:   ASN:[id number]  05/09/2021 Secondary to degenerative disc disease.  Fairly severe overall when looking at the x-rays.  Patient is likely not having any radicular symptoms at the moment.  Patient does have some mild weakness also noted patient work with Product/process development scientist and learn home exercises.  Does have the muscle relaxer that she can take at nighttime from primary care provider if needed.  Discussed over-the-counter medications that I think will be beneficial.  Increase activity slowly and follow-up with me again in 6 weeks.  Worsening pain consider advanced imaging of the MRI of the neck for the potential for epidural steroid injections.  Update 06/20/2021 Emily [name] is a 80 y.o. female coming in with complaint of cervical spine pain. 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   L s/p lumpectomy, chemo and XRT   Cervicalgia    Cholelithiasis    DIVERTICULOSIS, COLON    ENDOMETRIOSIS    GERD (gastroesophageal reflux disease)    Heart palpitations    Hypertension    HYPOTHYROIDISM    Left wrist fracture 11/06/2016   10/2016 - fell of high stool   Personal history of chemotherapy    Personal history of radiation therapy    Pleural plaque without asbestos    CT chest 10/2010: R>L lobes, upper> lower   Squamous cell carcinoma    History of BC   Past Surgical History:  Procedure Laterality Date   BREAST LUMPECTOMY  2001   left   CATARACT EXTRACTION Bilateral 09/2014   both eyes   CHOLECYSTECTOMY  2010   ENDOMETRIAL ABLATION     EXCISION / BIOPSY BREAST / NIPPLE / DUCT Left 2001   Social History   Socioeconomic History   Marital status: Married    Spouse  name: Not on file   Number of children: 2   Years of education: Not on file   Highest education level: Not on file  Occupational History   Occupation: Retired    Comment: Chiropractor  Tobacco Use   Smoking status: Never   Smokeless tobacco: Never  Vaping Use   Vaping Use: Never used  Substance and Sexual Activity   Alcohol use: No    Comment: 1 drink every 2 months   Drug use: No   Sexual activity: Not Currently    Birth control/protection: Post-menopausal  Other Topics Concern   Not on file  Social History Narrative   Married, lives in [place] area since 2008 from [place] to be close to dtr   Social Determinants of Radio broadcast assistant Strain: Low Risk    Difficulty of Paying Living Expenses: Not hard at all  Food Insecurity: No Food Insecurity   Worried About Charity fundraiser in the Last Year: Never true   Arboriculturist in the Last Year: Never true  Transportation Needs: No Transportation Needs   Lack of Transportation (Medical): No   Lack of Transportation (Non-Medical): No  Physical Activity: Sufficiently Active   Days of Exercise per Week: 5 days   Minutes of Exercise per Session: 30 min  Stress: No Stress Concern  Present   Feeling of Stress : Not at all  Social Connections: Socially Integrated   Frequency of Communication with Friends and Family: More than three times a week   Frequency of Social Gatherings with Friends and Family: More than three times a week   Attends Religious Services: More than 4 times per year   Active Member of [employer] or Organizations: Yes   Attends Archivist Meetings: 1 to 4 times per year   Marital Status: Married   Allergies  Allergen Reactions   Molnupiravir Other (See Comments)    Tongue swelling   Allegra [Fexofenadine]    Gabapentin    Levaquin [Levofloxacin] Swelling and Other (See Comments)    Hallucinations, swelling of throat   Nexium [Esomeprazole Magnesium]     diarrhea   Valerian Root Other (See  Comments)    Loose stools   Family History  Problem Relation Age of Onset   Dementia Mother    Heart disease Father    Emphysema Father        smoked and worked with asbestos   Colon cancer Neg Hx    Esophageal cancer Neg Hx    Rectal cancer Neg Hx     Current Outpatient Medications (Endocrine & Metabolic):    levothyroxine (SYNTHROID) 88 MCG tablet, Take 1 tablet (88 mcg total) by mouth daily.   methylPREDNISolone (MEDROL DOSEPAK) 4 MG TBPK tablet, 6 day dose pack - take as directed  Current Outpatient Medications (Cardiovascular):    atenolol (TENORMIN) 25 MG tablet, TAKE 1 TABLET BY MOUTH EVERY DAY   EPINEPHrine 0.3 mg/0.3 mL IJ SOAJ injection, Inject 0.3 mg into the muscle as needed for anaphylaxis.  Current Outpatient Medications (Respiratory):    albuterol (VENTOLIN HFA) 108 (90 Base) MCG/ACT inhaler, Inhale 2 puffs into the lungs every 6 (six) hours as needed for wheezing or shortness of breath.   azelastine (ASTELIN) 0.1 % nasal spray, Place 2 sprays into both nostrils daily as needed for rhinitis. Use in each nostril as directed   benzonatate (TESSALON) 100 MG capsule, Take 1-2 capsules (100-200 mg total) by mouth every 8 (eight) hours.   chlorpheniramine (EQ CHLORTABS) 4 MG tablet, Take 4 mg by mouth 2 (two) times daily as needed for allergies.    Current Outpatient Medications (Other):    Calcium-Vitamin D-Vitamin K 299-371-69 MG-UNT-MCG TABS, Take 1 tablet by mouth 2 (two) times daily.   cholecalciferol (VITAMIN D) 25 MCG (1000 UNIT) tablet, Take 1,000 Units by mouth daily.   methocarbamol (ROBAXIN) 500 MG tablet, Take 1 tablet (500 mg total) by mouth at bedtime as needed for muscle spasms.   triamcinolone (KENALOG) 0.025 % ointment, Apply 1 application topically 2 (two) times daily. Use as needed.   Reviewed prior external information including notes and imaging from  primary care provider As well as notes that were available from care everywhere and other  healthcare systems.  Past medical history, social, surgical and family history all reviewed in electronic medical record.  No pertanent information unless stated regarding to the chief complaint.   Review of Systems:  No headache, visual changes, nausea, vomiting, diarrhea, constipation, dizziness, abdominal pain, skin rash, fevers, chills, night sweats, weight loss, swollen lymph nodes, body aches, joint swelling, chest pain, shortness of breath, mood changes. POSITIVE muscle aches  Objective  There were no vitals taken for this visit.   General: No apparent distress alert and oriented x3 mood and affect normal, dressed appropriately.  HEENT: Pupils equal, extraocular movements  intact  Respiratory: Patient's speak in full sentences and does not appear short of breath  Cardiovascular: No lower extremity edema, non tender, no erythema  Gait normal with good balance and coordination.  MSK:  Non tender with full range of motion and good stability and symmetric strength and tone of shoulders, elbows, wrist, hip, knee and ankles bilaterally.     Impression and Recommendations:     The above documentation has been reviewed and is accurate and complete Emily [name]

## 2021-06-15 ENCOUNTER — Other Ambulatory Visit: Payer: Self-pay

## 2021-06-15 ENCOUNTER — Ambulatory Visit: Payer: Medicare HMO | Admitting: Physical Therapy

## 2021-06-15 ENCOUNTER — Encounter: Payer: Self-pay | Admitting: Physical Therapy

## 2021-06-15 DIAGNOSIS — M6281 Muscle weakness (generalized): Secondary | ICD-10-CM

## 2021-06-15 DIAGNOSIS — M25571 Pain in right ankle and joints of right foot: Secondary | ICD-10-CM

## 2021-06-15 DIAGNOSIS — R2689 Other abnormalities of gait and mobility: Secondary | ICD-10-CM

## 2021-06-15 NOTE — Therapy (Signed)
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   L s/p lumpectomy, chemo and XRT   Cervicalgia    Cholelithiasis    DIVERTICULOSIS, COLON    ENDOMETRIOSIS    GERD (gastroesophageal reflux disease)    Heart palpitations    Hypertension    HYPOTHYROIDISM    Left wrist fracture 11/06/2016   10/2016 - fell of high stool   Personal history of chemotherapy    Personal history of radiation therapy    Pleural plaque without asbestos    CT chest 10/2010: R>L lobes, upper> lower   Squamous cell carcinoma    History of BC    Past Surgical History:  Procedure Laterality Date   BREAST LUMPECTOMY  2001   left   CATARACT EXTRACTION Bilateral 09/2014   both eyes   CHOLECYSTECTOMY  2010   ENDOMETRIAL ABLATION     EXCISION / BIOPSY BREAST / NIPPLE / DUCT Left 2001    There were no vitals filed for this visit.   Subjective Assessment - 06/15/21 1020     Subjective "when I do the exercises I was still having alot of burning in the ankle. The MD gave me a dose pak to calm down the soreness in the ankle."    Diagnostic tests 04/26/2021 Xray Multiple views x-ray of the right foot: no fracture, dislocation noted, there is a 5th met base small enthesophyte    Patient Stated Goals get rid of the burning.    Currently in Pain? Yes     Pain Score 0-No pain    Pain Orientation Left;Right    Pain Onset More than a month ago    Pain Frequency Intermittent    Aggravating Factors  prlonged walking/ standing,    Pain Relieving Factors predinsone                [facility] PT Assessment - 06/15/21 0001       Assessment   Medical Diagnosis Peroneal tendinitis of right lower extremity (M76.71)    Referring Provider (PT) McDonald, Stephan Minister, DPM                   [facility] Adult PT Treatment/Exercise:  Therapeutic Exercise: Nu-step L5 x 5 min LE only Slant board calf stretch 2 x 30  Rocker board 2 x 15 Heel raise with ball squeeze 2 x 15  Manual Therapy: Skilled palpation and monitoring of pt during TPDN IASTM along the R peroneals  Neuromuscular re-ed: N/A  Therapeutic Activity: N/A  Modalities: N/A  Self Care: N/A  Consider / progression for next session:           Trigger Point Dry Needling - 06/15/21 0001  Consent Given? Yes    Education Handout Provided Yes    Muscles Treated Lower Quadrant Peroneals    Electrical Stimulation Performed with Dry Needling Yes    E-stim with Dry Needling Details CPS 20, adjusting intermittnetly x 8 min    Peroneals Response Palpable increased muscle length;Twitch response elicited                     PT Short Term Goals - 05/18/21 1138       PT SHORT TERM GOAL #1   Title pt to be IND with initial HEP    Time 3    Period Weeks    Status New    Target Date 06/08/21               PT Long Term Goals - 05/18/21 1139       PT LONG TERM GOAL #1   Title increase gross R ankle strengtht o >/= 4+/5 to promote ankle/ foot stability.    Time 6    Period Weeks    Status New    Target Date 06/29/21      PT LONG TERM GOAL #2   Title pt to be able to sit for >/= 60 min and be able to stand and walk after with </= 2/10 max pain in the foot for improvement of condition.    Time 6    Period Weeks    Status New    Target Date  06/29/21      PT LONG TERM GOAL #3   Title pt to report </= 2/10 max pain with getting out of bed in the morning    Time 6    Period Weeks    Status New    Target Date 06/29/21      PT LONG TERM GOAL #4   Title increase FOTO score to >/= 70 to demo improvement in function    Time 6    Period Weeks    Status New    Target Date 06/29/21      PT LONG TERM GOAL #5   Title pt to be IND with all HEP to maintain and progress current LOF IND    Time 6    Period Weeks    Status New    Target Date 06/29/21                   Plan - 06/15/21 1034     Clinical Impression Statement pt arrives reporting having increase in burning sensation in the R foot with her exercises. Her MD prescribed prednisone dos pak, and she reports improvement today. educated and consent was given for TPDN focusing on the peroneals combined with E-stim followed with IASTM techniques. continued stretching and greneral ankle strengthening which she did well with. end of session she reported sorenss / burning in the foot which could be likely from the DN.    PT Treatment/Interventions ADLs/Self Care Home Management;Cryotherapy;Moist Heat;Stair training;Gait training;Therapeutic activities;Therapeutic exercise;Balance training;Neuromuscular re-education;Manual techniques;Dry needling;Taping;Patient/family education    PT Next Visit Plan review/ update HEP PRN -  Response to DN STW for peroneals on R, ankle strengthening, gait training, calf stretching,    PT Home Exercise Plan [id number] - seated / standing calf stretch, seated heel slides, ankle inversion/ eversion strengthening.    Consulted and Agree with Plan of Care Patient             Patient will benefit from skilled therapeutic intervention in order  to improve the following deficits and impairments:  Improper body mechanics, Increased muscle spasms, Postural dysfunction, Pain, Abnormal gait, Decreased endurance, Decreased activity tolerance  Visit  Diagnosis: Pain in right ankle and joints of right foot  Other abnormalities of gait and mobility  Muscle weakness (generalized)     Problem List Patient Active Problem List   Diagnosis Date Noted   Cervicogenic headache 04/10/2021   Acute bronchitis 04/10/2021   Elevated blood pressure reading 04/10/2021   Skin abnormality 02/27/2021   Aortic atherosclerosis ([place]) 09/22/2020   Urinary urgency 03/23/2020   CKD (chronic kidney disease) stage 3, GFR 30-59 ml/min (HCC) 03/22/2020   Hyperglycemia 03/22/2020   Change in stool 11/17/2019   Right-sided chest wall pain 11/17/2019   Urinary retention 09/23/2019   SOB (shortness of breath) 01/15/2019   Upper airway cough syndrome 01/15/2019   Belching 12/14/2018   Reactive airway disease 12/05/2018   Tear of left rotator cuff 10/05/2018   Pre-syncope 05/19/2018   DDD (degenerative disc disease), cervical 01/15/2018   Neuralgia 09/12/2017   Presbycusis of both ears 12/11/2016   Memory difficulties 11/06/2016   IBS (irritable bowel syndrome) 04/16/2016   Hyperlipidemia 09/10/2015   Osteopenia 09/08/2015   Palpitations    Pleural plaque without asbestos 12/03/2010   Chronic neck pain 12/18/2009   DIVERTICULOSIS, COLON 03/02/2009   Lumbago 03/11/2008   Actinic keratosis 01/23/2008   CARCINOMA, BREAST, ESTROGEN RECEPTOR NEGATIVE 10/12/2007   Hypothyroidism 06/18/2007   Allergic rhinitis 06/11/2007   Starr Lake PT, DPT, LAT, ATC  06/15/21  11:07 AM      [facility] [address] [place], [place], [address] Phone: [phone]   Fax:  [phone]  Name: Ameia Morency MRN: [id number] Date of Birth: 01-26-1941

## 2021-06-18 ENCOUNTER — Encounter: Payer: Self-pay | Admitting: Internal Medicine

## 2021-06-19 ENCOUNTER — Other Ambulatory Visit: Payer: Self-pay

## 2021-06-19 ENCOUNTER — Ambulatory Visit: Payer: Medicare HMO | Attending: Podiatry

## 2021-06-19 DIAGNOSIS — M6281 Muscle weakness (generalized): Secondary | ICD-10-CM

## 2021-06-19 DIAGNOSIS — M25571 Pain in right ankle and joints of right foot: Secondary | ICD-10-CM | POA: Diagnosis not present

## 2021-06-19 DIAGNOSIS — R2689 Other abnormalities of gait and mobility: Secondary | ICD-10-CM

## 2021-06-19 NOTE — Therapy (Addendum)
[place], [place], [address] Phone: [phone]   Fax:  [phone]  Physical Therapy Treatment / Discharge  Patient Details  Name: Emily [name] MRN: [id number] Date of Birth: [birth year] Referring Provider (PT): Criselda Peaches, [place]   Encounter Date: 06/19/2021   PT End of Session - 06/19/21 1440     Visit Number 4    Number of Visits 9    Date for PT Re-Evaluation 07/13/21    Authorization Type Aet[employer] MCR    Authorization Time Period FOTO v6, v10    Progress Note Due on Visit 10    PT Start Time 1321    PT Stop Time 1406    PT Time Calculation (min) 45 min    Activity Tolerance Patient tolerated treatment well    Behavior During Therapy WFL for tasks assessed/performed             Past Medical History:  Diagnosis Date   Actinic keratosis    Allergic rhinitis    BACK PAIN    CARCINOMA, BREAST, 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   L s/p lumpectomy, chemo and XRT   Cervicalgia    Cholelithiasis    DIVERTICULOSIS, COLON    ENDOMETRIOSIS    GERD (gastroesophageal reflux disease)    Heart palpitations    Hypertension    HYPOTHYROIDISM    Left wrist fracture 11/06/2016   10/2016 - fell of high stool   Personal history of chemotherapy    Personal history of radiation therapy    Pleural plaque without asbestos    CT chest 10/2010: R>L lobes, upper> lower   Squamous cell carcinoma    History of BC    Past Surgical History:  Procedure Laterality Date   BREAST LUMPECTOMY  2001   left   CATARACT EXTRACTION Bilateral 09/2014   both eyes   CHOLECYSTECTOMY  2010   ENDOMETRIAL ABLATION     EXCISION / BIOPSY BREAST / NIPPLE / DUCT Left 2001    There were no vitals filed for this visit.   Subjective Assessment - 06/19/21 1325     Subjective "I had a lot of pain from the DN this weekend, so much that I couldn't do my exercises. I'm back to baseline now with burning. I finish my 6 pack of  Prednisone tomorrow, it has helped. Dr. Sherryle Lis wants me back to see him, if PT does not help."    Diagnostic tests 04/26/2021 Xray Multiple views x-ray of the right foot: no fracture, dislocation noted, there is a 5th met base small enthesophyte    Patient Stated Goals get rid of the burning.    Currently in Pain? Yes    Pain Score 5     Pain Location Foot    Pain Orientation Right;Lateral    Pain Descriptors / Indicators Burning    Pain Type Chronic pain    Pain Onset More than a month ago                                        PT Education - 06/19/21 1432     Education Details updated HEP, ice vs. heat    Person(s) Educated Patient    Methods Explanation;Demonstration;Tactile cues;Verbal cues;Handout    Comprehension Verbalized understanding;Returned demonstration;Verbal cues required;Tactile cues required              PT Short  Term Goals - 05/18/21 1138       PT SHORT TERM GOAL #1   Title pt to be IND with initial HEP    Time 3    Period Weeks    Status New    Target Date 06/08/21               PT Long Term Goals - 05/18/21 1139       PT LONG TERM GOAL #1   Title increase gross R ankle strengtht o >/= 4+/5 to promote ankle/ foot stability.    Time 6    Period Weeks    Status New    Target Date 06/29/21      PT LONG TERM GOAL #2   Title pt to be able to sit for >/= 60 min and be able to stand and walk after with </= 2/10 max pain in the foot for improvement of condition.    Time 6    Period Weeks    Status New    Target Date 06/29/21      PT LONG TERM GOAL #3   Title pt to report </= 2/10 max pain with getting out of bed in the morning    Time 6    Period Weeks    Status New    Target Date 06/29/21      PT LONG TERM GOAL #4   Title increase FOTO score to >/= 70 to demo improvement in function    Time 6    Period Weeks    Status New    Target Date 06/29/21      PT LONG TERM GOAL #5   Title pt to be IND with all  HEP to maintain and progress current LOF IND    Time 6    Period Weeks    Status New    Target Date 06/29/21              [facility] Adult PT Treatment/Exercise:   Therapeutic Exercise: Nu-step L5 x 6 min UE/LE only Bridge 12x3" weight through 3 points of foot Toe Yoga: modified using L foot to hold down big toe then 2-5 Arch doming with towel roll under 1st ray and great toe  Manual Therapy: STM to peroneals, TrP release to fibularis longus  Neuromuscular re-ed: N/A   Therapeutic Activity: N/A  Gait: Pt demonstrates R lateral lean, R supination of foot, and difficulty maintaining balance  Verbal cues for push-off on R big toe for increased connection to floor and arch activation --> improved balance, midline posture, and notable weight bearing through 1st ray after arch doming  Modalities: N/A     Plan - 06/19/21 1432     Clinical Impression Statement Pt arrives with continued burning sensation in right lateral foot proximal to 5th met head. She did not tolerated TPDN very well, reporting prolonged pain over the weekend, causing inability to perform HEP. Focused today's session on gentle manual therapy, activating foot intrinsics and arch, as well as connecting R 1st ray to gait cycle 2 patient supinating on R foot with R lateral lean and decreased dynamic balance. After performing arch doming with towel tactile feedback, pt demostrated improvement in R toe-off, balance, midline posturing, and reported decrease in pain. She did report some burning sensation remained at end of session, but less than at session start.    PT Treatment/Interventions ADLs/Self Care Home Management;Cryotherapy;Moist Heat;Stair training;Gait training;Therapeutic activities;Therapeutic exercise;Balance training;Neuromuscular re-education;Manual techniques;Dry needling;Taping;Patient/family education    PT Next  Visit Plan review/ update HEP PRN -  STW for peroneals on R, ankle/foot strengthening, gait  training, calf stretching, consider SLS on foam focusing on 1st ray connection    PT Home Exercise Plan [id number] - seated / standing calf stretch, seated heel slides, ankle inversion/ eversion strengthening.    Consulted and Agree with Plan of Care Patient             Patient will benefit from skilled therapeutic intervention in order to improve the following deficits and impairments:  Improper body mechanics, Increased muscle spasms, Postural dysfunction, Pain, Abnormal gait, Decreased endurance, Decreased activity tolerance  Visit Diagnosis: Pain in right ankle and joints of right foot  Other abnormalities of gait and mobility  Muscle weakness (generalized)     Problem List Patient Active Problem List   Diagnosis Date Noted   Cervicogenic headache 04/10/2021   Acute bronchitis 04/10/2021   Elevated blood pressure reading 04/10/2021   Skin abnormality 02/27/2021   Aortic atherosclerosis ([place]) 09/22/2020   Urinary urgency 03/23/2020   CKD (chronic kidney disease) stage 3, GFR 30-59 ml/min (HCC) 03/22/2020   Hyperglycemia 03/22/2020   Change in stool 11/17/2019   Right-sided chest wall pain 11/17/2019   Urinary retention 09/23/2019   SOB (shortness of breath) 01/15/2019   Upper airway cough syndrome 01/15/2019   Belching 12/14/2018   Reactive airway disease 12/05/2018   Tear of left rotator cuff 10/05/2018   Pre-syncope 05/19/2018   DDD (degenerative disc disease), cervical 01/15/2018   Neuralgia 09/12/2017   Presbycusis of both ears 12/11/2016   Memory difficulties 11/06/2016   IBS (irritable bowel syndrome) 04/16/2016   Hyperlipidemia 09/10/2015   Osteopenia 09/08/2015   Palpitations    Pleural plaque without asbestos 12/03/2010   Chronic neck pain 12/18/2009   DIVERTICULOSIS, COLON 03/02/2009   Lumbago 03/11/2008   Actinic keratosis 01/23/2008   CARCINOMA, BREAST, ESTROGEN RECEPTOR NEGATIVE 10/12/2007   Hypothyroidism 06/18/2007   Allergic rhinitis  06/11/2007    Izell [place], PT, DPT 06/19/2021, 2:41 PM  [facility] [address] [place], [place], [address] Phone: [phone]   Fax:  [phone]  Name: Emily [name] MRN: [id number] Date of Birth: 1941/08/06       PHYSICAL THERAPY DISCHARGE SUMMARY  Visits from Start of Care: 4  Current functional level related to goals / functional outcomes: See goals   Remaining deficits: Current status unknown   Education / Equipment: HEP, theraband, posture   Patient agrees to discharge. Patient goals were not met. Patient is being discharged due to not returning since the last visit.  Kristoffer Leamon PT, DPT, LAT, ATC  07/30/21  10:30 AM

## 2021-06-20 ENCOUNTER — Ambulatory Visit: Payer: Medicare HMO | Admitting: Family Medicine

## 2021-06-26 ENCOUNTER — Telehealth: Payer: Self-pay | Admitting: *Deleted

## 2021-06-26 ENCOUNTER — Other Ambulatory Visit: Payer: Self-pay

## 2021-06-26 ENCOUNTER — Ambulatory Visit: Payer: Medicare HMO | Admitting: Podiatry

## 2021-06-26 DIAGNOSIS — M7671 Peroneal tendinitis, right leg: Secondary | ICD-10-CM | POA: Diagnosis not present

## 2021-06-26 NOTE — Telephone Encounter (Signed)
Patient is bringing the boot she received today back, toes are hanging out of it,cannot wear but will wait after her MRI to decide to get another one.She will return it on tomorrow.

## 2021-06-26 NOTE — Telephone Encounter (Signed)
Does she need a bigger one?

## 2021-06-26 NOTE — Progress Notes (Signed)
  Subjective:  Patient ID: Emily [name], female    DOB: 09/21/1940,  MRN: [id number]  Chief Complaint  Patient presents with   Tendonitis     R foot pain returned, PT not working     80 y.o. female returns for follow-up with the above complaint. History confirmed with patient.  Continues to worsen.  She has been in physical therapy for several weeks now.  The only thing that made it feel better was the methylprednisolone but once she finished this it returned and was painful.  Physical therapy actually hurts more now.  Has not been able to tolerate the ankle brace for very long either  Objective:  Physical Exam: warm, good capillary refill, no trophic changes or ulcerative lesions, normal DP and PT pulses, and normal sensory exam. Left Foot: normal exam, no swelling, tenderness, instability; ligaments intact, full range of motion of all ankle/foot joints Right Foot:  sharp pain on palpation to 5th met base and w/ resisted eversion  No images are attached to the encounter.  Radiographs: Multiple views x-ray of the right foot: no fracture, dislocation noted, there is a 5th met base small enthesophyte Assessment:   1. Peroneal tendinitis of right lower extremity      Plan:  Patient was evaluated and treated and all questions answered.  Discussed the etiology and treatment options for peroneal tendinitis including stretching, formal physical therapy with an eccentric exercises therapy plan, supportive shoegears such as a running shoe or sneaker, bracing, topical and oral medications.  We also discussed that I do not routinely perform injections in this area because of the risk of an increased damage or rupture of the tendon.  We also discussed the role of surgical treatment of this for patients who do not improve after exhausting non-surgical treatment options.  At this point she has had minimal to no improvement.  She is having anti-inflammatories physical therapy and bracing.  We  discussed further treatment including corticosteroid versus platelet rich plasma injection as well as surgical intervention.  I recommend we evaluate with an MRI for the presence of any possible tears prior to proceeding with any sort of injection or surgery.  Placed her into a short CAM boot today which was dispensed.  For now I think she can hold off on further physical therapy until she starts to improve  Return for after MRI to review, call to schedule .

## 2021-06-27 ENCOUNTER — Encounter (HOSPITAL_BASED_OUTPATIENT_CLINIC_OR_DEPARTMENT_OTHER): Payer: Self-pay | Admitting: Cardiology

## 2021-06-27 ENCOUNTER — Telehealth: Payer: Self-pay | Admitting: *Deleted

## 2021-06-27 ENCOUNTER — Encounter: Payer: Self-pay | Admitting: Internal Medicine

## 2021-06-27 ENCOUNTER — Ambulatory Visit (HOSPITAL_BASED_OUTPATIENT_CLINIC_OR_DEPARTMENT_OTHER): Payer: Medicare HMO | Admitting: Cardiology

## 2021-06-27 ENCOUNTER — Other Ambulatory Visit: Payer: Self-pay

## 2021-06-27 VITALS — BP 114/74 | HR 69 | Ht 60.0 in | Wt 122.5 lb

## 2021-06-27 DIAGNOSIS — R011 Cardiac murmur, unspecified: Secondary | ICD-10-CM | POA: Diagnosis not present

## 2021-06-27 DIAGNOSIS — R002 Palpitations: Secondary | ICD-10-CM

## 2021-06-27 DIAGNOSIS — I499 Cardiac arrhythmia, unspecified: Secondary | ICD-10-CM

## 2021-06-27 DIAGNOSIS — Z7189 Other specified counseling: Secondary | ICD-10-CM | POA: Diagnosis not present

## 2021-06-27 MED ORDER — AZELASTINE HCL 0.1 % NA SOLN: 2.0000 | Freq: Every day | NASAL | 12 refills | Status: DC | PRN

## 2021-06-27 NOTE — Progress Notes (Signed)
Cardiology Office Note:    Date:  06/27/2021   ID:  Emily [name], DOB [birth year], MRN [id number]  PCP:  Emily Rail, MD  Cardiologist:  Emily Dresser, MD PhD  Referring MD: Emily Rail, MD   CC: follow up  History of Present Illness:    Emily [name] is a 80 y.o. female with a hx of palpitations, mitral valve prolapse who is seen for follow up. I initially met her 05/20/2018 as a new consult at the request of [name], Emily Lick, MD for the evaluation and management of presyncope and palpitations.   Today: Since her last visit, she has had a few pre-syncopal episodes of deep palpitations and her head feeling "weird." This is a rare occurrence, maybe only 2 or 3 times in the past year. Typically the episodes only last a second or two.  Her breathing has been okay, only limited by acute mild sinus congestion.  Of note, when she previously received her second Covid booster, she felt her throat closing up and her tongue swelling. On 10/1 she was infected with Covid, but was found to be allergic to the antiviral medication with similar symptoms.  She denies any chest pain, or shortness of breath. No headaches, orthopnea, PND, lower extremity edema or exertional symptoms.  Past Medical History:  Diagnosis Date   Actinic keratosis    Allergic rhinitis    BACK PAIN    CARCINOMA, BREAST, 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   L s/p lumpectomy, chemo and XRT   Cervicalgia    Cholelithiasis    DIVERTICULOSIS, COLON    ENDOMETRIOSIS    GERD (gastroesophageal reflux disease)    Heart palpitations    Hypertension    HYPOTHYROIDISM    Left wrist fracture 11/06/2016   10/2016 - fell of high stool   Personal history of chemotherapy    Personal history of radiation therapy    Pleural plaque without asbestos    CT chest 10/2010: R>L lobes, upper> lower   Squamous cell carcinoma    History of BC    Past Surgical History:  Procedure Laterality Date   BREAST LUMPECTOMY  2001    left   CATARACT EXTRACTION Bilateral 09/2014   both eyes   CHOLECYSTECTOMY  2010   ENDOMETRIAL ABLATION     EXCISION / BIOPSY BREAST / NIPPLE / DUCT Left 2001    Current Medications: Current Outpatient Medications on File Prior to Visit  Medication Sig   albuterol (VENTOLIN HFA) 108 (90 Base) MCG/ACT inhaler Inhale 2 puffs into the lungs every 6 (six) hours as needed for wheezing or shortness of breath.   atenolol (TENORMIN) 25 MG tablet TAKE 1 TABLET BY MOUTH EVERY DAY   azelastine (ASTELIN) 0.1 % nasal spray Place 2 sprays into both nostrils daily as needed for rhinitis. Use in each nostril as directed   benzonatate (TESSALON) 100 MG capsule Take 1-2 capsules (100-200 mg total) by mouth every 8 (eight) hours.   Calcium-Vitamin D-Vitamin K 007-622-63 MG-UNT-MCG TABS Take 1 tablet by mouth 2 (two) times daily.   chlorpheniramine (CHLOR-TRIMETON) 4 MG tablet Take 4 mg by mouth 2 (two) times daily as needed for allergies.   cholecalciferol (VITAMIN D) 25 MCG (1000 UNIT) tablet Take 1,000 Units by mouth daily.   EPINEPHrine 0.3 mg/0.3 mL IJ SOAJ injection Inject 0.3 mg into the muscle as needed for anaphylaxis.   levothyroxine (SYNTHROID) 88 MCG tablet Take 1 tablet (88 mcg total) by mouth daily.   methocarbamol (ROBAXIN)  500 MG tablet Take 1 tablet (500 mg total) by mouth at bedtime as needed for muscle spasms.   methylPREDNISolone (MEDROL DOSEPAK) 4 MG TBPK tablet 6 day dose pack - take as directed   triamcinolone (KENALOG) 0.025 % ointment Apply 1 application topically 2 (two) times daily. Use as needed.   No current facility-administered medications on file prior to visit.     Allergies:   Molnupiravir, Allegra [fexofenadine], Gabapentin, Levaquin [levofloxacin], Nexium [esomeprazole magnesium], and Valerian root   Family History: The patient's family history includes Dementia in her mother; Emphysema in her father; Heart disease in her father. There is no history of Colon cancer,  Esophageal cancer, or Rectal cancer.  ROS:   Please see the history of present illness.   (+) Pre-syncope (+) Palpitations (+) Lightheadedness (+) Sinus congestion Additional pertinent ROS otherwise unremarkable.  EKGs/Labs/Other Studies Reviewed:    The following studies were reviewed today:  Monitor 06/16/2018 14 day Zio patch. No VT, high degree AV block or pauses. Patient had a min HR of 44 bpm, max HR of 207 bpm, and avg HR of 72 bpm. Predominant underlying rhythm was Sinus Rhythm. First Degree AV Block was present.    72 Supraventricular Tachycardia runs occurred, the run with the fastest interval lasting 6 beats with a max rate of 207 bpm, the longest lasting 16 beats with an avg rate of 107 bpm. There were 23 triggered patient events, and supraventricular tachycardia was detected within +/- 45 seconds of symptomatic patient event(s). Isolated supraventricular and ventricular ectopy beats were rare (<1.0%).   Echo 05/21/2018 - Left ventricle: The cavity size was normal. Systolic function was    normal. The estimated ejection fraction was in the range of 60%    to 65%. Wall motion was normal; there were no regional wall    motion abnormalities.  - Aortic valve: Transvalvular velocity was within the normal range.    There was no stenosis. There was mild regurgitation.  - Mitral valve: Transvalvular velocity was within the normal range.    There was no evidence for stenosis. There was trivial    regurgitation.  - Right ventricle: The cavity size was normal. Wall thickness was    normal. Systolic function was normal.  - Atrial septum: No defect or patent foramen ovale was identified.  - Tricuspid valve: There was trivial regurgitation.  - Pulmonary arteries: PA peak pressure: 19 mm Hg (S).    EKG:  EKG is personally reviewed. 06/27/2021: sinus rhythm at 69 bpm with first degree AV block, low voltage, PRWP 01/24/2020: sinus rhythm, first degree AV block, PVC/PAC, and low  voltage.  Recent Labs: 09/25/2020: ALT 15; BUN 26; Creatinine, Ser 1.06; Hemoglobin 14.7; Platelets 174.0; Potassium 4.4; Sodium 139 04/16/2021: TSH 11.11   Recent Lipid Panel    Component Value Date/Time   CHOL 204 (H) 09/25/2020 1014   TRIG 98.0 09/25/2020 1014   TRIG 78 06/09/2006 0945   HDL 56.00 09/25/2020 1014   CHOLHDL 4 09/25/2020 1014   VLDL 19.6 09/25/2020 1014   LDLCALC 128 (H) 09/25/2020 1014   LDLCALC 138 (H) 03/23/2020 1027   LDLDIRECT 134.0 11/06/2016 1159    Physical Exam:    VS:  BP 114/74 (BP Location: Right Arm, Patient Position: Sitting, Cuff Size: Normal)   Pulse 69   Ht 5' (1.524 m)   Wt 122 lb 8 oz (55.6 kg)   SpO2 97%   BMI 23.92 kg/m     Wt Readings from Last 3  Encounters:  06/27/21 122 lb 8 oz (55.6 kg)  05/23/21 120 lb (54.4 kg)  05/20/21 120 lb (54.4 kg)    GEN: Well nourished, well developed in no acute distress HEENT: Normal, moist mucous membranes NECK: No JVD CARDIAC: regular rhythm, normal S1 and S2, no rubs or gallops. 1/6 systolic murmur. VASCULAR: Radial and DP pulses 2+ bilaterally. No carotid bruits RESPIRATORY:  Clear to auscultation without rales, wheezing or rhonchi  ABDOMEN: Soft, non-tender, non-distended MUSCULOSKELETAL:  Ambulates independently SKIN: Warm and dry, no edema NEUROLOGIC:  Alert and oriented x 3. No focal neuro deficits noted. PSYCHIATRIC:  Normal affect   ASSESSMENT:    1. Palpitation   2. Irregular heart beat   3. Murmur, cardiac   4. Cardiac risk counseling   5. Counseling on health promotion and disease prevention     PLAN:    Palpitations, noted irregular heart beat:  -much improved on atenolol -sinus with occasional PAC/PVC on ECG -echo, monitor reviewed as above -no high risk features such as syncope -with infrequent and brief symptoms, unlikely that a monitor will be helpful. If symptoms increase or she has recurrent syncope, may need to discuss implantable loop recorder  Cardiac  murmur: -no high risk findings on echo  CV risk factor counseling and prevention: -recommend heart healthy/Mediterranean diet, with whole grains, fruits, vegetable, fish, lean meats, nuts, and olive oil. Limit salt. -recommend moderate walking, 3-5 times/week for 30-50 minutes each session. Aim for at least 150 minutes.week. Goal should be pace of 3 miles/hours, or walking 1.5 miles in 30 minutes -recommend avoidance of tobacco products. Avoid excess alcohol. -ASCVD risk score: over the recommended age for statin for primary prevention The 10-year ASCVD risk score (Arnett DK, et al., 2019) is: 25.6%   Values used to calculate the score:     Age: 54 years     Sex: Female     Is Non-Hispanic African American: No     Diabetic: No     Tobacco smoker: No     Systolic Blood Pressure: 594 mmHg     Is BP treated: Yes     HDL Cholesterol: 56 mg/dL     Total Cholesterol: 204 mg/dL   Plan for follow up: 1 year or sooner PRN  Emily Dresser, MD, PhD [place]  [facility]   Medication Adjustments/Labs and Tests Ordered: Current medicines are reviewed at length with the patient today.  Concerns regarding medicines are outlined above.   Orders Placed This Encounter  Procedures   EKG 12-Lead    No orders of the defined types were placed in this encounter.  Patient Instructions  Medication Instructions:  Your Physician recommend you continue on your current medication as directed.    *If you need a refill on your cardiac medications before your next appointment, please call your pharmacy*   Lab Work: None ordered today   Testing/Procedures: None ordered today   Follow-Up: At [facility], you and your health needs are our priority.  As part of our continuing mission to provide you with exceptional heart care, we have created designated Provider Care Teams.  These Care Teams include your primary Cardiologist (physician) and Advanced Practice Providers (APPs -   Physician Assistants and Nurse Practitioners) who all work together to provide you with the care you need, when you need it.  We recommend signing up for the patient portal called "MyChart".  Sign up information is provided on this After Visit Summary.  MyChart is used to connect  with patients for Virtual Visits (Telemedicine).  Patients are able to view lab/test results, encounter notes, upcoming appointments, etc.  Non-urgent messages can be sent to your provider as well.   To learn more about what you can do with MyChart, go to [email or web address].    Your next appointment:   1 year(s)  The format for your next appointment:   In Person  Provider:   Buford Dresser, MD{   Other Instructions If you have more frequent episodes of hard beats or you lose consciousness again, please let me know. We can discuss an implantable loop recorder to try to capture these episodes.   I,Mathew Stumpf,acting as a Education administrator for [employer], MD.,have documented all relevant documentation on the behalf of Emily Dresser, MD,as directed by  Emily Dresser, MD while in the presence of Emily Dresser, MD.  I, Emily Dresser, MD, have reviewed all documentation for this visit. The documentation on 06/27/21 for the exam, diagnosis, procedures, and orders are all accurate and complete.   Signed, Emily Dresser, MD PhD 06/27/2021     [place]

## 2021-06-27 NOTE — Telephone Encounter (Signed)
No,wants to wait until after MRI

## 2021-06-27 NOTE — Patient Instructions (Signed)
Medication Instructions:  Your Physician recommend you continue on your current medication as directed.    *If you need a refill on your cardiac medications before your next appointment, please call your pharmacy*   Lab Work: None ordered today   Testing/Procedures: None ordered today   Follow-Up: At [facility], you and your health needs are our priority.  As part of our continuing mission to provide you with exceptional heart care, we have created designated Provider Care Teams.  These Care Teams include your primary Cardiologist (physician) and Advanced Practice Providers (APPs -  Physician Assistants and Nurse Practitioners) who all work together to provide you with the care you need, when you need it.  We recommend signing up for the patient portal called "MyChart".  Sign up information is provided on this After Visit Summary.  MyChart is used to connect with patients for Virtual Visits (Telemedicine).  Patients are able to view lab/test results, encounter notes, upcoming appointments, etc.  Non-urgent messages can be sent to your provider as well.   To learn more about what you can do with MyChart, go to [email or web address].    Your next appointment:   1 year(s)  The format for your next appointment:   In Person  Provider:   Buford Dresser, MD{   Other Instructions If you have more frequent episodes of hard beats or you lose consciousness again, please let me know. We can discuss an implantable loop recorder to try to capture these episodes.

## 2021-06-27 NOTE — Telephone Encounter (Signed)
Patient is returning her boot today and wanted an receipt ,does not want to exchange for a larger size.gave Vickie's number to contact concerning refund or credit to account.Please advise.

## 2021-06-28 MED ORDER — IPRATROPIUM BROMIDE 0.03 % NA SOLN: NASAL | 5 refills | Status: DC

## 2021-06-30 ENCOUNTER — Other Ambulatory Visit: Payer: Self-pay

## 2021-06-30 ENCOUNTER — Ambulatory Visit
Admission: RE | Admit: 2021-06-30 | Discharge: 2021-06-30 | Disposition: A | Discharge status: Home / Self Care | Payer: Medicare HMO | Source: Ambulatory Visit | Attending: Podiatry | Admitting: Podiatry

## 2021-06-30 DIAGNOSIS — M7989 Other specified soft tissue disorders: Secondary | ICD-10-CM | POA: Diagnosis not present

## 2021-06-30 DIAGNOSIS — M7671 Peroneal tendinitis, right leg: Secondary | ICD-10-CM

## 2021-06-30 DIAGNOSIS — M25471 Effusion, right ankle: Secondary | ICD-10-CM | POA: Diagnosis not present

## 2021-06-30 DIAGNOSIS — S86311A Strain of muscle(s) and tendon(s) of peroneal muscle group at lower leg level, right leg, initial encounter: Secondary | ICD-10-CM | POA: Diagnosis not present

## 2021-07-06 ENCOUNTER — Encounter: Payer: Medicare HMO | Admitting: Physical Therapy

## 2021-07-09 ENCOUNTER — Ambulatory Visit: Payer: Medicare HMO | Admitting: Podiatry

## 2021-07-09 ENCOUNTER — Other Ambulatory Visit: Payer: Self-pay

## 2021-07-09 ENCOUNTER — Telehealth: Payer: Self-pay | Admitting: *Deleted

## 2021-07-09 DIAGNOSIS — M722 Plantar fascial fibromatosis: Secondary | ICD-10-CM | POA: Diagnosis not present

## 2021-07-09 DIAGNOSIS — M7671 Peroneal tendinitis, right leg: Secondary | ICD-10-CM | POA: Diagnosis not present

## 2021-07-09 MED ORDER — METHYLPREDNISOLONE 4 MG PO TBPK: ORAL_TABLET | ORAL | 0 refills | Status: DC

## 2021-07-09 NOTE — Telephone Encounter (Signed)
Patient is calling to let Dr Maxie Barb nurse know that she will be exchanging Duvall for xsmall on Nov. 29 th.

## 2021-07-09 NOTE — Progress Notes (Signed)
Subjective:  Patient ID: Emily [name], female    DOB: October 28, 1940,  MRN: [id number]  Chief Complaint  Patient presents with   Tendonitis     mri follow up right    80 y.o. female returns for follow-up with the above complaint. History confirmed with patient.  Still quite painful she completed the MRI she was unable to wear the boot Objective:  Physical Exam: warm, good capillary refill, no trophic changes or ulcerative lesions, normal DP and PT pulses, and normal sensory exam. Left Foot: normal exam, no swelling, tenderness, instability; ligaments intact, full range of motion of all ankle/foot joints Right Foot:  sharp pain on palpation to 5th met base and w/ resisted eversion  No images are attached to the encounter.  Radiographs: Multiple views x-ray of the right foot: no fracture, dislocation noted, there is a 5th met base small enthesophyte  Study Result  Narrative & Impression  CLINICAL DATA:  Lateral ankle pain for the past 6 weeks.  No injury.   EXAM: MRI OF THE RIGHT ANKLE WITHOUT CONTRAST   TECHNIQUE: Multiplanar, multisequence MR imaging of the ankle was performed. No intravenous contrast was administered.   COMPARISON:  Right foot x-rays dated May 01, 2021.   FINDINGS: TENDONS   Peroneal: Peroneal longus tendon intact. Short segment split tear of the peroneal brevis tendon inferior to the lateral malleolus.   Posteromedial: Posterior tibial tendon intact. Flexor digitorum longus tendon intact. Flexor hallucis longus tendon intact.   Anterior: Tibialis anterior tendon intact. Extensor hallucis longus tendon intact Extensor digitorum longus tendon intact.   Achilles:  Intact.   Plantar Fascia: Thickening of the lateral cord plantar fascia at the fifth metatarsal base insertion with underlying marrow edema and small enthesophyte. Prominent surrounding soft tissue swelling. The proximal central band is unremarkable.   LIGAMENTS   Lateral:  Anterior talofibular ligament intact. Calcaneofibular ligament intact. Posterior talofibular ligament intact. Anterior and posterior tibiofibular ligaments intact.   Medial: Deltoid ligament intact. Spring ligament intact.   CARTILAGE   Ankle Joint: No joint effusion. Normal ankle mortise. No chondral defect.   Subtalar Joints/Sinus Tarsi: Normal subtalar joints. Small posterior subtalar joint effusion. Normal sinus tarsi.   Bones: No acute fracture or dislocation.  No suspicious bone lesion.   Soft Tissue: Lateral midfoot soft tissue swelling. No soft tissue mass or fluid collection.   IMPRESSION: 1. Insertional lateral cord plantar fasciopathy at the base of the fifth metatarsal. 2. Short segment split tear of the peroneal brevis tendon inferior to the lateral malleolus.     Electronically Signed   By: Titus Dubin M.D.   On: 06/30/2021 18:18   Assessment:   1. Peroneal tendinitis of right lower extremity   2. Plantar fasciitis of right foot      Plan:  Patient was evaluated and treated and all questions answered.  I discussed further treatment for the insertional tendinitis of the split tear she has as well as the insertional fascial apathy.  We discussed corticosteroid injection.  I discussed with her that if we proceed with this I would like to immobilize her in a boot so we will have to get a boot that fits her.  They are going to be traveling out of town for Thanksgiving and I recommend that we wait until after should they return before doing the injection so she is able to rest as much as possible.  In the interim I placed her on a methylprednisolone taper again which has previously been  helpful.  I will see her next week and will perform corticosteroid injection and immobilize her in a boot for 4 weeks Return in 8 days (on 07/17/2021) for tendon injection and boot .

## 2021-07-10 ENCOUNTER — Encounter: Payer: Medicare HMO | Admitting: Physical Therapy

## 2021-07-17 ENCOUNTER — Other Ambulatory Visit: Payer: Self-pay

## 2021-07-17 ENCOUNTER — Ambulatory Visit: Payer: Medicare HMO | Admitting: Podiatry

## 2021-07-17 DIAGNOSIS — M7671 Peroneal tendinitis, right leg: Secondary | ICD-10-CM

## 2021-07-17 NOTE — Progress Notes (Signed)
Subjective:  Patient ID: Emily [name], female    DOB: 1940-10-26,  MRN: [id number]  Chief Complaint  Patient presents with   Follow-up    Peroneal tendinitis of the right extremity, patient reports it is feel better since taking the prednisone but it is very painful without the Rx. Patient requesting a new cam boot as well.      80 y.o. female returns for follow-up with the above complaint. History confirmed with patient.  Has felt better after being on prednisone but is still having some pain.  Objective:  Physical Exam: warm, good capillary refill, no trophic changes or ulcerative lesions, normal DP and PT pulses, and normal sensory exam. Left Foot: normal exam, no swelling, tenderness, instability; ligaments intact, full range of motion of all ankle/foot joints Right Foot:  sharp pain on palpation to 5th met base and w/ resisted eversion  No images are attached to the encounter.  Radiographs: Multiple views x-ray of the right foot: no fracture, dislocation noted, there is a 5th met base small enthesophyte  Study Result  Narrative & Impression  CLINICAL DATA:  Lateral ankle pain for the past 6 weeks.  No injury.   EXAM: MRI OF THE RIGHT ANKLE WITHOUT CONTRAST   TECHNIQUE: Multiplanar, multisequence MR imaging of the ankle was performed. No intravenous contrast was administered.   COMPARISON:  Right foot x-rays dated May 01, 2021.   FINDINGS: TENDONS   Peroneal: Peroneal longus tendon intact. Short segment split tear of the peroneal brevis tendon inferior to the lateral malleolus.   Posteromedial: Posterior tibial tendon intact. Flexor digitorum longus tendon intact. Flexor hallucis longus tendon intact.   Anterior: Tibialis anterior tendon intact. Extensor hallucis longus tendon intact Extensor digitorum longus tendon intact.   Achilles:  Intact.   Plantar Fascia: Thickening of the lateral cord plantar fascia at the fifth metatarsal base insertion  with underlying marrow edema and small enthesophyte. Prominent surrounding soft tissue swelling. The proximal central band is unremarkable.   LIGAMENTS   Lateral: Anterior talofibular ligament intact. Calcaneofibular ligament intact. Posterior talofibular ligament intact. Anterior and posterior tibiofibular ligaments intact.   Medial: Deltoid ligament intact. Spring ligament intact.   CARTILAGE   Ankle Joint: No joint effusion. Normal ankle mortise. No chondral defect.   Subtalar Joints/Sinus Tarsi: Normal subtalar joints. Small posterior subtalar joint effusion. Normal sinus tarsi.   Bones: No acute fracture or dislocation.  No suspicious bone lesion.   Soft Tissue: Lateral midfoot soft tissue swelling. No soft tissue mass or fluid collection.   IMPRESSION: 1. Insertional lateral cord plantar fasciopathy at the base of the fifth metatarsal. 2. Short segment split tear of the peroneal brevis tendon inferior to the lateral malleolus.     Electronically Signed   By: Titus Dubin M.D.   On: 06/30/2021 18:18   Assessment:   1. Peroneal tendinitis of right lower extremity      Plan:  Patient was evaluated and treated and all questions answered.  We can discussed further treatment for this including injection therapy.  We proceeded today with injection of the peroneal tendon insertion on the fifth metatarsal base.  I discussed with her that I need her to be immobilized and rest is much as possible to mitigate any damage to the tendon.  Following sterile prep with Betadine I injected 4 mg of dexamethasone and 1/2 cc of lidocaine 2% plain into the insertion of the peroneus brevis at the insertion at the most painful maximal tenderness area.  She  tolerated it well.  She was placed into a short CAM boot and may be WBAT in this.  I assessed the CAM boot for fit and she was pleased with it.  Compression ankle gauntlet also given.  She will return in 3 weeks for follow-up.  No  follow-ups on file.

## 2021-07-25 ENCOUNTER — Other Ambulatory Visit (INDEPENDENT_AMBULATORY_CARE_PROVIDER_SITE_OTHER): Payer: Medicare HMO

## 2021-07-25 ENCOUNTER — Other Ambulatory Visit: Payer: Self-pay

## 2021-07-25 DIAGNOSIS — E038 Other specified hypothyroidism: Secondary | ICD-10-CM | POA: Diagnosis not present

## 2021-07-25 LAB — TSH: TSH: 0.86 u[IU]/mL (ref 0.35–5.50)

## 2021-07-26 ENCOUNTER — Ambulatory Visit: Payer: Medicare HMO | Admitting: Family Medicine

## 2021-08-09 ENCOUNTER — Ambulatory Visit: Payer: Medicare HMO | Admitting: Podiatry

## 2021-08-09 ENCOUNTER — Other Ambulatory Visit: Payer: Self-pay

## 2021-08-09 DIAGNOSIS — M7671 Peroneal tendinitis, right leg: Secondary | ICD-10-CM

## 2021-08-09 DIAGNOSIS — M722 Plantar fascial fibromatosis: Secondary | ICD-10-CM

## 2021-08-09 NOTE — Progress Notes (Signed)
Subjective:  Patient ID: Emily [name], female    DOB: Oct 26, 1940,  MRN: [id number]  Chief Complaint  Patient presents with   Foot Pain    3 week follow up right foot pain. Pt state she has not been resting as she should in the past few weeks. Pt also states that she has been having burning senstations when she is wearing her regular shoes.     80 y.o. female returns for follow-up with the above complaint. History confirmed with patient.  Still having some pain she says she has been busy planning for Christmas, still wearing the boot.  Think is about 40 to 50% better than it was  Objective:  Physical Exam: warm, good capillary refill, no trophic changes or ulcerative lesions, normal DP and PT pulses, and normal sensory exam. Left Foot: normal exam, no swelling, tenderness, instability; ligaments intact, full range of motion of all ankle/foot joints Right Foot:  sharp pain on palpation to 5th met base and w/ resisted eversion, improving since last visit  No images are attached to the encounter.  Radiographs: Multiple views x-ray of the right foot: no fracture, dislocation noted, there is a 5th met base small enthesophyte  Study Result  Narrative & Impression  CLINICAL DATA:  Lateral ankle pain for the past 6 weeks.  No injury.   EXAM: MRI OF THE RIGHT ANKLE WITHOUT CONTRAST   TECHNIQUE: Multiplanar, multisequence MR imaging of the ankle was performed. No intravenous contrast was administered.   COMPARISON:  Right foot x-rays dated May 01, 2021.   FINDINGS: TENDONS   Peroneal: Peroneal longus tendon intact. Short segment split tear of the peroneal brevis tendon inferior to the lateral malleolus.   Posteromedial: Posterior tibial tendon intact. Flexor digitorum longus tendon intact. Flexor hallucis longus tendon intact.   Anterior: Tibialis anterior tendon intact. Extensor hallucis longus tendon intact Extensor digitorum longus tendon intact.   Achilles:   Intact.   Plantar Fascia: Thickening of the lateral cord plantar fascia at the fifth metatarsal base insertion with underlying marrow edema and small enthesophyte. Prominent surrounding soft tissue swelling. The proximal central band is unremarkable.   LIGAMENTS   Lateral: Anterior talofibular ligament intact. Calcaneofibular ligament intact. Posterior talofibular ligament intact. Anterior and posterior tibiofibular ligaments intact.   Medial: Deltoid ligament intact. Spring ligament intact.   CARTILAGE   Ankle Joint: No joint effusion. Normal ankle mortise. No chondral defect.   Subtalar Joints/Sinus Tarsi: Normal subtalar joints. Small posterior subtalar joint effusion. Normal sinus tarsi.   Bones: No acute fracture or dislocation.  No suspicious bone lesion.   Soft Tissue: Lateral midfoot soft tissue swelling. No soft tissue mass or fluid collection.   IMPRESSION: 1. Insertional lateral cord plantar fasciopathy at the base of the fifth metatarsal. 2. Short segment split tear of the peroneal brevis tendon inferior to the lateral malleolus.     Electronically Signed   By: Titus Dubin M.D.   On: 06/30/2021 18:18   Assessment:   1. Peroneal tendinitis of right lower extremity   2. Plantar fasciitis of right foot      Plan:  Patient was evaluated and treated and all questions answered.  She has had some improvement after the injection.  I do think she probably has not rested as much as she should.  I recommend she continue the boot for up to 3 additional weeks and begin physical therapy exercises at home which I dispensed for her today.  We also discussed the option of  Topaz and platelet rich plasma injection if this is not improving.   Return in about 4 weeks (around 09/06/2021) for re-check peroneal tendinitis.

## 2021-08-09 NOTE — Patient Instructions (Signed)

## 2021-08-26 ENCOUNTER — Other Ambulatory Visit: Payer: Self-pay | Admitting: Internal Medicine

## 2021-08-28 ENCOUNTER — Encounter: Payer: Self-pay | Admitting: Podiatry

## 2021-08-28 NOTE — Telephone Encounter (Signed)
Please advise 

## 2021-08-29 NOTE — Telephone Encounter (Signed)
Thank you :)

## 2021-09-03 ENCOUNTER — Ambulatory Visit: Payer: Medicare HMO | Admitting: Podiatry

## 2021-09-03 ENCOUNTER — Other Ambulatory Visit: Payer: Self-pay

## 2021-09-03 DIAGNOSIS — M722 Plantar fascial fibromatosis: Secondary | ICD-10-CM

## 2021-09-03 DIAGNOSIS — M7671 Peroneal tendinitis, right leg: Secondary | ICD-10-CM | POA: Diagnosis not present

## 2021-09-03 NOTE — Progress Notes (Signed)
Subjective:  Patient ID: Emily [name], female    DOB: [birth year],  MRN: [id number]  Chief Complaint  Patient presents with   Consult    Surgery consult right foot    81 y.o. female returns for follow-up with the above complaint. History confirmed with patient.  Still has not improved.  We discussed over the phone the possibility of proceeding with Topaz procedure and platelet rich plasma injection and she is interested in proceeding with this.  Objective:  Physical Exam: warm, good capillary refill, no trophic changes or ulcerative lesions, normal DP and PT pulses, and normal sensory exam. Left Foot: normal exam, no swelling, tenderness, instability; ligaments intact, full range of motion of all ankle/foot joints Right Foot:  sharp pain on palpation to 5th met base and w/ resisted eversion, improving since last visit  No images are attached to the encounter.  Radiographs: Multiple views x-ray of the right foot: no fracture, dislocation noted, there is a 5th met base small enthesophyte  Study Result  Narrative & Impression  CLINICAL DATA:  Lateral ankle pain for the past 6 weeks.  No injury.   EXAM: MRI OF THE RIGHT ANKLE WITHOUT CONTRAST   TECHNIQUE: Multiplanar, multisequence MR imaging of the ankle was performed. No intravenous contrast was administered.   COMPARISON:  Right foot x-rays dated May 01, 2021.   FINDINGS: TENDONS   Peroneal: Peroneal longus tendon intact. Short segment split tear of the peroneal brevis tendon inferior to the lateral malleolus.   Posteromedial: Posterior tibial tendon intact. Flexor digitorum longus tendon intact. Flexor hallucis longus tendon intact.   Anterior: Tibialis anterior tendon intact. Extensor hallucis longus tendon intact Extensor digitorum longus tendon intact.   Achilles:  Intact.   Plantar Fascia: Thickening of the lateral cord plantar fascia at the fifth metatarsal base insertion with underlying marrow  edema and small enthesophyte. Prominent surrounding soft tissue swelling. The proximal central band is unremarkable.   LIGAMENTS   Lateral: Anterior talofibular ligament intact. Calcaneofibular ligament intact. Posterior talofibular ligament intact. Anterior and posterior tibiofibular ligaments intact.   Medial: Deltoid ligament intact. Spring ligament intact.   CARTILAGE   Ankle Joint: No joint effusion. Normal ankle mortise. No chondral defect.   Subtalar Joints/Sinus Tarsi: Normal subtalar joints. Small posterior subtalar joint effusion. Normal sinus tarsi.   Bones: No acute fracture or dislocation.  No suspicious bone lesion.   Soft Tissue: Lateral midfoot soft tissue swelling. No soft tissue mass or fluid collection.   IMPRESSION: 1. Insertional lateral cord plantar fasciopathy at the base of the fifth metatarsal. 2. Short segment split tear of the peroneal brevis tendon inferior to the lateral malleolus.     Electronically Signed   By: Titus Dubin M.D.   On: 06/30/2021 18:18   Assessment:   1. Peroneal tendinitis of right lower extremity   2. Plantar fasciitis of right foot       Plan:  Patient was evaluated and treated and all questions answered.  Discussed proceeding with the Topaz partial fasciectomy and micro debridement of the lateral band of the plantar fascia as well as the peroneal tendon in conjunction with a platelet rich plasma injection in both areas.  We discussed the risk benefits and potential complications of this.  We also discussed the postoperative course.  All questions were addressed.  Informed consent was signed and reviewed.  Surgery will be scheduled for this Friday.   Surgical plan:  Procedure: -Right foot partial fasciectomy and micro debridement of the lateral  band of the plantar fascia and peroneus brevis and peroneus longus tendons with platelet rich plasma injection  Location: -[facility]  Anesthesia plan: -IV sedation with  local block  Postoperative pain plan: - Tylenol 1000 mg every 6 hours, tramadol 50 mg every 6 hours as needed  DVT prophylaxis: -None required  WB Restrictions / DME needs: -WBAT in CAM boot postop she already has this    No follow-ups on file.

## 2021-09-04 ENCOUNTER — Telehealth: Payer: Self-pay | Admitting: Urology

## 2021-09-04 NOTE — Telephone Encounter (Signed)
DOS - 09/07/21  FASCIECTOMY PLANTAR FASC RIGHT --- 53912 DEBRIDE RIGHT --- 11044  [employer] EFFECTIVE DATE - 08/19/20   SPOKE WITH DOMINIC E. WITH [employer] AND HE STATED THAT FOR CPT CODES 25834 AND 62194 NO PRIOR AUTH IS REQUIRED.  REF # [id number]

## 2021-09-07 ENCOUNTER — Encounter: Payer: Self-pay | Admitting: Podiatry

## 2021-09-07 ENCOUNTER — Other Ambulatory Visit: Payer: Self-pay | Admitting: Podiatry

## 2021-09-07 DIAGNOSIS — M7671 Peroneal tendinitis, right leg: Secondary | ICD-10-CM | POA: Diagnosis not present

## 2021-09-07 DIAGNOSIS — M65871 Other synovitis and tenosynovitis, right ankle and foot: Secondary | ICD-10-CM

## 2021-09-07 DIAGNOSIS — M722 Plantar fascial fibromatosis: Secondary | ICD-10-CM

## 2021-09-07 MED ORDER — TRAMADOL HCL 50 MG PO TABS: 50.0000 mg | ORAL_TABLET | Freq: Three times a day (TID) | ORAL | 0 refills | Status: AC | PRN

## 2021-09-07 NOTE — Progress Notes (Signed)
09/07/21 Topaz procedure of peroneal tendons and plantar fascia, PRP injection right foot

## 2021-09-08 ENCOUNTER — Telehealth: Payer: Self-pay | Admitting: Podiatry

## 2021-09-08 NOTE — Telephone Encounter (Signed)
Spoke with patient by phone her bandage felt tight and was causing pain advised she can loosen the Ace wrap take tramadol 1 to 2 tablets every 6 hours.  She said she will try this and continue icing.

## 2021-09-11 ENCOUNTER — Other Ambulatory Visit: Payer: Self-pay

## 2021-09-11 ENCOUNTER — Ambulatory Visit (INDEPENDENT_AMBULATORY_CARE_PROVIDER_SITE_OTHER): Payer: Medicare HMO | Admitting: Podiatry

## 2021-09-11 DIAGNOSIS — M722 Plantar fascial fibromatosis: Secondary | ICD-10-CM

## 2021-09-11 DIAGNOSIS — M7671 Peroneal tendinitis, right leg: Secondary | ICD-10-CM

## 2021-09-13 DIAGNOSIS — Z23 Encounter for immunization: Secondary | ICD-10-CM | POA: Diagnosis not present

## 2021-09-13 DIAGNOSIS — L820 Inflamed seborrheic keratosis: Secondary | ICD-10-CM | POA: Diagnosis not present

## 2021-09-13 NOTE — Progress Notes (Signed)
DOS 09/07/2021 Topaz procedure right peroneal tendon and plantar fascia with platelet rich plasma injection

## 2021-09-16 NOTE — Progress Notes (Signed)
°  Subjective:  Patient ID: Emily [name], female    DOB: [birth year],  MRN: [id number]  Chief Complaint  Patient presents with   Routine Post Op      POV #1 DOS 09/07/2021 TOPAZ PROCEDURE RT PERONEAL TENDON & PLANTAR FASCIA W/PRP INJECTION    81 y.o. female returns for post-op check.  Doing well her pain is improved considerably  Review of Systems: Negative except as noted in the HPI. Denies N/V/F/Ch.   Objective:  There were no vitals filed for this visit. There is no height or weight on file to calculate BMI. Constitutional Well developed. Well nourished.  Vascular Foot warm and well perfused. Capillary refill normal to all digits.  Calf is soft and supple, no posterior calf or knee pain, negative Homans' sign  Neurologic Normal speech. Oriented to person, place, and time. Epicritic sensation to light touch grossly present bilaterally.  Dermatologic Skin healing well without signs of infection.   Orthopedic: Tenderness to palpation noted about the surgical site.    Assessment:   1. Peroneal tendinitis of right lower extremity   2. Plantar fasciitis of right foot    Plan:  Patient was evaluated and treated and all questions answered.  S/p foot surgery right -Progressing as expected post-operatively. -Can transition to regular shoe gear when tolerated.  May need to wear boot for another 2 to 3 weeks pending progress.  Would like her to restart physical therapy and I sent a new referral for this to [facility]  Return in about 5 weeks (around 10/16/2021).

## 2021-09-19 ENCOUNTER — Encounter: Payer: Self-pay | Admitting: Podiatry

## 2021-09-25 ENCOUNTER — Encounter: Payer: Medicare HMO | Admitting: Podiatry

## 2021-09-25 NOTE — Therapy (Signed)
OUTPATIENT PHYSICAL THERAPY LOWER EXTREMITY EVALUATION   Patient Name: Emily [name] MRN: [id number] DOB:1941/01/29, 81 y.o., female Today's Date: 09/26/2021   PT End of Session - 09/26/21 0956     Visit Number 1    Number of Visits 17    Date for PT Re-Evaluation 11/21/21    Authorization Type [employer] MCR    Authorization Time Period FOTO v6, v10; KX mod at v15    Progress Note Due on Visit 10    PT Start Time 0928    PT Stop Time 1020   Pt arrived 10 minutes late to her appointment.   PT Time Calculation (min) 52 min    Activity Tolerance Patient tolerated treatment well    Behavior During Therapy WFL for tasks assessed/performed             Past Medical History:  Diagnosis Date   Actinic keratosis    Allergic rhinitis    BACK PAIN    CARCINOMA, BREAST, 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   L s/p lumpectomy, chemo and XRT   Cervicalgia    Cholelithiasis    DIVERTICULOSIS, COLON    ENDOMETRIOSIS    GERD (gastroesophageal reflux disease)    Heart palpitations    Hypertension    HYPOTHYROIDISM    Left wrist fracture 11/06/2016   10/2016 - fell of high stool   Personal history of chemotherapy    Personal history of radiation therapy    Pleural plaque without asbestos    CT chest 10/2010: R>L lobes, upper> lower   Squamous cell carcinoma    History of BC   Past Surgical History:  Procedure Laterality Date   BREAST LUMPECTOMY  2001   left   CATARACT EXTRACTION Bilateral 09/2014   both eyes   CHOLECYSTECTOMY  2010   ENDOMETRIAL ABLATION     EXCISION / BIOPSY BREAST / NIPPLE / DUCT Left 2001   Patient Active Problem List   Diagnosis Date Noted   Cervicogenic headache 04/10/2021   Acute bronchitis 04/10/2021   Elevated blood pressure reading 04/10/2021   Skin abnormality 02/27/2021   Aortic atherosclerosis ([place]) 09/22/2020   Urinary urgency 03/23/2020   CKD (chronic kidney disease) stage 3, GFR 30-59 ml/min (HCC) 03/22/2020   Hyperglycemia 03/22/2020    Change in stool 11/17/2019   Right-sided chest wall pain 11/17/2019   Urinary retention 09/23/2019   SOB (shortness of breath) 01/15/2019   Upper airway cough syndrome 01/15/2019   Belching 12/14/2018   Reactive airway disease 12/05/2018   Tear of left rotator cuff 10/05/2018   Pre-syncope 05/19/2018   DDD (degenerative disc disease), cervical 01/15/2018   Neuralgia 09/12/2017   Presbycusis of both ears 12/11/2016   Memory difficulties 11/06/2016   IBS (irritable bowel syndrome) 04/16/2016   Hyperlipidemia 09/10/2015   Osteopenia 09/08/2015   Palpitations    Pleural plaque without asbestos 12/03/2010   Chronic neck pain 12/18/2009   DIVERTICULOSIS, COLON 03/02/2009   Lumbago 03/11/2008   Actinic keratosis 01/23/2008   CARCINOMA, BREAST, ESTROGEN RECEPTOR NEGATIVE 10/12/2007   Hypothyroidism 06/18/2007   Allergic rhinitis 06/11/2007    PCP: Binnie Rail, MD  REFERRING PROVIDER: Criselda Peaches, DPM  REFERRING DIAG:  M76.71 (ICD-10-CM) - Peroneal tendinitis of right lower extremity  M72.2 (ICD-10-CM) - Plantar fasciitis of right foot    THERAPY DIAG:  Pain in right ankle and joints of right foot  Difficulty in walking, not elsewhere classified  Muscle weakness (generalized)  ONSET DATE: 04/2021  SUBJECTIVE:  SUBJECTIVE STATEMENT: Pt reports primary c/o lateral Rt foot pain/ burning sensation of insidious onset beginning in September of last year.  She reports that after an MRI, she was told that she has peroneal tendinopathy. She was subsequently given a PRP injection and TOPAZ procedure on January 20th and given an ankle boot to wear for three weeks. She will be out of the boot next week. She was previously seen in PT for this problem last November prior to getting an MRI. She reports current pain of 5/10, worst pain of 10/10, and best pain of 2-3/10. She reports that the pain comes and goes and is present whether in the boot or not. Aggravating factors include  prolonged walking and standing >5 minutes, as well as sleeping. Easing factors include rest. Red flag screening negative.   PERTINENT HISTORY: TOPAZ procedure and PRP injection to Rt peroneal tendon on 09/07/2021  PAIN:  Are you having pain? Yes NPRS scale: 5/10 Pain location: Rt lateral foot Pain description: intermittent, burning, and aching  Aggravating factors: Prolonged standing/ walking >5 minutes, sleeping Relieving factors: Rest  PRECAUTIONS: None  WEIGHT BEARING RESTRICTIONS Yes WBAT  FALLS:  Has patient fallen in last 6 months? No, Number of falls: 0  LIVING ENVIRONMENT: Lives with: lives with their family Lives in: House/apartment Stairs: Yes; External: 2 steps; on right going up Has following equipment at home: Single point cane, Environmental consultant - 2 wheeled, Electronics engineer, and Grab bars  OCCUPATION: Retired  PLOF: [place]: Household chores, yard work   OBJECTIVE:   DIAGNOSTIC FINDINGS: 06/30/2021: MR Ankle Right without Contrast:  IMPRESSION: 1. Insertional lateral cord plantar fasciopathy at the base of the fifth metatarsal. 2. Short segment split tear of the peroneal brevis tendon inferior to the lateral malleolus.  PATIENT SURVEYS:  FOTO 51%, projected 61% in 12 visits  COGNITION:  Overall cognitive status: Within functional limits for tasks assessed     SENSATION:  Light touch: Appears intact  MUSCLE LENGTH: Gastrocnemius: moderately tight BIL Soleus: moderately tight BIL Hamstring 90/90: 30 degrees shy BIL  POSTURE:  WNL, BIL pes planus  PALPATION: TTP to lateral Rt foot along 5th metatarsal and along peroneal tendons.  LE AROM/PROM:  A/PROM Right 09/26/2021 Left 09/26/2021  Ankle dorsiflexion 0/10 5/10  Ankle plantarflexion 55/55 55/55  Ankle inversion 25/40p! 25/45  Ankle eversion 25/40p! 25/40   (Blank rows = not tested)  LE MMT:  MMT Right 09/26/2021 Left 09/26/2021  Hip flexion 4+/5 4+/5  Hip extension    Hip  abduction    Knee flexion 5/5 5/5  Knee extension 5/5 5/5  Ankle dorsiflexion 4+/5 5/5  Ankle plantarflexion 4+/5 5/5  Ankle inversion 4/5p! 5/5  Ankle eversion 4/5p! 5/5   (Blank rows = not tested)  FUNCTIONAL TESTS:  Squat: WNL DL heel raise: x10 WNL  GAIT: Distance walked: 20 Assistive device utilized: None Level of assistance: Complete Independence Comments: mild antalgic gait, BIL flat feet, mid-foot strike BIL    TODAY'S TREATMENT: [facility] Adult PT Treatment:                                                DATE: 09/26/2021 Therapeutic Exercise: Supine Peroneal Nerve Glide x10 on Rt Seated Ankle Eversion with YTB Resistance x10 on Rt Ankle Inversion Eversion Towel Slide x10 on Rt Seated Heel Raise with HANDHOLD RESISTANCE x10  Seated Calf Stretch with Strap x30sec on Rt Manual Therapy: N/A Neuromuscular re-ed: N/A Therapeutic Activity: N/A Modalities: N/A Self Care: N/A    PATIENT EDUCATION:  Education details: Educated on POC, prognosis, and HEP Person educated: Patient Education method: Consulting civil engineer, Media planner, and Handouts Education comprehension: verbalized understanding and returned demonstration   HOME EXERCISE PROGRAM: Access Code: [email or web address] URL: [email or web address] Date: 09/26/2021 Prepared by: Vanessa Armada  Exercises Supine Peroneal Nerve Glide - 1 x daily - 7 x weekly - 3 sets - 15 reps Seated Ankle Eversion with Resistance - 1 x daily - 7 x weekly - 3 sets - 10 reps - 3-sec hold Ankle Inversion Eversion Towel Slide - 1 x daily - 7 x weekly - 2 sets - 10 reps Seated Heel Raise with HANDHOLD RESISTANCE - 1 x daily - 7 x weekly - 3 sets - 10 reps - 3-sec hold Seated Calf Stretch with Strap - 1 x daily - 7 x weekly - 2 sets - 1-min hold   ASSESSMENT:  CLINICAL IMPRESSION: Patient is a 81 y.o. F who was seen today for physical therapy evaluation and treatment for chronic lateral and plantar foot pain associated with known  peroneal and plantar fascial tendinopathy as detailed in her recent MRI report; she is also 19 days s/p PRP injection and TOPAZ procedure to her Rt lateral foot. Objective impairments include Abnormal gait, difficulty walking, decreased ROM, decreased strength, increased edema, impaired flexibility, and pain. These impairments are limiting patient from cleaning, meal prep, laundry, yard work, and shopping. Patient will benefit from skilled PT to address above impairments and improve overall function.  REHAB POTENTIAL: Good  CLINICAL DECISION MAKING: Stable/uncomplicated  EVALUATION COMPLEXITY: Low   GOALS: Goals reviewed with patient? Yes  SHORT TERM GOALS:  STG Name Target Date Goal status  1 Pt will report understanding and adherence to her HEP in order to promote independence in the management of her primary impairments. Baseline: HEP provided at eval 10/24/2021 INITIAL   LONG TERM GOALS:   LTG Name Target Date Goal status  1 Pt will achieve a FOTO score of 61% in order to demonstrate improved functional ability as it relates to her lateral foot pain. Baseline: 51% 11/21/2021 INITIAL  2 Pt will achieve BIL ankle dorsiflexion AROM of 10 degrees in order to promote heel strike during functional gait. Baseline: 0d on Rt, 5d on Lt 11/21/2021 INITIAL  3 Pt will report ability to walk 30 minutes without a boot and with 0-2/10 pain in order to grocery shop with less limitation. Baseline: >5/10 pain after 5 minutes of walking 11/21/2021 INITIAL  4 Pt will report ability to sleep through the night without being woken due to pain in order to be well-rested for ADLs. Baseline: Pt woken several times each night due to pain. 11/21/2021 INITIAL  5 Pt will achieve global Rt ankle strength of 5/5 without pain in order to walk on uneven surfaces with good motor control. Baseline: See MMT chart 11/21/2021 INITIAL   PLAN: PT FREQUENCY: 2x/week  PT DURATION: 8 weeks  PLANNED INTERVENTIONS: Therapeutic  exercises, Therapeutic activity, Neuro Muscular re-education, Balance training, Gait training, Patient/Family education, Joint mobilization, Stair training, Dry Needling, Electrical stimulation, Cryotherapy, Taping, Vasopneumatic device, and Manual therapy  PLAN FOR NEXT SESSION: Progress peroneal/ posterior tibialis, calf strengthening as able, desensitization techniques, avoid heat due to hx of cancer   Vanessa , PT, DPT 09/26/21 10:40 AM

## 2021-09-26 ENCOUNTER — Encounter: Payer: Self-pay | Admitting: Internal Medicine

## 2021-09-26 ENCOUNTER — Ambulatory Visit: Payer: Medicare HMO | Attending: Podiatry

## 2021-09-26 ENCOUNTER — Other Ambulatory Visit: Payer: Self-pay

## 2021-09-26 DIAGNOSIS — M25571 Pain in right ankle and joints of right foot: Secondary | ICD-10-CM | POA: Insufficient documentation

## 2021-09-26 DIAGNOSIS — M6281 Muscle weakness (generalized): Secondary | ICD-10-CM | POA: Insufficient documentation

## 2021-09-26 DIAGNOSIS — R2689 Other abnormalities of gait and mobility: Secondary | ICD-10-CM | POA: Insufficient documentation

## 2021-09-26 DIAGNOSIS — R262 Difficulty in walking, not elsewhere classified: Secondary | ICD-10-CM | POA: Diagnosis present

## 2021-09-26 DIAGNOSIS — M722 Plantar fascial fibromatosis: Secondary | ICD-10-CM | POA: Insufficient documentation

## 2021-09-26 DIAGNOSIS — M7671 Peroneal tendinitis, right leg: Secondary | ICD-10-CM | POA: Insufficient documentation

## 2021-09-26 NOTE — Progress Notes (Signed)
Subjective:    Patient ID: Emily [name], female    DOB: 01-15-1941, 81 y.o.   MRN: [id number]   This visit occurred during the SARS-CoV-2 public health emergency.  Safety protocols were in place, including screening questions prior to the visit, additional usage of staff PPE, and extensive cleaning of exam room while observing appropriate contact time as indicated for disinfecting solutions.    HPI She is here for a physical exam.   Following with podiatry for peroneal tendinitis, plantar fasciitis.      She is not sleeping well - ? Related to foot.  When she gets up to go to the bathroom she can not go back to sleep.  She probably gets 4-5 hrs of broken up sleep.  She has not slept well for a long time, but the foot may have made it worse.  She is not tired during the day. She has tried some otc natural supplements   Urine flow is weak then it stops and starts again.  Then she has an urgency and has to go right then - she sometimes does not make it.  Increasing her fluids helps. Sometimes difficulty emptying the bladder.   CKD-she wonders what else she can do for it  Sometimes says the wrong word even though thinking of the right word.  Still some short term memory issues.  Can not think of what she wants to say sometimes, but hours later will come to her.   Her husband says she is high in anxiety.  She denies feeling anxious.  Medications and allergies reviewed with patient and updated if appropriate.  Patient Active Problem List   Diagnosis Date Noted   Cervicogenic headache 04/10/2021   Acute bronchitis 04/10/2021   Elevated blood pressure reading 04/10/2021   Skin abnormality 02/27/2021   Aortic atherosclerosis ([place]) 09/22/2020   Urinary urgency 03/23/2020   CKD (chronic kidney disease) stage 3, GFR 30-59 ml/min (HCC) 03/22/2020   Hyperglycemia 03/22/2020   Change in stool 11/17/2019   Right-sided chest wall pain 11/17/2019   Urinary retention 09/23/2019    SOB (shortness of breath) 01/15/2019   Upper airway cough syndrome 01/15/2019   Belching 12/14/2018   Reactive airway disease 12/05/2018   Tear of left rotator cuff 10/05/2018   Pre-syncope 05/19/2018   DDD (degenerative disc disease), cervical 01/15/2018   Neuralgia 09/12/2017   Presbycusis of both ears 12/11/2016   Memory difficulties 11/06/2016   IBS (irritable bowel syndrome) 04/16/2016   Hyperlipidemia 09/10/2015   Osteopenia 09/08/2015   Palpitations    Pleural plaque without asbestos 12/03/2010   Chronic neck pain 12/18/2009   DIVERTICULOSIS, COLON 03/02/2009   Lumbago 03/11/2008   Actinic keratosis 01/23/2008   CARCINOMA, BREAST, ESTROGEN RECEPTOR NEGATIVE 10/12/2007   Hypothyroidism 06/18/2007   Allergic rhinitis 06/11/2007    Current Outpatient Medications on File Prior to Visit  Medication Sig Dispense Refill   albuterol (VENTOLIN HFA) 108 (90 Base) MCG/ACT inhaler Inhale 2 puffs into the lungs every 6 (six) hours as needed for wheezing or shortness of breath. 8 g 0   atenolol (TENORMIN) 25 MG tablet TAKE 1 TABLET BY MOUTH EVERY DAY 90 tablet 1   azelastine (ASTELIN) 0.1 % nasal spray Place 2 sprays into both nostrils daily as needed for rhinitis. Use in each nostril as directed 30 mL 12   Calcium-Vitamin D-Vitamin K 750-500-40 MG-UNT-MCG TABS Take 1 tablet by mouth 2 (two) times daily.     chlorpheniramine (CHLOR-TRIMETON) 4 MG tablet  Take 4 mg by mouth 2 (two) times daily as needed for allergies.     cholecalciferol (VITAMIN D) 25 MCG (1000 UNIT) tablet Take 1,000 Units by mouth daily.     EPINEPHrine 0.3 mg/0.3 mL IJ SOAJ injection Inject 0.3 mg into the muscle as needed for anaphylaxis. 1 each 0   ipratropium (ATROVENT) 0.03 % nasal spray INSTILL 2 SPRAYS IN EACH NOSTRIL EVERY 12 HOURS 30 mL 5   levothyroxine (SYNTHROID) 88 MCG tablet Take 1 tablet (88 mcg total) by mouth daily. 90 tablet 1   methocarbamol (ROBAXIN) 500 MG tablet Take 1 tablet (500 mg total) by mouth  at bedtime as needed for muscle spasms. 30 tablet 1   triamcinolone (KENALOG) 0.025 % ointment Apply 1 application topically 2 (two) times daily. Use as needed. 30 g 1   [employer] ALLERGY RELIEF-D 5-120 MG tablet Take 1 tablet by mouth 2 (two) times daily.     No current facility-administered medications on file prior to visit.    Past Medical History:  Diagnosis Date   Actinic keratosis    Allergic rhinitis    BACK PAIN    CARCINOMA, BREAST, 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   L s/p lumpectomy, chemo and XRT   Cervicalgia    Cholelithiasis    DIVERTICULOSIS, COLON    ENDOMETRIOSIS    GERD (gastroesophageal reflux disease)    Heart palpitations    Hypertension    HYPOTHYROIDISM    Left wrist fracture 11/06/2016   10/2016 - fell of high stool   Personal history of chemotherapy    Personal history of radiation therapy    Pleural plaque without asbestos    CT chest 10/2010: R>L lobes, upper> lower   Squamous cell carcinoma    History of BC    Past Surgical History:  Procedure Laterality Date   BREAST LUMPECTOMY  2001   left   CATARACT EXTRACTION Bilateral 09/2014   both eyes   CHOLECYSTECTOMY  2010   ENDOMETRIAL ABLATION     EXCISION / BIOPSY BREAST / NIPPLE / DUCT Left 2001    Social History   Socioeconomic History   Marital status: Married    Spouse name: Not on file   Number of children: 2   Years of education: Not on file   Highest education level: Not on file  Occupational History   Occupation: Retired    Comment: Chiropractor  Tobacco Use   Smoking status: Never   Smokeless tobacco: Never  Vaping Use   Vaping Use: Never used  Substance and Sexual Activity   Alcohol use: No    Comment: 1 drink every 2 months   Drug use: No   Sexual activity: Not Currently    Birth control/protection: Post-menopausal  Other Topics Concern   Not on file  Social History Narrative   Married, lives in [place] area since 2008 from [place] to be close to dtr   Social Determinants  of Radio broadcast assistant Strain: Low Risk    Difficulty of Paying Living Expenses: Not hard at all  Food Insecurity: No Food Insecurity   Worried About Charity fundraiser in the Last Year: Never true   Arboriculturist in the Last Year: Never true  Transportation Needs: No Transportation Needs   Lack of Transportation (Medical): No   Lack of Transportation (Non-Medical): No  Physical Activity: Sufficiently Active   Days of Exercise per Week: 5 days   Minutes of Exercise per Session: 30  min  Stress: No Stress Concern Present   Feeling of Stress : Not at all  Social Connections: Socially Integrated   Frequency of Communication with Friends and Family: More than three times a week   Frequency of Social Gatherings with Friends and Family: More than three times a week   Attends Religious Services: More than 4 times per year   Active Member of [employer] or Organizations: Yes   Attends Archivist Meetings: 1 to 4 times per year   Marital Status: Married    Family History  Problem Relation Age of Onset   Dementia Mother    Heart disease Father    Emphysema Father        smoked and worked with asbestos   Colon cancer Neg Hx    Esophageal cancer Neg Hx    Rectal cancer Neg Hx     Review of Systems  Constitutional:  Negative for chills and fever.  Eyes:  Negative for visual disturbance.  Respiratory:  Negative for cough, shortness of breath and wheezing.   Cardiovascular:  Negative for chest pain, palpitations and leg swelling.  Gastrointestinal:  Negative for abdominal pain, blood in stool, constipation, diarrhea and nausea.       No gerd  Genitourinary:  Positive for frequency and urgency. Negative for dysuria.  Musculoskeletal:  Negative for arthralgias (mild).  Skin:  Negative for rash.  Neurological:  Negative for dizziness and headaches.  Psychiatric/Behavioral:  Positive for sleep disturbance. Negative for dysphoric mood. The patient is not nervous/anxious.        Objective:   Vitals:   09/27/21 0920  BP: 118/76  Pulse: 77  Temp: 97.9 F (36.6 C)  SpO2: 96%   There were no vitals filed for this visit. Body mass index is 23.92 kg/m.  BP Readings from Last 3 Encounters:  09/27/21 118/76  06/27/21 114/74  05/23/21 (!) 137/91    Wt Readings from Last 3 Encounters:  06/27/21 122 lb 8 oz (55.6 kg)  05/23/21 120 lb (54.4 kg)  05/20/21 120 lb (54.4 kg)    Depression screen [facility] 2/9 11/29/2020 09/25/2020 09/23/2019 09/16/2018 09/12/2017  Decreased Interest 0 0 0 0 0  Down, Depressed, Hopeless 0 0 0 0 0  PHQ - 2 Score 0 0 0 0 0  Altered sleeping - 2 - - -  Tired, decreased energy - 0 - - -  Change in appetite - 0 - - -  Feeling bad or failure about yourself  - 0 - - -  Trouble concentrating - 0 - - -  Moving slowly or fidgety/restless - 0 - - -  Suicidal thoughts - 0 - - -  PHQ-9 Score - 2 - - -  Difficult doing work/chores - Not difficult at all - - -  Some recent data might be hidden     No flowsheet data found.     Physical Exam Constitutional: She appears well-developed and well-nourished. No distress.  HENT:  Head: Normocephalic and atraumatic.  Right Ear: External ear normal. Normal ear canal and TM Left Ear: External ear normal.  Normal ear canal and TM Mouth/Throat: Oropharynx is clear and moist.  Eyes: Conjunctivae and EOM are normal.  Neck: Neck supple. No tracheal deviation present. No thyromegaly present.  No carotid bruit  Cardiovascular: Normal rate, regular rhythm and normal heart sounds.   No murmur heard.  No edema. Pulmonary/Chest: Effort normal and breath sounds normal. No respiratory distress. She has no wheezes. She has no  rales.  Breast: deferred   Abdominal: Soft. She exhibits no distension. There is no tenderness.  Lymphadenopathy: She has no cervical adenopathy.  Skin: Skin is warm and dry. She is not diaphoretic.  Psychiatric: She has a normal mood and affect. Her behavior is normal.     Lab  Results  Component Value Date   WBC 7.3 09/25/2020   HGB 14.7 09/25/2020   HCT 43.8 09/25/2020   PLT 174.0 09/25/2020   GLUCOSE 92 09/25/2020   CHOL 204 (H) 09/25/2020   TRIG 98.0 09/25/2020   HDL 56.00 09/25/2020   LDLDIRECT 134.0 11/06/2016   LDLCALC 128 (H) 09/25/2020   ALT 15 09/25/2020   AST 21 09/25/2020   NA 139 09/25/2020   K 4.4 09/25/2020   CL 103 09/25/2020   CREATININE 1.06 09/25/2020   BUN 26 (H) 09/25/2020   CO2 29 09/25/2020   TSH 0.86 07/25/2021   HGBA1C 6.0 09/25/2020         Assessment & Plan:   Physical exam: Screening blood work  ordered Exercise  not regular no bleeding issues Weight normal Substance abuse  none   Reviewed recommended immunizations.   Health Maintenance  Topic Date Due   COVID-19 Vaccine (4 - Booster for Pfizer series) 07/07/2020   INFLUENZA VACCINE  03/19/2021   DEXA SCAN  09/27/2021   TETANUS/TDAP  07/07/2027   Pneumonia Vaccine 55+ Years old  Completed   Hepatitis C Screening  Completed   Zoster Vaccines- Shingrix  Completed   HPV VACCINES  Aged Out          See Problem List for Assessment and Plan of chronic medical problems.

## 2021-09-26 NOTE — Patient Instructions (Addendum)
Blood work was ordered.     Medications changes include :   trazodone for sleep  Your prescription(s) have been submitted to your pharmacy. Please take as directed and contact our office if you believe you are having problem(s) with the medication(s).   A bone density was ordered.    Please followup in 1 year   Health Maintenance, Female Adopting a healthy lifestyle and getting preventive care are important in promoting health and wellness. Ask your health care provider about: The right schedule for you to have regular tests and exams. Things you can do on your own to prevent diseases and keep yourself healthy. What should I know about diet, weight, and exercise? Eat a healthy diet  Eat a diet that includes plenty of vegetables, fruits, low-fat dairy products, and lean protein. Do not eat a lot of foods that are high in solid fats, added sugars, or sodium. Maintain a healthy weight Body mass index (BMI) is used to identify weight problems. It estimates body fat based on height and weight. Your health care provider can help determine your BMI and help you achieve or maintain a healthy weight. Get regular exercise Get regular exercise. This is one of the most important things you can do for your health. Most adults should: Exercise for at least 150 minutes each week. The exercise should increase your heart rate and make you sweat (moderate-intensity exercise). Do strengthening exercises at least twice a week. This is in addition to the moderate-intensity exercise. Spend less time sitting. Even light physical activity can be beneficial. Watch cholesterol and blood lipids Have your blood tested for lipids and cholesterol at 80 years of age, then have this test every 5 years. Have your cholesterol levels checked more often if: Your lipid or cholesterol levels are high. You are older than 80 years of age. You are at high risk for heart disease. What should I know about cancer  screening? Depending on your health history and family history, you may need to have cancer screening at various ages. This may include screening for: Breast cancer. Cervical cancer. Colorectal cancer. Skin cancer. Lung cancer. What should I know about heart disease, diabetes, and high blood pressure? Blood pressure and heart disease High blood pressure causes heart disease and increases the risk of stroke. This is more likely to develop in people who have high blood pressure readings or are overweight. Have your blood pressure checked: Every 3-5 years if you are 98-69 years of age. Every year if you are 39 years old or older. Diabetes Have regular diabetes screenings. This checks your fasting blood sugar level. Have the screening done: Once every three years after age 72 if you are at a normal weight and have a low risk for diabetes. More often and at a younger age if you are overweight or have a high risk for diabetes. What should I know about preventing infection? Hepatitis B If you have a higher risk for hepatitis B, you should be screened for this virus. Talk with your health care provider to find out if you are at risk for hepatitis B infection. Hepatitis C Testing is recommended for: Everyone born from [birth year] through 1964. Anyone with known risk factors for hepatitis C. Sexually transmitted infections (STIs) Get screened for STIs, including gonorrhea and chlamydia, if: You are sexually active and are younger than 80 years of age. You are older than 80 years of age and your health care provider tells you that you are at risk for  this type of infection. Your sexual activity has changed since you were last screened, and you are at increased risk for chlamydia or gonorrhea. Ask your health care provider if you are at risk. Ask your health care provider about whether you are at high risk for HIV. Your health care provider may recommend a prescription medicine to help prevent HIV  infection. If you choose to take medicine to prevent HIV, you should first get tested for HIV. You should then be tested every 3 months for as long as you are taking the medicine. Pregnancy If you are about to stop having your period (premenopausal) and you may become pregnant, seek counseling before you get pregnant. Take 400 to 800 micrograms (mcg) of folic acid every day if you become pregnant. Ask for birth control (contraception) if you want to prevent pregnancy. Osteoporosis and menopause Osteoporosis is a disease in which the bones lose minerals and strength with aging. This can result in bone fractures. If you are 79 years old or older, or if you are at risk for osteoporosis and fractures, ask your health care provider if you should: Be screened for bone loss. Take a calcium or vitamin D supplement to lower your risk of fractures. Be given hormone replacement therapy (HRT) to treat symptoms of menopause. Follow these instructions at home: Alcohol use Do not drink alcohol if: Your health care provider tells you not to drink. You are pregnant, may be pregnant, or are planning to become pregnant. If you drink alcohol: Limit how much you have to: 0-1 drink a day. Know how much alcohol is in your drink. In the [place] one drink equals one 12 oz bottle of beer (355 mL), one 5 oz glass of wine (148 mL), or one 1 oz glass of hard liquor (44 mL). Lifestyle Do not use any products that contain nicotine or tobacco. These products include cigarettes, chewing tobacco, and vaping devices, such as e-cigarettes. If you need help quitting, ask your health care provider. Do not use street drugs. Do not share needles. Ask your health care provider for help if you need support or information about quitting drugs. General instructions Schedule regular health, dental, and eye exams. Stay current with your vaccines. Tell your health care provider if: You often feel depressed. You have ever been abused  or do not feel safe at home. Summary Adopting a healthy lifestyle and getting preventive care are important in promoting health and wellness. Follow your health care provider's instructions about healthy diet, exercising, and getting tested or screened for diseases. Follow your health care provider's instructions on monitoring your cholesterol and blood pressure. This information is not intended to replace advice given to you by your health care provider. Make sure you discuss any questions you have with your health care provider. Document Revised: 12/25/2020 Document Reviewed: 12/25/2020 Elsevier Patient Education  [place].

## 2021-09-27 ENCOUNTER — Ambulatory Visit (INDEPENDENT_AMBULATORY_CARE_PROVIDER_SITE_OTHER): Payer: Medicare HMO | Admitting: Internal Medicine

## 2021-09-27 VITALS — BP 118/76 | HR 77 | Temp 97.9°F | Ht 60.0 in

## 2021-09-27 DIAGNOSIS — N1831 Chronic kidney disease, stage 3a: Secondary | ICD-10-CM

## 2021-09-27 DIAGNOSIS — E782 Mixed hyperlipidemia: Secondary | ICD-10-CM

## 2021-09-27 DIAGNOSIS — G479 Sleep disorder, unspecified: Secondary | ICD-10-CM | POA: Diagnosis not present

## 2021-09-27 DIAGNOSIS — M85869 Other specified disorders of bone density and structure, unspecified lower leg: Secondary | ICD-10-CM

## 2021-09-27 DIAGNOSIS — Z0000 Encounter for general adult medical examination without abnormal findings: Secondary | ICD-10-CM | POA: Diagnosis not present

## 2021-09-27 DIAGNOSIS — R739 Hyperglycemia, unspecified: Secondary | ICD-10-CM | POA: Diagnosis not present

## 2021-09-27 DIAGNOSIS — E038 Other specified hypothyroidism: Secondary | ICD-10-CM

## 2021-09-27 DIAGNOSIS — R002 Palpitations: Secondary | ICD-10-CM | POA: Diagnosis not present

## 2021-09-27 DIAGNOSIS — I700 Atherosclerosis of aorta: Secondary | ICD-10-CM | POA: Diagnosis not present

## 2021-09-27 LAB — LIPID PANEL
Cholesterol: 192 mg/dL (ref 0–200)
HDL: 53.6 mg/dL (ref >39.00)
LDL Cholesterol: 110 mg/dL — ABNORMAL HIGH (ref 0–99)
NonHDL: 138.04
Total CHOL/HDL Ratio: 4
Triglycerides: 139 mg/dL (ref 0.0–149.0)
VLDL: 27.8 mg/dL (ref 0.0–40.0)

## 2021-09-27 LAB — CBC WITH DIFFERENTIAL/PLATELET
Basophils Absolute: 0.1 10*3/uL (ref 0.0–0.1)
Basophils Relative: 1 % (ref 0.0–3.0)
Eosinophils Absolute: 0.3 10*3/uL (ref 0.0–0.7)
Eosinophils Relative: 4.2 % (ref 0.0–5.0)
HCT: 42.4 % (ref 36.0–46.0)
Hemoglobin: 14.1 g/dL (ref 12.0–15.0)
Lymphocytes Relative: 29.8 % (ref 12.0–46.0)
Lymphs Abs: 2 10*3/uL (ref 0.7–4.0)
MCHC: 33.3 g/dL (ref 30.0–36.0)
MCV: 89.1 fl (ref 78.0–100.0)
Monocytes Absolute: 0.8 10*3/uL (ref 0.1–1.0)
Monocytes Relative: 12.2 % — ABNORMAL HIGH (ref 3.0–12.0)
Neutro Abs: 3.6 10*3/uL (ref 1.4–7.7)
Neutrophils Relative %: 52.8 % (ref 43.0–77.0)
Platelets: 202 10*3/uL (ref 150.0–400.0)
RBC: 4.76 Mil/uL (ref 3.87–5.11)
RDW: 12.2 % (ref 11.5–15.5)
WBC: 6.8 10*3/uL (ref 4.0–10.5)

## 2021-09-27 LAB — COMPREHENSIVE METABOLIC PANEL
ALT: 42 U/L — ABNORMAL HIGH (ref 0–35)
AST: 33 U/L (ref 0–37)
Albumin: 4 g/dL (ref 3.5–5.2)
Alkaline Phosphatase: 71 U/L (ref 39–117)
BUN: 19 mg/dL (ref 6–23)
CO2: 34 mEq/L — ABNORMAL HIGH (ref 19–32)
Calcium: 10.3 mg/dL (ref 8.4–10.5)
Chloride: 103 mEq/L (ref 96–112)
Creatinine, Ser: 0.95 mg/dL (ref 0.40–1.20)
GFR: 57.07 mL/min — ABNORMAL LOW (ref >60.00)
Glucose, Bld: 87 mg/dL (ref 70–99)
Potassium: 4.1 mEq/L (ref 3.5–5.1)
Sodium: 140 mEq/L (ref 135–145)
Total Bilirubin: 0.4 mg/dL (ref 0.2–1.2)
Total Protein: 7.1 g/dL (ref 6.0–8.3)

## 2021-09-27 LAB — TSH: TSH: 0.11 u[IU]/mL — ABNORMAL LOW (ref 0.35–5.50)

## 2021-09-27 LAB — HEMOGLOBIN A1C: Hgb A1c MFr Bld: 5.9 % (ref 4.6–6.5)

## 2021-09-27 LAB — VITAMIN D 25 HYDROXY (VIT D DEFICIENCY, FRACTURES): VITD: 43.6 ng/mL (ref 30.00–100.00)

## 2021-09-27 MED ORDER — LEVOTHYROXINE SODIUM 88 MCG PO TABS: 88.0000 ug | ORAL_TABLET | Freq: Every day | ORAL | 1 refills | Status: DC

## 2021-09-27 MED ORDER — TRAZODONE HCL 50 MG PO TABS: 25.0000 mg | ORAL_TABLET | Freq: Every evening | ORAL | 3 refills | Status: DC | PRN

## 2021-09-27 NOTE — Assessment & Plan Note (Addendum)
Chronic CMP Discussed the importance of good amount of water intake, avoiding NSAIDs which she does, keeping blood pressure well controlled, which it is in avoiding medications that hurt the kidneys

## 2021-09-27 NOTE — Assessment & Plan Note (Signed)
Chronic Controlled, Stable Continue atenolol 25 mg daily

## 2021-09-27 NOTE — Assessment & Plan Note (Signed)
Chronic-she is always had issues with sleep, but her issues have worsened since having all of her foot problems She probably gets 4-5 hours of interrupted sleep Does not feel that tired during the day, which is good but she still wants to try something for sleep to get better quality sleep Discussed options OTC medications have not worked well Trial of trazodone 25-50 mg at bedtime-hopefully she will need this long-term

## 2021-09-27 NOTE — Assessment & Plan Note (Addendum)
Chronic Not currently on any medication Check lipid panel-LDL in the past has not been at goal We will see what LDL is today-should consider statin Encouraged heart healthy diet, regular exercise

## 2021-09-27 NOTE — Addendum Note (Signed)
Addended by: Binnie Rail on: 09/27/2021 04:00 PM   Modules accepted: Orders

## 2021-09-27 NOTE — Assessment & Plan Note (Addendum)
Chronic Regular exercise and healthy diet encouraged Check lipid panel Has some mild aortic atherosclerosis Diet controlled Depending on LDL will recommend low-dose statin

## 2021-09-27 NOTE — Assessment & Plan Note (Addendum)
Chronic DEXA due-ordered Continue calcium and vitamin D with vitamin K daily Stressed regular exercise Check vitamin D level

## 2021-09-27 NOTE — Assessment & Plan Note (Signed)
Chronic  Clinically euthyroid Currently taking levothyroxine 88 mcg daily Check tsh  Titrate med dose if needed  

## 2021-09-27 NOTE — Assessment & Plan Note (Signed)
Chronic Check a1c Low sugar / carb diet Stressed regular exercise  

## 2021-10-01 ENCOUNTER — Other Ambulatory Visit: Payer: Self-pay

## 2021-10-01 ENCOUNTER — Telehealth: Payer: Self-pay

## 2021-10-01 DIAGNOSIS — Z1211 Encounter for screening for malignant neoplasm of colon: Secondary | ICD-10-CM

## 2021-10-01 NOTE — Telephone Encounter (Signed)
Cologuard Order [id number]

## 2021-10-01 NOTE — Telephone Encounter (Signed)
-----   Message from Binnie Rail, MD sent at 09/27/2021  9:16 PM EST ----- She would like to do a cologuard - can you have one sent to her please

## 2021-10-02 ENCOUNTER — Other Ambulatory Visit: Payer: Self-pay

## 2021-10-02 ENCOUNTER — Ambulatory Visit: Payer: Medicare HMO

## 2021-10-02 DIAGNOSIS — R262 Difficulty in walking, not elsewhere classified: Secondary | ICD-10-CM | POA: Diagnosis not present

## 2021-10-02 DIAGNOSIS — M25571 Pain in right ankle and joints of right foot: Secondary | ICD-10-CM | POA: Diagnosis not present

## 2021-10-02 DIAGNOSIS — R2689 Other abnormalities of gait and mobility: Secondary | ICD-10-CM

## 2021-10-02 DIAGNOSIS — M722 Plantar fascial fibromatosis: Secondary | ICD-10-CM | POA: Diagnosis not present

## 2021-10-02 DIAGNOSIS — M6281 Muscle weakness (generalized): Secondary | ICD-10-CM | POA: Diagnosis not present

## 2021-10-02 DIAGNOSIS — M7671 Peroneal tendinitis, right leg: Secondary | ICD-10-CM | POA: Diagnosis not present

## 2021-10-02 NOTE — Therapy (Signed)
OUTPATIENT PHYSICAL THERAPY TREATMENT NOTE   Patient Name: Emily [name] MRN: [id number] DOB:03-13-1941, 81 y.o., female Today's Date: 10/02/2021  PCP: Binnie Rail, MD REFERRING PROVIDER: Criselda Peaches, DPM   PT End of Session - 10/02/21 1300     Visit Number 2    Number of Visits 17    Date for PT Re-Evaluation 11/21/21    Authorization Type [employer] MCR    Authorization Time Period FOTO v6, v10; KX mod at v15    Progress Note Due on Visit 10    PT Start Time 1300    PT Stop Time 1341    PT Time Calculation (min) 41 min    Activity Tolerance Patient tolerated treatment well    Behavior During Therapy WFL for tasks assessed/performed             Past Medical History:  Diagnosis Date   Actinic keratosis    Allergic rhinitis    BACK PAIN    CARCINOMA, BREAST, 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   L s/p lumpectomy, chemo and XRT   Cervicalgia    Cholelithiasis    DIVERTICULOSIS, COLON    ENDOMETRIOSIS    GERD (gastroesophageal reflux disease)    Heart palpitations    Hypertension    HYPOTHYROIDISM    Left wrist fracture 11/06/2016   10/2016 - fell of high stool   Personal history of chemotherapy    Personal history of radiation therapy    Pleural plaque without asbestos    CT chest 10/2010: R>L lobes, upper> lower   Squamous cell carcinoma    History of BC   Past Surgical History:  Procedure Laterality Date   BREAST LUMPECTOMY  2001   left   CATARACT EXTRACTION Bilateral 09/2014   both eyes   CHOLECYSTECTOMY  2010   ENDOMETRIAL ABLATION     EXCISION / BIOPSY BREAST / NIPPLE / DUCT Left 2001   Patient Active Problem List   Diagnosis Date Noted   Sleep difficulties 09/27/2021   Cervicogenic headache 04/10/2021   Acute bronchitis 04/10/2021   Elevated blood pressure reading 04/10/2021   Skin abnormality 02/27/2021   Aortic atherosclerosis ([place]) 09/22/2020   Urinary urgency 03/23/2020   CKD (chronic kidney disease) stage 3, GFR 30-59 ml/min (HCC)  03/22/2020   Hyperglycemia 03/22/2020   Change in stool 11/17/2019   Right-sided chest wall pain 11/17/2019   Urinary retention 09/23/2019   SOB (shortness of breath) 01/15/2019   Upper airway cough syndrome 01/15/2019   Belching 12/14/2018   Reactive airway disease 12/05/2018   Tear of left rotator cuff 10/05/2018   Pre-syncope 05/19/2018   DDD (degenerative disc disease), cervical 01/15/2018   Neuralgia 09/12/2017   Presbycusis of both ears 12/11/2016   Memory difficulties 11/06/2016   IBS (irritable bowel syndrome) 04/16/2016   Hyperlipidemia 09/10/2015   Osteopenia 09/08/2015   Palpitations    Pleural plaque without asbestos 12/03/2010   Chronic neck pain 12/18/2009   DIVERTICULOSIS, COLON 03/02/2009   Lumbago 03/11/2008   Actinic keratosis 01/23/2008   CARCINOMA, BREAST, ESTROGEN RECEPTOR NEGATIVE 10/12/2007   Hypothyroidism 06/18/2007   Allergic rhinitis 06/11/2007    REFERRING DIAG:  M76.71 (ICD-10-CM) - Peroneal tendinitis of right lower extremity  M72.2 (ICD-10-CM) - Plantar fasciitis of right foot    THERAPY DIAG:  Pain in right ankle and joints of right foot  Difficulty in walking, not elsewhere classified  Muscle weakness (generalized)  Other abnormalities of gait and mobility  PERTINENT HISTORY: TOPAZ procedure and PRP  injection to Rt peroneal tendon on 09/07/2021  PRECAUTIONS: None  ONSET DATE: 04/2021  WEIGHT BEARING RESTRICTIONS Yes WBAT  SUBJECTIVE: Today's the first day I'm out without the boot on, it feels a bit sore. I do my exercises everyday and sometimes I'll ice.   PAIN:  Are you having pain? Yes NPRS scale: 4/10 Worst: 8/10 Pain location: Rt lateral foot Pain description: intermittent, burning, and aching  Aggravating factors: Prolonged standing/ walking >5 minutes, sleeping Relieving factors: Rest    OBJECTIVE:    DIAGNOSTIC FINDINGS: 06/30/2021: MR Ankle Right without Contrast:  IMPRESSION: 1. Insertional lateral cord  plantar fasciopathy at the base of the fifth metatarsal. 2. Short segment split tear of the peroneal brevis tendon inferior to the lateral malleolus.   PATIENT SURVEYS:  FOTO 51%, projected 61% in 12 visits   COGNITION:          Overall cognitive status: Within functional limits for tasks assessed                        SENSATION:          Light touch: Appears intact   MUSCLE LENGTH: Gastrocnemius: moderately tight BIL Soleus: moderately tight BIL Hamstring 90/90: 30 degrees shy BIL   POSTURE:  WNL, BIL pes planus   PALPATION: TTP to lateral Rt foot along 5th metatarsal and along peroneal tendons.   LE AROM/PROM:   A/PROM Right 09/26/2021 Left 09/26/2021  Ankle dorsiflexion 0/10 5/10  Ankle plantarflexion 55/55 55/55  Ankle inversion 25/40p! 25/45  Ankle eversion 25/40p! 25/40   (Blank rows = not tested)   LE MMT:   MMT Right 09/26/2021 Left 09/26/2021  Hip flexion 4+/5 4+/5  Hip extension      Hip abduction      Knee flexion 5/5 5/5  Knee extension 5/5 5/5  Ankle dorsiflexion 4+/5 5/5  Ankle plantarflexion 4+/5 5/5  Ankle inversion 4/5p! 5/5  Ankle eversion 4/5p! 5/5   (Blank rows = not tested)   FUNCTIONAL TESTS:  Squat: WNL DL heel raise: x10 WNL   GAIT: Distance walked: 20 Assistive device utilized: None Level of assistance: Complete Independence Comments: mild antalgic gait, BIL flat feet, mid-foot strike BIL       TODAY'S TREATMENT: [facility] Adult PT Treatment:                                                DATE: 10/02/2021 Therapeutic Exercise: Supine Peroneal Nerve Glide 2 x 10 on Rt Seated Ankle Inversion/Eversion with RTB Resistance x 10 each on Rt Ankle Inversion Eversion Towel Slide 2 x 10 on Rt (tactile cues for form) Towel scrunch Rt x 20 Seated Heel Raise with handhold resistance 2 x 10  Seated march 2 x 10 BIL Seated Calf Stretch with Strap 2 x 30sec on Rt BAPS level 3 ankle PF/DF 2 x 10 BAPS circles CW/CCW x10 ea Supine hamstring/calf  stretch with strap Rt 2 x 30 sec  Standing calf raise at FM 2 x 10 Therapeutic Activity: Gait x 185 ft encouraging WBAT   [facility] Adult PT Treatment:  DATE: 09/26/2021 Therapeutic Exercise: Supine Peroneal Nerve Glide x10 on Rt Seated Ankle Eversion with YTB Resistance x10 on Rt Ankle Inversion Eversion Towel Slide x10 on Rt Seated Heel Raise with HANDHOLD RESISTANCE x10  Seated Calf Stretch with Strap x30sec on Rt Manual Therapy: N/A Neuromuscular re-ed: N/A Therapeutic Activity: N/A Modalities: N/A Self Care: N/A       PATIENT EDUCATION:  Education details: Educated on POC, prognosis, and HEP Person educated: Patient Education method: Consulting civil engineer, Media planner, and Handouts Education comprehension: verbalized understanding and returned demonstration     HOME EXERCISE PROGRAM: Access Code: [email or web address] URL: [email or web address] Date: 09/26/2021 Prepared by: Vanessa [place]   Exercises Supine Peroneal Nerve Glide - 1 x daily - 7 x weekly - 3 sets - 15 reps Seated Ankle Eversion with Resistance - 1 x daily - 7 x weekly - 3 sets - 10 reps - 3-sec hold Ankle Inversion Eversion Towel Slide - 1 x daily - 7 x weekly - 2 sets - 10 reps Seated Heel Raise with HANDHOLD RESISTANCE - 1 x daily - 7 x weekly - 3 sets - 10 reps - 3-sec hold Seated Calf Stretch with Strap - 1 x daily - 7 x weekly - 2 sets - 1-min hold     ASSESSMENT:   CLINICAL IMPRESSION: Patient presents to PT with pain in her R ankle and foot and not wearing her cam boot stating she is trying to ween off of it. Verbal cues for heel/toe during ambulation with good carryover. Trialed standing calf raises with no increase in pain. Incorporate more standing exercises into next session. Patient continues to benefit from skilled PT services and should be progressed as able to improve functional independence.    REHAB POTENTIAL: Good   CLINICAL DECISION  MAKING: Stable/uncomplicated   EVALUATION COMPLEXITY: Low     GOALS: Goals reviewed with patient? Yes   SHORT TERM GOALS:   STG Name Target Date Goal status  1 Pt will report understanding and adherence to her HEP in order to promote independence in the management of her primary impairments. Baseline: HEP provided at eval 10/24/2021 INITIAL    LONG TERM GOALS:    LTG Name Target Date Goal status  1 Pt will achieve a FOTO score of 61% in order to demonstrate improved functional ability as it relates to her lateral foot pain. Baseline: 51% 11/21/2021 INITIAL  2 Pt will achieve BIL ankle dorsiflexion AROM of 10 degrees in order to promote heel strike during functional gait. Baseline: 0d on Rt, 5d on Lt 11/21/2021 INITIAL  3 Pt will report ability to walk 30 minutes without a boot and with 0-2/10 pain in order to grocery shop with less limitation. Baseline: >5/10 pain after 5 minutes of walking 11/21/2021 INITIAL  4 Pt will report ability to sleep through the night without being woken due to pain in order to be well-rested for ADLs. Baseline: Pt woken several times each night due to pain. 11/21/2021 INITIAL  5 Pt will achieve global Rt ankle strength of 5/5 without pain in order to walk on uneven surfaces with good motor control. Baseline: See MMT chart 11/21/2021 INITIAL    PLAN: PT FREQUENCY: 2x/week   PT DURATION: 8 weeks   PLANNED INTERVENTIONS: Therapeutic exercises, Therapeutic activity, Neuro Muscular re-education, Balance training, Gait training, Patient/Family education, Joint mobilization, Stair training, Dry Needling, Electrical stimulation, Cryotherapy, Taping, Vasopneumatic device, and Manual therapy   PLAN FOR NEXT SESSION: Progress peroneal/ posterior tibialis, calf strengthening as able, desensitization techniques, avoid  heat due to hx of cancer    Evelene Croon, PTA 10/02/2021, 1:51 PM

## 2021-10-04 ENCOUNTER — Other Ambulatory Visit: Payer: Self-pay

## 2021-10-04 ENCOUNTER — Ambulatory Visit: Payer: Medicare HMO

## 2021-10-04 DIAGNOSIS — M6281 Muscle weakness (generalized): Secondary | ICD-10-CM

## 2021-10-04 DIAGNOSIS — R262 Difficulty in walking, not elsewhere classified: Secondary | ICD-10-CM

## 2021-10-04 DIAGNOSIS — M25571 Pain in right ankle and joints of right foot: Secondary | ICD-10-CM | POA: Diagnosis not present

## 2021-10-04 DIAGNOSIS — R2689 Other abnormalities of gait and mobility: Secondary | ICD-10-CM

## 2021-10-04 DIAGNOSIS — M7671 Peroneal tendinitis, right leg: Secondary | ICD-10-CM | POA: Diagnosis not present

## 2021-10-04 DIAGNOSIS — M722 Plantar fascial fibromatosis: Secondary | ICD-10-CM | POA: Diagnosis not present

## 2021-10-04 NOTE — Therapy (Signed)
OUTPATIENT PHYSICAL THERAPY TREATMENT NOTE   Patient Name: Emily [name] MRN: [id number] DOB:[birth year], 81 y.o., female Today's Date: 10/04/2021  PCP: Binnie Rail, MD REFERRING PROVIDER: Criselda Peaches, DPM   PT End of Session - 10/04/21 1000     Visit Number 3    Number of Visits 17    Date for PT Re-Evaluation 11/21/21    Authorization Type [employer] MCR    Authorization Time Period FOTO v6, v10; KX mod at [place] Note Due on Visit 10    PT Start Time 1000    PT Stop Time 1042    PT Time Calculation (min) 42 min    Activity Tolerance Patient tolerated treatment well    Behavior During Therapy WFL for tasks assessed/performed              Past Medical History:  Diagnosis Date   Actinic keratosis    Allergic rhinitis    BACK PAIN    CARCINOMA, BREAST, 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   L s/p lumpectomy, chemo and XRT   Cervicalgia    Cholelithiasis    DIVERTICULOSIS, COLON    ENDOMETRIOSIS    GERD (gastroesophageal reflux disease)    Heart palpitations    Hypertension    HYPOTHYROIDISM    Left wrist fracture 11/06/2016   10/2016 - fell of high stool   Personal history of chemotherapy    Personal history of radiation therapy    Pleural plaque without asbestos    CT chest 10/2010: R>L lobes, upper> lower   Squamous cell carcinoma    History of BC   Past Surgical History:  Procedure Laterality Date   BREAST LUMPECTOMY  2001   left   CATARACT EXTRACTION Bilateral 09/2014   both eyes   CHOLECYSTECTOMY  2010   ENDOMETRIAL ABLATION     EXCISION / BIOPSY BREAST / NIPPLE / DUCT Left 2001   Patient Active Problem List   Diagnosis Date Noted   Sleep difficulties 09/27/2021   Cervicogenic headache 04/10/2021   Acute bronchitis 04/10/2021   Elevated blood pressure reading 04/10/2021   Skin abnormality 02/27/2021   Aortic atherosclerosis ([place]) 09/22/2020   Urinary urgency 03/23/2020   CKD (chronic kidney disease) stage 3, GFR 30-59 ml/min (HCC)  03/22/2020   Hyperglycemia 03/22/2020   Change in stool 11/17/2019   Right-sided chest wall pain 11/17/2019   Urinary retention 09/23/2019   SOB (shortness of breath) 01/15/2019   Upper airway cough syndrome 01/15/2019   Belching 12/14/2018   Reactive airway disease 12/05/2018   Tear of left rotator cuff 10/05/2018   Pre-syncope 05/19/2018   DDD (degenerative disc disease), cervical 01/15/2018   Neuralgia 09/12/2017   Presbycusis of both ears 12/11/2016   Memory difficulties 11/06/2016   IBS (irritable bowel syndrome) 04/16/2016   Hyperlipidemia 09/10/2015   Osteopenia 09/08/2015   Palpitations    Pleural plaque without asbestos 12/03/2010   Chronic neck pain 12/18/2009   DIVERTICULOSIS, COLON 03/02/2009   Lumbago 03/11/2008   Actinic keratosis 01/23/2008   CARCINOMA, BREAST, ESTROGEN RECEPTOR NEGATIVE 10/12/2007   Hypothyroidism 06/18/2007   Allergic rhinitis 06/11/2007    REFERRING DIAG:  M76.71 (ICD-10-CM) - Peroneal tendinitis of right lower extremity  M72.2 (ICD-10-CM) - Plantar fasciitis of right foot    THERAPY DIAG:  Pain in right ankle and joints of right foot  Difficulty in walking, not elsewhere classified  Muscle weakness (generalized)  Other abnormalities of gait and mobility  PERTINENT HISTORY: TOPAZ procedure and  PRP injection to Rt peroneal tendon on 09/07/2021  PRECAUTIONS: None  ONSET DATE: 04/2021  WEIGHT BEARING RESTRICTIONS Yes WBAT  SUBJECTIVE: I keep getting a shooting pain in my foot and it happens randomly. I'm only wearing my boot when I walk long distances like to the grocery store, otherwise I'm wearing my tennis shoes.  PAIN:  Are you having pain? Yes NPRS scale: 2/10 Worst: 9/10 Pain location: Rt lateral foot Pain description: intermittent, burning, and aching  Aggravating factors: Prolonged standing/ walking >5 minutes, sleeping Relieving factors: Rest    OBJECTIVE:    DIAGNOSTIC FINDINGS: 06/30/2021: MR Ankle Right  without Contrast:  IMPRESSION: 1. Insertional lateral cord plantar fasciopathy at the base of the fifth metatarsal. 2. Short segment split tear of the peroneal brevis tendon inferior to the lateral malleolus.   PATIENT SURVEYS:  FOTO 51%, projected 61% in 12 visits   COGNITION:          Overall cognitive status: Within functional limits for tasks assessed                        SENSATION:          Light touch: Appears intact   MUSCLE LENGTH: Gastrocnemius: moderately tight BIL Soleus: moderately tight BIL Hamstring 90/90: 30 degrees shy BIL   POSTURE:  WNL, BIL pes planus   PALPATION: TTP to lateral Rt foot along 5th metatarsal and along peroneal tendons.   LE AROM/PROM:   A/PROM Right 09/26/2021 Left 09/26/2021  Ankle dorsiflexion 0/10 5/10  Ankle plantarflexion 55/55 55/55  Ankle inversion 25/40p! 25/45  Ankle eversion 25/40p! 25/40   (Blank rows = not tested)   LE MMT:   MMT Right 09/26/2021 Left 09/26/2021  Hip flexion 4+/5 4+/5  Hip extension      Hip abduction      Knee flexion 5/5 5/5  Knee extension 5/5 5/5  Ankle dorsiflexion 4+/5 5/5  Ankle plantarflexion 4+/5 5/5  Ankle inversion 4/5p! 5/5  Ankle eversion 4/5p! 5/5   (Blank rows = not tested)   FUNCTIONAL TESTS:  Squat: WNL DL heel raise: x10 WNL   GAIT: Distance walked: 20 Assistive device utilized: None Level of assistance: Complete Independence Comments: mild antalgic gait, BIL flat feet, mid-foot strike BIL       TODAY'S TREATMENT: [facility] Adult PT Treatment:                                                DATE: 10/04/2021 Therapeutic Exercise: Nusteplevel 6 x 5 mins Supine Peroneal Nerve Glide 2 x 10 on Rt Seated Ankle Inversion/Eversion with RTB Resistance x 10 each on Rt Long sitting PF/DF RTB x 10 ea Rt Towel scrunch Rt 1 x 60", 1 x 30" (painful after 60") Seated Calf Stretch with RTB 2 x 30sec on Rt Supine hamstring/calf stretch with RTB Rt 2 x 30 sec  Standing calf stretch 2 x 30"   Standing blue rocker board PF/DF/Inv/Ev x 10 ea Standing calf raise 2 x 10 Hip flexion 12.5# x 10 BIL Therapeutic Activity: Gait 2 x 185 ft encouraging heel/toe    [facility] Adult PT Treatment:  DATE: 10/02/2021 Therapeutic Exercise: Supine Peroneal Nerve Glide 2 x 10 on Rt Seated Ankle Inversion/Eversion with RTB Resistance x 10 each on Rt Ankle Inversion Eversion Towel Slide 2 x 10 on Rt (tactile cues for form) Towel scrunch Rt x 20 Seated Heel Raise with handhold resistance 2 x 10  Seated march 2 x 10 BIL Seated Calf Stretch with Strap 2 x 30sec on Rt BAPS level 3 ankle PF/DF 2 x 10 BAPS circles CW/CCW x10 ea Supine hamstring/calf stretch with strap Rt 2 x 30 sec  Standing calf raise at FM 2 x 10 Therapeutic Activity: Gait x 185 ft encouraging WBAT   [facility] Adult PT Treatment:                                                DATE: 09/26/2021 Therapeutic Exercise: Supine Peroneal Nerve Glide x10 on Rt Seated Ankle Eversion with YTB Resistance x10 on Rt Ankle Inversion Eversion Towel Slide x10 on Rt Seated Heel Raise with HANDHOLD RESISTANCE x10  Seated Calf Stretch with Strap x30sec on Rt Manual Therapy: N/A Neuromuscular re-ed: N/A Therapeutic Activity: N/A Modalities: N/A Self Care: N/A       PATIENT EDUCATION:  Education details: Assessed HEP, Educated on POC, prognosis, and HEP Person educated: Patient Education method: Consulting civil engineer, Demonstration, and Handouts Education comprehension: verbalized understanding and returned demonstration     HOME EXERCISE PROGRAM: Access Code: [email or web address] URL: [email or web address] Date: 09/26/2021 Prepared by: Vanessa [place]   Exercises Supine Peroneal Nerve Glide - 1 x daily - 7 x weekly - 3 sets - 15 reps Seated Ankle Eversion with Resistance - 1 x daily - 7 x weekly - 3 sets - 10 reps - 3-sec hold Ankle Inversion Eversion Towel Slide - 1 x daily - 7 x weekly - 2 sets  - 10 reps Seated Heel Raise with HANDHOLD RESISTANCE - 1 x daily - 7 x weekly - 3 sets - 10 reps - 3-sec hold Seated Calf Stretch with Strap - 1 x daily - 7 x weekly - 2 sets - 1-min hold     ASSESSMENT:   CLINICAL IMPRESSION: Patient reports some mild muscular pain in her arms after last session that she believes was from holding the strap during stretches, utilized RTB during stretches this session per patient request and she reported she felt less tension in her arms at end of session. Incorporated more standing exercises this session with no reports of increased pain. Patient continues to benefit from skilled PT services and should be progressed as able to improve functional independence.    REHAB POTENTIAL: Good   CLINICAL DECISION MAKING: Stable/uncomplicated   EVALUATION COMPLEXITY: Low     GOALS: Goals reviewed with patient? Yes   SHORT TERM GOALS:   STG Name Target Date Goal status  1 Pt will report understanding and adherence to her HEP in order to promote independence in the management of her primary impairments. Baseline: HEP provided at eval 10/24/2021 MET    LONG TERM GOALS:    LTG Name Target Date Goal status  1 Pt will achieve a FOTO score of 61% in order to demonstrate improved functional ability as it relates to her lateral foot pain. Baseline: 51% 11/21/2021 INITIAL  2 Pt will achieve BIL ankle dorsiflexion AROM of 10 degrees in order to promote heel strike during functional gait. Baseline: 0d on  Rt, 5d on Lt 11/21/2021 INITIAL  3 Pt will report ability to walk 30 minutes without a boot and with 0-2/10 pain in order to grocery shop with less limitation. Baseline: >5/10 pain after 5 minutes of walking 11/21/2021 INITIAL  4 Pt will report ability to sleep through the night without being woken due to pain in order to be well-rested for ADLs. Baseline: Pt woken several times each night due to pain. 11/21/2021 INITIAL  5 Pt will achieve global Rt ankle strength of 5/5  without pain in order to walk on uneven surfaces with good motor control. Baseline: See MMT chart 11/21/2021 INITIAL    PLAN: PT FREQUENCY: 2x/week   PT DURATION: 8 weeks   PLANNED INTERVENTIONS: Therapeutic exercises, Therapeutic activity, Neuro Muscular re-education, Balance training, Gait training, Patient/Family education, Joint mobilization, Stair training, Dry Needling, Electrical stimulation, Cryotherapy, Taping, Vasopneumatic device, and Manual therapy   PLAN FOR NEXT SESSION: Progress peroneal/ posterior tibialis, calf strengthening as able, desensitization techniques, avoid heat due to hx of cancer, measure ankle ROM next session    Evelene Croon, PTA 10/04/2021, 10:45 AM

## 2021-10-05 ENCOUNTER — Encounter: Payer: Self-pay | Admitting: Podiatry

## 2021-10-06 NOTE — Therapy (Incomplete)
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  ° L s/p lumpectomy, chemo and XRT  ° Cervicalgia   ° Cholelithiasis   ° DIVERTICULOSIS, COLON   ° ENDOMETRIOSIS   ° GERD (gastroesophageal reflux disease)   ° Heart palpitations   ° Hypertension   ° HYPOTHYROIDISM   ° Left wrist fracture 11/06/2016  ° 10/2016 - fell of high stool  ° Personal history of chemotherapy   ° Personal history of radiation therapy   ° Pleural plaque without asbestos   ° CT chest 10/2010: R>L lobes, upper> lower  ° Squamous cell carcinoma   ° History of BC  ° °Past Surgical History:  °Procedure Laterality Date  ° BREAST LUMPECTOMY  2001  ° left  ° CATARACT EXTRACTION Bilateral 09/2014  ° both eyes  ° CHOLECYSTECTOMY  2010  ° ENDOMETRIAL ABLATION    ° EXCISION / BIOPSY BREAST / NIPPLE / DUCT Left 2001  ° °Patient Active Problem List  ° Diagnosis Date Noted  ° Sleep difficulties 09/27/2021  ° Cervicogenic headache 04/10/2021  ° Acute bronchitis 04/10/2021  ° Elevated blood pressure reading 04/10/2021  ° Skin abnormality 02/27/2021  ° Aortic atherosclerosis (HCC) 09/22/2020  ° Urinary urgency 03/23/2020  ° CKD (chronic kidney disease) stage 3, GFR 30-59 ml/min (HCC) 03/22/2020  ° Hyperglycemia 03/22/2020  ° Change in stool 11/17/2019  ° Right-sided chest wall pain 11/17/2019  ° Urinary retention 09/23/2019  ° SOB (shortness of breath) 01/15/2019  ° Upper airway cough syndrome 01/15/2019  ° Belching 12/14/2018  ° Reactive airway disease 12/05/2018  ° Tear of left rotator cuff 10/05/2018  ° Pre-syncope 05/19/2018  ° DDD (degenerative disc disease), cervical 01/15/2018  ° Neuralgia 09/12/2017  ° Presbycusis of both  ears 12/11/2016  ° Memory difficulties 11/06/2016  ° IBS (irritable bowel syndrome) 04/16/2016  ° Hyperlipidemia 09/10/2015  ° Osteopenia 09/08/2015  ° Palpitations   ° Pleural plaque without asbestos 12/03/2010  ° Chronic neck pain 12/18/2009  ° DIVERTICULOSIS, COLON 03/02/2009  ° Lumbago 03/11/2008  ° Actinic keratosis 01/23/2008  ° CARCINOMA, BREAST, ESTROGEN RECEPTOR NEGATIVE 10/12/2007  ° Hypothyroidism 06/18/2007  ° Allergic rhinitis 06/11/2007  ° ° °REFERRING DIAG:  °M76.71 (ICD-10-CM) - Peroneal tendinitis of right lower extremity  °M72.2 (ICD-10-CM) - Plantar fasciitis of right foot  ° ° °THERAPY DIAG:  °No diagnosis found. ° °PERTINENT HISTORY: TOPAZ procedure and PRP injection to Rt peroneal tendon on 09/07/2021 ° °PRECAUTIONS: None ° °ONSET DATE: 04/2021 ° °WEIGHT BEARING RESTRICTIONS Yes WBAT ° °SUBJECTIVE: *** ° °PAIN:  °Are you having pain? Yes °NPRS scale: 2/10 °Worst: 9/10 °Pain location: Rt lateral foot °Pain description: intermittent, burning, and aching  °Aggravating factors: Prolonged standing/ walking >5 minutes, sleeping °Relieving factors: Rest ° ° ° °OBJECTIVE:  °  °DIAGNOSTIC FINDINGS: 06/30/2021: MR Ankle Right without Contrast:  °IMPRESSION: °1. Insertional lateral cord plantar fasciopathy at the base of the °fifth metatarsal. °2. Short segment split tear of the peroneal brevis tendon inferior °to the lateral malleolus. °  °PATIENT SURVEYS:  °FOTO 51%, projected 61% in 12 visits °  °COGNITION: °         Overall cognitive status: Within functional   limits for tasks assessed              °          °SENSATION: °         Light touch: Appears intact °  °MUSCLE LENGTH: °Gastrocnemius: moderately tight BIL °Soleus: moderately tight BIL °Hamstring 90/90: 30 degrees shy BIL °  °POSTURE:  °WNL, BIL pes planus °  °PALPATION: °TTP to lateral Rt foot along 5th metatarsal and along peroneal tendons. °  °LE AROM/PROM: °  °A/PROM Right °09/26/2021 Left °09/26/2021  °Ankle dorsiflexion 0/10 5/10  °Ankle  plantarflexion 55/55 55/55  °Ankle inversion 25/40p! 25/45  °Ankle eversion 25/40p! 25/40  ° (Blank rows = not tested) °  °LE MMT: °  °MMT Right °09/26/2021 Left °09/26/2021  °Hip flexion 4+/5 4+/5  °Hip extension      °Hip abduction      °Knee flexion 5/5 5/5  °Knee extension 5/5 5/5  °Ankle dorsiflexion 4+/5 5/5  °Ankle plantarflexion 4+/5 5/5  °Ankle inversion 4/5p! 5/5  °Ankle eversion 4/5p! 5/5  ° (Blank rows = not tested) °  °FUNCTIONAL TESTS:  °Squat: WNL °DL heel raise: x10 WNL °  °GAIT: °Distance walked: 20 °Assistive device utilized: None °Level of assistance: Complete Independence °Comments: mild antalgic gait, BIL flat feet, mid-foot strike BIL °  °  °  °TODAY'S TREATMENT: ° °[facility] Adult PT Treatment:                                                DATE: 10/09/2021 °Therapeutic Exercise: °*** °Manual Therapy: °*** °Neuromuscular re-ed: °*** °Therapeutic Activity: °*** °Modalities: °*** °Self Care: °*** ° ° °[facility] Adult PT Treatment:                                                DATE: 10/04/2021 °Therapeutic Exercise: °Nusteplevel 6 x 5 mins °Supine Peroneal Nerve Glide 2 x 10 on Rt °Seated Ankle Inversion/Eversion with RTB Resistance x 10 each on Rt °Long sitting PF/DF RTB x 10 ea Rt °Towel scrunch Rt 1 x 60", 1 x 30" (painful after 60") °Seated Calf Stretch with RTB 2 x 30sec on Rt °Supine hamstring/calf stretch with RTB Rt 2 x 30 sec  °Standing calf stretch 2 x 30" ° Standing blue rocker board PF/DF/Inv/Ev x 10 ea °Standing calf raise 2 x 10 °Hip flexion 12.5# x 10 BIL °Therapeutic Activity: °Gait 2 x 185 ft encouraging heel/toe ° ° ° °[facility] Adult PT Treatment:                                                DATE: 10/02/2021 °Therapeutic Exercise: °Supine Peroneal Nerve Glide 2 x 10 on Rt °Seated Ankle Inversion/Eversion with RTB Resistance x 10 each on Rt °Ankle Inversion Eversion Towel Slide 2 x 10 on Rt (tactile cues for form) °Towel scrunch Rt x 20 °Seated Heel Raise with handhold resistance 2 x 10  °Seated  march 2 x 10 BIL °Seated Calf Stretch with Strap 2 x 30sec on Rt °BAPS level 3 ankle PF/DF 2 x 10 °BAPS circles CW/CCW x10 ea °  Supine hamstring/calf stretch with strap Rt 2 x 30 sec  °Standing calf raise at FM 2 x 10 °Therapeutic Activity: °Gait x 185 ft encouraging WBAT ° °  °  °  °PATIENT EDUCATION:  °Education details: Assessed HEP, Educated on POC, prognosis, and HEP °Person educated: Patient °Education method: Explanation, Demonstration, and Handouts °Education comprehension: verbalized understanding and returned demonstration °  °  °HOME EXERCISE PROGRAM: °Access Code: [email or web address] °URL: [email or web address] °Date: 09/26/2021 °Prepared by: Tucker Yarborough °  °Exercises °Supine Peroneal Nerve Glide - 1 x daily - 7 x weekly - 3 sets - 15 reps °Seated Ankle Eversion with Resistance - 1 x daily - 7 x weekly - 3 sets - 10 reps - 3-sec hold °Ankle Inversion Eversion Towel Slide - 1 x daily - 7 x weekly - 2 sets - 10 reps °Seated Heel Raise with HANDHOLD RESISTANCE - 1 x daily - 7 x weekly - 3 sets - 10 reps - 3-sec hold °Seated Calf Stretch with Strap - 1 x daily - 7 x weekly - 2 sets - 1-min hold °  °  °ASSESSMENT: °  °CLINICAL IMPRESSION: °*** ° °  °REHAB POTENTIAL: Good °  °CLINICAL DECISION MAKING: Stable/uncomplicated °  °EVALUATION COMPLEXITY: Low °  °  °GOALS: °Goals reviewed with patient? Yes °  °SHORT TERM GOALS: °  °STG Name Target Date Goal status  °1 Pt will report understanding and adherence to her HEP in order to promote independence in the management of her primary impairments. °Baseline: HEP provided at eval 10/24/2021 MET  °  °LONG TERM GOALS:  °  °LTG Name Target Date Goal status  °1 Pt will achieve a FOTO score of 61% in order to demonstrate improved functional ability as it relates to her lateral foot pain. °Baseline: 51% 11/21/2021 INITIAL  °2 Pt will achieve BIL ankle dorsiflexion AROM of 10 degrees in order to promote heel strike during functional gait. °Baseline: 0d on Rt, 5d on Lt  11/21/2021 INITIAL  °3 Pt will report ability to walk 30 minutes without a boot and with 0-2/10 pain in order to grocery shop with less limitation. °Baseline: >5/10 pain after 5 minutes of walking 11/21/2021 INITIAL  °4 Pt will report ability to sleep through the night without being woken due to pain in order to be well-rested for ADLs. °Baseline: Pt woken several times each night due to pain. 11/21/2021 INITIAL  °5 Pt will achieve global Rt ankle strength of 5/5 without pain in order to walk on uneven surfaces with good motor control. °Baseline: See MMT chart 11/21/2021 INITIAL  °  °PLAN: °PT FREQUENCY: 2x/week °  °PT DURATION: 8 weeks °  °PLANNED INTERVENTIONS: Therapeutic exercises, Therapeutic activity, Neuro Muscular re-education, Balance training, Gait training, Patient/Family education, Joint mobilization, Stair training, Dry Needling, Electrical stimulation, Cryotherapy, Taping, Vasopneumatic device, and Manual therapy °  °PLAN FOR NEXT SESSION: Progress peroneal/ posterior tibialis, calf strengthening as able, desensitization techniques, avoid heat due to hx of cancer, measure ankle ROM next session °  ° °Tucker Ryan Yarborough, PT °10/06/2021, 9:25 AM ° °  ° °

## 2021-10-09 ENCOUNTER — Ambulatory Visit: Payer: Medicare HMO

## 2021-10-11 ENCOUNTER — Ambulatory Visit: Payer: Medicare HMO

## 2021-10-13 NOTE — Therapy (Incomplete)
OUTPATIENT PHYSICAL THERAPY TREATMENT NOTE   Patient Name: Emily [name] MRN: [id number] DOB:1941-08-01, 81 y.o., female Today's Date: 10/13/2021  PCP: Binnie Rail, MD REFERRING PROVIDER: Criselda Peaches, DPM      Past Medical History:  Diagnosis Date   Actinic keratosis    Allergic rhinitis    BACK PAIN    CARCINOMA, BREAST, 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   L s/p lumpectomy, chemo and XRT   Cervicalgia    Cholelithiasis    DIVERTICULOSIS, COLON    ENDOMETRIOSIS    GERD (gastroesophageal reflux disease)    Heart palpitations    Hypertension    HYPOTHYROIDISM    Left wrist fracture 11/06/2016   10/2016 - fell of high stool   Personal history of chemotherapy    Personal history of radiation therapy    Pleural plaque without asbestos    CT chest 10/2010: R>L lobes, upper> lower   Squamous cell carcinoma    History of BC   Past Surgical History:  Procedure Laterality Date   BREAST LUMPECTOMY  2001   left   CATARACT EXTRACTION Bilateral 09/2014   both eyes   CHOLECYSTECTOMY  2010   ENDOMETRIAL ABLATION     EXCISION / BIOPSY BREAST / NIPPLE / DUCT Left 2001   Patient Active Problem List   Diagnosis Date Noted   Sleep difficulties 09/27/2021   Cervicogenic headache 04/10/2021   Acute bronchitis 04/10/2021   Elevated blood pressure reading 04/10/2021   Skin abnormality 02/27/2021   Aortic atherosclerosis ([place]) 09/22/2020   Urinary urgency 03/23/2020   CKD (chronic kidney disease) stage 3, GFR 30-59 ml/min (HCC) 03/22/2020   Hyperglycemia 03/22/2020   Change in stool 11/17/2019   Right-sided chest wall pain 11/17/2019   Urinary retention 09/23/2019   SOB (shortness of breath) 01/15/2019   Upper airway cough syndrome 01/15/2019   Belching 12/14/2018   Reactive airway disease 12/05/2018   Tear of left rotator cuff 10/05/2018   Pre-syncope 05/19/2018   DDD (degenerative disc disease), cervical 01/15/2018   Neuralgia 09/12/2017   Presbycusis of both  ears 12/11/2016   Memory difficulties 11/06/2016   IBS (irritable bowel syndrome) 04/16/2016   Hyperlipidemia 09/10/2015   Osteopenia 09/08/2015   Palpitations    Pleural plaque without asbestos 12/03/2010   Chronic neck pain 12/18/2009   DIVERTICULOSIS, COLON 03/02/2009   Lumbago 03/11/2008   Actinic keratosis 01/23/2008   CARCINOMA, BREAST, ESTROGEN RECEPTOR NEGATIVE 10/12/2007   Hypothyroidism 06/18/2007   Allergic rhinitis 06/11/2007    REFERRING DIAG:  M76.71 (ICD-10-CM) - Peroneal tendinitis of right lower extremity  M72.2 (ICD-10-CM) - Plantar fasciitis of right foot    THERAPY DIAG:  No diagnosis found.  PERTINENT HISTORY: TOPAZ procedure and PRP injection to Rt peroneal tendon on 09/07/2021  PRECAUTIONS: None  ONSET DATE: 04/2021  WEIGHT BEARING RESTRICTIONS Yes WBAT  SUBJECTIVE: ***  PAIN:  Are you having pain? Yes NPRS scale: 2/10 Worst: 9/10 Pain location: Rt lateral foot Pain description: intermittent, burning, and aching  Aggravating factors: Prolonged standing/ walking >5 minutes, sleeping Relieving factors: Rest    OBJECTIVE:    DIAGNOSTIC FINDINGS: 06/30/2021: MR Ankle Right without Contrast:  IMPRESSION: 1. Insertional lateral cord plantar fasciopathy at the base of the fifth metatarsal. 2. Short segment split tear of the peroneal brevis tendon inferior to the lateral malleolus.   PATIENT SURVEYS:  FOTO 51%, projected 61% in 12 visits   COGNITION:          Overall cognitive status: Within functional  limits for tasks assessed                        SENSATION:          Light touch: Appears intact   MUSCLE LENGTH: Gastrocnemius: moderately tight BIL Soleus: moderately tight BIL Hamstring 90/90: 30 degrees shy BIL   POSTURE:  WNL, BIL pes planus   PALPATION: TTP to lateral Rt foot along 5th metatarsal and along peroneal tendons.   LE AROM/PROM:   A/PROM Right 09/26/2021 Left 09/26/2021  Ankle dorsiflexion 0/10 5/10  Ankle  plantarflexion 55/55 55/55  Ankle inversion 25/40p! 25/45  Ankle eversion 25/40p! 25/40   (Blank rows = not tested)   LE MMT:   MMT Right 09/26/2021 Left 09/26/2021  Hip flexion 4+/5 4+/5  Hip extension      Hip abduction      Knee flexion 5/5 5/5  Knee extension 5/5 5/5  Ankle dorsiflexion 4+/5 5/5  Ankle plantarflexion 4+/5 5/5  Ankle inversion 4/5p! 5/5  Ankle eversion 4/5p! 5/5   (Blank rows = not tested)   FUNCTIONAL TESTS:  Squat: WNL DL heel raise: x10 WNL   GAIT: Distance walked: 20 Assistive device utilized: None Level of assistance: Complete Independence Comments: mild antalgic gait, BIL flat feet, mid-foot strike BIL       TODAY'S TREATMENT:  [facility] Adult PT Treatment:                                                DATE: 10/16/2021 Therapeutic Exercise: *** Manual Therapy: *** Neuromuscular re-ed: *** Therapeutic Activity: *** Modalities: *** Self Care: ***   [facility] Adult PT Treatment:                                                DATE: 10/04/2021 Therapeutic Exercise: Nusteplevel 6 x 5 mins Supine Peroneal Nerve Glide 2 x 10 on Rt Seated Ankle Inversion/Eversion with RTB Resistance x 10 each on Rt Long sitting PF/DF RTB x 10 ea Rt Towel scrunch Rt 1 x 60", 1 x 30" (painful after 60") Seated Calf Stretch with RTB 2 x 30sec on Rt Supine hamstring/calf stretch with RTB Rt 2 x 30 sec  Standing calf stretch 2 x 30"  Standing blue rocker board PF/DF/Inv/Ev x 10 ea Standing calf raise 2 x 10 Hip flexion 12.5# x 10 BIL Therapeutic Activity: Gait 2 x 185 ft encouraging heel/toe    [facility] Adult PT Treatment:                                                DATE: 10/02/2021 Therapeutic Exercise: Supine Peroneal Nerve Glide 2 x 10 on Rt Seated Ankle Inversion/Eversion with RTB Resistance x 10 each on Rt Ankle Inversion Eversion Towel Slide 2 x 10 on Rt (tactile cues for form) Towel scrunch Rt x 20 Seated Heel Raise with handhold resistance 2 x 10  Seated  march 2 x 10 BIL Seated Calf Stretch with Strap 2 x 30sec on Rt BAPS level 3 ankle PF/DF 2 x 10 BAPS circles CW/CCW x10 ea  Supine hamstring/calf stretch with strap Rt 2 x 30 sec  Standing calf raise at FM 2 x 10 Therapeutic Activity: Gait x 185 ft encouraging WBAT        PATIENT EDUCATION:  Education details: Assessed HEP, Educated on POC, prognosis, and HEP Person educated: Patient Education method: Consulting civil engineer, Demonstration, and Handouts Education comprehension: verbalized understanding and returned demonstration     HOME EXERCISE PROGRAM: Access Code: [email or web address] URL: [email or web address] Date: 09/26/2021 Prepared by: Vanessa [place]   Exercises Supine Peroneal Nerve Glide - 1 x daily - 7 x weekly - 3 sets - 15 reps Seated Ankle Eversion with Resistance - 1 x daily - 7 x weekly - 3 sets - 10 reps - 3-sec hold Ankle Inversion Eversion Towel Slide - 1 x daily - 7 x weekly - 2 sets - 10 reps Seated Heel Raise with HANDHOLD RESISTANCE - 1 x daily - 7 x weekly - 3 sets - 10 reps - 3-sec hold Seated Calf Stretch with Strap - 1 x daily - 7 x weekly - 2 sets - 1-min hold     ASSESSMENT:   CLINICAL IMPRESSION: ***    REHAB POTENTIAL: Good   CLINICAL DECISION MAKING: Stable/uncomplicated   EVALUATION COMPLEXITY: Low     GOALS: Goals reviewed with patient? Yes   SHORT TERM GOALS:   STG Name Target Date Goal status  1 Pt will report understanding and adherence to her HEP in order to promote independence in the management of her primary impairments. Baseline: HEP provided at eval 10/24/2021 MET    LONG TERM GOALS:    LTG Name Target Date Goal status  1 Pt will achieve a FOTO score of 61% in order to demonstrate improved functional ability as it relates to her lateral foot pain. Baseline: 51% 11/21/2021 INITIAL  2 Pt will achieve BIL ankle dorsiflexion AROM of 10 degrees in order to promote heel strike during functional gait. Baseline: 0d on Rt, 5d on Lt  11/21/2021 INITIAL  3 Pt will report ability to walk 30 minutes without a boot and with 0-2/10 pain in order to grocery shop with less limitation. Baseline: >5/10 pain after 5 minutes of walking 11/21/2021 INITIAL  4 Pt will report ability to sleep through the night without being woken due to pain in order to be well-rested for ADLs. Baseline: Pt woken several times each night due to pain. 11/21/2021 INITIAL  5 Pt will achieve global Rt ankle strength of 5/5 without pain in order to walk on uneven surfaces with good motor control. Baseline: See MMT chart 11/21/2021 INITIAL    PLAN: PT FREQUENCY: 2x/week   PT DURATION: 8 weeks   PLANNED INTERVENTIONS: Therapeutic exercises, Therapeutic activity, Neuro Muscular re-education, Balance training, Gait training, Patient/Family education, Joint mobilization, Stair training, Dry Needling, Electrical stimulation, Cryotherapy, Taping, Vasopneumatic device, and Manual therapy   PLAN FOR NEXT SESSION: Progress peroneal/ posterior tibialis, calf strengthening as able, desensitization techniques, avoid heat due to hx of cancer, measure ankle ROM next session    Vanessa Goodyear, PT, DPT 10/13/21 8:12 AM

## 2021-10-16 ENCOUNTER — Other Ambulatory Visit: Payer: Self-pay

## 2021-10-16 ENCOUNTER — Encounter: Payer: Self-pay | Admitting: Podiatry

## 2021-10-16 ENCOUNTER — Ambulatory Visit (INDEPENDENT_AMBULATORY_CARE_PROVIDER_SITE_OTHER): Payer: Medicare HMO | Admitting: Podiatry

## 2021-10-16 ENCOUNTER — Ambulatory Visit: Payer: Medicare HMO

## 2021-10-16 DIAGNOSIS — M7671 Peroneal tendinitis, right leg: Secondary | ICD-10-CM

## 2021-10-16 DIAGNOSIS — M722 Plantar fascial fibromatosis: Secondary | ICD-10-CM

## 2021-10-16 NOTE — Progress Notes (Signed)
°  Subjective:  Patient ID: Emily [name], female    DOB: 09-18-1940,  MRN: [id number]  Chief Complaint  Patient presents with   Routine Post Op      POV #2 DOS 09/07/2021 TOPAZ PROCEDURE RT PERONEAL TENDON & PLANTAR FASCIA W/PRP INJECTION    81 y.o. female returns for post-op check.  Overall doing okay.  She has had quite a bit of improvement in pain since I last spoke with her by patient message, she is no longer doing physical therapy thinks that the balance and stability exercises they were working on were probably too soon and too severe for that time point.  Review of Systems: Negative except as noted in the HPI. Denies N/V/F/Ch.   Objective:  There were no vitals filed for this visit. There is no height or weight on file to calculate BMI. Constitutional Well developed. Well nourished.  Vascular Foot warm and well perfused. Capillary refill normal to all digits.  Calf is soft and supple, no posterior calf or knee pain, negative Homans' sign  Neurologic Normal speech. Oriented to person, place, and time. Epicritic sensation to light touch grossly present bilaterally.  Dermatologic Skin healing well without signs of infection.   Orthopedic: Tenderness now only at the insertion of the peroneus brevis on the dorsal fifth metatarsal base, no pain along the lateral band of the plantar fascia.    Assessment:   1. Peroneal tendinitis of right lower extremity   2. Plantar fasciitis of right foot    Plan:  Patient was evaluated and treated and all questions answered.  S/p foot surgery right She has had some improvement I agree with her that may be som  Has improved now.  I recommended she restart her home physical therapy twice daily as tolerated.  Hopefully this will continue to improve I expect maximal benefit by 12 weeks.  We also discussed the option of open surgery to repair the tendon if indicated if she does not improve.  Would like to avoid this if at all possible her  husband is about left shoulder surgery.  I will see her back in 6 weeks  Return in about 6 weeks (around 11/27/2021) for re-check peroneal tendinitis, post op (no x-rays).

## 2021-10-18 ENCOUNTER — Ambulatory Visit: Payer: Medicare HMO

## 2021-10-18 ENCOUNTER — Other Ambulatory Visit: Payer: Self-pay | Admitting: Internal Medicine

## 2021-10-19 ENCOUNTER — Other Ambulatory Visit: Payer: Self-pay | Admitting: Internal Medicine

## 2021-10-19 DIAGNOSIS — M7671 Peroneal tendinitis, right leg: Secondary | ICD-10-CM | POA: Diagnosis not present

## 2021-10-22 DIAGNOSIS — Z1211 Encounter for screening for malignant neoplasm of colon: Secondary | ICD-10-CM | POA: Diagnosis not present

## 2021-10-25 ENCOUNTER — Ambulatory Visit: Payer: Medicare HMO

## 2021-10-26 ENCOUNTER — Other Ambulatory Visit: Payer: Self-pay | Admitting: Internal Medicine

## 2021-10-26 DIAGNOSIS — Z1231 Encounter for screening mammogram for malignant neoplasm of breast: Secondary | ICD-10-CM

## 2021-10-29 LAB — COLOGUARD: COLOGUARD: NEGATIVE

## 2021-11-02 DIAGNOSIS — M25571 Pain in right ankle and joints of right foot: Secondary | ICD-10-CM | POA: Diagnosis not present

## 2021-11-08 ENCOUNTER — Other Ambulatory Visit (INDEPENDENT_AMBULATORY_CARE_PROVIDER_SITE_OTHER): Payer: Medicare HMO

## 2021-11-08 ENCOUNTER — Other Ambulatory Visit: Payer: Self-pay

## 2021-11-08 DIAGNOSIS — E038 Other specified hypothyroidism: Secondary | ICD-10-CM

## 2021-11-08 LAB — TSH: TSH: 0.07 u[IU]/mL — ABNORMAL LOW (ref 0.35–5.50)

## 2021-11-08 MED ORDER — LEVOTHYROXINE SODIUM 75 MCG PO TABS: 75.0000 ug | ORAL_TABLET | Freq: Every day | ORAL | 3 refills | Status: DC

## 2021-11-16 ENCOUNTER — Telehealth: Payer: Self-pay

## 2021-11-16 ENCOUNTER — Other Ambulatory Visit: Payer: Self-pay | Admitting: Physician Assistant

## 2021-11-16 DIAGNOSIS — J40 Bronchitis, not specified as acute or chronic: Secondary | ICD-10-CM

## 2021-11-16 MED ORDER — ALBUTEROL SULFATE HFA 108 (90 BASE) MCG/ACT IN AERS: 2.0000 | INHALATION_SPRAY | Freq: Four times a day (QID) | RESPIRATORY_TRACT | 0 refills | Status: DC | PRN

## 2021-11-16 NOTE — Telephone Encounter (Signed)
Pt is requesting a refill on: ?albuterol (VENTOLIN HFA) 108 (90 Base) MCG/ACT inhaler ? ?Pharmacy: ?[id number]- [place], [place] - [address]? ?LOV 09/27/21 ? ?This Rx was originally prescribed by [facility]    ?

## 2021-11-16 NOTE — Telephone Encounter (Signed)
Inhaler sent in today. ?

## 2021-11-19 DIAGNOSIS — M25571 Pain in right ankle and joints of right foot: Secondary | ICD-10-CM | POA: Diagnosis not present

## 2021-11-27 ENCOUNTER — Encounter: Payer: Medicare HMO | Admitting: Podiatry

## 2021-11-27 ENCOUNTER — Encounter: Payer: Self-pay | Admitting: Internal Medicine

## 2021-11-27 NOTE — Progress Notes (Signed)
? ? ?Subjective:  ? ? Patient ID: Emily [name], female    DOB: [birth year], 81 y.o.   MRN: [id number] ? ?This visit occurred during the SARS-CoV-2 public health emergency.  Safety protocols were in place, including screening questions prior to the visit, additional usage of staff PPE, and extensive cleaning of exam room while observing appropriate contact time as indicated for disinfecting solutions. ? ? ? ?HPI ?Emily [name] is here for  ?Chief Complaint  ?Patient presents with  ? Trouble breathing  ? ? ? ?After thyroid med dose was changed - one week later had some difficulty breathing.  She notices it after she gets up in the morning and it gets better as the day progresses.  It may be worse with rushing around.  She also has a cough - feels like mucus in her throat that wants to come up but will not come up.  She has the occaisional dry cough, voice crackles and has PND>  ? ?Taking zyrtec and just switched to chlor-tabs and nasal srpay ? ? ?Albuterol made her cough more and felt worse ? ? ? ?Medications and allergies reviewed with patient and updated if appropriate. ? ?Current Outpatient Medications on File Prior to Visit  ?Medication Sig Dispense Refill  ? albuterol (VENTOLIN HFA) 108 (90 Base) MCG/ACT inhaler Inhale 2 puffs into the lungs every 6 (six) hours as needed for wheezing or shortness of breath. 8 g 0  ? atenolol (TENORMIN) 25 MG tablet TAKE 1 TABLET BY MOUTH EVERY DAY 90 tablet 1  ? azelastine (ASTELIN) 0.1 % nasal spray Place 2 sprays into both nostrils daily as needed for rhinitis. Use in each nostril as directed 30 mL 12  ? Calcium-Vitamin D-Vitamin K 619-509-32 MG-UNT-MCG TABS Take 1 tablet by mouth 2 (two) times daily.    ? chlorpheniramine (CHLOR-TRIMETON) 4 MG tablet Take 4 mg by mouth 2 (two) times daily as needed for allergies.    ? cholecalciferol (VITAMIN D) 25 MCG (1000 UNIT) tablet Take 1,000 Units by mouth daily.    ? [employer]S ALLERGY RELIEF-D 5-120 MG tablet Take 1 tablet by mouth 2 (two) times  daily.    ? EPINEPHrine 0.3 mg/0.3 mL IJ SOAJ injection Inject 0.3 mg into the muscle as needed for anaphylaxis. 1 each 0  ? ipratropium (ATROVENT) 0.03 % nasal spray INSTILL 2 SPRAYS IN EACH NOSTRIL EVERY 12 HOURS 30 mL 5  ? levothyroxine (SYNTHROID) 75 MCG tablet Take 1 tablet (75 mcg total) by mouth daily. 90 tablet 3  ? methocarbamol (ROBAXIN) 500 MG tablet Take 1 tablet (500 mg total) by mouth at bedtime as needed for muscle spasms. 30 tablet 1  ? triamcinolone (KENALOG) 0.025 % ointment Apply 1 application topically 2 (two) times daily. Use as needed. 30 g 1  ? ?No current facility-administered medications on file prior to visit.  ? ? ?Review of Systems  ?Constitutional:  Negative for chills and fever.  ?HENT:  Positive for postnasal drip and rhinorrhea. Negative for congestion and sore throat.   ?Respiratory:  Positive for cough and shortness of breath. Negative for chest tightness and wheezing.   ?Gastrointestinal:   ?     No gerd  ?Neurological:  Negative for dizziness and headaches.  ? ?   ?Objective:  ? ?Vitals:  ? 11/28/21 1337  ?BP: 114/78  ?Pulse: 79  ?Temp: 97.7 ?F (36.5 ?C)  ?SpO2: 96%  ? ?BP Readings from Last 3 Encounters:  ?11/28/21 114/78  ?09/27/21 118/76  ?06/27/21 114/74  ? ?  Wt Readings from Last 3 Encounters:  ?11/28/21 122 lb 6.4 oz (55.5 kg)  ?06/27/21 122 lb 8 oz (55.6 kg)  ?05/23/21 120 lb (54.4 kg)  ? ?Body mass index is 23.9 kg/m?. ? ?  ?Physical Exam ?Constitutional:   ?   General: She is not in acute distress. ?   Appearance: Normal appearance. She is not ill-appearing.  ?HENT:  ?   Head: Normocephalic and atraumatic.  ?   Right Ear: Tympanic membrane, ear canal and external ear normal.  ?   Left Ear: Tympanic membrane, ear canal and external ear normal.  ?   Mouth/Throat:  ?   Mouth: Mucous membranes are moist.  ?   Pharynx: No oropharyngeal exudate or posterior oropharyngeal erythema.  ?Eyes:  ?   Conjunctiva/sclera: Conjunctivae normal.  ?Cardiovascular:  ?   Rate and Rhythm:  Normal rate and regular rhythm.  ?Pulmonary:  ?   Effort: Pulmonary effort is normal. No respiratory distress.  ?   Breath sounds: Normal breath sounds. No wheezing or rales.  ?Musculoskeletal:  ?   Cervical back: Neck supple. No tenderness.  ?Lymphadenopathy:  ?   Cervical: No cervical adenopathy.  ?Skin: ?   General: Skin is warm and dry.  ?Neurological:  ?   Mental Status: She is alert.  ? ?   ? ? ? ? ? ?Assessment & Plan:  ? ? ?See Problem List for Assessment and Plan of chronic medical problems.  ? ? ? ? ?

## 2021-11-28 ENCOUNTER — Ambulatory Visit (INDEPENDENT_AMBULATORY_CARE_PROVIDER_SITE_OTHER): Payer: Medicare HMO | Admitting: Internal Medicine

## 2021-11-28 VITALS — BP 114/78 | HR 79 | Temp 97.7°F | Ht 60.0 in | Wt 122.4 lb

## 2021-11-28 DIAGNOSIS — E038 Other specified hypothyroidism: Secondary | ICD-10-CM | POA: Diagnosis not present

## 2021-11-28 DIAGNOSIS — R0602 Shortness of breath: Secondary | ICD-10-CM

## 2021-11-28 DIAGNOSIS — R051 Acute cough: Secondary | ICD-10-CM

## 2021-11-28 MED ORDER — LEVOTHYROXINE SODIUM 88 MCG PO TABS: ORAL_TABLET | ORAL | 3 refills | Status: DC

## 2021-11-28 MED ORDER — LEVOTHYROXINE SODIUM 75 MCG PO TABS: ORAL_TABLET | ORAL | 3 refills | Status: DC

## 2021-11-28 MED ORDER — METHYLPREDNISOLONE 4 MG PO TBPK: ORAL_TABLET | ORAL | 0 refills | Status: DC

## 2021-11-28 NOTE — Assessment & Plan Note (Signed)
Chronic ?Dose recently decreased ?Agree she likely needs a dose somewhere between 75 mcg daily and 88 mcg daily ?Start 75 mcg daily 5 days a week and 88 mcg daily 2 days a week ?Check TSH in 6 weeks ?

## 2021-11-28 NOTE — Patient Instructions (Addendum)
? ? ? ?  Medications changes include :   75 mcg 5 days a week, 88 mcg twice daily ? ? ?Continue your allergy medications.   ? ? ?Your prescription(s) have been sent to your pharmacy.  ? ? ? ? ?Return for lab schedule in about 6 week. ? ?

## 2021-11-28 NOTE — Assessment & Plan Note (Signed)
Acute ?Experiencing shortness of breath times few weeks-she thinks it started after we decreased her thyroid medication dose.  She feels it is worse for some morning and it gets better as the day progresses ?She also has some feels like there is mucus in her throat that wants to come up, but she is not able to get it up.  She does have some voice crackling and some postnasal drainage ?Her daughter feels it could be related to her thyroid because she has had something similar ?She also has a history of allergic rhinitis and possible GERD causing similar symptoms in the past ?She deferred chest x-ray today-we can do this if her symptoms do not improve ?I think her symptoms most likely are related to mild reactive airway disease seasonal allergies ?Prescribed Medrol Dosepak ?She will continue her oral antihistamine and nasal spray and update me on how her symptoms are after the Medrol Dosepak ?Denies GERD, but that may be the other possible cause-can consider trial of omeprazole if needed ?

## 2021-11-30 DIAGNOSIS — M7671 Peroneal tendinitis, right leg: Secondary | ICD-10-CM | POA: Diagnosis not present

## 2021-12-02 ENCOUNTER — Encounter: Payer: Self-pay | Admitting: Internal Medicine

## 2021-12-03 ENCOUNTER — Telehealth: Payer: Self-pay | Admitting: Cardiology

## 2021-12-03 ENCOUNTER — Telehealth: Payer: Self-pay | Admitting: Internal Medicine

## 2021-12-03 NOTE — Telephone Encounter (Signed)
RN returned call to patient.  ? ?Patient endorses shortness of breath x3 weeks- went to PCP and got prednisone which is not helping. Patient is [facility] when sitting and moving around.  ? ?Patient states weight is fluctuating 1-2 pounds but nothing more than that.  ? ? ?Patient not shob on phone or having chest pain.  ? ? ?Will route to Dr. Harrell Gave for [place], patient scheduled 4/20 at 1140am ?

## 2021-12-03 NOTE — Telephone Encounter (Signed)
Called patient to schedule AWV. Patient is still not feeling well and has sent Dr. Quay Burow a message. I advised patient we would hold off on AWV until she is feeling better. Will call patient back in a few months to schedule AWV. ?

## 2021-12-03 NOTE — Telephone Encounter (Signed)
Pt c/o Shortness Of Breath: STAT if SOB developed within the last 24 hours or pt is noticeably SOB on the phone ? ?1. Are you currently SOB (can you hear that pt is SOB on the phone)? Yes  ? ?2. How long have you been experiencing SOB? For past 3 weeks ? ?3. Are you SOB when sitting or when up moving around? Both  ? ?4. Are you currently experiencing any other symptoms? lightheadedness  ?

## 2021-12-04 ENCOUNTER — Other Ambulatory Visit: Payer: Self-pay

## 2021-12-04 ENCOUNTER — Emergency Department (HOSPITAL_BASED_OUTPATIENT_CLINIC_OR_DEPARTMENT_OTHER)
Admission: EM | Admit: 2021-12-04 | Discharge: 2021-12-04 | Disposition: A | Discharge status: Home / Self Care | Payer: Medicare HMO | Attending: Emergency Medicine | Admitting: Emergency Medicine

## 2021-12-04 ENCOUNTER — Encounter (HOSPITAL_BASED_OUTPATIENT_CLINIC_OR_DEPARTMENT_OTHER): Payer: Self-pay | Admitting: Obstetrics and Gynecology

## 2021-12-04 ENCOUNTER — Emergency Department (HOSPITAL_BASED_OUTPATIENT_CLINIC_OR_DEPARTMENT_OTHER): Payer: Medicare HMO | Admitting: Radiology

## 2021-12-04 ENCOUNTER — Emergency Department (HOSPITAL_BASED_OUTPATIENT_CLINIC_OR_DEPARTMENT_OTHER): Payer: Medicare HMO

## 2021-12-04 DIAGNOSIS — R0609 Other forms of dyspnea: Secondary | ICD-10-CM | POA: Diagnosis not present

## 2021-12-04 DIAGNOSIS — N189 Chronic kidney disease, unspecified: Secondary | ICD-10-CM | POA: Insufficient documentation

## 2021-12-04 DIAGNOSIS — J45909 Unspecified asthma, uncomplicated: Secondary | ICD-10-CM | POA: Insufficient documentation

## 2021-12-04 DIAGNOSIS — Z853 Personal history of malignant neoplasm of breast: Secondary | ICD-10-CM | POA: Insufficient documentation

## 2021-12-04 DIAGNOSIS — E039 Hypothyroidism, unspecified: Secondary | ICD-10-CM | POA: Insufficient documentation

## 2021-12-04 DIAGNOSIS — R5383 Other fatigue: Secondary | ICD-10-CM | POA: Diagnosis not present

## 2021-12-04 DIAGNOSIS — R918 Other nonspecific abnormal finding of lung field: Secondary | ICD-10-CM | POA: Diagnosis not present

## 2021-12-04 DIAGNOSIS — R0602 Shortness of breath: Secondary | ICD-10-CM | POA: Diagnosis not present

## 2021-12-04 DIAGNOSIS — I129 Hypertensive chronic kidney disease with stage 1 through stage 4 chronic kidney disease, or unspecified chronic kidney disease: Secondary | ICD-10-CM | POA: Insufficient documentation

## 2021-12-04 DIAGNOSIS — Z79899 Other long term (current) drug therapy: Secondary | ICD-10-CM | POA: Diagnosis not present

## 2021-12-04 LAB — COMPREHENSIVE METABOLIC PANEL
ALT: 15 U/L (ref 0–44)
AST: 16 U/L (ref 15–41)
Albumin: 4.2 g/dL (ref 3.5–5.0)
Alkaline Phosphatase: 72 U/L (ref 38–126)
Anion gap: 7 (ref 5–15)
BUN: 33 mg/dL — ABNORMAL HIGH (ref 8–23)
CO2: 29 mmol/L (ref 22–32)
Calcium: 10.7 mg/dL — ABNORMAL HIGH (ref 8.9–10.3)
Chloride: 104 mmol/L (ref 98–111)
Creatinine, Ser: 1.07 mg/dL — ABNORMAL HIGH (ref 0.44–1.00)
GFR, Estimated: 53 mL/min — ABNORMAL LOW (ref >60)
Glucose, Bld: 82 mg/dL (ref 70–99)
Potassium: 4 mmol/L (ref 3.5–5.1)
Sodium: 140 mmol/L (ref 135–145)
Total Bilirubin: 0.4 mg/dL (ref 0.3–1.2)
Total Protein: 7.1 g/dL (ref 6.5–8.1)

## 2021-12-04 LAB — CBC WITH DIFFERENTIAL/PLATELET
Abs Immature Granulocytes: 0.07 10*3/uL (ref 0.00–0.07)
Basophils Absolute: 0.1 10*3/uL (ref 0.0–0.1)
Basophils Relative: 1 %
Eosinophils Absolute: 0.1 10*3/uL (ref 0.0–0.5)
Eosinophils Relative: 1 %
HCT: 44.8 % (ref 36.0–46.0)
Hemoglobin: 14.6 g/dL (ref 12.0–15.0)
Immature Granulocytes: 1 %
Lymphocytes Relative: 19 %
Lymphs Abs: 2.3 10*3/uL (ref 0.7–4.0)
MCH: 29 pg (ref 26.0–34.0)
MCHC: 32.6 g/dL (ref 30.0–36.0)
MCV: 88.9 fL (ref 80.0–100.0)
Monocytes Absolute: 1 10*3/uL (ref 0.1–1.0)
Monocytes Relative: 8 %
Neutro Abs: 8.7 10*3/uL — ABNORMAL HIGH (ref 1.7–7.7)
Neutrophils Relative %: 70 %
Platelets: 242 10*3/uL (ref 150–400)
RBC: 5.04 MIL/uL (ref 3.87–5.11)
RDW: 12.9 % (ref 11.5–15.5)
WBC: 12.2 10*3/uL — ABNORMAL HIGH (ref 4.0–10.5)
nRBC: 0 % (ref 0.0–0.2)

## 2021-12-04 LAB — MAGNESIUM: Magnesium: 2.3 mg/dL (ref 1.7–2.4)

## 2021-12-04 LAB — BRAIN NATRIURETIC PEPTIDE: B Natriuretic Peptide: 66.3 pg/mL (ref 0.0–100.0)

## 2021-12-04 LAB — TROPONIN I (HIGH SENSITIVITY): Troponin I (High Sensitivity): 3 ng/L (ref <18)

## 2021-12-04 MED ORDER — ALBUTEROL SULFATE (2.5 MG/3ML) 0.083% IN NEBU: 2.5000 mg | INHALATION_SOLUTION | Freq: Four times a day (QID) | RESPIRATORY_TRACT | Status: DC | PRN

## 2021-12-04 MED ORDER — IOHEXOL 350 MG/ML SOLN
100.0000 mL | Freq: Once | INTRAVENOUS | Status: AC | PRN
  Administered 2021-12-04: 60 mL via INTRAVENOUS

## 2021-12-04 NOTE — ED Provider Notes (Signed)
?[facility] ?Provider Note ? ? ?CSN: [id number] ?Arrival date & time: 12/04/21  5993 ? ?  ? ?History ? ?Chief Complaint  ?Patient presents with  ? Shortness of Breath  ? ? ?Emily [name] is a 81 y.o. female. ? ? ?Shortness of Breath ?Patient presents for shortness of breath.  She states that this has been ongoing for the past 3 weeks.  Her medical history includes remote breast cancer, CKD, pleural plaque with asbestosis, HLD, reactive airway disease, HTN, hypothyroidism, and GERD.  She was put in a steroid taper 6 days ago.  This has not improved her symptoms.  Patient currently denies any shortness of breath but has noticed intermittent episodes.  With her shortness of breath episodes, she has also felt lightheaded.  She has checked her blood pressures at these times and found it to be elevated.  She had a difficult time walking upstairs.  She has also had orthopnea and sleeps propped up on pillows.  She feels that she has had chest congestion and cough, however, she has not been able to cough up any sputum.  She denies any areas of swelling.  She denies any areas of pain.  She does have a cardiology appointment scheduled for 2 days from now. ?  ? ?Home Medications ?Prior to Admission medications   ?Medication Sig Start Date End Date Taking? Authorizing Provider  ?albuterol (VENTOLIN HFA) 108 (90 Base) MCG/ACT inhaler Inhale 2 puffs into the lungs every 6 (six) hours as needed for wheezing or shortness of breath. 11/16/21   Binnie Rail, MD  ?atenolol (TENORMIN) 25 MG tablet TAKE 1 TABLET BY MOUTH EVERY DAY 08/27/21   Binnie Rail, MD  ?Calcium-Vitamin D-Vitamin K [id number] MG-UNT-MCG TABS Take 1 tablet by mouth 2 (two) times daily.    [provider]  ?chlorpheniramine (CHLOR-TRIMETON) 4 MG tablet Take 4 mg by mouth 2 (two) times daily as needed for allergies.    [provider]  ?cholecalciferol (VITAMIN D) 25 MCG (1000 UNIT) tablet Take 1,000 Units by mouth  daily.    [provider]  ?[employer]S ALLERGY RELIEF-D 5-120 MG tablet Take 1 tablet by mouth 2 (two) times daily. 08/26/21   [provider]  ?EPINEPHrine 0.3 mg/0.3 mL IJ SOAJ injection Inject 0.3 mg into the muscle as needed for anaphylaxis. 05/21/21   Horton, Barbette Hair, MD  ?ipratropium (ATROVENT) 0.03 % nasal spray INSTILL 2 SPRAYS IN EACH NOSTRIL EVERY 12 HOURS 06/28/21   Binnie Rail, MD  ?levothyroxine (SYNTHROID) 75 MCG tablet Take 1 tablet daily 30 minutes before breakfast 5 days a week 11/28/21   Binnie Rail, MD  ?levothyroxine (SYNTHROID) 88 MCG tablet Take 1 tablet daily 30 minutes prior to breakfast twice weekly 11/28/21   Binnie Rail, MD  ?methocarbamol (ROBAXIN) 500 MG tablet Take 1 tablet (500 mg total) by mouth at bedtime as needed for muscle spasms. 04/10/21   Binnie Rail, MD  ?methylPREDNISolone (MEDROL DOSEPAK) 4 MG TBPK tablet 24 mg PO on day 1, then decr. by 4 mg/day x5 days 11/28/21   Binnie Rail, MD  ?triamcinolone (KENALOG) 0.025 % ointment Apply 1 application topically 2 (two) times daily. Use as needed. 03/29/21   Nunzio Cobbs, MD  ?   ? ?Allergies    ?Molnupiravir, Allegra [fexofenadine], Gabapentin, Levaquin [levofloxacin], Nexium [esomeprazole magnesium], and Valerian root   ? ?Review of Systems   ?Review of Systems  ?Constitutional:  Positive for fatigue.  ?  Respiratory:  Positive for shortness of breath.   ?Neurological:  Positive for light-headedness.  ? ?Physical Exam ?Updated Vital Signs ?BP (!) 158/87 (BP Location: Right Arm)   Pulse 69   Temp 97.8 ?F (36.6 ?C)   Resp 12   SpO2 97%  ?Physical Exam ?Vitals and nursing note reviewed.  ?Constitutional:   ?   General: She is not in acute distress. ?   Appearance: She is well-developed and normal weight. She is not ill-appearing, toxic-appearing or diaphoretic.  ?HENT:  ?   Head: Normocephalic and atraumatic.  ?   Mouth/Throat:  ?   Mouth: Mucous membranes are moist.  ?   Pharynx: Oropharynx is  clear.  ?Eyes:  ?   Extraocular Movements: Extraocular movements intact.  ?   Conjunctiva/sclera: Conjunctivae normal.  ?Cardiovascular:  ?   Rate and Rhythm: Normal rate and regular rhythm.  ?   Heart sounds: No murmur heard. ?Pulmonary:  ?   Effort: Pulmonary effort is normal. No tachypnea, accessory muscle usage or respiratory distress.  ?   Breath sounds: Normal breath sounds. No decreased breath sounds, wheezing, rhonchi or rales.  ?Chest:  ?   Chest wall: No tenderness or edema.  ?Abdominal:  ?   Palpations: Abdomen is soft.  ?   Tenderness: There is no abdominal tenderness.  ?Musculoskeletal:     ?   General: No swelling.  ?   Cervical back: Normal range of motion and neck supple.  ?   Right lower leg: No edema.  ?   Left lower leg: No edema.  ?Skin: ?   General: Skin is warm and dry.  ?   Capillary Refill: Capillary refill takes less than 2 seconds.  ?   Coloration: Skin is not cyanotic or pale.  ?Neurological:  ?   General: No focal deficit present.  ?   Mental Status: She is alert and oriented to person, place, and time.  ?   Cranial Nerves: No cranial nerve deficit.  ?   Motor: No weakness.  ?Psychiatric:     ?   Mood and Affect: Mood normal.     ?   Behavior: Behavior normal.  ? ? ?ED Results / Procedures / Treatments   ?Labs ?(all labs ordered are listed, but only abnormal results are displayed) ?Labs Reviewed  ?CBC WITH DIFFERENTIAL/PLATELET - Abnormal; Notable for the following components:  ?    Result Value  ? WBC 12.2 (*)   ? Neutro Abs 8.7 (*)   ? All other components within normal limits  ?COMPREHENSIVE METABOLIC PANEL - Abnormal; Notable for the following components:  ? BUN 33 (*)   ? Creatinine, Ser 1.07 (*)   ? Calcium 10.7 (*)   ? GFR, Estimated 53 (*)   ? All other components within normal limits  ?BRAIN NATRIURETIC PEPTIDE  ?MAGNESIUM  ?TROPONIN I (HIGH SENSITIVITY)  ? ? ?EKG ?EKG Interpretation ? ?Date/Time:  Tuesday December 04 2021 08:47:12 EDT ?Ventricular Rate:  67 ?PR Interval:  196 ?QRS  Duration: 72 ?QT Interval:  402 ?QTC Calculation: 424 ?R Axis:   123 ?Text Interpretation: Normal sinus rhythm Biatrial enlargement Right axis deviation Low voltage QRS Abnormal ECG No previous ECGs available Confirmed by Godfrey Pick [phone]) on 12/04/2021 9:11:06 AM ? ?Radiology ?DG Chest 2 View ? ?Result Date: 12/04/2021 ?CLINICAL DATA:  Provided history: Shortness of breath. Additional history provided: Shortness of breath, fatigue, chest congestion. EXAM: CHEST - 2 VIEW COMPARISON:  Prior chest radiographs 05/23/2021 and earlier.  FINDINGS: Heart size within normal limits. Redemonstrated calcified pleural plaques. No appreciable airspace consolidation. No evidence of pleural effusion or pneumothorax. No acute bony abnormality identified. Redemonstrated pectus excavatum deformity. Surgical clips within the left axilla and upper abdomen. IMPRESSION: No evidence of acute cardiopulmonary abnormality. Redemonstrated calcified pleural plaques consistent with asbestos-related pleural disease. Redemonstrated pectus excavatum deformity. Electronically Signed   By: Kellie Simmering D.O.   On: 12/04/2021 09:05  ? ?CT Angio Chest PE W and/or Wo Contrast ? ?Result Date: 12/04/2021 ?CLINICAL DATA:  A 81 year old female presents with suspected pulmonary embolism. EXAM: CT ANGIOGRAPHY CHEST WITH CONTRAST TECHNIQUE: Multidetector CT imaging of the chest was performed using the standard protocol during bolus administration of intravenous contrast. Multiplanar CT image reconstructions and MIPs were obtained to evaluate the vascular anatomy. RADIATION DOSE REDUCTION: This exam was performed according to the departmental dose-optimization program which includes automated exposure control, adjustment of the mA and/or kV according to patient size and/or use of iterative reconstruction technique. CONTRAST:  64m OMNIPAQUE IOHEXOL 350 MG/ML SOLN COMPARISON:  June 18, 2019. FINDINGS: Cardiovascular: The aorta is not yet opacified due to bolus  timing optimized for pulmonary arterial evaluation., mild thoracic aorta. The heart is compressed by moderate to marked pectus excavatum as on previous imaging and without signs of pericardial effusion. Normal si

## 2021-12-04 NOTE — ED Notes (Signed)
Returned from Ct scan  

## 2021-12-04 NOTE — ED Triage Notes (Signed)
Patient reports to the ER for a breathing problem x3 weeks. Patient has been unable to do activities she normally would without feeling tired and weak. Reports she struggled with walking up the stairs today. Patient reports she feels like she has congestion she cannot get up.  ?

## 2021-12-04 NOTE — Discharge Instructions (Addendum)
Follow-up with your cardiologist this week.  Return to the emergency department if you experience any worsening of symptoms. ?

## 2021-12-05 NOTE — Progress Notes (Signed)
?Cardiology Office Note:   ? ?Date:  12/06/2021  ? ?ID:  Emily [name], DOB 1941/01/04, MRN [id number] ? ?PCP:  Binnie Rail, MD  ?Cardiologist:  Buford Dresser, MD PhD ? ?Referring MD: Binnie Rail, MD  ? ?CC: follow up ? ?History of Present Illness:   ? ?Emily [name] is a 81 y.o. female with a hx of palpitations, mitral valve prolapse who is seen for follow up. I initially met her 05/20/2018 as a new consult at the request of Burns, [name], MD for the evaluation and management of presyncope and palpitations.  ? ?Today: ?She presents as an urgent add-on visit after calling into the triage line and reporting 3 week history of shortness of breath. ? ?Her daughter had thyroid cancer and underwent total thyroidectomy. One day, patient had difficulty breathing and her daughter recommended she get her thyroid checked. Despite starting medications, she continues to feel short of breath. Last Tuesday, she walked up a flight of stairs and became severely short of breath that she was not able to take a good breath. She became lightheaded and developed a headache. She presented to the ER and, reportedly, had a blood pressure of 151/100. The shortness of breath can occur while she is at rest or after exertion.  ? ?She was given prednisone and completed it yesterday. She developed a non-productive cough but noticed some improvement with her shortness of breath. She had similar symptoms of dyspnea in 2020 and was given a shot which resolved her shortness of breath. She believes the injection was cortisone. She denies any chest pain, syncope, orthopnea, PND, or lower extremity edema. ? ?Her husband believes she may have anxiety and she suspects she has emotional high blood pressure. At home, when she becomes excited, she feels like her blood pressure elevates. ? ?Past Medical History:  ?Diagnosis Date  ? Actinic keratosis   ? Allergic rhinitis   ? BACK PAIN   ? CARCINOMA, BREAST, ESTROGEN RECEPTOR NEGATIVE  2001  ? L s/p lumpectomy, chemo and XRT  ? Cervicalgia   ? Cholelithiasis   ? DIVERTICULOSIS, COLON   ? ENDOMETRIOSIS   ? GERD (gastroesophageal reflux disease)   ? Heart palpitations   ? Hypertension   ? HYPOTHYROIDISM   ? Left wrist fracture 11/06/2016  ? 10/2016 - fell of high stool  ? Personal history of chemotherapy   ? Personal history of radiation therapy   ? Pleural plaque without asbestos   ? CT chest 10/2010: R>L lobes, upper> lower  ? Squamous cell carcinoma   ? History of BC  ? ? ?Past Surgical History:  ?Procedure Laterality Date  ? BREAST LUMPECTOMY  2001  ? left  ? CATARACT EXTRACTION Bilateral 09/2014  ? both eyes  ? CHOLECYSTECTOMY  2010  ? ENDOMETRIAL ABLATION    ? EXCISION / BIOPSY BREAST / NIPPLE / DUCT Left 2001  ? ? ?Current Medications: ?Current Outpatient Medications on File Prior to Visit  ?Medication Sig  ? albuterol (VENTOLIN HFA) 108 (90 Base) MCG/ACT inhaler Inhale 2 puffs into the lungs every 6 (six) hours as needed for wheezing or shortness of breath.  ? atenolol (TENORMIN) 25 MG tablet TAKE 1 TABLET BY MOUTH EVERY DAY  ? Calcium-Vitamin D-Vitamin K 774-142-39 MG-UNT-MCG TABS Take 1 tablet by mouth 2 (two) times daily.  ? chlorpheniramine (CHLOR-TRIMETON) 4 MG tablet Take 4 mg by mouth 2 (two) times daily as needed for allergies.  ? cholecalciferol (VITAMIN D) 25 MCG (1000 UNIT)  tablet Take 1,000 Units by mouth daily.  ? CVS ALLERGY RELIEF-D 5-120 MG tablet Take 1 tablet by mouth 2 (two) times daily.  ? EPINEPHrine 0.3 mg/0.3 mL IJ SOAJ injection Inject 0.3 mg into the muscle as needed for anaphylaxis.  ? ipratropium (ATROVENT) 0.03 % nasal spray INSTILL 2 SPRAYS IN EACH NOSTRIL EVERY 12 HOURS  ? levothyroxine (SYNTHROID) 75 MCG tablet Take 1 tablet daily 30 minutes before breakfast 5 days a week  ? levothyroxine (SYNTHROID) 88 MCG tablet Take 1 tablet daily 30 minutes prior to breakfast twice weekly  ? methocarbamol (ROBAXIN) 500 MG tablet Take 1 tablet (500 mg total) by mouth at bedtime  as needed for muscle spasms.  ? methylPREDNISolone (MEDROL DOSEPAK) 4 MG TBPK tablet 24 mg PO on day 1, then decr. by 4 mg/day x5 days  ? triamcinolone (KENALOG) 0.025 % ointment Apply 1 application topically 2 (two) times daily. Use as needed.  ? ?No current facility-administered medications on file prior to visit.  ?  ? ?Allergies:   Molnupiravir, Allegra [fexofenadine], Gabapentin, Levaquin [levofloxacin], Nexium [esomeprazole magnesium], and Valerian root  ? ?Family History: ?The patient's family history includes Dementia in her mother; Emphysema in her father; Heart disease in her father. There is no history of Colon cancer, Esophageal cancer, or Rectal cancer. ? ?ROS:   ?Please see the history of present illness.   ?(+) Shortness of breath ?(+) Non-productive cough  ?Additional pertinent ROS otherwise unremarkable. ? ?EKGs/Labs/Other Studies Reviewed:   ? ?The following studies were reviewed today: ?CTA Chest 12/04/21 ?IMPRESSION: ?1. Negative for pulmonary embolism. ?2. Multiple areas of pleural plaque with variable degrees of ?calcification compatible with asbestos related pleural disease ?showing no change. ?3. Pectus excavatum as before. ?4. Suspect increased size of small sliding type hiatal hernia. ?  ?Aortic Atherosclerosis (ICD10-I70.0). ? ?Monitor 06/16/2018 ?14 day Zio patch. No VT, high degree AV block or pauses. Patient had a min HR of 44 bpm, max HR of 207 bpm, and avg HR of 72 bpm. Predominant underlying rhythm was Sinus Rhythm. First Degree AV Block was present.  ?  ?72 Supraventricular Tachycardia runs occurred, the run with the fastest interval lasting 6 beats with a max rate of 207 bpm, the longest lasting 16 beats with an avg rate of 107 bpm. There were 23 triggered patient events, and supraventricular tachycardia was detected within +/- 45 seconds of symptomatic patient event(s). Isolated supraventricular and ventricular ectopy beats were rare (<1.0%).  ? ?Echo 05/21/2018 ?- Left ventricle:  The cavity size was normal. Systolic function was  ?  normal. The estimated ejection fraction was in the range of 60%  ?  to 65%. Wall motion was normal; there were no regional wall  ?  motion abnormalities.  ?- Aortic valve: Transvalvular velocity was within the normal range.  ?  There was no stenosis. There was mild regurgitation.  ?- Mitral valve: Transvalvular velocity was within the normal range.  ?  There was no evidence for stenosis. There was trivial  ?  regurgitation.  ?- Right ventricle: The cavity size was normal. Wall thickness was  ?  normal. Systolic function was normal.  ?- Atrial septum: No defect or patent foramen ovale was identified.  ?- Tricuspid valve: There was trivial regurgitation.  ?- Pulmonary arteries: PA peak pressure: 19 mm Hg (S).  ? ? ?EKG:  EKG was personally reviewed ?12/06/21: not ordered today ?06/27/2021: sinus rhythm at 69 bpm with first degree AV block, low voltage, PRWP ?01/24/2020: sinus  rhythm, first degree AV block, PVC/PAC, and low voltage. ? ?Recent Labs: ?11/08/2021: TSH 0.07 ?12/04/2021: ALT 15; B Natriuretic Peptide 66.3; BUN 33; Creatinine, Ser 1.07; Hemoglobin 14.6; Magnesium 2.3; Platelets 242; Potassium 4.0; Sodium 140  ? ?Recent Lipid Panel ?   ?Component Value Date/Time  ? CHOL 192 09/27/2021 1023  ? TRIG 139.0 09/27/2021 1023  ? TRIG 78 06/09/2006 0945  ? HDL 53.60 09/27/2021 1023  ? CHOLHDL 4 09/27/2021 1023  ? VLDL 27.8 09/27/2021 1023  ? LDLCALC 110 (H) 09/27/2021 1023  ? LDLCALC 138 (H) 03/23/2020 1027  ? LDLDIRECT 134.0 11/06/2016 1159  ? ? ?Physical Exam:   ? ?VS:  BP 128/86 (BP Location: Right Arm, Patient Position: Sitting, Cuff Size: Normal)   Pulse (!) 57   Ht 5' (1.524 m)   Wt 119 lb 12.8 oz (54.3 kg)   SpO2 96%   BMI 23.40 kg/m?    ? ?Wt Readings from Last 3 Encounters:  ?12/06/21 119 lb 12.8 oz (54.3 kg)  ?11/28/21 122 lb 6.4 oz (55.5 kg)  ?06/27/21 122 lb 8 oz (55.6 kg)  ?  ?GEN: Well nourished, well developed in no acute distress ?HEENT: Normal,  moist mucous membranes ?NECK: No JVD ?CARDIAC: regular rhythm, normal S1 and S2, no rubs or gallops. 1/6 systolic murmur. ?VASCULAR: Radial and DP pulses 2+ bilaterally. No carotid bruits ?RESPIRATORY:  Clear t

## 2021-12-06 ENCOUNTER — Ambulatory Visit (HOSPITAL_BASED_OUTPATIENT_CLINIC_OR_DEPARTMENT_OTHER): Payer: Medicare HMO | Admitting: Cardiology

## 2021-12-06 VITALS — BP 128/86 | HR 57 | Ht 60.0 in | Wt 119.8 lb

## 2021-12-06 DIAGNOSIS — R0602 Shortness of breath: Secondary | ICD-10-CM | POA: Diagnosis not present

## 2021-12-06 DIAGNOSIS — R002 Palpitations: Secondary | ICD-10-CM | POA: Diagnosis not present

## 2021-12-06 DIAGNOSIS — R011 Cardiac murmur, unspecified: Secondary | ICD-10-CM

## 2021-12-06 DIAGNOSIS — Z7189 Other specified counseling: Secondary | ICD-10-CM | POA: Diagnosis not present

## 2021-12-06 NOTE — Patient Instructions (Signed)
Medication Instructions:  ?Your Physician recommend you continue on your current medication as directed.   ? ?*If you need a refill on your cardiac medications before your next appointment, please call your pharmacy* ? ? ?Lab Work: ?None ordered today ? ? ?Testing/Procedures: ?None ordered today ? ? ?Follow-Up: ?At [facility], you and your health needs are our priority.  As part of our continuing mission to provide you with exceptional heart care, we have created designated Provider Care Teams.  These Care Teams include your primary Cardiologist (physician) and Advanced Practice Providers (APPs -  Physician Assistants and Nurse Practitioners) who all work together to provide you with the care you need, when you need it. ? ?We recommend signing up for the patient portal called "MyChart".  Sign up information is provided on this After Visit Summary.  MyChart is used to connect with patients for Virtual Visits (Telemedicine).  Patients are able to view lab/test results, encounter notes, upcoming appointments, etc.  Non-urgent messages can be sent to your provider as well.   ?To learn more about what you can do with MyChart, go to [email or web address].   ? ?Your next appointment:   ?3 month(s) ? ?The format for your next appointment:   ?In Person ? ?Provider:   ?Buford Dresser, MD{ ? ?Important Information About Sugar ? ? ? ? ? ? ?

## 2021-12-14 ENCOUNTER — Ambulatory Visit: Payer: Medicare HMO | Admitting: Internal Medicine

## 2021-12-14 ENCOUNTER — Telehealth: Payer: Self-pay | Admitting: Internal Medicine

## 2021-12-14 ENCOUNTER — Encounter: Payer: Self-pay | Admitting: Internal Medicine

## 2021-12-14 DIAGNOSIS — R058 Other specified cough: Secondary | ICD-10-CM

## 2021-12-14 DIAGNOSIS — J929 Pleural plaque without asbestos: Secondary | ICD-10-CM | POA: Diagnosis not present

## 2021-12-14 MED ORDER — BENZONATATE 200 MG PO CAPS: 200.0000 mg | ORAL_CAPSULE | Freq: Three times a day (TID) | ORAL | 11 refills | Status: DC | PRN

## 2021-12-14 NOTE — Assessment & Plan Note (Addendum)
Never smoker/ asbestos exp age [age] at her home where father installed in ceiling  ?   - CT Chest 11/08/10 classic changes of pleural plaques   ?   - PFT's wnl 01/15/11 ?   - CT chest repeat  06/18/2019  No change  ?   - CTa repeat 12/04/21 no change  ? ?Risk if very low at this point of either asbestosis or mesothelioma in this never smoker  ? ?  ? ?Each maintenance medication was reviewed in detail including emphasizing most importantly the difference between maintenance and prns and under what circumstances the prns are to be triggered using an action plan format where appropriate. ? ?Total time for H and P, chart review, counseling, reviewing hfa device(s) and generating customized AVS unique to this office visit / same day charting  > 30 min summary final pulmonary ov. ?     ? ? ? ?

## 2021-12-14 NOTE — Assessment & Plan Note (Addendum)
Onset spring 7588  ?- cyclical cough rx wth tesalon/ 1st gen H1 blockers per guidelines > improved  07/21/2019  ?- Allergy profile 06/16/2019 >  Eos 0.2 /  IgE 4 RAST neg    ?- Sinus CT 06/18/2019 >>>  wnl  ?- 12/04/21 flare to point of sob/choking sensation>  12/14/2021 referred to [facility]/ Dr Bettina Gavia  ? ?I don't think is asthma at all but rather classic  Upper airway cough syndrome (previously labeled PNDS),  is so named because it's frequently impossible to sort out how much is  CR/sinusitis with freq throat clearing (which can be related to primary GERD)   vs  causing  secondary (" extra esophageal")  GERD from wide swings in gastric pressure that occur with throat clearing, often  promoting self use of mint and menthol lozenges that reduce the lower esophageal sphincter tone and exacerbate the problem further in a cyclical fashion.  ? ?These are the same pts (now being labeled as having "irritable larynx syndrome" by some cough centers) who not infrequently have a history of having failed to tolerate ace inhibitors,  dry powder inhalers or biphosphonates or report having atypical/extraesophageal reflux symptoms that don't respond to standard doses of PPI  and are easily confused as having aecopd or asthma flares by even experienced allergists/ pulmonologists (myself included).  ? ?Unfortunately says can't tol gabapentin even at 100 mg hs per chart review ? ?rec gerd diet/ hard rock candy / delsym and tessalon 200 mg prn pending f/u with ENT and here prn  ? ? ?    ? ? ? ?

## 2021-12-14 NOTE — Telephone Encounter (Signed)
Spoke with the pt and notified of recommendations from Dr Melvyn Novas. She verbalized understanding. Nothing further needed.  ?

## 2021-12-14 NOTE — Telephone Encounter (Signed)
Chart review indicates tessalon worked well so if delsym not helpful can try tessalon or even add the two together for really bad cough and I already sent it in  ?

## 2021-12-14 NOTE — Patient Instructions (Addendum)
Take delsym two tsp every 12 hours and supplement if needed with tessalon 200 mg every 6 hours    ? ?Swallowing water and/or using ice chips/non mint and menthol containing candies (such as lifesavers or sugarless jolly ranchers) are also effective.  You should rest your voice and avoid activities that you know make you cough. ? ? Also avoid of late meals, excessive alcohol, and avoid fatty foods, chocolate, peppermint, colas, red wine, and acidic juices such as orange juice.  ?NO MINT OR MENTHOL PRODUCTS SO NO COUGH DROPS  ?USE SUGARLESS CANDY INSTEAD (Jolley ranchers or Stover's or Life Savers) or even ice chips will also do - the key is to swallow to prevent all throat clearing. ?NO OIL BASED VITAMINS - use powdered substitutes.  Avoid fish oil when coughing.  ? ?I will be referring you to Dr Bettina Gavia at [facility] ? ?Pulmonary follow up is as needed  ?    ?

## 2021-12-14 NOTE — Progress Notes (Signed)
? ?Emily [name], female    DOB: 11-19-1940,   MRN: [id number] ? ? ?Brief patient profile:  81 yowf never smoker placed  on inhaler around 2005 for sob  but only took it a few weeks and the problem did not recur then onset of watery rhinitis and sneezing but  perennial x around 2014 p [name] present house in [facility] 2012 and started zyrtec d and helped some but still daily symptoms but much worse spring 2020  And assoc with sob and need to clear the throat  And sob better on saba but not the urge to clear throat so referred to pulmonary clinic 06/16/2019 by Dr  [name]  ? ?Has asbestos exp since age [age] in her home  ? ? ?History of Present Illness  ?06/16/2019  Pulmonary/ 1st office eval/[name]   re [facility] since 2019  ?Chief Complaint  ?Patient presents with  ? Pulmonary Consult  ?  Referred by Dr. Billey [name]. Pt c/o SOB since April 2020. She states SOB comes and goes and is associated with her PND.   ?Dyspnea:  Assoc with cough and urge to clear throat some better with saba  ?Walking up to a mile outside some hills  ?Cough: urge to throat / can't get mucus up, feels globus sensation - did bring up slt yellow mucus resolved on amox but not the globus sensation ?Sleep: flat on side one pillow  ?SABA use: twice weekly helps some  ?Now having overt hb/ belching started well p the chronic cough  ?rec ?For "cough" try tessalon pearls 200 mg every 6-8 hours as needed with goal of no coughing or clearing the throat   ?For drainage / throat tickle stop zyrtec D  try take CHLORPHENIRAMINE  4 mg  (Chlortab '4mg'$    ?Pantoprazole (protonix) 40 mg   Take  30-60 min before first meal of the day and Pepcid (famotidine)  20 mg one @  bedtime until return to office  ?GERD diet  ?Please schedule a follow up office visit in 4 weeks, sooner if needed  with all medications /inhalers/ solutions in hand so we can verify exactly what you are taking. This includes all medications from all doctors  and over the counters ? ? ?07/21/2019  f/u ov/Em[name] re: rhinitis/ pnds since spring 2014 and much worse since spring 2020  ?Chief Complaint  ?Patient presents with  ? Follow-up  ?  Breathing has improved and no new co's.   ?Dyspnea:  Not limited by breathing from desired activities  / walks around neighborhood ok  ?Cough: no cough now  but still senses excess drainage esp first thing in am / not using chlorpheniramine correctly, atrovent helps some- thinks chlorpheniramine works better than zyrtec ?Sleeping: fine flat/ one pillow  ?SABA use: none  ?02: none  ?Still belching, overt "hb" on ppi, says [name] gave her carafate 2017 and helped a lot better than ppi  ?Rec ?carafate 1 gm up to four times daily as needed  ?Stop pantoprazole  ?Continue pepcid at bedtime  ?If not satisfied you will need to return to GI  ? ? ?12/14/2021  f/u ov/Em[name] re: rhinitis maint on chlorpheniramine up to 3 times daily and completely off all reflux x > 1 year ?Chief Complaint  ?Patient presents with  ? Acute Visit  ?  Cough has been worse the past few days.  She feels like sputum getting hung in her throat.   ?  Dyspnea:  Had one event doe  where walked up steps    12/04/21 couldn't catch her breath /choking sensation and worse p tried saba so > ER neg w/u and d/c on medrol  ?Cough: better while on medrol but still urge to clear throat  ?Sleeping: 30  degrees electric bed s problems  ?SABA use: did not help ?02: none  ?Covid status:   vax x 3  ?Using caramel candy and chocolate  ?Has used gabapentin bad reaction at 100 mg hs / never seen by ent  ? ? ?No obvious day to day or daytime variability or assoc excess/ purulent sputum or mucus plugs or hemoptysis or cp or chest tightness, subjective wheeze or overt  hb symptoms.  ? ?Sleeping  without nocturnal  or early am exacerbation  of respiratory  c/o's or need for noct saba. Also denies any obvious fluctuation of symptoms with weather or environmental changes or other aggravating or  alleviating factors except as outlined above  ? ?No unusual exposure hx or h/o childhood pna/ asthma or knowledge of premature birth. ? ?Current Allergies, Complete Past Medical History, Past Surgical History, Family History, and Social History were reviewed in [employer] record. ? ?ROS  The following are not active complaints unless bolded ?Hoarseness, sore throat, dysphagia, dental problems, itching, sneezing,  nasal congestion  and sense of discharge of excess mucus or purulent secretions, ear ache,   fever, chills, sweats, unintended wt loss or wt gain, classically pleuritic or exertional cp,  orthopnea pnd or arm/hand swelling  or leg swelling, presyncope, palpitations, abdominal pain, anorexia, nausea, vomiting, diarrhea  or change in bowel habits or change in bladder habits, change in stools or change in urine, dysuria, hematuria,  rash, arthralgias, visual complaints, headache, numbness, weakness or ataxia or problems with walking or coordination,  change in mood or  memory. ?      ? ?Current Meds  ?Medication Sig  ? albuterol (VENTOLIN HFA) 108 (90 Base) MCG/ACT inhaler Inhale 2 puffs into the lungs every 6 (six) hours as needed for wheezing or shortness of breath.  ? atenolol (TENORMIN) 25 MG tablet TAKE 1 TABLET BY MOUTH EVERY DAY  ? Calcium-Vitamin D-Vitamin K 741-287-86 MG-UNT-MCG TABS Take 1 tablet by mouth 2 (two) times daily.  ? chlorpheniramine (CHLOR-TRIMETON) 4 MG tablet Take 4 mg by mouth 2 (two) times daily as needed for allergies.  ? cholecalciferol (VITAMIN D) 25 MCG (1000 UNIT) tablet Take 1,000 Units by mouth daily.  ? [employer] ALLERGY RELIEF-D 5-120 MG tablet Take 1 tablet by mouth 2 (two) times daily.  ? EPINEPHrine 0.3 mg/0.3 mL IJ SOAJ injection Inject 0.3 mg into the muscle as needed for anaphylaxis.  ? ipratropium (ATROVENT) 0.03 % nasal spray INSTILL 2 SPRAYS IN EACH NOSTRIL EVERY 12 HOURS  ? levothyroxine (SYNTHROID) 75 MCG tablet Take 1 tablet daily 30 minutes  before breakfast 5 days a week  ? levothyroxine (SYNTHROID) 88 MCG tablet Take 1 tablet daily 30 minutes prior to breakfast twice weekly  ? triamcinolone (KENALOG) 0.025 % ointment Apply 1 application topically 2 (two) times daily. Use as needed.  ?    ? ?  ?  ? ? ? ?  ?      ?   ? ?Past Medical History:  ?Diagnosis Date  ? Actinic keratosis   ? Allergic rhinitis   ? BACK PAIN   ? CARCINOMA, BREAST, 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  ? L s/p lumpectomy, chemo and XRT  ?  Cervicalgia   ? Cholelithiasis   ? DIVERTICULOSIS, COLON   ? ENDOMETRIOSIS   ? GERD (gastroesophageal reflux disease)   ? Heart palpitations   ? Hypertension   ? HYPOTHYROIDISM   ? Left wrist fracture 11/06/2016  ? 10/2016 - fell of high stool  ? Personal history of chemotherapy   ? Personal history of radiation therapy   ? Pleural plaque without asbestos   ? CT chest 10/2010: R>L lobes, upper> lower  ? Squamous cell carcinoma   ? History of BC  ? ? ?  ? ?Objective:  ?  ? ? ? 12/14/2021      122   ?07/21/19 120 lb 9.6 oz (54.7 kg)  ?06/16/19 122 lb (55.3 kg)  ?05/18/19 121 lb (54.9 kg)  ?   ?Vital signs reviewed  12/14/2021  - Note at rest 02 sats  97% on RA  ? ?General appearance:    pleasant amb wf throat clearing   ? ? HEENT : oropharynx pristine s pnd, nl turbinates bilaterally  ? ? ?NECK :  without JVD/Nodes/TM/ nl carotid upstrokes bilaterally ? ? ?LUNGS: no acc muscle use,  Nl contour chest which is clear to A and P bilaterally without cough on insp or exp maneuvers ? ? ?CV:  RRR  no s3 or murmur or increase in P2, and no edema  ? ?ABD:  soft and nontender with nl inspiratory excursion in the supine position. No bruits or organomegaly appreciated, bowel sounds nl ? ?MS:  Nl gait/ ext warm without deformities, calf tenderness, cyanosis or clubbing ?No obvious joint restrictions  ? ?SKIN: warm and dry without lesions   ? ?NEURO:  alert, approp, nl sensorium with  no motor or cerebellar deficits apparent.  ?  ? ? ?I personally reviewed images and agree  with radiology impression as follows:  ? Chest CTa 12/04/21 ?1. Negative for pulmonary embolism. ?2. Multiple areas of pleural plaque with variable degrees of ?calcification compatible with asbestos related pleu

## 2021-12-21 ENCOUNTER — Telehealth: Payer: Self-pay | Admitting: Cardiology

## 2021-12-21 NOTE — Telephone Encounter (Signed)
Pt c/o Shortness Of Breath: STAT if SOB developed within the last 24 hours or pt is noticeably SOB on the phone ? ?1. Are you currently SOB (can you hear that pt is SOB on the phone)?  ?No  ? ?2. How long have you been experiencing SOB?  ?About 1 month and 1 week ? ?3. Are you SOB when sitting or when up moving around?  ?Both, but mainly when up and moving around  ? ?4. Are you currently experiencing any other symptoms?  ?No  ? ?

## 2021-12-21 NOTE — Telephone Encounter (Signed)
Spoke with patient regarding shortness of breath, has at rest and with exertion.  ?Per patient when she saw Dr Harrell Gave last  month was having shortness of breath that was discussed ?Shortness of breath has not gotten any better but not worse.  ?Patient just wanted to know what next steps  ? ?Aware Dr Harrell Gave not in office today and working at hospital next week ? ?Will forward to Dr Harrell Gave for review  ?

## 2021-12-22 ENCOUNTER — Other Ambulatory Visit: Payer: Self-pay | Admitting: Internal Medicine

## 2021-12-23 ENCOUNTER — Encounter (HOSPITAL_BASED_OUTPATIENT_CLINIC_OR_DEPARTMENT_OTHER): Payer: Self-pay | Admitting: Cardiology

## 2021-12-24 ENCOUNTER — Ambulatory Visit: Payer: Medicare HMO | Admitting: Nurse Practitioner

## 2021-12-24 ENCOUNTER — Encounter: Payer: Self-pay | Admitting: Nurse Practitioner

## 2021-12-24 VITALS — BP 109/73 | HR 83 | Ht 61.0 in | Wt 121.2 lb

## 2021-12-24 DIAGNOSIS — I351 Nonrheumatic aortic (valve) insufficiency: Secondary | ICD-10-CM | POA: Diagnosis not present

## 2021-12-24 DIAGNOSIS — R0602 Shortness of breath: Secondary | ICD-10-CM | POA: Diagnosis not present

## 2021-12-24 DIAGNOSIS — R002 Palpitations: Secondary | ICD-10-CM

## 2021-12-24 DIAGNOSIS — I10 Essential (primary) hypertension: Secondary | ICD-10-CM | POA: Diagnosis not present

## 2021-12-24 MED ORDER — METOPROLOL TARTRATE 100 MG PO TABS: ORAL_TABLET | ORAL | 0 refills | Status: DC

## 2021-12-24 NOTE — Telephone Encounter (Signed)
Spoke with patient and he shortness of breath has gotten worse over the weekend ?When she had spoken with Dr Harrell Gave at last office visit she had mentioned doing an US/CT to check for blockages.  ?Scheduled patient and appointment with Fabio Asa NP at [facility] today at 10:15 am ?Patient aware of time and location  ?

## 2021-12-24 NOTE — Telephone Encounter (Signed)
Patient called again and said her SOB is still present but feels worse today. She was just told to call Dr. Harrell Gave  ?

## 2021-12-24 NOTE — Patient Instructions (Signed)
Medication Instructions:  ?Metotprlol 100 mg 2 hours prior to scan ?*If you need a refill on your cardiac medications before your next appointment, please call your pharmacy* ? ? ?Lab Work: ?Your physician recommends that you return for lab work 1 week before scan ?BMET ? ?If you have labs (blood work) drawn today and your tests are completely normal, you will receive your results only by: ?MyChart Message (if you have MyChart) OR ?A paper copy in the mail ?If you have any lab test that is abnormal or we need to change your treatment, we will call you to review the results. ? ? ?Testing/Procedures: ?Your physician has requested that you have an echocardiogram. Echocardiography is a painless test that uses sound waves to create images of your heart. It provides your doctor with information about the size and shape of your heart and how well your heart?s chambers and valves are working. This procedure takes approximately one hour. There are no restrictions for this procedure.  ? ? ? ?Your cardiac CT will be scheduled at one of the below locations:  ? ?[facility] ?[address] ?[place], [place] [address] ?[phone] ? ?OR ? ?[facility] ?[address] ?[address] ?[place], [place] [address] ?[phone] ? ?If scheduled at [facility], please arrive at the [facility] and Children's Entrance (Entrance C2) of [facility] 30 minutes prior to test start time. ?You can use the FREE valet parking offered at entrance C (encouraged to control the heart rate for the test)  ?Proceed to the [facility] Radiology Department (first floor) to check-in and test prep. ? ?All radiology patients and guests should use entrance C2 at [facility], accessed from [facility], even though the hospital's physical address listed is [address] ? ? ? ?If scheduled at [facility], please arrive 15 mins early for check-in and  test prep. ? ?Please follow these instructions carefully (unless otherwise directed): ? ?Hold all erectile dysfunction medications at least 3 days (72 hrs) prior to test. ? ?On the Night Before the Test: ?Be sure to Drink plenty of water. ?Do not consume any caffeinated/decaffeinated beverages or chocolate 12 hours prior to your test. ?Do not take any antihistamines 12 hours prior to your test. ?If the patient has contrast allergy: ?Patient will need a prescription for Prednisone and very clear instructions (as follows): ?Prednisone 50 mg - take 13 hours prior to test ?Take another Prednisone 50 mg 7 hours prior to test ?Take another Prednisone 50 mg 1 hour prior to test ?Take Benadryl 50 mg 1 hour prior to test ?Patient must complete all four doses of above prophylactic medications. ?Patient will need a ride after test due to Benadryl. ? ?On the Day of the Test: ?Drink plenty of water until 1 hour prior to the test. ?Do not eat any food 4 hours prior to the test. ?You may take your regular medications prior to the test.  ?Take metoprolol (Lopressor) two hours prior to test. ?HOLD Furosemide/Hydrochlorothiazide morning of the test. ?FEMALES- please wear underwire-free bra if available, avoid dresses & tight clothing ? ? ?*For Clinical Staff only. Please instruct patient the following:* ?Heart Rate Medication Recommendations for Cardiac CT  ?Resting HR < 50 bpm  ?No medication  ?Resting HR 50-60 bpm and BP >110/50 mmHG   ?Consider Metoprolol tartrate 25 mg PO 90-120 min prior to scan  ?Resting HR 60-65 bpm and BP >110/50 mmHG  ?Metoprolol tartrate  50 mg PO 90-120 minutes prior to scan   ?Resting HR > 65 bpm and BP >110/50 mmHG  ?Metoprolol tartrate 100 mg PO 90-120 minutes prior to scan  ?Consider Ivabradine 10-15 mg PO or a calcium channel blocker for resting HR >60 bpm and contraindication to metoprolol tartrate  ?Consider Ivabradine 10-15 mg PO in combination with metoprolol tartrate for HR >80 bpm   ? ?      ?After the Test: ?Drink plenty of water. ?After receiving IV contrast, you may experience a mild flushed feeling. This is normal. ?On occasion, you may experience a mild rash up to 24 hours after the test. This is not dangerous. If this occurs, you can take Benadryl 25 mg and increase your fluid intake. ?If you experience trouble breathing, this can be serious. If it is severe call 911 IMMEDIATELY. If it is mild, please call our office. ?If you take any of these medications: Glipizide/Metformin, Avandament, Glucavance, please do not take 48 hours after completing test unless otherwise instructed. ? ?We will call to schedule your test 2-4 weeks out understanding that some insurance companies will need an authorization prior to the service being performed.  ? ?For non-scheduling related questions, please contact the cardiac imaging nurse navigator should you have any questions/concerns: ?Marchia Bond, Cardiac Imaging Nurse Navigator ?Gordy Clement, Cardiac Imaging Nurse Navigator ?[facility] ?Direct Office Dial: [phone]  ? ?For scheduling needs, including cancellations and rescheduling, please call [place], [phone]. ? ? ? ?Follow-Up: ?At [facility], you and your health needs are our priority.  As part of our continuing mission to provide you with exceptional heart care, we have created designated Provider Care Teams.  These Care Teams include your primary Cardiologist (physician) and Advanced Practice Providers (APPs -  Physician Assistants and Nurse Practitioners) who all work together to provide you with the care you need, when you need it. ? ?We recommend signing up for the patient portal called "MyChart".  Sign up information is provided on this After Visit Summary.  MyChart is used to connect with patients for Virtual Visits (Telemedicine).  Patients are able to view lab/test results, encounter notes, upcoming appointments, etc.  Non-urgent messages can be sent to your  provider as well.   ?To learn more about what you can do with MyChart, go to [email or web address].   ? ?Your next appointment:   ?6-8  week(s) ? ?The format for your next appointment:   ?In Person ? ?Provider:   ?Buford Dresser, MD  ? ? ?Other Instructions ? ? ?Important Information About Sugar ? ? ? ? ? ? ? ?

## 2021-12-24 NOTE — Progress Notes (Signed)
? ? ?Office Visit  ?  ?Patient Name: Emily [name] ?Date of Encounter: 12/24/2021 ? ?Primary Care Provider:  Binnie Rail, MD ?Primary Cardiologist:  Buford Dresser, MD ? ?Chief Complaint  ?  ?81 year old female with a history of palpitations, shortness of breath, presyncope, aortic valve regurgitation, mitral valve prolapse, and hypertension who presents for follow-up related to shortness of breath. ? ?Past Medical History  ?  ?Past Medical History:  ?Diagnosis Date  ? Actinic keratosis   ? Allergic rhinitis   ? BACK PAIN   ? CARCINOMA, BREAST, 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  ? L s/p lumpectomy, chemo and XRT  ? Cervicalgia   ? Cholelithiasis   ? DIVERTICULOSIS, COLON   ? ENDOMETRIOSIS   ? GERD (gastroesophageal reflux disease)   ? Heart palpitations   ? Hypertension   ? HYPOTHYROIDISM   ? Left wrist fracture 11/06/2016  ? 10/2016 - fell of high stool  ? Personal history of chemotherapy   ? Personal history of radiation therapy   ? Pleural plaque without asbestos   ? CT chest 10/2010: R>L lobes, upper> lower  ? Squamous cell carcinoma   ? History of BC  ? ?Past Surgical History:  ?Procedure Laterality Date  ? BREAST LUMPECTOMY  2001  ? left  ? CATARACT EXTRACTION Bilateral 09/2014  ? both eyes  ? CHOLECYSTECTOMY  2010  ? ENDOMETRIAL ABLATION    ? EXCISION / BIOPSY BREAST / NIPPLE / DUCT Left 2001  ? ? ?Allergies ? ?Allergies  ?Allergen Reactions  ? Molnupiravir Other (See Comments)  ?  Tongue swelling  ? Allegra [Fexofenadine]   ? Gabapentin   ? Levaquin [Levofloxacin] Swelling and Other (See Comments)  ?  Hallucinations, swelling of throat  ? Nexium [Esomeprazole Magnesium]   ?  diarrhea  ? Valerian Root Other (See Comments)  ?  Loose stools  ? ? ?History of Present Illness  ? ?81 year old female with the above past medical history including palpitations, shortness of breath, aortic valve regurgitation, mitral valve prolapse, presyncope, and hypertension. ? ?Echocardiogram in October 2019 in the setting  of presyncope and palpitations showed EF 60 to 65%, no RWMA, mild aortic valve regurgitation, trivial mitral valve regurgitation. Cardiac monitor at the time showed frequent PSVT, longest episode lasting 16 beats, occasional PVCs. Her palpitations improved with increased atenolol. She was last seen in the office on 12/06/2021 following an ED visit on 12/04/2021 for shortness of breath, lightheadedness, and elevated BP.  CT a of the chest on 12/04/2021 was negative for PE, but did show multiple areas of pleural plaque with variable degrees of calcification compatible with asbestos related pleural disease, small sliding-type hiatal hernia. EKG at time of ED visit was unremarkable, troponin was negative. Her symptoms were thought to be noncardiac in origin.  Her PCP gave her a steroid pack and she noted a slight improvement in her symptoms.  He saw her pulmonologist on 12/14/2021.  She was prescribed Ladona Ridgel and referred to ENT.  GERD diet was also recommended.  She contacted our office on 12/21/2021 with complaints of ongoing shortness of breath at rest and with exertion.   ? ?She presents today for follow-up. Since her last visit she reports ongoing shortness of breath with exertion, and occasionally at rest. When her symptoms occur, she states that she feels like she cannot take a good deep breath, she also feels lightheaded. Her symptoms last for seconds at a time and resolve spontaneously. She denies any chest pain, palpitations, presyncope,  syncope, edema, PND, orthopnea.  Other than her ongoing shortness of breath, she denies any additional concerns today. ? ?Home Medications  ?  ?Current Outpatient Medications  ?Medication Sig Dispense Refill  ? atenolol (TENORMIN) 25 MG tablet TAKE 1 TABLET BY MOUTH EVERY DAY 90 tablet 1  ? benzonatate (TESSALON) 200 MG capsule Take 1 capsule (200 mg total) by mouth 3 (three) times daily as needed for cough. 30 capsule 11  ? Calcium-Vitamin D-Vitamin K 034-742-59 MG-UNT-MCG  TABS Take 1 tablet by mouth 2 (two) times daily.    ? cholecalciferol (VITAMIN D) 25 MCG (1000 UNIT) tablet Take 1,000 Units by mouth daily.    ? [employer] ALLERGY RELIEF-D 5-120 MG tablet Take 1 tablet by mouth 2 (two) times daily.    ? EPINEPHrine 0.3 mg/0.3 mL IJ SOAJ injection Inject 0.3 mg into the muscle as needed for anaphylaxis. 1 each 0  ? ipratropium (ATROVENT) 0.03 % nasal spray INSTILL 2 SPRAYS IN EACH NOSTRIL EVERY 12 HOURS 30 mL 5  ? levothyroxine (SYNTHROID) 75 MCG tablet Take 1 tablet daily 30 minutes before breakfast 5 days a week 60 tablet 3  ? levothyroxine (SYNTHROID) 88 MCG tablet Take 1 tablet daily 30 minutes prior to breakfast twice weekly 24 tablet 3  ? triamcinolone (KENALOG) 0.025 % ointment Apply 1 application topically 2 (two) times daily. Use as needed. 30 g 1  ? albuterol (VENTOLIN HFA) 108 (90 Base) MCG/ACT inhaler Inhale 2 puffs into the lungs every 6 (six) hours as needed for wheezing or shortness of breath. (Patient not taking: Reported on 12/24/2021) 8 g 0  ? chlorpheniramine (CHLOR-TRIMETON) 4 MG tablet Take 4 mg by mouth 2 (two) times daily as needed for allergies.    ? ?No current facility-administered medications for this visit.  ?  ? ?Review of Systems  ?  ?She denies chest pain, palpitations, pnd, orthopnea, n, v, syncope, edema, weight gain, or early satiety. All other systems reviewed and are otherwise negative except as noted above.  ? ?Physical Exam  ?  ?VS:  BP 109/73   Pulse 83   Ht '5\' 1"'$  (1.549 m)   Wt 121 lb 3.2 oz (55 kg)   SpO2 96%   BMI 22.90 kg/m?  ?GEN: Well nourished, well developed, in no acute distress. ?HEENT: normal. ?Neck: Supple, no JVD, carotid bruits, or masses. ?Cardiac: RRR, 1/6 systolic murmur, no rubs, or gallops. No clubbing, cyanosis, edema.  Radials/DP/PT 2+ and equal bilaterally.  ?Respiratory:  Respirations regular and unlabored, clear to auscultation bilaterally. ?GI: Soft, nontender, nondistended, BS + x 4. ?MS: no deformity or atrophy. ?Skin:  warm and dry, no rash. ?Neuro:  Strength and sensation are intact. ?Psych: Normal affect. ? ?Accessory Clinical Findings  ?  ?ECG personally reviewed by me today - No EKG in office today-patient declined- no acute changes. ? ?Lab Results  ?Component Value Date  ? WBC 12.2 (H) 12/04/2021  ? HGB 14.6 12/04/2021  ? HCT 44.8 12/04/2021  ? MCV 88.9 12/04/2021  ? PLT 242 12/04/2021  ? ?Lab Results  ?Component Value Date  ? CREATININE 1.07 (H) 12/04/2021  ? BUN 33 (H) 12/04/2021  ? NA 140 12/04/2021  ? K 4.0 12/04/2021  ? CL 104 12/04/2021  ? CO2 29 12/04/2021  ? ?Lab Results  ?Component Value Date  ? ALT 15 12/04/2021  ? AST 16 12/04/2021  ? ALKPHOS 72 12/04/2021  ? BILITOT 0.4 12/04/2021  ? ?Lab Results  ?Component Value Date  ? CHOL 192  09/27/2021  ? HDL 53.60 09/27/2021  ? LDLCALC 110 (H) 09/27/2021  ? LDLDIRECT 134.0 11/06/2016  ? TRIG 139.0 09/27/2021  ? CHOLHDL 4 09/27/2021  ?  ?Lab Results  ?Component Value Date  ? HGBA1C 5.9 09/27/2021  ? ? ?Assessment & Plan  ?  ?1. Shortness of breath: Echo in October 2019 showed EF 60 to 65%, no RWMA, mild aortic valve regurgitation, trivial mitral valve regurgitation. She reports a 6-week history of ongoing shortness of breath with exertion and occasionally at rest, with associated lightheadedness. She denies palpitations, chest pain, presyncope, syncope, edema, PND, orthopnea. Euvolemic and well compensated on exam.  She completed a dose of formal steroids and noted a slight improvement in her symptoms while on this medication, however, her symptoms quickly returned. She has been evaluated by pulmonology and was referred to ENT. Given ongoing exertional dyspnea and through shared decision making, will pursue coronary CTA to rule out ischemic cause of symptoms. Will repeat echocardiogram. BMET 1 week prior to coronary CTA.  ?  ?2. Palpitations: Cardiac monitor in 2019 showed frequent PSVT, longest episode lasting 16 beats, occasional PVCs.  She denies palpitations, managed on  atenolol. ? ?3. Aortic valve regurgitation/reported history of mitral valve prolapse: Most recent echo as above. Repeat echocardiogram pending given ongoing exertional dyspnea. ? ?4. Hypertension: BP well

## 2021-12-26 ENCOUNTER — Encounter: Payer: Self-pay | Admitting: Internal Medicine

## 2021-12-26 NOTE — Progress Notes (Signed)
? ? ?Subjective:  ? ? Patient ID: Emily [name], female    DOB: 12-24-1940, 81 y.o.   MRN: [id number] ? ? ? ? ? ?HPI ?Emily [name] is here for  ?Chief Complaint  ?Patient presents with  ? Breathing Problem  ? ? ?Breathing issues -  ? ? ?4/12 - she was here for breathing issues.  It was worse first thing in the morning, got better as day progressed.  Worse with exertion.  Has mucus in throat that will not come up, occasional dry cough, PND, voice crackles.  Was taking allergy medications - oral anti-histamine and nasal spray.  Her symptoms did not improve after medrol dose pak, but the shortness of breath may have improved a little.   ? ?4/18 - ED for SOB x 3 weeks. SOB was intermittent and associated with lightheadedness.  BP elevated at these times.  She had orthopnea.  CXR neg, except pleural plqaues.  Ct Angio chest - no PE, pleural plaques.  ? ?4/20 - cardiology - SOB at rest and exertion.  SOB not thought to be cardiac in nature.   ? ?4/28 - pulmonary - Dr Melvyn Novas - diagnosed with irritable larynx syndrome.  ? ?SOB had gotten worse - SOB lasts seconds and the resolves. Pos lightheadedness.  ? ? ?5/5 - cardiology - CTA coronary ordered.  Echo ordered.   ? ?Taking chlorpheniramine 4 mg  ? ? ?She can not cough anything up but when she does there is a slight yellow discoloration.  The medrol did help a little with the SOB.  It did not help with the cough.  Yesterday she tried the albuterol and it did help.   ? ?SOB comes with activity, for example when she was changing the sheets the other day she could not catch her breath and took albuterol.   ? ? ?Medications and allergies reviewed with patient and updated if appropriate. ? ?Current Outpatient Medications on File Prior to Visit  ?Medication Sig Dispense Refill  ? atenolol (TENORMIN) 25 MG tablet TAKE 1 TABLET BY MOUTH EVERY DAY 90 tablet 1  ? benzonatate (TESSALON) 200 MG capsule Take 1 capsule (200 mg total) by mouth 3 (three) times daily as needed for cough. 30  capsule 11  ? Calcium-Vitamin D-Vitamin K 976-734-19 MG-UNT-MCG TABS Take 1 tablet by mouth 2 (two) times daily.    ? chlorpheniramine (CHLOR-TRIMETON) 4 MG tablet Take 4 mg by mouth 2 (two) times daily as needed for allergies.    ? cholecalciferol (VITAMIN D) 25 MCG (1000 UNIT) tablet Take 1,000 Units by mouth daily.    ? [employer] ALLERGY RELIEF-D 5-120 MG tablet Take 1 tablet by mouth 2 (two) times daily.    ? EPINEPHrine 0.3 mg/0.3 mL IJ SOAJ injection Inject 0.3 mg into the muscle as needed for anaphylaxis. 1 each 0  ? ipratropium (ATROVENT) 0.03 % nasal spray INHALE 2 SPRAYS IN EACH NOSTRIL EVERY 12 HOURS 30 mL 1  ? levothyroxine (SYNTHROID) 75 MCG tablet Take 1 tablet daily 30 minutes before breakfast 5 days a week 60 tablet 3  ? levothyroxine (SYNTHROID) 88 MCG tablet Take 1 tablet daily 30 minutes prior to breakfast twice weekly 24 tablet 3  ? metoprolol tartrate (LOPRESSOR) 100 MG tablet Take 1 tablet 2 hours prior to scan 1 tablet 0  ? triamcinolone (KENALOG) 0.025 % ointment Apply 1 application topically 2 (two) times daily. Use as needed. 30 g 1  ? albuterol (VENTOLIN HFA) 108 (90 Base) MCG/ACT inhaler Inhale 2  puffs into the lungs every 6 (six) hours as needed for wheezing or shortness of breath. (Patient not taking: Reported on 12/27/2021) 8 g 0  ? ?No current facility-administered medications on file prior to visit.  ? ? ?Review of Systems  ?Constitutional:  Negative for chills and fever.  ?HENT:  Positive for congestion, postnasal drip and sinus pressure (? mild). Negative for ear pain, sinus pain and sore throat.   ?Respiratory:  Positive for cough (mostly dry, occ productive) and shortness of breath. Negative for chest tightness and wheezing.   ?Cardiovascular:  Negative for chest pain and palpitations.  ?Gastrointestinal:  Negative for nausea.  ?     Denies GERD  ?Neurological:  Positive for light-headedness (when SOB) and headaches (occ mild).  ? ?   ?Objective:  ? ?Vitals:  ? 12/27/21 0807  ?BP:  106/72  ?Pulse: (!) 58  ?Temp: 98 ?F (36.7 ?C)  ?SpO2: 95%  ? ?BP Readings from Last 3 Encounters:  ?12/27/21 106/72  ?12/24/21 109/73  ?12/14/21 110/76  ? ?Wt Readings from Last 3 Encounters:  ?12/24/21 121 lb 3.2 oz (55 kg)  ?12/14/21 122 lb 6.4 oz (55.5 kg)  ?12/06/21 119 lb 12.8 oz (54.3 kg)  ? ?Body mass index is 22.9 kg/m?. ? ?  ?Physical Exam ?Constitutional:   ?   General: She is not in acute distress. ?   Appearance: Normal appearance.  ?HENT:  ?   Head: Normocephalic and atraumatic.  ?   Right Ear: Tympanic membrane, ear canal and external ear normal. There is no impacted cerumen.  ?   Left Ear: Tympanic membrane, ear canal and external ear normal. There is no impacted cerumen.  ?   Mouth/Throat:  ?   Mouth: Mucous membranes are moist.  ?   Pharynx: No oropharyngeal exudate or posterior oropharyngeal erythema.  ?Eyes:  ?   Conjunctiva/sclera: Conjunctivae normal.  ?Cardiovascular:  ?   Rate and Rhythm: Normal rate and regular rhythm.  ?   Heart sounds: Normal heart sounds. No murmur heard. ?Pulmonary:  ?   Effort: Pulmonary effort is normal. No respiratory distress.  ?   Breath sounds: Normal breath sounds. No wheezing.  ?Musculoskeletal:  ?   Cervical back: Neck supple.  ?   Right lower leg: No edema.  ?   Left lower leg: No edema.  ?Lymphadenopathy:  ?   Cervical: No cervical adenopathy.  ?Skin: ?   General: Skin is warm and dry.  ?   Findings: No rash.  ?Neurological:  ?   Mental Status: She is alert. Mental status is at baseline.  ?Psychiatric:     ?   Mood and Affect: Mood normal.     ?   Behavior: Behavior normal.  ? ?   ? ? ? ? ? ?Assessment & Plan:  ? ? ?See Problem List for Assessment and Plan of chronic medical problems.  ? ? ? ? ?

## 2021-12-27 ENCOUNTER — Ambulatory Visit (INDEPENDENT_AMBULATORY_CARE_PROVIDER_SITE_OTHER): Payer: Medicare HMO | Admitting: Internal Medicine

## 2021-12-27 ENCOUNTER — Other Ambulatory Visit: Payer: Self-pay | Admitting: Internal Medicine

## 2021-12-27 DIAGNOSIS — R052 Subacute cough: Secondary | ICD-10-CM

## 2021-12-27 DIAGNOSIS — J019 Acute sinusitis, unspecified: Secondary | ICD-10-CM | POA: Diagnosis not present

## 2021-12-27 DIAGNOSIS — R059 Cough, unspecified: Secondary | ICD-10-CM | POA: Insufficient documentation

## 2021-12-27 DIAGNOSIS — R0602 Shortness of breath: Secondary | ICD-10-CM

## 2021-12-27 MED ORDER — AMOXICILLIN-POT CLAVULANATE 875-125 MG PO TABS: 1.0000 | ORAL_TABLET | Freq: Two times a day (BID) | ORAL | 0 refills | Status: DC

## 2021-12-27 NOTE — Patient Instructions (Addendum)
? ? ? ? ?  Medications changes include :   Augmentin twice a day for 10 days. ? ? ?Your prescription(s) have been sent to your pharmacy.  ? ? ? ?Consider taking a medication for reflux/ acid if you continue to have symptoms.  ?

## 2021-12-27 NOTE — Assessment & Plan Note (Signed)
Subacute ?Started several weeks ago ?Has some allergy/cold symptoms,?  Reactive airway ?We will treat with Augmentin to see if that helps ?Steroids did help shortness of breath a little-we will hold off on additional steroids for now ?Having cardiac work-up-echocardiogram and CTA coronary arteries scheduled ?

## 2021-12-27 NOTE — Assessment & Plan Note (Signed)
Subacute ?Going on for several weeks ?Likely combination of things-allergies, possible sinus infection, possible silent GERD, possible irritable larynx ?Taking allergy medication ?We will treat sinus infection with Augmentin ?If no improvement with Augmentin recommend trying a PPI such as Prilosec ?Has been referred to Dr. Albin Felling for treatment and evaluation of possible irritable larynx ?

## 2021-12-27 NOTE — Assessment & Plan Note (Signed)
Subacute ?Cough that is primarily dry, but occasionally productive of thicker mucus -occasionally yellow in color ?Associated with PND nasal congestion and sinus pressure that is mild ?Has been going on for a while now-several weeks ?We will treat with Augmentin 875-125 mg twice daily x10 days ?Continue allergy medications ?

## 2021-12-30 ENCOUNTER — Emergency Department (HOSPITAL_COMMUNITY): Payer: Medicare HMO

## 2021-12-30 ENCOUNTER — Other Ambulatory Visit: Payer: Self-pay

## 2021-12-30 ENCOUNTER — Emergency Department (HOSPITAL_COMMUNITY)
Admission: EM | Admit: 2021-12-30 | Discharge: 2021-12-30 | Disposition: A | Discharge status: Home / Self Care | Payer: Medicare HMO | Attending: Emergency Medicine | Admitting: Emergency Medicine

## 2021-12-30 ENCOUNTER — Encounter (HOSPITAL_COMMUNITY): Payer: Self-pay

## 2021-12-30 DIAGNOSIS — R7989 Other specified abnormal findings of blood chemistry: Secondary | ICD-10-CM | POA: Diagnosis not present

## 2021-12-30 DIAGNOSIS — R0602 Shortness of breath: Secondary | ICD-10-CM

## 2021-12-30 DIAGNOSIS — R079 Chest pain, unspecified: Secondary | ICD-10-CM | POA: Diagnosis not present

## 2021-12-30 DIAGNOSIS — R0789 Other chest pain: Secondary | ICD-10-CM | POA: Diagnosis not present

## 2021-12-30 LAB — BASIC METABOLIC PANEL
Anion gap: 7 (ref 5–15)
BUN: 22 mg/dL (ref 8–23)
CO2: 25 mmol/L (ref 22–32)
Calcium: 9.5 mg/dL (ref 8.9–10.3)
Chloride: 108 mmol/L (ref 98–111)
Creatinine, Ser: 1.13 mg/dL — ABNORMAL HIGH (ref 0.44–1.00)
GFR, Estimated: 49 mL/min — ABNORMAL LOW (ref >60)
Glucose, Bld: 120 mg/dL — ABNORMAL HIGH (ref 70–99)
Potassium: 4 mmol/L (ref 3.5–5.1)
Sodium: 140 mmol/L (ref 135–145)

## 2021-12-30 LAB — CBC
HCT: 40.4 % (ref 36.0–46.0)
Hemoglobin: 12.9 g/dL (ref 12.0–15.0)
MCH: 29.3 pg (ref 26.0–34.0)
MCHC: 31.9 g/dL (ref 30.0–36.0)
MCV: 91.6 fL (ref 80.0–100.0)
Platelets: 250 10*3/uL (ref 150–400)
RBC: 4.41 MIL/uL (ref 3.87–5.11)
RDW: 13 % (ref 11.5–15.5)
WBC: 9.7 10*3/uL (ref 4.0–10.5)
nRBC: 0 % (ref 0.0–0.2)

## 2021-12-30 LAB — D-DIMER, QUANTITATIVE: D-Dimer, Quant: 0.81 ug/mL-FEU — ABNORMAL HIGH (ref 0.00–0.50)

## 2021-12-30 LAB — BRAIN NATRIURETIC PEPTIDE: B Natriuretic Peptide: 128.2 pg/mL — ABNORMAL HIGH (ref 0.0–100.0)

## 2021-12-30 LAB — TROPONIN I (HIGH SENSITIVITY)
Troponin I (High Sensitivity): 4 ng/L (ref <18)
Troponin I (High Sensitivity): 3 ng/L (ref <18)

## 2021-12-30 MED ORDER — IOHEXOL 350 MG/ML SOLN
65.0000 mL | Freq: Once | INTRAVENOUS | Status: AC | PRN
  Administered 2021-12-30: 65 mL via INTRAVENOUS

## 2021-12-30 MED ORDER — KETOROLAC TROMETHAMINE 30 MG/ML IJ SOLN
15.0000 mg | Freq: Once | INTRAMUSCULAR | Status: AC
  Administered 2021-12-30: 15 mg via INTRAVENOUS
  Filled 2021-12-30: qty 1

## 2021-12-30 NOTE — ED Triage Notes (Signed)
Pt arrived POV from home c/o centralized chest pressure that started this morning around 3am. Pt states the pain radiates to her back, she also endorses SHOB.  ?

## 2021-12-30 NOTE — Discharge Instructions (Signed)
It was a pleasure taking care of you here in the emergency department. ? ?Your work-up today was reassuring aside from a minimally elevated BNP level.  Call your cardiologist tomorrow to let them know you are seen here today to see if your studies can be moved up sooner ? ?Return for any new or worsening symptoms ?

## 2021-12-30 NOTE — ED Provider Notes (Signed)
?[facility] ?Provider Note ? ? ?CSN: [id number] ?Arrival date & time: 12/30/21  1505 ? ?  ?History ? ?Chief Complaint  ?Patient presents with  ? Chest Pain  ? ? ?Emily [name] is a 81 y.o. female here for evaluation of pleuritic chest pain that began this morning.  Unfortunately patient has been dealing with exertion and shortness of breath for almost 2 months.  She has been seen multiple times by her PCP, cardiology as well as pulmonology.  Cardiology initially did not think symptoms were cardiac related however did schedule a CTA coronary scan and of the month.  She was also seen by pulmonology who thought maybe her symptoms were due to possibly asbestos exposure given history.  Her shortness of breath has not worsened however has not improved.  She was recently seen by her PCP after completing a steroid pack and stated her symptoms had mildly improved she was just placed on antibiotics to see if that helped as well.  Chest pain today described as a dull aching to the center of her chest worse with deep breathing.  Does not radiate to the left arm, jaw.  No hemoptysis.  Feels like she needs to cough however cannot cough anything up.  Symptoms are nonexertional in nature.  No history of PE or DVT.  No recent surgery, immobilization or malignancy.  Her chest hurts if she presses on it. Compliant with home meds. No fever, HA, abd pain, numbness, or weakness. Sleeps with a chronically elevated head of the bed. No LE swelling. ? ?HPI ? ?  ? ?Home Medications ?Prior to Admission medications   ?Medication Sig Start Date End Date Taking? Authorizing Provider  ?albuterol (VENTOLIN HFA) 108 (90 Base) MCG/ACT inhaler Inhale 2 puffs into the lungs every 6 (six) hours as needed for wheezing or shortness of breath. ?Patient not taking: Reported on 12/27/2021 11/16/21   Binnie Rail, MD  ?amoxicillin-clavulanate (AUGMENTIN) 875-125 MG tablet Take 1 tablet by mouth 2 (two) times daily.  12/27/21   Binnie Rail, MD  ?atenolol (TENORMIN) 25 MG tablet TAKE 1 TABLET BY MOUTH EVERY DAY 08/27/21   Binnie Rail, MD  ?benzonatate (TESSALON) 200 MG capsule Take 1 capsule (200 mg total) by mouth 3 (three) times daily as needed for cough. 12/14/21   Tanda Rockers, MD  ?Calcium-Vitamin D-Vitamin K 388-875-79 MG-UNT-MCG TABS Take 1 tablet by mouth 2 (two) times daily.    [provider]  ?chlorpheniramine (CHLOR-TRIMETON) 4 MG tablet Take 4 mg by mouth 2 (two) times daily as needed for allergies.    [provider]  ?cholecalciferol (VITAMIN D) 25 MCG (1000 UNIT) tablet Take 1,000 Units by mouth daily.    [provider]  ?[employer] ALLERGY RELIEF-D 5-120 MG tablet Take 1 tablet by mouth 2 (two) times daily. 08/26/21   [provider]  ?EPINEPHrine 0.3 mg/0.3 mL IJ SOAJ injection Inject 0.3 mg into the muscle as needed for anaphylaxis. 05/21/21   Horton, Barbette Hair, MD  ?ipratropium (ATROVENT) 0.03 % nasal spray SPRAY 2 SPRAYS IN EACH NOSTRIL EVERY 12 HOURS 12/28/21   Binnie Rail, MD  ?levothyroxine (SYNTHROID) 75 MCG tablet Take 1 tablet daily 30 minutes before breakfast 5 days a week 11/28/21   Binnie Rail, MD  ?levothyroxine (SYNTHROID) 88 MCG tablet Take 1 tablet daily 30 minutes prior to breakfast twice weekly 11/28/21   Binnie Rail, MD  ?metoprolol tartrate (LOPRESSOR) 100 MG tablet Take 1  tablet 2 hours prior to scan 12/24/21   Lenna Sciara, NP  ?triamcinolone (KENALOG) 0.025 % ointment Apply 1 application topically 2 (two) times daily. Use as needed. 03/29/21   Nunzio Cobbs, MD  ?   ? ?Allergies    ?Molnupiravir, Allegra [fexofenadine], Gabapentin, Levaquin [levofloxacin], Nexium [esomeprazole magnesium], and Valerian root   ? ?Review of Systems   ?Review of Systems  ?Constitutional: Negative.   ?HENT: Negative.    ?Respiratory:  Positive for shortness of breath (chronic).   ?Cardiovascular:  Positive for chest pain. Negative for palpitations and leg  swelling.  ?Gastrointestinal: Negative.   ?Genitourinary: Negative.   ?Musculoskeletal: Negative.   ?Skin: Negative.   ?Neurological: Negative.   ?All other systems reviewed and are negative. ? ?Physical Exam ?Updated Vital Signs ?BP (!) 152/98   Pulse 78   Temp 98.5 ?F (36.9 ?C) (Oral)   Resp 20   Ht '5\' 1"'$  (1.549 m)   Wt 54.4 kg   SpO2 99%   BMI 22.67 kg/m?  ?Physical Exam ?Vitals and nursing note reviewed.  ?Constitutional:   ?   General: She is not in acute distress. ?   Appearance: She is well-developed. She is not ill-appearing, toxic-appearing or diaphoretic.  ?HENT:  ?   Head: Normocephalic and atraumatic.  ?Eyes:  ?   Pupils: Pupils are equal, round, and reactive to light.  ?Cardiovascular:  ?   Rate and Rhythm: Normal rate.  ?   Pulses:     ?     Radial pulses are 2+ on the right side and 2+ on the left side.  ?     Dorsalis pedis pulses are 2+ on the right side and 2+ on the left side.  ?   Heart sounds: Normal heart sounds.  ?Pulmonary:  ?   Effort: Pulmonary effort is normal. No respiratory distress.  ?   Breath sounds: Normal breath sounds.  ?   Comments: Clear bilaterally, speaks in full sentences without difficulty ?Chest:  ?   Chest wall: Tenderness present. No mass, deformity, crepitus or edema. There is no dullness to percussion.  ?   Comments: Diffuse tenderness anterior chest wall.  No overlying erythema or warmth.  Patient with pectus excavatum ?Abdominal:  ?   General: Bowel sounds are normal. There is no distension.  ?   Palpations: Abdomen is soft.  ?   Comments: Soft, nontender  ?Musculoskeletal:     ?   General: Normal range of motion.  ?   Cervical back: Normal range of motion.  ?   Right lower leg: No tenderness. No edema.  ?   Left lower leg: No tenderness. No edema.  ?   Comments: No bony tenderness, full range of motion, compartment soft, Homans' sign negative bilaterally  ?Skin: ?   General: Skin is warm and dry.  ?   Capillary Refill: Capillary refill takes less than 2  seconds.  ?   Comments: No edema, erythema or warmth.  No fluctuance induration.  ?Neurological:  ?   General: No focal deficit present.  ?   Mental Status: She is alert.  ?Psychiatric:     ?   Mood and Affect: Mood normal.  ? ?ED Results / Procedures / Treatments   ?Labs ?(all labs ordered are listed, but only abnormal results are displayed) ?Labs Reviewed  ?BASIC METABOLIC PANEL - Abnormal; Notable for the following components:  ?    Result Value  ? Glucose, Bld 120 (*)   ?  Creatinine, Ser 1.13 (*)   ? GFR, Estimated 49 (*)   ? All other components within normal limits  ?D-DIMER, QUANTITATIVE - Abnormal; Notable for the following components:  ? D-Dimer, Quant 0.81 (*)   ? All other components within normal limits  ?BRAIN NATRIURETIC PEPTIDE - Abnormal; Notable for the following components:  ? B Natriuretic Peptide 128.2 (*)   ? All other components within normal limits  ?CBC  ?TROPONIN I (HIGH SENSITIVITY)  ?TROPONIN I (HIGH SENSITIVITY)  ? ? ?EKG ?None ? ?Radiology ?DG Chest 2 View ? ?Result Date: 12/30/2021 ?CLINICAL DATA:  Chest pain. EXAM: CHEST - 2 VIEW COMPARISON:  December 04, 2021 FINDINGS: The heart size and mediastinal contours are within normal limits. There is no evidence of acute infiltrate, pleural effusion or pneumothorax. Radiopaque surgical clips are seen along the lateral aspect of the mid to upper left chest wall. Surgical clips are also seen within the right upper quadrant. The visualized skeletal structures are unremarkable. IMPRESSION: No active cardiopulmonary disease. Electronically Signed   By: Virgina Norfolk M.D.   On: 12/30/2021 19:28  ? ?CT Angio Chest PE W/Cm &/Or Wo Cm ? ?Result Date: 12/30/2021 ?CLINICAL DATA:  Chest pain. EXAM: CT ANGIOGRAPHY CHEST WITH CONTRAST TECHNIQUE: Multidetector CT imaging of the chest was performed using the standard protocol during bolus administration of intravenous contrast. Multiplanar CT image reconstructions and MIPs were obtained to evaluate the  vascular anatomy. RADIATION DOSE REDUCTION: This exam was performed according to the departmental dose-optimization program which includes automated exposure control, adjustment of the mA and/or kV according to patient size and

## 2021-12-30 NOTE — ED Notes (Signed)
Patient transported to X-ray 

## 2021-12-30 NOTE — ED Notes (Signed)
Patient transported to CT 

## 2021-12-30 NOTE — ED Notes (Signed)
Patient verbalizes understanding of discharge instructions. Opportunity for questioning and answers were provided. Armband removed by staff, pt discharged from ED.  

## 2021-12-31 ENCOUNTER — Telehealth: Payer: Self-pay

## 2021-12-31 ENCOUNTER — Telehealth: Payer: Self-pay | Admitting: Cardiology

## 2021-12-31 ENCOUNTER — Telehealth (HOSPITAL_COMMUNITY): Payer: Self-pay | Admitting: *Deleted

## 2021-12-31 NOTE — Telephone Encounter (Signed)
Patient called stating she was in the ED yesterday. They advised her to get her CT scan moved up she states she left them a message to get it moved up but no one has called her.  ?They did some test her last night, and some test were high, she would like for them to be reviewed.   ?

## 2021-12-31 NOTE — Telephone Encounter (Signed)
[place]- Can you please assist with rescheduling patient ? ?Dr. Donny Pique- please advise for recent testing  ?

## 2021-12-31 NOTE — Telephone Encounter (Signed)
Spoke with patient today and recommendations given. ?

## 2021-12-31 NOTE — Telephone Encounter (Signed)
Reaching out to patient to offer assistance regarding upcoming cardiac imaging study; pt verbalizes understanding of appt date/time, parking situation and where to check in, pre-test NPO status and medications ordered, and verified current allergies; name and call back number provided for further questions should they arise  Ytzel Gubler RN Navigator Cardiac Imaging [facility] [phone] office [phone] cell  Patient to take 100mg metoprolol tartrate two hours prior to her cardiac CT scan. She is aware to arrive at 9am. 

## 2021-12-31 NOTE — Telephone Encounter (Signed)
Pt is calling to see if she should stop taking the Abx. Pt went to the ER last night and they gave her  ketorolac (TORADOL) 30 MG/ML injection 15 mg  and it improved her sxs.  ? ?Pt is scheduled to have CT of the heart with contrast and just wants to know if she needs to continue amoxicillin-clavulanate (AUGMENTIN) 875-125 MG tablet  ? ?Please advise ?

## 2021-12-31 NOTE — Telephone Encounter (Signed)
I would complete the antibiotic since that was more for the mucus, cough and possible SOB - she has already taken it for a few days so I think it would be best to complete the whole course  ?

## 2022-01-01 ENCOUNTER — Encounter (HOSPITAL_COMMUNITY): Payer: Self-pay

## 2022-01-01 ENCOUNTER — Ambulatory Visit (HOSPITAL_COMMUNITY)
Admission: RE | Admit: 2022-01-01 | Discharge: 2022-01-01 | Disposition: A | Discharge status: Home / Self Care | Payer: Medicare HMO | Source: Ambulatory Visit | Attending: Nurse Practitioner | Admitting: Nurse Practitioner

## 2022-01-01 ENCOUNTER — Ambulatory Visit (HOSPITAL_BASED_OUTPATIENT_CLINIC_OR_DEPARTMENT_OTHER): Payer: Medicare HMO

## 2022-01-01 ENCOUNTER — Telehealth: Payer: Self-pay | Admitting: Cardiology

## 2022-01-01 VITALS — BP 119/63

## 2022-01-01 DIAGNOSIS — N1831 Chronic kidney disease, stage 3a: Secondary | ICD-10-CM | POA: Diagnosis present

## 2022-01-01 DIAGNOSIS — R0602 Shortness of breath: Secondary | ICD-10-CM | POA: Diagnosis present

## 2022-01-01 DIAGNOSIS — I351 Nonrheumatic aortic (valve) insufficiency: Secondary | ICD-10-CM | POA: Insufficient documentation

## 2022-01-01 DIAGNOSIS — I10 Essential (primary) hypertension: Secondary | ICD-10-CM | POA: Diagnosis present

## 2022-01-01 DIAGNOSIS — R002 Palpitations: Secondary | ICD-10-CM | POA: Insufficient documentation

## 2022-01-01 LAB — POCT I-STAT CREATININE: Creatinine, Ser: 1 mg/dL (ref 0.44–1.00)

## 2022-01-01 MED ORDER — NITROGLYCERIN 0.4 MG SL SUBL
0.8000 mg | SUBLINGUAL_TABLET | Freq: Once | SUBLINGUAL | Status: AC
  Administered 2022-01-01: 0.8 mg via SUBLINGUAL

## 2022-01-01 MED ORDER — NITROGLYCERIN 0.4 MG SL SUBL
SUBLINGUAL_TABLET | SUBLINGUAL | Status: AC
  Filled 2022-01-01: qty 2

## 2022-01-01 MED ORDER — IOHEXOL 350 MG/ML SOLN
100.0000 mL | Freq: Once | INTRAVENOUS | Status: AC | PRN
  Administered 2022-01-01: 100 mL via INTRAVENOUS

## 2022-01-01 NOTE — Telephone Encounter (Signed)
RN attempted call to patient, no answer left message (ok per DPR).  ? ? ? ?"Testing in the ER does not suggest a heart attack. Heart CT being done today. There was a high d-dimer, but CT of the lungs did not show clot. BNP very slightly elevated but this does not cause pain. Will see what CT heart shows."  ?

## 2022-01-01 NOTE — Telephone Encounter (Signed)
RN returned call to patient who states she does not recall asking for this information to be reviewed. Shared Dr. Judeth Cornfield recommendation with the patient and told her we would be in touch when the results of her CT are ready.  ? ? ? ? ?"Testing in the ER does not suggest a heart attack. Heart CT being done today. There was a high d-dimer, but CT of the lungs did not show clot. BNP very slightly elevated but this does not cause pain. Will see what CT heart shows."  ?

## 2022-01-01 NOTE — Telephone Encounter (Signed)
Patient is returning RN's call. Please advise. 

## 2022-01-01 NOTE — Telephone Encounter (Signed)
Testing in the ER does not suggest a heart attack. Heart CT being done today. There was a high d-dimer, but CT of the lungs did not show clot. BNP very slightly elevated but this does not cause pain. Will see what CT heart shows.

## 2022-01-02 ENCOUNTER — Telehealth: Payer: Self-pay

## 2022-01-02 ENCOUNTER — Ambulatory Visit (HOSPITAL_COMMUNITY)
Admission: RE | Admit: 2022-01-02 | Discharge: 2022-01-02 | Disposition: A | Discharge status: Home / Self Care | Payer: Medicare HMO | Source: Ambulatory Visit | Attending: Nurse Practitioner | Admitting: Nurse Practitioner

## 2022-01-02 DIAGNOSIS — R0602 Shortness of breath: Secondary | ICD-10-CM

## 2022-01-02 DIAGNOSIS — R002 Palpitations: Secondary | ICD-10-CM | POA: Diagnosis not present

## 2022-01-02 DIAGNOSIS — I10 Essential (primary) hypertension: Secondary | ICD-10-CM | POA: Diagnosis not present

## 2022-01-02 DIAGNOSIS — R0600 Dyspnea, unspecified: Secondary | ICD-10-CM | POA: Insufficient documentation

## 2022-01-02 DIAGNOSIS — I351 Nonrheumatic aortic (valve) insufficiency: Secondary | ICD-10-CM

## 2022-01-02 LAB — ECHOCARDIOGRAM COMPLETE
Area-P 1/2: 6.02 cm2
S' Lateral: 2.1 cm
Single Plane A4C EF: 64.2 %

## 2022-01-02 NOTE — Progress Notes (Signed)
Spoke wit pt. Pt was notified of lab and CT results. Pt completed Echo this morning and we will call her with results when available.  ?

## 2022-01-02 NOTE — Telephone Encounter (Signed)
Spoke wit pt. Pt was notified of lab and CT results. Pt completed Echo this morning and we will call her with results when available.  ?

## 2022-01-04 ENCOUNTER — Other Ambulatory Visit (HOSPITAL_COMMUNITY): Payer: Medicare HMO

## 2022-01-04 NOTE — Telephone Encounter (Signed)
Spoke with pt. Pt was notified of echocardiogram results. Pt will continue current medications and follow up as planned.

## 2022-01-09 ENCOUNTER — Other Ambulatory Visit (INDEPENDENT_AMBULATORY_CARE_PROVIDER_SITE_OTHER): Payer: Medicare HMO

## 2022-01-09 DIAGNOSIS — E038 Other specified hypothyroidism: Secondary | ICD-10-CM | POA: Diagnosis not present

## 2022-01-09 LAB — TSH: TSH: 0.5 u[IU]/mL (ref 0.35–5.50)

## 2022-01-11 ENCOUNTER — Ambulatory Visit (HOSPITAL_COMMUNITY): Payer: Medicare HMO

## 2022-01-22 ENCOUNTER — Ambulatory Visit (INDEPENDENT_AMBULATORY_CARE_PROVIDER_SITE_OTHER): Payer: Medicare HMO

## 2022-01-22 DIAGNOSIS — Z0000 Encounter for general adult medical examination without abnormal findings: Secondary | ICD-10-CM | POA: Diagnosis not present

## 2022-01-22 NOTE — Progress Notes (Signed)
I connected with Emily [name] today by telephone and verified that I am speaking with the correct person using two identifiers. Location patient: home Location provider: work Persons participating in the virtual visit: patient, provider.   I discussed the limitations, risks, security and privacy concerns of performing an evaluation and management service by telephone and the availability of in person appointments. I also discussed with the patient that there may be a patient responsible charge related to this service. The patient expressed understanding and verbally consented to this telephonic visit.    Interactive audio and video telecommunications were attempted between this provider and patient, however failed, due to patient having technical difficulties OR patient did not have access to video capability.  We continued and completed visit with audio only.  Some vital signs may be absent or patient reported.   Time Spent with patient on telephone encounter: 30 minutes  Subjective:   Emily [name] is a 81 y.o. female who presents for Medicare Annual (Subsequent) preventive examination.  Review of Systems     Cardiac Risk Factors include: advanced age (>31mn, >>21women);dyslipidemia;family history of premature cardiovascular disease     Objective:    There were no vitals filed for this visit. There is no height or weight on file to calculate BMI.     01/22/2022    9:35 AM 12/04/2021    8:50 AM 09/26/2021    9:33 AM 05/23/2021    2:20 PM 05/20/2021   11:20 PM 05/18/2021   10:42 AM 11/29/2020    9:31 AM  Advanced Directives  Does Patient Have a Medical Advance Directive? Yes No Yes Yes Yes Yes Yes  Type of Advance Directive Living will;Healthcare Power of [place] Living will [place] Living will;Healthcare Power of Attorney  Does patient want to make changes to  medical advance directive? No - Patient declined  No - Patient declined    No - Patient declined  Copy of [place] Chart? No - copy requested  Yes - validated most recent copy scanned in chart (See row information)   No - copy requested No - copy requested  Would patient like information on creating a medical advance directive?  No - Patient declined         Current Medications (verified) Outpatient Encounter Medications as of 01/22/2022  Medication Sig   albuterol (VENTOLIN HFA) 108 (90 Base) MCG/ACT inhaler Inhale 2 puffs into the lungs every 6 (six) hours as needed for wheezing or shortness of breath. (Patient not taking: Reported on 12/27/2021)   amoxicillin-clavulanate (AUGMENTIN) 875-125 MG tablet Take 1 tablet by mouth 2 (two) times daily.   atenolol (TENORMIN) 25 MG tablet TAKE 1 TABLET BY MOUTH EVERY DAY   benzonatate (TESSALON) 200 MG capsule Take 1 capsule (200 mg total) by mouth 3 (three) times daily as needed for cough.   Calcium-Vitamin D-Vitamin K 7812-751-70MG-UNT-MCG TABS Take 1 tablet by mouth 2 (two) times daily.   chlorpheniramine (CHLOR-TRIMETON) 4 MG tablet Take 4 mg by mouth 2 (two) times daily as needed for allergies.   cholecalciferol (VITAMIN D) 25 MCG (1000 UNIT) tablet Take 1,000 Units by mouth daily.   [employer] ALLERGY RELIEF-D 5-120 MG tablet Take 1 tablet by mouth 2 (two) times daily.   EPINEPHrine 0.3 mg/0.3 mL IJ SOAJ injection Inject 0.3 mg into the muscle as needed for anaphylaxis.   ipratropium (ATROVENT) 0.03 % nasal  spray SPRAY 2 SPRAYS IN EACH NOSTRIL EVERY 12 HOURS   levothyroxine (SYNTHROID) 75 MCG tablet Take 1 tablet daily 30 minutes before breakfast 5 days a week   levothyroxine (SYNTHROID) 88 MCG tablet Take 1 tablet daily 30 minutes prior to breakfast twice weekly   metoprolol tartrate (LOPRESSOR) 100 MG tablet Take 1 tablet 2 hours prior to scan   triamcinolone (KENALOG) 0.025 % ointment Apply 1 application topically 2 (two) times  daily. Use as needed.   No facility-administered encounter medications on file as of 01/22/2022.    Allergies (verified) Molnupiravir, Allegra [fexofenadine], Gabapentin, Levaquin [levofloxacin], Nexium [esomeprazole magnesium], and Valerian root   History: Past Medical History:  Diagnosis Date   Actinic keratosis    Allergic rhinitis    BACK PAIN    CARCINOMA, BREAST, 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   L s/p lumpectomy, chemo and XRT   Cervicalgia    Cholelithiasis    DIVERTICULOSIS, COLON    ENDOMETRIOSIS    GERD (gastroesophageal reflux disease)    Heart palpitations    Hypertension    HYPOTHYROIDISM    Left wrist fracture 11/06/2016   10/2016 - fell of high stool   Personal history of chemotherapy    Personal history of radiation therapy    Pleural plaque without asbestos    CT chest 10/2010: R>L lobes, upper> lower   Squamous cell carcinoma    History of BC   Past Surgical History:  Procedure Laterality Date   BREAST LUMPECTOMY  2001   left   CATARACT EXTRACTION Bilateral 09/2014   both eyes   CHOLECYSTECTOMY  2010   ENDOMETRIAL ABLATION     EXCISION / BIOPSY BREAST / NIPPLE / DUCT Left 2001   Family History  Problem Relation Age of Onset   Dementia Mother    Heart disease Father    Emphysema Father        smoked and worked with asbestos   Colon cancer Neg Hx    Esophageal cancer Neg Hx    Rectal cancer Neg Hx    Social History   Socioeconomic History   Marital status: Married    Spouse name: Not on file   Number of children: 2   Years of education: Not on file   Highest education level: Not on file  Occupational History   Occupation: Retired    Comment: Chiropractor  Tobacco Use   Smoking status: Never   Smokeless tobacco: Never  Vaping Use   Vaping Use: Never used  Substance and Sexual Activity   Alcohol use: No    Comment: 1 drink every 2 months   Drug use: No   Sexual activity: Not Currently    Birth control/protection: Post-menopausal   Other Topics Concern   Not on file  Social History Narrative   Married, lives in [place] area since 2008 from [place] to be close to dtr   Social Determinants of Radio broadcast assistant Strain: Low Risk    Difficulty of Paying Living Expenses: Not hard at all  Food Insecurity: No Food Insecurity   Worried About Charity fundraiser in the Last Year: Never true   Arboriculturist in the Last Year: Never true  Transportation Needs: No Transportation Needs   Lack of Transportation (Medical): No   Lack of Transportation (Non-Medical): No  Physical Activity: Sufficiently Active   Days of Exercise per Week: 5 days   Minutes of Exercise per Session: 30 min  Stress: No Stress  Concern Present   Feeling of Stress : Not at all  Social Connections: Socially Integrated   Frequency of Communication with Friends and Family: More than three times a week   Frequency of Social Gatherings with Friends and Family: More than three times a week   Attends Religious Services: More than 4 times per year   Active Member of [employer] or Organizations: Yes   Attends Music therapist: More than 4 times per year   Marital Status: Married    Tobacco Counseling Counseling given: Not Answered   Clinical Intake:  Pre-visit preparation completed: Yes  Pain : No/denies pain     BMI - recorded: 22.69 Nutritional Risks: None Diabetes: No  How often do you need to have someone help you when you read instructions, pamphlets, or other written materials from your doctor or pharmacy?: 1 - Never  Diabetic? no  Interpreter Needed?: No  Comments: HSG Information entered by :: Lisette Abu, LPN   Activities of Daily Living    01/22/2022    9:40 AM  In your present state of health, do you have any difficulty performing the following activities:  Hearing? 1  Vision? 0  Difficulty concentrating or making decisions? 0  Walking or climbing stairs? 0  Dressing or bathing? 0  Doing errands,  shopping? 0  Preparing Food and eating ? N  Using the Toilet? N  In the past six months, have you accidently leaked urine? N  Do you have problems with loss of bowel control? N  Managing your Medications? N  Managing your Finances? N  Housekeeping or managing your Housekeeping? N    Patient Care Team: Binnie Rail, MD as PCP - General (Internal Medicine) Buford Dresser, MD as PCP - Cardiology (Cardiology) Servando Salina, MD as Consulting Physician (Obstetrics and Gynecology) Jackolyn Confer, MD as Consulting Physician (General Surgery) Tanda Rockers, MD as Consulting Physician (Pulmonary Disease) Ladell Pier, MD as Consulting Physician (Internal Medicine) Marchia Bond, MD (Orthopedic Surgery) Calvert Cantor, MD (Ophthalmology)  Indicate any recent Medical Services you may have received from other than [facility] providers in the past year (date may be approximate).     Assessment:   This is a routine wellness examination for Emily [name].  Hearing/Vision screen Hearing Screening - Comments:: Patient has hearing difficulty and wears hearing aids. Don't wear them often. Vision Screening - Comments:: Patient only wears rx reading glasses. Eye exam done by: Calvert Cantor, MD.  Dietary issues and exercise activities discussed: Current Exercise Habits: Home exercise routine, Type of exercise: walking, Time (Minutes): 30, Frequency (Times/Week): 5, Weekly Exercise (Minutes/Week): 150, Intensity: Moderate, Exercise limited by: respiratory conditions(s);orthopedic condition(s)   Goals Addressed             This Visit's Progress    My goal is to maintain my health by watching my diet, staying active and independent.        Depression Screen    01/22/2022    9:40 AM 10/01/2021    1:28 PM 11/29/2020    9:30 AM 09/25/2020    9:51 AM 09/23/2019    8:26 AM 09/16/2018    8:12 AM 09/12/2017    9:14 AM  PHQ 2/9 Scores  PHQ - 2 Score 0 0 0 0 0 0 0  PHQ- 9 Score    2       Fall  Risk    01/22/2022    9:37 AM 10/01/2021    1:28 PM 11/29/2020    9:31  AM 11/17/2019    1:47 PM 09/23/2019    8:25 AM  Fall Risk   Falls in the past year? 0 0 0 1 1  Number falls in past yr: 0 0 0 1 0  Injury with Fall? 0 0 0 1 0  Risk for fall due to : No Fall Risks No Fall Risks No Fall Risks    Follow up Falls evaluation completed Falls evaluation completed Falls evaluation completed      Julian:  Any stairs in or around the home? Yes  If so, are there any without handrails? No  Home free of loose throw rugs in walkways, pet beds, electrical cords, etc? Yes  Adequate lighting in your home to reduce risk of falls? Yes   ASSISTIVE DEVICES UTILIZED TO PREVENT FALLS:  Life alert? No  Use of a cane, walker or w/c? No  Grab bars in the bathroom? No  Shower chair or bench in shower? Yes  Elevated toilet seat or a handicapped toilet? Yes   TIMED UP AND GO:  Was the test performed? No .  Length of time to ambulate 10 feet: n/a sec.   Appearance of gait: Patient not evaluated for gait during this visit.  Cognitive Function:        01/22/2022    9:48 AM  6CIT Screen  What Year? 0 points  What month? 0 points  What time? 0 points  Count back from 20 0 points  Months in reverse 0 points  Repeat phrase 0 points  Total Score 0 points    Immunizations Immunization History  Administered Date(s) Administered   Fluad Quad(high Dose 65+) 06/09/2019   Influenza Split 05/20/2011, 06/10/2012   Influenza Whole 06/18/2007   Influenza, High Dose Seasonal PF 06/07/2013, 04/20/2015, 07/25/2016, 06/19/2017, 07/10/2018, 06/22/2020   Influenza,inj,Quad PF,6+ Mos 05/31/2014   Influenza-Unspecified 06/08/2021   PFIZER(Purple Top)SARS-COV-2 Vaccination 09/08/2019, 09/29/2019, 05/12/2020   Pneumococcal Conjugate-13 07/28/2014   Pneumococcal Polysaccharide-23 06/18/2007   Td 06/18/2007   Tdap 07/06/2017   Zoster Recombinat (Shingrix) 03/24/2020,  09/22/2020   Zoster, Live 11/12/2011    TDAP status: Up to date  Flu Vaccine status: Up to date  Pneumococcal vaccine status: Up to date  Covid-19 vaccine status: Completed vaccines  Qualifies for Shingles Vaccine? Yes   Zostavax completed Yes   Shingrix Completed?: Yes  Screening Tests Health Maintenance  Topic Date Due   COVID-19 Vaccine (4 - Booster for Pfizer series) 07/07/2020   DEXA SCAN  09/27/2021   INFLUENZA VACCINE  03/19/2022   TETANUS/TDAP  07/07/2027   Pneumonia Vaccine 54+ Years old  Completed   Hepatitis C Screening  Completed   Zoster Vaccines- Shingrix  Completed   HPV VACCINES  Aged Out    Health Maintenance  Health Maintenance Due  Topic Date Due   COVID-19 Vaccine (4 - Booster for [place] series) 07/07/2020   DEXA SCAN  09/27/2021    Colorectal cancer screening: Type of screening: Cologuard. Completed 10/29/2021. Repeat every 3 years  Mammogram status: Completed 02/09/2021. Repeat every year (scheduled for 02/12/2022)  Bone Density status: Completed 09/28/2019. Results reflect: Bone density results: OSTEOPOROSIS. Repeat every 2 years. (Scheduled for 03/15/2022)  Lung Cancer Screening: (Low Dose CT Chest recommended if Age 59-80 years, 30 pack-year currently smoking OR have quit w/in 15years.) does not qualify.   Lung Cancer Screening Referral: no  Additional Screening:  Hepatitis C Screening: does qualify; Completed 09/25/2020  Vision Screening: Recommended annual ophthalmology exams  for early detection of glaucoma and other disorders of the eye. Is the patient up to date with their annual eye exam?  Yes  Who is the provider or what is the name of the office in which the patient attends annual eye exams? Calvert Cantor, MD. If pt is not established with a provider, would they like to be referred to a provider to establish care? No .   Dental Screening: Recommended annual dental exams for proper oral hygiene  Community Resource Referral / Chronic  Care Management: CRR required this visit?  No   CCM required this visit?  No      Plan:     I have personally reviewed and noted the following in the patient's chart:   Medical and social history Use of alcohol, tobacco or illicit drugs  Current medications and supplements including opioid prescriptions.  Functional ability and status Nutritional status Physical activity Advanced directives List of other physicians Hospitalizations, surgeries, and ER visits in previous 12 months Vitals Screenings to include cognitive, depression, and falls Referrals and appointments  In addition, I have reviewed and discussed with patient certain preventive protocols, quality metrics, and best practice recommendations. A written personalized care plan for preventive services as well as general preventive health recommendations were provided to patient.     Sheral Flow, LPN   02/17/2196   Nurse Notes:  There were no vitals filed for this visit. There is no height or weight on file to calculate BMI. Patient stated that she has no issues with gait or balance; does not use any assistive devices. Medications reviewed with patient; no opioid use noted.

## 2022-01-22 NOTE — Patient Instructions (Signed)
Emily [name] , Thank you for taking time to come for your Medicare Wellness Visit. I appreciate your ongoing commitment to your health goals. Please review the following plan we discussed and let me know if I can assist you in the future.   Screening recommendations/referrals: Cologuard: 10/29/2021; due every 3 years Mammogram: 02/09/2021; scheduled for 02/12/2022; due every year Bone Density: 09/28/2019; scheduled for 03/15/2022; due every 2 years Recommended yearly ophthalmology/optometry visit for glaucoma screening and checkup Recommended yearly dental visit for hygiene and checkup  Vaccinations: Influenza vaccine: 09/13/2021 Pneumococcal vaccine: 06/18/2007, 07/28/2014 Tdap vaccine: 07/06/2017; due every 10 years Shingles vaccine: 03/24/2020, 09/22/2020 Zoster vaccine: 11/12/2011   Covid-19: 09/08/2019, 09/29/2019, 05/12/2020  Advanced directives: Yes; Please bring a copy of your health care power of attorney and living will to the office at your convenience.  Conditions/risks identified: Yes  Next appointment: Please schedule your next Medicare Wellness Visit with your Nurse Health Advisor in 1 year by calling [phone].   Preventive Care 80 Years and Older, Female Preventive care refers to lifestyle choices and visits with your health care provider that can promote health and wellness. What does preventive care include? A yearly physical exam. This is also called an annual well check. Dental exams once or twice a year. Routine eye exams. Ask your health care provider how often you should have your eyes checked. Personal lifestyle choices, including: Daily care of your teeth and gums. Regular physical activity. Eating a healthy diet. Avoiding tobacco and drug use. Limiting alcohol use. Practicing safe sex. Taking low-dose aspirin every day. Taking vitamin and mineral supplements as recommended by your health care provider. What happens during an annual well check? The services and  screenings done by your health care provider during your annual well check will depend on your age, overall health, lifestyle risk factors, and family history of disease. Counseling  Your health care provider may ask you questions about your: Alcohol use. Tobacco use. Drug use. Emotional well-being. Home and relationship well-being. Sexual activity. Eating habits. History of falls. Memory and ability to understand (cognition). Work and work Statistician. Reproductive health. Screening  You may have the following tests or measurements: Height, weight, and BMI. Blood pressure. Lipid and cholesterol levels. These may be checked every 5 years, or more frequently if you are over 15 years old. Skin check. Lung cancer screening. You may have this screening every year starting at age 80 if you have a 30-pack-year history of smoking and currently smoke or have quit within the past 15 years. Fecal occult blood test (FOBT) of the stool. You may have this test every year starting at age 80. Flexible sigmoidoscopy or colonoscopy. You may have a sigmoidoscopy every 5 years or a colonoscopy every 10 years starting at age 80. Hepatitis C blood test. Hepatitis B blood test. Sexually transmitted disease (STD) testing. Diabetes screening. This is done by checking your blood sugar (glucose) after you have not eaten for a while (fasting). You may have this done every 1-3 years. Bone density scan. This is done to screen for osteoporosis. You may have this done starting at age 80. Mammogram. This may be done every 1-2 years. Talk to your health care provider about how often you should have regular mammograms. Talk with your health care provider about your test results, treatment options, and if necessary, the need for more tests. Vaccines  Your health care provider may recommend certain vaccines, such as: Influenza vaccine. This is recommended every year. Tetanus, diphtheria, and acellular pertussis (  Tdap,  Td) vaccine. You may need a Td booster every 10 years. Zoster vaccine. You may need this after age 80. Pneumococcal 13-valent conjugate (PCV13) vaccine. One dose is recommended after age 80. Pneumococcal polysaccharide (PPSV23) vaccine. One dose is recommended after age 80. Talk to your health care provider about which screenings and vaccines you need and how often you need them. This information is not intended to replace advice given to you by your health care provider. Make sure you discuss any questions you have with your health care provider. Document Released: 09/01/2015 Document Revised: 04/24/2016 Document Reviewed: 06/06/2015 Elsevier Interactive Patient Education  2017 [place] Prevention in the Home Falls can cause injuries. They can happen to people of all ages. There are many things you can do to make your home safe and to help prevent falls. What can I do on the outside of my home? Regularly fix the edges of walkways and driveways and fix any cracks. Remove anything that might make you trip as you walk through a door, such as a raised step or threshold. Trim any bushes or trees on the path to your home. Use bright outdoor lighting. Clear any walking paths of anything that might make someone trip, such as rocks or tools. Regularly check to see if handrails are loose or broken. Make sure that both sides of any steps have handrails. Any raised decks and porches should have guardrails on the edges. Have any leaves, snow, or ice cleared regularly. Use sand or salt on walking paths during winter. Clean up any spills in your garage right away. This includes oil or grease spills. What can I do in the bathroom? Use night lights. Install grab bars by the toilet and in the tub and shower. Do not use towel bars as grab bars. Use non-skid mats or decals in the tub or shower. If you need to sit down in the shower, use a plastic, non-slip stool. Keep the floor dry. Clean up any  water that spills on the floor as soon as it happens. Remove soap buildup in the tub or shower regularly. Attach bath mats securely with double-sided non-slip rug tape. Do not have throw rugs and other things on the floor that can make you trip. What can I do in the bedroom? Use night lights. Make sure that you have a light by your bed that is easy to reach. Do not use any sheets or blankets that are too big for your bed. They should not hang down onto the floor. Have a firm chair that has side arms. You can use this for support while you get dressed. Do not have throw rugs and other things on the floor that can make you trip. What can I do in the kitchen? Clean up any spills right away. Avoid walking on wet floors. Keep items that you use a lot in easy-to-reach places. If you need to reach something above you, use a strong step stool that has a grab bar. Keep electrical cords out of the way. Do not use floor polish or wax that makes floors slippery. If you must use wax, use non-skid floor wax. Do not have throw rugs and other things on the floor that can make you trip. What can I do with my stairs? Do not leave any items on the stairs. Make sure that there are handrails on both sides of the stairs and use them. Fix handrails that are broken or loose. Make sure that handrails are  as long as the stairways. Check any carpeting to make sure that it is firmly attached to the stairs. Fix any carpet that is loose or worn. Avoid having throw rugs at the top or bottom of the stairs. If you do have throw rugs, attach them to the floor with carpet tape. Make sure that you have a light switch at the top of the stairs and the bottom of the stairs. If you do not have them, ask someone to add them for you. What else can I do to help prevent falls? Wear shoes that: Do not have high heels. Have rubber bottoms. Are comfortable and fit you well. Are closed at the toe. Do not wear sandals. If you use a  stepladder: Make sure that it is fully opened. Do not climb a closed stepladder. Make sure that both sides of the stepladder are locked into place. Ask someone to hold it for you, if possible. Clearly mark and make sure that you can see: Any grab bars or handrails. First and last steps. Where the edge of each step is. Use tools that help you move around (mobility aids) if they are needed. These include: Canes. Walkers. Scooters. Crutches. Turn on the lights when you go into a dark area. Replace any light bulbs as soon as they burn out. Set up your furniture so you have a clear path. Avoid moving your furniture around. If any of your floors are uneven, fix them. If there are any pets around you, be aware of where they are. Review your medicines with your doctor. Some medicines can make you feel dizzy. This can increase your chance of falling. Ask your doctor what other things that you can do to help prevent falls. This information is not intended to replace advice given to you by your health care provider. Make sure you discuss any questions you have with your health care provider. Document Released: 06/01/2009 Document Revised: 01/11/2016 Document Reviewed: 09/09/2014 Elsevier Interactive Patient Education  2017 [employer].

## 2022-01-31 ENCOUNTER — Other Ambulatory Visit: Payer: Self-pay | Admitting: Internal Medicine

## 2022-01-31 ENCOUNTER — Other Ambulatory Visit (INDEPENDENT_AMBULATORY_CARE_PROVIDER_SITE_OTHER): Payer: Medicare HMO

## 2022-01-31 DIAGNOSIS — E038 Other specified hypothyroidism: Secondary | ICD-10-CM | POA: Diagnosis not present

## 2022-01-31 DIAGNOSIS — H1045 Other chronic allergic conjunctivitis: Secondary | ICD-10-CM | POA: Diagnosis not present

## 2022-01-31 LAB — TSH: TSH: 0.07 u[IU]/mL — ABNORMAL LOW (ref 0.35–5.50)

## 2022-01-31 MED ORDER — LEVOTHYROXINE SODIUM 88 MCG PO TABS: ORAL_TABLET | ORAL | 3 refills | Status: DC

## 2022-01-31 MED ORDER — LEVOTHYROXINE SODIUM 75 MCG PO TABS: ORAL_TABLET | ORAL | 3 refills | Status: DC

## 2022-01-31 NOTE — Patient Instructions (Signed)
     Blood work was ordered.     Medications changes include :   none   Your prescription(s) have been sent to your pharmacy.    A referral was ordered for [facility] for a colonoscopy.     Someone from that office will call you to schedule an appointment.    Return in about 6 months (around 09/19/2022) for Physical Exam.   [facility] Phone: [phone]  

## 2022-02-07 DIAGNOSIS — D225 Melanocytic nevi of trunk: Secondary | ICD-10-CM | POA: Diagnosis not present

## 2022-02-07 DIAGNOSIS — D229 Melanocytic nevi, unspecified: Secondary | ICD-10-CM | POA: Diagnosis not present

## 2022-02-07 DIAGNOSIS — D485 Neoplasm of uncertain behavior of skin: Secondary | ICD-10-CM | POA: Diagnosis not present

## 2022-02-07 DIAGNOSIS — D2272 Melanocytic nevi of left lower limb, including hip: Secondary | ICD-10-CM | POA: Diagnosis not present

## 2022-02-07 DIAGNOSIS — L820 Inflamed seborrheic keratosis: Secondary | ICD-10-CM | POA: Diagnosis not present

## 2022-02-07 DIAGNOSIS — D2261 Melanocytic nevi of right upper limb, including shoulder: Secondary | ICD-10-CM | POA: Diagnosis not present

## 2022-02-07 DIAGNOSIS — L570 Actinic keratosis: Secondary | ICD-10-CM | POA: Diagnosis not present

## 2022-02-07 DIAGNOSIS — D2230 Melanocytic nevi of unspecified part of face: Secondary | ICD-10-CM | POA: Diagnosis not present

## 2022-02-12 ENCOUNTER — Ambulatory Visit
Admission: RE | Admit: 2022-02-12 | Discharge: 2022-02-12 | Disposition: A | Discharge status: Home / Self Care | Payer: Medicare HMO | Source: Ambulatory Visit | Attending: Internal Medicine | Admitting: Internal Medicine

## 2022-02-12 DIAGNOSIS — Z1231 Encounter for screening mammogram for malignant neoplasm of breast: Secondary | ICD-10-CM | POA: Diagnosis not present

## 2022-02-18 ENCOUNTER — Ambulatory Visit (HOSPITAL_BASED_OUTPATIENT_CLINIC_OR_DEPARTMENT_OTHER): Payer: Medicare HMO | Admitting: Cardiology

## 2022-02-28 DIAGNOSIS — H40013 Open angle with borderline findings, low risk, bilateral: Secondary | ICD-10-CM | POA: Diagnosis not present

## 2022-02-28 DIAGNOSIS — H524 Presbyopia: Secondary | ICD-10-CM | POA: Diagnosis not present

## 2022-02-28 DIAGNOSIS — H35371 Puckering of macula, right eye: Secondary | ICD-10-CM | POA: Diagnosis not present

## 2022-02-28 DIAGNOSIS — H1045 Other chronic allergic conjunctivitis: Secondary | ICD-10-CM | POA: Diagnosis not present

## 2022-03-08 ENCOUNTER — Ambulatory Visit (HOSPITAL_BASED_OUTPATIENT_CLINIC_OR_DEPARTMENT_OTHER): Payer: Medicare HMO | Admitting: Cardiology

## 2022-03-09 DIAGNOSIS — Z0100 Encounter for examination of eyes and vision without abnormal findings: Secondary | ICD-10-CM | POA: Diagnosis not present

## 2022-03-12 ENCOUNTER — Encounter (HOSPITAL_BASED_OUTPATIENT_CLINIC_OR_DEPARTMENT_OTHER): Payer: Self-pay | Admitting: Cardiology

## 2022-03-12 ENCOUNTER — Ambulatory Visit (HOSPITAL_BASED_OUTPATIENT_CLINIC_OR_DEPARTMENT_OTHER): Payer: Medicare HMO | Admitting: Cardiology

## 2022-03-12 VITALS — BP 147/87 | HR 77 | Ht 61.0 in | Wt 120.9 lb

## 2022-03-12 DIAGNOSIS — R011 Cardiac murmur, unspecified: Secondary | ICD-10-CM | POA: Diagnosis not present

## 2022-03-12 DIAGNOSIS — R0789 Other chest pain: Secondary | ICD-10-CM | POA: Diagnosis not present

## 2022-03-12 DIAGNOSIS — R0602 Shortness of breath: Secondary | ICD-10-CM | POA: Diagnosis not present

## 2022-03-12 DIAGNOSIS — R002 Palpitations: Secondary | ICD-10-CM | POA: Diagnosis not present

## 2022-03-12 NOTE — Progress Notes (Signed)
Cardiology Office Note:    Date:  03/12/2022   ID:  Emily [name], DOB September 13, 1940, MRN [id number]  PCP:  Binnie Rail, MD  Cardiologist:  Buford Dresser, MD PhD  Referring MD: Binnie Rail, MD   CC: follow up  History of Present Illness:    Emily [name] is a 81 y.o. female with a hx of palpitations, mitral valve prolapse who is seen for follow up. I initially met her 05/20/2018 as a new consult at the request of Burns, [name], MD for the evaluation and management of presyncope and palpitations.   At her last visit she presented as an urgent add-on visit after calling into the triage line and reporting 3 week history of shortness of breath. She became severely short of breath after climbing stairs, became lightheaded and developed a headache. She presented to the ER and found to have elevated blood pressure as well. She had similar symptoms of dyspnea in 2020 and was given a shot which resolved her shortness of breath. She believed the injection was cortisone.  She presented to the ED 12/30/21 with pleuritic chest pain with onset that morning. Her pain was reproducible and resolved with Toradol in the ED. EKG without acute abnormalities, negative troponin, and negative CXR. She had an echocardiogram 01/02/22 showing LVEF 60-65%.  Today: Overall she is feeling okay from a cardiovascular perspective. Since she received Toradol in the ER, she denies any recurring chest pain.  She is suffering from issues with her left leg/hip which she believes is attributable to arthritis. After sitting even for only 5 minutes she develops pain. She feels better while walking around.  In clinic today her blood pressure is elevated at 147/87 which she states is likely due to white coat hypertension. Sometimes she checks her BP at home, but not recently. When she did it was usually well controlled.  Lately her shortness of breath has improved. She is also taking allergy medication.   Her  palpitations have been well managed on atenolol.  She denies any peripheral edema. No lightheadedness, headaches, syncope, orthopnea, or PND.   Past Medical History:  Diagnosis Date   Actinic keratosis    Allergic rhinitis    BACK PAIN    CARCINOMA, BREAST, 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   L s/p lumpectomy, chemo and XRT   Cervicalgia    Cholelithiasis    DIVERTICULOSIS, COLON    ENDOMETRIOSIS    GERD (gastroesophageal reflux disease)    Heart palpitations    Hypertension    HYPOTHYROIDISM    Left wrist fracture 11/06/2016   10/2016 - fell of high stool   Personal history of chemotherapy    Personal history of radiation therapy    Pleural plaque without asbestos    CT chest 10/2010: R>L lobes, upper> lower   Squamous cell carcinoma    History of BC    Past Surgical History:  Procedure Laterality Date   BREAST LUMPECTOMY  2001   left   CATARACT EXTRACTION Bilateral 09/2014   both eyes   CHOLECYSTECTOMY  2010   ENDOMETRIAL ABLATION     EXCISION / BIOPSY BREAST / NIPPLE / DUCT Left 2001    Current Medications: Current Outpatient Medications on File Prior to Visit  Medication Sig   albuterol (VENTOLIN HFA) 108 (90 Base) MCG/ACT inhaler Inhale 2 puffs into the lungs every 6 (six) hours as needed for wheezing or shortness of breath.   atenolol (TENORMIN) 25 MG tablet TAKE 1 TABLET BY  MOUTH EVERY DAY   benzonatate (TESSALON) 200 MG capsule Take 1 capsule (200 mg total) by mouth 3 (three) times daily as needed for cough.   Calcium-Vitamin D-Vitamin K 564-332-95 MG-UNT-MCG TABS Take 1 tablet by mouth 2 (two) times daily.   chlorpheniramine (CHLOR-TRIMETON) 4 MG tablet Take 4 mg by mouth 2 (two) times daily as needed for allergies.   cholecalciferol (VITAMIN D) 25 MCG (1000 UNIT) tablet Take 1,000 Units by mouth daily.   [employer] ALLERGY RELIEF-D 5-120 MG tablet Take 1 tablet by mouth 2 (two) times daily.   EPINEPHrine 0.3 mg/0.3 mL IJ SOAJ injection Inject 0.3 mg into the muscle as  needed for anaphylaxis.   ipratropium (ATROVENT) 0.03 % nasal spray SPRAY 2 SPRAYS IN EACH NOSTRIL EVERY 12 HOURS   levothyroxine (SYNTHROID) 75 MCG tablet Take 1 tablet daily 30 minutes before breakfast 6 days a week   levothyroxine (SYNTHROID) 88 MCG tablet Take 1 tablet daily 30 minutes prior to breakfast once weekly   triamcinolone (KENALOG) 0.025 % ointment Apply 1 application topically 2 (two) times daily. Use as needed.   No current facility-administered medications on file prior to visit.     Allergies:   Molnupiravir, Allegra [fexofenadine], Gabapentin, Levaquin [levofloxacin], Nexium [esomeprazole magnesium], and Valerian root   Family History: The patient's family history includes Dementia in her mother; Emphysema in her father; Heart disease in her father. There is no history of Colon cancer, Esophageal cancer, or Rectal cancer. Her daughter had thyroid cancer and underwent total thyroidectomy.   ROS:   Please see the history of present illness.   (+) Left LE and hip pain Additional pertinent ROS otherwise unremarkable.  EKGs/Labs/Other Studies Reviewed:    The following studies were reviewed today:  Echocardiogram  01/02/2022: Sonographer Comments: Difficult images due to pectus excavatum.  IMPRESSIONS    1. Left ventricular ejection fraction, by estimation, is 60 to 65%. The  left ventricle has normal function. The left ventricle has no regional  wall motion abnormalities. Left ventricular diastolic function could not  be evaluated.   2. Right ventricular systolic function is normal. The right ventricular  size is normal. Tricuspid regurgitation signal is inadequate for assessing  PA pressure.   3. The mitral valve is grossly normal. Trivial mitral valve  regurgitation. No evidence of mitral stenosis.   4. The aortic valve is grossly normal. Aortic valve regurgitation is not  visualized. No aortic stenosis is present.   5. The inferior vena cava is normal in size  with greater than 50%  respiratory variability, suggesting right atrial pressure of 3 mmHg.   CTA Coronary  01/01/2022: IMPRESSION: 1. Coronary calcium score of 22.4. This was 53 percentile for age-, sex, and race-matched controls.   2. Normal coronary origin with right dominance.   3. Minimal plaque in the LAD and RCA.   RECOMMENDATIONS: CAD-RADS 1: Minimal non-obstructive CAD (0-24%). Consider non-atherosclerotic causes of chest pain. Consider preventive therapy and risk factor modification.  CTA Chest 12/30/21: IMPRESSION: 1. No evidence of pulmonary embolism or other acute intrathoracic process. 2. Multiple stable calcified and noncalcified areas of focal pleural thickening. This may represent sequelae associated with prior asbestos exposure.  CTA Chest 12/04/21 IMPRESSION: 1. Negative for pulmonary embolism. 2. Multiple areas of pleural plaque with variable degrees of calcification compatible with asbestos related pleural disease showing no change. 3. Pectus excavatum as before. 4. Suspect increased size of small sliding type hiatal hernia.   Aortic Atherosclerosis (ICD10-I70.0).  Monitor 06/16/2018 14 day  Zio patch. No VT, high degree AV block or pauses. Patient had a min HR of 44 bpm, max HR of 207 bpm, and avg HR of 72 bpm. Predominant underlying rhythm was Sinus Rhythm. First Degree AV Block was present.    72 Supraventricular Tachycardia runs occurred, the run with the fastest interval lasting 6 beats with a max rate of 207 bpm, the longest lasting 16 beats with an avg rate of 107 bpm. There were 23 triggered patient events, and supraventricular tachycardia was detected within +/- 45 seconds of symptomatic patient event(s). Isolated supraventricular and ventricular ectopy beats were rare (<1.0%).   Echo 05/21/2018 - Left ventricle: The cavity size was normal. Systolic function was    normal. The estimated ejection fraction was in the range of 60%    to 65%. Wall  motion was normal; there were no regional wall    motion abnormalities.  - Aortic valve: Transvalvular velocity was within the normal range.    There was no stenosis. There was mild regurgitation.  - Mitral valve: Transvalvular velocity was within the normal range.    There was no evidence for stenosis. There was trivial    regurgitation.  - Right ventricle: The cavity size was normal. Wall thickness was    normal. Systolic function was normal.  - Atrial septum: No defect or patent foramen ovale was identified.  - Tricuspid valve: There was trivial regurgitation.  - Pulmonary arteries: PA peak pressure: 19 mm Hg (S).    EKG:  EKG was personally reviewed 03/12/2022:  EKG was not ordered. 12/06/21: not ordered 06/27/2021: sinus rhythm at 69 bpm with first degree AV block, low voltage, PRWP 01/24/2020: sinus rhythm, first degree AV block, PVC/PAC, and low voltage.  Recent Labs: 12/04/2021: ALT 15; Magnesium 2.3 12/30/2021: B Natriuretic Peptide 128.2; BUN 22; Hemoglobin 12.9; Platelets 250; Potassium 4.0; Sodium 140 01/01/2022: Creatinine, Ser 1.00 01/31/2022: TSH 0.07   Recent Lipid Panel    Component Value Date/Time   CHOL 192 09/27/2021 1023   TRIG 139.0 09/27/2021 1023   TRIG 78 06/09/2006 0945   HDL 53.60 09/27/2021 1023   CHOLHDL 4 09/27/2021 1023   VLDL 27.8 09/27/2021 1023   LDLCALC 110 (H) 09/27/2021 1023   LDLCALC 138 (H) 03/23/2020 1027   LDLDIRECT 134.0 11/06/2016 1159    Physical Exam:    VS:  BP (!) 147/87 (BP Location: Right Arm, Patient Position: Sitting, Cuff Size: Normal)   Pulse 77   Ht 5' 1" (1.549 m)   Wt 120 lb 14.4 oz (54.8 kg)   SpO2 96%   BMI 22.84 kg/m     Wt Readings from Last 3 Encounters:  03/12/22 120 lb 14.4 oz (54.8 kg)  12/30/21 120 lb (54.4 kg)  12/24/21 121 lb 3.2 oz (55 kg)    GEN: Well nourished, well developed in no acute distress HEENT: Normal, moist mucous membranes NECK: No JVD CARDIAC: regular rhythm, normal S1 and S2, no rubs or  gallops. 1/6 systolic murmur. VASCULAR: Radial and DP pulses 2+ bilaterally. No carotid bruits RESPIRATORY:  Clear to auscultation without rales, wheezing or rhonchi  ABDOMEN: Soft, non-tender, non-distended MUSCULOSKELETAL:  Ambulates independently SKIN: Warm and dry, no edema NEUROLOGIC:  Alert and oriented x 3. No focal neuro deficits noted. PSYCHIATRIC:  Normal affect   ASSESSMENT:    1. Palpitations   2. Atypical chest pain   3. Murmur, cardiac   4. Shortness of breath    PLAN:    Chest pain: resolved with  Toradol, has not recurred  Shortness of breath: stable -CTPE 12/04/21 without PE or congestion. Chronic pleural plaque. Pectus excavatum -BNP 12/04/21 normal at 66 -euvolemic on exam  -O2 sat normal -unclear etiology, but not cardiac in origin  Palpitations, noted irregular heart beat:  -much improved on atenolol -echo, monitor as above -no high risk features such as syncope  Cardiac murmur: -no high risk findings on echo  CV risk factor counseling and prevention: -recommend heart healthy/Mediterranean diet, with whole grains, fruits, vegetable, fish, lean meats, nuts, and olive oil. Limit salt. -recommend moderate walking, 3-5 times/week for 30-50 minutes each session. Aim for at least 150 minutes.week. Goal should be pace of 3 miles/hours, or walking 1.5 miles in 30 minutes -recommend avoidance of tobacco products. Avoid excess alcohol. -ASCVD risk score: over the recommended age for statin for primary prevention The 10-year ASCVD risk score (Arnett DK, et al., 2019) is: 38.8%   Values used to calculate the score:     Age: 21 years     Sex: Female     Is Non-Hispanic African American: No     Diabetic: No     Tobacco smoker: No     Systolic Blood Pressure: 416 mmHg     Is BP treated: Yes     HDL Cholesterol: 53.6 mg/dL     Total Cholesterol: 192 mg/dL   Plan for follow up: 1 year or sooner PRN  Buford Dresser, MD, PhD [place]  [facility]    Medication Adjustments/Labs and Tests Ordered: Current medicines are reviewed at length with the patient today.  Concerns regarding medicines are outlined above.   No orders of the defined types were placed in this encounter.  No orders of the defined types were placed in this encounter.  Patient Instructions  Medication Instructions:  Your Physician recommend you continue on your current medication as directed.    *If you need a refill on your cardiac medications before your next appointment, please call your pharmacy*   Lab Work: None ordered today   Testing/Procedures: None ordered today   Follow-Up: At [facility], you and your health needs are our priority.  As part of our continuing mission to provide you with exceptional heart care, we have created designated Provider Care Teams.  These Care Teams include your primary Cardiologist (physician) and Advanced Practice Providers (APPs -  Physician Assistants and Nurse Practitioners) who all work together to provide you with the care you need, when you need it.  We recommend signing up for the patient portal called "MyChart".  Sign up information is provided on this After Visit Summary.  MyChart is used to connect with patients for Virtual Visits (Telemedicine).  Patients are able to view lab/test results, encounter notes, upcoming appointments, etc.  Non-urgent messages can be sent to your provider as well.   To learn more about what you can do with MyChart, go to [email or web address].    Your next appointment:   1 year(s)  The format for your next appointment:   In Person  Provider:   Buford Dresser, MD{          I,Mathew Stumpf,acting as a scribe for Buford Dresser, MD.,have documented all relevant documentation on the behalf of Buford Dresser, MD,as directed by  Buford Dresser, MD while in the presence of Buford Dresser, MD.  I, Buford Dresser, MD, have reviewed  all documentation for this visit. The documentation on 03/12/22 for the exam, diagnosis, procedures, and orders are all accurate and complete.  Signed, Buford Dresser, MD PhD 03/12/2022     [place]

## 2022-03-12 NOTE — Patient Instructions (Signed)

## 2022-03-14 ENCOUNTER — Other Ambulatory Visit (INDEPENDENT_AMBULATORY_CARE_PROVIDER_SITE_OTHER): Payer: Medicare HMO

## 2022-03-14 DIAGNOSIS — E038 Other specified hypothyroidism: Secondary | ICD-10-CM | POA: Diagnosis not present

## 2022-03-14 LAB — TSH: TSH: 0.14 u[IU]/mL — ABNORMAL LOW (ref 0.35–5.50)

## 2022-03-15 ENCOUNTER — Ambulatory Visit
Admission: RE | Admit: 2022-03-15 | Discharge: 2022-03-15 | Disposition: A | Discharge status: Home / Self Care | Payer: Medicare HMO | Source: Ambulatory Visit | Attending: Internal Medicine | Admitting: Internal Medicine

## 2022-03-15 DIAGNOSIS — M85869 Other specified disorders of bone density and structure, unspecified lower leg: Secondary | ICD-10-CM

## 2022-03-15 DIAGNOSIS — M810 Age-related osteoporosis without current pathological fracture: Secondary | ICD-10-CM | POA: Diagnosis not present

## 2022-03-15 DIAGNOSIS — Z780 Asymptomatic menopausal state: Secondary | ICD-10-CM | POA: Diagnosis not present

## 2022-03-15 DIAGNOSIS — M85852 Other specified disorders of bone density and structure, left thigh: Secondary | ICD-10-CM | POA: Diagnosis not present

## 2022-03-17 ENCOUNTER — Encounter: Payer: Self-pay | Admitting: Internal Medicine

## 2022-03-17 MED ORDER — LEVOTHYROXINE SODIUM 75 MCG PO TABS: ORAL_TABLET | ORAL | 3 refills | Status: DC

## 2022-03-24 ENCOUNTER — Encounter: Payer: Self-pay | Admitting: Internal Medicine

## 2022-03-24 NOTE — Progress Notes (Signed)
Subjective:    Patient ID: Emily [name], female    DOB: April 01, 1941, 81 y.o.   MRN: [id number]      HPI Emily [name] is here for  Chief Complaint  Patient presents with   Leg Pain    Left leg pain     Left leg pain - it started a few weeks ago.  It started in her left posterior hip area and goes down the lateral leg to the knee.  Sometimes feels it in the buttock area.   Pain with applying pressure on the leg and walking.  Sitting for long periods of time causes the pain.  She has pain with sleeping.   Sometimes numbness in the leg.  The leg feel week at times.   She has tried voltaren gel and tylenol and they have helped.     She has known DDD and bulging discs I the lumbar spine and has seen [facility] in the past.   OP - wants to discuss medication for her bones.   BP has been elevated at times - advised to monitor it.    Getting migraines headache - knows it is from her neck.  Has chronic neck pain. The pain starts in her neck and radiates up her head.     Medications and allergies reviewed with patient and updated if appropriate.  Current Outpatient Medications on File Prior to Visit  Medication Sig Dispense Refill   albuterol (VENTOLIN HFA) 108 (90 Base) MCG/ACT inhaler Inhale 2 puffs into the lungs every 6 (six) hours as needed for wheezing or shortness of breath. 8 g 0   atenolol (TENORMIN) 25 MG tablet TAKE 1 TABLET BY MOUTH EVERY DAY 90 tablet 1   benzonatate (TESSALON) 200 MG capsule Take 1 capsule (200 mg total) by mouth 3 (three) times daily as needed for cough. 30 capsule 11   Calcium-Vitamin D-Vitamin K 427-062-37 MG-UNT-MCG TABS Take 1 tablet by mouth 2 (two) times daily.     chlorpheniramine (CHLOR-TRIMETON) 4 MG tablet Take 4 mg by mouth 2 (two) times daily as needed for allergies.     cholecalciferol (VITAMIN D) 25 MCG (1000 UNIT) tablet Take 1,000 Units by mouth daily.     [employer] ALLERGY RELIEF-D 5-120 MG tablet Take 1 tablet by mouth 2 (two) times daily.      EPINEPHrine 0.3 mg/0.3 mL IJ SOAJ injection Inject 0.3 mg into the muscle as needed for anaphylaxis. 1 each 0   ipratropium (ATROVENT) 0.03 % nasal spray SPRAY 2 SPRAYS IN EACH NOSTRIL EVERY 12 HOURS 90 mL 0   levothyroxine (SYNTHROID) 75 MCG tablet Take 1 tablet daily 30 minutes before breakfast daily 90 tablet 3   triamcinolone (KENALOG) 0.025 % ointment Apply 1 application topically 2 (two) times daily. Use as needed. 30 g 1   No current facility-administered medications on file prior to visit.    Review of Systems  Musculoskeletal:  Positive for back pain.  Neurological:  Positive for weakness and numbness.       Objective:   Vitals:   03/25/22 1009  BP: 130/72  Pulse: 77  Temp: 98 F (36.7 C)  SpO2: 96%   BP Readings from Last 3 Encounters:  03/25/22 130/72  03/12/22 (!) 147/87  01/01/22 119/63   Wt Readings from Last 3 Encounters:  03/25/22 121 lb (54.9 kg)  03/12/22 120 lb 14.4 oz (54.8 kg)  12/30/21 120 lb (54.4 kg)   Body mass index is 22.86 kg/m.  Physical Exam Constitutional:      General: She is not in acute distress.    Appearance: Normal appearance.  HENT:     Head: Normocephalic and atraumatic.  Musculoskeletal:        General: Tenderness present. No deformity. Injury: no L spine tenderness, mild pain proximal central left buttock region,no leg pain with tenderness.    Right lower leg: No edema.     Left lower leg: No edema.  Skin:    General: Skin is warm and dry.  Neurological:     Mental Status: She is alert.     Sensory: No sensory deficit.     Motor: No weakness.     Comments: Left straight leg raise positive            Assessment & Plan:    See Problem List for Assessment and Plan of chronic medical problems.

## 2022-03-25 ENCOUNTER — Ambulatory Visit (INDEPENDENT_AMBULATORY_CARE_PROVIDER_SITE_OTHER): Payer: Medicare HMO | Admitting: Internal Medicine

## 2022-03-25 VITALS — BP 130/72 | HR 77 | Temp 98.0°F | Ht 61.0 in | Wt 121.0 lb

## 2022-03-25 DIAGNOSIS — M810 Age-related osteoporosis without current pathological fracture: Secondary | ICD-10-CM | POA: Diagnosis not present

## 2022-03-25 DIAGNOSIS — M5416 Radiculopathy, lumbar region: Secondary | ICD-10-CM

## 2022-03-25 DIAGNOSIS — M542 Cervicalgia: Secondary | ICD-10-CM | POA: Diagnosis not present

## 2022-03-25 DIAGNOSIS — M5431 Sciatica, right side: Secondary | ICD-10-CM | POA: Insufficient documentation

## 2022-03-25 DIAGNOSIS — G4486 Cervicogenic headache: Secondary | ICD-10-CM | POA: Diagnosis not present

## 2022-03-25 DIAGNOSIS — R002 Palpitations: Secondary | ICD-10-CM | POA: Diagnosis not present

## 2022-03-25 DIAGNOSIS — R030 Elevated blood-pressure reading, without diagnosis of hypertension: Secondary | ICD-10-CM

## 2022-03-25 MED ORDER — KETOROLAC TROMETHAMINE 60 MG/2ML IM SOLN
60.0000 mg | Freq: Once | INTRAMUSCULAR | Status: AC
  Administered 2022-03-25: 60 mg via INTRAMUSCULAR

## 2022-03-25 NOTE — Assessment & Plan Note (Signed)
Blood pressure here today is good, but she has had occasional high blood pressure readings-this may be situational-1 time she was in the emergency room She is on atenolol 25 mg daily for palpitations, which may be helping some with her blood pressure She will start monitoring her blood pressure at home to see if it is controlled or not

## 2022-03-25 NOTE — Patient Instructions (Addendum)
    A toradol injection was given.     Have an xray downstairs.     A referral was ordered for Physical therapy.     Someone from that office will call you to schedule an appointment.    Return if symptoms worsen or fail to improve.

## 2022-03-25 NOTE — Assessment & Plan Note (Signed)
Chronic Reviewed recent DEXA scan Discussed treatment options Best options for her probably Fosamax weekly or Evenity once a month for 12 months and then Fosamax for 1-2 years We will look into coverage for Evenity and then decide which treatment she would like

## 2022-03-25 NOTE — Assessment & Plan Note (Signed)
Chronic Secondary to SVT Continue atenolol 25 mg daily-can increase the dose if blood pressure is elevated intermittently

## 2022-03-25 NOTE — Assessment & Plan Note (Addendum)
Acute Started a few weeks ago Pain from her left/back region down her left leg to her knee, intermittent numbness, some weakness Tylenol and Voltaren gel are helping some, but has not seen any improvement Reviewed options for treatment Lumbar spine x-ray today Has had MRI in the past with orthopedics-I do not have access to that Toradol injection 60 mg x 1 today Referral for physical therapy Can refer to sports medicine or Ortho if symptoms or not improving Continue Tylenol and Voltaren gel

## 2022-03-25 NOTE — Assessment & Plan Note (Signed)
Chronic Has pain from her neck up to the head - has chronic neck pain Will refer to physical therapy, which would likely help Advised treating the neck when she has these headaches Has seen orthopedics in the past

## 2022-03-26 ENCOUNTER — Telehealth: Payer: Self-pay

## 2022-03-26 NOTE — Telephone Encounter (Signed)
Evenity VOB initiated via [email or web address]  New start  

## 2022-03-26 NOTE — Telephone Encounter (Signed)
Binnie Rail, MD  Jasper Loser, CMA; Marcina Millard, [place],   She has OP and has never been on medication.  Can you please check to see if evenity is covered and what the copay would be.     Thank  you so much.   Binnie Rail, MD

## 2022-03-28 NOTE — Telephone Encounter (Addendum)
Prior auth required for EVENITY  PA PROCESS DETAILS: PA is required. Please call [phone], or fax [phone] 

## 2022-03-31 ENCOUNTER — Other Ambulatory Visit: Payer: Self-pay | Admitting: Internal Medicine

## 2022-04-01 NOTE — Progress Notes (Unsigned)
81 y.o. G4P2002 Married Caucasian female here for yellow vaginal discharge.    Yellow discharge for 3 weeks.  No itching, burning, or odor.  No bleeding.   No pain.   No partner change.  Sexually active occasionally.   No exposures to new products.   No over the counter treatments used.   No recent antibiotics.   Has taken a lot of prednisone this year for a split tendon.   Not working out or spending time in a bathing suit.   Uses a bidet at home.  PCP:  Billey Gosling, MD   No LMP recorded. Patient is postmenopausal.           Sexually active: No.  The current method of family planning is post menopausal status.    Exercising:  Smoker:  no  Health Maintenance: Pap:  03-29-21 Neg, 09/29/2018 normal per patient History of abnormal Pap:  no MMG:  02-12-22 Neg/Birads1   reports that she has never smoked. She has never used smokeless tobacco. She reports that she does not drink alcohol and does not use drugs.  Past Medical History:  Diagnosis Date   Actinic keratosis    Allergic rhinitis    BACK PAIN    CARCINOMA, BREAST, 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   L s/p lumpectomy, chemo and XRT   Cervicalgia    Cholelithiasis    DIVERTICULOSIS, COLON    ENDOMETRIOSIS    GERD (gastroesophageal reflux disease)    Heart palpitations    Hypertension    HYPOTHYROIDISM    Left wrist fracture 11/06/2016   10/2016 - fell of high stool   Personal history of chemotherapy    Personal history of radiation therapy    Pleural plaque without asbestos    CT chest 10/2010: R>L lobes, upper> lower   Squamous cell carcinoma    History of BC    Past Surgical History:  Procedure Laterality Date   BREAST LUMPECTOMY  2001   left   CATARACT EXTRACTION Bilateral 09/2014   both eyes   CHOLECYSTECTOMY  2010   ENDOMETRIAL ABLATION     EXCISION / BIOPSY BREAST / NIPPLE / DUCT Left 2001    Current Outpatient Medications  Medication Sig Dispense Refill   albuterol (VENTOLIN HFA) 108 (90  Base) MCG/ACT inhaler Inhale 2 puffs into the lungs every 6 (six) hours as needed for wheezing or shortness of breath. 8 g 0   atenolol (TENORMIN) 25 MG tablet TAKE 1 TABLET BY MOUTH EVERY DAY 90 tablet 1   benzonatate (TESSALON) 200 MG capsule Take 1 capsule (200 mg total) by mouth 3 (three) times daily as needed for cough. 30 capsule 11   Calcium-Vitamin D-Vitamin K [id number] MG-UNT-MCG TABS Take 1 tablet by mouth 2 (two) times daily.     chlorpheniramine (CHLOR-TRIMETON) 4 MG tablet Take 4 mg by mouth 2 (two) times daily as needed for allergies.     cholecalciferol (VITAMIN D) 25 MCG (1000 UNIT) tablet Take 1,000 Units by mouth daily.     [employer]S ALLERGY RELIEF-D 5-120 MG tablet Take 1 tablet by mouth 2 (two) times daily.     EPINEPHrine 0.3 mg/0.3 mL IJ SOAJ injection Inject 0.3 mg into the muscle as needed for anaphylaxis. 1 each 0   ipratropium (ATROVENT) 0.03 % nasal spray SPRAY 2 SPRAYS IN EACH NOSTRIL EVERY 12 HOURS 90 mL 0   levothyroxine (SYNTHROID) 75 MCG tablet Take 1 tablet daily 30 minutes before breakfast daily 90 tablet 3   triamcinolone (KENALOG)  0.025 % ointment Apply 1 application topically 2 (two) times daily. Use as needed. 30 g 1   No current facility-administered medications for this visit.    Family History  Problem Relation Age of Onset   Dementia Mother    Heart disease Father    Emphysema Father        smoked and worked with asbestos   Colon cancer Neg Hx    Esophageal cancer Neg Hx    Rectal cancer Neg Hx     Review of Systems  Genitourinary:  Positive for vaginal discharge (yellow discharge).  All other systems reviewed and are negative.   Exam:   BP (!) 140/80   Ht 5' (1.524 m)   Wt 122 lb (55.3 kg)   BMI 23.83 kg/m     General appearance: alert, cooperative and appears stated age   Pelvic: External genitalia:  no lesions              No abnormal inguinal nodes palpated.              Urethra:  normal appearing urethra with no masses, tenderness or  lesions              Bartholins and Skenes: normal                 Vagina: normal appearing vagina with normal color and clear colorless discharge, no lesions              Cervix: no lesions              Speculum exam is tender. Bimanual Exam:  Uterus:  normal size, contour, position, consistency, mobility, non-tender              Adnexa: no mass, fullness, tenderness          Chaperone was present for exam:  Estill Bamberg, CMA  Assessment:   Vaginitis.  Atrophy.  Periodic steroid use for tendonitis.  Hx breast cancer, not estrogen receptor positive.  Stage III kidney disease.   Plan: We discussed various types of vaginitis and risk factors for infection.  Nuswab collected for vaginitis testing.  Final plan to follow.   After visit summary provided.   22 min  total time was spent for this patient encounter, including preparation, face-to-face counseling with the patient, coordination of care, and documentation of the encounter.

## 2022-04-02 ENCOUNTER — Ambulatory Visit (INDEPENDENT_AMBULATORY_CARE_PROVIDER_SITE_OTHER): Payer: Medicare HMO

## 2022-04-02 DIAGNOSIS — M5416 Radiculopathy, lumbar region: Secondary | ICD-10-CM | POA: Diagnosis not present

## 2022-04-02 DIAGNOSIS — M545 Low back pain, unspecified: Secondary | ICD-10-CM | POA: Diagnosis not present

## 2022-04-03 ENCOUNTER — Ambulatory Visit (INDEPENDENT_AMBULATORY_CARE_PROVIDER_SITE_OTHER): Payer: Medicare HMO | Admitting: Obstetrics and Gynecology

## 2022-04-03 ENCOUNTER — Other Ambulatory Visit (HOSPITAL_COMMUNITY)
Admission: RE | Admit: 2022-04-03 | Discharge: 2022-04-03 | Disposition: A | Discharge status: Home / Self Care | Payer: Medicare HMO | Source: Ambulatory Visit | Attending: Obstetrics and Gynecology | Admitting: Obstetrics and Gynecology

## 2022-04-03 ENCOUNTER — Encounter: Payer: Self-pay | Admitting: Obstetrics and Gynecology

## 2022-04-03 VITALS — BP 140/80 | Ht 60.0 in | Wt 122.0 lb

## 2022-04-03 DIAGNOSIS — N952 Postmenopausal atrophic vaginitis: Secondary | ICD-10-CM

## 2022-04-03 DIAGNOSIS — N898 Other specified noninflammatory disorders of vagina: Secondary | ICD-10-CM | POA: Insufficient documentation

## 2022-04-03 NOTE — Addendum Note (Signed)
Addended by: Yisroel Ramming, Statia Burdick E on: 04/03/2022 10:09 AM   Modules accepted: Orders

## 2022-04-03 NOTE — Patient Instructions (Signed)

## 2022-04-04 ENCOUNTER — Encounter: Payer: Self-pay | Admitting: Internal Medicine

## 2022-04-04 LAB — CERVICOVAGINAL ANCILLARY ONLY
Bacterial Vaginitis (gardnerella): NEGATIVE
Candida Glabrata: NEGATIVE
Candida Vaginitis: NEGATIVE
Comment: NEGATIVE
Comment: NEGATIVE
Comment: NEGATIVE
Comment: NEGATIVE
Trichomonas: NEGATIVE

## 2022-04-04 NOTE — Telephone Encounter (Signed)
Prior Authorization initiated for [facility] via [employer] Case ID: [id number]  [place]

## 2022-04-12 ENCOUNTER — Other Ambulatory Visit: Payer: Self-pay | Admitting: Internal Medicine

## 2022-04-12 DIAGNOSIS — M545 Low back pain, unspecified: Secondary | ICD-10-CM | POA: Diagnosis not present

## 2022-04-12 DIAGNOSIS — M542 Cervicalgia: Secondary | ICD-10-CM | POA: Diagnosis not present

## 2022-04-17 NOTE — Telephone Encounter (Addendum)
Coverage for EVENITY DENIED The office should receive a letter from [place] with reasons for denial.     [facility] for Evenity 8185  Medicare Part B Step Therapy Criteria For Medicare Advantage Plans That Do Not Offer Prescription Drug Coverage ([place]) Evenity, for the indication listed below:  Treatment of osteoporosis  Is not covered for new starts, unless the member meets ANY of the following:  Inadequate response to a trial of Prolia (denosumab) or IV zoledronic acid Intolerable adverse event with Prolia (denosumab) or IV zoledronic acid Prolia (denosumab) or IV zoledronic acid is contraindicated for the member. For Medicare Advantage Plans That Offer Prescription Drug Coverage (MAPD) Evenity, for the indication listed below:  Osteoporosis  Is not covered for new starts, unless the member meets ANY of the following:  Inadequate response to a trial of Forteo  Intolerable adverse event to [employer] is contraindicated for the member Two-year treatment with parathyroid hormone has been reached for the member. Policy Note: Requires Precertification:  Medicare Part B plans: Precertification of romosozumab-aqqg Field seismologist) is required of participating providers and members in applicable Medicare Part B plan designs. For precertification of romosozumab-aqqg [place] [place]), call [phone] or fax [phone].  Criteria for Initial Approval [employer]na considers romosozumab-aqqg Field seismologist) medically necessary for postmenopausal osteoporosis treatment when any of the following criteria are met:  Member has a history of fragility fractures; or Member has a pre-treatment T-score less than or equal to -2.5 or member has osteopenia (i.e., pre-treatment T-score greater than -2.5 and less than -1) with a high pre-treatment FRAX fracture probability (see Appendix B) and meets any of the following criteria:  Member has indicators of very high fracture risk (e.g., advanced age, frailty,  glucocorticoid use, very low T-scores [less than or equal to -3], or increased fall risk); or Member has failed prior treatment with or is intolerant to previous injectable osteoporosis therapy (e.g., zoledronic acid [Reclast], teriparatide [Forteo, Bonsity], denosumab [Prolia], abaloparatide [Tymlos])); or Member has had an oral bisphosphonate trial of at least 1-year duration or there is a clinical reason to avoid treatment with an oral bisphosphonate (see Appendix A). [facility] considers all other indications as experimental and investigational (for additional information, see Experimental and Investigational or Not Medically Necessary and Background sections).  Continuation of Therapy [facility] considers continuation of romosozumab-aqqg Field seismologist) therapy medically necessary for all members (including new members) who meet all initial selection criteria and have received less than 12 monthly doses of Evenity.  Dosage and Administration Evenity (romosozumab-aqqg) is available as 105 mg/1.17 mL solution in a single-use prefilled syringe. A full dose of Evenity requires two single-use prefilled syringes. Evenity should be administered by a healthcare provider.   The recommended dose of Evenity is 210 mg administered monthly via subcutaneous injection (injecting two 105 mg/1.17 mL prefilled syringes, one after the other) in the abdomen, thigh or upper arm. Treatment duration for Evenity is 12 monthly doses, as the anabolic effect of Evenity wanes after 12 monthly doses of therapy. If osteoporosis therapy remains warranted, continued therapy with an anti-resorptive agent should be considered.  Adequately supplement calcium and vitamin D during treatment.  Source: [employer]gen, 2020  Experimental and Investigational or Not Medically Necessary [facility] considers monthly injection of romosozumab-aqqg (Evenity) contraindicated and not medically necessary for members who have had a myocardial infarction or stroke within  the preceding year.   [employer]etna considers romosozumab experimental and investigational for the following indications (not an all-inclusive list) because its effectiveness for these indications has not been  established:   Glucocorticoid-induced osteoporosis Osteoporosis related to maintenance hemodialysis Osteoporosis related to osteogenesis imperfecta Treatment of renal osteodystrophy Treatment of tibial diaphyseal fracture or hip fracture not related to postmenopause osteoporosis.

## 2022-04-18 ENCOUNTER — Encounter: Payer: Self-pay | Admitting: Internal Medicine

## 2022-04-18 DIAGNOSIS — R519 Headache, unspecified: Secondary | ICD-10-CM

## 2022-04-18 NOTE — Telephone Encounter (Signed)
Message sent to patient via MyChart to discuss other options.

## 2022-04-24 DIAGNOSIS — M5030 Other cervical disc degeneration, unspecified cervical region: Secondary | ICD-10-CM | POA: Diagnosis not present

## 2022-04-24 DIAGNOSIS — M542 Cervicalgia: Secondary | ICD-10-CM | POA: Diagnosis not present

## 2022-04-24 DIAGNOSIS — M5416 Radiculopathy, lumbar region: Secondary | ICD-10-CM | POA: Diagnosis not present

## 2022-04-26 ENCOUNTER — Encounter (HOSPITAL_BASED_OUTPATIENT_CLINIC_OR_DEPARTMENT_OTHER): Payer: Self-pay | Admitting: Cardiology

## 2022-05-06 ENCOUNTER — Ambulatory Visit (HOSPITAL_BASED_OUTPATIENT_CLINIC_OR_DEPARTMENT_OTHER): Payer: Medicare HMO | Admitting: Physical Therapy

## 2022-05-07 DIAGNOSIS — M5416 Radiculopathy, lumbar region: Secondary | ICD-10-CM | POA: Diagnosis not present

## 2022-05-07 NOTE — Telephone Encounter (Signed)
Pt archived in [email or web address].  Please advise if patient and/or provider wish to proceed with Prolia therpay.  

## 2022-05-16 ENCOUNTER — Ambulatory Visit: Payer: Medicare HMO | Admitting: Neurology

## 2022-05-16 ENCOUNTER — Telehealth: Payer: Self-pay | Admitting: Neurology

## 2022-05-16 ENCOUNTER — Encounter: Payer: Self-pay | Admitting: Neurology

## 2022-05-16 VITALS — BP 127/89 | HR 61 | Ht 61.0 in | Wt 122.2 lb

## 2022-05-16 DIAGNOSIS — M542 Cervicalgia: Secondary | ICD-10-CM | POA: Diagnosis not present

## 2022-05-16 DIAGNOSIS — R292 Abnormal reflex: Secondary | ICD-10-CM | POA: Diagnosis not present

## 2022-05-16 DIAGNOSIS — M5481 Occipital neuralgia: Secondary | ICD-10-CM | POA: Diagnosis not present

## 2022-05-16 DIAGNOSIS — G8929 Other chronic pain: Secondary | ICD-10-CM | POA: Diagnosis not present

## 2022-05-16 DIAGNOSIS — R29898 Other symptoms and signs involving the musculoskeletal system: Secondary | ICD-10-CM | POA: Diagnosis not present

## 2022-05-16 DIAGNOSIS — M5442 Lumbago with sciatica, left side: Secondary | ICD-10-CM

## 2022-05-16 DIAGNOSIS — R200 Anesthesia of skin: Secondary | ICD-10-CM

## 2022-05-16 MED ORDER — METHYLPREDNISOLONE 4 MG PO TBPK: ORAL_TABLET | ORAL | 1 refills | Status: DC

## 2022-05-16 NOTE — Patient Instructions (Signed)
MRI of the cervical and lumbar spine Then recommend going to Emily [name] for further injections or ablations or surgical eval based on results Medrol dosepak Consider PT after imaging results  Methylprednisolone Tablets What is this medication? METHYLPREDNISOLONE (meth ill pred NISS oh lone) treats many conditions such as asthma, allergic reactions, arthritis, inflammatory bowel diseases, adrenal, and blood or bone marrow disorders. It works by decreasing inflammation, slowing down an overactive immune system, or replacing cortisol normally made in the body. Cortisol is a hormone that plays an important role in how the body responds to stress, illness, and injury. It belongs to a group of medications called steroids. This medicine may be used for other purposes; ask your health care provider or pharmacist if you have questions. COMMON BRAND NAME(S): Medrol, Medrol Dosepak What should I tell my care team before I take this medication? They need to know if you have any of these conditions: Cushing's syndrome Eye disease, vision problems Diabetes Glaucoma Heart disease High blood pressure Infection (especially a virus infection such as chickenpox, cold sores, or herpes) Liver disease Mental illness Myasthenia gravis Osteoporosis Recently received or scheduled to receive a vaccine Seizures Stomach or intestine problems Thyroid disease An unusual or allergic reaction to lactose, methylprednisolone, other medications, foods, dyes, or preservatives Pregnant or trying to get pregnant Breast-feeding How should I use this medication? Take this medication by mouth with a glass of water. Follow the directions on the prescription label. Take this medication with food. If you are taking this medication once a day, take it in the morning. Do not take it more often than directed. Do not suddenly stop taking your medication because you may develop a severe reaction. Your care team will tell you how much  medication to take. If your care team wants you to stop the medication, the dose may be slowly lowered over time to avoid any side effects. Talk to your care team about the use of this medication in children. Special care may be needed. Overdosage: If you think you have taken too much of this medicine contact a poison control center or emergency room at once. NOTE: This medicine is only for you. Do not share this medicine with others. What if I miss a dose? If you miss a dose, take it as soon as you can. If it is almost time for your next dose, talk to your care team. You may need to miss a dose or take an extra dose. Do not take double or extra doses without advice. What may interact with this medication? Do not take this medication with any of the following: Alefacept Echinacea Live virus vaccines Metyrapone Mifepristone This medication may also interact with the following: Amphotericin B Aspirin and aspirin-like medications Certain antibiotics like erythromycin, clarithromycin, troleandomycin Certain medications for diabetes Certain medications for fungal infections like ketoconazole Certain medications for seizures like carbamazepine, phenobarbital, phenytoin Certain medications that treat or prevent blood clots like warfarin Cholestyramine Cyclosporine Digoxin Diuretics Female hormones, like estrogens and birth control pills Isoniazid NSAIDs, medications for pain inflammation, like ibuprofen or naproxen Other medications for myasthenia gravis Rifampin Vaccines This list may not describe all possible interactions. Give your health care provider a list of all the medicines, herbs, non-prescription drugs, or dietary supplements you use. Also tell them if you smoke, drink alcohol, or use illegal drugs. Some items may interact with your medicine. What should I watch for while using this medication? Tell your care team if your symptoms do not start to  get better or if they get worse.  Do not stop taking except on your care team's advice. You may develop a severe reaction. Your care team will tell you how much medication to take. This medication may increase your risk of getting an infection. Tell your care team if you are around anyone with measles or chickenpox, or if you develop sores or blisters that do not heal properly. This medication may increase blood sugar levels. Ask your care team if changes in diet or medications are needed if you have diabetes. Tell your care team right away if you have any change in your eyesight. Using this medication for a long time may increase your risk of low bone mass. Talk to your care team about bone health. What side effects may I notice from receiving this medication? Side effects that you should report to your care team as soon as possible: Allergic reactions--skin rash, itching, hives, swelling of the face, lips, tongue, or throat Cushing syndrome--increased fat around the midsection, upper back, neck, or face, pink or purple stretch marks on the skin, thinning, fragile skin that easily bruises, unexpected hair growth High blood sugar (hyperglycemia)--increased thirst or amount of urine, unusual weakness or fatigue, blurry vision Increase in blood pressure Infection--fever, chills, cough, sore throat, wounds that don't heal, pain or trouble when passing urine, general feeling of discomfort or being unwell Low adrenal gland function--nausea, vomiting, loss of appetite, unusual weakness or fatigue, dizziness Mood and behavior changes--anxiety, nervousness, confusion, hallucinations, irritability, hostility, thoughts of suicide or self-harm, worsening mood, feelings of depression Stomach bleeding--bloody or black, tar-like stools, vomiting blood or brown material that looks like coffee grounds Swelling of the ankles, hands, or feet Side effects that usually do not require medical attention (report to your care team if they continue or are  bothersome): Acne General discomfort and fatigue Headache Increase in appetite Nausea Trouble sleeping Weight gain This list may not describe all possible side effects. Call your doctor for medical advice about side effects. You may report side effects to FDA at 1-800-FDA-1088. Where should I keep my medication? Keep out of the reach of children and pets. Store at room temperature between 20 and 25 degrees C (68 and 77 degrees F). Throw away any unused medication after the expiration date. NOTE: This sheet is a summary. It may not cover all possible information. If you have questions about this medicine, talk to your doctor, pharmacist, or health care provider.  2023 Elsevier/Gold Standard (2007-09-26 00:00:00)

## 2022-05-16 NOTE — Telephone Encounter (Signed)
Referral faxed to Dr. Nelva Bush at [facility], phone # [phone].

## 2022-05-16 NOTE — Progress Notes (Signed)
[id number]    Provider:  Dr Jaynee Eagles Requesting Provider: Binnie Rail, MD Primary Care Provider:  Binnie Rail, MD  CC:  headaches  HPI:  Emily [name] is a 81 y.o. female here as requested by Binnie Rail, MD for Headaches. PMHx hypothyroid, lumbar radiculopathy, cervical DDD, CKD, HLD, hyperglycemia, chronic neck pain.  Pt states she had a epidural last week in spine pt states since than no headaches Pt states before epidural headaches daily . Pt states pain started in neck and went of forehead  Pt had CT of spine has CD with her today from [facility]. She sees Dr. Nelva Bush for epidural steroid injections. She has a lot of arthritis in the neck, pain starts in the back of the head and neck and radiates to the front. She had injections years ago into the neck in 2018-2019. Hurts worse at night when sleeping on it. She had a lot of low back pain and sciatica. Neck pain for years. 6 weeks started having this radiating pain from her neck to the back of the head. Dr. Nelva Bush gave him a steroid epidural and it stopped. She is still having pain in the lower spine, she can hardly walk, the shot helped the the headache but not the low back pain temporarily. Low back pain, shooting down her leg on the left side, weakness and pain in the left leg worsening. No pain in the arms. Worsening neck and low back pain. No other focal neurologic deficits, associated symptoms, inciting events or modifiable factors.  Reviewed notes, labs and imaging from outside physicians, which showed:   XR c-spine: FINDINGS: Straightening of the normal cervical lordosis with slight reversal. There is multilevel degenerative disc disease, moderate at C4-C5 and severe at C5-C6 and C6-C7. There is mild to moderate multilevel facet arthropathy. Trace degenerative anterolisthesis at C3-C4.   IMPRESSION: Multilevel degenerative disc disease, moderate at C4-C5 and severe at C5-C6 and C6-C7.   Mild to  moderate multilevel facet arthropathy.   Trace degenerative anterolisthesis at C3-C4.     Electronically Signed   By: Maurine Simmering M.D.   On: 04/11/2021 14:05  CLINICAL DATA:  Low back pain for 1 month.   EXAM: LUMBAR SPINE - COMPLETE 4+ VIEW   COMPARISON:  None Available.   FINDINGS: There is no evidence of lumbar spine fracture. Scoliosis of spine noted. Mild degenerative joint changes at L5-S1, L1-2, T12-L1 with narrowed joint space and osteophyte formation noted.   IMPRESSION: Mild degenerative joint changes of lumbar spine.     Electronically Signed   By: Abelardo Diesel M.D.   On: 04/03/2022 14:00  12/2021: cbc/cmp unremarkable except foe slightly elevated glucose (may not have been fasting) and slightly elevated creatinine 1.13  Review of Systems: Patient complains of symptoms per HPI as well as the following symptoms neck and back pain. Pertinent negatives and positives per HPI. All others negative.   Social History   Socioeconomic History   Marital status: Married    Spouse name: Not on file   Number of children: 2   Years of education: Not on file   Highest education level: Not on file  Occupational History   Occupation: Retired    Comment: Chiropractor  Tobacco Use   Smoking status: Never   Smokeless tobacco: Never  Vaping Use   Vaping Use: Never used  Substance and Sexual Activity   Alcohol use: No    Comment: 1 drink every 2 months  Drug use: No   Sexual activity: Not Currently    Birth control/protection: Post-menopausal  Other Topics Concern   Not on file  Social History Narrative   Married, lives in [place] area since 2008 from [place] to be close to dtr   Social Determinants of Health   Financial Resource Strain: Low Risk  (01/22/2022)   Overall Financial Resource Strain (CARDIA)    Difficulty of Paying Living Expenses: Not hard at all  Food Insecurity: No Food Insecurity (01/22/2022)   Hunger Vital Sign    Worried About Running Out of  Food in the Last Year: Never true    Ran Out of Food in the Last Year: Never true  Transportation Needs: No Transportation Needs (01/22/2022)   PRAPARE - Hydrologist (Medical): No    Lack of Transportation (Non-Medical): No  Physical Activity: Sufficiently Active (01/22/2022)   Exercise Vital Sign    Days of Exercise per Week: 5 days    Minutes of Exercise per Session: 30 min  Stress: No Stress Concern Present (01/22/2022)   [place]    Feeling of Stress : Not at all  Social Connections: Socially Integrated (01/22/2022)   Social Connection and Isolation Panel [NHANES]    Frequency of Communication with Friends and Family: More than three times a week    Frequency of Social Gatherings with Friends and Family: More than three times a week    Attends Religious Services: More than 4 times per year    Active Member of Clubs or Organizations: Yes    Attends Archivist Meetings: More than 4 times per year    Marital Status: Married  Human resources officer Violence: Not At Risk (01/22/2022)   Humiliation, Afraid, Rape, and Kick questionnaire    Fear of Current or Ex-Partner: No    Emotionally Abused: No    Physically Abused: No    Sexually Abused: No    Family History  Problem Relation Age of Onset   Dementia Mother    Heart disease Father    Emphysema Father        smoked and worked with asbestos   Colon cancer Neg Hx    Esophageal cancer Neg Hx    Rectal cancer Neg Hx    Migraines Neg Hx    Headache Neg Hx     Past Medical History:  Diagnosis Date   Actinic keratosis    Allergic rhinitis    BACK PAIN    CARCINOMA, BREAST, 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   L s/p lumpectomy, chemo and XRT   Cervicalgia    Cholelithiasis    DIVERTICULOSIS, COLON    ENDOMETRIOSIS    GERD (gastroesophageal reflux disease)    Heart palpitations    Hypertension    HYPOTHYROIDISM    Left wrist  fracture 11/06/2016   10/2016 - fell of high stool   Osteoporosis    Personal history of chemotherapy    Personal history of radiation therapy    Pleural plaque without asbestos    CT chest 10/2010: R>L lobes, upper> lower   Squamous cell carcinoma    History of BC    Patient Active Problem List   Diagnosis Date Noted   Lumbar radiculopathy 03/25/2022   Cough 12/27/2021   Sleep difficulties 09/27/2021   Cervicogenic headache 04/10/2021   Acute bronchitis 04/10/2021   Elevated blood pressure reading 04/10/2021   Skin abnormality 02/27/2021   Aortic atherosclerosis ([place])  09/22/2020   Urinary urgency 03/23/2020   CKD (chronic kidney disease) stage 3, GFR 30-59 ml/min (HCC) 03/22/2020   Hyperglycemia 03/22/2020   Change in stool 11/17/2019   Right-sided chest wall pain 11/17/2019   Urinary retention 09/23/2019   SOB (shortness of breath) 01/15/2019   Upper airway cough syndrome 01/15/2019   Belching 12/14/2018   Sinus infection 12/14/2018   Reactive airway disease 12/05/2018   Tear of left rotator cuff 10/05/2018   Pre-syncope 05/19/2018   DDD (degenerative disc disease), cervical 01/15/2018   Neuralgia 09/12/2017   Presbycusis of both ears 12/11/2016   Memory difficulties 11/06/2016   IBS (irritable bowel syndrome) 04/16/2016   Hyperlipidemia 09/10/2015   Osteoporosis 09/08/2015   Palpitations    Pleural plaque without asbestos 12/03/2010   Chronic neck pain 12/18/2009   DIVERTICULOSIS, COLON 03/02/2009   Lumbago 03/11/2008   Actinic keratosis 01/23/2008   CARCINOMA, BREAST, ESTROGEN RECEPTOR NEGATIVE 10/12/2007   Hypothyroidism 06/18/2007   Allergic rhinitis 06/11/2007    Past Surgical History:  Procedure Laterality Date   BREAST LUMPECTOMY  2001   left   CATARACT EXTRACTION Bilateral 09/2014   both eyes   CHOLECYSTECTOMY  2010   ENDOMETRIAL ABLATION     EXCISION / BIOPSY BREAST / NIPPLE / DUCT Left 2001    Current Outpatient Medications  Medication Sig  Dispense Refill   Acetaminophen (TYLENOL 8 HOUR ARTHRITIS PAIN PO) Take by mouth.     albuterol (VENTOLIN HFA) 108 (90 Base) MCG/ACT inhaler Inhale 2 puffs into the lungs every 6 (six) hours as needed for wheezing or shortness of breath. 8 g 0   atenolol (TENORMIN) 25 MG tablet TAKE 1 TABLET BY MOUTH EVERY DAY 90 tablet 1   benzonatate (TESSALON) 200 MG capsule Take 1 capsule (200 mg total) by mouth 3 (three) times daily as needed for cough. 30 capsule 11   Calcium-Vitamin D-Vitamin K 211-941-74 MG-UNT-MCG TABS Take 1 tablet by mouth 2 (two) times daily.     chlorpheniramine (CHLOR-TRIMETON) 4 MG tablet Take 4 mg by mouth 2 (two) times daily as needed for allergies.     cholecalciferol (VITAMIN D) 25 MCG (1000 UNIT) tablet Take 1,000 Units by mouth daily.     [employer] ALLERGY RELIEF D 5-120 MG tablet TAKE 1 TABLET BY MOUTH TWICE A DAY 48 tablet 14   EPINEPHrine 0.3 mg/0.3 mL IJ SOAJ injection Inject 0.3 mg into the muscle as needed for anaphylaxis. 1 each 0   ipratropium (ATROVENT) 0.03 % nasal spray SPRAY 2 SPRAYS IN EACH NOSTRIL EVERY 12 HOURS 90 mL 0   levothyroxine (SYNTHROID) 75 MCG tablet Take 1 tablet daily 30 minutes before breakfast daily 90 tablet 3   methylPREDNISolone (MEDROL DOSEPAK) 4 MG TBPK tablet Take pills daily all together with food. Take the first dose (6 pills) as soon as possible. Take the rest each morning. For 6 days total 6-5-4-3-2-1. 21 tablet 1   triamcinolone (KENALOG) 0.025 % ointment Apply 1 application topically 2 (two) times daily. Use as needed. 30 g 1   No current facility-administered medications for this visit.    Allergies as of 05/16/2022 - Review Complete 05/16/2022  Allergen Reaction Noted   Molnupiravir Other (See Comments) 05/23/2021   Allegra [fexofenadine]  10/22/2019   Gabapentin  04/10/2021   Levaquin [levofloxacin] Swelling and Other (See Comments) 12/14/2016   Nexium [esomeprazole magnesium]  04/26/2014   Valerian root Other (See Comments)  03/23/2020    Vitals: BP 127/89   Pulse 61  Ht '5\' 1"'$  (1.549 m)   Wt 122 lb 3.2 oz (55.4 kg)   BMI 23.09 kg/m  Last Weight:  Wt Readings from Last 1 Encounters:  05/16/22 122 lb 3.2 oz (55.4 kg)   Last Height:   Ht Readings from Last 1 Encounters:  05/16/22 '5\' 1"'$  (1.549 m)     Physical exam: Exam: Gen: NAD, conversant, well nourised, well groomed                     CV: RRR, no MRG. No Carotid Bruits. No peripheral edema, warm, nontender Eyes: Conjunctivae clear without exudates or hemorrhage  Neuro: Detailed Neurologic Exam  Speech:    Speech is normal; fluent and spontaneous with normal comprehension.  Cognition:    The patient is oriented to person, place, and time;     recent and remote memory intact;     language fluent;     normal attention, concentration,     fund of knowledge Cranial Nerves:    The pupils are equal, round, and reactive to light. The fundi are normal and spontaneous venous pulsations are present. Visual fields are full to finger confrontation. Extraocular movements are intact. Trigeminal sensation is intact and the muscles of mastication are normal. The face is symmetric. The palate elevates in the midline. Hearing intact. Voice is normal. Shoulder shrug is normal. The tongue has normal motion without fasciculations.   Coordination:    Normal finger to nose and heel to shin. Normal rapid alternating movements.   Gait:    antalgic.   Motor Observation:    No asymmetry, no atrophy, and no involuntary movements noted. Tone:    Normal muscle tone.    Posture:    Posture is normal. normal erect    Strength: left triceps slightly weaker than the right. Weakness left leg flexion and foot dorsiflexion both 4/5 otherwise strength is V/V in the upper and lower limbs.      Sensation: numbness in sciatic distribution     Reflex Exam:  DTR's:    Deep tendon reflexes in the upper and lower extremities are brisk bilaterally.   Toes:    The toes  are downgoing bilaterally.   Clonus:    Clonus is absent.    Assessment/Plan:  Patient with chronic neck and low back pain, significant degenerative disk disease under the care of physicians for years, tried conservative measures, bee getting injections, also Pt in the past > 3 months, worsening neck pain and low back pain with radiculopathy in both areas that is worsening despite medical management will order MRIs and resend to Dr. Nelva Bush for consideration of procedures or surgical evaluation. She has upper and lower weakness on exam in a dermatomal pattern. Headaches are likely from her neck, cervicalgia; and sciatica of her lower back. Given brisk reflexes need to evaluate for myelopathy.  - repeat MRI of the cervical spine and lumbar spine for cervical and lumbar stenosis and radiculopathy - please compare to prior , Dr Nelva Bush will have access to these when they are completed  -She has done well in the past on a medrol dosepak will give again she is having sciatica and the pain in the neck is coming back, hasn't has a medrol dosepak recently.  -Referring back to Dr. Nelva Bush for neck injections/procedures (medial branch blocks possibly for cervicalgia/occipital neuralgia or RFA or what he thinks is clinically warranted) and low back injections or procedures for cervicalgia/occipital neuralgia and left leg sciatica Consider PT after  imaging results  Orders Placed This Encounter  Procedures   MR CERVICAL SPINE WO CONTRAST   MR LUMBAR SPINE WO CONTRAST   Ambulatory referral to Pain Clinic   Meds ordered this encounter  Medications   methylPREDNISolone (MEDROL DOSEPAK) 4 MG TBPK tablet    Sig: Take pills daily all together with food. Take the first dose (6 pills) as soon as possible. Take the rest each morning. For 6 days total 6-5-4-3-2-1.    Dispense:  21 tablet    Refill:  1    Cc: Burns, [name], MD,  Binnie Rail, MD  Sarina Ill, MD  [facility] [address] [place], [place] [address]  Phone [phone] Fax [phone]

## 2022-05-16 NOTE — Telephone Encounter (Signed)
[employer] medicare sent to [facility] they obtain auth  

## 2022-05-17 ENCOUNTER — Ambulatory Visit: Payer: Medicare HMO | Admitting: Neurology

## 2022-05-23 ENCOUNTER — Other Ambulatory Visit (INDEPENDENT_AMBULATORY_CARE_PROVIDER_SITE_OTHER): Payer: Medicare HMO

## 2022-05-23 DIAGNOSIS — E038 Other specified hypothyroidism: Secondary | ICD-10-CM

## 2022-05-23 LAB — TSH: TSH: 0.15 u[IU]/mL — ABNORMAL LOW (ref 0.35–5.50)

## 2022-05-23 LAB — T4, FREE: Free T4: 1.1 ng/dL (ref 0.60–1.60)

## 2022-05-25 MED ORDER — LEVOTHYROXINE SODIUM 75 MCG PO TABS: ORAL_TABLET | ORAL | 3 refills | Status: DC

## 2022-05-26 ENCOUNTER — Other Ambulatory Visit: Payer: Medicare HMO

## 2022-05-29 ENCOUNTER — Telehealth: Payer: Self-pay | Admitting: Neurology

## 2022-05-29 NOTE — Telephone Encounter (Signed)
[facility] is handling her Biomedical engineer. I sent them a message to look at this. Their number is [phone].

## 2022-05-29 NOTE — Telephone Encounter (Signed)
From [facility]: pt authorization for her MRI, is pending clinical review, I sent the last office note along with the authorization but aparently that wasn't suficient enough. Her [employer] case # is [id number]   I advised them to send the results of her last MRI since she has only been seen in our office once.

## 2022-05-29 NOTE — Telephone Encounter (Signed)
Pt is calling. Said she called her insurance company to follow up on her MRI and way told they have made several attempts to get more information from her doctor. Pt said can someone please call her insurance. Ref# [id number]

## 2022-05-30 NOTE — Telephone Encounter (Signed)
With all the information we have provided, they are requiring a peer to peer be done. Please call [phone] [employer] case # is [id number]. Follow the prompts for peer to peer.

## 2022-06-03 NOTE — Telephone Encounter (Signed)
Called pt & LVM (ok per DPR) advising that her insurance declined the imaging that Dr Jaynee Eagles ordered. Per Dr Jaynee Eagles, patient will have to see Dr Nelva Bush and see if he can order it instead. Pt sees him regularly for injections anyway. Can call us back or replay to [employer].

## 2022-06-03 NOTE — Telephone Encounter (Signed)
Emily Beam, MD  [facility] Calls 51 minutes ago (1:02 PM)    Let her know insurance declined the imaging from me, she will have to see Dr. Nelva Bush and see if he can order it instead. She sees him regularly for injections anyway. thanks

## 2022-06-12 DIAGNOSIS — M5030 Other cervical disc degeneration, unspecified cervical region: Secondary | ICD-10-CM | POA: Diagnosis not present

## 2022-06-12 DIAGNOSIS — M542 Cervicalgia: Secondary | ICD-10-CM | POA: Diagnosis not present

## 2022-06-12 DIAGNOSIS — M5416 Radiculopathy, lumbar region: Secondary | ICD-10-CM | POA: Diagnosis not present

## 2022-06-19 DIAGNOSIS — M25562 Pain in left knee: Secondary | ICD-10-CM | POA: Diagnosis not present

## 2022-06-19 DIAGNOSIS — M48061 Spinal stenosis, lumbar region without neurogenic claudication: Secondary | ICD-10-CM | POA: Diagnosis not present

## 2022-06-19 DIAGNOSIS — M7072 Other bursitis of hip, left hip: Secondary | ICD-10-CM | POA: Diagnosis not present

## 2022-06-19 DIAGNOSIS — M7632 Iliotibial band syndrome, left leg: Secondary | ICD-10-CM | POA: Diagnosis not present

## 2022-06-30 DIAGNOSIS — M5416 Radiculopathy, lumbar region: Secondary | ICD-10-CM | POA: Diagnosis not present

## 2022-06-30 DIAGNOSIS — M542 Cervicalgia: Secondary | ICD-10-CM | POA: Diagnosis not present

## 2022-07-03 DIAGNOSIS — M25552 Pain in left hip: Secondary | ICD-10-CM | POA: Diagnosis not present

## 2022-07-03 DIAGNOSIS — M5136 Other intervertebral disc degeneration, lumbar region: Secondary | ICD-10-CM | POA: Diagnosis not present

## 2022-07-09 DIAGNOSIS — M25552 Pain in left hip: Secondary | ICD-10-CM | POA: Diagnosis not present

## 2022-07-09 DIAGNOSIS — M48061 Spinal stenosis, lumbar region without neurogenic claudication: Secondary | ICD-10-CM | POA: Diagnosis not present

## 2022-07-15 DIAGNOSIS — M48061 Spinal stenosis, lumbar region without neurogenic claudication: Secondary | ICD-10-CM | POA: Diagnosis not present

## 2022-07-15 DIAGNOSIS — M25552 Pain in left hip: Secondary | ICD-10-CM | POA: Diagnosis not present

## 2022-07-16 DIAGNOSIS — M5416 Radiculopathy, lumbar region: Secondary | ICD-10-CM | POA: Diagnosis not present

## 2022-07-16 DIAGNOSIS — M5030 Other cervical disc degeneration, unspecified cervical region: Secondary | ICD-10-CM | POA: Diagnosis not present

## 2022-07-18 DIAGNOSIS — M25552 Pain in left hip: Secondary | ICD-10-CM | POA: Diagnosis not present

## 2022-07-18 DIAGNOSIS — M48061 Spinal stenosis, lumbar region without neurogenic claudication: Secondary | ICD-10-CM | POA: Diagnosis not present

## 2022-07-23 DIAGNOSIS — M7632 Iliotibial band syndrome, left leg: Secondary | ICD-10-CM | POA: Diagnosis not present

## 2022-07-23 DIAGNOSIS — M7072 Other bursitis of hip, left hip: Secondary | ICD-10-CM | POA: Diagnosis not present

## 2022-07-23 DIAGNOSIS — M25552 Pain in left hip: Secondary | ICD-10-CM | POA: Diagnosis not present

## 2022-07-23 DIAGNOSIS — M25562 Pain in left knee: Secondary | ICD-10-CM | POA: Diagnosis not present

## 2022-07-23 DIAGNOSIS — M48061 Spinal stenosis, lumbar region without neurogenic claudication: Secondary | ICD-10-CM | POA: Diagnosis not present

## 2022-07-23 DIAGNOSIS — M5416 Radiculopathy, lumbar region: Secondary | ICD-10-CM | POA: Diagnosis not present

## 2022-07-25 DIAGNOSIS — M5416 Radiculopathy, lumbar region: Secondary | ICD-10-CM | POA: Diagnosis not present

## 2022-08-16 ENCOUNTER — Other Ambulatory Visit (INDEPENDENT_AMBULATORY_CARE_PROVIDER_SITE_OTHER): Payer: Medicare HMO

## 2022-08-16 DIAGNOSIS — E038 Other specified hypothyroidism: Secondary | ICD-10-CM | POA: Diagnosis not present

## 2022-08-16 LAB — TSH: TSH: 0.6 u[IU]/mL (ref 0.35–5.50)

## 2022-08-19 ENCOUNTER — Encounter: Payer: Self-pay | Admitting: Internal Medicine

## 2022-08-19 NOTE — Progress Notes (Signed)
Subjective:    Patient ID: Emily [name], female    DOB: 1941-01-22, 82 y.o.   MRN: [id number]      HPI Gur[place] is here for  Chief Complaint  Patient presents with   Leg Pain     Left lumbar radiculopathy - started in August.  Has been seeing ortho - Dr Ethelene Hal, dr Ranell Patrick.  Had epidural x 1 - sept - no help, nerve block in November - improved pain in lower back.  Did not help pain in left leg. Was told by dr Ranell Patrick the left leg pain was IT band tightness.  Had PT but it did not help.  She was doig exercises for IT band and radiculopathy.   Having pain in lateral left upper leg from hip down to knee.  Using voltaren gel which does not help.   Not sleeping well due to pain.     After sitting for long periods can not get up - has pain in the lateral knee.  Has pain turning knee laterally.  Any time she puts pressure on the left knee she feels pain.  The pain is not constant.  The pain is daily.     Hypothyroid - had recent labs  - tsh normal   Medications and allergies reviewed with patient and updated if appropriate.  Current Outpatient Medications on File Prior to Visit  Medication Sig Dispense Refill   Acetaminophen (TYLENOL 8 HOUR ARTHRITIS PAIN PO) Take by mouth.     albuterol (VENTOLIN HFA) 108 (90 Base) MCG/ACT inhaler Inhale 2 puffs into the lungs every 6 (six) hours as needed for wheezing or shortness of breath. 8 g 0   atenolol (TENORMIN) 25 MG tablet TAKE 1 TABLET BY MOUTH EVERY DAY 90 tablet 1   benzonatate (TESSALON) 200 MG capsule Take 1 capsule (200 mg total) by mouth 3 (three) times daily as needed for cough. 30 capsule 11   Calcium-Vitamin D-Vitamin K 750-500-40 MG-UNT-MCG TABS Take 1 tablet by mouth 2 (two) times daily.     chlorpheniramine (CHLOR-TRIMETON) 4 MG tablet Take 4 mg by mouth 2 (two) times daily as needed for allergies.     cholecalciferol (VITAMIN D) 25 MCG (1000 UNIT) tablet Take 1,000 Units by mouth daily.     [employer]S ALLERGY RELIEF D 5-120 MG  tablet TAKE 1 TABLET BY MOUTH TWICE A DAY 48 tablet 14   EPINEPHrine 0.3 mg/0.3 mL IJ SOAJ injection Inject 0.3 mg into the muscle as needed for anaphylaxis. 1 each 0   ipratropium (ATROVENT) 0.03 % nasal spray SPRAY 2 SPRAYS IN EACH NOSTRIL EVERY 12 HOURS 90 mL 0   levothyroxine (SYNTHROID) 75 MCG tablet Take 1 tablet daily 30 minutes before breakfast daily 6 days a week and 1/2 tablet one day a week 90 tablet 3   triamcinolone (KENALOG) 0.025 % ointment Apply 1 application topically 2 (two) times daily. Use as needed. 30 g 1   No current facility-administered medications on file prior to visit.    Review of Systems     Objective:   Vitals:   08/21/22 0809  BP: 128/70  Pulse: 60  Temp: 97.9 F (36.6 C)  SpO2: 97%   BP Readings from Last 3 Encounters:  08/21/22 128/70  05/16/22 127/89  04/03/22 (!) 140/80   Wt Readings from Last 3 Encounters:  08/21/22 122 lb 6.4 oz (55.5 kg)  05/16/22 122 lb 3.2 oz (55.4 kg)  04/03/22 122 lb (55.3 kg)   Body mass  index is 23.13 kg/m.    Physical Exam Constitutional:      General: She is not in acute distress.    Appearance: Normal appearance. She is not ill-appearing.  HENT:     Head: Normocephalic and atraumatic.  Musculoskeletal:     Right lower leg: No edema.     Left lower leg: No edema.     Comments: No tenderness to palpation across lower back.  Mild tenderness lateral left hip, down the lateral aspect of upper leg and lateral aspect of left knee.  Full range of motion of hip and knee  Skin:    General: Skin is warm and dry.  Neurological:     Mental Status: She is alert.            Assessment & Plan:    See Problem List for Assessment and Plan of chronic medical problems.

## 2022-08-19 NOTE — Patient Instructions (Signed)
    Schedule an appointment with Dr Denyse Amass downstairs for your left leg pain.   A referral was ordered for sports medicine - Dr Denyse Amass.       Medications changes include :   none    Return for Physical Exam in march.

## 2022-08-21 ENCOUNTER — Ambulatory Visit (INDEPENDENT_AMBULATORY_CARE_PROVIDER_SITE_OTHER): Payer: Medicare HMO | Admitting: Internal Medicine

## 2022-08-21 VITALS — BP 128/70 | HR 60 | Temp 97.9°F | Ht 61.0 in | Wt 122.4 lb

## 2022-08-21 DIAGNOSIS — E038 Other specified hypothyroidism: Secondary | ICD-10-CM

## 2022-08-21 DIAGNOSIS — G8929 Other chronic pain: Secondary | ICD-10-CM | POA: Insufficient documentation

## 2022-08-21 DIAGNOSIS — R739 Hyperglycemia, unspecified: Secondary | ICD-10-CM

## 2022-08-21 DIAGNOSIS — M810 Age-related osteoporosis without current pathological fracture: Secondary | ICD-10-CM

## 2022-08-21 DIAGNOSIS — M25562 Pain in left knee: Secondary | ICD-10-CM

## 2022-08-21 DIAGNOSIS — E782 Mixed hyperlipidemia: Secondary | ICD-10-CM

## 2022-08-21 DIAGNOSIS — N1831 Chronic kidney disease, stage 3a: Secondary | ICD-10-CM

## 2022-08-21 NOTE — Assessment & Plan Note (Signed)
Chronic Recent TSH within normal range and clinically euthyroid Continue levothyroxine 75 mcg daily 6 days a week and 37.5 mg 1 day a week Will recheck TFTs prior to CPE in 2 months

## 2022-08-21 NOTE — Assessment & Plan Note (Signed)
Chronic Having pain from left hip down to right knee Earlier she was experiencing lumbar back pain it was thought that this was radiculopathy, but lower back pain is resolved with nerve ablation and still having left upper lateral leg pain-seems to be somewhat focused at the knee Discussed possible causes Has already seen orthopedics and they did not feel there is anything they can do to help Will refer to sports medicine for further evaluation and treatment

## 2022-08-22 NOTE — Progress Notes (Signed)
I, Peterson Lombard, LAT, ATC acting as a scribe for Lynne Leader, MD.  Emily [name] is a 82 y.o. female who presents to [place] at [facility] today for LBP. Pt was previously seen by Dr. Tamala Julian on 05/09/21 for neck pain. Today, pt c/o LBP ongoing since Aug. Patient locates pain to midline of her low back w/ radiating pain along the lateral aspect of the L thigh. Pt has a tender area over lateral aspect of the knee and deep to the patella. Pt c/o pain waking her up at night.   Radiating pain: yes LE numbness/tingling: yes- "pins" LE weakness: yes- slight Aggravates: walking, sleeping w/ knee flex, L-side lying Treatments tried: prior PT, lumbar ESI, nerve block (Oct-Nov), ice, heat, Tylenol, naproxen, Gabapentin  Pertinent review of systems: No fevers or chills  Relevant historical information: IBS.   Exam:  BP (!) 142/84   Pulse 62   Ht '5\' 1"'$  (1.549 m)   Wt 124 lb (56.2 kg)   SpO2 95%   BMI 23.43 kg/m  General: Well Developed, well nourished, and in no acute distress.   MSK: L-spine: Normal. Nontender to palpation spinal midline.  Normal lumbar motion.  Negative slump test left side. Lower extremity strength is intact except noted below. Reflexes are intact.  Left hip: Normal-appearing Tender palpation at greater trochanter.  Hip abduction and external rotation strength are diminished.  Both of these test produced pain in the lateral hip.    Lab and Radiology Results  Procedure: Real-time Ultrasound Guided Injection of left lateral hip greater trochanter bursa Device: Philips Affiniti 50G Images permanently stored and available for review in PACS Verbal informed consent obtained.  Discussed risks and benefits of procedure. Warned about infection, bleeding, hyperglycemia damage to structures among others. Patient expresses understanding and agreement Time-out conducted.   Noted no overlying erythema, induration, or other signs of local infection.    Skin prepped in a sterile fashion.   Local anesthesia: Topical Ethyl chloride.   With sterile technique and under real time ultrasound guidance: 40 mg of Kenalog and 2 mL of Marcaine injected into greater trochanteric bursa. Fluid seen entering the bursa.   Completed without difficulty   Pain immediately resolved suggesting accurate placement of the medication.   Advised to call if fevers/chills, erythema, induration, drainage, or persistent bleeding.   Images permanently stored and available for review in the ultrasound unit.  Impression: Technically successful ultrasound guided injection.    MRI lumbar spine images obtained at [facility] on June 30, 2022 Impression:  Severe left and mild right L5-S1 neuroforaminal narrowing with impingement upon exiting left L5 nerve roots.  Moderate right and mild-moderate left subarticular zone narrowing with mild impingement displacement of descending right S1 nerve roots and contact of descending left S1 nerve roots. Mild left L4-5 neuroforaminal narrowing and mild narrowing of subarticular zones without spinal canal stenosis. Mild bilateral L2-3 neuroforaminal narrowing and mild narrowing of subarticular zones more pronounced on the left without spinal canal stenosis. Mild levoconvex lumbar scoliosis.  Degenerative minimal anterior listhesis of L4 on L5.  Minimal mild multilevel retrolisthesis  MRI report will be sent to scan.      Assessment and Plan: 82 y.o. female with left lateral hip pain associated with low back pain and pain radiating down the left leg.  Her pain is multifactorial and complicated.  I believe her pain is due to lumbar radiculopathy at L5, axial back pain from facet DJD and muscle spasm and dysfunction  and predominantly due to trochanteric bursitis/hip abductor tendinopathy.  Plan for greater trochanter bursa injection today and referral back to physical therapy primarily for hip abduction strengthening and core  strengthening. She is already had 2 back injections including an epidural steroid injection and a nerve root block.  The nerve root block had some benefit.  The epidural steroid injection did not.  We could pursue further injections if needed.  Check back in 6 weeks.  Additionally recommend gabapentin especially at bedtime.   PDMP not reviewed this encounter. Orders Placed This Encounter  Procedures   [place] LIMITED JOINT SPACE STRUCTURES LOW LEFT(NO LINKED CHARGES)    Order Specific Question:   Reason for Exam (SYMPTOM  OR DIAGNOSIS REQUIRED)    Answer:   Left knee pain    Order Specific Question:   Preferred imaging location?    Answer:   [place]   Ambulatory referral to Physical Therapy    Referral Priority:   Routine    Referral Type:   Physical Medicine    Referral Reason:   Specialty Services Required    Requested Specialty:   Physical Therapy    Number of Visits Requested:   1   Meds ordered this encounter  Medications   gabapentin (NEURONTIN) 100 MG capsule    Sig: Take 1 capsule (100 mg total) by mouth at bedtime. Take 100-'300mg'$  at bedtime    Dispense:  30 capsule    Refill:  3     Discussed warning signs or symptoms. Please see discharge instructions. Patient expresses understanding.   The above documentation has been reviewed and is accurate and complete Lynne Leader, M.D.

## 2022-08-27 ENCOUNTER — Ambulatory Visit: Payer: Self-pay

## 2022-08-27 ENCOUNTER — Ambulatory Visit: Payer: Medicare HMO | Admitting: Family Medicine

## 2022-08-27 VITALS — BP 142/84 | HR 62 | Ht 61.0 in | Wt 124.0 lb

## 2022-08-27 DIAGNOSIS — M5442 Lumbago with sciatica, left side: Secondary | ICD-10-CM

## 2022-08-27 DIAGNOSIS — M7062 Trochanteric bursitis, left hip: Secondary | ICD-10-CM | POA: Diagnosis not present

## 2022-08-27 DIAGNOSIS — G8929 Other chronic pain: Secondary | ICD-10-CM

## 2022-08-27 MED ORDER — GABAPENTIN 100 MG PO CAPS: 100.0000 mg | ORAL_CAPSULE | Freq: Every day | ORAL | 3 refills | Status: DC

## 2022-08-27 NOTE — Patient Instructions (Addendum)
Thank you for coming in today.   You received an injection today. Seek immediate medical attention if the joint becomes red, extremely painful, or is oozing fluid.   I've sent a prescription for Gabapentin to your pharmacy.   I've referred you to Physical Therapy.  Let us know if you don't hear from them in one week.   Check back in 6 weeks

## 2022-08-29 ENCOUNTER — Telehealth: Payer: Self-pay | Admitting: Family Medicine

## 2022-08-29 NOTE — Telephone Encounter (Signed)
Pt called again about this message as she is "unable to walk". Pt informed Dr. Georgina Snell is in clinic.

## 2022-08-29 NOTE — Telephone Encounter (Signed)
Patient called stating that she was here to see Dr Georgina Snell on 08/27/2022 and had an injection. She is having a lot of pain that goes from where the injection was down to her knee and she is having trouble bending her knee to walk.  The first day after the injection and yesterday she was fine until last night. The pain started around 730pm. She did take two Gabapentin last night and was able to sleep until about 3am this morning but the pain is still there. She also mentioned that she is feeling flush in her face and Dr Georgina Snell said he might be able to prescribe a medication for that?  She asked if Dr Georgina Snell could call her to discuss.  [phone]

## 2022-08-30 NOTE — Telephone Encounter (Signed)
She called back. She is feeling a little better with less pain.  Plan for tentative appt Monday that she cancel if feeling better. Precautions reviewed.

## 2022-08-30 NOTE — Telephone Encounter (Signed)
Called and left a messasge. Will try calling back

## 2022-09-02 ENCOUNTER — Ambulatory Visit: Payer: Medicare HMO | Admitting: Family Medicine

## 2022-09-03 ENCOUNTER — Encounter: Payer: Self-pay | Admitting: Physical Therapy

## 2022-09-03 ENCOUNTER — Ambulatory Visit: Payer: Medicare HMO | Admitting: Physical Therapy

## 2022-09-03 DIAGNOSIS — M25552 Pain in left hip: Secondary | ICD-10-CM | POA: Diagnosis not present

## 2022-09-03 DIAGNOSIS — M5459 Other low back pain: Secondary | ICD-10-CM | POA: Diagnosis not present

## 2022-09-03 NOTE — Therapy (Signed)
OUTPATIENT PHYSICAL THERAPY THORACOLUMBAR EVALUATION   Patient Name: Emily [name] MRN: [id number] DOB:1941-06-12, 82 y.o., female Today's Date: 09/03/2022  END OF SESSION:  PT End of Session - 09/04/22 1846     Visit Number 1    Number of Visits 16    Date for PT Re-Evaluation 10/29/22    Authorization Type [employer] Medicare    PT Start Time 0935    PT Stop Time 4268    PT Time Calculation (min) 40 min    Activity Tolerance Patient tolerated treatment well    Behavior During Therapy WFL for tasks assessed/performed             Past Medical History:  Diagnosis Date   Actinic keratosis    Allergic rhinitis    BACK PAIN    CARCINOMA, BREAST, 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   L s/p lumpectomy, chemo and XRT   Cervicalgia    Cholelithiasis    DIVERTICULOSIS, COLON    ENDOMETRIOSIS    GERD (gastroesophageal reflux disease)    Heart palpitations    Hypertension    HYPOTHYROIDISM    Left wrist fracture 11/06/2016   10/2016 - fell of high stool   Osteoporosis    Personal history of chemotherapy    Personal history of radiation therapy    Pleural plaque without asbestos    CT chest 10/2010: R>L lobes, upper> lower   Squamous cell carcinoma    History of BC   Past Surgical History:  Procedure Laterality Date   BREAST LUMPECTOMY  2001   left   CATARACT EXTRACTION Bilateral 09/2014   both eyes   CHOLECYSTECTOMY  2010   ENDOMETRIAL ABLATION     EXCISION / BIOPSY BREAST / NIPPLE / DUCT Left 2001   Patient Active Problem List   Diagnosis Date Noted   Chronic pain of left knee 08/21/2022   Lumbar radiculopathy 03/25/2022   Cough 12/27/2021   Sleep difficulties 09/27/2021   Cervicogenic headache 04/10/2021   Acute bronchitis 04/10/2021   Elevated blood pressure reading 04/10/2021   Skin abnormality 02/27/2021   Aortic atherosclerosis ([place]) 09/22/2020   Urinary urgency 03/23/2020   CKD (chronic kidney disease) stage 3, GFR 30-59 ml/min (HCC) 03/22/2020    Hyperglycemia 03/22/2020   Change in stool 11/17/2019   Right-sided chest wall pain 11/17/2019   Urinary retention 09/23/2019   SOB (shortness of breath) 01/15/2019   Upper airway cough syndrome 01/15/2019   Belching 12/14/2018   Sinus infection 12/14/2018   Reactive airway disease 12/05/2018   Tear of left rotator cuff 10/05/2018   Pre-syncope 05/19/2018   DDD (degenerative disc disease), cervical 01/15/2018   Neuralgia 09/12/2017   Presbycusis of both ears 12/11/2016   Memory difficulties 11/06/2016   IBS (irritable bowel syndrome) 04/16/2016   Hyperlipidemia 09/10/2015   Osteoporosis 09/08/2015   Palpitations    Pleural plaque without asbestos 12/03/2010   Chronic neck pain 12/18/2009   DIVERTICULOSIS, COLON 03/02/2009   Lumbago 03/11/2008   Actinic keratosis 01/23/2008   CARCINOMA, BREAST, ESTROGEN RECEPTOR NEGATIVE 10/12/2007   Hypothyroidism 06/18/2007   Allergic rhinitis 06/11/2007    PCP: Billey Gosling  REFERRING PROVIDER: Lynne Leader   REFERRING DIAG:   Rationale for Evaluation and Treatment: Rehabilitation  THERAPY DIAG:  Other low back pain  Pain in left hip  ONSET DATE:   SUBJECTIVE:  SUBJECTIVE STATEMENT:  Eval:  Pt states pain in L leg since August.  Had epidural in spine L5, no help. In September. Also had nerve block. Helped back pain, but still has leg pain.  Also has seen Ortho, now with IT band pain.  Now seeing sports met. She also had coristone shot in hip 1/9.  Having to use cane at times for pain, was using walker when it was really bad.  Can't put pressure on leg. Feels better at night, sleeping through night. Feels back to baseline today after hip injection.  Did not have much PT for this yet.  Most pain with standing/walking and with initial standing.    PERTINENT HISTORY:   PAIN:  Are you having pain? Yes: NPRS scale: 3/10 Pain location: L Leg Pain description: sore, very painful Aggravating factors: first thing in AM,  Relieving factors: none stated  PRECAUTIONS: None  WEIGHT BEARING RESTRICTIONS: No  FALLS:  Has patient fallen in last 6 months? No   OCCUPATION:   PLOF: Independent  PATIENT GOALS: decreased pain in leg.    OBJECTIVE:   DIAGNOSTIC FINDINGS:    PATIENT SURVEYS:  FOTO [phone]   COGNITION: Overall cognitive status: Within functional limits for tasks assessed     SENSATION: WFL   POSTURE:   PALPATION: Tenderness in distal ITB, gr troch, into glute.    LUMBAR ROM:   AROM eval  Flexion wfl  Extension Mod limitation  Right lateral flexion Mild limitation  Left lateral flexion Mild limitation  Right rotation   Left rotation    (Blank rows = not tested)  LOWER EXTREMITY MMT   MMT Right eval Left eval  Hip flexion 4 4-  Hip extension    Hip abduction 4 4-  Hip adduction    Hip internal rotation    Hip external rotation    Knee flexion  4+  Knee extension  4+  Ankle dorsiflexion    Ankle plantarflexion    Ankle inversion    Ankle eversion     (Blank rows = not tested)   LUMBAR SPECIAL TESTS:    TODAY'S TREATMENT:                                                                                                                              DATE:   09/03/22:  Ther ex: see below for HEP,  Modalities: ionto with dexamethasone, 4 hr patch to L distal ITB  PATIENT EDUCATION:  Education details: PT POC, Exam findings, HEP Person educated: Patient Education method: Explanation, Demonstration, Tactile cues, Verbal cues, and Handouts Education comprehension: verbalized understanding, returned demonstration, verbal cues required, tactile cues required, and needs further education  HOME EXERCISE PROGRAM:  Access Code: [email or web address] URL: [email or web address] Date:  09/04/2022 Prepared by: Lyndee Hensen  Exercises - Supine Single Knee to Chest  - 2 x daily - 3 reps - 30 hold - Supine Piriformis Stretch Pulling Heel to Hip  - 2  x daily - 3 reps - 30 hold - Supine Lower Trunk Rotation  - 2 x daily - 10 reps - 5 hold  ASSESSMENT:  CLINICAL IMPRESSION: Patient presents with primary complaint of increased pain in L leg and hip. She has had ongoing deficits in back as well. She has soreness in gr trochanter, glute, and at distal ITB. She has weakness in hip and has had ongoing pain since August. She has decreased ability for sustaining standing activity, walking, and functional activity. Pt to benefit from skilled PT to improve deficits and pain.   OBJECTIVE IMPAIRMENTS: Abnormal gait, decreased activity tolerance, decreased knowledge of use of DME, decreased mobility, difficulty walking, decreased ROM, decreased strength, increased muscle spasms, improper body mechanics, and pain.   ACTIVITY LIMITATIONS: carrying, lifting, bending, standing, squatting, stairs, transfers, and locomotion level  PARTICIPATION LIMITATIONS: cleaning, laundry, shopping, and community activity  PERSONAL FACTORS: Time since onset of injury/illness/exacerbation are also affecting patient's functional outcome.   REHAB POTENTIAL: Good  CLINICAL DECISION MAKING: Stable/uncomplicated  EVALUATION COMPLEXITY: Low   GOALS: Goals reviewed with patient? Yes  SHORT TERM GOALS: Target date: 09/17/2022    Pt to be independent with initial HEP  Goal status: INITIAL  2.  Pt to report decreased pain in ITB to 0-3/10  Goal status: INITIAL   LONG TERM GOALS: Target date: 10/29/2022  Pt to be independent with final HEP   Goal status: INITIAL  2.  Pt to report decreased pain in hip and LE to 0-3/10 with standing activity.   Goal status: INITIAL  3.  Pt to demo increased strength of L hip and LE to at least 4+/5 to improve ability for stairs and gait.   Goal status:  INITIAL  4.  Pt to report ability for standing activity for at least 30 min with pain 0-3/10.    Goal status: INITIAL    PLAN:  PT FREQUENCY: 1-2x/week  PT DURATION: 8 weeks  PLANNED INTERVENTIONS: Therapeutic exercises, Therapeutic activity, Neuromuscular re-education, Balance training, Gait training, Patient/Family education, Self Care, Joint mobilization, Joint manipulation, Stair training, DME instructions, Aquatic Therapy, Dry Needling, Electrical stimulation, Spinal manipulation, Spinal mobilization, Cryotherapy, Moist heat, Taping, Traction, Ultrasound, Ionotophoresis '4mg'$ /ml Dexamethasone, and Manual therapy.  PLAN FOR NEXT SESSION:    Lyndee Hensen, PT, DPT 8:14 PM  09/04/22

## 2022-09-04 ENCOUNTER — Encounter: Payer: Self-pay | Admitting: Physical Therapy

## 2022-09-05 ENCOUNTER — Encounter: Payer: Self-pay | Admitting: Physical Therapy

## 2022-09-05 ENCOUNTER — Ambulatory Visit: Payer: Medicare HMO | Admitting: Physical Therapy

## 2022-09-05 DIAGNOSIS — M25571 Pain in right ankle and joints of right foot: Secondary | ICD-10-CM

## 2022-09-05 DIAGNOSIS — M25552 Pain in left hip: Secondary | ICD-10-CM | POA: Diagnosis not present

## 2022-09-05 DIAGNOSIS — M5459 Other low back pain: Secondary | ICD-10-CM | POA: Diagnosis not present

## 2022-09-05 NOTE — Therapy (Signed)
OUTPATIENT PHYSICAL THERAPY THORACOLUMBAR TREATMENT   Patient Name: Emily [name] MRN: [id number] DOB:03/12/1941, 82 y.o., female Today's Date: 09/05/2022  END OF SESSION:  PT End of Session - 09/05/22 1208     Visit Number 2    Number of Visits 16    Date for PT Re-Evaluation 10/29/22    Authorization Type [employer]na Medicare    PT Start Time 1104    PT Stop Time 7902    PT Time Calculation (min) 41 min    Activity Tolerance Patient tolerated treatment well    Behavior During Therapy WFL for tasks assessed/performed              Past Medical History:  Diagnosis Date   Actinic keratosis    Allergic rhinitis    BACK PAIN    CARCINOMA, BREAST, 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   L s/p lumpectomy, chemo and XRT   Cervicalgia    Cholelithiasis    DIVERTICULOSIS, COLON    ENDOMETRIOSIS    GERD (gastroesophageal reflux disease)    Heart palpitations    Hypertension    HYPOTHYROIDISM    Left wrist fracture 11/06/2016   10/2016 - fell of high stool   Osteoporosis    Personal history of chemotherapy    Personal history of radiation therapy    Pleural plaque without asbestos    CT chest 10/2010: R>L lobes, upper> lower   Squamous cell carcinoma    History of BC   Past Surgical History:  Procedure Laterality Date   BREAST LUMPECTOMY  2001   left   CATARACT EXTRACTION Bilateral 09/2014   both eyes   CHOLECYSTECTOMY  2010   ENDOMETRIAL ABLATION     EXCISION / BIOPSY BREAST / NIPPLE / DUCT Left 2001   Patient Active Problem List   Diagnosis Date Noted   Chronic pain of left knee 08/21/2022   Lumbar radiculopathy 03/25/2022   Cough 12/27/2021   Sleep difficulties 09/27/2021   Cervicogenic headache 04/10/2021   Acute bronchitis 04/10/2021   Elevated blood pressure reading 04/10/2021   Skin abnormality 02/27/2021   Aortic atherosclerosis ([place]) 09/22/2020   Urinary urgency 03/23/2020   CKD (chronic kidney disease) stage 3, GFR 30-59 ml/min (HCC) 03/22/2020    Hyperglycemia 03/22/2020   Change in stool 11/17/2019   Right-sided chest wall pain 11/17/2019   Urinary retention 09/23/2019   SOB (shortness of breath) 01/15/2019   Upper airway cough syndrome 01/15/2019   Belching 12/14/2018   Sinus infection 12/14/2018   Reactive airway disease 12/05/2018   Tear of left rotator cuff 10/05/2018   Pre-syncope 05/19/2018   DDD (degenerative disc disease), cervical 01/15/2018   Neuralgia 09/12/2017   Presbycusis of both ears 12/11/2016   Memory difficulties 11/06/2016   IBS (irritable bowel syndrome) 04/16/2016   Hyperlipidemia 09/10/2015   Osteoporosis 09/08/2015   Palpitations    Pleural plaque without asbestos 12/03/2010   Chronic neck pain 12/18/2009   DIVERTICULOSIS, COLON 03/02/2009   Lumbago 03/11/2008   Actinic keratosis 01/23/2008   CARCINOMA, BREAST, ESTROGEN RECEPTOR NEGATIVE 10/12/2007   Hypothyroidism 06/18/2007   Allergic rhinitis 06/11/2007    PCP: Billey Gosling  REFERRING PROVIDER: Lynne Leader   REFERRING DIAG:   Rationale for Evaluation and Treatment: Rehabilitation  THERAPY DIAG:  Other low back pain  Pain in right ankle and joints of right foot  Pain in left hip  ONSET DATE:   SUBJECTIVE:  SUBJECTIVE STATEMENT: 09/05/2022 Pt with less pain in hip and lateral knee today. Tolerated ionto patch well.     Eval:  Pt states pain in L leg since August.  Had epidural in spine L5, no help. In September. Also had nerve block. Helped back pain, but still has leg pain.  Also has seen Ortho, now with IT band pain.  Now seeing sports met. She also had coristone shot in hip 1/9.  Having to use cane at times for pain, was using walker when it was really bad.  Can't put pressure on leg. Feels better at night, sleeping through night. Feels back to  baseline today after hip injection.  Did not have much PT for this yet.  Most pain with standing/walking and with initial standing.   PERTINENT HISTORY:   PAIN:  Are you having pain? Yes: NPRS scale: 3/10 Pain location: L Leg Pain description: sore, very painful Aggravating factors: first thing in AM,  Relieving factors: none stated  PRECAUTIONS: None  WEIGHT BEARING RESTRICTIONS: No  FALLS:  Has patient fallen in last 6 months? No   OCCUPATION:   PLOF: Independent  PATIENT GOALS: decreased pain in leg.    OBJECTIVE:   DIAGNOSTIC FINDINGS:    PATIENT SURVEYS:  FOTO 76   COGNITION: Overall cognitive status: Within functional limits for tasks assessed     SENSATION: WFL   POSTURE:   PALPATION: Tenderness in distal ITB, gr troch, into glute.    LUMBAR ROM:   AROM eval  Flexion wfl  Extension Mod limitation  Right lateral flexion Mild limitation  Left lateral flexion Mild limitation  Right rotation   Left rotation    (Blank rows = not tested)  LOWER EXTREMITY MMT   MMT Right eval Left eval  Hip flexion 4 4-  Hip extension    Hip abduction 4 4-  Hip adduction    Hip internal rotation    Hip external rotation    Knee flexion  4+  Knee extension  4+  Ankle dorsiflexion    Ankle plantarflexion    Ankle inversion    Ankle eversion     (Blank rows = not tested)   LUMBAR SPECIAL TESTS:    TODAY'S TREATMENT:                                                                                                                              DATE:   09/05/22: Therapeutic Exercise: Aerobic: Supine:  Clams Blue TB x 15; bridging 2 x10 ;  Seated: S/L:  Hip abd 2 x 10;  Clams 2 x 10;  Standing:  Stretches:  piriformis modified fig 4, x 2 mil bil;   LTR x 15;  Neuromuscular Re-education: Manual Therapy: long leg distraction to L hip; manual hip IR/ER, and ITB stretches.  Modalities: ionto with dexamethasone, 4 hr patch to L distal ITB   09/03/22:   Ther ex: see below for HEP,  Modalities:  ionto with dexamethasone, 4 hr patch to L distal ITB    PATIENT EDUCATION:  Education details: PT POC, Exam findings, HEP Person educated: Patient Education method: Explanation, Demonstration, Tactile cues, Verbal cues, and Handouts Education comprehension: verbalized understanding, returned demonstration, verbal cues required, tactile cues required, and needs further education  HOME EXERCISE PROGRAM Access Code: [email or web address]   ASSESSMENT:  CLINICAL IMPRESSION: 09/05/2022 Pt with improved soreness today since having injection in hip. Still has tenderness in distal ITB. Able to tolerate STM/TPR to glute, but not to ITB. Continued use of ionto patch due to positive response last visit. Pt with good tolerance for starting hip strengthening today.   Eval: Patient presents with primary complaint of increased pain in L leg and hip. She has had ongoing deficits in back as well. She has soreness in gr trochanter, glute, and at distal ITB. She has weakness in hip and has had ongoing pain since August. She has decreased ability for sustaining standing activity, walking, and functional activity. Pt to benefit from skilled PT to improve deficits and pain.   OBJECTIVE IMPAIRMENTS: Abnormal gait, decreased activity tolerance, decreased knowledge of use of DME, decreased mobility, difficulty walking, decreased ROM, decreased strength, increased muscle spasms, improper body mechanics, and pain.   ACTIVITY LIMITATIONS: carrying, lifting, bending, standing, squatting, stairs, transfers, and locomotion level  PARTICIPATION LIMITATIONS: cleaning, laundry, shopping, and community activity  PERSONAL FACTORS: Time since onset of injury/illness/exacerbation are also affecting patient's functional outcome.   REHAB POTENTIAL: Good  CLINICAL DECISION MAKING: Stable/uncomplicated  EVALUATION COMPLEXITY: Low   GOALS: Goals reviewed with patient? Yes  SHORT TERM GOALS:  Target date: 09/17/2022    Pt to be independent with initial HEP  Goal status: INITIAL  2.  Pt to report decreased pain in ITB to 0-3/10  Goal status: INITIAL   LONG TERM GOALS: Target date: 10/29/2022  Pt to be independent with final HEP   Goal status: INITIAL  2.  Pt to report decreased pain in hip and LE to 0-3/10 with standing activity.   Goal status: INITIAL  3.  Pt to demo increased strength of L hip and LE to at least 4+/5 to improve ability for stairs and gait.   Goal status: INITIAL  4.  Pt to report ability for standing activity for at least 30 min with pain 0-3/10.    Goal status: INITIAL    PLAN:  PT FREQUENCY: 1-2x/week  PT DURATION: 8 weeks  PLANNED INTERVENTIONS: Therapeutic exercises, Therapeutic activity, Neuromuscular re-education, Balance training, Gait training, Patient/Family education, Self Care, Joint mobilization, Joint manipulation, Stair training, DME instructions, Aquatic Therapy, Dry Needling, Electrical stimulation, Spinal manipulation, Spinal mobilization, Cryotherapy, Moist heat, Taping, Traction, Ultrasound, Ionotophoresis '4mg'$ /ml Dexamethasone, and Manual therapy.  PLAN FOR NEXT SESSION:    Lyndee Hensen, PT, DPT 1:24 PM  09/05/22

## 2022-09-09 ENCOUNTER — Ambulatory Visit: Payer: Medicare HMO | Admitting: Physical Therapy

## 2022-09-09 ENCOUNTER — Encounter: Payer: Self-pay | Admitting: Physical Therapy

## 2022-09-09 DIAGNOSIS — M5459 Other low back pain: Secondary | ICD-10-CM

## 2022-09-09 DIAGNOSIS — M25552 Pain in left hip: Secondary | ICD-10-CM | POA: Diagnosis not present

## 2022-09-09 DIAGNOSIS — M25571 Pain in right ankle and joints of right foot: Secondary | ICD-10-CM | POA: Diagnosis not present

## 2022-09-09 DIAGNOSIS — R262 Difficulty in walking, not elsewhere classified: Secondary | ICD-10-CM | POA: Diagnosis not present

## 2022-09-09 NOTE — Therapy (Signed)
OUTPATIENT PHYSICAL THERAPY THORACOLUMBAR TREATMENT   Patient Name: Emily [name] MRN: [id number] DOB:1940-09-02, 82 y.o., female Today's Date: 09/09/2022  END OF SESSION:  PT End of Session - 09/09/22 1226     Visit Number 3    Number of Visits 16    Date for PT Re-Evaluation 10/29/22    Authorization Type [employer]    PT Start Time 3149    PT Stop Time 1302    PT Time Calculation (min) 42 min    Activity Tolerance Patient tolerated treatment well    Behavior During Therapy WFL for tasks assessed/performed               Past Medical History:  Diagnosis Date   Actinic keratosis    Allergic rhinitis    BACK PAIN    CARCINOMA, BREAST, 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   L s/p lumpectomy, chemo and XRT   Cervicalgia    Cholelithiasis    DIVERTICULOSIS, COLON    ENDOMETRIOSIS    GERD (gastroesophageal reflux disease)    Heart palpitations    Hypertension    HYPOTHYROIDISM    Left wrist fracture 11/06/2016   10/2016 - fell of high stool   Osteoporosis    Personal history of chemotherapy    Personal history of radiation therapy    Pleural plaque without asbestos    CT chest 10/2010: R>L lobes, upper> lower   Squamous cell carcinoma    History of BC   Past Surgical History:  Procedure Laterality Date   BREAST LUMPECTOMY  2001   left   CATARACT EXTRACTION Bilateral 09/2014   both eyes   CHOLECYSTECTOMY  2010   ENDOMETRIAL ABLATION     EXCISION / BIOPSY BREAST / NIPPLE / DUCT Left 2001   Patient Active Problem List   Diagnosis Date Noted   Chronic pain of left knee 08/21/2022   Lumbar radiculopathy 03/25/2022   Cough 12/27/2021   Sleep difficulties 09/27/2021   Cervicogenic headache 04/10/2021   Acute bronchitis 04/10/2021   Elevated blood pressure reading 04/10/2021   Skin abnormality 02/27/2021   Aortic atherosclerosis ([place]) 09/22/2020   Urinary urgency 03/23/2020   CKD (chronic kidney disease) stage 3, GFR 30-59 ml/min (HCC) 03/22/2020    Hyperglycemia 03/22/2020   Change in stool 11/17/2019   Right-sided chest wall pain 11/17/2019   Urinary retention 09/23/2019   SOB (shortness of breath) 01/15/2019   Upper airway cough syndrome 01/15/2019   Belching 12/14/2018   Sinus infection 12/14/2018   Reactive airway disease 12/05/2018   Tear of left rotator cuff 10/05/2018   Pre-syncope 05/19/2018   DDD (degenerative disc disease), cervical 01/15/2018   Neuralgia 09/12/2017   Presbycusis of both ears 12/11/2016   Memory difficulties 11/06/2016   IBS (irritable bowel syndrome) 04/16/2016   Hyperlipidemia 09/10/2015   Osteoporosis 09/08/2015   Palpitations    Pleural plaque without asbestos 12/03/2010   Chronic neck pain 12/18/2009   DIVERTICULOSIS, COLON 03/02/2009   Lumbago 03/11/2008   Actinic keratosis 01/23/2008   CARCINOMA, BREAST, ESTROGEN RECEPTOR NEGATIVE 10/12/2007   Hypothyroidism 06/18/2007   Allergic rhinitis 06/11/2007    PCP: Billey Gosling  REFERRING PROVIDER: Lynne Leader   REFERRING DIAG:   Rationale for Evaluation and Treatment: Rehabilitation  THERAPY DIAG:  Other low back pain  Pain in right ankle and joints of right foot  Pain in left hip  Difficulty in walking, not elsewhere classified  ONSET DATE:   SUBJECTIVE:  SUBJECTIVE STATEMENT: 09/09/2022  Pt with less pain in leg. Has been doing HEP     Eval:  Pt states pain in L leg since August.  Had epidural in spine L5, no help. In September. Also had nerve block. Helped back pain, but still has leg pain.  Also has seen Ortho, now with IT band pain.  Now seeing sports met. She also had coristone shot in hip 1/9.  Having to use cane at times for pain, was using walker when it was really bad.  Can't put pressure on leg. Feels better at night, sleeping through  night. Feels back to baseline today after hip injection.  Did not have much PT for this yet.  Most pain with standing/walking and with initial standing.   PERTINENT HISTORY:   PAIN:  Are you having pain? Yes: NPRS scale: 3/10 Pain location: L Leg Pain description: sore, very painful Aggravating factors: first thing in AM,  Relieving factors: none stated  PRECAUTIONS: None  WEIGHT BEARING RESTRICTIONS: No  FALLS:  Has patient fallen in last 6 months? No   OCCUPATION:   PLOF: Independent  PATIENT GOALS: decreased pain in leg.    OBJECTIVE:   DIAGNOSTIC FINDINGS:    PATIENT SURVEYS:  FOTO [phone]   COGNITION: Overall cognitive status: Within functional limits for tasks assessed     SENSATION: WFL   POSTURE:   PALPATION: Tenderness in distal ITB, gr troch, into glute.    LUMBAR ROM:   AROM eval  Flexion wfl  Extension Mod limitation  Right lateral flexion Mild limitation  Left lateral flexion Mild limitation  Right rotation   Left rotation    (Blank rows = not tested)  LOWER EXTREMITY MMT   MMT Right eval Left eval  Hip flexion 4 4-  Hip extension    Hip abduction 4 4-  Hip adduction    Hip internal rotation    Hip external rotation    Knee flexion  4+  Knee extension  4+  Ankle dorsiflexion    Ankle plantarflexion    Ankle inversion    Ankle eversion     (Blank rows = not tested)   LUMBAR SPECIAL TESTS:    TODAY'S TREATMENT:                                                                                                                              DATE:   09/09/22: Therapeutic Exercise: Aerobic: Supine:  Clams Blue TB 2x10 with TA;   bridging 2 x 10 ; SLR 2 x 10 with TA; TA with education on correct performance 2 x 10;  Seated: S/L:  Hip abd 2 x 10;  Standing:  Stretches:  Seated piriformis 30 sec x 2 mil bil;   LTR x 15; DKTC x 3 in between core strength;  Neuromuscular Re-education: Manual Therapy: long leg distraction to L hip;  manual hip IR/ER, and ITB stretches. Roller to L ITB and glute  Modalities: ionto with dexamethasone, 4 hr patch to L distal ITB   09/05/22: Therapeutic Exercise: Aerobic: Supine:  Clams Blue TB x 15; bridging 2 x10 ;  Seated: S/L:  Hip abd 2 x 10;  Clams 2 x 10;  Standing:  Stretches:  piriformis modified fig 4, x 2 mil bil;   LTR x 15;  Neuromuscular Re-education: Manual Therapy: long leg distraction to L hip; manual hip IR/ER, and ITB stretches.  Modalities: ionto with dexamethasone, 4 hr patch to L distal ITB   09/03/22:  Ther ex: see below for HEP,  Modalities: ionto with dexamethasone, 4 hr patch to L distal ITB   PATIENT EDUCATION:  Education details: PT POC, Exam findings, HEP Person educated: Patient Education method: Explanation, Demonstration, Tactile cues, Verbal cues, and Handouts Education comprehension: verbalized understanding, returned demonstration, verbal cues required, tactile cues required, and needs further education  HOME EXERCISE PROGRAM Access Code: [email or web address]   ASSESSMENT:  CLINICAL IMPRESSION: 09/09/2022  Pt with improving pain with standing, walking, and activity. She still has much tenderness with palpation to proximal and distal ITB as well as gr troch and glute. Improving with ability for strengthening, and improved ability for achieving TA contraction after education and practice today. Plan to progress to standing strengthening next visit as able, and will benefit from continued manual as tolerated.   Eval: Patient presents with primary complaint of increased pain in L leg and hip. She has had ongoing deficits in back as well. She has soreness in gr trochanter, glute, and at distal ITB. She has weakness in hip and has had ongoing pain since August. She has decreased ability for sustaining standing activity, walking, and functional activity. Pt to benefit from skilled PT to improve deficits and pain.   OBJECTIVE IMPAIRMENTS: Abnormal gait, decreased  activity tolerance, decreased knowledge of use of DME, decreased mobility, difficulty walking, decreased ROM, decreased strength, increased muscle spasms, improper body mechanics, and pain.   ACTIVITY LIMITATIONS: carrying, lifting, bending, standing, squatting, stairs, transfers, and locomotion level  PARTICIPATION LIMITATIONS: cleaning, laundry, shopping, and community activity  PERSONAL FACTORS: Time since onset of injury/illness/exacerbation are also affecting patient's functional outcome.   REHAB POTENTIAL: Good  CLINICAL DECISION MAKING: Stable/uncomplicated  EVALUATION COMPLEXITY: Low   GOALS: Goals reviewed with patient? Yes  SHORT TERM GOALS: Target date: 09/17/2022    Pt to be independent with initial HEP  Goal status: INITIAL  2.  Pt to report decreased pain in ITB to 0-3/10  Goal status: INITIAL   LONG TERM GOALS: Target date: 10/29/2022  Pt to be independent with final HEP   Goal status: INITIAL  2.  Pt to report decreased pain in hip and LE to 0-3/10 with standing activity.   Goal status: INITIAL  3.  Pt to demo increased strength of L hip and LE to at least 4+/5 to improve ability for stairs and gait.   Goal status: INITIAL  4.  Pt to report ability for standing activity for at least 30 min with pain 0-3/10.    Goal status: INITIAL    PLAN:  PT FREQUENCY: 1-2x/week  PT DURATION: 8 weeks  PLANNED INTERVENTIONS: Therapeutic exercises, Therapeutic activity, Neuromuscular re-education, Balance training, Gait training, Patient/Family education, Self Care, Joint mobilization, Joint manipulation, Stair training, DME instructions, Aquatic Therapy, Dry Needling, Electrical stimulation, Spinal manipulation, Spinal mobilization, Cryotherapy, Moist heat, Taping, Traction, Ultrasound, Ionotophoresis '4mg'$ /ml Dexamethasone, and Manual therapy.  PLAN FOR NEXT SESSION:    Lyndee Hensen, PT, DPT 1:02 PM  09/09/22 

## 2022-09-12 ENCOUNTER — Encounter: Payer: Self-pay | Admitting: Physical Therapy

## 2022-09-12 ENCOUNTER — Ambulatory Visit: Payer: Medicare HMO | Admitting: Physical Therapy

## 2022-09-12 DIAGNOSIS — M5459 Other low back pain: Secondary | ICD-10-CM

## 2022-09-12 NOTE — Therapy (Signed)
OUTPATIENT PHYSICAL THERAPY THORACOLUMBAR TREATMENT   Patient Name: Emily [name] MRN: [id number] DOB:08/10/1941, 82 y.o., female Today's Date: 09/12/2022  END OF SESSION:  PT End of Session - 09/12/22 0805     Visit Number 4    Number of Visits 16    Date for PT Re-Evaluation 10/29/22    Authorization Type [employer]    PT Start Time 0805    PT Stop Time 0845    PT Time Calculation (min) 40 min    Activity Tolerance Patient tolerated treatment well    Behavior During Therapy WFL for tasks assessed/performed               Past Medical History:  Diagnosis Date   Actinic keratosis    Allergic rhinitis    BACK PAIN    CARCINOMA, BREAST, 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   L s/p lumpectomy, chemo and XRT   Cervicalgia    Cholelithiasis    DIVERTICULOSIS, COLON    ENDOMETRIOSIS    GERD (gastroesophageal reflux disease)    Heart palpitations    Hypertension    HYPOTHYROIDISM    Left wrist fracture 11/06/2016   10/2016 - fell of high stool   Osteoporosis    Personal history of chemotherapy    Personal history of radiation therapy    Pleural plaque without asbestos    CT chest 10/2010: R>L lobes, upper> lower   Squamous cell carcinoma    History of BC   Past Surgical History:  Procedure Laterality Date   BREAST LUMPECTOMY  2001   left   CATARACT EXTRACTION Bilateral 09/2014   both eyes   CHOLECYSTECTOMY  2010   ENDOMETRIAL ABLATION     EXCISION / BIOPSY BREAST / NIPPLE / DUCT Left 2001   Patient Active Problem List   Diagnosis Date Noted   Chronic pain of left knee 08/21/2022   Lumbar radiculopathy 03/25/2022   Cough 12/27/2021   Sleep difficulties 09/27/2021   Cervicogenic headache 04/10/2021   Acute bronchitis 04/10/2021   Elevated blood pressure reading 04/10/2021   Skin abnormality 02/27/2021   Aortic atherosclerosis ([place]) 09/22/2020   Urinary urgency 03/23/2020   CKD (chronic kidney disease) stage 3, GFR 30-59 ml/min (HCC) 03/22/2020    Hyperglycemia 03/22/2020   Change in stool 11/17/2019   Right-sided chest wall pain 11/17/2019   Urinary retention 09/23/2019   SOB (shortness of breath) 01/15/2019   Upper airway cough syndrome 01/15/2019   Belching 12/14/2018   Sinus infection 12/14/2018   Reactive airway disease 12/05/2018   Tear of left rotator cuff 10/05/2018   Pre-syncope 05/19/2018   DDD (degenerative disc disease), cervical 01/15/2018   Neuralgia 09/12/2017   Presbycusis of both ears 12/11/2016   Memory difficulties 11/06/2016   IBS (irritable bowel syndrome) 04/16/2016   Hyperlipidemia 09/10/2015   Osteoporosis 09/08/2015   Palpitations    Pleural plaque without asbestos 12/03/2010   Chronic neck pain 12/18/2009   DIVERTICULOSIS, COLON 03/02/2009   Lumbago 03/11/2008   Actinic keratosis 01/23/2008   CARCINOMA, BREAST, ESTROGEN RECEPTOR NEGATIVE 10/12/2007   Hypothyroidism 06/18/2007   Allergic rhinitis 06/11/2007    PCP: Billey Gosling  REFERRING PROVIDER: Lynne Leader   REFERRING DIAG:   Rationale for Evaluation and Treatment: Rehabilitation  THERAPY DIAG:  Other low back pain  ONSET DATE:   SUBJECTIVE:  SUBJECTIVE STATEMENT: 09/12/2022  Pt with less pain in leg and knee. Thinks she is doing well. Tried bridges at home, were painful for her back.     Eval:  Pt states pain in L leg since August.  Had epidural in spine L5, no help. In September. Also had nerve block. Helped back pain, but still has leg pain.  Also has seen Ortho, now with IT band pain.  Now seeing sports met. She also had coristone shot in hip 1/9.  Having to use cane at times for pain, was using walker when it was really bad.  Can't put pressure on leg. Feels better at night, sleeping through night. Feels back to baseline today after hip  injection.  Did not have much PT for this yet.  Most pain with standing/walking and with initial standing.   PERTINENT HISTORY:   PAIN:  Are you having pain? Yes: NPRS scale: 3/10 Pain location: L Leg Pain description: sore, very painful Aggravating factors: first thing in AM,  Relieving factors: none stated  PRECAUTIONS: None  WEIGHT BEARING RESTRICTIONS: No  FALLS:  Has patient fallen in last 6 months? No   OCCUPATION:   PLOF: Independent  PATIENT GOALS: decreased pain in leg.    OBJECTIVE:   DIAGNOSTIC FINDINGS:    PATIENT SURVEYS:  FOTO 68   COGNITION: Overall cognitive status: Within functional limits for tasks assessed     SENSATION: WFL   POSTURE:   PALPATION: Tenderness in distal ITB, gr troch, into glute.    LUMBAR ROM:   AROM eval  Flexion wfl  Extension Mod limitation  Right lateral flexion Mild limitation  Left lateral flexion Mild limitation  Right rotation   Left rotation    (Blank rows = not tested)  LOWER EXTREMITY MMT   MMT Right eval Left eval  Hip flexion 4 4-  Hip extension    Hip abduction 4 4-  Hip adduction    Hip internal rotation    Hip external rotation    Knee flexion  4+  Knee extension  4+  Ankle dorsiflexion    Ankle plantarflexion    Ankle inversion    Ankle eversion     (Blank rows = not tested)   LUMBAR SPECIAL TESTS:    TODAY'S TREATMENT:                                                                                                                              DATE:   09/12/22: Therapeutic Exercise: Aerobic: Bike L1 x 6 min;  Supine:   TA with education on correct performance x 10 min; Bridging x 5 with education on mechanis. ( No pain)  Seated:  sit to stand x 10 with education on mechanics for back and LEs, and core activation.  S/L:  Hip abd 2 x 10;  Standing:  Stretches:  Seated piriformis 30 sec x 2 mil bil;  Seated HSS 30 sec x 3 bil;  Neuromuscular Re-education: Manual Therapy: long  leg distraction to L hip;  Modalities:     09/09/22: Therapeutic Exercise: Aerobic: Supine:  Clams Blue TB 2x10 with TA;   bridging 2 x 10 ; SLR 2 x 10 with TA; TA with education on correct performance 2 x 10;  Seated: S/L:  Hip abd 2 x 10;  Standing:  Stretches:  Seated piriformis 30 sec x 2 mil bil;   LTR x 15; DKTC x 3 in between core strength;  Neuromuscular Re-education: Manual Therapy: long leg distraction to L hip; manual hip IR/ER, and ITB stretches. Roller to L ITB and glute  Modalities: ionto with dexamethasone, 4 hr patch to L distal ITB   09/05/22: Therapeutic Exercise: Aerobic: Supine:  Clams Blue TB x 15; bridging 2 x10 ;  Seated: S/L:  Hip abd 2 x 10;  Clams 2 x 10;  Standing:  Stretches:  piriformis modified fig 4, x 2 mil bil;   LTR x 15;  Neuromuscular Re-education: Manual Therapy: long leg distraction to L hip; manual hip IR/ER, and ITB stretches.  Modalities: ionto with dexamethasone, 4 hr patch to L distal ITB    PATIENT EDUCATION:  Education details: PT POC, Exam findings, HEP Person educated: Patient Education method: Explanation, Demonstration, Tactile cues, Verbal cues, and Handouts Education comprehension: verbalized understanding, returned demonstration, verbal cues required, tactile cues required, and needs further education  HOME EXERCISE PROGRAM Access Code: [email or web address]   ASSESSMENT:  CLINICAL IMPRESSION: 09/12/2022  Pt with improving pain and tenderness at distal ITB. She has improving ability for s/l hip abd. She is more challenged with stability in standing position, seen today. She is challenged with ability for TA contraction. Improved ability after education and practice today. Recommended continued practice with HEP. Will benefit from continued progression of strength for hips and core.   Eval: Patient presents with primary complaint of increased pain in L leg and hip. She has had ongoing deficits in back as well. She has soreness in gr  trochanter, glute, and at distal ITB. She has weakness in hip and has had ongoing pain since August. She has decreased ability for sustaining standing activity, walking, and functional activity. Pt to benefit from skilled PT to improve deficits and pain.   OBJECTIVE IMPAIRMENTS: Abnormal gait, decreased activity tolerance, decreased knowledge of use of DME, decreased mobility, difficulty walking, decreased ROM, decreased strength, increased muscle spasms, improper body mechanics, and pain.   ACTIVITY LIMITATIONS: carrying, lifting, bending, standing, squatting, stairs, transfers, and locomotion level  PARTICIPATION LIMITATIONS: cleaning, laundry, shopping, and community activity  PERSONAL FACTORS: Time since onset of injury/illness/exacerbation are also affecting patient's functional outcome.   REHAB POTENTIAL: Good  CLINICAL DECISION MAKING: Stable/uncomplicated  EVALUATION COMPLEXITY: Low   GOALS: Goals reviewed with patient? Yes  SHORT TERM GOALS: Target date: 09/17/2022    Pt to be independent with initial HEP  Goal status: INITIAL  2.  Pt to report decreased pain in ITB to 0-3/10  Goal status: INITIAL   LONG TERM GOALS: Target date: 10/29/2022  Pt to be independent with final HEP   Goal status: INITIAL  2.  Pt to report decreased pain in hip and LE to 0-3/10 with standing activity.   Goal status: INITIAL  3.  Pt to demo increased strength of L hip and LE to at least 4+/5 to improve ability for stairs and gait.   Goal status: INITIAL  4.  Pt to report ability for standing activity for at least 30 min  with pain 0-3/10.    Goal status: INITIAL    PLAN:  PT FREQUENCY: 1-2x/week  PT DURATION: 8 weeks  PLANNED INTERVENTIONS: Therapeutic exercises, Therapeutic activity, Neuromuscular re-education, Balance training, Gait training, Patient/Family education, Self Care, Joint mobilization, Joint manipulation, Stair training, DME instructions, Aquatic Therapy, Dry  Needling, Electrical stimulation, Spinal manipulation, Spinal mobilization, Cryotherapy, Moist heat, Taping, Traction, Ultrasound, Ionotophoresis '4mg'$ /ml Dexamethasone, and Manual therapy.  PLAN FOR NEXT SESSION:    Lyndee Hensen, PT, DPT 9:57 AM  09/12/22

## 2022-09-16 ENCOUNTER — Encounter: Payer: Self-pay | Admitting: Physical Therapy

## 2022-09-16 ENCOUNTER — Ambulatory Visit: Payer: Medicare HMO | Admitting: Physical Therapy

## 2022-09-16 DIAGNOSIS — M5459 Other low back pain: Secondary | ICD-10-CM | POA: Diagnosis not present

## 2022-09-16 DIAGNOSIS — M25552 Pain in left hip: Secondary | ICD-10-CM

## 2022-09-16 NOTE — Therapy (Signed)
OUTPATIENT PHYSICAL THERAPY THORACOLUMBAR TREATMENT   Patient Name: Emily [name] MRN: [id number] DOB:Mar 27, 1941, 82 y.o., female Today's Date: 09/16/2022  END OF SESSION:  PT End of Session - 09/16/22 0914     Visit Number 5    Number of Visits 16    Date for PT Re-Evaluation 10/29/22    Authorization Type [employer]    PT Start Time [phone]    PT Stop Time 0930    PT Time Calculation (min) 40 min    Activity Tolerance Patient tolerated treatment well    Behavior During Therapy WFL for tasks assessed/performed                Past Medical History:  Diagnosis Date   Actinic keratosis    Allergic rhinitis    BACK PAIN    CARCINOMA, BREAST, 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   L s/p lumpectomy, chemo and XRT   Cervicalgia    Cholelithiasis    DIVERTICULOSIS, COLON    ENDOMETRIOSIS    GERD (gastroesophageal reflux disease)    Heart palpitations    Hypertension    HYPOTHYROIDISM    Left wrist fracture 11/06/2016   10/2016 - fell of high stool   Osteoporosis    Personal history of chemotherapy    Personal history of radiation therapy    Pleural plaque without asbestos    CT chest 10/2010: R>L lobes, upper> lower   Squamous cell carcinoma    History of BC   Past Surgical History:  Procedure Laterality Date   BREAST LUMPECTOMY  2001   left   CATARACT EXTRACTION Bilateral 09/2014   both eyes   CHOLECYSTECTOMY  2010   ENDOMETRIAL ABLATION     EXCISION / BIOPSY BREAST / NIPPLE / DUCT Left 2001   Patient Active Problem List   Diagnosis Date Noted   Chronic pain of left knee 08/21/2022   Lumbar radiculopathy 03/25/2022   Cough 12/27/2021   Sleep difficulties 09/27/2021   Cervicogenic headache 04/10/2021   Acute bronchitis 04/10/2021   Elevated blood pressure reading 04/10/2021   Skin abnormality 02/27/2021   Aortic atherosclerosis ([place]) 09/22/2020   Urinary urgency 03/23/2020   CKD (chronic kidney disease) stage 3, GFR 30-59 ml/min (HCC) 03/22/2020    Hyperglycemia 03/22/2020   Change in stool 11/17/2019   Right-sided chest wall pain 11/17/2019   Urinary retention 09/23/2019   SOB (shortness of breath) 01/15/2019   Upper airway cough syndrome 01/15/2019   Belching 12/14/2018   Sinus infection 12/14/2018   Reactive airway disease 12/05/2018   Tear of left rotator cuff 10/05/2018   Pre-syncope 05/19/2018   DDD (degenerative disc disease), cervical 01/15/2018   Neuralgia 09/12/2017   Presbycusis of both ears 12/11/2016   Memory difficulties 11/06/2016   IBS (irritable bowel syndrome) 04/16/2016   Hyperlipidemia 09/10/2015   Osteoporosis 09/08/2015   Palpitations    Pleural plaque without asbestos 12/03/2010   Chronic neck pain 12/18/2009   DIVERTICULOSIS, COLON 03/02/2009   Lumbago 03/11/2008   Actinic keratosis 01/23/2008   CARCINOMA, BREAST, ESTROGEN RECEPTOR NEGATIVE 10/12/2007   Hypothyroidism 06/18/2007   Allergic rhinitis 06/11/2007    PCP: Billey Gosling  REFERRING PROVIDER: Lynne Leader   REFERRING DIAG:   Rationale for Evaluation and Treatment: Rehabilitation  THERAPY DIAG:  Other low back pain  Pain in left hip  ONSET DATE:   SUBJECTIVE:  SUBJECTIVE STATEMENT: 09/16/2022  Pt with much less pain in leg and knee. No pain in leg. States ongoing soreness in low back/sacrum. Mostly with sitting.     Eval:  Pt states pain in L leg since August.  Had epidural in spine L5, no help. In September. Also had nerve block. Helped back pain, but still has leg pain.  Also has seen Ortho, now with IT band pain.  Now seeing sports met. She also had coristone shot in hip 1/9.  Having to use cane at times for pain, was using walker when it was really bad.  Can't put pressure on leg. Feels better at night, sleeping through night. Feels back to  baseline today after hip injection.  Did not have much PT for this yet.  Most pain with standing/walking and with initial standing.   PERTINENT HISTORY:   PAIN:  Are you having pain? Yes: NPRS scale: 3-4/10 Pain location: low back/sacrum Pain description: sore, very painful Aggravating factors: sitting  Relieving factors: none stated  PRECAUTIONS: None  WEIGHT BEARING RESTRICTIONS: No  FALLS:  Has patient fallen in last 6 months? No   OCCUPATION:   PLOF: Independent  PATIENT GOALS: decreased pain in leg.    OBJECTIVE:   DIAGNOSTIC FINDINGS:    PATIENT SURVEYS:  FOTO 30   COGNITION: Overall cognitive status: Within functional limits for tasks assessed     SENSATION: WFL   POSTURE:   PALPATION: Tenderness in distal ITB, gr troch, into glute.    LUMBAR ROM:   AROM eval  Flexion wfl  Extension Mod limitation  Right lateral flexion Mild limitation  Left lateral flexion Mild limitation  Right rotation   Left rotation    (Blank rows = not tested)  LOWER EXTREMITY MMT   MMT Right eval Left eval  Hip flexion 4 4-  Hip extension    Hip abduction 4 4-  Hip adduction    Hip internal rotation    Hip external rotation    Knee flexion  4+  Knee extension  4+  Ankle dorsiflexion    Ankle plantarflexion    Ankle inversion    Ankle eversion     (Blank rows = not tested)   LUMBAR SPECIAL TESTS:    TODAY'S TREATMENT:                                                                                                                              DATE:   09/16/22: Therapeutic Exercise: Aerobic: Bike L1 x 7 min;  Supine:  Bridging 2 x 5 ; pelvic tilts  Seated:  sit to stand x 10( no UE support)  with education on mechanics for back and LEs, and core activation.  S/L:  Hip abd 2 x 10; clams 2 x 10;  Standing:  Hip abd and ext :  2 x 10 bil;  Stretches:  Seated pelvic tilts x 20 ;Seated HSS 30 sec x 3 bil;  Neuromuscular Re-education: Manual Therapy:  long leg distraction to L hip;  Modalities:    09/12/22: Therapeutic Exercise: Aerobic: Bike L1 x 6 min;  Supine:   TA with education on correct performance x 10 min; Bridging x 5 with education on mechanis. ( No pain)  Seated:  sit to stand x 10 with education on mechanics for back and LEs, and core activation.  S/L:  Hip abd 2 x 10;  Standing:  Stretches:  Seated piriformis 30 sec x 2 mil bil;  Seated HSS 30 sec x 3 bil;  Neuromuscular Re-education: Manual Therapy: long leg distraction to L hip;  Modalities:    09/09/22: Therapeutic Exercise: Aerobic: Supine:  Clams Blue TB 2x10 with TA;   bridging 2 x 10 ; SLR 2 x 10 with TA; TA with education on correct performance 2 x 10;  Seated: S/L:  Hip abd 2 x 10;  Standing:  Stretches:  Seated piriformis 30 sec x 2 mil bil;   LTR x 15; DKTC x 3 in between core strength;  Neuromuscular Re-education: Manual Therapy: long leg distraction to L hip; manual hip IR/ER, and ITB stretches. Roller to L ITB and glute  Modalities: ionto with dexamethasone, 4 hr patch to L distal ITB   09/05/22: Therapeutic Exercise: Aerobic: Supine:  Clams Blue TB x 15; bridging 2 x10 ;  Seated: S/L:  Hip abd 2 x 10;  Clams 2 x 10;  Standing:  Stretches:  piriformis modified fig 4, x 2 mil bil;   LTR x 15;  Neuromuscular Re-education: Manual Therapy: long leg distraction to L hip; manual hip IR/ER, and ITB stretches.  Modalities: ionto with dexamethasone, 4 hr patch to L distal ITB    PATIENT EDUCATION:  Education details: PT POC, Exam findings, HEP Person educated: Patient Education method: Explanation, Demonstration, Tactile cues, Verbal cues, and Handouts Education comprehension: verbalized understanding, returned demonstration, verbal cues required, tactile cues required, and needs further education  HOME EXERCISE PROGRAM Access Code: [email or web address]   ASSESSMENT:  CLINICAL IMPRESSION: 09/16/2022 Most pain in low lumbar/sacrum area today. Pain in L  leg and hip much improved. She is improving with ability for TA and bridge without pain. Very challenged with hip ext today, will benefit from continued strengthening.   Eval: Patient presents with primary complaint of increased pain in L leg and hip. She has had ongoing deficits in back as well. She has soreness in gr trochanter, glute, and at distal ITB. She has weakness in hip and has had ongoing pain since August. She has decreased ability for sustaining standing activity, walking, and functional activity. Pt to benefit from skilled PT to improve deficits and pain.   OBJECTIVE IMPAIRMENTS: Abnormal gait, decreased activity tolerance, decreased knowledge of use of DME, decreased mobility, difficulty walking, decreased ROM, decreased strength, increased muscle spasms, improper body mechanics, and pain.   ACTIVITY LIMITATIONS: carrying, lifting, bending, standing, squatting, stairs, transfers, and locomotion level  PARTICIPATION LIMITATIONS: cleaning, laundry, shopping, and community activity  PERSONAL FACTORS: Time since onset of injury/illness/exacerbation are also affecting patient's functional outcome.   REHAB POTENTIAL: Good  CLINICAL DECISION MAKING: Stable/uncomplicated  EVALUATION COMPLEXITY: Low   GOALS: Goals reviewed with patient? Yes  SHORT TERM GOALS: Target date: 09/17/2022    Pt to be independent with initial HEP  Goal status: INITIAL  2.  Pt to report decreased pain in ITB to 0-3/10  Goal status: INITIAL   LONG TERM GOALS: Target date: 10/29/2022  Pt to be independent with final  HEP   Goal status: INITIAL  2.  Pt to report decreased pain in hip and LE to 0-3/10 with standing activity.   Goal status: INITIAL  3.  Pt to demo increased strength of L hip and LE to at least 4+/5 to improve ability for stairs and gait.   Goal status: INITIAL  4.  Pt to report ability for standing activity for at least 30 min with pain 0-3/10.    Goal status:  INITIAL   PLAN:  PT FREQUENCY: 1-2x/week  PT DURATION: 8 weeks  PLANNED INTERVENTIONS: Therapeutic exercises, Therapeutic activity, Neuromuscular re-education, Balance training, Gait training, Patient/Family education, Self Care, Joint mobilization, Joint manipulation, Stair training, DME instructions, Aquatic Therapy, Dry Needling, Electrical stimulation, Spinal manipulation, Spinal mobilization, Cryotherapy, Moist heat, Taping, Traction, Ultrasound, Ionotophoresis '4mg'$ /ml Dexamethasone, and Manual therapy.  PLAN FOR NEXT SESSION:    Lyndee Hensen, PT, DPT 9:14 AM  09/16/22

## 2022-09-19 ENCOUNTER — Encounter: Payer: Self-pay | Admitting: Physical Therapy

## 2022-09-19 ENCOUNTER — Ambulatory Visit: Payer: Medicare HMO | Admitting: Physical Therapy

## 2022-09-19 DIAGNOSIS — M25552 Pain in left hip: Secondary | ICD-10-CM | POA: Diagnosis not present

## 2022-09-19 DIAGNOSIS — M5459 Other low back pain: Secondary | ICD-10-CM

## 2022-09-19 NOTE — Therapy (Signed)
OUTPATIENT PHYSICAL THERAPY THORACOLUMBAR TREATMENT   Patient Name: Karlina Suares MRN: [id number] DOB:[birth year], 82 y.o., female Today's Date: 09/19/2022  END OF SESSION:  PT End of Session - 09/19/22 0806     Visit Number 6    Number of Visits 16    Date for PT Re-Evaluation 10/29/22    Authorization Type [employer]    PT Start Time 0807    PT Stop Time 0845    PT Time Calculation (min) 38 min    Activity Tolerance Patient tolerated treatment well    Behavior During Therapy WFL for tasks assessed/performed                Past Medical History:  Diagnosis Date   Actinic keratosis    Allergic rhinitis    BACK PAIN    CARCINOMA, BREAST, 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   L s/p lumpectomy, chemo and XRT   Cervicalgia    Cholelithiasis    DIVERTICULOSIS, COLON    ENDOMETRIOSIS    GERD (gastroesophageal reflux disease)    Heart palpitations    Hypertension    HYPOTHYROIDISM    Left wrist fracture 11/06/2016   10/2016 - fell of high stool   Osteoporosis    Personal history of chemotherapy    Personal history of radiation therapy    Pleural plaque without asbestos    CT chest 10/2010: R>L lobes, upper> lower   Squamous cell carcinoma    History of BC   Past Surgical History:  Procedure Laterality Date   BREAST LUMPECTOMY  2001   left   CATARACT EXTRACTION Bilateral 09/2014   both eyes   CHOLECYSTECTOMY  2010   ENDOMETRIAL ABLATION     EXCISION / BIOPSY BREAST / NIPPLE / DUCT Left 2001   Patient Active Problem List   Diagnosis Date Noted   Chronic pain of left knee 08/21/2022   Lumbar radiculopathy 03/25/2022   Cough 12/27/2021   Sleep difficulties 09/27/2021   Cervicogenic headache 04/10/2021   Acute bronchitis 04/10/2021   Elevated blood pressure reading 04/10/2021   Skin abnormality 02/27/2021   Aortic atherosclerosis ([place]) 09/22/2020   Urinary urgency 03/23/2020   CKD (chronic kidney disease) stage 3, GFR 30-59 ml/min (HCC) 03/22/2020    Hyperglycemia 03/22/2020   Change in stool 11/17/2019   Right-sided chest wall pain 11/17/2019   Urinary retention 09/23/2019   SOB (shortness of breath) 01/15/2019   Upper airway cough syndrome 01/15/2019   Belching 12/14/2018   Sinus infection 12/14/2018   Reactive airway disease 12/05/2018   Tear of left rotator cuff 10/05/2018   Pre-syncope 05/19/2018   DDD (degenerative disc disease), cervical 01/15/2018   Neuralgia 09/12/2017   Presbycusis of both ears 12/11/2016   Memory difficulties 11/06/2016   IBS (irritable bowel syndrome) 04/16/2016   Hyperlipidemia 09/10/2015   Osteoporosis 09/08/2015   Palpitations    Pleural plaque without asbestos 12/03/2010   Chronic neck pain 12/18/2009   DIVERTICULOSIS, COLON 03/02/2009   Lumbago 03/11/2008   Actinic keratosis 01/23/2008   CARCINOMA, BREAST, ESTROGEN RECEPTOR NEGATIVE 10/12/2007   Hypothyroidism 06/18/2007   Allergic rhinitis 06/11/2007    PCP: Billey Gosling  REFERRING PROVIDER: Lynne Leader   REFERRING DIAG:   Rationale for Evaluation and Treatment: Rehabilitation  THERAPY DIAG:  Other low back pain  Pain in left hip  ONSET DATE:   SUBJECTIVE:  SUBJECTIVE STATEMENT: 09/19/2022  Pt states back and LE doing quite well. Mild soreness in front of L knee.    Eval:  Pt states pain in L leg since August.  Had epidural in spine L5, no help. In September. Also had nerve block. Helped back pain, but still has leg pain.  Also has seen Ortho, now with IT band pain.  Now seeing sports met. She also had coristone shot in hip 1/9.  Having to use cane at times for pain, was using walker when it was really bad.  Can't put pressure on leg. Feels better at night, sleeping through night. Feels back to baseline today after hip injection.  Did not have  much PT for this yet.  Most pain with standing/walking and with initial standing.   PERTINENT HISTORY:   PAIN:  Are you having pain? Yes: NPRS scale: 3-4/10 Pain location: low back/sacrum Pain description: sore, very painful Aggravating factors: sitting  Relieving factors: none stated  PRECAUTIONS: None  WEIGHT BEARING RESTRICTIONS: No  FALLS:  Has patient fallen in last 6 months? No   OCCUPATION:   PLOF: Independent  PATIENT GOALS: decreased pain in leg.    OBJECTIVE:   DIAGNOSTIC FINDINGS:    PATIENT SURVEYS:  FOTO 62   COGNITION: Overall cognitive status: Within functional limits for tasks assessed     SENSATION: WFL   POSTURE:   PALPATION: Tenderness in distal ITB, gr troch, into glute.    LUMBAR ROM:   AROM eval  Flexion wfl  Extension Mod limitation  Right lateral flexion Mild limitation  Left lateral flexion Mild limitation  Right rotation   Left rotation    (Blank rows = not tested)  LOWER EXTREMITY MMT   MMT Right eval Left eval  Hip flexion 4 4-  Hip extension    Hip abduction 4 4-  Hip adduction    Hip internal rotation    Hip external rotation    Knee flexion  4+  Knee extension  4+  Ankle dorsiflexion    Ankle plantarflexion    Ankle inversion    Ankle eversion     (Blank rows = not tested)   LUMBAR SPECIAL TESTS:    TODAY'S TREATMENT:                                                                                                                              DATE:   09/19/22: Therapeutic Exercise: Aerobic: Bike L1 x 5 min;  Supine:  Bridging 2 x 5;   Seated:  sit to stand x 10 ( no UE support)  with TA; LAQ 3lb x 15 bil;  S/L:  Hip abd 2 x 10;   Standing:  Hip abd and ext :  2 x 10 ea bil;  Stretches:  Seated pelvic tilts x 20 ; Seated HSS 30 sec x 1 bil; Seated piriformis x 2 bil;  Neuromuscular Re-education: Manual Therapy: long leg distraction to L  hip; IASTM to distal ITB Modalities:     09/16/22: Therapeutic Exercise: Aerobic: Bike L1 x 7 min;  Supine:  Bridging 2 x 5 ; pelvic tilts  Seated:  sit to stand x 10( no UE support)  with education on mechanics for back and LEs, and core activation.  S/L:  Hip abd 2 x 10; clams 2 x 10;  Standing:  Hip abd and ext :  2 x 10 bil;  Stretches:  Seated pelvic tilts x 20 ;Seated HSS 30 sec x 3 bil;  Neuromuscular Re-education: Manual Therapy: long leg distraction to L hip;  Modalities:    09/12/22: Therapeutic Exercise: Aerobic: Bike L1 x 6 min;  Supine:   TA with education on correct performance x 10 min; Bridging x 5 with education on mechanis. ( No pain)  Seated:  sit to stand x 10 with education on mechanics for back and LEs, and core activation.  S/L:  Hip abd 2 x 10;  Standing:  Stretches:  Seated piriformis 30 sec x 2 mil bil;  Seated HSS 30 sec x 3 bil;  Neuromuscular Re-education: Manual Therapy: long leg distraction to L hip;  Modalities:     PATIENT EDUCATION:  Education details: Reviewed HEP Person educated: Patient Education method: Explanation, Demonstration, Tactile cues, Verbal cues, and Handouts Education comprehension: verbalized understanding, returned demonstration, verbal cues required, tactile cues required, and needs further education  HOME EXERCISE PROGRAM Access Code: [email or web address]   ASSESSMENT:  CLINICAL IMPRESSION: 09/19/2022 Pt with  Tenderness at distal ITB with palpation and IASTM. Improving ability for strengthening, ,will benefit from continued core and hip strength.   Most pain in low lumbar/sacrum area today. Pain in L leg and hip much improved. She is improving with ability for TA and bridge without pain. Very challenged with hip ext today, will benefit from continued strengthening.   Eval: Patient presents with primary complaint of increased pain in L leg and hip. She has had ongoing deficits in back as well. She has soreness in gr trochanter, glute, and at distal ITB. She has  weakness in hip and has had ongoing pain since August. She has decreased ability for sustaining standing activity, walking, and functional activity. Pt to benefit from skilled PT to improve deficits and pain.   OBJECTIVE IMPAIRMENTS: Abnormal gait, decreased activity tolerance, decreased knowledge of use of DME, decreased mobility, difficulty walking, decreased ROM, decreased strength, increased muscle spasms, improper body mechanics, and pain.   ACTIVITY LIMITATIONS: carrying, lifting, bending, standing, squatting, stairs, transfers, and locomotion level  PARTICIPATION LIMITATIONS: cleaning, laundry, shopping, and community activity  PERSONAL FACTORS: Time since onset of injury/illness/exacerbation are also affecting patient's functional outcome.   REHAB POTENTIAL: Good  CLINICAL DECISION MAKING: Stable/uncomplicated  EVALUATION COMPLEXITY: Low   GOALS: Goals reviewed with patient? Yes  SHORT TERM GOALS: Target date: 09/17/2022    Pt to be independent with initial HEP  Goal status: INITIAL  2.  Pt to report decreased pain in ITB to 0-3/10  Goal status: INITIAL   LONG TERM GOALS: Target date: 10/29/2022  Pt to be independent with final HEP   Goal status: INITIAL  2.  Pt to report decreased pain in hip and LE to 0-3/10 with standing activity.   Goal status: INITIAL  3.  Pt to demo increased strength of L hip and LE to at least 4+/5 to improve ability for stairs and gait.   Goal status: INITIAL  4.  Pt to report ability for standing activity for at  least 30 min with pain 0-3/10.    Goal status: INITIAL   PLAN:  PT FREQUENCY: 1-2x/week  PT DURATION: 8 weeks  PLANNED INTERVENTIONS: Therapeutic exercises, Therapeutic activity, Neuromuscular re-education, Balance training, Gait training, Patient/Family education, Self Care, Joint mobilization, Joint manipulation, Stair training, DME instructions, Aquatic Therapy, Dry Needling, Electrical stimulation, Spinal  manipulation, Spinal mobilization, Cryotherapy, Moist heat, Taping, Traction, Ultrasound, Ionotophoresis '4mg'$ /ml Dexamethasone, and Manual therapy.  PLAN FOR NEXT SESSION:  Hip extension activation, core strength.    Lyndee Hensen, PT, DPT 9:17 PM  09/19/22

## 2022-09-23 ENCOUNTER — Ambulatory Visit: Payer: Medicare HMO | Admitting: Physical Therapy

## 2022-09-23 ENCOUNTER — Encounter: Payer: Self-pay | Admitting: Physical Therapy

## 2022-09-23 DIAGNOSIS — M25552 Pain in left hip: Secondary | ICD-10-CM

## 2022-09-23 DIAGNOSIS — M25571 Pain in right ankle and joints of right foot: Secondary | ICD-10-CM | POA: Diagnosis not present

## 2022-09-23 DIAGNOSIS — M5459 Other low back pain: Secondary | ICD-10-CM | POA: Diagnosis not present

## 2022-09-23 NOTE — Therapy (Signed)
OUTPATIENT PHYSICAL THERAPY THORACOLUMBAR TREATMENT   Patient Name: Emily [name] MRN: [id number] DOB:1941/04/08, 82 y.o., female Today's Date: 09/23/2022  END OF SESSION:  PT End of Session - 09/23/22 0851     Visit Number 7    Number of Visits 16    Date for PT Re-Evaluation 10/29/22    Authorization Type [employer]    PT Start Time [phone]    PT Stop Time 0930    PT Time Calculation (min) 41 min    Activity Tolerance Patient tolerated treatment well    Behavior During Therapy WFL for tasks assessed/performed                 Past Medical History:  Diagnosis Date   Actinic keratosis    Allergic rhinitis    BACK PAIN    CARCINOMA, BREAST, 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   L s/p lumpectomy, chemo and XRT   Cervicalgia    Cholelithiasis    DIVERTICULOSIS, COLON    ENDOMETRIOSIS    GERD (gastroesophageal reflux disease)    Heart palpitations    Hypertension    HYPOTHYROIDISM    Left wrist fracture 11/06/2016   10/2016 - fell of high stool   Osteoporosis    Personal history of chemotherapy    Personal history of radiation therapy    Pleural plaque without asbestos    CT chest 10/2010: R>L lobes, upper> lower   Squamous cell carcinoma    History of BC   Past Surgical History:  Procedure Laterality Date   BREAST LUMPECTOMY  2001   left   CATARACT EXTRACTION Bilateral 09/2014   both eyes   CHOLECYSTECTOMY  2010   ENDOMETRIAL ABLATION     EXCISION / BIOPSY BREAST / NIPPLE / DUCT Left 2001   Patient Active Problem List   Diagnosis Date Noted   Chronic pain of left knee 08/21/2022   Lumbar radiculopathy 03/25/2022   Cough 12/27/2021   Sleep difficulties 09/27/2021   Cervicogenic headache 04/10/2021   Acute bronchitis 04/10/2021   Elevated blood pressure reading 04/10/2021   Skin abnormality 02/27/2021   Aortic atherosclerosis ([place]) 09/22/2020   Urinary urgency 03/23/2020   CKD (chronic kidney disease) stage 3, GFR 30-59 ml/min (HCC) 03/22/2020    Hyperglycemia 03/22/2020   Change in stool 11/17/2019   Right-sided chest wall pain 11/17/2019   Urinary retention 09/23/2019   SOB (shortness of breath) 01/15/2019   Upper airway cough syndrome 01/15/2019   Belching 12/14/2018   Sinus infection 12/14/2018   Reactive airway disease 12/05/2018   Tear of left rotator cuff 10/05/2018   Pre-syncope 05/19/2018   DDD (degenerative disc disease), cervical 01/15/2018   Neuralgia 09/12/2017   Presbycusis of both ears 12/11/2016   Memory difficulties 11/06/2016   IBS (irritable bowel syndrome) 04/16/2016   Hyperlipidemia 09/10/2015   Osteoporosis 09/08/2015   Palpitations    Pleural plaque without asbestos 12/03/2010   Chronic neck pain 12/18/2009   DIVERTICULOSIS, COLON 03/02/2009   Lumbago 03/11/2008   Actinic keratosis 01/23/2008   CARCINOMA, BREAST, ESTROGEN RECEPTOR NEGATIVE 10/12/2007   Hypothyroidism 06/18/2007   Allergic rhinitis 06/11/2007    PCP: Billey Gosling  REFERRING PROVIDER: Lynne Leader   REFERRING DIAG:   Rationale for Evaluation and Treatment: Rehabilitation  THERAPY DIAG:  Other low back pain  Pain in left hip  Pain in right ankle and joints of right foot  ONSET DATE:   SUBJECTIVE:  SUBJECTIVE STATEMENT: 09/24/2022  Pt states mild pain around knee in last few days.   Eval:  Pt states pain in L leg since August.  Had epidural in spine L5, no help. In September. Also had nerve block. Helped back pain, but still has leg pain.  Also has seen Ortho, now with IT band pain.  Now seeing sports met. She also had coristone shot in hip 1/9.  Having to use cane at times for pain, was using walker when it was really bad.  Can't put pressure on leg. Feels better at night, sleeping through night. Feels back to baseline today after hip  injection.  Did not have much PT for this yet.  Most pain with standing/walking and with initial standing.   PERTINENT HISTORY:   PAIN:  Are you having pain? Yes: NPRS scale: 2-4/10 Pain location: low back/sacrum Pain description: sore, very painful Aggravating factors: sitting  Relieving factors: none stated  PRECAUTIONS: None  WEIGHT BEARING RESTRICTIONS: No  FALLS:  Has patient fallen in last 6 months? No   OCCUPATION:   PLOF: Independent  PATIENT GOALS: decreased pain in leg.    OBJECTIVE:   DIAGNOSTIC FINDINGS:    PATIENT SURVEYS:  FOTO 70   COGNITION: Overall cognitive status: Within functional limits for tasks assessed     SENSATION: WFL   POSTURE:   PALPATION: Tenderness in distal ITB, gr troch, into glute.    LUMBAR ROM:   AROM eval  Flexion wfl  Extension Mod limitation  Right lateral flexion Mild limitation  Left lateral flexion Mild limitation  Right rotation   Left rotation    (Blank rows = not tested)  LOWER EXTREMITY MMT   MMT Right eval Left eval  Hip flexion 4 4-  Hip extension    Hip abduction 4 4-  Hip adduction    Hip internal rotation    Hip external rotation    Knee flexion  4+  Knee extension  4+  Ankle dorsiflexion    Ankle plantarflexion    Ankle inversion    Ankle eversion     (Blank rows = not tested)   LUMBAR SPECIAL TESTS:    TODAY'S TREATMENT:                                                                                                                              DATE:   09/23/22: Therapeutic Exercise: Aerobic: Bike L1 x 8 min;  Supine:  Alternating clams x 15 GTB with tA;  Seated:  sit to stand x 10 ( no UE support) GTB at thighs,  with TA;  LAQ 3lb 2x 10 on L;  [place] [address] GTB x15 on L;  S/L:    Prone: hip ext x 10 bil; pillow under stomach Standing:  Hip abd and ext :  2 x 10 ea bil; Marching x 15 with TA;  Bwd walking for glute activation/slow x 2 in hallway.  Stretches:   Neuromuscular  Re-education: Manual Therapy:   Modalities:   09/19/22: Therapeutic Exercise: Aerobic: Bike L1 x 5 min;  Supine:  Bridging 2 x 5;   Seated:  sit to stand x 10 ( no UE support)  with TA; LAQ 3lb x 15 bil;  S/L:  Hip abd 2 x 10;   Standing:  Hip abd and ext :  2 x 10 ea bil;  Stretches:  Seated pelvic tilts x 20 ; Seated HSS 30 sec x 1 bil; Seated piriformis x 2 bil;  Neuromuscular Re-education: Manual Therapy: long leg distraction to L hip; IASTM to distal ITB Modalities:    09/16/22: Therapeutic Exercise: Aerobic: Bike L1 x 7 min;  Supine:  Bridging 2 x 5 ; pelvic tilts  Seated:  sit to stand x 10( no UE support)  with education on mechanics for back and LEs, and core activation.  S/L:  Hip abd 2 x 10; clams 2 x 10;  Standing:  Hip abd and ext :  2 x 10 bil;  Stretches:  Seated pelvic tilts x 20 ;Seated HSS 30 sec x 3 bil;  Neuromuscular Re-education: Manual Therapy: long leg distraction to L hip;  Modalities:    PATIENT EDUCATION:  Education details: Reviewed HEP Person educated: Patient Education method: Explanation, Demonstration, Tactile cues, Verbal cues, and Handouts Education comprehension: verbalized understanding, returned demonstration, verbal cues required, tactile cues required, and needs further education  HOME EXERCISE PROGRAM Access Code: [email or web address]   ASSESSMENT:  CLINICAL IMPRESSION: 09/24/2022 Focus on hip strengthening today. Pt challenged with glute activation on L vs R. Will continue to benefit from strengthening. Does have less pain with activity,but does have tenderness at distal ITB with palpation.   Eval: Patient presents with primary complaint of increased pain in L leg and hip. She has had ongoing deficits in back as well. She has soreness in gr trochanter, glute, and at distal ITB. She has weakness in hip and has had ongoing pain since August. She has decreased ability for sustaining standing activity, walking, and functional activity. Pt to benefit  from skilled PT to improve deficits and pain.   OBJECTIVE IMPAIRMENTS: Abnormal gait, decreased activity tolerance, decreased knowledge of use of DME, decreased mobility, difficulty walking, decreased ROM, decreased strength, increased muscle spasms, improper body mechanics, and pain.   ACTIVITY LIMITATIONS: carrying, lifting, bending, standing, squatting, stairs, transfers, and locomotion level  PARTICIPATION LIMITATIONS: cleaning, laundry, shopping, and community activity  PERSONAL FACTORS: Time since onset of injury/illness/exacerbation are also affecting patient's functional outcome.   REHAB POTENTIAL: Good  CLINICAL DECISION MAKING: Stable/uncomplicated  EVALUATION COMPLEXITY: Low   GOALS: Goals reviewed with patient? Yes  SHORT TERM GOALS: Target date: 09/17/2022    Pt to be independent with initial HEP  Goal status: MET  2.  Pt to report decreased pain in ITB to 0-3/10  Goal status: MET   LONG TERM GOALS: Target date: 10/29/2022  Pt to be independent with final HEP   Goal status: INITIAL  2.  Pt to report decreased pain in hip and LE to 0-3/10 with standing activity.   Goal status: INITIAL  3.  Pt to demo increased strength of L hip and LE to at least 4+/5 to improve ability for stairs and gait.   Goal status: INITIAL  4.  Pt to report ability for standing activity for at least 30 min with pain 0-3/10.    Goal status: INITIAL   PLAN:  PT FREQUENCY: 1-2x/week  PT DURATION: 8 weeks  PLANNED INTERVENTIONS:  Therapeutic exercises, Therapeutic activity, Neuromuscular re-education, Balance training, Gait training, Patient/Family education, Self Care, Joint mobilization, Joint manipulation, Stair training, DME instructions, Aquatic Therapy, Dry Needling, Electrical stimulation, Spinal manipulation, Spinal mobilization, Cryotherapy, Moist heat, Taping, Traction, Ultrasound, Ionotophoresis '4mg'$ /ml Dexamethasone, and Manual therapy.  PLAN FOR NEXT SESSION:  Hip  extension activation, core strength.    Lyndee Hensen, PT, DPT 9:18 AM  09/24/22

## 2022-09-25 ENCOUNTER — Other Ambulatory Visit: Payer: Self-pay | Admitting: Internal Medicine

## 2022-09-26 ENCOUNTER — Encounter: Payer: Self-pay | Admitting: Physical Therapy

## 2022-09-26 ENCOUNTER — Ambulatory Visit: Payer: Medicare HMO | Admitting: Physical Therapy

## 2022-09-26 DIAGNOSIS — M25571 Pain in right ankle and joints of right foot: Secondary | ICD-10-CM

## 2022-09-26 DIAGNOSIS — M25552 Pain in left hip: Secondary | ICD-10-CM

## 2022-09-26 DIAGNOSIS — M5459 Other low back pain: Secondary | ICD-10-CM

## 2022-09-26 NOTE — Therapy (Signed)
OUTPATIENT PHYSICAL THERAPY THORACOLUMBAR TREATMENT   Patient Name: Emily [name] MRN: [id number] DOB:Sep 30, 1940, 82 y.o., female Today's Date: 09/26/2022  END OF SESSION:  PT End of Session - 09/26/22 0806     Visit Number 8    Number of Visits 16    Date for PT Re-Evaluation 10/29/22    Authorization Type [employer] Medicare    PT Start Time 0807    PT Stop Time 0845    PT Time Calculation (min) 38 min    Activity Tolerance Patient tolerated treatment well    Behavior During Therapy WFL for tasks assessed/performed                 Past Medical History:  Diagnosis Date   Actinic keratosis    Allergic rhinitis    BACK PAIN    CARCINOMA, BREAST, 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   L s/p lumpectomy, chemo and XRT   Cervicalgia    Cholelithiasis    DIVERTICULOSIS, COLON    ENDOMETRIOSIS    GERD (gastroesophageal reflux disease)    Heart palpitations    Hypertension    HYPOTHYROIDISM    Left wrist fracture 11/06/2016   10/2016 - fell of high stool   Osteoporosis    Personal history of chemotherapy    Personal history of radiation therapy    Pleural plaque without asbestos    CT chest 10/2010: R>L lobes, upper> lower   Squamous cell carcinoma    History of BC   Past Surgical History:  Procedure Laterality Date   BREAST LUMPECTOMY  2001   left   CATARACT EXTRACTION Bilateral 09/2014   both eyes   CHOLECYSTECTOMY  2010   ENDOMETRIAL ABLATION     EXCISION / BIOPSY BREAST / NIPPLE / DUCT Left 2001   Patient Active Problem List   Diagnosis Date Noted   Chronic pain of left knee 08/21/2022   Lumbar radiculopathy 03/25/2022   Cough 12/27/2021   Sleep difficulties 09/27/2021   Cervicogenic headache 04/10/2021   Acute bronchitis 04/10/2021   Elevated blood pressure reading 04/10/2021   Skin abnormality 02/27/2021   Aortic atherosclerosis ([place]) 09/22/2020   Urinary urgency 03/23/2020   CKD (chronic kidney disease) stage 3, GFR 30-59 ml/min (HCC) 03/22/2020    Hyperglycemia 03/22/2020   Change in stool 11/17/2019   Right-sided chest wall pain 11/17/2019   Urinary retention 09/23/2019   SOB (shortness of breath) 01/15/2019   Upper airway cough syndrome 01/15/2019   Belching 12/14/2018   Sinus infection 12/14/2018   Reactive airway disease 12/05/2018   Tear of left rotator cuff 10/05/2018   Pre-syncope 05/19/2018   DDD (degenerative disc disease), cervical 01/15/2018   Neuralgia 09/12/2017   Presbycusis of both ears 12/11/2016   Memory difficulties 11/06/2016   IBS (irritable bowel syndrome) 04/16/2016   Hyperlipidemia 09/10/2015   Osteoporosis 09/08/2015   Palpitations    Pleural plaque without asbestos 12/03/2010   Chronic neck pain 12/18/2009   DIVERTICULOSIS, COLON 03/02/2009   Lumbago 03/11/2008   Actinic keratosis 01/23/2008   CARCINOMA, BREAST, ESTROGEN RECEPTOR NEGATIVE 10/12/2007   Hypothyroidism 06/18/2007   Allergic rhinitis 06/11/2007    PCP: Billey Gosling  REFERRING PROVIDER: Lynne Leader   REFERRING DIAG:   Rationale for Evaluation and Treatment: Rehabilitation  THERAPY DIAG:  Other low back pain  Pain in left hip  Pain in right ankle and joints of right foot  ONSET DATE:   SUBJECTIVE:  SUBJECTIVE STATEMENT: 09/26/2022  Pt states no pain for last couple days.   Eval:  Pt states pain in L leg since August.  Had epidural in spine L5, no help. In September. Also had nerve block. Helped back pain, but still has leg pain.  Also has seen Ortho, now with IT band pain.  Now seeing sports met. She also had coristone shot in hip 1/9.  Having to use cane at times for pain, was using walker when it was really bad.  Can't put pressure on leg. Feels better at night, sleeping through night. Feels back to baseline today after hip injection.  Did  not have much PT for this yet.  Most pain with standing/walking and with initial standing.   PERTINENT HISTORY:   PAIN:  Are you having pain? Yes: NPRS scale: 2-4/10 Pain location: low back/sacrum Pain description: sore, very painful Aggravating factors: sitting  Relieving factors: none stated  PRECAUTIONS: None  WEIGHT BEARING RESTRICTIONS: No  FALLS:  Has patient fallen in last 6 months? No   OCCUPATION:   PLOF: Independent  PATIENT GOALS: decreased pain in leg.    OBJECTIVE:   DIAGNOSTIC FINDINGS:    PATIENT SURVEYS:  FOTO 29   COGNITION: Overall cognitive status: Within functional limits for tasks assessed     SENSATION: WFL   POSTURE:   PALPATION: Tenderness in distal ITB, gr troch, into glute.    LUMBAR ROM:   AROM eval  Flexion wfl  Extension Mod limitation  Right lateral flexion Mild limitation  Left lateral flexion Mild limitation  Right rotation   Left rotation    (Blank rows = not tested)  LOWER EXTREMITY MMT   MMT Right eval Left eval  Hip flexion 4 4-  Hip extension    Hip abduction 4 4-  Hip adduction    Hip internal rotation    Hip external rotation    Knee flexion  4+  Knee extension  4+  Ankle dorsiflexion    Ankle plantarflexion    Ankle inversion    Ankle eversion     (Blank rows = not tested)   LUMBAR SPECIAL TESTS:    TODAY'S TREATMENT:                                                                                                                              DATE:   09/26/22: Therapeutic Exercise: Aerobic: Bike L2 x 3 min; L 1 x 3 min (use towel behind back next time)  Supine:   Seated:  sit to stand x 10 ( no UE support) GTB at thighs,  with TA;  LAQ 3lb 2x 10 on L;   S/L:   hip abd 2 x 10;  Prone: hip ext x 10 bil; pillow under stomach Standing:   Bwd walking for glute activation/slow x 4 in hallway; Step ups fwd/6in x 10 bil; 4 in lateral x 10 on L;  Stretches:   LTR x  10; DKTC 30 sec x 3;   Neuromuscular Re-education: Manual Therapy:   Modalities:    09/23/22: Therapeutic Exercise: Aerobic: Bike L1 x 8 min;  Supine:  Alternating clams x 15 GTB with tA;  Seated:  sit to stand x 10 ( no UE support) GTB at thighs,  with TA;  LAQ 3lb 2x 10 on L;  [place] GTB x15 on L;  S/L:    Prone: hip ext x 10 bil; pillow under stomach Standing:  Hip abd and ext :  2 x 10 ea bil; Marching x 15 with TA;  Bwd walking for glute activation/slow x 2 in hallway.  Stretches:   Neuromuscular Re-education: Manual Therapy:   Modalities:   09/19/22: Therapeutic Exercise: Aerobic: Bike L1 x 5 min;  Supine:  Bridging 2 x 5;   Seated:  sit to stand x 10 ( no UE support)  with TA; LAQ 3lb x 15 bil;  S/L:  Hip abd 2 x 10;   Standing:  Hip abd and ext :  2 x 10 ea bil;  Stretches:  Seated pelvic tilts x 20 ; Seated HSS 30 sec x 1 bil; Seated piriformis x 2 bil;  Neuromuscular Re-education: Manual Therapy: long leg distraction to L hip; IASTM to distal ITB Modalities:    09/16/22: Therapeutic Exercise: Aerobic: Bike L1 x 7 min;  Supine:  Bridging 2 x 5 ; pelvic tilts  Seated:  sit to stand x 10( no UE support)  with education on mechanics for back and LEs, and core activation.  S/L:  Hip abd 2 x 10; clams 2 x 10;  Standing:  Hip abd and ext :  2 x 10 bil;  Stretches:  Seated pelvic tilts x 20 ;Seated HSS 30 sec x 3 bil;  Neuromuscular Re-education: Manual Therapy: long leg distraction to L hip;  Modalities:    PATIENT EDUCATION:  Education details: Reviewed HEP Person educated: Patient Education method: Explanation, Demonstration, Tactile cues, Verbal cues, and Handouts Education comprehension: verbalized understanding, returned demonstration, verbal cues required, tactile cues required, and needs further education  HOME EXERCISE PROGRAM Access Code: [email or web address]   ASSESSMENT:  CLINICAL IMPRESSION: 09/26/2022 Continued focus on hip stabilization and strengthening today. Pt with improved  ability for L glute activation today .  Will continue to benefit from strengthening. Does have much less pain today. Still mild tenderness in ITB, but improving.   Eval: Patient presents with primary complaint of increased pain in L leg and hip. She has had ongoing deficits in back as well. She has soreness in gr trochanter, glute, and at distal ITB. She has weakness in hip and has had ongoing pain since August. She has decreased ability for sustaining standing activity, walking, and functional activity. Pt to benefit from skilled PT to improve deficits and pain.   OBJECTIVE IMPAIRMENTS: Abnormal gait, decreased activity tolerance, decreased knowledge of use of DME, decreased mobility, difficulty walking, decreased ROM, decreased strength, increased muscle spasms, improper body mechanics, and pain.   ACTIVITY LIMITATIONS: carrying, lifting, bending, standing, squatting, stairs, transfers, and locomotion level  PARTICIPATION LIMITATIONS: cleaning, laundry, shopping, and community activity  PERSONAL FACTORS: Time since onset of injury/illness/exacerbation are also affecting patient's functional outcome.   REHAB POTENTIAL: Good  CLINICAL DECISION MAKING: Stable/uncomplicated  EVALUATION COMPLEXITY: Low   GOALS: Goals reviewed with patient? Yes  SHORT TERM GOALS: Target date: 09/17/2022    Pt to be independent with initial HEP  Goal status: MET  2.  Pt to report decreased pain  in ITB to 0-3/10  Goal status: MET   LONG TERM GOALS: Target date: 10/29/2022  Pt to be independent with final HEP   Goal status: INITIAL  2.  Pt to report decreased pain in hip and LE to 0-3/10 with standing activity.   Goal status: INITIAL  3.  Pt to demo increased strength of L hip and LE to at least 4+/5 to improve ability for stairs and gait.   Goal status: INITIAL  4.  Pt to report ability for standing activity for at least 30 min with pain 0-3/10.    Goal status: INITIAL   PLAN:  PT  FREQUENCY: 1-2x/week  PT DURATION: 8 weeks  PLANNED INTERVENTIONS: Therapeutic exercises, Therapeutic activity, Neuromuscular re-education, Balance training, Gait training, Patient/Family education, Self Care, Joint mobilization, Joint manipulation, Stair training, DME instructions, Aquatic Therapy, Dry Needling, Electrical stimulation, Spinal manipulation, Spinal mobilization, Cryotherapy, Moist heat, Taping, Traction, Ultrasound, Ionotophoresis '4mg'$ /ml Dexamethasone, and Manual therapy.  PLAN FOR NEXT SESSION:  Hip extension activation, core strength.    Lyndee Hensen, PT, DPT 10:33 AM  09/26/22

## 2022-09-30 ENCOUNTER — Ambulatory Visit: Payer: Medicare HMO | Admitting: Physical Therapy

## 2022-09-30 ENCOUNTER — Encounter: Payer: Self-pay | Admitting: Physical Therapy

## 2022-09-30 DIAGNOSIS — M5459 Other low back pain: Secondary | ICD-10-CM

## 2022-09-30 DIAGNOSIS — M25552 Pain in left hip: Secondary | ICD-10-CM | POA: Diagnosis not present

## 2022-09-30 NOTE — Therapy (Signed)
OUTPATIENT PHYSICAL THERAPY THORACOLUMBAR TREATMENT   Patient Name: Emily [name] MRN: [id number] DOB:1941/03/01, 82 y.o., female Today's Date: 09/30/2022  END OF SESSION:  PT End of Session - 09/30/22 1100     Visit Number 9    Number of Visits 16    Date for PT Re-Evaluation 10/29/22    Authorization Type [employer]    PT Start Time 1100    PT Stop Time [id number]    PT Time Calculation (min) 44 min    Activity Tolerance Patient tolerated treatment well    Behavior During Therapy WFL for tasks assessed/performed                 Past Medical History:  Diagnosis Date   Actinic keratosis    Allergic rhinitis    BACK PAIN    CARCINOMA, BREAST, 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   L s/p lumpectomy, chemo and XRT   Cervicalgia    Cholelithiasis    DIVERTICULOSIS, COLON    ENDOMETRIOSIS    GERD (gastroesophageal reflux disease)    Heart palpitations    Hypertension    HYPOTHYROIDISM    Left wrist fracture 11/06/2016   10/2016 - fell of high stool   Osteoporosis    Personal history of chemotherapy    Personal history of radiation therapy    Pleural plaque without asbestos    CT chest 10/2010: R>L lobes, upper> lower   Squamous cell carcinoma    History of BC   Past Surgical History:  Procedure Laterality Date   BREAST LUMPECTOMY  2001   left   CATARACT EXTRACTION Bilateral 09/2014   both eyes   CHOLECYSTECTOMY  2010   ENDOMETRIAL ABLATION     EXCISION / BIOPSY BREAST / NIPPLE / DUCT Left 2001   Patient Active Problem List   Diagnosis Date Noted   Chronic pain of left knee 08/21/2022   Lumbar radiculopathy 03/25/2022   Cough 12/27/2021   Sleep difficulties 09/27/2021   Cervicogenic headache 04/10/2021   Acute bronchitis 04/10/2021   Elevated blood pressure reading 04/10/2021   Skin abnormality 02/27/2021   Aortic atherosclerosis ([place]) 09/22/2020   Urinary urgency 03/23/2020   CKD (chronic kidney disease) stage 3, GFR 30-59 ml/min (HCC) 03/22/2020    Hyperglycemia 03/22/2020   Change in stool 11/17/2019   Right-sided chest wall pain 11/17/2019   Urinary retention 09/23/2019   SOB (shortness of breath) 01/15/2019   Upper airway cough syndrome 01/15/2019   Belching 12/14/2018   Sinus infection 12/14/2018   Reactive airway disease 12/05/2018   Tear of left rotator cuff 10/05/2018   Pre-syncope 05/19/2018   DDD (degenerative disc disease), cervical 01/15/2018   Neuralgia 09/12/2017   Presbycusis of both ears 12/11/2016   Memory difficulties 11/06/2016   IBS (irritable bowel syndrome) 04/16/2016   Hyperlipidemia 09/10/2015   Osteoporosis 09/08/2015   Palpitations    Pleural plaque without asbestos 12/03/2010   Chronic neck pain 12/18/2009   DIVERTICULOSIS, COLON 03/02/2009   Lumbago 03/11/2008   Actinic keratosis 01/23/2008   CARCINOMA, BREAST, ESTROGEN RECEPTOR NEGATIVE 10/12/2007   Hypothyroidism 06/18/2007   Allergic rhinitis 06/11/2007    PCP: Billey Gosling  REFERRING PROVIDER: Lynne Leader   REFERRING DIAG:   Rationale for Evaluation and Treatment: Rehabilitation  THERAPY DIAG:  Other low back pain  Pain in left hip  ONSET DATE:   SUBJECTIVE:  SUBJECTIVE STATEMENT:  09/30/2022  Pt states no pain in hip/leg. Mild soreness in low back at times.   Eval:  Pt states pain in L leg since August.  Had epidural in spine L5, no help. In September. Also had nerve block. Helped back pain, but still has leg pain.  Also has seen Ortho, now with IT band pain.  Now seeing sports met. She also had coristone shot in hip 1/9.  Having to use cane at times for pain, was using walker when it was really bad.  Can't put pressure on leg. Feels better at night, sleeping through night. Feels back to baseline today after hip injection.  Did not have much  PT for this yet.  Most pain with standing/walking and with initial standing.   PERTINENT HISTORY:   PAIN:  Are you having pain? Yes: NPRS scale: 0-2/10 Pain location: low back/sacrum Pain description: sore, very painful Aggravating factors: sitting  Relieving factors: none stated  PRECAUTIONS: None  WEIGHT BEARING RESTRICTIONS: No  FALLS:  Has patient fallen in last 6 months? No   OCCUPATION:   PLOF: Independent  PATIENT GOALS: decreased pain in leg.    OBJECTIVE:   DIAGNOSTIC FINDINGS:    PATIENT SURVEYS:  FOTO 28   COGNITION: Overall cognitive status: Within functional limits for tasks assessed     SENSATION: WFL  POSTURE:   PALPATION: Tenderness in distal ITB, gr troch, into glute.    LUMBAR ROM:   AROM eval  Flexion wfl  Extension Mod limitation  Right lateral flexion Mild limitation  Left lateral flexion Mild limitation  Right rotation   Left rotation    (Blank rows = not tested)  LOWER EXTREMITY MMT   MMT Right eval Left eval  Hip flexion 4 4-  Hip extension    Hip abduction 4 4-  Hip adduction    Hip internal rotation    Hip external rotation    Knee flexion  4+  Knee extension  4+  Ankle dorsiflexion    Ankle plantarflexion    Ankle inversion    Ankle eversion     (Blank rows = not tested)   LUMBAR SPECIAL TESTS:    TODAY'S TREATMENT:                                                                                                                              DATE:   09/30/22: Therapeutic Exercise: Aerobic: Bike L1 x 8 min  ( towel behind back )  Supine:  Bridging 1 x 10;  Seated:   S/L:   Standing:   Step ups fwd/6in x 10 bil;  Sit to stand x 10, education and practice for optimal posture with bend, lift, squat for IADLs.  Hip abd and ext 2 x 10 ea bil;  Stretches:   LTR x 10; seated piriformis x 3 min; Neuromuscular Re-education: Manual Therapy:   Modalities:    09/26/22: Therapeutic Exercise: Aerobic: Bike L2  x  3 min; L 1 x 3 min (use towel behind back next time)  Supine:   Seated:  sit to stand x 10 ( no UE support) GTB at thighs,  with TA;  LAQ 3lb 2x 10 on L;   S/L:   hip abd 2 x 10;  Prone: hip ext x 10 bil; pillow under stomach Standing:   Bwd walking for glute activation/slow x 4 in hallway; Step ups fwd/6in x 10 bil; 4 in lateral x 10 on L;  Stretches:   LTR x 10; DKTC 30 sec x 3;  Neuromuscular Re-education: Manual Therapy:   Modalities:    09/23/22: Therapeutic Exercise: Aerobic: Bike L1 x 8 min;  Supine:  Alternating clams x 15 GTB with tA;  Seated:  sit to stand x 10 ( no UE support) GTB at thighs,  with TA;  LAQ 3lb 2x 10 on L;  [place] GTB x15 on L;  S/L:    Prone: hip ext x 10 bil; pillow under stomach Standing:  Hip abd and ext :  2 x 10 ea bil; Marching x 15 with TA;  Bwd walking for glute activation/slow x 2 in hallway.  Stretches:   Neuromuscular Re-education: Manual Therapy:   Modalities:    PATIENT EDUCATION:  Education details: Reviewed HEP Person educated: Patient Education method: Explanation, Demonstration, Tactile cues, Verbal cues, and Handouts Education comprehension: verbalized understanding, returned demonstration, verbal cues required, tactile cues required, and needs further education  HOME EXERCISE PROGRAM Access Code: [email or web address]   ASSESSMENT:  CLINICAL IMPRESSION: 09/30/2022 Continued focus on hip stabilization and strengthening today. Will continue to benefit from strengthening. Pt requires cuing for mechanics with exercises. Plan to finalize HEP in next 1-2 visits, and will work towards d/c if she continues to be pain free.    Eval: Patient presents with primary complaint of increased pain in L leg and hip. She has had ongoing deficits in back as well. She has soreness in gr trochanter, glute, and at distal ITB. She has weakness in hip and has had ongoing pain since August. She has decreased ability for sustaining standing activity, walking, and  functional activity. Pt to benefit from skilled PT to improve deficits and pain.   OBJECTIVE IMPAIRMENTS: Abnormal gait, decreased activity tolerance, decreased knowledge of use of DME, decreased mobility, difficulty walking, decreased ROM, decreased strength, increased muscle spasms, improper body mechanics, and pain.   ACTIVITY LIMITATIONS: carrying, lifting, bending, standing, squatting, stairs, transfers, and locomotion level  PARTICIPATION LIMITATIONS: cleaning, laundry, shopping, and community activity  PERSONAL FACTORS: Time since onset of injury/illness/exacerbation are also affecting patient's functional outcome.   REHAB POTENTIAL: Good  CLINICAL DECISION MAKING: Stable/uncomplicated  EVALUATION COMPLEXITY: Low   GOALS: Goals reviewed with patient? Yes  SHORT TERM GOALS: Target date: 09/17/2022    Pt to be independent with initial HEP  Goal status: MET  2.  Pt to report decreased pain in ITB to 0-3/10  Goal status: MET   LONG TERM GOALS: Target date: 10/29/2022  Pt to be independent with final HEP   Goal status: INITIAL  2.  Pt to report decreased pain in hip and LE to 0-3/10 with standing activity.   Goal status: INITIAL  3.  Pt to demo increased strength of L hip and LE to at least 4+/5 to improve ability for stairs and gait.   Goal status: INITIAL  4.  Pt to report ability for standing activity for at least 30 min with pain 0-3/10.  Goal status: INITIAL   PLAN:  PT FREQUENCY: 1-2x/week  PT DURATION: 8 weeks  PLANNED INTERVENTIONS: Therapeutic exercises, Therapeutic activity, Neuromuscular re-education, Balance training, Gait training, Patient/Family education, Self Care, Joint mobilization, Joint manipulation, Stair training, DME instructions, Aquatic Therapy, Dry Needling, Electrical stimulation, Spinal manipulation, Spinal mobilization, Cryotherapy, Moist heat, Taping, Traction, Ultrasound, Ionotophoresis 2m/ml Dexamethasone, and Manual  therapy.  PLAN FOR NEXT SESSION:  Hip extension activation, core strength.    LLyndee Hensen PT, DPT 11:44 AM  09/30/22

## 2022-10-02 ENCOUNTER — Ambulatory Visit: Payer: Medicare HMO | Admitting: Physical Therapy

## 2022-10-02 ENCOUNTER — Encounter: Payer: Self-pay | Admitting: Physical Therapy

## 2022-10-02 DIAGNOSIS — M5459 Other low back pain: Secondary | ICD-10-CM

## 2022-10-02 DIAGNOSIS — M25571 Pain in right ankle and joints of right foot: Secondary | ICD-10-CM | POA: Diagnosis not present

## 2022-10-02 DIAGNOSIS — M25552 Pain in left hip: Secondary | ICD-10-CM | POA: Diagnosis not present

## 2022-10-02 NOTE — Therapy (Signed)
OUTPATIENT PHYSICAL THERAPY THORACOLUMBAR TREATMENT/Progress Note   Patient Name: Emily [name] MRN: [id number] DOB:1941/07/18, 82 y.o., female Today's Date: 10/02/2022   Physical Therapy Progress Note  Dates of Reporting Period:  09/03/22  to  10/02/22   END OF SESSION:  PT End of Session - 10/02/22 0806     Visit Number 10    Number of Visits 16    Date for PT Re-Evaluation 10/29/22    Authorization Type [employer] Medicare    PT Start Time 0805    PT Stop Time 0845    PT Time Calculation (min) 40 min    Activity Tolerance Patient tolerated treatment well    Behavior During Therapy WFL for tasks assessed/performed                 Past Medical History:  Diagnosis Date   Actinic keratosis    Allergic rhinitis    BACK PAIN    CARCINOMA, BREAST, 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   L s/p lumpectomy, chemo and XRT   Cervicalgia    Cholelithiasis    DIVERTICULOSIS, COLON    ENDOMETRIOSIS    GERD (gastroesophageal reflux disease)    Heart palpitations    Hypertension    HYPOTHYROIDISM    Left wrist fracture 11/06/2016   10/2016 - fell of high stool   Osteoporosis    Personal history of chemotherapy    Personal history of radiation therapy    Pleural plaque without asbestos    CT chest 10/2010: R>L lobes, upper> lower   Squamous cell carcinoma    History of BC   Past Surgical History:  Procedure Laterality Date   BREAST LUMPECTOMY  2001   left   CATARACT EXTRACTION Bilateral 09/2014   both eyes   CHOLECYSTECTOMY  2010   ENDOMETRIAL ABLATION     EXCISION / BIOPSY BREAST / NIPPLE / DUCT Left 2001   Patient Active Problem List   Diagnosis Date Noted   Chronic pain of left knee 08/21/2022   Lumbar radiculopathy 03/25/2022   Cough 12/27/2021   Sleep difficulties 09/27/2021   Cervicogenic headache 04/10/2021   Acute bronchitis 04/10/2021   Elevated blood pressure reading 04/10/2021   Skin abnormality 02/27/2021   Aortic atherosclerosis ([place]) 09/22/2020    Urinary urgency 03/23/2020   CKD (chronic kidney disease) stage 3, GFR 30-59 ml/min (HCC) 03/22/2020   Hyperglycemia 03/22/2020   Change in stool 11/17/2019   Right-sided chest wall pain 11/17/2019   Urinary retention 09/23/2019   SOB (shortness of breath) 01/15/2019   Upper airway cough syndrome 01/15/2019   Belching 12/14/2018   Sinus infection 12/14/2018   Reactive airway disease 12/05/2018   Tear of left rotator cuff 10/05/2018   Pre-syncope 05/19/2018   DDD (degenerative disc disease), cervical 01/15/2018   Neuralgia 09/12/2017   Presbycusis of both ears 12/11/2016   Memory difficulties 11/06/2016   IBS (irritable bowel syndrome) 04/16/2016   Hyperlipidemia 09/10/2015   Osteoporosis 09/08/2015   Palpitations    Pleural plaque without asbestos 12/03/2010   Chronic neck pain 12/18/2009   DIVERTICULOSIS, COLON 03/02/2009   Lumbago 03/11/2008   Actinic keratosis 01/23/2008   CARCINOMA, BREAST, ESTROGEN RECEPTOR NEGATIVE 10/12/2007   Hypothyroidism 06/18/2007   Allergic rhinitis 06/11/2007    PCP: Billey Gosling  REFERRING PROVIDER: Lynne Leader   REFERRING DIAG:   Rationale for Evaluation and Treatment: Rehabilitation  THERAPY DIAG:  Other low back pain  Pain in left hip  Pain in right ankle and joints of right foot  ONSET DATE:   SUBJECTIVE:                                                                                                                                                                                           SUBJECTIVE STATEMENT: 10/02/2022  Pt states no pain in hip/leg. Neck has been sore.    Eval:  Pt states pain in L leg since August.  Had epidural in spine L5, no help. In September. Also had nerve block. Helped back pain, but still has leg pain.  Also has seen Ortho, now with IT band pain.  Now seeing sports met. She also had coristone shot in hip 1/9.  Having to use cane at times for pain, was using walker when it was really bad.  Can't put  pressure on leg. Feels better at night, sleeping through night. Feels back to baseline today after hip injection.  Did not have much PT for this yet.  Most pain with standing/walking and with initial standing.   PERTINENT HISTORY:   PAIN:  Are you having pain? Yes: NPRS scale: 0-2/10 Pain location: low back/sacrum Pain description: sore, very painful Aggravating factors: sitting  Relieving factors: none stated  PRECAUTIONS: None  WEIGHT BEARING RESTRICTIONS: No  FALLS:  Has patient fallen in last 6 months? No   OCCUPATION:   PLOF: Independent  PATIENT GOALS: decreased pain in leg.    OBJECTIVE:   DIAGNOSTIC FINDINGS:    PATIENT SURVEYS:  Eval: FOTO 53      10/02/22:  32   COGNITION: Overall cognitive status: Within functional limits for tasks assessed     SENSATION: WFL  POSTURE:   PALPATION: Tenderness in distal ITB, gr troch, into glute.    LUMBAR ROM:    10/02/22: hip ROM: WFL  AROM eval 2/14  Flexion wfl wfl  Extension Mod limitation Mild limitation  Right lateral flexion Mild limitation Mild limitation  Left lateral flexion Mild limitation Mild limitation  Right rotation    Left rotation     (Blank rows = not tested)   LOWER EXTREMITY MMT   MMT Right eval Left eval 2/14/L24  Hip flexion 4 4- 4+  Hip extension     Hip abduction 4 4- 4  Hip adduction     Hip internal rotation     Hip external rotation     Knee flexion  4+ 5  Knee extension  4+ 5  Ankle dorsiflexion     Ankle plantarflexion     Ankle inversion     Ankle eversion      (Blank rows = not tested)   TODAY'S TREATMENT:  DATE:   10/02/22: Therapeutic Exercise: Aerobic: Bike L1 x 7 min  ( towel behind back )  Supine:  Bridging 1 x 10;  Seated:  LAQ 2 x 10 bil;  Neck: ROM L/R rotation x 5 bil; chin tucks x 10, cervical nod x 10, education on self  Sub occipital stretch and postural awareness for neck/shoulders.  S/L:  Hip abd x 10 bil;  Standing:   Stretches:   LTR x 10; DKTC x 3;  Neuromuscular Re-education: Manual Therapy:   Modalities:    09/26/22: Therapeutic Exercise: Aerobic: Bike L2 x 3 min; L 1 x 3 min (use towel behind back next time)  Supine:   Seated:  sit to stand x 10 ( no UE support) GTB at thighs,  with TA;  LAQ 3lb 2x 10 on L;   S/L:   hip abd 2 x 10;  Prone: hip ext x 10 bil; pillow under stomach Standing:   Bwd walking for glute activation/slow x 4 in hallway; Step ups fwd/6in x 10 bil; 4 in lateral x 10 on L;  Stretches:   LTR x 10; DKTC 30 sec x 3;  Neuromuscular Re-education: Manual Therapy:   Modalities:     PATIENT EDUCATION:  Education details: Reviewed HEP Person educated: Patient Education method: Explanation, Demonstration, Tactile cues, Verbal cues, and Handouts Education comprehension: verbalized understanding, returned demonstration, verbal cues required, tactile cues required, and needs further education  HOME EXERCISE PROGRAM Access Code: [id number]   ASSESSMENT:  CLINICAL IMPRESSION:  10/02/2022:  PN:  Review of HEP today in detail, will continue to finalize standing exercises next visit. Likely d/c next visit. Pt doing very well at this time with LE pain. She has improved ability for strengthening and functional activity. Her neck has been painful, reviewed positioning/posture for neck today, she is seeing MD next week, she will discuss neck pain with him as well.   Eval: Patient presents with primary complaint of increased pain in L leg and hip. She has had ongoing deficits in back as well. She has soreness in gr trochanter, glute, and at distal ITB. She has weakness in hip and has had ongoing pain since August. She has decreased ability for sustaining standing activity, walking, and functional activity. Pt to benefit from skilled PT to improve deficits and pain.   OBJECTIVE  IMPAIRMENTS: Abnormal gait, decreased activity tolerance, decreased knowledge of use of DME, decreased mobility, difficulty walking, decreased ROM, decreased strength, increased muscle spasms, improper body mechanics, and pain.   ACTIVITY LIMITATIONS: carrying, lifting, bending, standing, squatting, stairs, transfers, and locomotion level  PARTICIPATION LIMITATIONS: cleaning, laundry, shopping, and community activity  PERSONAL FACTORS: Time since onset of injury/illness/exacerbation are also affecting patient's functional outcome.   REHAB POTENTIAL: Good  CLINICAL DECISION MAKING: Stable/uncomplicated  EVALUATION COMPLEXITY: Low   GOALS: Goals reviewed with patient? Yes  SHORT TERM GOALS: Target date: 09/17/2022   Pt to be independent with initial HEP  Goal status: MET  2.  Pt to report decreased pain in ITB to 0-3/10  Goal status: MET   LONG TERM GOALS: Target date: 10/29/2022  Pt to be independent with final HEP   Goal status: IN PROGRESS  2.  Pt to report decreased pain in hip and LE to 0-3/10 with standing activity.   Goal status: MET  3.  Pt to demo increased strength of L hip and LE to at least 4+/5 to improve ability for stairs and gait.   Goal status: MET  4.  Pt to report ability for standing activity for at least 30 min with pain 0-3/10.    Goal status: MET   PLAN:  PT FREQUENCY: 1-2x/week  PT DURATION: 8 weeks  PLANNED INTERVENTIONS: Therapeutic exercises, Therapeutic activity, Neuromuscular re-education, Balance training, Gait training, Patient/Family education, Self Care, Joint mobilization, Joint manipulation, Stair training, DME instructions, Aquatic Therapy, Dry Needling, Electrical stimulation, Spinal manipulation, Spinal mobilization, Cryotherapy, Moist heat, Taping, Traction, Ultrasound, Ionotophoresis 66m/ml Dexamethasone, and Manual therapy.  PLAN FOR NEXT SESSION:  review standing exercises for HEP, review mechanics for IADLS and back pain.     LLyndee Hensen PT, DPT 9:53 AM  10/02/22

## 2022-10-07 NOTE — Progress Notes (Unsigned)
   I, Peterson Lombard, LAT, ATC acting as a scribe for Lynne Leader, MD.  Emily [name] is a 82 y.o. female who presents to [place] at [facility] today for 6-wk f/u L hip and low back pain. Pt was last seen by Dr. Georgina Snell on 08/27/22 and was given a L GT steroid injection, was prescribed gabapentin, and referred to PT, completing 10 visits. Today, pt reports  Dx imaging: 04/02/22 L-spine XR  Pertinent review of systems: ***  Relevant historical information: ***   Exam:  There were no vitals taken for this visit. General: Well Developed, well nourished, and in no acute distress.   MSK: ***    Lab and Radiology Results No results found for this or any previous visit (from the past 72 hour(s)). No results found.     Assessment and Plan: 82 y.o. female with ***   PDMP not reviewed this encounter. No orders of the defined types were placed in this encounter.  No orders of the defined types were placed in this encounter.    Discussed warning signs or symptoms. Please see discharge instructions. Patient expresses understanding.   ***

## 2022-10-08 ENCOUNTER — Ambulatory Visit: Payer: Medicare HMO | Admitting: Family Medicine

## 2022-10-08 VITALS — BP 128/82 | HR 60 | Ht 61.0 in | Wt 124.0 lb

## 2022-10-08 DIAGNOSIS — M5481 Occipital neuralgia: Secondary | ICD-10-CM | POA: Diagnosis not present

## 2022-10-08 DIAGNOSIS — M542 Cervicalgia: Secondary | ICD-10-CM

## 2022-10-08 NOTE — Patient Instructions (Addendum)
Thank you for coming in today.   Continue working on the exercises at home   I will get you back to PT for your neck and headache   Recheck with me in 6 weeks.   Keep me updated.   There are shots to do.

## 2022-10-09 ENCOUNTER — Encounter: Payer: Self-pay | Admitting: Physical Therapy

## 2022-10-09 ENCOUNTER — Ambulatory Visit: Payer: Medicare HMO | Admitting: Physical Therapy

## 2022-10-09 DIAGNOSIS — M542 Cervicalgia: Secondary | ICD-10-CM

## 2022-10-09 DIAGNOSIS — M25552 Pain in left hip: Secondary | ICD-10-CM

## 2022-10-09 DIAGNOSIS — M5459 Other low back pain: Secondary | ICD-10-CM

## 2022-10-09 NOTE — Therapy (Signed)
OUTPATIENT PHYSICAL THERAPY THORACOLUMBAR TREATMENT/Re-eval    Patient Name: Emily [name] MRN: [id number] DOB:[birth year], 82 y.o., female Today's Date: 10/09/2022   END OF SESSION:  PT End of Session - 10/09/22 1910     Visit Number 11   1st for neck   Number of Visits 20    Date for PT Re-Evaluation 10/29/22    Authorization Type [employer]   10 previous for Back/hip    PT Start Time 0850    PT Stop Time 0930    PT Time Calculation (min) 40 min    Activity Tolerance Patient tolerated treatment well    Behavior During Therapy WFL for tasks assessed/performed                  Past Medical History:  Diagnosis Date   Actinic keratosis    Allergic rhinitis    BACK PAIN    CARCINOMA, BREAST, 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   L s/p lumpectomy, chemo and XRT   Cervicalgia    Cholelithiasis    DIVERTICULOSIS, COLON    ENDOMETRIOSIS    GERD (gastroesophageal reflux disease)    Heart palpitations    Hypertension    HYPOTHYROIDISM    Left wrist fracture 11/06/2016   10/2016 - fell of high stool   Osteoporosis    Personal history of chemotherapy    Personal history of radiation therapy    Pleural plaque without asbestos    CT chest 10/2010: R>L lobes, upper> lower   Squamous cell carcinoma    History of BC   Past Surgical History:  Procedure Laterality Date   BREAST LUMPECTOMY  2001   left   CATARACT EXTRACTION Bilateral 09/2014   both eyes   CHOLECYSTECTOMY  2010   ENDOMETRIAL ABLATION     EXCISION / BIOPSY BREAST / NIPPLE / DUCT Left 2001   Patient Active Problem List   Diagnosis Date Noted   Chronic pain of left knee 08/21/2022   Lumbar radiculopathy 03/25/2022   Cough 12/27/2021   Sleep difficulties 09/27/2021   Cervicogenic headache 04/10/2021   Acute bronchitis 04/10/2021   Elevated blood pressure reading 04/10/2021   Skin abnormality 02/27/2021   Aortic atherosclerosis ([place]) 09/22/2020   Urinary urgency 03/23/2020   CKD (chronic  kidney disease) stage 3, GFR 30-59 ml/min (HCC) 03/22/2020   Hyperglycemia 03/22/2020   Change in stool 11/17/2019   Right-sided chest wall pain 11/17/2019   Urinary retention 09/23/2019   SOB (shortness of breath) 01/15/2019   Upper airway cough syndrome 01/15/2019   Belching 12/14/2018   Sinus infection 12/14/2018   Reactive airway disease 12/05/2018   Tear of left rotator cuff 10/05/2018   Pre-syncope 05/19/2018   DDD (degenerative disc disease), cervical 01/15/2018   Neuralgia 09/12/2017   Presbycusis of both ears 12/11/2016   Memory difficulties 11/06/2016   IBS (irritable bowel syndrome) 04/16/2016   Hyperlipidemia 09/10/2015   Osteoporosis 09/08/2015   Palpitations    Pleural plaque without asbestos 12/03/2010   Chronic neck pain 12/18/2009   DIVERTICULOSIS, COLON 03/02/2009   Lumbago 03/11/2008   Actinic keratosis 01/23/2008   CARCINOMA, BREAST, ESTROGEN RECEPTOR NEGATIVE 10/12/2007   Hypothyroidism 06/18/2007   Allergic rhinitis 06/11/2007    PCP: Billey Gosling  REFERRING PROVIDER: Lynne Leader   REFERRING DIAG: Neck pain  Rationale for Evaluation and Treatment: Rehabilitation  THERAPY DIAG:  Other low back pain  Pain in left hip  Cervicalgia  ONSET DATE:   SUBJECTIVE:  SUBJECTIVE STATEMENT: 10/09/2022  Eval for neck today:  Pt states no pain in hip/leg. Has been much better. She wishes to do new eval for neck today, and continue with HEP for hip/back.  Pt states ongoing pain in neck, for about 6 months. She states difficulty sleeping, pain in the morning, with headaches. She did have previous episodes of pain with this, last was about 3 years ago, PT was helpful at that time. States tightness in UT s , no pain or tingling into UEs. She also had previous injection in neck about 3  years ago.   Eval: hip/back Pt states pain in L leg since August.  Had epidural in spine L5, no help. In September. Also had nerve block. Helped back pain, but still has leg pain.  Also has seen Ortho, now with IT band pain.  Now seeing sports met. She also had coristone shot in hip 1/9.  Having to use cane at times for pain, was using walker when it was really bad.  Can't put pressure on leg. Feels better at night, sleeping through night. Feels back to baseline today after hip injection.  Did not have much PT for this yet.  Most pain with standing/walking and with initial standing.   PERTINENT HISTORY:   PAIN:  Are you having pain? Yes: NPRS scale: 0-2/10 Pain location: low back/sacrum Pain description: sore, very painful Aggravating factors: sitting  Relieving factors: none stated  Are you having pain? Yes: NPRS scale: 0-7/10 Pain location: neck Pain description: sore,tight Aggravating factors: sleeping, first thing in am, headaches from back of head.  Relieving factors: none stated  PRECAUTIONS: None  WEIGHT BEARING RESTRICTIONS: No  FALLS:  Has patient fallen in last 6 months? No   OCCUPATION:   PLOF: Independent  PATIENT GOALS: decreased pain in neck   OBJECTIVE:   DIAGNOSTIC FINDINGS:    PATIENT SURVEYS:  Back/hip: Eval: FOTO 53    10/02/22:  65   COGNITION: Overall cognitive status: Within functional limits for tasks assessed     SENSATION: WFL  POSTURE:   PALPATION: Tenderness in distal ITB, gr troch, into glute.    LUMBAR ROM:    10/09/22:  Neck ROM:  Flexion, Extension: WFL, mild pain with extension.  Rotaion: WFL , very mild limitation with pain at end range for R rotation.   10/02/22: hip ROM: WFL  AROM eval 2/14  Flexion wfl wfl  Extension Mod limitation Mild limitation  Right lateral flexion Mild limitation Mild limitation  Left lateral flexion Mild limitation Mild limitation  Right rotation    Left rotation     (Blank rows = not  tested)   LOWER EXTREMITY MMT   MMT Right eval Left eval 10/02/22  Hip flexion 4 4- 4+  Hip extension     Hip abduction 4 4- 4  Hip adduction     Hip internal rotation     Hip external rotation     Knee flexion  4+ 5  Knee extension  4+ 5  Ankle dorsiflexion     Ankle plantarflexion     Ankle inversion     Ankle eversion      (Blank rows = not tested)  UE Strength: 4+/5   TODAY'S TREATMENT:  DATE:   10/09/22: Therapeutic Exercise: Aerobic:   Supine:  Seated:   Neck: ROM L/R rotation x 5 bil; light chin tuck with flexion nod  x 15, shoulder rolls x 15; Scap squeeze x 10;   pelvic tilts x 10;  S/L:    Standing:   Stretches:    Neuromuscular Re-education: Manual Therapy:  light SOR, light manual distraction for SO stretch;  Modalities:    10/02/22: Therapeutic Exercise: Aerobic: Bike L1 x 7 min  ( towel behind back )  Supine:  Bridging 1 x 10;  Seated:  LAQ 2 x 10 bil;  Neck: ROM L/R rotation x 5 bil; chin tucks x 10, cervical nod x 10, education on self Sub occipital stretch and postural awareness for neck/shoulders.  S/L:  Hip abd x 10 bil;  Standing:   Stretches:   LTR x 10; DKTC x 3;  Neuromuscular Re-education: Manual Therapy:   Modalities:    09/26/22: Therapeutic Exercise: Aerobic: Bike L2 x 3 min; L 1 x 3 min (use towel behind back next time)  Supine:   Seated:  sit to stand x 10 ( no UE support) GTB at thighs,  with TA;  LAQ 3lb 2x 10 on L;   S/L:   hip abd 2 x 10;  Prone: hip ext x 10 bil; pillow under stomach Standing:   Bwd walking for glute activation/slow x 4 in hallway; Step ups fwd/6in x 10 bil; 4 in lateral x 10 on L;  Stretches:   LTR x 10; DKTC 30 sec x 3;  Neuromuscular Re-education: Manual Therapy:   Modalities:     PATIENT EDUCATION:  Education details: Reviewed HEP for neck, discussed pillow height for neck  pain.  Person educated: Patient Education method: Explanation, Demonstration, Tactile cues, Verbal cues, and Handouts Education comprehension: verbalized understanding, returned demonstration, verbal cues required, tactile cues required, and needs further education  HOME EXERCISE PROGRAM Access Code: [email or web address] Access Code: [email or web address]  Neck.     ASSESSMENT:  CLINICAL IMPRESSION:  10/09/2022:   Pt presents with primary complaint of increased pain in neck. New referral and eval for neck today. She has increased muscle tension and soreness mostly in cervical paraspinals, and sub occipital region. She also has muscle tension in UT s. She is having decreased sleep and increased headaches due to pain. Pt to benefit from skilled PT to improve deficits and pain. Discussed pillow height and position today. Her hip and back are doing very well at this time, pt wishes to focus on neck. Will review lumbar/hip exercises as/if needed, but cervical dx will be primary.   Eval: Patient presents with primary complaint of increased pain in L leg and hip. She has had ongoing deficits in back as well. She has soreness in gr trochanter, glute, and at distal ITB. She has weakness in hip and has had ongoing pain since August. She has decreased ability for sustaining standing activity, walking, and functional activity. Pt to benefit from skilled PT to improve deficits and pain.   OBJECTIVE IMPAIRMENTS: Abnormal gait, decreased activity tolerance, decreased knowledge of use of DME, decreased mobility, difficulty walking, decreased ROM, decreased strength, increased muscle spasms, improper body mechanics, and pain.   ACTIVITY LIMITATIONS: carrying, lifting, bending, standing, squatting, sleeping, stairs, transfers, and locomotion level  PARTICIPATION LIMITATIONS: cleaning, laundry, shopping, and community activity  PERSONAL FACTORS: Time since onset of injury/illness/exacerbation are also affecting patient's functional  outcome.   REHAB POTENTIAL: Good  CLINICAL DECISION MAKING: Stable/uncomplicated  EVALUATION  COMPLEXITY: Low   GOALS: Goals reviewed with patient? Yes  SHORT TERM GOALS: Target date: 09/17/2022 Pt to be independent with initial HEP Goal status: MET 2.  Pt to report decreased pain in ITB to 0-3/10 Goal status: MET LONG TERM GOALS: Target date: 10/29/2022 Pt to be independent with final HEP Goal status: IN PROGRESS 2.  Pt to report decreased pain in hip and LE to 0-3/10 with standing activity.  Goal status: MET 3.  Pt to demo increased strength of L hip and LE to at least 4+/5 to improve ability for stairs and gait.  Goal status: MET 4.  Pt to report ability for standing activity for at least 30 min with pain 0-3/10.  Goal status: MET   New Neck goals 10/09/22: STG:  1: Pt to be independent with initial HEP   Goal status: INITIAL  2: Pt to report taking postural breaks during long card games  Goal status: INITIAL LTG: 1: Pt to be independent with final HEP  Goal status: INITIAL  2: Pt to report decreased pain in neck to 0-2/10 with sleeping and activity.   Goal status: INITIAL  3. Pt to demo cervical ROM for all directions to be [facility] and pain free.   Goal status: INITIAL  PLAN:  PT FREQUENCY: 1-2x/week  PT DURATION: 8 weeks  PLANNED INTERVENTIONS: Therapeutic exercises, Therapeutic activity, Neuromuscular re-education, Balance training, Gait training, Patient/Family education, Self Care, Joint mobilization, Joint manipulation, Stair training, DME instructions, Aquatic Therapy, Dry Needling, Electrical stimulation, Spinal manipulation, Spinal mobilization, Cryotherapy, Moist heat, Taping, Traction, Ultrasound, Ionotophoresis 26m/ml Dexamethasone, and Manual therapy.  PLAN FOR NEXT SESSION:     LLyndee Hensen PT, DPT 7:12 PM  10/09/22

## 2022-10-15 ENCOUNTER — Ambulatory Visit: Payer: Medicare HMO | Admitting: Physical Therapy

## 2022-10-15 ENCOUNTER — Encounter: Payer: Self-pay | Admitting: Physical Therapy

## 2022-10-15 DIAGNOSIS — M25552 Pain in left hip: Secondary | ICD-10-CM | POA: Diagnosis not present

## 2022-10-15 DIAGNOSIS — M542 Cervicalgia: Secondary | ICD-10-CM

## 2022-10-15 DIAGNOSIS — M5459 Other low back pain: Secondary | ICD-10-CM

## 2022-10-15 NOTE — Therapy (Signed)
OUTPATIENT PHYSICAL THERAPY TREATMENT   Patient Name: Emily [name] MRN: [id number] DOB:Mar 29, 1941, 82 y.o., female Today's Date: 10/15/2022   END OF SESSION:  PT End of Session - 10/15/22 0848     Visit Number 12    Number of Visits 20    Date for PT Re-Evaluation 10/29/22    Authorization Type [employer] Medicare   10 previous for Back/hip    PT Start Time 0848    PT Stop Time 0930    PT Time Calculation (min) 42 min    Activity Tolerance Patient tolerated treatment well    Behavior During Therapy WFL for tasks assessed/performed                   Past Medical History:  Diagnosis Date   Actinic keratosis    Allergic rhinitis    BACK PAIN    CARCINOMA, BREAST, 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   L s/p lumpectomy, chemo and XRT   Cervicalgia    Cholelithiasis    DIVERTICULOSIS, COLON    ENDOMETRIOSIS    GERD (gastroesophageal reflux disease)    Heart palpitations    Hypertension    HYPOTHYROIDISM    Left wrist fracture 11/06/2016   10/2016 - fell of high stool   Osteoporosis    Personal history of chemotherapy    Personal history of radiation therapy    Pleural plaque without asbestos    CT chest 10/2010: R>L lobes, upper> lower   Squamous cell carcinoma    History of BC   Past Surgical History:  Procedure Laterality Date   BREAST LUMPECTOMY  2001   left   CATARACT EXTRACTION Bilateral 09/2014   both eyes   CHOLECYSTECTOMY  2010   ENDOMETRIAL ABLATION     EXCISION / BIOPSY BREAST / NIPPLE / DUCT Left 2001   Patient Active Problem List   Diagnosis Date Noted   Chronic pain of left knee 08/21/2022   Lumbar radiculopathy 03/25/2022   Cough 12/27/2021   Sleep difficulties 09/27/2021   Cervicogenic headache 04/10/2021   Acute bronchitis 04/10/2021   Elevated blood pressure reading 04/10/2021   Skin abnormality 02/27/2021   Aortic atherosclerosis ([place]) 09/22/2020   Urinary urgency 03/23/2020   CKD (chronic kidney disease) stage 3, GFR 30-59  ml/min (HCC) 03/22/2020   Hyperglycemia 03/22/2020   Change in stool 11/17/2019   Right-sided chest wall pain 11/17/2019   Urinary retention 09/23/2019   SOB (shortness of breath) 01/15/2019   Upper airway cough syndrome 01/15/2019   Belching 12/14/2018   Sinus infection 12/14/2018   Reactive airway disease 12/05/2018   Tear of left rotator cuff 10/05/2018   Pre-syncope 05/19/2018   DDD (degenerative disc disease), cervical 01/15/2018   Neuralgia 09/12/2017   Presbycusis of both ears 12/11/2016   Memory difficulties 11/06/2016   IBS (irritable bowel syndrome) 04/16/2016   Hyperlipidemia 09/10/2015   Osteoporosis 09/08/2015   Palpitations    Pleural plaque without asbestos 12/03/2010   Chronic neck pain 12/18/2009   DIVERTICULOSIS, COLON 03/02/2009   Lumbago 03/11/2008   Actinic keratosis 01/23/2008   CARCINOMA, BREAST, ESTROGEN RECEPTOR NEGATIVE 10/12/2007   Hypothyroidism 06/18/2007   Allergic rhinitis 06/11/2007    PCP: Billey Gosling  REFERRING PROVIDER: Lynne Leader   REFERRING DIAG: Neck pain  Rationale for Evaluation and Treatment: Rehabilitation  THERAPY DIAG:  Cervicalgia  Other low back pain  Pain in left hip  ONSET DATE:   SUBJECTIVE:  SUBJECTIVE STATEMENT:  10/15/2022  States little pain during the day. Has most pain at night and in the morning. Lots of pain last night with headache.   Eval for neck today:  Pt states no pain in hip/leg. Has been much better. She wishes to do new eval for neck today, and continue with HEP for hip/back.  Pt states ongoing pain in neck, for about 6 months. She states difficulty sleeping, pain in the morning, with headaches. She did have previous episodes of pain with this, last was about 3 years ago, PT was helpful at that time. States tightness  in UT s , no pain or tingling into UEs. She also had previous injection in neck about 3 years ago.   Eval: hip/back Pt states pain in L leg since August.  Had epidural in spine L5, no help. In September. Also had nerve block. Helped back pain, but still has leg pain.  Also has seen Ortho, now with IT band pain.  Now seeing sports met. She also had coristone shot in hip 1/9.  Having to use cane at times for pain, was using walker when it was really bad.  Can't put pressure on leg. Feels better at night, sleeping through night. Feels back to baseline today after hip injection.  Did not have much PT for this yet.  Most pain with standing/walking and with initial standing.   PERTINENT HISTORY:   PAIN:  Are you having pain? Yes: NPRS scale: 0-2/10 Pain location: low back/sacrum Pain description: sore, very painful Aggravating factors: sitting  Relieving factors: none stated  Are you having pain? Yes: NPRS scale: 0-7/10 Pain location: neck Pain description: sore,tight Aggravating factors: sleeping, first thing in am, headaches from back of head.  Relieving factors: none stated  PRECAUTIONS: None  WEIGHT BEARING RESTRICTIONS: No  FALLS:  Has patient fallen in last 6 months? No   OCCUPATION:   PLOF: Independent  PATIENT GOALS: decreased pain in neck   OBJECTIVE:   DIAGNOSTIC FINDINGS:    PATIENT SURVEYS:  Back/hip: Eval: FOTO 53    10/02/22:  65   COGNITION: Overall cognitive status: Within functional limits for tasks assessed     SENSATION: WFL  POSTURE:   PALPATION: Tenderness in distal ITB, gr troch, into glute.    LUMBAR ROM:    10/09/22:  Neck ROM:  Flexion, Extension: WFL, mild pain with extension.  Rotaion: WFL , very mild limitation with pain at end range for R rotation.   10/02/22: hip ROM: WFL  AROM eval 2/14  Flexion wfl wfl  Extension Mod limitation Mild limitation  Right lateral flexion Mild limitation Mild limitation  Left lateral flexion Mild  limitation Mild limitation  Right rotation    Left rotation     (Blank rows = not tested)   LOWER EXTREMITY MMT   MMT Right eval Left eval 10/02/22  Hip flexion 4 4- 4+  Hip extension     Hip abduction 4 4- 4  Hip adduction     Hip internal rotation     Hip external rotation     Knee flexion  4+ 5  Knee extension  4+ 5  Ankle dorsiflexion     Ankle plantarflexion     Ankle inversion     Ankle eversion      (Blank rows = not tested)  UE Strength: 4+/5   TODAY'S TREATMENT:  DATE:   10/15/22: Therapeutic Exercise: Aerobic:   Supine: Neck: ROM L/R rotation x 5 bil;  light chin tuck with flexion nod x 15, shoulder ext with scap squeeze  x 15;  Seated:  Chin tuck with flexion nod  for SO stretch x 10;  S/L:    Standing:  Scap squeeze x 10  Stretches:    Neuromuscular Re-education: Manual Therapy:  light SOR, manual distraction for SO stretch; Cervical ROM, UT stretching  Modalities: Moist heat pack to cervical , 10 min at end of session.    10/09/22: Therapeutic Exercise: Aerobic:   Supine:  Seated:   Neck: ROM L/R rotation x 5 bil; light chin tuck with flexion nod  x 15, shoulder rolls x 15; Scap squeeze x 10;   pelvic tilts x 10;  S/L:    Standing:   Stretches:    Neuromuscular Re-education: Manual Therapy:  light SOR, light manual distraction for SO stretch;  Modalities:    10/02/22: Therapeutic Exercise: Aerobic: Bike L1 x 7 min  ( towel behind back )  Supine:  Bridging 1 x 10;  Seated:  LAQ 2 x 10 bil;  Neck: ROM L/R rotation x 5 bil; chin tucks x 10, cervical nod x 10, education on self Sub occipital stretch and postural awareness for neck/shoulders.  S/L:  Hip abd x 10 bil;  Standing:   Stretches:   LTR x 10; DKTC x 3;  Neuromuscular Re-education: Manual Therapy:   Modalities:    09/26/22: Therapeutic Exercise: Aerobic: Bike L2  x 3 min; L 1 x 3 min (use towel behind back next time)  Supine:   Seated:  sit to stand x 10 ( no UE support) GTB at thighs,  with TA;  LAQ 3lb 2x 10 on L;   S/L:   hip abd 2 x 10;  Prone: hip ext x 10 bil; pillow under stomach Standing:   Bwd walking for glute activation/slow x 4 in hallway; Step ups fwd/6in x 10 bil; 4 in lateral x 10 on L;  Stretches:   LTR x 10; DKTC 30 sec x 3;  Neuromuscular Re-education: Manual Therapy:   Modalities:     PATIENT EDUCATION:  Education details: Reviewed HEP for neck, discussed pillow height for neck pain.  Person educated: Patient Education method: Explanation, Demonstration, Tactile cues, Verbal cues, and Handouts Education comprehension: verbalized understanding, returned demonstration, verbal cues required, tactile cues required, and needs further education  HOME EXERCISE PROGRAM Access Code: [email or web address] Access Code: [email or web address]  Neck.     ASSESSMENT:  CLINICAL IMPRESSION:  10/15/2022:   Pt with most tenderness in SO region and high cervical region. She has improved tension and soreness after session today. Reviewed doing cervical ROM in supine position vs seated for improved comfort.   Neck Eval: Pt presents with primary complaint of increased pain in neck. New referral and eval for neck today. She has increased muscle tension and soreness mostly in cervical paraspinals, and sub occipital region. She also has muscle tension in UT s. She is having decreased sleep and increased headaches due to pain. Pt to benefit from skilled PT to improve deficits and pain. Discussed pillow height and position today. Her hip and back are doing very well at this time, pt wishes to focus on neck. Will review lumbar/hip exercises as/if needed, but cervical dx will be primary.   Eval: Patient presents with primary complaint of increased pain in L leg and hip. She has had ongoing deficits in back  as well. She has soreness in gr trochanter, glute, and at distal ITB.  She has weakness in hip and has had ongoing pain since August. She has decreased ability for sustaining standing activity, walking, and functional activity. Pt to benefit from skilled PT to improve deficits and pain.   OBJECTIVE IMPAIRMENTS: Abnormal gait, decreased activity tolerance, decreased knowledge of use of DME, decreased mobility, difficulty walking, decreased ROM, decreased strength, increased muscle spasms, improper body mechanics, and pain.   ACTIVITY LIMITATIONS: carrying, lifting, bending, standing, squatting, sleeping, stairs, transfers, and locomotion level  PARTICIPATION LIMITATIONS: cleaning, laundry, shopping, and community activity  PERSONAL FACTORS: Time since onset of injury/illness/exacerbation are also affecting patient's functional outcome.   REHAB POTENTIAL: Good  CLINICAL DECISION MAKING: Stable/uncomplicated  EVALUATION COMPLEXITY: Low   GOALS: Goals reviewed with patient? Yes  SHORT TERM GOALS: Target date: 09/17/2022 Pt to be independent with initial HEP Goal status: MET 2.  Pt to report decreased pain in ITB to 0-3/10 Goal status: MET LONG TERM GOALS: Target date: 10/29/2022 Pt to be independent with final HEP Goal status: IN PROGRESS 2.  Pt to report decreased pain in hip and LE to 0-3/10 with standing activity.  Goal status: MET 3.  Pt to demo increased strength of L hip and LE to at least 4+/5 to improve ability for stairs and gait.  Goal status: MET 4.  Pt to report ability for standing activity for at least 30 min with pain 0-3/10.  Goal status: MET   New Neck goals 10/09/22: STG:  1: Pt to be independent with initial HEP   Goal status: INITIAL  2: Pt to report taking postural breaks during long card games  Goal status: INITIAL LTG: 1: Pt to be independent with final HEP  Goal status: INITIAL  2: Pt to report decreased pain in neck to 0-2/10 with sleeping and activity.   Goal status: INITIAL  3. Pt to demo cervical ROM for all  directions to be [facility] and pain free.   Goal status: INITIAL  PLAN:  PT FREQUENCY: 1-2x/week  PT DURATION: 8 weeks  PLANNED INTERVENTIONS: Therapeutic exercises, Therapeutic activity, Neuromuscular re-education, Balance training, Gait training, Patient/Family education, Self Care, Joint mobilization, Joint manipulation, Stair training, DME instructions, Aquatic Therapy, Dry Needling, Electrical stimulation, Spinal manipulation, Spinal mobilization, Cryotherapy, Moist heat, Taping, Traction, Ultrasound, Ionotophoresis '4mg'$ /ml Dexamethasone, and Manual therapy.  PLAN FOR NEXT SESSION:     Lyndee Hensen, PT, DPT 12:30 PM  10/15/22

## 2022-10-16 DIAGNOSIS — L820 Inflamed seborrheic keratosis: Secondary | ICD-10-CM | POA: Diagnosis not present

## 2022-10-17 ENCOUNTER — Encounter: Payer: Medicare HMO | Admitting: Physical Therapy

## 2022-10-21 NOTE — Progress Notes (Unsigned)
   I, Peterson Lombard, LAT, ATC acting as a scribe for Lynne Leader, MD.  Emily [name] is a 82 y.o. female who presents to [place] [facility] today for non-improving neck pain and HA, thought to be due to occipital neuralgia.  Patient was last seen by Dr. Georgina Snell on 10/08/2022 and was rereferred to PT, completing 12 total visits.  Today, patient reports ***   Pertinent review of systems: ***  Relevant historical information: ***   Exam:  There were no vitals taken for this visit. General: Well Developed, well nourished, and in no acute distress.   MSK: ***    Lab and Radiology Results No results found for this or any previous visit (from the past 72 hour(s)). No results found.     Assessment and Plan: 82 y.o. female with ***   PDMP not reviewed this encounter. No orders of the defined types were placed in this encounter.  No orders of the defined types were placed in this encounter.    Discussed warning signs or symptoms. Please see discharge instructions. Patient expresses understanding.   ***

## 2022-10-22 ENCOUNTER — Ambulatory Visit: Payer: Medicare HMO | Admitting: Family Medicine

## 2022-10-22 ENCOUNTER — Encounter: Payer: Medicare HMO | Admitting: Physical Therapy

## 2022-10-22 ENCOUNTER — Encounter: Payer: Self-pay | Admitting: Family Medicine

## 2022-10-22 VITALS — BP 120/84 | HR 68 | Ht 61.0 in | Wt 124.8 lb

## 2022-10-22 DIAGNOSIS — M5481 Occipital neuralgia: Secondary | ICD-10-CM

## 2022-10-22 NOTE — Patient Instructions (Addendum)
Thank you for coming in today.   I've referred you to Dr. Nelva Bush.  Let us know if you don't hear from them in one week.   Check back with me as needed

## 2022-10-24 ENCOUNTER — Encounter: Payer: Medicare HMO | Admitting: Physical Therapy

## 2022-10-28 ENCOUNTER — Other Ambulatory Visit (INDEPENDENT_AMBULATORY_CARE_PROVIDER_SITE_OTHER): Payer: Medicare HMO

## 2022-10-28 DIAGNOSIS — E782 Mixed hyperlipidemia: Secondary | ICD-10-CM | POA: Diagnosis not present

## 2022-10-28 DIAGNOSIS — R739 Hyperglycemia, unspecified: Secondary | ICD-10-CM

## 2022-10-28 DIAGNOSIS — E038 Other specified hypothyroidism: Secondary | ICD-10-CM

## 2022-10-28 DIAGNOSIS — M810 Age-related osteoporosis without current pathological fracture: Secondary | ICD-10-CM

## 2022-10-28 DIAGNOSIS — N1831 Chronic kidney disease, stage 3a: Secondary | ICD-10-CM | POA: Diagnosis not present

## 2022-10-28 LAB — COMPREHENSIVE METABOLIC PANEL
ALT: 16 U/L (ref 0–35)
AST: 19 U/L (ref 0–37)
Albumin: 4 g/dL (ref 3.5–5.2)
Alkaline Phosphatase: 71 U/L (ref 39–117)
BUN: 25 mg/dL — ABNORMAL HIGH (ref 6–23)
CO2: 29 mEq/L (ref 19–32)
Calcium: 10.5 mg/dL (ref 8.4–10.5)
Chloride: 103 mEq/L (ref 96–112)
Creatinine, Ser: 1.03 mg/dL (ref 0.40–1.20)
GFR: 51.4 mL/min — ABNORMAL LOW (ref >60.00)
Glucose, Bld: 100 mg/dL — ABNORMAL HIGH (ref 70–99)
Potassium: 3.8 mEq/L (ref 3.5–5.1)
Sodium: 139 mEq/L (ref 135–145)
Total Bilirubin: 0.3 mg/dL (ref 0.2–1.2)
Total Protein: 7 g/dL (ref 6.0–8.3)

## 2022-10-28 LAB — CBC WITH DIFFERENTIAL/PLATELET
Basophils Absolute: 0 10*3/uL (ref 0.0–0.1)
Basophils Relative: 0.5 % (ref 0.0–3.0)
Eosinophils Absolute: 0.2 10*3/uL (ref 0.0–0.7)
Eosinophils Relative: 1.7 % (ref 0.0–5.0)
HCT: 43.2 % (ref 36.0–46.0)
Hemoglobin: 14.6 g/dL (ref 12.0–15.0)
Lymphocytes Relative: 25.7 % (ref 12.0–46.0)
Lymphs Abs: 2.3 10*3/uL (ref 0.7–4.0)
MCHC: 33.8 g/dL (ref 30.0–36.0)
MCV: 90.6 fl (ref 78.0–100.0)
Monocytes Absolute: 0.9 10*3/uL (ref 0.1–1.0)
Monocytes Relative: 9.5 % (ref 3.0–12.0)
Neutro Abs: 5.7 10*3/uL (ref 1.4–7.7)
Neutrophils Relative %: 62.6 % (ref 43.0–77.0)
Platelets: 209 10*3/uL (ref 150.0–400.0)
RBC: 4.77 Mil/uL (ref 3.87–5.11)
RDW: 13.9 % (ref 11.5–15.5)
WBC: 9 10*3/uL (ref 4.0–10.5)

## 2022-10-28 LAB — TSH: TSH: 1.26 u[IU]/mL (ref 0.35–5.50)

## 2022-10-28 LAB — LIPID PANEL
Cholesterol: 226 mg/dL — ABNORMAL HIGH (ref 0–200)
HDL: 58.3 mg/dL (ref >39.00)
NonHDL: 168.01
Total CHOL/HDL Ratio: 4
Triglycerides: 248 mg/dL — ABNORMAL HIGH (ref 0.0–149.0)
VLDL: 49.6 mg/dL — ABNORMAL HIGH (ref 0.0–40.0)

## 2022-10-28 LAB — T4, FREE: Free T4: 0.91 ng/dL (ref 0.60–1.60)

## 2022-10-28 LAB — HEMOGLOBIN A1C: Hgb A1c MFr Bld: 6.2 % (ref 4.6–6.5)

## 2022-10-28 LAB — VITAMIN D 25 HYDROXY (VIT D DEFICIENCY, FRACTURES): VITD: 32.7 ng/mL (ref 30.00–100.00)

## 2022-10-29 ENCOUNTER — Encounter: Payer: Self-pay | Admitting: Internal Medicine

## 2022-10-29 ENCOUNTER — Encounter: Payer: Medicare HMO | Admitting: Physical Therapy

## 2022-10-29 LAB — LDL CHOLESTEROL, DIRECT: Direct LDL: 146 mg/dL

## 2022-10-29 NOTE — Progress Notes (Unsigned)
Subjective:    Patient ID: Emily [name], female    DOB: 09-14-1940, 82 y.o.   MRN: [id number]      HPI [place] is here for a Physical exam and her chronic medical problems.    Have labs done prior.   Medications and allergies reviewed with patient and updated if appropriate.  Current Outpatient Medications on File Prior to Visit  Medication Sig Dispense Refill   Acetaminophen (TYLENOL 8 HOUR ARTHRITIS PAIN PO) Take by mouth.     atenolol (TENORMIN) 25 MG tablet TAKE 1 TABLET BY MOUTH EVERY DAY 90 tablet 1   Calcium-Vitamin D-Vitamin K [id number] MG-UNT-MCG TABS Take 1 tablet by mouth 2 (two) times daily.     chlorpheniramine (CHLOR-TRIMETON) 4 MG tablet Take 4 mg by mouth 2 (two) times daily as needed for allergies.     cholecalciferol (VITAMIN D) 25 MCG (1000 UNIT) tablet Take 1,000 Units by mouth daily.     [employer] ALLERGY RELIEF D 5-120 MG tablet TAKE 1 TABLET BY MOUTH TWICE A DAY 48 tablet 14   EPINEPHrine 0.3 mg/0.3 mL IJ SOAJ injection Inject 0.3 mg into the muscle as needed for anaphylaxis. 1 each 0   gabapentin (NEURONTIN) 100 MG capsule Take 1 capsule (100 mg total) by mouth at bedtime. Take 100-'300mg'$  at bedtime 30 capsule 3   ipratropium (ATROVENT) 0.03 % nasal spray SPRAY 2 SPRAYS IN EACH NOSTRIL EVERY 12 HOURS 90 mL 0   levothyroxine (SYNTHROID) 75 MCG tablet Take 1 tablet daily 30 minutes before breakfast daily 6 days a week and 1/2 tablet one day a week 90 tablet 3   triamcinolone (KENALOG) 0.025 % ointment Apply 1 application topically 2 (two) times daily. Use as needed. 30 g 1   No current facility-administered medications on file prior to visit.    Review of Systems     Objective:  There were no vitals filed for this visit. There were no vitals filed for this visit. There is no height or weight on file to calculate BMI.  BP Readings from Last 3 Encounters:  10/22/22 120/84  10/08/22 128/82  08/27/22 (!) 142/84    Wt Readings from Last 3 Encounters:   10/22/22 124 lb 12.8 oz (56.6 kg)  10/08/22 124 lb (56.2 kg)  08/27/22 124 lb (56.2 kg)       Physical Exam Constitutional: She appears well-developed and well-nourished. No distress.  HENT:  Head: Normocephalic and atraumatic.  Right Ear: External ear normal. Normal ear canal and TM Left Ear: External ear normal.  Normal ear canal and TM Mouth/Throat: Oropharynx is clear and moist.  Eyes: Conjunctivae normal.  Neck: Neck supple. No tracheal deviation present. No thyromegaly present.  No carotid bruit  Cardiovascular: Normal rate, regular rhythm and normal heart sounds.   No murmur heard.  No edema. Pulmonary/Chest: Effort normal and breath sounds normal. No respiratory distress. She has no wheezes. She has no rales.  Breast: deferred   Abdominal: Soft. She exhibits no distension. There is no tenderness.  Lymphadenopathy: She has no cervical adenopathy.  Skin: Skin is warm and dry. She is not diaphoretic.  Psychiatric: She has a normal mood and affect. Her behavior is normal.     Lab Results  Component Value Date   WBC 9.0 10/28/2022   HGB 14.6 10/28/2022   HCT 43.2 10/28/2022   PLT 209.0 10/28/2022   GLUCOSE 100 (H) 10/28/2022   CHOL 226 (H) 10/28/2022   TRIG 248.0 (H) 10/28/2022  HDL 58.30 10/28/2022   LDLDIRECT 146.0 10/28/2022   LDLCALC 110 (H) 09/27/2021   ALT 16 10/28/2022   AST 19 10/28/2022   NA 139 10/28/2022   K 3.8 10/28/2022   CL 103 10/28/2022   CREATININE 1.03 10/28/2022   BUN 25 (H) 10/28/2022   CO2 29 10/28/2022   TSH 1.26 10/28/2022   HGBA1C 6.2 10/28/2022         Assessment & Plan:   Physical exam: Screening blood work  ordered Exercise   Weight   Substance abuse  none   Reviewed recommended immunizations.   Health Maintenance  Topic Date Due   COVID-19 Vaccine (5 - 2023-24 season) 04/19/2022   Medicare Annual Wellness (AWV)  01/23/2023   DEXA SCAN  03/15/2024   DTaP/Tdap/Td (3 - Td or Tdap) 07/07/2027   Pneumonia Vaccine  46+ Years old  Completed   INFLUENZA VACCINE  Completed   Zoster Vaccines- Shingrix  Completed   HPV VACCINES  Aged Out          See Problem List for Assessment and Plan of chronic medical problems.

## 2022-10-29 NOTE — Patient Instructions (Signed)
We will look into prolia for you   Medications changes include :   none     Return in about 1 year (around 10/30/2023) for Physical Exam.   Health Maintenance, Female Adopting a healthy lifestyle and getting preventive care are important in promoting health and wellness. Ask your health care provider about: The right schedule for you to have regular tests and exams. Things you can do on your own to prevent diseases and keep yourself healthy. What should I know about diet, weight, and exercise? Eat a healthy diet  Eat a diet that includes plenty of vegetables, fruits, low-fat dairy products, and lean protein. Do not eat a lot of foods that are high in solid fats, added sugars, or sodium. Maintain a healthy weight Body mass index (BMI) is used to identify weight problems. It estimates body fat based on height and weight. Your health care provider can help determine your BMI and help you achieve or maintain a healthy weight. Get regular exercise Get regular exercise. This is one of the most important things you can do for your health. Most adults should: Exercise for at least 150 minutes each week. The exercise should increase your heart rate and make you sweat (moderate-intensity exercise). Do strengthening exercises at least twice a week. This is in addition to the moderate-intensity exercise. Spend less time sitting. Even light physical activity can be beneficial. Watch cholesterol and blood lipids Have your blood tested for lipids and cholesterol at 81 years of age, then have this test every 5 years. Have your cholesterol levels checked more often if: Your lipid or cholesterol levels are high. You are older than 81 years of age. You are at high risk for heart disease. What should I know about cancer screening? Depending on your health history and family history, you may need to have cancer screening at various ages. This may include screening for: Breast cancer. Cervical  cancer. Colorectal cancer. Skin cancer. Lung cancer. What should I know about heart disease, diabetes, and high blood pressure? Blood pressure and heart disease High blood pressure causes heart disease and increases the risk of stroke. This is more likely to develop in people who have high blood pressure readings or are overweight. Have your blood pressure checked: Every 3-5 years if you are 72-40 years of age. Every year if you are 2 years old or older. Diabetes Have regular diabetes screenings. This checks your fasting blood sugar level. Have the screening done: Once every three years after age 55 if you are at a normal weight and have a low risk for diabetes. More often and at a younger age if you are overweight or have a high risk for diabetes. What should I know about preventing infection? Hepatitis B If you have a higher risk for hepatitis B, you should be screened for this virus. Talk with your health care provider to find out if you are at risk for hepatitis B infection. Hepatitis C Testing is recommended for: Everyone born from [birth year] through 1964. Anyone with known risk factors for hepatitis C. Sexually transmitted infections (STIs) Get screened for STIs, including gonorrhea and chlamydia, if: You are sexually active and are younger than 81 years of age. You are older than 81 years of age and your health care provider tells you that you are at risk for this type of infection. Your sexual activity has changed since you were last screened, and you are at increased risk for chlamydia or gonorrhea. Ask your health  care provider if you are at risk. Ask your health care provider about whether you are at high risk for HIV. Your health care provider may recommend a prescription medicine to help prevent HIV infection. If you choose to take medicine to prevent HIV, you should first get tested for HIV. You should then be tested every 3 months for as long as you are taking the  medicine. Pregnancy If you are about to stop having your period (premenopausal) and you may become pregnant, seek counseling before you get pregnant. Take 400 to 800 micrograms (mcg) of folic acid every day if you become pregnant. Ask for birth control (contraception) if you want to prevent pregnancy. Osteoporosis and menopause Osteoporosis is a disease in which the bones lose minerals and strength with aging. This can result in bone fractures. If you are 20 years old or older, or if you are at risk for osteoporosis and fractures, ask your health care provider if you should: Be screened for bone loss. Take a calcium or vitamin D supplement to lower your risk of fractures. Be given hormone replacement therapy (HRT) to treat symptoms of menopause. Follow these instructions at home: Alcohol use Do not drink alcohol if: Your health care provider tells you not to drink. You are pregnant, may be pregnant, or are planning to become pregnant. If you drink alcohol: Limit how much you have to: 0-1 drink a day. Know how much alcohol is in your drink. In the [place] one drink equals one 12 oz bottle of beer (355 mL), one 5 oz glass of wine (148 mL), or one 1 oz glass of hard liquor (44 mL). Lifestyle Do not use any products that contain nicotine or tobacco. These products include cigarettes, chewing tobacco, and vaping devices, such as e-cigarettes. If you need help quitting, ask your health care provider. Do not use street drugs. Do not share needles. Ask your health care provider for help if you need support or information about quitting drugs. General instructions Schedule regular health, dental, and eye exams. Stay current with your vaccines. Tell your health care provider if: You often feel depressed. You have ever been abused or do not feel safe at home. Summary Adopting a healthy lifestyle and getting preventive care are important in promoting health and wellness. Follow your health care  provider's instructions about healthy diet, exercising, and getting tested or screened for diseases. Follow your health care provider's instructions on monitoring your cholesterol and blood pressure. This information is not intended to replace advice given to you by your health care provider. Make sure you discuss any questions you have with your health care provider. Document Revised: 12/25/2020 Document Reviewed: 12/25/2020 Elsevier Patient Education  2023 [employer].

## 2022-10-30 ENCOUNTER — Ambulatory Visit (INDEPENDENT_AMBULATORY_CARE_PROVIDER_SITE_OTHER): Payer: Medicare HMO | Admitting: Internal Medicine

## 2022-10-30 VITALS — BP 122/70 | HR 70 | Temp 98.1°F | Ht 61.0 in | Wt 125.8 lb

## 2022-10-30 DIAGNOSIS — N3941 Urge incontinence: Secondary | ICD-10-CM | POA: Diagnosis not present

## 2022-10-30 DIAGNOSIS — R002 Palpitations: Secondary | ICD-10-CM

## 2022-10-30 DIAGNOSIS — R7303 Prediabetes: Secondary | ICD-10-CM | POA: Diagnosis not present

## 2022-10-30 DIAGNOSIS — M810 Age-related osteoporosis without current pathological fracture: Secondary | ICD-10-CM | POA: Diagnosis not present

## 2022-10-30 DIAGNOSIS — I700 Atherosclerosis of aorta: Secondary | ICD-10-CM

## 2022-10-30 DIAGNOSIS — E038 Other specified hypothyroidism: Secondary | ICD-10-CM

## 2022-10-30 DIAGNOSIS — Z0000 Encounter for general adult medical examination without abnormal findings: Secondary | ICD-10-CM

## 2022-10-30 DIAGNOSIS — E782 Mixed hyperlipidemia: Secondary | ICD-10-CM | POA: Diagnosis not present

## 2022-10-30 DIAGNOSIS — R4789 Other speech disturbances: Secondary | ICD-10-CM

## 2022-10-30 DIAGNOSIS — N1831 Chronic kidney disease, stage 3a: Secondary | ICD-10-CM

## 2022-10-30 DIAGNOSIS — R931 Abnormal findings on diagnostic imaging of heart and coronary circulation: Secondary | ICD-10-CM | POA: Insufficient documentation

## 2022-10-30 NOTE — Assessment & Plan Note (Signed)
Chronic Lab Results  Component Value Date   HGBA1C 6.2 10/28/2022   Sugars in prediabetic range Encouraged regular activity/exercise Low sugar/carbohydrate diet

## 2022-10-30 NOTE — Assessment & Plan Note (Signed)
New Overall mild She typically wears a pad which helps Denies stress incontinence Encouraged frequent bathroom trips to avoid a full bladder Avoid too much caffeine Discussed treatment options including medication and possible side effects, pelvic physical therapy, referral to urogynecologist

## 2022-10-30 NOTE — Assessment & Plan Note (Signed)
Chronic Euthyroid TSH in normal range Continue current dose of levothyroxine-75 mcg daily 6 days a week, 37.5 mcg daily 1 day a week

## 2022-10-30 NOTE — Assessment & Plan Note (Signed)
Chronic °Controlled, Stable °Continue atenolol 25 mg daily °

## 2022-10-30 NOTE — Assessment & Plan Note (Signed)
Chronic Mild, stable Avoid NSAIDs Maintain good water intake

## 2022-10-30 NOTE — Assessment & Plan Note (Signed)
New States some word finding difficulty-often forgets the words she is trying to say or what she was thinking Most of this comes back to her at some point She knows some of this is normal, but it does concern her I do think the symptoms she is expressing are normal-monitor for now Discussed that if she is concerned we can evaluate further with neuropsychological testing

## 2022-10-30 NOTE — Assessment & Plan Note (Signed)
Chronic Regular exercise and healthy diet encouraged Check lipid panel  Continue lifestyle control given low coronary calcium score

## 2022-10-30 NOTE — Assessment & Plan Note (Signed)
Chronic Mild Coronary artery calcium score is on the low side Healthy diet, regular exercise encouraged

## 2022-10-30 NOTE — Assessment & Plan Note (Signed)
Chronic She is interested in starting medication Reviewed Fosamax, Reclast and Prolia She is interested in Prolia Will look into coverage by [employer] Will start if covered Stressed regular exercise, calcium and vitamin D

## 2022-10-31 ENCOUNTER — Ambulatory Visit (INDEPENDENT_AMBULATORY_CARE_PROVIDER_SITE_OTHER): Payer: Medicare HMO | Admitting: Family Medicine

## 2022-10-31 ENCOUNTER — Encounter: Payer: Self-pay | Admitting: Family Medicine

## 2022-10-31 ENCOUNTER — Other Ambulatory Visit: Payer: Self-pay

## 2022-10-31 VITALS — BP 138/84 | HR 81 | Ht 61.0 in | Wt 126.8 lb

## 2022-10-31 DIAGNOSIS — M7062 Trochanteric bursitis, left hip: Secondary | ICD-10-CM | POA: Diagnosis not present

## 2022-10-31 DIAGNOSIS — M25562 Pain in left knee: Secondary | ICD-10-CM | POA: Diagnosis not present

## 2022-10-31 DIAGNOSIS — G8929 Other chronic pain: Secondary | ICD-10-CM

## 2022-10-31 NOTE — Patient Instructions (Addendum)
Thank you for coming in today.   You received an injection today. Seek immediate medical attention if the joint becomes red, extremely painful, or is oozing fluid.   I've referred you to Physical Therapy.  Let us know if you don't hear from them in one week.   Check back as needed

## 2022-10-31 NOTE — Progress Notes (Signed)
Shirlyn Goltz, PhD, LAT, ATC acting as a scribe for Lynne Leader, MD.  Emily [name] is a 82 y.o. female who presents to [place] at [facility] today for reoccurring left knee pain.  Patient was last seen by Dr. Georgina Snell on 10/22/2022 for occipital neuralgia.  Today, patient reports left knee pain x 1 day. Sx started after bracing to prevent tripping  Patient locates pain to lateral aspect of the knee. Has to lead with left leg when ascending stairs. Sx are similar to sx prior to starting PT.   L knee swelling: no Mechanical symptoms: no Radiates: having some pain in the left hip Aggravates: WB, ambulation Treatments tried:ice, heat, Voltaren  Pertinent review of systems: no fever or chills  Relevant historical information: hypothyroid    Exam:  BP 138/84   Pulse 81   Ht 5\' 1"  (1.549 m)   Wt 126 lb 12.8 oz (57.5 kg)   SpO2 98%   BMI 23.96 kg/m  General: Well Developed, well nourished, and in no acute distress.   MSK: Left knee: Normal-appearing Normal motion with crepitation Tender palpation medial joint line Stable ligamentous exam. Intact strength. Positive McMurray's test.  Left lateral hip normal appearing Tender palpation greater trochanter.  Normal motion. Hip abduction strength is reduced.  Lab and Radiology Results  X-ray images left knee from [facility] dated August 19, 2021 provided by patient on a CD personally and independently interpreted Mild DJD no acute fractures  Procedure: Real-time Ultrasound Guided Injection of left knee joint superior lateral patellar space Device: Philips Affiniti 50G Images permanently stored and available for review in PACS Verbal informed consent obtained.  Discussed risks and benefits of procedure. Warned about infection, bleeding, hyperglycemia damage to structures among others. Patient expresses understanding and agreement Time-out conducted.   Noted no overlying erythema, induration, or  other signs of local infection.   Skin prepped in a sterile fashion.   Local anesthesia: Topical Ethyl chloride.   With sterile technique and under real time ultrasound guidance:  40mg  triamcinolone and 2 mL Marcaine injected into knee joint. Fluid seen entering the knee joint.   Completed without difficulty   Pain immediately resolved suggesting accurate placement of the medication.   Advised to call if fevers/chills, erythema, induration, drainage, or persistent bleeding.   Images permanently stored and available for review in the ultrasound unit.  Impression: Technically successful ultrasound guided injection.      Assessment and Plan: 82 y.o. female with Left knee pain thought to be exacerbation of DJD.  Plan for steroid injection and physical therapy trial.  Additionally she has lateral hip pain thought to be exacerbation of hip bursitis.  Again physical therapy.     PDMP not reviewed this encounter. Orders Placed This Encounter  Procedures   [place] LIMITED JOINT SPACE STRUCTURES LOW LEFT(NO LINKED CHARGES)    Order Specific Question:   Reason for Exam (SYMPTOM  OR DIAGNOSIS REQUIRED)    Answer:   left knee pain    Order Specific Question:   Preferred imaging location?    Answer:   [facility]   Ambulatory referral to Physical Therapy    Referral Priority:   Routine    Referral Type:   Physical Medicine    Referral Reason:   Specialty Services Required    Requested Specialty:   Physical Therapy    Number of Visits Requested:   1   No orders of the defined types were placed in  this encounter.    Discussed warning signs or symptoms. Please see discharge instructions. Patient expresses understanding.   The above documentation has been reviewed and is accurate and complete Lynne Leader, M.D.

## 2022-11-01 ENCOUNTER — Telehealth: Payer: Self-pay | Admitting: Family Medicine

## 2022-11-01 NOTE — Telephone Encounter (Signed)
Patient was here to see Dr Georgina Snell and had an injection in her knee yesterday. She said that she is still having a lot of pain and wanted to know if she should increase her gabapentin or what would be best?  Please advise.

## 2022-11-01 NOTE — Telephone Encounter (Signed)
Per visit note 10/31/22:  Assessment and Plan: 82 y.o. female with Left knee pain thought to be exacerbation of DJD.  Plan for steroid injection and physical therapy trial.   Additionally she has lateral hip pain thought to be exacerbation of hip bursitis.  Again physical therapy.   Forwarding to Dr. Georgina Snell to advise.

## 2022-11-05 ENCOUNTER — Encounter: Payer: Medicare HMO | Admitting: Physical Therapy

## 2022-11-05 ENCOUNTER — Telehealth: Payer: Self-pay | Admitting: Internal Medicine

## 2022-11-05 NOTE — Telephone Encounter (Signed)
Notified pt will send msg to [place] team concerning prolia. This will be her first time receiving.Will need coverage review.../l,mb

## 2022-11-05 NOTE — Telephone Encounter (Signed)
Patient called to check on the status of her being able to get a prolia shot. She would like a call back at [phone].

## 2022-11-05 NOTE — Telephone Encounter (Signed)
Called and spoke with patient. She tried increasing the Gabapentin but it caused head pain so she d/c. She has appointment scheduled with PT on 11/11/22. She will resume Gabapentin 100 mg at bedtime if needed. She will call the office if she has any other questions or concerns.

## 2022-11-05 NOTE — Telephone Encounter (Signed)
Okay to try increasing gabapentin.  Ultimately I think physical therapy ischemia to better.

## 2022-11-07 ENCOUNTER — Encounter: Payer: Medicare HMO | Admitting: Internal Medicine

## 2022-11-07 DIAGNOSIS — L820 Inflamed seborrheic keratosis: Secondary | ICD-10-CM | POA: Diagnosis not present

## 2022-11-11 ENCOUNTER — Ambulatory Visit: Payer: Medicare HMO | Admitting: Physical Therapy

## 2022-11-11 DIAGNOSIS — M25552 Pain in left hip: Secondary | ICD-10-CM

## 2022-11-11 DIAGNOSIS — M5459 Other low back pain: Secondary | ICD-10-CM

## 2022-11-11 DIAGNOSIS — M25562 Pain in left knee: Secondary | ICD-10-CM

## 2022-11-11 DIAGNOSIS — M79605 Pain in left leg: Secondary | ICD-10-CM | POA: Diagnosis not present

## 2022-11-11 NOTE — Therapy (Signed)
OUTPATIENT PHYSICAL THERAPY THORACOLUMBAR EVALUATION   Patient Name: Emily [name] MRN: [id number] DOB:1940-09-20, 82 y.o., female Today's Date: 11/11/22  END OF SESSION:    Past Medical History:  Diagnosis Date   Actinic keratosis    Allergic rhinitis    BACK PAIN    CARCINOMA, BREAST, 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   L s/p lumpectomy, chemo and XRT   Cervicalgia    Cholelithiasis    DIVERTICULOSIS, COLON    ENDOMETRIOSIS    GERD (gastroesophageal reflux disease)    Heart palpitations    Hypertension    HYPOTHYROIDISM    Left wrist fracture 11/06/2016   10/2016 - fell of high stool   Osteoporosis    Personal history of chemotherapy    Personal history of radiation therapy    Pleural plaque without asbestos    CT chest 10/2010: R>L lobes, upper> lower   Squamous cell carcinoma    History of BC   Past Surgical History:  Procedure Laterality Date   BREAST LUMPECTOMY  2001   left   CATARACT EXTRACTION Bilateral 09/2014   both eyes   CHOLECYSTECTOMY  2010   ENDOMETRIAL ABLATION     EXCISION / BIOPSY BREAST / NIPPLE / DUCT Left 2001   Patient Active Problem List   Diagnosis Date Noted   Agatston coronary artery calcium score 22.4  (12/2021) 10/30/2022   Urge incontinence of urine 10/30/2022   Word finding difficulty 10/30/2022   Chronic pain of left knee 08/21/2022   Lumbar radiculopathy 03/25/2022   Cough 12/27/2021   Sleep difficulties 09/27/2021   Cervicogenic headache 04/10/2021   Elevated blood pressure reading 04/10/2021   Skin abnormality 02/27/2021   Aortic atherosclerosis ([place]) 09/22/2020   Urinary urgency 03/23/2020   CKD (chronic kidney disease) stage 3, GFR 30-59 ml/min ([place]) 03/22/2020   Prediabetes 03/22/2020   Change in stool 11/17/2019   Right-sided chest wall pain 11/17/2019   Urinary retention 09/23/2019   SOB (shortness of breath) 01/15/2019   Upper airway cough syndrome 01/15/2019   Belching 12/14/2018   Reactive airway disease  12/05/2018   Tear of left rotator cuff 10/05/2018   Pre-syncope 05/19/2018   DDD (degenerative disc disease), cervical 01/15/2018   Neuralgia 09/12/2017   Presbycusis of both ears 12/11/2016   Memory difficulties 11/06/2016   IBS (irritable bowel syndrome) 04/16/2016   Hyperlipidemia 09/10/2015   Osteoporosis 09/08/2015   Palpitations    Pleural plaque without asbestos 12/03/2010   Chronic neck pain 12/18/2009   DIVERTICULOSIS, COLON 03/02/2009   Lumbago 03/11/2008   Actinic keratosis 01/23/2008   CARCINOMA, BREAST, ESTROGEN RECEPTOR NEGATIVE 10/12/2007   Hypothyroidism 06/18/2007   Allergic rhinitis 06/11/2007    PCP: Billey Gosling  REFERRING PROVIDER: Lynne Leader   REFERRING DIAG:   Rationale for Evaluation and Treatment: Rehabilitation  THERAPY DIAG:  No diagnosis found.  ONSET DATE:   SUBJECTIVE:  SUBJECTIVE STATEMENT:  11/11/2022  Eval:  L leg pain: almost fell.. States pain in L hip down leg.  Had shot in L knee. Saturday was very bad , had to use cane.  Sunday better, better today,  Feels pain starts in back.  Has appt with [facility] (previous epidurals) April 1. For neck and for back.  Has also had cortisone shot in January for hip.    Eval:  Pt states pain in L leg since August.  Had epidural in spine L5, no help. In September. Also had nerve block. Helped back pain, but still has leg pain.  Also has seen Ortho, now with IT band pain.  Now seeing sports met. She also had coristone shot in hip 1/9.  Having to use cane at times for pain, was using walker when it was really bad.  Can't put pressure on leg. Feels better at night, sleeping through night. Feels back to baseline today after hip injection.  Did not have much PT for this yet.  Most pain with standing/walking and with initial  standing.   PERTINENT HISTORY:   PAIN:  Are you having pain? Yes: NPRS scale: up to 7/10 Pain location: L Leg Pain description: sore,  painful Aggravating factors: increased activity, standing, walking , turning to L.  Relieving factors: sitting, rest  PRECAUTIONS: None  WEIGHT BEARING RESTRICTIONS: No  FALLS:  Has patient fallen in last 6 months? No   OCCUPATION:   PLOF: Independent  PATIENT GOALS: decreased pain in leg.    OBJECTIVE:   DIAGNOSTIC FINDINGS:   PATIENT SURVEYS:   COGNITION: Overall cognitive status: Within functional limits for tasks assessed     SENSATION: WFL  POSTURE:    PALPATION: Tenderness in distal ITB, gr troch, into glute.    LUMBAR ROM:   AROM eval  Flexion wfl  Extension Mod limitation  Right lateral flexion Mild limitation  Left lateral flexion Mild limitation  Right rotation   Left rotation    (Blank rows = not tested)  LOWER EXTREMITY MMT   MMT Right eval Left eval  Hip flexion 4 4-  Hip extension    Hip abduction 4 4-  Hip adduction    Hip internal rotation    Hip external rotation    Knee flexion  4+  Knee extension  4+  Ankle dorsiflexion    Ankle plantarflexion    Ankle inversion    Ankle eversion     (Blank rows = not tested)   LUMBAR SPECIAL TESTS:    TODAY'S TREATMENT:                                                                                                                              DATE:   09/03/22:  Ther ex: see below for HEP,  Modalities: ionto with dexamethasone, 4 hr patch to L distal ITB  PATIENT EDUCATION:  Education details: PT POC, Exam findings, HEP Person educated: Patient Education method:  Explanation, Demonstration, Tactile cues, Verbal cues, and Handouts Education comprehension: verbalized understanding, returned demonstration, verbal cues required, tactile cues required, and needs further education  HOME EXERCISE PROGRAM:  Access Code:  [id number]     ASSESSMENT:  CLINICAL IMPRESSION: Patient presents with primary complaint of increased pain in L leg and hip. She has had ongoing deficits in back as well. She has soreness in gr trochanter, glute, and at distal ITB. She has weakness in hip and has had ongoing pain since August. She has decreased ability for sustaining standing activity, walking, and functional activity. Pt to benefit from skilled PT to improve deficits and pain.   OBJECTIVE IMPAIRMENTS: Abnormal gait, decreased activity tolerance, decreased knowledge of use of DME, decreased mobility, difficulty walking, decreased ROM, decreased strength, increased muscle spasms, improper body mechanics, and pain.   ACTIVITY LIMITATIONS: carrying, lifting, bending, standing, squatting, stairs, transfers, and locomotion level  PARTICIPATION LIMITATIONS: cleaning, laundry, shopping, and community activity  PERSONAL FACTORS: Time since onset of injury/illness/exacerbation are also affecting patient's functional outcome.   REHAB POTENTIAL: Good  CLINICAL DECISION MAKING: Stable/uncomplicated  EVALUATION COMPLEXITY: Low   GOALS: Goals reviewed with patient? Yes  SHORT TERM GOALS: Target date: 09/17/2022    Pt to be independent with initial HEP  Goal status: INITIAL  2.  Pt to report decreased pain in ITB to 0-3/10  Goal status: INITIAL   LONG TERM GOALS: Target date: 10/29/2022  Pt to be independent with final HEP   Goal status: INITIAL  2.  Pt to report decreased pain in hip and LE to 0-3/10 with standing activity.   Goal status: INITIAL  3.  Pt to demo increased strength of L hip and LE to at least 4+/5 to improve ability for stairs and gait.   Goal status: INITIAL  4.  Pt to report ability for standing activity for at least 30 min with pain 0-3/10.    Goal status: INITIAL    PLAN:  PT FREQUENCY: 1-2x/week  PT DURATION: 8 weeks  PLANNED INTERVENTIONS: Therapeutic exercises, Therapeutic  activity, Neuromuscular re-education, Balance training, Gait training, Patient/Family education, Self Care, Joint mobilization, Joint manipulation, Stair training, DME instructions, Aquatic Therapy, Dry Needling, Electrical stimulation, Spinal manipulation, Spinal mobilization, Cryotherapy, Moist heat, Taping, Traction, Ultrasound, Ionotophoresis 4mg /ml Dexamethasone, and Manual therapy.  PLAN FOR NEXT SESSION:    Lyndee Hensen, PT, DPT 8:47 AM  11/11/22

## 2022-11-13 ENCOUNTER — Encounter: Payer: Self-pay | Admitting: Physical Therapy

## 2022-11-15 ENCOUNTER — Other Ambulatory Visit: Payer: Self-pay | Admitting: Internal Medicine

## 2022-11-18 DIAGNOSIS — R519 Headache, unspecified: Secondary | ICD-10-CM | POA: Diagnosis not present

## 2022-11-18 DIAGNOSIS — M5416 Radiculopathy, lumbar region: Secondary | ICD-10-CM | POA: Diagnosis not present

## 2022-11-19 ENCOUNTER — Encounter: Payer: Self-pay | Admitting: Physical Therapy

## 2022-11-19 ENCOUNTER — Ambulatory Visit: Payer: Medicare HMO | Admitting: Physical Therapy

## 2022-11-19 DIAGNOSIS — M5459 Other low back pain: Secondary | ICD-10-CM | POA: Diagnosis not present

## 2022-11-19 DIAGNOSIS — M25562 Pain in left knee: Secondary | ICD-10-CM | POA: Diagnosis not present

## 2022-11-19 DIAGNOSIS — M79605 Pain in left leg: Secondary | ICD-10-CM | POA: Diagnosis not present

## 2022-11-19 DIAGNOSIS — M25552 Pain in left hip: Secondary | ICD-10-CM

## 2022-11-19 NOTE — Telephone Encounter (Signed)
Patient called back checking on this.

## 2022-11-19 NOTE — Therapy (Unsigned)
OUTPATIENT PHYSICAL THERAPY THORACOLUMBAR TREATMENT   Patient Name: Lailana Zumbrun MRN: [id number] DOB:[birth year], 82 y.o., female Today's Date: 11/19/22  END OF SESSION:  PT End of Session - 11/19/22 1439     Visit Number 2    Number of Visits 16    Date for PT Re-Evaluation 01/06/23    Authorization Type [employer]    PT Start Time 1435    PT Stop Time [id number]    PT Time Calculation (min) 40 min    Activity Tolerance Patient tolerated treatment well    Behavior During Therapy WFL for tasks assessed/performed               Past Medical History:  Diagnosis Date   Actinic keratosis    Allergic rhinitis    BACK PAIN    CARCINOMA, BREAST, 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   L s/p lumpectomy, chemo and XRT   Cervicalgia    Cholelithiasis    DIVERTICULOSIS, COLON    ENDOMETRIOSIS    GERD (gastroesophageal reflux disease)    Heart palpitations    Hypertension    HYPOTHYROIDISM    Left wrist fracture 11/06/2016   10/2016 - fell of high stool   Osteoporosis    Personal history of chemotherapy    Personal history of radiation therapy    Pleural plaque without asbestos    CT chest 10/2010: R>L lobes, upper> lower   Squamous cell carcinoma    History of BC   Past Surgical History:  Procedure Laterality Date   BREAST LUMPECTOMY  2001   left   CATARACT EXTRACTION Bilateral 09/2014   both eyes   CHOLECYSTECTOMY  2010   ENDOMETRIAL ABLATION     EXCISION / BIOPSY BREAST / NIPPLE / DUCT Left 2001   Patient Active Problem List   Diagnosis Date Noted   Agatston coronary artery calcium score 22.4  (12/2021) 10/30/2022   Urge incontinence of urine 10/30/2022   Word finding difficulty 10/30/2022   Chronic pain of left knee 08/21/2022   Lumbar radiculopathy 03/25/2022   Cough 12/27/2021   Sleep difficulties 09/27/2021   Cervicogenic headache 04/10/2021   Elevated blood pressure reading 04/10/2021   Skin abnormality 02/27/2021   Aortic atherosclerosis 09/22/2020    Urinary urgency 03/23/2020   CKD (chronic kidney disease) stage 3, GFR 30-59 ml/min 03/22/2020   Prediabetes 03/22/2020   Change in stool 11/17/2019   Right-sided chest wall pain 11/17/2019   Urinary retention 09/23/2019   SOB (shortness of breath) 01/15/2019   Upper airway cough syndrome 01/15/2019   Belching 12/14/2018   Reactive airway disease 12/05/2018   Tear of left rotator cuff 10/05/2018   Pre-syncope 05/19/2018   DDD (degenerative disc disease), cervical 01/15/2018   Neuralgia 09/12/2017   Presbycusis of both ears 12/11/2016   Memory difficulties 11/06/2016   IBS (irritable bowel syndrome) 04/16/2016   Hyperlipidemia 09/10/2015   Osteoporosis 09/08/2015   Palpitations    Pleural plaque without asbestos 12/03/2010   Chronic neck pain 12/18/2009   DIVERTICULOSIS, COLON 03/02/2009   Lumbago 03/11/2008   Actinic keratosis 01/23/2008   CARCINOMA, BREAST, ESTROGEN RECEPTOR NEGATIVE 10/12/2007   Hypothyroidism 06/18/2007   Allergic rhinitis 06/11/2007    PCP: Billey Gosling  REFERRING PROVIDER: Lynne Leader   REFERRING DIAG: L knee pain   Rationale for Evaluation and Treatment: Rehabilitation  THERAPY DIAG:  Other low back pain  Pain in left hip  Pain in left leg  Acute pain of left knee  ONSET DATE:  SUBJECTIVE:                                                                                                                                                                                           SUBJECTIVE STATEMENT: Pt states pain 1-2/10 in hip, leg, and states weakness feeling in leg, feels like she can't put full pressure on it when walking.   Eval: 11/11/2022  Pt States pain in L hip down leg.  She almost fell a couple weeks ago, and had increased pain in knee at the time. She Had shot in L knee, feels it helped some. Saturday was very bad , had to use cane to walk.  Pain now feels like it did previously when she was seen in PT in Jan/Feb.  Has appt with  [facility] (previous epidurals) April 1. For neck and for back.    Eval: 09/03/22 Pt states pain in L leg since August.  Had epidural in spine L5, no help. In September. Also had nerve block. Helped back pain, but still has leg pain.  Also has seen Ortho, now with IT band pain.  Now seeing sports met. She also had coristone shot in hip 1/9.  Having to use cane at times for pain, was using walker when it was really bad.  Can't put pressure on leg. Feels better at night, sleeping through night. Feels back to baseline today after hip injection.  Did not have much PT for this yet.  Most pain with standing/walking and with initial standing.   PERTINENT HISTORY:   PAIN:  Are you having pain? Yes: NPRS scale: up to 7/10 Pain location: L Leg Pain description: sore,  painful Aggravating factors: increased activity, standing, walking , turning to L.  Relieving factors: sitting, rest  PRECAUTIONS: None  WEIGHT BEARING RESTRICTIONS: No  FALLS:  Has patient fallen in last 6 months? No  OCCUPATION:   PLOF: Independent  PATIENT GOALS: decreased pain in leg.    OBJECTIVE:   DIAGNOSTIC FINDINGS:   PATIENT SURVEYS:   COGNITION: Overall cognitive status: Within functional limits for tasks assessed     SENSATION: WFL  POSTURE:    PALPATION: Tenderness in L Low lumbar, SI, glute, into L gr troch. Minimal pain in knee.    LUMBAR ROM:   AROM eval  Flexion wfl  Extension Mod limitation  Right lateral flexion Mild limitation  Left lateral flexion Mild limitation  Right rotation   Left rotation    (Blank rows = not tested)   Hip ROM: WFL  LOWER EXTREMITY MMT   MMT Right eval Left eval  Hip flexion 4 4-  Hip  extension    Hip abduction 4 4-  Hip adduction    Hip internal rotation    Hip external rotation    Knee flexion  4+  Knee extension  4+  Ankle dorsiflexion    Ankle plantarflexion    Ankle inversion    Ankle eversion     (Blank rows = not tested)   LUMBAR  SPECIAL TESTS:    TODAY'S TREATMENT:                                                                                                                              DATE:   11/19/22: Therapeutic Exercise: Aerobic:  Level 1 x 7 min;  Supine: Seated:   LAQ 4 lb 2 x 10 bil;  Standing:  marching x 20;   staggered stance weight shifts x 10 bil; Step throughs x 15 bil; Step ups 4 in , 1 UE support x 15 bil;  Stretches:  Seated HSS 30 sec x 3 on L; seated fwd flexion x 3;  Seated piriformis 30 sec x 2 bil;  Neuromuscular Re-education: Manual Therapy: Therapeutic Activity: Self Care:   PATIENT EDUCATION:  Education details: Updated and reviewed HEP Person educated: Patient Education method: Explanation, Demonstration, Tactile cues, Verbal cues, and Handouts Education comprehension: verbalized understanding, returned demonstration, verbal cues required, tactile cues required, and needs further education  HOME EXERCISE PROGRAM: Access Code: [id number]  Reviewed from previous    ASSESSMENT:  CLINICAL IMPRESSION:   Eval: Patient presents with primary complaint of increased pain in L leg . She has pain in back, hip and into LE that has been ongoing .She has weakness in hip and LE, and  has decreased ability for sustaining standing activity, walking, and functional activity due to pain . Pt to benefit from skilled PT to improve deficits and pain.   OBJECTIVE IMPAIRMENTS: Abnormal gait, decreased activity tolerance, decreased knowledge of use of DME, decreased mobility, difficulty walking, decreased ROM, decreased strength, increased muscle spasms, improper body mechanics, and pain.   ACTIVITY LIMITATIONS: carrying, lifting, bending, standing, squatting, stairs, transfers, and locomotion level  PARTICIPATION LIMITATIONS: cleaning, laundry, shopping, and community activity  PERSONAL FACTORS: Time since onset of injury/illness/exacerbation are also affecting patient's functional outcome.   REHAB  POTENTIAL: Good  CLINICAL DECISION MAKING: Stable/uncomplicated  EVALUATION COMPLEXITY: Low   GOALS: Goals reviewed with patient? Yes  SHORT TERM GOALS: Target date: 11/25/2022   Pt to be independent with initial HEP  Goal status: INITIAL   LONG TERM GOALS: Target date: 01/06/2023   Pt to be independent with final HEP   Goal status: INITIAL  2.  Pt to report decreased pain in back,  hip and LE to 0-2/10 with standing activity.   Goal status: INITIAL  3.  Pt to demo increased strength of L hip and LE to at least 4+/5 to improve ability for stairs and gait.   Goal status: INITIAL  4.  Pt to report  ability for standing activity for at least 30 min with pain 0-3/10.    Goal status: INITIAL    PLAN:  PT FREQUENCY: 1-2x/week  PT DURATION: 8 weeks  PLANNED INTERVENTIONS: Therapeutic exercises, Therapeutic activity, Neuromuscular re-education, Balance training, Gait training, Patient/Family education, Self Care, Joint mobilization, Joint manipulation, Stair training, DME instructions, Aquatic Therapy, Dry Needling, Electrical stimulation, Spinal manipulation, Spinal mobilization, Cryotherapy, Moist heat, Taping, Traction, Ultrasound, Ionotophoresis 4mg /ml Dexamethasone, and Manual therapy.  PLAN FOR NEXT SESSION:    Lyndee Hensen, PT, DPT 2:40 PM  11/19/22

## 2022-11-22 ENCOUNTER — Ambulatory Visit: Payer: Medicare HMO | Admitting: Physical Therapy

## 2022-11-22 ENCOUNTER — Encounter: Payer: Self-pay | Admitting: Physical Therapy

## 2022-11-22 DIAGNOSIS — M79605 Pain in left leg: Secondary | ICD-10-CM | POA: Diagnosis not present

## 2022-11-22 DIAGNOSIS — M5459 Other low back pain: Secondary | ICD-10-CM | POA: Diagnosis not present

## 2022-11-22 DIAGNOSIS — M25552 Pain in left hip: Secondary | ICD-10-CM

## 2022-11-22 NOTE — Therapy (Addendum)
OUTPATIENT PHYSICAL THERAPY THORACOLUMBAR TREATMENT   Patient Name: Emily [name] MRN: [id number] DOB:March 18, 1941, 82 y.o., female Today's Date: 11/22/22  END OF SESSION:  PT End of Session - 11/22/22 0856     Visit Number 3    Number of Visits 16    Date for PT Re-Evaluation 01/06/23    Authorization Type [employer]    PT Start Time [phone]    PT Stop Time 0930    PT Time Calculation (min) 41 min    Activity Tolerance Patient tolerated treatment well    Behavior During Therapy WFL for tasks assessed/performed                Past Medical History:  Diagnosis Date   Actinic keratosis    Allergic rhinitis    BACK PAIN    CARCINOMA, BREAST, 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   L s/p lumpectomy, chemo and XRT   Cervicalgia    Cholelithiasis    DIVERTICULOSIS, COLON    ENDOMETRIOSIS    GERD (gastroesophageal reflux disease)    Heart palpitations    Hypertension    HYPOTHYROIDISM    Left wrist fracture 11/06/2016   10/2016 - fell of high stool   Osteoporosis    Personal history of chemotherapy    Personal history of radiation therapy    Pleural plaque without asbestos    CT chest 10/2010: R>L lobes, upper> lower   Squamous cell carcinoma    History of BC   Past Surgical History:  Procedure Laterality Date   BREAST LUMPECTOMY  2001   left   CATARACT EXTRACTION Bilateral 09/2014   both eyes   CHOLECYSTECTOMY  2010   ENDOMETRIAL ABLATION     EXCISION / BIOPSY BREAST / NIPPLE / DUCT Left 2001   Patient Active Problem List   Diagnosis Date Noted   Agatston coronary artery calcium score 22.4  (12/2021) 10/30/2022   Urge incontinence of urine 10/30/2022   Word finding difficulty 10/30/2022   Chronic pain of left knee 08/21/2022   Lumbar radiculopathy 03/25/2022   Cough 12/27/2021   Sleep difficulties 09/27/2021   Cervicogenic headache 04/10/2021   Elevated blood pressure reading 04/10/2021   Skin abnormality 02/27/2021   Aortic atherosclerosis 09/22/2020    Urinary urgency 03/23/2020   CKD (chronic kidney disease) stage 3, GFR 30-59 ml/min 03/22/2020   Prediabetes 03/22/2020   Change in stool 11/17/2019   Right-sided chest wall pain 11/17/2019   Urinary retention 09/23/2019   SOB (shortness of breath) 01/15/2019   Upper airway cough syndrome 01/15/2019   Belching 12/14/2018   Reactive airway disease 12/05/2018   Tear of left rotator cuff 10/05/2018   Pre-syncope 05/19/2018   DDD (degenerative disc disease), cervical 01/15/2018   Neuralgia 09/12/2017   Presbycusis of both ears 12/11/2016   Memory difficulties 11/06/2016   IBS (irritable bowel syndrome) 04/16/2016   Hyperlipidemia 09/10/2015   Osteoporosis 09/08/2015   Palpitations    Pleural plaque without asbestos 12/03/2010   Chronic neck pain 12/18/2009   DIVERTICULOSIS, COLON 03/02/2009   Lumbago 03/11/2008   Actinic keratosis 01/23/2008   CARCINOMA, BREAST, ESTROGEN RECEPTOR NEGATIVE 10/12/2007   Hypothyroidism 06/18/2007   Allergic rhinitis 06/11/2007    PCP: Cheryll Cockayne  REFERRING PROVIDER: [name]   REFERRING DIAG: L knee pain   Rationale for Evaluation and Treatment: Rehabilitation  THERAPY DIAG:  Other low back pain  Pain in left hip  Pain in left leg  ONSET DATE:   SUBJECTIVE:  SUBJECTIVE STATEMENT: Pt states minimal pain today. States more pain when she is walking a lot.   Eval: 11/11/2022  Pt States pain in L hip down leg.  She almost fell a couple weeks ago, and had increased pain in knee at the time. She Had shot in L knee, feels it helped some. Saturday was very bad , had to use cane to walk.  Pain now feels like it did previously when she was seen in PT in Jan/Feb.  Has appt with [facility] (previous epidurals) April 1. For neck and for back.    Eval: 09/03/22 Pt  states pain in L leg since August.  Had epidural in spine L5, no help. In September. Also had nerve block. Helped back pain, but still has leg pain.  Also has seen Ortho, now with IT band pain.  Now seeing sports met. She also had coristone shot in hip 1/9.  Having to use cane at times for pain, was using walker when it was really bad.  Can't put pressure on leg. Feels better at night, sleeping through night. Feels back to baseline today after hip injection.  Did not have much PT for this yet.  Most pain with standing/walking and with initial standing.   PERTINENT HISTORY:   PAIN:  Are you having pain? Yes: NPRS scale: up to 7/10 Pain location: L Leg Pain description: sore,  painful Aggravating factors: increased activity, standing, walking , turning to L.  Relieving factors: sitting, rest  PRECAUTIONS: None  WEIGHT BEARING RESTRICTIONS: No  FALLS:  Has patient fallen in last 6 months? No  OCCUPATION:   PLOF: Independent  PATIENT GOALS: decreased pain in leg.    OBJECTIVE:   DIAGNOSTIC FINDINGS:   PATIENT SURVEYS:   COGNITION: Overall cognitive status: Within functional limits for tasks assessed     SENSATION: WFL  POSTURE:    PALPATION: Tenderness in L Low lumbar, SI, glute, into L gr troch. Minimal pain in knee.    LUMBAR ROM:   AROM eval  Flexion wfl  Extension Mod limitation  Right lateral flexion Mild limitation  Left lateral flexion Mild limitation  Right rotation   Left rotation    (Blank rows = not tested)   Hip ROM: WFL  LOWER EXTREMITY MMT   MMT Right eval Left eval  Hip flexion 4 4-  Hip extension    Hip abduction 4 4-  Hip adduction    Hip internal rotation    Hip external rotation    Knee flexion  4+  Knee extension  4+  Ankle dorsiflexion    Ankle plantarflexion    Ankle inversion    Ankle eversion     (Blank rows = not tested)   LUMBAR SPECIAL TESTS:    TODAY'S TREATMENT:  DATE:   11/21/22: Therapeutic Exercise: Aerobic:  Level 1 x 8 min;  Supine: Bridging x 15;  pelvic tilts x 15;  S/L : hip abd 2 x 10 bil;  Seated:    Standing:  marching x 20;   March/walk x4 ( increased pain in leg) ;   Stretches:  Seated HSS 30 sec x 3 on L;  Seated piriformis 30 sec x 2 bil;  Neuromuscular Re-education: Manual Therapy: Lumbar PA mobs, TPR L lumbar paraspinals; long leg distraction on L;   11/19/22: Therapeutic Exercise: Aerobic:  Level 1 x 7 min;  Supine: Seated:   LAQ 4 lb 2 x 10 bil;  Standing:  marching x 20;   staggered stance weight shifts x 10 bil; Step throughs x 15 bil; Step ups 4 in , 1 UE support x 15 bil;  Stretches:  Seated HSS 30 sec x 3 on L; seated fwd flexion x 3;  Seated piriformis 30 sec x 2 bil;  Neuromuscular Re-education: Manual Therapy:   PATIENT EDUCATION:  Education details: Updated and reviewed HEP Person educated: Patient Education method: Explanation, Demonstration, Tactile cues, Verbal cues, and Handouts Education comprehension: verbalized understanding, returned demonstration, verbal cues required, tactile cues required, and needs further education  HOME EXERCISE PROGRAM: Access Code: [email or web address]  Reviewed from previous    ASSESSMENT:  CLINICAL IMPRESSION: Pt with less pain today into LE, and good ability for ther ex and strengthening. She does get onset of symptoms with prolonged marching/standing today, eased with seated stretches. Pt to benefit form continued care.   Eval: Patient presents with primary complaint of increased pain in L leg . She has pain in back, hip and into LE that has been ongoing .She has weakness in hip and LE, and  has decreased ability for sustaining standing activity, walking, and functional activity due to pain . Pt to benefit from skilled PT to improve deficits and pain.   OBJECTIVE IMPAIRMENTS: Abnormal gait, decreased activity  tolerance, decreased knowledge of use of DME, decreased mobility, difficulty walking, decreased ROM, decreased strength, increased muscle spasms, improper body mechanics, and pain.   ACTIVITY LIMITATIONS: carrying, lifting, bending, standing, squatting, stairs, transfers, and locomotion level  PARTICIPATION LIMITATIONS: cleaning, laundry, shopping, and community activity  PERSONAL FACTORS: Time since onset of injury/illness/exacerbation are also affecting patient's functional outcome.   REHAB POTENTIAL: Good  CLINICAL DECISION MAKING: Stable/uncomplicated  EVALUATION COMPLEXITY: Low   GOALS: Goals reviewed with patient? Yes  SHORT TERM GOALS: Target date: 11/25/2022   Pt to be independent with initial HEP  Goal status: INITIAL   LONG TERM GOALS: Target date: 01/06/2023   Pt to be independent with final HEP   Goal status: INITIAL  2.  Pt to report decreased pain in back,  hip and LE to 0-2/10 with standing activity.   Goal status: INITIAL  3.  Pt to demo increased strength of L hip and LE to at least 4+/5 to improve ability for stairs and gait.   Goal status: INITIAL  4.  Pt to report ability for standing activity for at least 30 min with pain 0-3/10.    Goal status: INITIAL   PLAN:  PT FREQUENCY: 1-2x/week  PT DURATION: 8 weeks  PLANNED INTERVENTIONS: Therapeutic exercises, Therapeutic activity, Neuromuscular re-education, Balance training, Gait training, Patient/Family education, Self Care, Joint mobilization, Joint manipulation, Stair training, DME instructions, Aquatic Therapy, Dry Needling, Electrical stimulation, Spinal manipulation, Spinal mobilization, Cryotherapy, Moist heat, Taping, Traction, Ultrasound, Ionotophoresis 4mg /ml Dexamethasone, and Manual therapy.  PLAN  FOR NEXT SESSION:    Sedalia Muta, PT, DPT 8:57 AM  11/22/22

## 2022-11-22 NOTE — Telephone Encounter (Signed)
Pt is upset and wants a phone call back about what is going go with her prolia she stated this have been going on since march. Pt stated if she don't hear nothing back soon she will come in office.

## 2022-11-22 NOTE — Telephone Encounter (Signed)
Prolia VOB initiated via [email or web address] 

## 2022-11-25 ENCOUNTER — Ambulatory Visit: Payer: Medicare HMO | Admitting: Physical Therapy

## 2022-11-25 ENCOUNTER — Encounter: Payer: Self-pay | Admitting: Physical Therapy

## 2022-11-25 DIAGNOSIS — M25552 Pain in left hip: Secondary | ICD-10-CM | POA: Diagnosis not present

## 2022-11-25 DIAGNOSIS — M5459 Other low back pain: Secondary | ICD-10-CM

## 2022-11-25 NOTE — Therapy (Addendum)
OUTPATIENT PHYSICAL THERAPY THORACOLUMBAR TREATMENT   Patient Name: Emily [name] MRN: [id number] DOB:[birth year], 82 y.o., female Today's Date: 11/25/22  END OF SESSION:  PT End of Session - 11/27/22 0925     Visit Number 4    Number of Visits 16    Date for PT Re-Evaluation 01/06/23    Authorization Type [employer] Medicare    PT Start Time [phone]    PT Stop Time 0934    PT Time Calculation (min) 42 min    Activity Tolerance Patient tolerated treatment well    Behavior During Therapy WFL for tasks assessed/performed                Past Medical History:  Diagnosis Date   Actinic keratosis    Allergic rhinitis    BACK PAIN    CARCINOMA, BREAST, 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   L s/p lumpectomy, chemo and XRT   Cervicalgia    Cholelithiasis    DIVERTICULOSIS, COLON    ENDOMETRIOSIS    GERD (gastroesophageal reflux disease)    Heart palpitations    Hypertension    HYPOTHYROIDISM    Left wrist fracture 11/06/2016   10/2016 - fell of high stool   Osteoporosis    Personal history of chemotherapy    Personal history of radiation therapy    Pleural plaque without asbestos    CT chest 10/2010: R>L lobes, upper> lower   Squamous cell carcinoma    History of BC   Past Surgical History:  Procedure Laterality Date   BREAST LUMPECTOMY  2001   left   CATARACT EXTRACTION Bilateral 09/2014   both eyes   CHOLECYSTECTOMY  2010   ENDOMETRIAL ABLATION     EXCISION / BIOPSY BREAST / NIPPLE / DUCT Left 2001   Patient Active Problem List   Diagnosis Date Noted   Agatston coronary artery calcium score 22.4  (12/2021) 10/30/2022   Urge incontinence of urine 10/30/2022   Word finding difficulty 10/30/2022   Chronic pain of left knee 08/21/2022   Lumbar radiculopathy 03/25/2022   Cough 12/27/2021   Sleep difficulties 09/27/2021   Cervicogenic headache 04/10/2021   Elevated blood pressure reading 04/10/2021   Skin abnormality 02/27/2021   Aortic atherosclerosis 09/22/2020    Urinary urgency 03/23/2020   CKD (chronic kidney disease) stage 3, GFR 30-59 ml/min 03/22/2020   Prediabetes 03/22/2020   Change in stool 11/17/2019   Right-sided chest wall pain 11/17/2019   Urinary retention 09/23/2019   SOB (shortness of breath) 01/15/2019   Upper airway cough syndrome 01/15/2019   Belching 12/14/2018   Reactive airway disease 12/05/2018   Tear of left rotator cuff 10/05/2018   Pre-syncope 05/19/2018   DDD (degenerative disc disease), cervical 01/15/2018   Neuralgia 09/12/2017   Presbycusis of both ears 12/11/2016   Memory difficulties 11/06/2016   IBS (irritable bowel syndrome) 04/16/2016   Hyperlipidemia 09/10/2015   Osteoporosis 09/08/2015   Palpitations    Pleural plaque without asbestos 12/03/2010   Chronic neck pain 12/18/2009   DIVERTICULOSIS, COLON 03/02/2009   Lumbago 03/11/2008   Actinic keratosis 01/23/2008   CARCINOMA, BREAST, ESTROGEN RECEPTOR NEGATIVE 10/12/2007   Hypothyroidism 06/18/2007   Allergic rhinitis 06/11/2007    PCP: Cheryll Cockayne  REFERRING PROVIDER: [name]   REFERRING DIAG: L knee pain   Rationale for Evaluation and Treatment: Rehabilitation  THERAPY DIAG:  Other low back pain  Pain in left hip  ONSET DATE:   SUBJECTIVE:  SUBJECTIVE STATEMENT: Pt states minimal pain today. States more pain when she is walking a lot.   Eval: 11/11/2022  Pt States pain in L hip down leg.  She almost fell a couple weeks ago, and had increased pain in knee at the time. She Had shot in L knee, feels it helped some. Saturday was very bad , had to use cane to walk.  Pain now feels like it did previously when she was seen in PT in Jan/Feb.  Has appt with [facility] (previous epidurals) April 1. For neck and for back.    Eval: 09/03/22 Pt states pain in L leg  since August.  Had epidural in spine L5, no help. In September. Also had nerve block. Helped back pain, but still has leg pain.  Also has seen Ortho, now with IT band pain.  Now seeing sports met. She also had coristone shot in hip 1/9.  Having to use cane at times for pain, was using walker when it was really bad.  Can't put pressure on leg. Feels better at night, sleeping through night. Feels back to baseline today after hip injection.  Did not have much PT for this yet.  Most pain with standing/walking and with initial standing.   PERTINENT HISTORY:   PAIN:  Are you having pain? Yes: NPRS scale: up to 7/10 Pain location: L Leg Pain description: sore,  painful Aggravating factors: increased activity, standing, walking , turning to L.  Relieving factors: sitting, rest  PRECAUTIONS: None  WEIGHT BEARING RESTRICTIONS: No  FALLS:  Has patient fallen in last 6 months? No  OCCUPATION:   PLOF: Independent  PATIENT GOALS: decreased pain in leg.    OBJECTIVE:   DIAGNOSTIC FINDINGS:   PATIENT SURVEYS:   COGNITION: Overall cognitive status: Within functional limits for tasks assessed     SENSATION: WFL  POSTURE:    PALPATION: Tenderness in L Low lumbar, SI, glute, into L gr troch. Minimal pain in knee.    LUMBAR ROM:   AROM eval  Flexion wfl  Extension Mod limitation  Right lateral flexion Mild limitation  Left lateral flexion Mild limitation  Right rotation   Left rotation    (Blank rows = not tested)   Hip ROM: WFL  LOWER EXTREMITY MMT   MMT Right eval Left eval  Hip flexion 4 4-  Hip extension    Hip abduction 4 4-  Hip adduction    Hip internal rotation    Hip external rotation    Knee flexion  4+  Knee extension  4+  Ankle dorsiflexion    Ankle plantarflexion    Ankle inversion    Ankle eversion     (Blank rows = not tested)   LUMBAR SPECIAL TESTS:    TODAY'S TREATMENT:  DATE:   11/25/22: Therapeutic Exercise: Aerobic:  Level 1 x 8 min;  Supine:  SLR 2 x 10 on L, 1 x 10 on R;   Seated:   sit to stand x 10;  Standing:  marching x 20;   hip abd and ext 3 x 5 ea bil;   step ups 4 in x 10 bil; ( pain on L)  Stretches:  Seated HSS 30 sec x 3 on L;  Seated piriformis 30 sec x 2 bil;  Neuromuscular Re-education: Manual Therapy:  long leg distraction on L;    11/21/22: Therapeutic Exercise: Aerobic:  Level 1 x 8 min;  Supine: Bridging x 15;  pelvic tilts x 15;  S/L : hip abd 2 x 10 bil;  Seated:    Standing:  marching x 20;   March/walk x4 ( increased pain in leg) ;   Stretches:  Seated HSS 30 sec x 3 on L;  Seated piriformis 30 sec x 2 bil;  Neuromuscular Re-education: Manual Therapy: Lumbar PA mobs, TPR L lumbar paraspinals; long leg distraction on L;   11/19/22: Therapeutic Exercise: Aerobic:  Level 1 x 7 min;  Supine: Seated:   LAQ 4 lb 2 x 10 bil;  Standing:  marching x 20;   staggered stance weight shifts x 10 bil; Step throughs x 15 bil; Step ups 4 in , 1 UE support x 15 bil;  Stretches:  Seated HSS 30 sec x 3 on L; seated fwd flexion x 3;  Seated piriformis 30 sec x 2 bil;  Neuromuscular Re-education: Manual Therapy:   PATIENT EDUCATION:  Education details: Updated and reviewed HEP Person educated: Patient Education method: Explanation, Demonstration, Tactile cues, Verbal cues, and Handouts Education comprehension: verbalized understanding, returned demonstration, verbal cues required, tactile cues required, and needs further education  HOME EXERCISE PROGRAM: Access Code: [email or web address]  Reviewed from previous    ASSESSMENT:  CLINICAL IMPRESSION: Pt with less pain today into LE, and good ability for ther ex and strengthening. She does get onset of symptoms with trail for steps ups/stairs today. Will continue to benefit from strength for core, hips and decompression for lumbar spine.    Eval: Patient presents with primary complaint of increased pain in L leg . She has pain in back, hip and into LE that has been ongoing .She has weakness in hip and LE, and  has decreased ability for sustaining standing activity, walking, and functional activity due to pain . Pt to benefit from skilled PT to improve deficits and pain.   OBJECTIVE IMPAIRMENTS: Abnormal gait, decreased activity tolerance, decreased knowledge of use of DME, decreased mobility, difficulty walking, decreased ROM, decreased strength, increased muscle spasms, improper body mechanics, and pain.   ACTIVITY LIMITATIONS: carrying, lifting, bending, standing, squatting, stairs, transfers, and locomotion level  PARTICIPATION LIMITATIONS: cleaning, laundry, shopping, and community activity  PERSONAL FACTORS: Time since onset of injury/illness/exacerbation are also affecting patient's functional outcome.   REHAB POTENTIAL: Good  CLINICAL DECISION MAKING: Stable/uncomplicated  EVALUATION COMPLEXITY: Low   GOALS: Goals reviewed with patient? Yes  SHORT TERM GOALS: Target date: 11/25/2022   Pt to be independent with initial HEP  Goal status: INITIAL   LONG TERM GOALS: Target date: 01/06/2023   Pt to be independent with final HEP   Goal status: INITIAL  2.  Pt to report decreased pain in back,  hip and LE to 0-2/10 with standing activity.   Goal status: INITIAL  3.  Pt to demo increased strength of L hip  and LE to at least 4+/5 to improve ability for stairs and gait.   Goal status: INITIAL  4.  Pt to report ability for standing activity for at least 30 min with pain 0-3/10.    Goal status: INITIAL   PLAN:  PT FREQUENCY: 1-2x/week  PT DURATION: 8 weeks  PLANNED INTERVENTIONS: Therapeutic exercises, Therapeutic activity, Neuromuscular re-education, Balance training, Gait training, Patient/Family education, Self Care, Joint mobilization, Joint manipulation, Stair training, DME instructions, Aquatic  Therapy, Dry Needling, Electrical stimulation, Spinal manipulation, Spinal mobilization, Cryotherapy, Moist heat, Taping, Traction, Ultrasound, Ionotophoresis 4mg /ml Dexamethasone, and Manual therapy.  PLAN FOR NEXT SESSION:    Sedalia Muta, PT, DPT 9:25 AM  11/27/22  PHYSICAL THERAPY DISCHARGE SUMMARY  Visits from Start of Care: 4   Plan: Patient agrees to discharge.  Patient goals were partially  met. Patient is being discharged due to  - not returning since last visit.    Sedalia Muta, PT, DPT 1:07 PM  03/27/23

## 2022-11-27 ENCOUNTER — Encounter: Payer: Medicare HMO | Admitting: Physical Therapy

## 2022-11-27 ENCOUNTER — Encounter: Payer: Self-pay | Admitting: Physical Therapy

## 2022-11-28 ENCOUNTER — Encounter: Payer: Self-pay | Admitting: Internal Medicine

## 2022-11-29 ENCOUNTER — Other Ambulatory Visit (HOSPITAL_COMMUNITY): Payer: Self-pay

## 2022-11-29 ENCOUNTER — Telehealth: Payer: Self-pay

## 2022-11-29 NOTE — Telephone Encounter (Signed)
Pharmacy Patient Advocate Encounter   PA for Prolia submitted on 11/29/22 to (ins) [employer] via fax at [phone] (for buy and bill)  Phone # [phone]   Status is pending

## 2022-11-29 NOTE — Telephone Encounter (Signed)
Pharmacy Patient Advocate Encounter  Insurance verification completed.    The patient is insured through [employer] for: Prolia 60mg .  Pharmacy benefit copay: $100.00   **This test claim was processed through [facility]- copay amounts may vary at other pharmacies due to pharmacy/plan contracts, or as the patient moves through the different stages of their insurance plan.**

## 2022-12-02 ENCOUNTER — Telehealth: Payer: Self-pay | Admitting: Internal Medicine

## 2022-12-02 NOTE — Telephone Encounter (Signed)
Patient would like to talk to you about PT and injections

## 2022-12-03 ENCOUNTER — Encounter: Payer: Medicare HMO | Admitting: Physical Therapy

## 2022-12-05 ENCOUNTER — Encounter: Payer: Medicare HMO | Admitting: Physical Therapy

## 2022-12-09 ENCOUNTER — Encounter: Payer: Medicare HMO | Admitting: Physical Therapy

## 2022-12-10 DIAGNOSIS — M47812 Spondylosis without myelopathy or radiculopathy, cervical region: Secondary | ICD-10-CM | POA: Diagnosis not present

## 2022-12-11 ENCOUNTER — Encounter: Payer: Medicare HMO | Admitting: Physical Therapy

## 2022-12-12 ENCOUNTER — Encounter: Payer: Self-pay | Admitting: Internal Medicine

## 2022-12-12 NOTE — Telephone Encounter (Signed)
Pt ready for scheduling for Prolia on or after : 12/12/22  Out-of-pocket cost due at time of visit: $322  Primary: [employer] - Medicare Prolia co-insurance: 20% Admin fee co-insurance: $20  Secondary: N/A Prolia co-insurance:  Admin fee co-insurance:   Medical Benefit Details: Date Benefits were checked: 11/22/22 Deductible: no/ Coinsurance: 20%/ Admin Fee: $20  Prior Auth: approved PA# [id number] Expiration Date: 11/29/22 to 11/29/23   Pharmacy benefit: Copay $100 If patient wants fill through the pharmacy benefit please send prescription to: [employer], and include estimated need by date in rx notes. Pharmacy will ship medication directly to the office.  Patient not eligible for Prolia Copay Card. Copay Card can make patient's cost as little as $25. Link to apply: [email or web address]  ** This summary of benefits is an estimation of the patient's out-of-pocket cost. Exact cost may very based on individual plan coverage.

## 2022-12-12 NOTE — Telephone Encounter (Signed)
Responded to patient via mychart

## 2022-12-18 ENCOUNTER — Telehealth: Payer: Self-pay | Admitting: Internal Medicine

## 2022-12-18 NOTE — Telephone Encounter (Signed)
Contacted Emily [name] to schedule their annual wellness visit. Appointment made for 02/06/2023.  [facility] Care Guide [facility] AW[facility] TEAM Direct Dial: [phone]

## 2022-12-19 ENCOUNTER — Other Ambulatory Visit: Payer: Self-pay | Admitting: Internal Medicine

## 2022-12-19 DIAGNOSIS — Z1231 Encounter for screening mammogram for malignant neoplasm of breast: Secondary | ICD-10-CM

## 2022-12-24 DIAGNOSIS — M5416 Radiculopathy, lumbar region: Secondary | ICD-10-CM | POA: Diagnosis not present

## 2022-12-31 ENCOUNTER — Encounter: Payer: Self-pay | Admitting: Internal Medicine

## 2022-12-31 NOTE — Progress Notes (Unsigned)
    Subjective:    Patient ID: Emily [name], female    DOB: [birth year], 82 y.o.   MRN: [id number]      HPI Clairese is here for No chief complaint on file.    Seeing ortho and sports medicine for lumbar radiculopathy, headaches - occipital neuralgia and hip pain.  Has had injections in back  - helped temporarily.      Medications and allergies reviewed with patient and updated if appropriate.  Current Outpatient Medications on File Prior to Visit  Medication Sig Dispense Refill   Acetaminophen (TYLENOL 8 HOUR ARTHRITIS PAIN PO) Take by mouth.     atenolol (TENORMIN) 25 MG tablet TAKE 1 TABLET BY MOUTH EVERY DAY 90 tablet 1   Calcium-Vitamin D-Vitamin K 750-500-40 MG-UNT-MCG TABS Take 1 tablet by mouth 2 (two) times daily.     chlorpheniramine (CHLOR-TRIMETON) 4 MG tablet Take 4 mg by mouth 2 (two) times daily as needed for allergies.     cholecalciferol (VITAMIN D) 25 MCG (1000 UNIT) tablet Take 1,000 Units by mouth daily.     [employer] ALLERGY RELIEF D 5-120 MG tablet TAKE 1 TABLET BY MOUTH TWICE A DAY 48 tablet 14   EPINEPHrine 0.3 mg/0.3 mL IJ SOAJ injection Inject 0.3 mg into the muscle as needed for anaphylaxis. 1 each 0   gabapentin (NEURONTIN) 100 MG capsule Take 1 capsule (100 mg total) by mouth at bedtime. Take 100-300mg  at bedtime 30 capsule 3   ipratropium (ATROVENT) 0.03 % nasal spray SPRAY 2 SPRAYS INTO EACH NOSTRIL EVERY 12 HOURS 30 mL 2   levothyroxine (SYNTHROID) 75 MCG tablet TAKE 1 TABLET BY MOUTH EVERY DAY 90 tablet 3   triamcinolone (KENALOG) 0.025 % ointment Apply 1 application topically 2 (two) times daily. Use as needed. 30 g 1   No current facility-administered medications on file prior to visit.    Review of Systems     Objective:  There were no vitals filed for this visit. BP Readings from Last 3 Encounters:  10/31/22 138/84  10/30/22 122/70  10/22/22 120/84   Wt Readings from Last 3 Encounters:  10/31/22 126 lb 12.8 oz (57.5 kg)  10/30/22 125 lb  12.8 oz (57.1 kg)  10/22/22 124 lb 12.8 oz (56.6 kg)   There is no height or weight on file to calculate BMI.    Physical Exam         Assessment & Plan:    See Problem List for Assessment and Plan of chronic medical problems.

## 2023-01-01 ENCOUNTER — Ambulatory Visit (INDEPENDENT_AMBULATORY_CARE_PROVIDER_SITE_OTHER): Payer: Medicare HMO | Admitting: Internal Medicine

## 2023-01-01 VITALS — BP 136/82 | HR 67 | Temp 98.0°F | Ht 61.0 in | Wt 127.6 lb

## 2023-01-01 DIAGNOSIS — R413 Other amnesia: Secondary | ICD-10-CM

## 2023-01-01 DIAGNOSIS — F419 Anxiety disorder, unspecified: Secondary | ICD-10-CM

## 2023-01-01 MED ORDER — SERTRALINE HCL 25 MG PO TABS: 25.0000 mg | ORAL_TABLET | Freq: Every day | ORAL | 5 refills | Status: DC

## 2023-01-01 NOTE — Assessment & Plan Note (Signed)
Acute on chronic Has had anxiety for a while, but recently it has gotten much worse Recently there is a lot of things all at once and I think that is what really caused almost a panic attack yesterday Discussed options Start sertraline 25 mg daily.  Will have her follow-up in 4 weeks and consider adjusting her medication dose if needed She will call me in the next few weeks with any concerns or questions

## 2023-01-01 NOTE — Assessment & Plan Note (Signed)
Chronic She continues to have some memory issues and this is still concerning She did see neuropsychology in 2019 and at that time her anxiety was thought to be influencing some of her memory issues and I find that that probably is still an issue She also needs new hearing aids and that could be contributing to some of the memory issues Discussed referral to neuropsychology again-she will let me know if she wants to do that, but at this point we will treat her anxiety and see if that helps

## 2023-01-01 NOTE — Patient Instructions (Addendum)
       Medications changes include :   sertraline 25 mg daily     Return in about 4 weeks (around 01/29/2023) for follow up.

## 2023-01-02 ENCOUNTER — Telehealth: Payer: Self-pay | Admitting: Internal Medicine

## 2023-01-02 DIAGNOSIS — Z0100 Encounter for examination of eyes and vision without abnormal findings: Secondary | ICD-10-CM | POA: Diagnosis not present

## 2023-01-02 NOTE — Telephone Encounter (Signed)
Patient has taken one dose of her new prescription of sertraline (ZOLOFT) 25 MG tablet . She said it made her nauseous and said she would stop taking it. She wanted to know if Dr. Lawerance Bach would recommend anything else. Best callback is [phone].

## 2023-01-03 MED ORDER — FLUOXETINE HCL 10 MG PO CAPS: 10.0000 mg | ORAL_CAPSULE | Freq: Every day | ORAL | 3 refills | Status: DC

## 2023-01-03 NOTE — Telephone Encounter (Signed)
Lets try a different one.  Fluoxetine sent to pharmacy.  If there is any mild nausea if she can take it for a couple of days sometimes that will go away.

## 2023-01-03 NOTE — Telephone Encounter (Signed)
Noted-fluoxetine taken off med list

## 2023-01-14 DIAGNOSIS — M47812 Spondylosis without myelopathy or radiculopathy, cervical region: Secondary | ICD-10-CM | POA: Diagnosis not present

## 2023-01-14 NOTE — Progress Notes (Signed)
    Subjective:    Patient ID: Emily [name], female    DOB: [birth year], 82 y.o.   MRN: [id number]      HPI Ali is here for No chief complaint on file.   Loose stools, loss of bowel control -      Medications and allergies reviewed with patient and updated if appropriate.  Current Outpatient Medications on File Prior to Visit  Medication Sig Dispense Refill   Acetaminophen (TYLENOL 8 HOUR ARTHRITIS PAIN PO) Take by mouth.     atenolol (TENORMIN) 25 MG tablet TAKE 1 TABLET BY MOUTH EVERY DAY 90 tablet 1   Calcium-Vitamin D-Vitamin K 750-500-40 MG-UNT-MCG TABS Take 1 tablet by mouth 2 (two) times daily.     chlorpheniramine (CHLOR-TRIMETON) 4 MG tablet Take 4 mg by mouth 2 (two) times daily as needed for allergies.     cholecalciferol (VITAMIN D) 25 MCG (1000 UNIT) tablet Take 1,000 Units by mouth daily.     [facility] ALLERGY RELIEF D 5-120 MG tablet TAKE 1 TABLET BY MOUTH TWICE A DAY 48 tablet 14   EPINEPHrine 0.3 mg/0.3 mL IJ SOAJ injection Inject 0.3 mg into the muscle as needed for anaphylaxis. 1 each 0   gabapentin (NEURONTIN) 100 MG capsule Take 1 capsule (100 mg total) by mouth at bedtime. Take 100-300mg  at bedtime 30 capsule 3   ipratropium (ATROVENT) 0.03 % nasal spray SPRAY 2 SPRAYS INTO EACH NOSTRIL EVERY 12 HOURS 30 mL 2   levothyroxine (SYNTHROID) 75 MCG tablet TAKE 1 TABLET BY MOUTH EVERY DAY 90 tablet 3   triamcinolone (KENALOG) 0.025 % ointment Apply 1 application topically 2 (two) times daily. Use as needed. 30 g 1   No current facility-administered medications on file prior to visit.    Review of Systems     Objective:  There were no vitals filed for this visit. BP Readings from Last 3 Encounters:  01/01/23 136/82  10/31/22 138/84  10/30/22 122/70   Wt Readings from Last 3 Encounters:  01/01/23 127 lb 9.6 oz (57.9 kg)  10/31/22 126 lb 12.8 oz (57.5 kg)  10/30/22 125 lb 12.8 oz (57.1 kg)   There is no height or weight on file to calculate BMI.     Physical Exam         Assessment & Plan:    See Problem List for Assessment and Plan of chronic medical problems.

## 2023-01-14 NOTE — Patient Instructions (Signed)
    Try increasing your fiber - try benefiber and use the imodium as needed        Return if symptoms worsen or fail to improve.

## 2023-01-15 ENCOUNTER — Ambulatory Visit (INDEPENDENT_AMBULATORY_CARE_PROVIDER_SITE_OTHER): Payer: Medicare HMO | Admitting: Internal Medicine

## 2023-01-15 ENCOUNTER — Encounter: Payer: Self-pay | Admitting: Internal Medicine

## 2023-01-15 VITALS — BP 124/72 | HR 55 | Temp 98.0°F | Ht 61.0 in | Wt 135.0 lb

## 2023-01-15 DIAGNOSIS — R194 Change in bowel habit: Secondary | ICD-10-CM

## 2023-01-15 NOTE — Assessment & Plan Note (Signed)
Acute Started about two weeks ago Has normal BM in am - soft stool then a few hours later has  gas and loose stool then no additional BM/s during the day Doubt infection No travel, change in meds May be eating less fiber Trial of benefiber daily to see if that helps Can take imodium prn but be careful to avoid constipation Call if no improvement

## 2023-01-23 ENCOUNTER — Ambulatory Visit (INDEPENDENT_AMBULATORY_CARE_PROVIDER_SITE_OTHER): Payer: Medicare HMO

## 2023-01-23 DIAGNOSIS — M810 Age-related osteoporosis without current pathological fracture: Secondary | ICD-10-CM | POA: Diagnosis not present

## 2023-01-23 MED ORDER — DENOSUMAB 60 MG/ML ~~LOC~~ SOSY
60.0000 mg | PREFILLED_SYRINGE | Freq: Once | SUBCUTANEOUS | Status: AC
  Administered 2023-01-23: 60 mg via SUBCUTANEOUS

## 2023-01-23 NOTE — Progress Notes (Signed)
Pt was given Prolia injection with no complications.

## 2023-01-30 DIAGNOSIS — L821 Other seborrheic keratosis: Secondary | ICD-10-CM | POA: Diagnosis not present

## 2023-01-30 DIAGNOSIS — L578 Other skin changes due to chronic exposure to nonionizing radiation: Secondary | ICD-10-CM | POA: Diagnosis not present

## 2023-01-30 DIAGNOSIS — D2272 Melanocytic nevi of left lower limb, including hip: Secondary | ICD-10-CM | POA: Diagnosis not present

## 2023-01-30 DIAGNOSIS — D229 Melanocytic nevi, unspecified: Secondary | ICD-10-CM | POA: Diagnosis not present

## 2023-01-30 DIAGNOSIS — D2261 Melanocytic nevi of right upper limb, including shoulder: Secondary | ICD-10-CM | POA: Diagnosis not present

## 2023-01-30 DIAGNOSIS — D2230 Melanocytic nevi of unspecified part of face: Secondary | ICD-10-CM | POA: Diagnosis not present

## 2023-01-30 DIAGNOSIS — D225 Melanocytic nevi of trunk: Secondary | ICD-10-CM | POA: Diagnosis not present

## 2023-01-30 DIAGNOSIS — D485 Neoplasm of uncertain behavior of skin: Secondary | ICD-10-CM | POA: Diagnosis not present

## 2023-02-01 DIAGNOSIS — M48061 Spinal stenosis, lumbar region without neurogenic claudication: Secondary | ICD-10-CM | POA: Diagnosis not present

## 2023-02-01 DIAGNOSIS — M5416 Radiculopathy, lumbar region: Secondary | ICD-10-CM | POA: Diagnosis not present

## 2023-02-01 DIAGNOSIS — M5030 Other cervical disc degeneration, unspecified cervical region: Secondary | ICD-10-CM | POA: Diagnosis not present

## 2023-02-01 DIAGNOSIS — M47812 Spondylosis without myelopathy or radiculopathy, cervical region: Secondary | ICD-10-CM | POA: Diagnosis not present

## 2023-02-06 ENCOUNTER — Ambulatory Visit (INDEPENDENT_AMBULATORY_CARE_PROVIDER_SITE_OTHER): Payer: Medicare HMO

## 2023-02-06 VITALS — BP 102/70 | HR 61 | Temp 98.1°F | Ht 61.0 in | Wt 127.6 lb

## 2023-02-06 DIAGNOSIS — Z0000 Encounter for general adult medical examination without abnormal findings: Secondary | ICD-10-CM

## 2023-02-06 NOTE — Patient Instructions (Addendum)
Emily [name] , Thank you for taking time to come for your Medicare Wellness Visit. I appreciate your ongoing commitment to your health goals. Please review the following plan we discussed and let me know if I can assist you in the future.   These are the goals we discussed:  Goals      My goal for 2024 is to get rid of my left hip pain and walk better.        This is a list of the screening recommended for you and due dates:  Health Maintenance  Topic Date Due   Flu Shot  03/20/2023   Medicare Annual Wellness Visit  02/06/2024   DEXA scan (bone density measurement)  03/15/2024   DTaP/Tdap/Td vaccine (3 - Td or Tdap) 07/07/2027   Pneumonia Vaccine  Completed   Zoster (Shingles) Vaccine  Completed   HPV Vaccine  Aged Out   COVID-19 Vaccine  Discontinued   Hepatitis C Screening  Discontinued    Advanced directives: Yes  Conditions/risks identified: Yes  Next appointment: Follow up in one year for your annual wellness visit by calling [phone].   Preventive Care 79 Years and Older, Female Preventive care refers to lifestyle choices and visits with your health care provider that can promote health and wellness. What does preventive care include? A yearly physical exam. This is also called an annual well check. Dental exams once or twice a year. Routine eye exams. Ask your health care provider how often you should have your eyes checked. Personal lifestyle choices, including: Daily care of your teeth and gums. Regular physical activity. Eating a healthy diet. Avoiding tobacco and drug use. Limiting alcohol use. Practicing safe sex. Taking low-dose aspirin every day. Taking vitamin and mineral supplements as recommended by your health care provider. What happens during an annual well check? The services and screenings done by your health care provider during your annual well check will depend on your age, overall health, lifestyle risk factors, and family history of  disease. Counseling  Your health care provider may ask you questions about your: Alcohol use. Tobacco use. Drug use. Emotional well-being. Home and relationship well-being. Sexual activity. Eating habits. History of falls. Memory and ability to understand (cognition). Work and work Astronomer. Reproductive health. Screening  You may have the following tests or measurements: Height, weight, and BMI. Blood pressure. Lipid and cholesterol levels. These may be checked every 5 years, or more frequently if you are over 45 years old. Skin check. Lung cancer screening. You may have this screening every year starting at age 73 if you have a 30-pack-year history of smoking and currently smoke or have quit within the past 15 years. Fecal occult blood test (FOBT) of the stool. You may have this test every year starting at age 8. Flexible sigmoidoscopy or colonoscopy. You may have a sigmoidoscopy every 5 years or a colonoscopy every 10 years starting at age 24. Hepatitis C blood test. Hepatitis B blood test. Sexually transmitted disease (STD) testing. Diabetes screening. This is done by checking your blood sugar (glucose) after you have not eaten for a while (fasting). You may have this done every 1-3 years. Bone density scan. This is done to screen for osteoporosis. You may have this done starting at age 10. Mammogram. This may be done every 1-2 years. Talk to your health care provider about how often you should have regular mammograms. Talk with your health care provider about your test results, treatment options, and if necessary, the need  for more tests. Vaccines  Your health care provider may recommend certain vaccines, such as: Influenza vaccine. This is recommended every year. Tetanus, diphtheria, and acellular pertussis (Tdap, Td) vaccine. You may need a Td booster every 10 years. Zoster vaccine. You may need this after age 85. Pneumococcal 13-valent conjugate (PCV13) vaccine. One  dose is recommended after age 66. Pneumococcal polysaccharide (PPSV23) vaccine. One dose is recommended after age 18. Talk to your health care provider about which screenings and vaccines you need and how often you need them. This information is not intended to replace advice given to you by your health care provider. Make sure you discuss any questions you have with your health care provider. Document Released: 09/01/2015 Document Revised: 04/24/2016 Document Reviewed: 06/06/2015 Elsevier Interactive Patient Education  2017 [place] Prevention in the Home Falls can cause injuries. They can happen to people of all ages. There are many things you can do to make your home safe and to help prevent falls. What can I do on the outside of my home? Regularly fix the edges of walkways and driveways and fix any cracks. Remove anything that might make you trip as you walk through a door, such as a raised step or threshold. Trim any bushes or trees on the path to your home. Use bright outdoor lighting. Clear any walking paths of anything that might make someone trip, such as rocks or tools. Regularly check to see if handrails are loose or broken. Make sure that both sides of any steps have handrails. Any raised decks and porches should have guardrails on the edges. Have any leaves, snow, or ice cleared regularly. Use sand or salt on walking paths during winter. Clean up any spills in your garage right away. This includes oil or grease spills. What can I do in the bathroom? Use night lights. Install grab bars by the toilet and in the tub and shower. Do not use towel bars as grab bars. Use non-skid mats or decals in the tub or shower. If you need to sit down in the shower, use a plastic, non-slip stool. Keep the floor dry. Clean up any water that spills on the floor as soon as it happens. Remove soap buildup in the tub or shower regularly. Attach bath mats securely with double-sided  non-slip rug tape. Do not have throw rugs and other things on the floor that can make you trip. What can I do in the bedroom? Use night lights. Make sure that you have a light by your bed that is easy to reach. Do not use any sheets or blankets that are too big for your bed. They should not hang down onto the floor. Have a firm chair that has side arms. You can use this for support while you get dressed. Do not have throw rugs and other things on the floor that can make you trip. What can I do in the kitchen? Clean up any spills right away. Avoid walking on wet floors. Keep items that you use a lot in easy-to-reach places. If you need to reach something above you, use a strong step stool that has a grab bar. Keep electrical cords out of the way. Do not use floor polish or wax that makes floors slippery. If you must use wax, use non-skid floor wax. Do not have throw rugs and other things on the floor that can make you trip. What can I do with my stairs? Do not leave any items on the stairs.  Make sure that there are handrails on both sides of the stairs and use them. Fix handrails that are broken or loose. Make sure that handrails are as long as the stairways. Check any carpeting to make sure that it is firmly attached to the stairs. Fix any carpet that is loose or worn. Avoid having throw rugs at the top or bottom of the stairs. If you do have throw rugs, attach them to the floor with carpet tape. Make sure that you have a light switch at the top of the stairs and the bottom of the stairs. If you do not have them, ask someone to add them for you. What else can I do to help prevent falls? Wear shoes that: Do not have high heels. Have rubber bottoms. Are comfortable and fit you well. Are closed at the toe. Do not wear sandals. If you use a stepladder: Make sure that it is fully opened. Do not climb a closed stepladder. Make sure that both sides of the stepladder are locked into place. Ask  someone to hold it for you, if possible. Clearly mark and make sure that you can see: Any grab bars or handrails. First and last steps. Where the edge of each step is. Use tools that help you move around (mobility aids) if they are needed. These include: Canes. Walkers. Scooters. Crutches. Turn on the lights when you go into a dark area. Replace any light bulbs as soon as they burn out. Set up your furniture so you have a clear path. Avoid moving your furniture around. If any of your floors are uneven, fix them. If there are any pets around you, be aware of where they are. Review your medicines with your doctor. Some medicines can make you feel dizzy. This can increase your chance of falling. Ask your doctor what other things that you can do to help prevent falls. This information is not intended to replace advice given to you by your health care provider. Make sure you discuss any questions you have with your health care provider. Document Released: 06/01/2009 Document Revised: 01/11/2016 Document Reviewed: 09/09/2014 Elsevier Interactive Patient Education  2017 [employer].

## 2023-02-06 NOTE — Progress Notes (Signed)
Subjective:   Cicilia Maseda is a 82 y.o. female who presents for Medicare Annual (Subsequent) preventive examination.  Visit Complete: In person  Review of Systems     Cardiac Risk Factors include: advanced age (>84men, >32 women);family history of premature cardiovascular disease;hypertension     Objective:    Today's Vitals   02/06/23 1015  BP: 102/70  Pulse: 61  Temp: 98.1 F (36.7 C)  SpO2: 98%  Weight: 127 lb 9.6 oz (57.9 kg)  Height: 5\' 1"  (1.549 m)  PainSc: 4   PainLoc: Hip   Body mass index is 24.11 kg/m.     02/06/2023   10:27 AM 02/06/2023   10:16 AM 09/03/2022   11:01 AM 01/22/2022    9:35 AM 12/04/2021    8:50 AM 09/26/2021    9:33 AM 05/23/2021    2:20 PM  Advanced Directives  Does Patient Have a Medical Advance Directive? Yes Yes No Yes No Yes Yes  Type of Estate agent of [place];Living will Healthcare Power of [place];Living will  Living will;Healthcare Power of [employer] Power of [place];Living will Healthcare Power of [place];Living will  Does patient want to make changes to medical advance directive?    No - Patient declined  No - Patient declined   Copy of Healthcare Power of Attorney in Chart? No - copy requested No - copy requested  No - copy requested  Yes - validated most recent copy scanned in chart (See row information)   Would patient like information on creating a medical advance directive?   No - Patient declined  No - Patient declined      Current Medications (verified) Outpatient Encounter Medications as of 02/06/2023  Medication Sig   Acetaminophen (TYLENOL 8 HOUR ARTHRITIS PAIN PO) Take by mouth.   atenolol (TENORMIN) 25 MG tablet TAKE 1 TABLET BY MOUTH EVERY DAY   Calcium-Vitamin D-Vitamin K 750-500-40 MG-UNT-MCG TABS Take 1 tablet by mouth 2 (two) times daily.   chlorpheniramine (CHLOR-TRIMETON) 4 MG tablet Take 4 mg by mouth 2 (two) times daily as needed for allergies.   cholecalciferol (VITAMIN  D) 25 MCG (1000 UNIT) tablet Take 1,000 Units by mouth daily.   [employer] ALLERGY RELIEF D 5-120 MG tablet TAKE 1 TABLET BY MOUTH TWICE A DAY   EPINEPHrine 0.3 mg/0.3 mL IJ SOAJ injection Inject 0.3 mg into the muscle as needed for anaphylaxis.   gabapentin (NEURONTIN) 100 MG capsule Take 1 capsule (100 mg total) by mouth at bedtime. Take 100-300mg  at bedtime   ipratropium (ATROVENT) 0.03 % nasal spray SPRAY 2 SPRAYS INTO EACH NOSTRIL EVERY 12 HOURS   levothyroxine (SYNTHROID) 75 MCG tablet TAKE 1 TABLET BY MOUTH EVERY DAY   triamcinolone (KENALOG) 0.025 % ointment Apply 1 application topically 2 (two) times daily. Use as needed.   No facility-administered encounter medications on file as of 02/06/2023.    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   L s/p lumpectomy, chemo and XRT   Cervicalgia    Cholelithiasis    DIVERTICULOSIS, COLON    ENDOMETRIOSIS    GERD (gastroesophageal reflux disease)    Heart palpitations    Hypertension    HYPOTHYROIDISM    Left wrist fracture 11/06/2016   10/2016 -  fell of high stool   Osteoporosis    Personal history of chemotherapy    Personal history of radiation therapy    Pleural plaque without asbestos    CT chest 10/2010: R>L lobes, upper> lower   Squamous cell carcinoma    History of BC   Past Surgical History:  Procedure Laterality Date   BREAST LUMPECTOMY  2001   left   CATARACT EXTRACTION Bilateral 09/2014   both eyes   CHOLECYSTECTOMY  2010   ENDOMETRIAL ABLATION     EXCISION / BIOPSY BREAST / NIPPLE / DUCT Left 2001   Family History  Problem Relation Age of Onset   Dementia Mother    Heart disease Father    Emphysema Father        smoked and worked with  asbestos   Colon cancer Neg Hx    Esophageal cancer Neg Hx    Rectal cancer Neg Hx    Migraines Neg Hx    Headache Neg Hx    Social History   Socioeconomic History   Marital status: Married    Spouse name: Not on file   Number of children: 2   Years of education: Not on file   Highest education level: Not on file  Occupational History   Occupation: Retired    Comment: Advertising account executive  Tobacco Use   Smoking status: Never   Smokeless tobacco: Never  Vaping Use   Vaping Use: Never used  Substance and Sexual Activity   Alcohol use: No    Comment: 1 drink every 2 months   Drug use: No   Sexual activity: Not Currently    Birth control/protection: Post-menopausal  Other Topics Concern   Not on file  Social History Narrative   Married, lives in [place] area since 2008 from [place] to be close to dtr   Social Determinants of Health   Financial Resource Strain: Low Risk  (02/06/2023)   Overall Financial Resource Strain (CARDIA)    Difficulty of Paying Living Expenses: Not hard at all  Food Insecurity: No Food Insecurity (02/06/2023)   Hunger Vital Sign    Worried About Running Out of Food in the Last Year: Never true    Ran Out of Food in the Last Year: Never true  Transportation Needs: No Transportation Needs (02/06/2023)   PRAPARE - Administrator, Civil Service (Medical): No    Lack of Transportation (Non-Medical): No  Physical Activity: Sufficiently Active (02/06/2023)   Exercise Vital Sign    Days of Exercise per Week: 5 days    Minutes of Exercise per Session: 30 min  Stress: No Stress Concern Present (02/06/2023)   [employer] of Occupational Health - Occupational Stress Questionnaire    Feeling of Stress : Not at all  Social Connections: Socially Integrated (02/06/2023)   Social Connection and Isolation Panel [NHANES]    Frequency of Communication with Friends and Family: More than three times a week    Frequency of Social Gatherings with Friends and  Family: More than three times a week    Attends Religious Services: More than 4 times per year    Active Member of [employer] or Organizations: Yes    Attends Engineer, structural: More than 4 times per year    Marital Status: Married    Tobacco Counseling Counseling given: Not Answered   Clinical Intake:  Pre-visit preparation completed: Yes  Pain : No/denies pain Pain Score: 4      BMI - recorded:  24.11 Nutritional Risks: None Diabetes: No  How often do you need to have someone help you when you read instructions, pamphlets, or other written materials from your doctor or pharmacy?: 1 - Never What is the last grade level you completed in school?: HSG/COLLEGE  Interpreter Needed?: No  Information entered by :: Elhadji Pecore N. Tria Noguera, LPN.   Activities of Daily Living    02/06/2023   11:09 AM  In your present state of health, do you have any difficulty performing the following activities:  Hearing? 1  Vision? 0  Difficulty concentrating or making decisions? 0  Walking or climbing stairs? 0  Dressing or bathing? 0  Doing errands, shopping? 0  Preparing Food and eating ? N  Using the Toilet? N  In the past six months, have you accidently leaked urine? Y  Do you have problems with loss of bowel control? N  Managing your Medications? N  Managing your Finances? N  Housekeeping or managing your Housekeeping? N    Patient Care Team: Pincus Sanes, MD as PCP - General (Internal Medicine) Jodelle Red, MD as PCP - Cardiology (Cardiology) Maxie Better, MD as Consulting Physician (Obstetrics and Gynecology) Avel Peace, MD as Consulting Physician (General Surgery) Nyoka Cowden, MD as Consulting Physician (Pulmonary Disease) Ladene Artist, MD as Consulting Physician (Internal Medicine) Teryl Lucy, MD (Orthopedic Surgery) Nelson Chimes, MD (Ophthalmology) Jodelle Red, MD as Consulting Physician (Cardiology)  Indicate any  recent Medical Services you may have received from other than [facility] providers in the past year (date may be approximate).     Assessment:   This is a routine wellness examination for Aleska.  Hearing/Vision screen Hearing Screening - Comments:: Patient wears hearing aids. Vision Screening - Comments:: Wears rx glasses - up to date with routine eye exams with [facility]   Dietary issues and exercise activities discussed:     Goals Addressed             This Visit's Progress    My goal for 2024 is to get rid of my left hip pain and walk better.        Depression Screen    02/06/2023   11:08 AM 01/01/2023    3:58 PM 10/30/2022    9:52 AM 08/21/2022    8:18 AM 03/25/2022   10:18 AM 01/22/2022    9:40 AM 10/01/2021    1:28 PM  PHQ 2/9 Scores  PHQ - 2 Score 4 4 0 0 0 0 0  PHQ- 9 Score 6 6 0 2 2      Fall Risk    02/06/2023   10:17 AM 01/01/2023    3:39 PM 10/30/2022    9:52 AM 08/21/2022    8:18 AM 03/25/2022   10:18 AM  Fall Risk   Falls in the past year? 0 0 0 0 0  Number falls in past yr: 0 0 0 0 0  Injury with Fall? 0 0 0 0 0  Risk for fall due to : No Fall Risks No Fall Risks No Fall Risks No Fall Risks No Fall Risks  Follow up Falls prevention discussed Falls evaluation completed Falls evaluation completed Falls evaluation completed Falls evaluation completed    MEDICARE RISK AT HOME:  Medicare Risk at Home - 02/06/23 1111     Any stairs in or around the home? Yes    If so, are there any without handrails? No    Home free of loose throw rugs in walkways,  pet beds, electrical cords, etc? Yes    Adequate lighting in your home to reduce risk of falls? Yes    Life alert? No    Use of a cane, walker or w/c? No    Grab bars in the bathroom? Yes    Shower chair or bench in shower? Yes    Elevated toilet seat or a handicapped toilet? Yes             TIMED UP AND GO:  Was the test performed?  Yes  Length of time to ambulate 10 feet: 8 sec Gait steady and  fast without use of assistive device    Cognitive Function:        02/06/2023   11:10 AM 01/22/2022    9:48 AM  6CIT Screen  What Year? 0 points 0 points  What month? 0 points 0 points  What time? 0 points 0 points  Count back from 20 0 points 0 points  Months in reverse 0 points 0 points  Repeat phrase 0 points 0 points  Total Score 0 points 0 points    Immunizations Immunization History  Administered Date(s) Administered    Astrazeneca Covid-19 Vaccine, Pf, 0.5 Ml Non [place]  09/29/2019   Fluad Quad(high Dose 65+) 06/09/2019, 06/20/2022   Influenza Split 05/04/2010, 05/20/2011, 06/10/2012   Influenza Whole 06/18/2007   Influenza, High Dose Seasonal PF 06/07/2013, 04/20/2015, 07/25/2016, 06/19/2017, 07/10/2018, 06/22/2020   Influenza,inj,Quad PF,6+ Mos 05/31/2014   Influenza-Unspecified 06/08/2021   PFIZER(Purple Top)SARS-COV-2 Vaccination 09/08/2019, 09/29/2019, 05/12/2020   Pneumococcal Conjugate-13 07/28/2014   Pneumococcal Polysaccharide-23 06/18/2007, 08/19/2014   Td 06/18/2007   Td (Adult), 2 Lf Tetanus Toxid, Preservative Free 05/20/2007   Tdap 07/06/2017   Unspecified SARS-COV-2 Vaccination 09/29/2019   Zoster Recombinat (Shingrix) 03/24/2020, 09/22/2020   Zoster, Live 11/12/2011, 08/19/2014    TDAP status: Up to date  Flu Vaccine status: Up to date  Pneumococcal vaccine status: Up to date  Covid-19 vaccine status: Completed vaccines  Qualifies for Shingles Vaccine? Yes   Zostavax completed Yes   Shingrix Completed?: Yes  Screening Tests Health Maintenance  Topic Date Due   INFLUENZA VACCINE  03/20/2023   Medicare Annual Wellness (AWV)  02/06/2024   DEXA SCAN  03/15/2024   DTaP/Tdap/Td (3 - Td or Tdap) 07/07/2027   Pneumonia Vaccine 58+ Years old  Completed   Zoster Vaccines- Shingrix  Completed   HPV VACCINES  Aged Out   COVID-19 Vaccine  Discontinued   Hepatitis C Screening  Discontinued    Health Maintenance  There are no preventive care  reminders to display for this patient.   Colorectal cancer screening: No longer required.   Mammogram status: Completed 02/12/2022. Repeat every year  Bone Density status: Completed 03/15/2022. Results reflect: Bone density results: OSTEOPOROSIS. Repeat every 2 years.  Lung Cancer Screening: (Low Dose CT Chest recommended if Age 26-80 years, 20 pack-year currently smoking OR have quit w/in 15years.) does not qualify.   Lung Cancer Screening Referral: NO  Additional Screening:  Hepatitis C Screening: does qualify; Completed 09/25/2020  Vision Screening: Recommended annual ophthalmology exams for early detection of glaucoma and other disorders of the eye. Is the patient up to date with their annual eye exam?  Yes  Who is the provider or what is the name of the office in which the patient attends annual eye exams? [facility] If pt is not established with a provider, would they like to be referred to a provider to establish care? No .  Dental Screening: Recommended annual dental exams for proper oral hygiene  Diabetic Foot Exam: N/A  Community Resource Referral / Chronic Care Management: CRR required this visit?  Yes   CCM required this visit?  No     Plan:     I have personally reviewed and noted the following in the patient's chart:   Medical and social history Use of alcohol, tobacco or illicit drugs  Current medications and supplements including opioid prescriptions. Patient is not currently taking opioid prescriptions. Functional ability and status Nutritional status Physical activity Advanced directives List of other physicians Hospitalizations, surgeries, and ER visits in previous 12 months Vitals Screenings to include cognitive, depression, and falls Referrals and appointments  In addition, I have reviewed and discussed with patient certain preventive protocols, quality metrics, and best practice recommendations. A written personalized care plan for  preventive services as well as general preventive health recommendations were provided to patient.     Mickeal Needy, LPN   09/06/1476   After Visit Summary: Printed  Nurse Notes: Normal cognitive status assessed by direct observation by this Nurse Health Advisor. No abnormalities found.

## 2023-02-06 NOTE — Progress Notes (Signed)
   Rubin Payor, PhD, LAT, ATC acting as a scribe for [name], MD.  Emily [name] is a 82 y.o. female who presents to [facility] today for exacerbation of her L hip pain. Pt was last seen by Dr. Denyse Amass on 10/31/22 and was referred to PT, completing 4 visits. She had a L GT steroid injection on 08/27/22.  Today, pt reports PT has been great. She had a near trip and fall a few weeks ago and felt like she "pulled" something in her posterior L hip. She notes an ESI last month by Dr. Ethelene Hal. She locates pain to the lateral aspect of her L hip. She is wanting to try a repeat injection and re-referral to PT  Dx imaging: 04/02/22 L-spine XR   Pertinent review of systems: No fevers or chills  Relevant historical information: CKD   Exam:  BP 112/74   Pulse 66   Ht 5\' 1"  (1.549 m)   Wt 126 lb (57.2 kg)   SpO2 95%   BMI 23.81 kg/m  General: Well Developed, well nourished, and in no acute distress.   MSK: Left hip: Normal-appearing Tender palpation greater and tender to palpation near the origin of the gluteus medius near the sacrum. Normal hip motion. Decreased strength to hip abduction and external rotation.    Lab and Radiology Results  Hip greater trochanteric injection: Left Consent obtained and timeout performed. Area of maximum tenderness palpated and identified. Skin cleaned with alcohol, cold spray applied. A 22-gauge needle was used to access the greater trochanteric bursa. 40mg  of Kenalog and 2 mL of Marcaine were used to inject the trochanteric bursa. Patient tolerated the procedure well.     Assessment and Plan: 82 y.o. female with left lateral hip pain thought to be due to trochanteric bursitis.  Plan for steroid injection and physical therapy referral.  If not improving on recheck will obtain x-ray and potentially MRI.   PDMP not reviewed this encounter. Orders Placed This Encounter  Procedures   Ambulatory referral to  Physical Therapy    Referral Priority:   Routine    Referral Type:   Physical Medicine    Referral Reason:   Specialty Services Required    Requested Specialty:   Physical Therapy    Number of Visits Requested:   1   No orders of the defined types were placed in this encounter.    Discussed warning signs or symptoms. Please see discharge instructions. Patient expresses understanding.   The above documentation has been reviewed and is accurate and complete [name], M.D.

## 2023-02-07 ENCOUNTER — Ambulatory Visit: Payer: Medicare HMO | Admitting: Family Medicine

## 2023-02-07 VITALS — BP 112/74 | HR 66 | Ht 61.0 in | Wt 126.0 lb

## 2023-02-07 DIAGNOSIS — M25562 Pain in left knee: Secondary | ICD-10-CM | POA: Diagnosis not present

## 2023-02-07 DIAGNOSIS — G8929 Other chronic pain: Secondary | ICD-10-CM | POA: Diagnosis not present

## 2023-02-07 NOTE — Patient Instructions (Signed)
Thank you for coming in today.   You received an injection today. Seek immediate medical attention if the joint becomes red, extremely painful, or is oozing fluid.   I've referred you to Physical Therapy.  Let us know if you don't hear from them in one week.  

## 2023-02-10 ENCOUNTER — Other Ambulatory Visit: Payer: Self-pay | Admitting: Internal Medicine

## 2023-02-18 ENCOUNTER — Ambulatory Visit
Admission: RE | Admit: 2023-02-18 | Discharge: 2023-02-18 | Disposition: A | Discharge status: Home / Self Care | Payer: Medicare HMO | Source: Ambulatory Visit | Attending: Internal Medicine | Admitting: Internal Medicine

## 2023-02-18 DIAGNOSIS — Z1231 Encounter for screening mammogram for malignant neoplasm of breast: Secondary | ICD-10-CM | POA: Diagnosis not present

## 2023-02-24 ENCOUNTER — Ambulatory Visit: Payer: Medicare HMO | Admitting: Physical Therapy

## 2023-02-24 DIAGNOSIS — M25552 Pain in left hip: Secondary | ICD-10-CM | POA: Diagnosis not present

## 2023-02-24 NOTE — Therapy (Signed)
OUTPATIENT PHYSICAL THERAPY  EVALUATION   Patient Name: Roxana Lai MRN: [id number] DOB:03-26-1941, 82 y.o., female Today's Date: 02/24/2023  END OF SESSION:  PT End of Session - 03/02/23 1647     Visit Number 1    Number of Visits 16    Date for PT Re-Evaluation 04/27/23    Authorization Type [employer]  ( 16 previous visits in 2024)    PT Start Time 0933    PT Stop Time 1014    PT Time Calculation (min) 41 min    Activity Tolerance Patient tolerated treatment well    Behavior During Therapy WFL for tasks assessed/performed              Past Medical History:  Diagnosis Date   Actinic keratosis    Allergic rhinitis    BACK PAIN    CARCINOMA, BREAST, 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   L s/p lumpectomy, chemo and XRT   Cervicalgia    Cholelithiasis    DIVERTICULOSIS, COLON    ENDOMETRIOSIS    GERD (gastroesophageal reflux disease)    Heart palpitations    Hypertension    HYPOTHYROIDISM    Left wrist fracture 11/06/2016   10/2016 - fell of high stool   Osteoporosis    Personal history of chemotherapy    Personal history of radiation therapy    Pleural plaque without asbestos    CT chest 10/2010: R>L lobes, upper> lower   Squamous cell carcinoma    History of BC   Past Surgical History:  Procedure Laterality Date   BREAST LUMPECTOMY  2001   left   CATARACT EXTRACTION Bilateral 09/2014   both eyes   CHOLECYSTECTOMY  2010   ENDOMETRIAL ABLATION     EXCISION / BIOPSY BREAST / NIPPLE / DUCT Left 2001   Patient Active Problem List   Diagnosis Date Noted   Change in bowel habits 01/15/2023   Anxiety 01/01/2023   Agatston coronary artery calcium score 22.4  (12/2021) 10/30/2022   Urge incontinence of urine 10/30/2022   Word finding difficulty 10/30/2022   Chronic pain of left knee 08/21/2022   Lumbar radiculopathy 03/25/2022   Cough 12/27/2021   Sleep difficulties 09/27/2021   Cervicogenic headache 04/10/2021   Elevated blood pressure reading  04/10/2021   Skin abnormality 02/27/2021   Aortic atherosclerosis (HCC) 09/22/2020   Urinary urgency 03/23/2020   CKD (chronic kidney disease) stage 3, GFR 30-59 ml/min (HCC) 03/22/2020   Prediabetes 03/22/2020   Change in stool 11/17/2019   Right-sided chest wall pain 11/17/2019   Urinary retention 09/23/2019   SOB (shortness of breath) 01/15/2019   Upper airway cough syndrome 01/15/2019   Belching 12/14/2018   Reactive airway disease 12/05/2018   Tear of left rotator cuff 10/05/2018   Pre-syncope 05/19/2018   DDD (degenerative disc disease), cervical 01/15/2018   Neuralgia 09/12/2017   Presbycusis of both ears 12/11/2016   Memory difficulties 11/06/2016   IBS (irritable bowel syndrome) 04/16/2016   Hyperlipidemia 09/10/2015   Osteoporosis 09/08/2015   Palpitations    Pleural plaque without asbestos 12/03/2010   Chronic neck pain 12/18/2009   DIVERTICULOSIS, COLON 03/02/2009   Lumbago 03/11/2008   Actinic keratosis 01/23/2008   CARCINOMA, BREAST, ESTROGEN RECEPTOR NEGATIVE 10/12/2007   Hypothyroidism 06/18/2007   Allergic rhinitis 06/11/2007    PCP: Cheryll Cockayne  REFERRING PROVIDER: [name]   REFERRING DIAG:   Rationale for Evaluation and Treatment: Rehabilitation  THERAPY DIAG:  Pain in left hip  ONSET DATE:  SUBJECTIVE:                                                                                                                                                                                           SUBJECTIVE STATEMENT: Eval: 02/24/23:  Lurched forward with L leg recently, which has aggravated leg pain.  June 25th had injection in gr troch region. States only pain relief for a few days. States pain in L posterior hip, glute, back side of thigh, into L lateral knee. She wore knee brace that helped some.  Stairs and walking creates the worst pain. Has seen back dr, has been told pain is coming from her hip.   Previous:   Pt states pain in L leg since  August.  Had epidural in spine L5, no help. In September. Also had nerve block. Helped back pain, but still has leg pain.  Also has seen Ortho, now with IT band pain.  Now seeing sports met. She also had coristone shot in hip 1/9.  Having to use cane at times for pain, was using walker when it was really bad.  Can't put pressure on leg. Feels better at night, sleeping through night. Feels back to baseline today after hip injection.  Did not have much PT for this yet.  Most pain with standing/walking and with initial standing.      PERTINENT HISTORY:   PAIN:  Are you having pain? Yes: NPRS scale: 5/10 Pain location: L glute, Leg Pain description: sore, very painful Aggravating factors: walking, stairs.  Relieving factors: none stated  PRECAUTIONS: None  WEIGHT BEARING RESTRICTIONS: No  FALLS:  Has patient fallen in last 6 months? No   OCCUPATION:   PLOF: Independent  PATIENT GOALS: decreased pain in leg.    OBJECTIVE:   DIAGNOSTIC FINDINGS:   PATIENT SURVEYS:   COGNITION: Overall cognitive status: Within functional limits for tasks assessed     SENSATION: WFL   POSTURE:   PALPATION: Tenderness in L glute, piriformis, and distal ITB,    LUMBAR ROM:   AROM eval  Flexion wfl  Extension Mild limitation  Right lateral flexion Wfl  Left lateral flexion Wfl,  Right rotation wfl  Left rotation    (Blank rows = not tested)  LOWER EXTREMITY MMT   MMT Right eval Left eval  Hip flexion 4+ 4-  Hip extension    Hip abduction 4 4-  Hip adduction    Hip internal rotation    Hip external rotation    Knee flexion  4+  Knee extension  4+  Ankle dorsiflexion    Ankle plantarflexion    Ankle inversion  Ankle eversion     (Blank rows = not tested)   LUMBAR SPECIAL TESTS:    TODAY'S TREATMENT:                                                                                                                              DATE:  02/24/23:  Ther ex: see  below for HEP  PATIENT EDUCATION:  Education details: PT POC, Exam findings, HEP updated and reviewed  Person educated: Patient Education method: Explanation, Demonstration, Tactile cues, Verbal cues, and Handouts Education comprehension: verbalized understanding, returned demonstration, verbal cues required, tactile cues required, and needs further education  HOME EXERCISE PROGRAM:  Access Code: [email or web address]  updated and reviewed.  URL: [email or web address] Date: 03/02/2023 Prepared by: Sedalia Muta  Exercises - Cat Cow  - 2 x daily - 1 sets - 10 reps - 5 hold - Supine Lower Trunk Rotation  - 2 x daily - 10 reps - 5 hold - Supine Posterior Pelvic Tilt  - 2 x daily - 1 sets - 10 reps - Supine Piriformis Stretch Pulling Heel to Hip  - 2 x daily - 3 reps - 30 hold - Supine Single Knee to Chest  - 2 x daily - 3 reps - 30 hold - Sidelying Hip Abduction  - 1 x daily - 1-2 sets - 5-10 reps  ASSESSMENT:  CLINICAL IMPRESSION: Patient presents with primary complaint of increased pain in L leg and hip. She has had ongoing deficits and pain.  She has soreness in gr trochanter, glute, and at distal ITB. She has weakness in hip and has had ongoing/intermittent pain for almost 1 year . She has decreased ability for sustaining standing activity, walking, and stairs, due to pain . Pt to benefit from skilled PT to improve deficits and pain. Pt has had previous PT in the past with good outcome and reduced pain. She will require continued care at this time, Kx modifier will be applied starting after visit 18/19.   OBJECTIVE IMPAIRMENTS: Abnormal gait, decreased activity tolerance, decreased knowledge of use of DME, decreased mobility, difficulty walking, decreased ROM, decreased strength, increased muscle spasms, improper body mechanics, and pain.   ACTIVITY LIMITATIONS: carrying, lifting, bending, standing, squatting, stairs, transfers, and locomotion level  PARTICIPATION LIMITATIONS:  cleaning, laundry, shopping, and community activity  PERSONAL FACTORS: Time since onset of injury/illness/exacerbation are also affecting patient's functional outcome.   REHAB POTENTIAL: Good  CLINICAL DECISION MAKING: Stable/uncomplicated  EVALUATION COMPLEXITY: Low   GOALS: Goals reviewed with patient? Yes  SHORT TERM GOALS: Target date: 03/10/2023   Pt to be independent with initial HEP  Goal status: INITIAL   LONG TERM GOALS: Target date: 9/2 /24  Pt to be independent with final HEP   Goal status: INITIAL  2.  Pt to report decreased pain in hip and LE to 0-3/10 with standing activity and stairs.   Goal status: INITIAL  3.  Pt to demo increased strength of L  hip and LE to at least 4+/5 to improve ability for stairs and gait.   Goal status: INITIAL  4.  Pt to report ability for standing activity for at least 30 min with pain 0-3/10.    Goal status: INITIAL    PLAN:  PT FREQUENCY: 1-2x/week  PT DURATION: 8 weeks  PLANNED INTERVENTIONS: Therapeutic exercises, Therapeutic activity, Neuromuscular re-education, Balance training, Gait training, Patient/Family education, Self Care, Joint mobilization, Joint manipulation, Stair training, DME instructions, Aquatic Therapy, Dry Needling, Electrical stimulation, Spinal manipulation, Spinal mobilization, Cryotherapy, Moist heat, Taping, Traction, Ultrasound, Ionotophoresis 4mg /ml Dexamethasone, and Manual therapy.  PLAN FOR NEXT SESSION:    Sedalia Muta, PT, DPT 4:50 PM  03/02/23

## 2023-03-02 ENCOUNTER — Encounter: Payer: Self-pay | Admitting: Physical Therapy

## 2023-03-04 ENCOUNTER — Encounter: Payer: Self-pay | Admitting: Physical Therapy

## 2023-03-04 ENCOUNTER — Ambulatory Visit: Payer: Medicare HMO | Admitting: Physical Therapy

## 2023-03-04 DIAGNOSIS — M25552 Pain in left hip: Secondary | ICD-10-CM

## 2023-03-04 DIAGNOSIS — M5459 Other low back pain: Secondary | ICD-10-CM | POA: Diagnosis not present

## 2023-03-04 DIAGNOSIS — M79605 Pain in left leg: Secondary | ICD-10-CM

## 2023-03-04 NOTE — Therapy (Signed)
OUTPATIENT PHYSICAL THERAPY  TREATMENT    Patient Name: Emily [name] MRN: [id number] DOB:14-Jan-1941, 82 y.o., female Today's Date: 03/04/2023  END OF SESSION:  PT End of Session - 03/04/23 0804     Visit Number 2    Number of Visits 16    Date for PT Re-Evaluation 04/21/23    Authorization Type [employer]  ( 16 previous visits in 2024)    PT Start Time 0805    PT Stop Time 0845    PT Time Calculation (min) 40 min    Activity Tolerance Patient tolerated treatment well    Behavior During Therapy WFL for tasks assessed/performed              Past Medical History:  Diagnosis Date   Actinic keratosis    Allergic rhinitis    BACK PAIN    CARCINOMA, BREAST, 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   L s/p lumpectomy, chemo and XRT   Cervicalgia    Cholelithiasis    DIVERTICULOSIS, COLON    ENDOMETRIOSIS    GERD (gastroesophageal reflux disease)    Heart palpitations    Hypertension    HYPOTHYROIDISM    Left wrist fracture 11/06/2016   10/2016 - fell of high stool   Osteoporosis    Personal history of chemotherapy    Personal history of radiation therapy    Pleural plaque without asbestos    CT chest 10/2010: R>L lobes, upper> lower   Squamous cell carcinoma    History of BC   Past Surgical History:  Procedure Laterality Date   BREAST LUMPECTOMY  2001   left   CATARACT EXTRACTION Bilateral 09/2014   both eyes   CHOLECYSTECTOMY  2010   ENDOMETRIAL ABLATION     EXCISION / BIOPSY BREAST / NIPPLE / DUCT Left 2001   Patient Active Problem List   Diagnosis Date Noted   Change in bowel habits 01/15/2023   Anxiety 01/01/2023   Agatston coronary artery calcium score 22.4  (12/2021) 10/30/2022   Urge incontinence of urine 10/30/2022   Word finding difficulty 10/30/2022   Chronic pain of left knee 08/21/2022   Lumbar radiculopathy 03/25/2022   Cough 12/27/2021   Sleep difficulties 09/27/2021   Cervicogenic headache 04/10/2021   Elevated blood pressure reading  04/10/2021   Skin abnormality 02/27/2021   Aortic atherosclerosis (HCC) 09/22/2020   Urinary urgency 03/23/2020   CKD (chronic kidney disease) stage 3, GFR 30-59 ml/min (HCC) 03/22/2020   Prediabetes 03/22/2020   Change in stool 11/17/2019   Right-sided chest wall pain 11/17/2019   Urinary retention 09/23/2019   SOB (shortness of breath) 01/15/2019   Upper airway cough syndrome 01/15/2019   Belching 12/14/2018   Reactive airway disease 12/05/2018   Tear of left rotator cuff 10/05/2018   Pre-syncope 05/19/2018   DDD (degenerative disc disease), cervical 01/15/2018   Neuralgia 09/12/2017   Presbycusis of both ears 12/11/2016   Memory difficulties 11/06/2016   IBS (irritable bowel syndrome) 04/16/2016   Hyperlipidemia 09/10/2015   Osteoporosis 09/08/2015   Palpitations    Pleural plaque without asbestos 12/03/2010   Chronic neck pain 12/18/2009   DIVERTICULOSIS, COLON 03/02/2009   Lumbago 03/11/2008   Actinic keratosis 01/23/2008   CARCINOMA, BREAST, ESTROGEN RECEPTOR NEGATIVE 10/12/2007   Hypothyroidism 06/18/2007   Allergic rhinitis 06/11/2007    PCP: Cheryll Cockayne  REFERRING PROVIDER: [name]   REFERRING DIAG:   Rationale for Evaluation and Treatment: Rehabilitation  THERAPY DIAG:  Pain in left hip  Other low back  pain  Pain in left leg  ONSET DATE:   SUBJECTIVE:                                                                                                                                                                                           SUBJECTIVE STATEMENT: 03/04/2023 Pt with no new complaints. Thinks her leg feels " a little better" has been doing well with HEP.   Eval: 02/24/23:  Lurched forward with L leg recently, which has aggravated leg pain.  June 25th had injection in gr troch region. States only pain relief for a few days. States pain in L posterior hip, glute, back side of thigh, into L lateral knee. She wore knee brace that helped some.   Stairs and walking creates the worst pain. Has seen back dr, has been told pain is coming from her hip.   Previous:   Pt states pain in L leg since August.  Had epidural in spine L5, no help. In September. Also had nerve block. Helped back pain, but still has leg pain.  Also has seen Ortho, now with IT band pain.  Now seeing sports met. She also had coristone shot in hip 1/9.  Having to use cane at times for pain, was using walker when it was really bad.  Can't put pressure on leg. Feels better at night, sleeping through night. Feels back to baseline today after hip injection.  Did not have much PT for this yet.  Most pain with standing/walking and with initial standing.      PERTINENT HISTORY:   PAIN:  Are you having pain? Yes: NPRS scale: 5/10 Pain location: L glute, Leg Pain description: sore, very painful Aggravating factors: walking, stairs.  Relieving factors: none stated  PRECAUTIONS: None  WEIGHT BEARING RESTRICTIONS: No  FALLS:  Has patient fallen in last 6 months? No   OCCUPATION:   PLOF: Independent  PATIENT GOALS: decreased pain in leg.    OBJECTIVE:   DIAGNOSTIC FINDINGS:   PATIENT SURVEYS:   COGNITION: Overall cognitive status: Within functional limits for tasks assessed     SENSATION: WFL   POSTURE:   PALPATION: Tenderness in L glute, piriformis, and distal ITB,    LUMBAR ROM:   AROM eval  Flexion wfl  Extension Mild limitation  Right lateral flexion Wfl  Left lateral flexion Wfl,  Right rotation wfl  Left rotation    (Blank rows = not tested)  LOWER EXTREMITY MMT   MMT Right eval Left eval  Hip flexion 4+ 4-  Hip extension    Hip abduction 4 4-  Hip adduction    Hip  internal rotation    Hip external rotation    Knee flexion  4+  Knee extension  4+  Ankle dorsiflexion    Ankle plantarflexion    Ankle inversion    Ankle eversion     (Blank rows = not tested)   LUMBAR SPECIAL TESTS:    TODAY'S TREATMENT:                                                                                                                               DATE:   03/04/23: Therapeutic Exercise: Aerobic: Supine:  Clams GTB x10, and x 10 alternating ( with TA) ;  S/L: hip abd 2 x 10 bil;  Seated: Standing: Stretches:  Review for cat/cow in quadruped and seated positions for HEP;  LTR x 10;  Neuromuscular Re-education: Manual Therapy:  DTM for L glute, and along sacral border.  Self Care: Trigger Point Dry-Needling  Treatment instructions: Expect mild to moderate muscle soreness. S/S of pneumothorax if dry needled over a lung field, and to seek immediate medical attention should they occur. Patient verbalized understanding of these instructions and education.  Patient Consent Given: Yes Education handout provided: Yes Muscles treated: L piriformis, glute med,  Electrical stimulation performed: No Parameters: N/A Treatment response/outcome: palpable increase in muscle length.    PATIENT EDUCATION:  Education details:  HEP updated and reviewed  Person educated: Patient Education method: Explanation, Demonstration, Tactile cues, Verbal cues, and Handouts Education comprehension: verbalized understanding, returned demonstration, verbal cues required, tactile cues required, and needs further education  HOME EXERCISE PROGRAM:  Access Code: [email or web address]  updated and reviewed.  URL: [email or web address] Date: 03/02/2023 Prepared by: Sedalia Muta  Exercises - Cat Cow  - 2 x daily - 1 sets - 10 reps - 5 hold - Supine Lower Trunk Rotation  - 2 x daily - 10 reps - 5 hold - Supine Posterior Pelvic Tilt  - 2 x daily - 1 sets - 10 reps - Supine Piriformis Stretch Pulling Heel to Hip  - 2 x daily - 3 reps - 30 hold - Supine Single Knee to Chest  - 2 x daily - 3 reps - 30 hold - Sidelying Hip Abduction  - 1 x daily - 1-2 sets - 5-10 reps  ASSESSMENT:  CLINICAL IMPRESSION: Pt with most tenderness in L glute,  addressed with manual and trial for dry needling today, pt with good tolerance. Will continue to benefit from lumbar and hip mobility, and focus for hip strength and muscle tension release. Reviewed cat cow and alternative(seated) position today, for some reported discomfort in neck with HEP.    Eval: Patient presents with primary complaint of increased pain in L leg and hip. She has had ongoing deficits and pain.  She has soreness in gr trochanter, glute, and at distal ITB. She has weakness in hip and has had ongoing/intermittent pain for almost 1 year . She has decreased ability for sustaining standing activity, walking,  and stairs, due to pain . Pt to benefit from skilled PT to improve deficits and pain. Pt has had previous PT in the past with good outcome and reduced pain. She will require continued care at this time, Kx modifier will be applied starting after visit 18/19.   OBJECTIVE IMPAIRMENTS: Abnormal gait, decreased activity tolerance, decreased knowledge of use of DME, decreased mobility, difficulty walking, decreased ROM, decreased strength, increased muscle spasms, improper body mechanics, and pain.   ACTIVITY LIMITATIONS: carrying, lifting, bending, standing, squatting, stairs, transfers, and locomotion level  PARTICIPATION LIMITATIONS: cleaning, laundry, shopping, and community activity  PERSONAL FACTORS: Time since onset of injury/illness/exacerbation are also affecting patient's functional outcome.   REHAB POTENTIAL: Good  CLINICAL DECISION MAKING: Stable/uncomplicated  EVALUATION COMPLEXITY: Low   GOALS: Goals reviewed with patient? Yes  SHORT TERM GOALS: Target date: 03/10/2023   Pt to be independent with initial HEP  Goal status: INITIAL   LONG TERM GOALS: Target date: 9/2 /24  Pt to be independent with final HEP   Goal status: INITIAL  2.  Pt to report decreased pain in hip and LE to 0-3/10 with standing activity and stairs.   Goal status: INITIAL  3.  Pt  to demo increased strength of L hip and LE to at least 4+/5 to improve ability for stairs and gait.   Goal status: INITIAL  4.  Pt to report ability for standing activity for at least 30 min with pain 0-3/10.    Goal status: INITIAL   PLAN:  PT FREQUENCY: 1-2x/week  PT DURATION: 8 weeks  PLANNED INTERVENTIONS: Therapeutic exercises, Therapeutic activity, Neuromuscular re-education, Balance training, Gait training, Patient/Family education, Self Care, Joint mobilization, Joint manipulation, Stair training, DME instructions, Aquatic Therapy, Dry Needling, Electrical stimulation, Spinal manipulation, Spinal mobilization, Cryotherapy, Moist heat, Taping, Traction, Ultrasound, Ionotophoresis 4mg /ml Dexamethasone, and Manual therapy.  PLAN FOR NEXT SESSION:    Sedalia Muta, PT, DPT 8:05 AM  03/04/23

## 2023-03-07 ENCOUNTER — Ambulatory Visit (INDEPENDENT_AMBULATORY_CARE_PROVIDER_SITE_OTHER): Payer: Medicare HMO | Admitting: Physical Therapy

## 2023-03-07 DIAGNOSIS — M5459 Other low back pain: Secondary | ICD-10-CM

## 2023-03-07 DIAGNOSIS — M25552 Pain in left hip: Secondary | ICD-10-CM

## 2023-03-07 DIAGNOSIS — M79605 Pain in left leg: Secondary | ICD-10-CM

## 2023-03-07 NOTE — Therapy (Unsigned)
OUTPATIENT PHYSICAL THERAPY  TREATMENT    Patient Name: Sarah Baez MRN: [id number] DOB:23-Nov-1940, 82 y.o., female Today's Date: 03/07/2023  END OF SESSION:  PT End of Session - 03/08/23 1641     Visit Number 3    Number of Visits 16    Date for PT Re-Evaluation 04/21/23    Authorization Type [employer]  ( 16 previous visits in 2024)    Authorization Time Period KX    PT Start Time 1234    PT Stop Time 1315    PT Time Calculation (min) 41 min    Activity Tolerance Patient tolerated treatment well    Behavior During Therapy WFL for tasks assessed/performed               Past Medical History:  Diagnosis Date   Actinic keratosis    Allergic rhinitis    BACK PAIN    CARCINOMA, BREAST, 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   L s/p lumpectomy, chemo and XRT   Cervicalgia    Cholelithiasis    DIVERTICULOSIS, COLON    ENDOMETRIOSIS    GERD (gastroesophageal reflux disease)    Heart palpitations    Hypertension    HYPOTHYROIDISM    Left wrist fracture 11/06/2016   10/2016 - fell of high stool   Osteoporosis    Personal history of chemotherapy    Personal history of radiation therapy    Pleural plaque without asbestos    CT chest 10/2010: R>L lobes, upper> lower   Squamous cell carcinoma    History of BC   Past Surgical History:  Procedure Laterality Date   BREAST LUMPECTOMY  2001   left   CATARACT EXTRACTION Bilateral 09/2014   both eyes   CHOLECYSTECTOMY  2010   ENDOMETRIAL ABLATION     EXCISION / BIOPSY BREAST / NIPPLE / DUCT Left 2001   Patient Active Problem List   Diagnosis Date Noted   Change in bowel habits 01/15/2023   Anxiety 01/01/2023   Agatston coronary artery calcium score 22.4  (12/2021) 10/30/2022   Urge incontinence of urine 10/30/2022   Word finding difficulty 10/30/2022   Chronic pain of left knee 08/21/2022   Lumbar radiculopathy 03/25/2022   Cough 12/27/2021   Sleep difficulties 09/27/2021   Cervicogenic headache 04/10/2021    Elevated blood pressure reading 04/10/2021   Skin abnormality 02/27/2021   Aortic atherosclerosis (HCC) 09/22/2020   Urinary urgency 03/23/2020   CKD (chronic kidney disease) stage 3, GFR 30-59 ml/min (HCC) 03/22/2020   Prediabetes 03/22/2020   Change in stool 11/17/2019   Right-sided chest wall pain 11/17/2019   Urinary retention 09/23/2019   SOB (shortness of breath) 01/15/2019   Upper airway cough syndrome 01/15/2019   Belching 12/14/2018   Reactive airway disease 12/05/2018   Tear of left rotator cuff 10/05/2018   Pre-syncope 05/19/2018   DDD (degenerative disc disease), cervical 01/15/2018   Neuralgia 09/12/2017   Presbycusis of both ears 12/11/2016   Memory difficulties 11/06/2016   IBS (irritable bowel syndrome) 04/16/2016   Hyperlipidemia 09/10/2015   Osteoporosis 09/08/2015   Palpitations    Pleural plaque without asbestos 12/03/2010   Chronic neck pain 12/18/2009   DIVERTICULOSIS, COLON 03/02/2009   Lumbago 03/11/2008   Actinic keratosis 01/23/2008   CARCINOMA, BREAST, ESTROGEN RECEPTOR NEGATIVE 10/12/2007   Hypothyroidism 06/18/2007   Allergic rhinitis 06/11/2007    PCP: Cheryll Cockayne  REFERRING PROVIDER: [name]   REFERRING DIAG:   Rationale for Evaluation and Treatment: Rehabilitation  THERAPY DIAG:  Pain in left hip  Other low back pain  Pain in left leg  ONSET DATE:   SUBJECTIVE:                                                                                                                                                                                           SUBJECTIVE STATEMENT: 03/07/2023  Pt with no new complaints. Thinks her leg feels " a little better" has been doing well with HEP. Outside of L knee has been sore.   Eval: 02/24/23:  Lurched forward with L leg recently, which has aggravated leg pain.  June 25th had injection in gr troch region. States only pain relief for a few days. States pain in L posterior hip, glute, back side of  thigh, into L lateral knee. She wore knee brace that helped some.  Stairs and walking creates the worst pain. Has seen back dr, has been told pain is coming from her hip.   Previous:   Pt states pain in L leg since August.  Had epidural in spine L5, no help. In September. Also had nerve block. Helped back pain, but still has leg pain.  Also has seen Ortho, now with IT band pain.  Now seeing sports met. She also had coristone shot in hip 1/9.  Having to use cane at times for pain, was using walker when it was really bad.  Can't put pressure on leg. Feels better at night, sleeping through night. Feels back to baseline today after hip injection.  Did not have much PT for this yet.  Most pain with standing/walking and with initial standing.      PERTINENT HISTORY:   PAIN:  Are you having pain? Yes: NPRS scale: 5/10 Pain location: L glute, Leg Pain description: sore, very painful Aggravating factors: walking, stairs.  Relieving factors: none stated  PRECAUTIONS: None  WEIGHT BEARING RESTRICTIONS: No  FALLS:  Has patient fallen in last 6 months? No   OCCUPATION:   PLOF: Independent  PATIENT GOALS: decreased pain in leg.    OBJECTIVE:   DIAGNOSTIC FINDINGS:   PATIENT SURVEYS:   COGNITION: Overall cognitive status: Within functional limits for tasks assessed     SENSATION: WFL   POSTURE:   PALPATION: Tenderness in L glute, piriformis, and distal ITB,    LUMBAR ROM:   AROM eval  Flexion wfl  Extension Mild limitation  Right lateral flexion Wfl  Left lateral flexion Wfl,  Right rotation wfl  Left rotation    (Blank rows = not tested)  LOWER EXTREMITY MMT   MMT Right eval Left eval  Hip flexion 4+ 4-  Hip extension    Hip abduction 4 4-  Hip adduction    Hip internal rotation    Hip external rotation    Knee flexion  4+  Knee extension  4+  Ankle dorsiflexion    Ankle plantarflexion    Ankle inversion    Ankle eversion     (Blank rows = not  tested)   LUMBAR SPECIAL TESTS:    TODAY'S TREATMENT:                                                                                                                              DATE:   03/07/23: Therapeutic Exercise: Aerobic: Supine:  Bridging  x 15;  Clams GTB x10, and x 10 alternating ( with TA) ;  S/L: hip abd 2 x 10 bil;  Seated: Standing: Stretches:   LTR x 10;  Neuromuscular Re-education: Manual Therapy:  STM /tennis ball for L glute, and along sacral border, Long leg distraction for L hip and back;  Self Care:   PATIENT EDUCATION:  Education details:  HEP reviewed  Person educated: Patient Education method: Explanation, Demonstration, Tactile cues, Verbal cues, and Handouts Education comprehension: verbalized understanding, returned demonstration, verbal cues required, tactile cues required, and needs further education  HOME EXERCISE PROGRAM:  Access Code: [email or web address]  updated and reviewed.  URL: [email or web address] Date: 03/02/2023 Prepared by: Sedalia Muta  Exercises - Cat Cow  - 2 x daily - 1 sets - 10 reps - 5 hold - Supine Lower Trunk Rotation  - 2 x daily - 10 reps - 5 hold - Supine Posterior Pelvic Tilt  - 2 x daily - 1 sets - 10 reps - Supine Piriformis Stretch Pulling Heel to Hip  - 2 x daily - 3 reps - 30 hold - Supine Single Knee to Chest  - 2 x daily - 3 reps - 30 hold - Sidelying Hip Abduction  - 1 x daily - 1-2 sets - 5-10 reps  ASSESSMENT:  CLINICAL IMPRESSION: Pt with good tolerance for activities today. She has minimal tenderness in gr troch, but continues to have pain and tenderness in glute and along sacral border. Pain in glute somewhat improved after last visit and DN. Plan to continue to work on sacral pain, and progress strengthening as able. Trial for SLS and step ups today, increase pain in R glute region.    Eval: Patient presents with primary complaint of increased pain in L leg and hip. She has had ongoing deficits  and pain.  She has soreness in gr trochanter, glute, and at distal ITB. She has weakness in hip and has had ongoing/intermittent pain for almost 1 year . She has decreased ability for sustaining standing activity, walking, and stairs, due to pain . Pt to benefit from skilled PT to improve deficits and pain. Pt has had previous PT in the past with good outcome and reduced pain. She will require continued care at this time, Kx  modifier will be applied starting after visit 18/19.   OBJECTIVE IMPAIRMENTS: Abnormal gait, decreased activity tolerance, decreased knowledge of use of DME, decreased mobility, difficulty walking, decreased ROM, decreased strength, increased muscle spasms, improper body mechanics, and pain.   ACTIVITY LIMITATIONS: carrying, lifting, bending, standing, squatting, stairs, transfers, and locomotion level  PARTICIPATION LIMITATIONS: cleaning, laundry, shopping, and community activity  PERSONAL FACTORS: Time since onset of injury/illness/exacerbation are also affecting patient's functional outcome.   REHAB POTENTIAL: Good  CLINICAL DECISION MAKING: Stable/uncomplicated  EVALUATION COMPLEXITY: Low   GOALS: Goals reviewed with patient? Yes  SHORT TERM GOALS: Target date: 03/10/2023   Pt to be independent with initial HEP  Goal status: INITIAL   LONG TERM GOALS: Target date: 9/2 /24  Pt to be independent with final HEP   Goal status: INITIAL  2.  Pt to report decreased pain in hip and LE to 0-3/10 with standing activity and stairs.   Goal status: INITIAL  3.  Pt to demo increased strength of L hip and LE to at least 4+/5 to improve ability for stairs and gait.   Goal status: INITIAL  4.  Pt to report ability for standing activity for at least 30 min with pain 0-3/10.    Goal status: INITIAL   PLAN:  PT FREQUENCY: 1-2x/week  PT DURATION: 8 weeks  PLANNED INTERVENTIONS: Therapeutic exercises, Therapeutic activity, Neuromuscular re-education, Balance  training, Gait training, Patient/Family education, Self Care, Joint mobilization, Joint manipulation, Stair training, DME instructions, Aquatic Therapy, Dry Needling, Electrical stimulation, Spinal manipulation, Spinal mobilization, Cryotherapy, Moist heat, Taping, Traction, Ultrasound, Ionotophoresis 4mg /ml Dexamethasone, and Manual therapy.  PLAN FOR NEXT SESSION:    Sedalia Muta, PT, DPT 4:42 PM  03/08/23

## 2023-03-08 ENCOUNTER — Encounter: Payer: Self-pay | Admitting: Physical Therapy

## 2023-03-11 ENCOUNTER — Ambulatory Visit: Payer: Medicare HMO | Admitting: Physical Therapy

## 2023-03-11 ENCOUNTER — Encounter: Payer: Self-pay | Admitting: Physical Therapy

## 2023-03-11 DIAGNOSIS — M5459 Other low back pain: Secondary | ICD-10-CM | POA: Diagnosis not present

## 2023-03-11 DIAGNOSIS — M25552 Pain in left hip: Secondary | ICD-10-CM

## 2023-03-11 NOTE — Therapy (Signed)
OUTPATIENT PHYSICAL THERAPY  TREATMENT    Patient Name: Emily [name] MRN: [id number] DOB:[birth year], 82 y.o., female Today's Date: 03/11/2023  END OF SESSION:  PT End of Session - 03/11/23 0933     Visit Number 4    Number of Visits 16    Date for PT Re-Evaluation 04/21/23    Authorization Type [employer]  ( 16 previous visits in 2024)    Authorization Time Period KX    PT Start Time [phone]    PT Stop Time 1015    PT Time Calculation (min) 42 min    Activity Tolerance Patient tolerated treatment well    Behavior During Therapy WFL for tasks assessed/performed               Past Medical History:  Diagnosis Date   Actinic keratosis    Allergic rhinitis    BACK PAIN    CARCINOMA, BREAST, 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   L s/p lumpectomy, chemo and XRT   Cervicalgia    Cholelithiasis    DIVERTICULOSIS, COLON    ENDOMETRIOSIS    GERD (gastroesophageal reflux disease)    Heart palpitations    Hypertension    HYPOTHYROIDISM    Left wrist fracture 11/06/2016   10/2016 - fell of high stool   Osteoporosis    Personal history of chemotherapy    Personal history of radiation therapy    Pleural plaque without asbestos    CT chest 10/2010: R>L lobes, upper> lower   Squamous cell carcinoma    History of BC   Past Surgical History:  Procedure Laterality Date   BREAST LUMPECTOMY  2001   left   CATARACT EXTRACTION Bilateral 09/2014   both eyes   CHOLECYSTECTOMY  2010   ENDOMETRIAL ABLATION     EXCISION / BIOPSY BREAST / NIPPLE / DUCT Left 2001   Patient Active Problem List   Diagnosis Date Noted   Change in bowel habits 01/15/2023   Anxiety 01/01/2023   Agatston coronary artery calcium score 22.4  (12/2021) 10/30/2022   Urge incontinence of urine 10/30/2022   Word finding difficulty 10/30/2022   Chronic pain of left knee 08/21/2022   Lumbar radiculopathy 03/25/2022   Cough 12/27/2021   Sleep difficulties 09/27/2021   Cervicogenic headache 04/10/2021    Elevated blood pressure reading 04/10/2021   Skin abnormality 02/27/2021   Aortic atherosclerosis (HCC) 09/22/2020   Urinary urgency 03/23/2020   CKD (chronic kidney disease) stage 3, GFR 30-59 ml/min (HCC) 03/22/2020   Prediabetes 03/22/2020   Change in stool 11/17/2019   Right-sided chest wall pain 11/17/2019   Urinary retention 09/23/2019   SOB (shortness of breath) 01/15/2019   Upper airway cough syndrome 01/15/2019   Belching 12/14/2018   Reactive airway disease 12/05/2018   Tear of left rotator cuff 10/05/2018   Pre-syncope 05/19/2018   DDD (degenerative disc disease), cervical 01/15/2018   Neuralgia 09/12/2017   Presbycusis of both ears 12/11/2016   Memory difficulties 11/06/2016   IBS (irritable bowel syndrome) 04/16/2016   Hyperlipidemia 09/10/2015   Osteoporosis 09/08/2015   Palpitations    Pleural plaque without asbestos 12/03/2010   Chronic neck pain 12/18/2009   DIVERTICULOSIS, COLON 03/02/2009   Lumbago 03/11/2008   Actinic keratosis 01/23/2008   CARCINOMA, BREAST, ESTROGEN RECEPTOR NEGATIVE 10/12/2007   Hypothyroidism 06/18/2007   Allergic rhinitis 06/11/2007    PCP: Cheryll Cockayne  REFERRING PROVIDER: [name]   REFERRING DIAG:   Rationale for Evaluation and Treatment: Rehabilitation  THERAPY DIAG:  Pain in left hip  Other low back pain  ONSET DATE:   SUBJECTIVE:                                                                                                                                                                                           SUBJECTIVE STATEMENT: 03/11/2023  Pt states continued pain, pretty constant   Eval: 02/24/23:  Lurched forward with L leg recently, which has aggravated leg pain.  June 25th had injection in gr troch region. States only pain relief for a few days. States pain in L posterior hip, glute, back side of thigh, into L lateral knee. She wore knee brace that helped some.  Stairs and walking creates the worst pain.  Has seen back dr, has been told pain is coming from her hip.   Previous:   Pt states pain in L leg since August.  Had epidural in spine L5, no help. In September. Also had nerve block. Helped back pain, but still has leg pain.  Also has seen Ortho, now with IT band pain.  Now seeing sports met. She also had coristone shot in hip 1/9.  Having to use cane at times for pain, was using walker when it was really bad.  Can't put pressure on leg. Feels better at night, sleeping through night. Feels back to baseline today after hip injection.  Did not have much PT for this yet.  Most pain with standing/walking and with initial standing.      PERTINENT HISTORY:   PAIN:  Are you having pain? Yes: NPRS scale: 5/10 Pain location: L glute, Leg Pain description: sore, very painful Aggravating factors: walking, stairs.  Relieving factors: none stated  PRECAUTIONS: None  WEIGHT BEARING RESTRICTIONS: No  FALLS:  Has patient fallen in last 6 months? No   OCCUPATION:   PLOF: Independent  PATIENT GOALS: decreased pain in leg.    OBJECTIVE:   DIAGNOSTIC FINDINGS:   PATIENT SURVEYS:   COGNITION: Overall cognitive status: Within functional limits for tasks assessed     SENSATION: WFL   POSTURE:   PALPATION: Tenderness in L glute, piriformis, and distal ITB,    LUMBAR ROM:   AROM eval  Flexion wfl  Extension Mild limitation  Right lateral flexion Wfl  Left lateral flexion Wfl,  Right rotation wfl  Left rotation    (Blank rows = not tested)  LOWER EXTREMITY MMT   MMT Right eval Left eval  Hip flexion 4+ 4-  Hip extension    Hip abduction 4 4-  Hip adduction    Hip internal rotation    Hip external rotation  Knee flexion  4+  Knee extension  4+  Ankle dorsiflexion    Ankle plantarflexion    Ankle inversion    Ankle eversion     (Blank rows = not tested)   LUMBAR SPECIAL TESTS:    TODAY'S TREATMENT:                                                                                                                               DATE:   03/11/23: Therapeutic Exercise: Aerobic: Supine:  hip IR ROM x 15;   TA with ball squeeze x 15;  Bridging  x 15;   Clams GTB x10, and x 10 alternating ( with TA) ;  S/L: hip abd 2 x 10 bil;  Seated: Standing: Stretches:   Neuromuscular Re-education: Manual Therapy:  DTM /TPR to  L glute, and along sacral border, and L low lumbar, Long leg distraction for L hip and back;  Self Care:   PATIENT EDUCATION:  Education details:  HEP reviewed  Person educated: Patient Education method: Explanation, Demonstration, Tactile cues, Verbal cues, and Handouts Education comprehension: verbalized understanding, returned demonstration, verbal cues required, tactile cues required, and needs further education  HOME EXERCISE PROGRAM:  Access Code: [email or web address]  updated and reviewed.  URL: [email or web address] Date: 03/02/2023 Prepared by: Sedalia Muta  Exercises - Cat Cow  - 2 x daily - 1 sets - 10 reps - 5 hold - Supine Lower Trunk Rotation  - 2 x daily - 10 reps - 5 hold - Supine Posterior Pelvic Tilt  - 2 x daily - 1 sets - 10 reps - Supine Piriformis Stretch Pulling Heel to Hip  - 2 x daily - 3 reps - 30 hold - Supine Single Knee to Chest  - 2 x daily - 3 reps - 30 hold - Sidelying Hip Abduction  - 1 x daily - 1-2 sets - 5-10 reps  ASSESSMENT:  CLINICAL IMPRESSION:  Focus on manual for muscle tension release along sacral border today. Added more hip rotation/IR to HEP. Pt with decreased tension in glute after session today. May benefit from continued manual and dry needling for glute and lumbar spine for pain relief.    Eval: Patient presents with primary complaint of increased pain in L leg and hip. She has had ongoing deficits and pain.  She has soreness in gr trochanter, glute, and at distal ITB. She has weakness in hip and has had ongoing/intermittent pain for almost 1 year . She has decreased  ability for sustaining standing activity, walking, and stairs, due to pain . Pt to benefit from skilled PT to improve deficits and pain. Pt has had previous PT in the past with good outcome and reduced pain. She will require continued care at this time, Kx modifier will be applied starting after visit 18/19.   OBJECTIVE IMPAIRMENTS: Abnormal gait, decreased activity tolerance, decreased knowledge of use of DME, decreased mobility, difficulty walking, decreased ROM, decreased strength, increased  muscle spasms, improper body mechanics, and pain.   ACTIVITY LIMITATIONS: carrying, lifting, bending, standing, squatting, stairs, transfers, and locomotion level  PARTICIPATION LIMITATIONS: cleaning, laundry, shopping, and community activity  PERSONAL FACTORS: Time since onset of injury/illness/exacerbation are also affecting patient's functional outcome.   REHAB POTENTIAL: Good  CLINICAL DECISION MAKING: Stable/uncomplicated  EVALUATION COMPLEXITY: Low   GOALS: Goals reviewed with patient? Yes  SHORT TERM GOALS: Target date: 03/10/2023   Pt to be independent with initial HEP  Goal status: INITIAL   LONG TERM GOALS: Target date: 9/2 /24  Pt to be independent with final HEP   Goal status: INITIAL  2.  Pt to report decreased pain in hip and LE to 0-3/10 with standing activity and stairs.   Goal status: INITIAL  3.  Pt to demo increased strength of L hip and LE to at least 4+/5 to improve ability for stairs and gait.   Goal status: INITIAL  4.  Pt to report ability for standing activity for at least 30 min with pain 0-3/10.    Goal status: INITIAL   PLAN:  PT FREQUENCY: 1-2x/week  PT DURATION: 8 weeks  PLANNED INTERVENTIONS: Therapeutic exercises, Therapeutic activity, Neuromuscular re-education, Balance training, Gait training, Patient/Family education, Self Care, Joint mobilization, Joint manipulation, Stair training, DME instructions, Aquatic Therapy, Dry Needling,  Electrical stimulation, Spinal manipulation, Spinal mobilization, Cryotherapy, Moist heat, Taping, Traction, Ultrasound, Ionotophoresis 4mg /ml Dexamethasone, and Manual therapy.  PLAN FOR NEXT SESSION:    Sedalia Muta, PT, DPT 12:03 PM  03/11/23

## 2023-03-13 ENCOUNTER — Ambulatory Visit: Payer: Medicare HMO | Admitting: Physical Therapy

## 2023-03-13 ENCOUNTER — Encounter: Payer: Self-pay | Admitting: Physical Therapy

## 2023-03-13 DIAGNOSIS — M25552 Pain in left hip: Secondary | ICD-10-CM

## 2023-03-13 DIAGNOSIS — M5459 Other low back pain: Secondary | ICD-10-CM

## 2023-03-13 NOTE — Therapy (Signed)
OUTPATIENT PHYSICAL THERAPY  TREATMENT    Patient Name: Emily [name] MRN: [id number] DOB:07/21/1941, 82 y.o., female Today's Date: 03/13/2023  END OF SESSION:  PT End of Session - 03/13/23 0939     Visit Number 5    Number of Visits 16    Date for PT Re-Evaluation 04/21/23    Authorization Type [employer]  ( 16 previous visits in 2024)    Authorization Time Period KX    PT Start Time 0935    PT Stop Time 1013    PT Time Calculation (min) 38 min    Activity Tolerance Patient tolerated treatment well    Behavior During Therapy WFL for tasks assessed/performed               Past Medical History:  Diagnosis Date   Actinic keratosis    Allergic rhinitis    BACK PAIN    CARCINOMA, BREAST, 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   L s/p lumpectomy, chemo and XRT   Cervicalgia    Cholelithiasis    DIVERTICULOSIS, COLON    ENDOMETRIOSIS    GERD (gastroesophageal reflux disease)    Heart palpitations    Hypertension    HYPOTHYROIDISM    Left wrist fracture 11/06/2016   10/2016 - fell of high stool   Osteoporosis    Personal history of chemotherapy    Personal history of radiation therapy    Pleural plaque without asbestos    CT chest 10/2010: R>L lobes, upper> lower   Squamous cell carcinoma    History of BC   Past Surgical History:  Procedure Laterality Date   BREAST LUMPECTOMY  2001   left   CATARACT EXTRACTION Bilateral 09/2014   both eyes   CHOLECYSTECTOMY  2010   ENDOMETRIAL ABLATION     EXCISION / BIOPSY BREAST / NIPPLE / DUCT Left 2001   Patient Active Problem List   Diagnosis Date Noted   Change in bowel habits 01/15/2023   Anxiety 01/01/2023   Agatston coronary artery calcium score 22.4  (12/2021) 10/30/2022   Urge incontinence of urine 10/30/2022   Word finding difficulty 10/30/2022   Chronic pain of left knee 08/21/2022   Lumbar radiculopathy 03/25/2022   Cough 12/27/2021   Sleep difficulties 09/27/2021   Cervicogenic headache 04/10/2021    Elevated blood pressure reading 04/10/2021   Skin abnormality 02/27/2021   Aortic atherosclerosis (HCC) 09/22/2020   Urinary urgency 03/23/2020   CKD (chronic kidney disease) stage 3, GFR 30-59 ml/min (HCC) 03/22/2020   Prediabetes 03/22/2020   Change in stool 11/17/2019   Right-sided chest wall pain 11/17/2019   Urinary retention 09/23/2019   SOB (shortness of breath) 01/15/2019   Upper airway cough syndrome 01/15/2019   Belching 12/14/2018   Reactive airway disease 12/05/2018   Tear of left rotator cuff 10/05/2018   Pre-syncope 05/19/2018   DDD (degenerative disc disease), cervical 01/15/2018   Neuralgia 09/12/2017   Presbycusis of both ears 12/11/2016   Memory difficulties 11/06/2016   IBS (irritable bowel syndrome) 04/16/2016   Hyperlipidemia 09/10/2015   Osteoporosis 09/08/2015   Palpitations    Pleural plaque without asbestos 12/03/2010   Chronic neck pain 12/18/2009   DIVERTICULOSIS, COLON 03/02/2009   Lumbago 03/11/2008   Actinic keratosis 01/23/2008   CARCINOMA, BREAST, ESTROGEN RECEPTOR NEGATIVE 10/12/2007   Hypothyroidism 06/18/2007   Allergic rhinitis 06/11/2007    PCP: Cheryll Cockayne  REFERRING PROVIDER: [name]   REFERRING DIAG:   Rationale for Evaluation and Treatment: Rehabilitation  THERAPY DIAG:  Pain in left hip  Other low back pain  ONSET DATE:   SUBJECTIVE:                                                                                                                                                                                           SUBJECTIVE STATEMENT: 03/13/2023  Pt states continued pain in L SIJ  region.   Eval: 02/24/23:  Lurched forward with L leg recently, which has aggravated leg pain.  June 25th had injection in gr troch region. States only pain relief for a few days. States pain in L posterior hip, glute, back side of thigh, into L lateral knee. She wore knee brace that helped some.  Stairs and walking creates the worst pain.  Has seen back dr, has been told pain is coming from her hip.   Previous:   Pt states pain in L leg since August.  Had epidural in spine L5, no help. In September. Also had nerve block. Helped back pain, but still has leg pain.  Also has seen Ortho, now with IT band pain.  Now seeing sports met. She also had coristone shot in hip 1/9.  Having to use cane at times for pain, was using walker when it was really bad.  Can't put pressure on leg. Feels better at night, sleeping through night. Feels back to baseline today after hip injection.  Did not have much PT for this yet.  Most pain with standing/walking and with initial standing.      PERTINENT HISTORY:   PAIN:  Are you having pain? Yes: NPRS scale: 5/10 Pain location: L glute, Leg Pain description: sore, very painful Aggravating factors: walking, stairs.  Relieving factors: none stated  PRECAUTIONS: None  WEIGHT BEARING RESTRICTIONS: No  FALLS:  Has patient fallen in last 6 months? No   OCCUPATION:   PLOF: Independent  PATIENT GOALS: decreased pain in leg.    OBJECTIVE:   DIAGNOSTIC FINDINGS:   PATIENT SURVEYS:   COGNITION: Overall cognitive status: Within functional limits for tasks assessed     SENSATION: WFL   POSTURE:   PALPATION: Tenderness in L glute, piriformis, and distal ITB,    LUMBAR ROM:   AROM eval  Flexion wfl  Extension Mild limitation  Right lateral flexion Wfl  Left lateral flexion Wfl,  Right rotation wfl  Left rotation    (Blank rows = not tested)  LOWER EXTREMITY MMT   MMT Right eval Left eval  Hip flexion 4+ 4-  Hip extension    Hip abduction 4 4-  Hip adduction    Hip internal rotation    Hip  external rotation    Knee flexion  4+  Knee extension  4+  Ankle dorsiflexion    Ankle plantarflexion    Ankle inversion    Ankle eversion     (Blank rows = not tested)   LUMBAR SPECIAL TESTS:    TODAY'S TREATMENT:                                                                                                                               DATE:   03/13/23: Therapeutic Exercise: Aerobic: Supine:  hip IR ROM x 15;    Prone: hip ext x 10 bil;  Bridging  2 x 10;    S/L: hip abd 2 x 10 bil;  Seated:  sit to stand with education on back mechanics.  Standing:  Hip abd and ext 2 x 10 bil;  Stretches:   Neuromuscular Re-education: Manual Therapy:  DTM /TPR to  L glute, and along sacral border, Long leg distraction for L hip and back;  Self Care: Trigger Point Dry-Needling  Treatment instructions: Expect mild to moderate muscle soreness. S/S of pneumothorax if dry needled over a lung field, and to seek immediate medical attention should they occur. Patient verbalized understanding of these instructions and education.  Patient Consent Given: Yes Education handout provided: Previously provided Muscles treated: R glute min, piriformis, L lumbar paraspinals L3/4  Electrical stimulation performed: No Parameters: N/A Treatment response/outcome: Glutes/ back: palpable increase in muscle length.     PATIENT EDUCATION:  Education details:  HEP reviewed  Person educated: Patient Education method: Explanation, Demonstration, Tactile cues, Verbal cues, and Handouts Education comprehension: verbalized understanding, returned demonstration, verbal cues required, tactile cues required, and needs further education  HOME EXERCISE PROGRAM:  Access Code: [email or web address]  updated and reviewed.  URL: [email or web address] Date: 03/02/2023 Prepared by: Sedalia Muta  Exercises - Cat Cow  - 2 x daily - 1 sets - 10 reps - 5 hold - Supine Lower Trunk Rotation  - 2 x daily - 10 reps - 5 hold - Supine Posterior Pelvic Tilt  - 2 x daily - 1 sets - 10 reps - Supine Piriformis Stretch Pulling Heel to Hip  - 2 x daily - 3 reps - 30 hold - Supine Single Knee to Chest  - 2 x daily - 3 reps - 30 hold - Sidelying Hip Abduction  - 1 x daily - 1-2 sets - 5-10  reps  ASSESSMENT:  CLINICAL IMPRESSION:  Continued Focus on manual and dry needling for muscle tension release in glute and along sacral border today.  Pt with decreased tension and pain in glute after session today. Most pain is at L SIJ region. Hip/gr troch pain seems to be improving . Noted weakness in L glute vs R today, with focused strengthening for this today.   Eval: Patient presents with primary complaint of increased pain in L leg and hip. She has had ongoing deficits and pain.  She has  soreness in gr trochanter, glute, and at distal ITB. She has weakness in hip and has had ongoing/intermittent pain for almost 1 year . She has decreased ability for sustaining standing activity, walking, and stairs, due to pain . Pt to benefit from skilled PT to improve deficits and pain. Pt has had previous PT in the past with good outcome and reduced pain. She will require continued care at this time, Kx modifier will be applied starting after visit 18/19.   OBJECTIVE IMPAIRMENTS: Abnormal gait, decreased activity tolerance, decreased knowledge of use of DME, decreased mobility, difficulty walking, decreased ROM, decreased strength, increased muscle spasms, improper body mechanics, and pain.   ACTIVITY LIMITATIONS: carrying, lifting, bending, standing, squatting, stairs, transfers, and locomotion level  PARTICIPATION LIMITATIONS: cleaning, laundry, shopping, and community activity  PERSONAL FACTORS: Time since onset of injury/illness/exacerbation are also affecting patient's functional outcome.   REHAB POTENTIAL: Good  CLINICAL DECISION MAKING: Stable/uncomplicated  EVALUATION COMPLEXITY: Low   GOALS: Goals reviewed with patient? Yes  SHORT TERM GOALS: Target date: 03/10/2023   Pt to be independent with initial HEP  Goal status: INITIAL   LONG TERM GOALS: Target date: 9/2 /24  Pt to be independent with final HEP   Goal status: INITIAL  2.  Pt to report decreased pain in hip and LE  to 0-3/10 with standing activity and stairs.   Goal status: INITIAL  3.  Pt to demo increased strength of L hip and LE to at least 4+/5 to improve ability for stairs and gait.   Goal status: INITIAL  4.  Pt to report ability for standing activity for at least 30 min with pain 0-3/10.    Goal status: INITIAL   PLAN:  PT FREQUENCY: 1-2x/week  PT DURATION: 8 weeks  PLANNED INTERVENTIONS: Therapeutic exercises, Therapeutic activity, Neuromuscular re-education, Balance training, Gait training, Patient/Family education, Self Care, Joint mobilization, Joint manipulation, Stair training, DME instructions, Aquatic Therapy, Dry Needling, Electrical stimulation, Spinal manipulation, Spinal mobilization, Cryotherapy, Moist heat, Taping, Traction, Ultrasound, Ionotophoresis 4mg /ml Dexamethasone, and Manual therapy.  PLAN FOR NEXT SESSION:    Sedalia Muta, PT, DPT 4:59 PM  03/13/23

## 2023-03-18 ENCOUNTER — Encounter: Payer: Self-pay | Admitting: Physical Therapy

## 2023-03-18 ENCOUNTER — Ambulatory Visit: Payer: Medicare HMO | Admitting: Physical Therapy

## 2023-03-18 DIAGNOSIS — M25552 Pain in left hip: Secondary | ICD-10-CM | POA: Diagnosis not present

## 2023-03-18 DIAGNOSIS — M5459 Other low back pain: Secondary | ICD-10-CM | POA: Diagnosis not present

## 2023-03-18 NOTE — Therapy (Addendum)
OUTPATIENT PHYSICAL THERAPY  TREATMENT    Patient Name: Emily [name] MRN: [id number] DOB:[birth year], 82 y.o., female Today's Date: 03/18/2023  END OF SESSION:  PT End of Session - 03/18/23 0953     Visit Number 6    Number of Visits 16    Date for PT Re-Evaluation 04/21/23    Authorization Type [employer]  ( 16 previous visits in 2024)    Authorization Time Period KX    PT Start Time [phone]    PT Stop Time 1015    PT Time Calculation (min) 41 min    Activity Tolerance Patient tolerated treatment well    Behavior During Therapy WFL for tasks assessed/performed                Past Medical History:  Diagnosis Date   Actinic keratosis    Allergic rhinitis    BACK PAIN    CARCINOMA, BREAST, 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   L s/p lumpectomy, chemo and XRT   Cervicalgia    Cholelithiasis    DIVERTICULOSIS, COLON    ENDOMETRIOSIS    GERD (gastroesophageal reflux disease)    Heart palpitations    Hypertension    HYPOTHYROIDISM    Left wrist fracture 11/06/2016   10/2016 - fell of high stool   Osteoporosis    Personal history of chemotherapy    Personal history of radiation therapy    Pleural plaque without asbestos    CT chest 10/2010: R>L lobes, upper> lower   Squamous cell carcinoma    History of BC   Past Surgical History:  Procedure Laterality Date   BREAST LUMPECTOMY  2001   left   CATARACT EXTRACTION Bilateral 09/2014   both eyes   CHOLECYSTECTOMY  2010   ENDOMETRIAL ABLATION     EXCISION / BIOPSY BREAST / NIPPLE / DUCT Left 2001   Patient Active Problem List   Diagnosis Date Noted   Change in bowel habits 01/15/2023   Anxiety 01/01/2023   Agatston coronary artery calcium score 22.4  (12/2021) 10/30/2022   Urge incontinence of urine 10/30/2022   Word finding difficulty 10/30/2022   Chronic pain of left knee 08/21/2022   Lumbar radiculopathy 03/25/2022   Cough 12/27/2021   Sleep difficulties 09/27/2021   Cervicogenic headache 04/10/2021    Elevated blood pressure reading 04/10/2021   Skin abnormality 02/27/2021   Aortic atherosclerosis (HCC) 09/22/2020   Urinary urgency 03/23/2020   CKD (chronic kidney disease) stage 3, GFR 30-59 ml/min (HCC) 03/22/2020   Prediabetes 03/22/2020   Change in stool 11/17/2019   Right-sided chest wall pain 11/17/2019   Urinary retention 09/23/2019   SOB (shortness of breath) 01/15/2019   Upper airway cough syndrome 01/15/2019   Belching 12/14/2018   Reactive airway disease 12/05/2018   Tear of left rotator cuff 10/05/2018   Pre-syncope 05/19/2018   DDD (degenerative disc disease), cervical 01/15/2018   Neuralgia 09/12/2017   Presbycusis of both ears 12/11/2016   Memory difficulties 11/06/2016   IBS (irritable bowel syndrome) 04/16/2016   Hyperlipidemia 09/10/2015   Osteoporosis 09/08/2015   Palpitations    Pleural plaque without asbestos 12/03/2010   Chronic neck pain 12/18/2009   DIVERTICULOSIS, COLON 03/02/2009   Lumbago 03/11/2008   Actinic keratosis 01/23/2008   CARCINOMA, BREAST, ESTROGEN RECEPTOR NEGATIVE 10/12/2007   Hypothyroidism 06/18/2007   Allergic rhinitis 06/11/2007    PCP: Cheryll Cockayne  REFERRING PROVIDER: [name]   REFERRING DIAG:   Rationale for Evaluation and Treatment: Rehabilitation  THERAPY DIAG:  Pain in left hip  Other low back pain  ONSET DATE:   SUBJECTIVE:                                                                                                                                                                                           SUBJECTIVE STATEMENT: 03/18/2023  Pt states continued pain in most of L glute. Continues to have pain and "numb" feeling. Whole glute feels sore today     Eval: 02/24/23:  Lurched forward with L leg recently, which has aggravated leg pain.  June 25th had injection in gr troch region. States only pain relief for a few days. States pain in L posterior hip, glute, back side of thigh, into L lateral knee. She  wore knee brace that helped some.  Stairs and walking creates the worst pain. Has seen back dr, has been told pain is coming from her hip.   Previous:   Pt states pain in L leg since August.  Had epidural in spine L5, no help. In September. Also had nerve block. Helped back pain, but still has leg pain.  Also has seen Ortho, now with IT band pain.  Now seeing sports met. She also had coristone shot in hip 1/9.  Having to use cane at times for pain, was using walker when it was really bad.  Can't put pressure on leg. Feels better at night, sleeping through night. Feels back to baseline today after hip injection.  Did not have much PT for this yet.  Most pain with standing/walking and with initial standing.      PERTINENT HISTORY:   PAIN:  Are you having pain? Yes: NPRS scale: 5/10 Pain location: L glute, Leg Pain description: sore, very painful Aggravating factors: walking, stairs.  Relieving factors: none stated  PRECAUTIONS: None  WEIGHT BEARING RESTRICTIONS: No  FALLS:  Has patient fallen in last 6 months? No   OCCUPATION:   PLOF: Independent  PATIENT GOALS: decreased pain in leg.    OBJECTIVE:   DIAGNOSTIC FINDINGS:   PATIENT SURVEYS:   COGNITION: Overall cognitive status: Within functional limits for tasks assessed     SENSATION: WFL   POSTURE:   PALPATION: Tenderness in L glute, piriformis, and distal ITB,    LUMBAR ROM:   AROM eval  Flexion wfl  Extension Mild limitation  Right lateral flexion Wfl  Left lateral flexion Wfl,  Right rotation wfl  Left rotation    (Blank rows = not tested)  LOWER EXTREMITY MMT   MMT Right eval Left eval  Hip flexion 4+ 4-  Hip extension    Hip abduction 4  4-  Hip adduction    Hip internal rotation    Hip external rotation    Knee flexion  4+  Knee extension  4+  Ankle dorsiflexion    Ankle plantarflexion    Ankle inversion    Ankle eversion     (Blank rows = not tested)   LUMBAR SPECIAL TESTS:     TODAY'S TREATMENT:                                                                                                                              DATE:   03/18/23: Therapeutic Exercise: Aerobic: Supine:  hip IR ROM x 15;    Clams Gtb x 20;  Prone: hip ext x 10 bil (over 2 pillows)  Bridging  2 x 10;    S/L: hip abd 2 x 10 bil;  Seated:    Standing:  Stretches:   Neuromuscular Re-education: Manual Therapy:  DTM /TPR to  L glute, and along sacral border, Long leg distraction for L hip and back;  Self Care:    03/13/23: Therapeutic Exercise: Aerobic: Supine:  hip IR ROM x 15;    Prone: hip ext x 10 bil;  Bridging  2 x 10;    S/L: hip abd 2 x 10 bil;  Seated:  sit to stand with education on back mechanics.  Standing:  Hip abd and ext 2 x 10 bil;  Stretches:   Neuromuscular Re-education: Manual Therapy:  DTM /TPR to  L glute, and along sacral border, Long leg distraction for L hip and back;  Self Care: Trigger Point Dry-Needling  Treatment instructions: Expect mild to moderate muscle soreness. S/S of pneumothorax if dry needled over a lung field, and to seek immediate medical attention should they occur. Patient verbalized understanding of these instructions and education.  Patient Consent Given: Yes Education handout provided: Previously provided Muscles treated: R glute min, piriformis, L lumbar paraspinals L3/4  Electrical stimulation performed: No Parameters: N/A Treatment response/outcome: Glutes/ back: palpable increase in muscle length.     PATIENT EDUCATION:  Education details:  HEP reviewed  Person educated: Patient Education method: Explanation, Demonstration, Tactile cues, Verbal cues, and Handouts Education comprehension: verbalized understanding, returned demonstration, verbal cues required, tactile cues required, and needs further education  HOME EXERCISE PROGRAM:  Access Code: [email or web address]    ASSESSMENT:  CLINICAL IMPRESSION: Pt continues to have most  pain focused in glute, closer to SIJ and along sacral border. She has good ability for strengthening today, challenging but no pain with muscle activation. She continues to have difficulty with pain and has had minimal relief of pain overall, with standing, walking and stairs. We discussed f/u with Sports med in the next few weeks. Pt to benefit from continued care for strength for L hip/glute and pain.   Eval: Patient presents with primary complaint of increased pain in L leg and hip. She has had ongoing deficits and pain.  She has soreness in  gr trochanter, glute, and at distal ITB. She has weakness in hip and has had ongoing/intermittent pain for almost 1 year . She has decreased ability for sustaining standing activity, walking, and stairs, due to pain . Pt to benefit from skilled PT to improve deficits and pain. Pt has had previous PT in the past with good outcome and reduced pain. She will require continued care at this time, Kx modifier will be applied starting after visit 18/19.   OBJECTIVE IMPAIRMENTS: Abnormal gait, decreased activity tolerance, decreased knowledge of use of DME, decreased mobility, difficulty walking, decreased ROM, decreased strength, increased muscle spasms, improper body mechanics, and pain.   ACTIVITY LIMITATIONS: carrying, lifting, bending, standing, squatting, stairs, transfers, and locomotion level  PARTICIPATION LIMITATIONS: cleaning, laundry, shopping, and community activity  PERSONAL FACTORS: Time since onset of injury/illness/exacerbation are also affecting patient's functional outcome.   REHAB POTENTIAL: Good  CLINICAL DECISION MAKING: Stable/uncomplicated  EVALUATION COMPLEXITY: Low   GOALS: Goals reviewed with patient? Yes  SHORT TERM GOALS: Target date: 03/10/2023   Pt to be independent with initial HEP  Goal status: INITIAL   LONG TERM GOALS: Target date: 9/2 /24  Pt to be independent with final HEP   Goal status: INITIAL  2.  Pt to  report decreased pain in hip and LE to 0-3/10 with standing activity and stairs.   Goal status: INITIAL  3.  Pt to demo increased strength of L hip and LE to at least 4+/5 to improve ability for stairs and gait.   Goal status: INITIAL  4.  Pt to report ability for standing activity for at least 30 min with pain 0-3/10.    Goal status: INITIAL   PLAN:  PT FREQUENCY: 1-2x/week  PT DURATION: 8 weeks  PLANNED INTERVENTIONS: Therapeutic exercises, Therapeutic activity, Neuromuscular re-education, Balance training, Gait training, Patient/Family education, Self Care, Joint mobilization, Joint manipulation, Stair training, DME instructions, Aquatic Therapy, Dry Needling, Electrical stimulation, Spinal manipulation, Spinal mobilization, Cryotherapy, Moist heat, Taping, Traction, Ultrasound, Ionotophoresis 4mg /ml Dexamethasone, and Manual therapy.  PLAN FOR NEXT SESSION:    Sedalia Muta, PT, DPT 11:22 AM  03/18/23  PHYSICAL THERAPY DISCHARGE SUMMARY  Visits from Start of Care: 6  Plan: Patient agrees to discharge.  Patient goals were partially  met. Patient is being discharged due - pt returned to Dr due to continued pain, referred to surgeon for glute tear.   Sedalia Muta, PT, DPT 10:09 AM  05/20/23

## 2023-03-19 ENCOUNTER — Ambulatory Visit: Payer: Medicare HMO | Admitting: Family Medicine

## 2023-03-19 ENCOUNTER — Ambulatory Visit (INDEPENDENT_AMBULATORY_CARE_PROVIDER_SITE_OTHER): Payer: Medicare HMO

## 2023-03-19 ENCOUNTER — Encounter: Payer: Self-pay | Admitting: Family Medicine

## 2023-03-19 VITALS — BP 122/62 | HR 56 | Ht 61.0 in | Wt 123.0 lb

## 2023-03-19 DIAGNOSIS — M7062 Trochanteric bursitis, left hip: Secondary | ICD-10-CM

## 2023-03-19 DIAGNOSIS — M533 Sacrococcygeal disorders, not elsewhere classified: Secondary | ICD-10-CM

## 2023-03-19 DIAGNOSIS — M25552 Pain in left hip: Secondary | ICD-10-CM | POA: Diagnosis not present

## 2023-03-19 DIAGNOSIS — M5136 Other intervertebral disc degeneration, lumbar region: Secondary | ICD-10-CM | POA: Diagnosis not present

## 2023-03-19 NOTE — Progress Notes (Signed)
I, Stevenson Clinch, CMA acting as a scribe for Emily Graham, MD.  Emily [name] is a 82 y.o. female who presents to [facility] at [facility] today for exacerbation of her L hip pain. Pt was seen by Dr. Denyse Amass on 10/31/22 and was referred to PT, completing 4 visits. She had a L GT steroid injection on 08/27/22. Pt was last seen by Dr. Denyse Amass on 02/07/23, received steroid injection and referral was placed to PT. Considering MRI is sx do not improve.   Today patient reports minimal improvement s/p ESI. Wonders if sx are connected to her hip. Continues with PT and dry-needling with Lauren. No long-term relief with PT.  Pt brought a copy of MRI from 06/2022.   Pain is located in the left buttocks sacrum area.  Pain is worse with sitting.  She has not had improvement with physical therapy and injections.  Dx imaging: 04/02/22 L-spine XR    Pertinent review of systems: No fevers or chills  Relevant historical information: Osteoporosis   Exam:  BP 122/62   Pulse (!) 56   Ht 5\' 1"  (1.549 m)   Wt 123 lb (55.8 kg)   SpO2 98%   BMI 23.24 kg/m  General: Well Developed, well nourished, and in no acute distress.   MSK: L-spine nontender palpation midline.  Tender palpation left buttocks and sacrum area.  Normal lumbar motion and strength.    Lab and Radiology Results  X-ray images left hip obtained today personally and independently interpreted SI joint degenerative changes are present.  No acute fractures are visible.  Mild left hip DJD is present. Await formal radiology review          Assessment and Plan: 82 y.o. female with chronic left buttocks pain.  This is an ongoing issue occurring for almost a year now.  She has had trials of physical therapy, and greater trochanter bursa injection.  She recently completed a at least 6-week physical therapy course with not much benefit.  At this point plan for MRI pelvis to better evaluate the buttocks musculature and SI  joints and sacrum.  Differential includes SI joint dysfunction, muscle tear dysfunction or even sacral insufficiency fracture. Recheck following MRI.   PDMP not reviewed this encounter. Orders Placed This Encounter  Procedures   DG HIP UNILAT WITH PELVIS 2-3 VIEWS LEFT    Standing Status:   Future    Number of Occurrences:   1    Standing Expiration Date:   03/18/2024    Order Specific Question:   Reason for Exam (SYMPTOM  OR DIAGNOSIS REQUIRED)    Answer:   eval left hip pain    Order Specific Question:   Preferred imaging location?    Answer:   [facility]   MR PELVIS WO CONTRAST    Standing Status:   Future    Standing Expiration Date:   03/18/2024    Order Specific Question:   What is the patient's sedation requirement?    Answer:   No Sedation    Order Specific Question:   Does the patient have a pacemaker or implanted devices?    Answer:   No    Order Specific Question:   Preferred imaging location?    Answer:   [facility] (table limit-550lbs)   No orders of the defined types were placed in this encounter.    Discussed warning signs or symptoms. Please see discharge instructions. Patient expresses understanding.   The above  documentation has been reviewed and is accurate and complete Emily [name], M.D.

## 2023-03-19 NOTE — Patient Instructions (Signed)
Thank you for coming in today.   Please get an Xray today before you leave   You should hear from MRI scheduling within 1 week. If you do not hear please let me know.    Recheck after we get the MRI results back.   I will be out of the office the week of Aug 12.

## 2023-03-20 ENCOUNTER — Encounter: Payer: Medicare HMO | Admitting: Physical Therapy

## 2023-03-25 ENCOUNTER — Encounter: Payer: Medicare HMO | Admitting: Physical Therapy

## 2023-03-26 NOTE — Progress Notes (Signed)
Left hip x-ray shows no arthritis in the hip.  A little bit of arthritis changes are present in the lower portion of the lumbar spine that we can see on the x-ray of the hip

## 2023-03-27 ENCOUNTER — Encounter: Payer: Medicare HMO | Admitting: Physical Therapy

## 2023-04-01 ENCOUNTER — Encounter: Payer: Self-pay | Admitting: Family Medicine

## 2023-04-09 ENCOUNTER — Encounter: Payer: Self-pay | Admitting: Family Medicine

## 2023-04-09 ENCOUNTER — Telehealth: Payer: Self-pay | Admitting: Family Medicine

## 2023-04-09 ENCOUNTER — Other Ambulatory Visit: Payer: Self-pay | Admitting: Family Medicine

## 2023-04-09 NOTE — Telephone Encounter (Signed)
   The MRI is approved for [facility] [address].

## 2023-04-09 NOTE — Telephone Encounter (Signed)
Synethia from [facility] called ( x [phone]).  Pt is scheduled for MRI Pelvis 8/26 but per [employer] the authorization is for [facility], not [facility]. Berkley Harvey [id number], Case [id number])

## 2023-04-10 ENCOUNTER — Encounter: Payer: Self-pay | Admitting: Family Medicine

## 2023-04-11 ENCOUNTER — Emergency Department (HOSPITAL_BASED_OUTPATIENT_CLINIC_OR_DEPARTMENT_OTHER)
Admission: EM | Admit: 2023-04-11 | Discharge: 2023-04-11 | Disposition: A | Discharge status: Home / Self Care | Payer: Medicare HMO | Attending: Emergency Medicine | Admitting: Emergency Medicine

## 2023-04-11 ENCOUNTER — Other Ambulatory Visit (HOSPITAL_BASED_OUTPATIENT_CLINIC_OR_DEPARTMENT_OTHER): Payer: Self-pay

## 2023-04-11 ENCOUNTER — Other Ambulatory Visit: Payer: Self-pay

## 2023-04-11 ENCOUNTER — Encounter (HOSPITAL_BASED_OUTPATIENT_CLINIC_OR_DEPARTMENT_OTHER): Payer: Self-pay | Admitting: Emergency Medicine

## 2023-04-11 ENCOUNTER — Emergency Department (HOSPITAL_BASED_OUTPATIENT_CLINIC_OR_DEPARTMENT_OTHER): Payer: Medicare HMO

## 2023-04-11 DIAGNOSIS — R1031 Right lower quadrant pain: Secondary | ICD-10-CM | POA: Diagnosis not present

## 2023-04-11 DIAGNOSIS — N3001 Acute cystitis with hematuria: Secondary | ICD-10-CM

## 2023-04-11 DIAGNOSIS — K5730 Diverticulosis of large intestine without perforation or abscess without bleeding: Secondary | ICD-10-CM | POA: Diagnosis not present

## 2023-04-11 DIAGNOSIS — R319 Hematuria, unspecified: Secondary | ICD-10-CM | POA: Diagnosis not present

## 2023-04-11 HISTORY — DX: Disorder of kidney and ureter, unspecified: N28.9

## 2023-04-11 LAB — CBC WITH DIFFERENTIAL/PLATELET
Abs Immature Granulocytes: 0.04 10*3/uL (ref 0.00–0.07)
Basophils Absolute: 0 10*3/uL (ref 0.0–0.1)
Basophils Relative: 0 %
Eosinophils Absolute: 0.1 10*3/uL (ref 0.0–0.5)
Eosinophils Relative: 1 %
HCT: 47.7 % — ABNORMAL HIGH (ref 36.0–46.0)
Hemoglobin: 15.7 g/dL — ABNORMAL HIGH (ref 12.0–15.0)
Immature Granulocytes: 0 %
Lymphocytes Relative: 23 %
Lymphs Abs: 2.3 10*3/uL (ref 0.7–4.0)
MCH: 29.8 pg (ref 26.0–34.0)
MCHC: 32.9 g/dL (ref 30.0–36.0)
MCV: 90.5 fL (ref 80.0–100.0)
Monocytes Absolute: 0.9 10*3/uL (ref 0.1–1.0)
Monocytes Relative: 9 %
Neutro Abs: 6.4 10*3/uL (ref 1.7–7.7)
Neutrophils Relative %: 67 %
Platelets: 215 10*3/uL (ref 150–400)
RBC: 5.27 MIL/uL — ABNORMAL HIGH (ref 3.87–5.11)
RDW: 13.2 % (ref 11.5–15.5)
WBC: 9.7 10*3/uL (ref 4.0–10.5)
nRBC: 0 % (ref 0.0–0.2)

## 2023-04-11 LAB — BASIC METABOLIC PANEL
Anion gap: 7 (ref 5–15)
BUN: 19 mg/dL (ref 8–23)
CO2: 29 mmol/L (ref 22–32)
Calcium: 10.4 mg/dL — ABNORMAL HIGH (ref 8.9–10.3)
Chloride: 103 mmol/L (ref 98–111)
Creatinine, Ser: 0.96 mg/dL (ref 0.44–1.00)
GFR, Estimated: 60 mL/min — ABNORMAL LOW (ref >60)
Glucose, Bld: 90 mg/dL (ref 70–99)
Potassium: 4.4 mmol/L (ref 3.5–5.1)
Sodium: 139 mmol/L (ref 135–145)

## 2023-04-11 LAB — URINALYSIS, ROUTINE W REFLEX MICROSCOPIC
Bilirubin Urine: NEGATIVE
Glucose, UA: NEGATIVE mg/dL
Ketones, ur: NEGATIVE mg/dL
Nitrite: NEGATIVE
Protein, ur: 100 mg/dL — AB
RBC / HPF: >50 RBC/hpf (ref 0–5)
Specific Gravity, Urine: 1.011 (ref 1.005–1.030)
WBC, UA: >50 WBC/hpf (ref 0–5)
pH: 5 (ref 5.0–8.0)

## 2023-04-11 MED ORDER — CEPHALEXIN 500 MG PO CAPS: 500.0000 mg | ORAL_CAPSULE | Freq: Four times a day (QID) | ORAL | 0 refills | Status: AC

## 2023-04-11 NOTE — ED Triage Notes (Signed)
Pt endorses RLQ pain with urinary freq, urgency and decreased output yesterday. Hematuria today

## 2023-04-11 NOTE — ED Notes (Signed)
Pt given discharge instructions and reviewed prescriptions. Opportunities given for questions. Pt verbalizes understanding. PIV removed x1. Stone,Heather R, RN 

## 2023-04-11 NOTE — ED Provider Notes (Signed)
Patient is an 82 year old female presenting today with 2 to 3-day history of urgency and then today started noticing blood in her urine.  She also is having some flank pain but denies any vomiting or fevers.  UA today with large hemoglobin, leukocytes and greater than 50 white and red cells with few bacteria.  Concern for kidney stone versus pyelonephritis. I have independently visualized and interpreted pt's images today.  Renal scan today shows no evidence of stones or hydronephrosis.  Radiology reports no acute pathology.  I independently interpreted patient's labs and CBC and BMP without acute findings.  At this time patient is stable for discharge home.  She was given a course of antibiotics for UTI.    Gwyneth Sprout, MD 04/11/23 1640

## 2023-04-11 NOTE — ED Provider Notes (Signed)
[place] EMERGENCY DEPARTMENT AT [facility] Provider Note   CSN: [id number] Arrival date & time: 04/11/23  1048     History {Add pertinent medical, surgical, social history, OB history to HPI:1} Chief Complaint  Patient presents with   Hematuria    Emily [name] is a 82 y.o. female.  HPI    Patient comes in with chief complaint of right-sided abdominal pain, urinary urgency and frequency.  Today she started noting blood in the urine. Home Medications Prior to Admission medications   Medication Sig Start Date End Date Taking? Authorizing Provider  Acetaminophen (TYLENOL 8 HOUR ARTHRITIS PAIN PO) Take by mouth.    [provider]  atenolol (TENORMIN) 25 MG tablet TAKE 1 TABLET BY MOUTH EVERY DAY 02/10/23   Pincus Sanes, MD  Calcium-Vitamin D-Vitamin K 750-500-40 MG-UNT-MCG TABS Take 1 tablet by mouth 2 (two) times daily.    [provider]  chlorpheniramine (CHLOR-TRIMETON) 4 MG tablet Take 4 mg by mouth 2 (two) times daily as needed for allergies.    [provider]  cholecalciferol (VITAMIN D) 25 MCG (1000 UNIT) tablet Take 1,000 Units by mouth daily.    [provider]  [employer] ALLERGY RELIEF D 5-120 MG tablet TAKE 1 TABLET BY MOUTH TWICE A DAY 04/12/22   Burns, Bobette Mo, MD  EPINEPHrine 0.3 mg/0.3 mL IJ SOAJ injection Inject 0.3 mg into the muscle as needed for anaphylaxis. 05/21/21   Horton, Mayer Masker, MD  ipratropium (ATROVENT) 0.03 % nasal spray SPRAY 2 SPRAYS INTO EACH NOSTRIL EVERY 12 HOURS 12/19/22   Pincus Sanes, MD  levothyroxine (SYNTHROID) 75 MCG tablet TAKE 1 TABLET BY MOUTH EVERY DAY 11/18/22   Pincus Sanes, MD  triamcinolone (KENALOG) 0.025 % ointment Apply 1 application topically 2 (two) times daily. Use as needed. 03/29/21   Patton Salles, MD      Allergies    Covid-19 (mrna) vaccine, Molnupiravir, Allegra [fexofenadine], Levaquin [levofloxacin], Nexium [esomeprazole magnesium], and Valerian root     Review of Systems   Review of Systems  Physical Exam Updated Vital Signs BP (!) 170/100   Pulse 67   Temp (!) 97.5 F (36.4 C)   Resp 18   SpO2 95%  Physical Exam  ED Results / Procedures / Treatments   Labs (all labs ordered are listed, but only abnormal results are displayed) Labs Reviewed  URINALYSIS, ROUTINE W REFLEX MICROSCOPIC - Abnormal; Notable for the following components:      Result Value   Color, Urine ORANGE (*)    APPearance CLOUDY (*)    Hgb urine dipstick LARGE (*)    Protein, ur 100 (*)    Leukocytes,Ua LARGE (*)    Bacteria, UA FEW (*)    All other components within normal limits  URINE CULTURE  BASIC METABOLIC PANEL  CBC WITH DIFFERENTIAL/PLATELET    EKG None  Radiology No results found.  Procedures Procedures  {Document cardiac monitor, telemetry assessment procedure when appropriate:1}  Medications Ordered in ED Medications - No data to display  ED Course/ Medical Decision Making/ A&P   {   Click here for ABCD2, HEART and other calculatorsREFRESH Note before signing :1}                              Medical Decision Making Amount and/or Complexity of Data Reviewed Labs: ordered. Radiology: ordered.   ***  {Document critical care time when  appropriate:1} {Document review of labs and clinical decision tools ie heart score, Chads2Vasc2 etc:1}  {Document your independent review of radiology images, and any outside records:1} {Document your discussion with family members, caretakers, and with consultants:1} {Document social determinants of health affecting pt's care:1} {Document your decision making why or why not admission, treatments were needed:1} Final Clinical Impression(s) / ED Diagnoses Final diagnoses:  None    Rx / DC Orders ED Discharge Orders     None

## 2023-04-11 NOTE — Discharge Instructions (Signed)
Your kidney function and white blood cell counts today were normal.  CAT scan did not show any new findings.  You were given an antibiotic for your urinary tract infection which you can start taking today.  A culture of your urine was done and if for some reason what ever bacteria is causing your symptoms is not treated with the antibiotic they will call you in a new one.  If you start having fever, vomiting return to the emergency room.

## 2023-04-13 LAB — URINE CULTURE: Culture: >=100000 — AB

## 2023-04-14 ENCOUNTER — Telehealth (HOSPITAL_BASED_OUTPATIENT_CLINIC_OR_DEPARTMENT_OTHER): Payer: Self-pay | Admitting: *Deleted

## 2023-04-14 ENCOUNTER — Ambulatory Visit: Admission: RE | Admit: 2023-04-14 | Payer: Medicare HMO | Source: Ambulatory Visit

## 2023-04-14 DIAGNOSIS — M160 Bilateral primary osteoarthritis of hip: Secondary | ICD-10-CM | POA: Diagnosis not present

## 2023-04-14 DIAGNOSIS — M7062 Trochanteric bursitis, left hip: Secondary | ICD-10-CM

## 2023-04-14 DIAGNOSIS — M533 Sacrococcygeal disorders, not elsewhere classified: Secondary | ICD-10-CM | POA: Diagnosis not present

## 2023-04-14 DIAGNOSIS — M7601 Gluteal tendinitis, right hip: Secondary | ICD-10-CM | POA: Diagnosis not present

## 2023-04-14 NOTE — Telephone Encounter (Signed)
Post ED Visit - Positive Culture Follow-up  Culture report reviewed by antimicrobial stewardship pharmacist: [facility] []  [address], Pharm.D. []  Celedonio Miyamoto, Pharm.D., BCPS AQ-ID []  Garvin Fila, Pharm.D., BCPS []  Georgina Pillion, Pharm.D., BCPS []  [place], [address].D., BCPS, AAHIVP []  Estella Husk, Pharm.D., BCPS, AAHIVP [x]  Lysle Pearl, PharmD, BCPS []  Phillips Climes, PharmD, BCPS []  Agapito Games, PharmD, BCPS []  Verlan Friends, PharmD []  Mervyn Gay, PharmD, BCPS []  Vinnie Level, PharmD  [facility] []  Len Childs, PharmD []  Greer Pickerel, PharmD []  Adalberto Cole, PharmD []  Perlie Gold, Rph []  Lonell Face) Jean Rosenthal, PharmD []  Earl Many, PharmD []  Junita Push, PharmD []  Dorna Leitz, PharmD []  Terrilee Files, PharmD []  Lynann Beaver, PharmD []  Keturah Barre, PharmD []  Loralee Pacas, PharmD []  Bernadene Person, PharmD   Positive urine culture Treated with Cephalexin, organism sensitive to the same and no further patient follow-up is required at this time.  Virl Axe [facility] 04/14/2023, 9:32 AM

## 2023-04-16 DIAGNOSIS — N3000 Acute cystitis without hematuria: Secondary | ICD-10-CM | POA: Insufficient documentation

## 2023-04-16 DIAGNOSIS — N2883 Nephroptosis: Secondary | ICD-10-CM | POA: Insufficient documentation

## 2023-04-16 NOTE — Patient Instructions (Incomplete)
      Blood work was ordered.   The lab is on the first floor.    Medications changes include :       A referral was ordered and someone will call you to schedule an appointment.     No follow-ups on file.  

## 2023-04-16 NOTE — Progress Notes (Unsigned)
Subjective:    Patient ID: Emily [name], female    DOB: 07/12/1941, 82 y.o.   MRN: [id number]     HPI Emily [name] is here for follow up from the ED  8/23 - went to ED with right sided abdominal pain, urinary urgency and frequency x 2 days.  She noted blood in her urine that day.  No h/o kidney stones.  On exam - mild right sided tenderness.  UA concerning for infection.  Ct renal scan done - no stones.  Started on Keflex 500 mg QID x 7 days.  Symptoms improved and have resolved.  Her symptoms were very quick onset.  She was having some right flank pain.    She has chronic left sided pain - seeing sports med - just had an MRI.   Medications and allergies reviewed with patient and updated if appropriate.  Current Outpatient Medications on File Prior to Visit  Medication Sig Dispense Refill   Acetaminophen (TYLENOL 8 HOUR ARTHRITIS PAIN PO) Take by mouth.     atenolol (TENORMIN) 25 MG tablet TAKE 1 TABLET BY MOUTH EVERY DAY 90 tablet 2   Calcium-Vitamin D-Vitamin K 750-500-40 MG-UNT-MCG TABS Take 1 tablet by mouth 2 (two) times daily.     cephALEXin (KEFLEX) 500 MG capsule Take 1 capsule (500 mg total) by mouth 4 (four) times daily for 7 days. 28 capsule 0   chlorpheniramine (CHLOR-TRIMETON) 4 MG tablet Take 4 mg by mouth 2 (two) times daily as needed for allergies.     cholecalciferol (VITAMIN D) 25 MCG (1000 UNIT) tablet Take 1,000 Units by mouth daily.     [employer] ALLERGY RELIEF D 5-120 MG tablet TAKE 1 TABLET BY MOUTH TWICE A DAY 48 tablet 14   EPINEPHrine 0.3 mg/0.3 mL IJ SOAJ injection Inject 0.3 mg into the muscle as needed for anaphylaxis. 1 each 0   ipratropium (ATROVENT) 0.03 % nasal spray SPRAY 2 SPRAYS INTO EACH NOSTRIL EVERY 12 HOURS 30 mL 2   levothyroxine (SYNTHROID) 75 MCG tablet TAKE 1 TABLET BY MOUTH EVERY DAY 90 tablet 3   triamcinolone (KENALOG) 0.025 % ointment Apply 1 application topically 2 (two) times daily. Use as needed. 30 g 1   No current  facility-administered medications on file prior to visit.     Review of Systems  Constitutional:  Negative for chills and fever.  Gastrointestinal:  Negative for abdominal pain and nausea.  Genitourinary:  Negative for dysuria, frequency, hematuria and urgency.  Musculoskeletal:  Positive for back pain.       Objective:   Vitals:   04/17/23 1023  BP: 134/76  Pulse: 60  Temp: 98.2 F (36.8 C)  SpO2: 96%   BP Readings from Last 3 Encounters:  04/17/23 134/76  04/11/23 (!) 170/100  03/19/23 122/62   Wt Readings from Last 3 Encounters:  04/17/23 124 lb 6.4 oz (56.4 kg)  03/19/23 123 lb (55.8 kg)  02/07/23 126 lb (57.2 kg)   Body mass index is 23.51 kg/m.    Physical Exam Constitutional:      General: She is not in acute distress.    Appearance: Normal appearance. She is not ill-appearing.  HENT:     Head: Normocephalic and atraumatic.  Skin:    General: Skin is warm and dry.  Neurological:     Mental Status: She is alert. Mental status is at baseline.  Psychiatric:        Mood and Affect: Mood normal.  Behavior: Behavior normal.        Thought Content: Thought content normal.        Judgment: Judgment normal.        Lab Results  Component Value Date   WBC 9.7 04/11/2023   HGB 15.7 (H) 04/11/2023   HCT 47.7 (H) 04/11/2023   PLT 215 04/11/2023   GLUCOSE 90 04/11/2023   CHOL 226 (H) 10/28/2022   TRIG 248.0 (H) 10/28/2022   HDL 58.30 10/28/2022   LDLDIRECT 146.0 10/28/2022   LDLCALC 110 (H) 09/27/2021   ALT 16 10/28/2022   AST 19 10/28/2022   NA 139 04/11/2023   K 4.4 04/11/2023   CL 103 04/11/2023   CREATININE 0.96 04/11/2023   BUN 19 04/11/2023   CO2 29 04/11/2023   TSH 1.26 10/28/2022   HGBA1C 6.2 10/28/2022   CT Renal Stone Study CLINICAL DATA:  Abdominal/flank pain, stone suspected  Patient reports right lower quadrant pain with urinary frequency and urgency. Hematuria today.  EXAM: CT ABDOMEN AND PELVIS WITHOUT  CONTRAST  TECHNIQUE: Multidetector CT imaging of the abdomen and pelvis was performed following the standard protocol without IV contrast.  RADIATION DOSE REDUCTION: This exam was performed according to the departmental dose-optimization program which includes automated exposure control, adjustment of the mA and/or kV according to patient size and/or use of iterative reconstruction technique.  COMPARISON:  Contrast enhanced CT 10/25/2011  FINDINGS: Lower chest: Chronic pectus excavatum. Nodular pleural thickening in both hemidiaphragms, associated calcifications on the left. This is unchanged from remote prior exam.  Hepatobiliary: No focal liver abnormality is seen. Status post cholecystectomy. No biliary dilatation.  Pancreas: No ductal dilatation or inflammation.  Spleen: Normal in size without focal abnormality.  Adrenals/Urinary Tract: No adrenal nodule. Ptotic left kidney. Progressive left renal atrophy and scarring from prior exam. No hydronephrosis or renal calculi. No perinephric inflammation. No ureteral stones. The urinary bladder is partially distended. No bladder stone or wall thickening.  Stomach/Bowel: The appendix is normal. No bowel obstruction or inflammation. Occasional colonic diverticula without diverticulitis. Small colonic stool burden. Unremarkable appearance of the stomach.  Vascular/Lymphatic: Aortic atherosclerosis. No aneurysm. No bulky abdominopelvic adenopathy.  Reproductive: Uterus and bilateral adnexa are unremarkable.  Other: No ascites.  No abdominal wall or inguinal hernia.  Musculoskeletal: There are no acute or suspicious osseous abnormalities. Mild for age degenerative change in the spine.  IMPRESSION: 1. No renal stones or obstructive uropathy. No acute abnormality in the abdomen/pelvis. 2. 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 3. Minimal colonic diverticulosis without  diverticulitis. 4. Chronic pleural thickening of the hemidiaphragms, typical of prior asbestos exposure. No change from remote 2013 exam.  Aortic Atherosclerosis (ICD10-I70.0).  Electronically Signed   By: Emily [name] M.D.   On: 04/11/2023 15:42    Assessment & Plan:    See Problem List for Assessment and Plan of chronic medical problems.

## 2023-04-17 ENCOUNTER — Encounter: Payer: Self-pay | Admitting: Internal Medicine

## 2023-04-17 ENCOUNTER — Ambulatory Visit (INDEPENDENT_AMBULATORY_CARE_PROVIDER_SITE_OTHER): Payer: Medicare HMO | Admitting: Internal Medicine

## 2023-04-17 VITALS — BP 134/76 | HR 60 | Temp 98.2°F | Ht 61.0 in | Wt 124.4 lb

## 2023-04-17 DIAGNOSIS — N3001 Acute cystitis with hematuria: Secondary | ICD-10-CM

## 2023-04-17 DIAGNOSIS — R002 Palpitations: Secondary | ICD-10-CM | POA: Diagnosis not present

## 2023-04-17 DIAGNOSIS — N2883 Nephroptosis: Secondary | ICD-10-CM

## 2023-04-17 NOTE — Assessment & Plan Note (Signed)
Chronic Controlled, Stable Continue atenolol 25 mg daily - likely helping to keep BP controlled

## 2023-04-17 NOTE — Assessment & Plan Note (Signed)
Acute Has a couple of days left of abx - will complete Symptoms resolved Will recheck ua, ucx next week to confirm infection has been treated She will call immediately if she has similar symptoms so we can evaluate for a uti

## 2023-04-17 NOTE — Assessment & Plan Note (Signed)
New Noted on Ct scan Discussed diagnosis with patient Will refer to urology for more info , ? Treatment if needed

## 2023-04-18 DIAGNOSIS — H43813 Vitreous degeneration, bilateral: Secondary | ICD-10-CM | POA: Diagnosis not present

## 2023-04-18 DIAGNOSIS — H524 Presbyopia: Secondary | ICD-10-CM | POA: Diagnosis not present

## 2023-04-18 DIAGNOSIS — H35371 Puckering of macula, right eye: Secondary | ICD-10-CM | POA: Diagnosis not present

## 2023-04-18 DIAGNOSIS — H40013 Open angle with borderline findings, low risk, bilateral: Secondary | ICD-10-CM | POA: Diagnosis not present

## 2023-04-18 DIAGNOSIS — H04123 Dry eye syndrome of bilateral lacrimal glands: Secondary | ICD-10-CM | POA: Diagnosis not present

## 2023-04-22 ENCOUNTER — Encounter: Payer: Self-pay | Admitting: Internal Medicine

## 2023-04-22 DIAGNOSIS — N2883 Nephroptosis: Secondary | ICD-10-CM

## 2023-04-22 DIAGNOSIS — N3001 Acute cystitis with hematuria: Secondary | ICD-10-CM | POA: Diagnosis not present

## 2023-04-22 LAB — URINALYSIS, ROUTINE W REFLEX MICROSCOPIC
Bilirubin Urine: NEGATIVE
Hgb urine dipstick: NEGATIVE
Ketones, ur: NEGATIVE
Leukocytes,Ua: NEGATIVE
Nitrite: NEGATIVE
RBC / HPF: NONE SEEN (ref 0–?)
Specific Gravity, Urine: 1.025 (ref 1.000–1.030)
Total Protein, Urine: NEGATIVE
Urine Glucose: NEGATIVE
Urobilinogen, UA: 0.2 (ref 0.0–1.0)
pH: 6 (ref 5.0–8.0)

## 2023-04-23 LAB — URINE CULTURE: Result:: NO GROWTH

## 2023-04-23 NOTE — Addendum Note (Signed)
Addended by: Pincus Sanes on: 04/23/2023 08:13 PM   Modules accepted: Orders

## 2023-04-23 NOTE — Telephone Encounter (Signed)
[facility] in [facility] will need a referral to them put in Epic before they can schedule patient -

## 2023-04-28 ENCOUNTER — Ambulatory Visit (INDEPENDENT_AMBULATORY_CARE_PROVIDER_SITE_OTHER): Payer: Medicare HMO | Admitting: Urology

## 2023-04-28 ENCOUNTER — Encounter: Payer: Self-pay | Admitting: Family Medicine

## 2023-04-28 ENCOUNTER — Encounter: Payer: Self-pay | Admitting: Urology

## 2023-04-28 VITALS — BP 156/90 | HR 59 | Ht 61.0 in | Wt 120.0 lb

## 2023-04-28 DIAGNOSIS — R399 Unspecified symptoms and signs involving the genitourinary system: Secondary | ICD-10-CM | POA: Diagnosis not present

## 2023-04-28 DIAGNOSIS — R93422 Abnormal radiologic findings on diagnostic imaging of left kidney: Secondary | ICD-10-CM

## 2023-04-28 DIAGNOSIS — Z87440 Personal history of urinary (tract) infections: Secondary | ICD-10-CM | POA: Diagnosis not present

## 2023-04-28 DIAGNOSIS — Z08 Encounter for follow-up examination after completed treatment for malignant neoplasm: Secondary | ICD-10-CM | POA: Diagnosis not present

## 2023-04-28 LAB — URINALYSIS, ROUTINE W REFLEX MICROSCOPIC
Bilirubin, UA: NEGATIVE
Glucose, UA: NEGATIVE
Ketones, UA: NEGATIVE
Nitrite, UA: NEGATIVE
Protein,UA: NEGATIVE
RBC, UA: NEGATIVE
Specific Gravity, UA: 1.025 (ref 1.005–1.030)
Urobilinogen, Ur: 0.2 mg/dL (ref 0.2–1.0)
pH, UA: 5 (ref 5.0–7.5)

## 2023-04-28 LAB — MICROSCOPIC EXAMINATION

## 2023-04-28 NOTE — Progress Notes (Signed)
Assessment: 1. History of UTI      Plan: Today I reviewed the patient's imaging and told her that I do not have any significant concerns with her malrotated left kidney.  Her follow-up urinalysis also shows no evidence of persistent hematuria. Follow-up as needed   Chief Complaint:?  Left kidney problem  History of Present Illness:  Emily [name] is a 82 y.o. female who is seen in consultation from Pincus Sanes, MD for evaluation of left renal abnl. Patient actually presented to the emergency department on 04/11/2023 with dysuria and frequency and urgency concerning for UTI and also some right sided abdominal discomfort.  At that time she underwent a CT stone study which showed no stones or evidence for obstruction.  No acute abnormalities in the abdomen or pelvis noted.  Comment was made that the patient had a ptotic left kidney with left renal atrophy.  On review of her imaging she in fact does not have a ptotic kidney--her kidney is in the normal retroperitoneal position but is malrotated laterally.  There is very mild atrophy which is likely just from normal aging.  She has normal renal function. Patient's culture grew staph and she was treated with appropriate antibiotics.  Her symptoms and hematuria have resolved.  On urinalysis today she has no significant microscopic hematuria.  She has had no prior issues with recurrent UTIs.    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   L s/p lumpectomy, chemo and XRT   Cervicalgia    Cholelithiasis    DIVERTICULOSIS, COLON    ENDOMETRIOSIS    GERD (gastroesophageal reflux disease)    Heart palpitations    Hypertension    HYPOTHYROIDISM    Left wrist fracture 11/06/2016   10/2016 - fell of high stool   Osteoporosis    Personal history of chemotherapy    Personal history of radiation therapy     Pleural plaque without asbestos    CT chest 10/2010: R>L lobes, upper> lower   Renal disorder    Squamous cell carcinoma    History of BC    Past Surgical History:  Past Surgical History:  Procedure Laterality Date   BREAST LUMPECTOMY  2001   left   CATARACT EXTRACTION Bilateral 09/2014   both eyes   CHOLECYSTECTOMY  2010   ENDOMETRIAL ABLATION     EXCISION / BIOPSY BREAST / NIPPLE / DUCT Left 2001    Allergies:  Allergies  Allergen Reactions   Covid-19 (Mrna) Vaccine Anaphylaxis   Molnupiravir Other (See Comments)    Tongue swelling   Allegra [Fexofenadine]    Levaquin [Levofloxacin] Swelling and Other (See Comments)    Hallucinations, swelling of throat   Nexium [Esomeprazole Magnesium]     diarrhea   Valerian Root Other (See Comments)    Loose stools    Family History:  Family History  Problem Relation Age of Onset   Dementia Mother    Heart disease Father    Emphysema Father        smoked and worked with asbestos   Colon cancer Neg Hx    Esophageal cancer Neg Hx    Rectal cancer Neg Hx    Migraines Neg Hx    Headache Neg Hx     Social History:  Social History   Tobacco Use   Smoking status:  Never   Smokeless tobacco: Never  Vaping Use   Vaping status: Never Used  Substance Use Topics   Alcohol use: Yes    Comment: 1 drink every 2 months   Drug use: No    Review of symptoms:  Constitutional:  Negative for unexplained weight loss, night sweats, fever, chills ENT:  Negative for nose bleeds, sinus pain, painful swallowing CV:  Negative for chest pain, shortness of breath, exercise intolerance, palpitations, loss of consciousness Resp:  Negative for cough, wheezing, shortness of breath GI:  Negative for nausea, vomiting, diarrhea, bloody stools GU:  Positives noted in HPI; otherwise negative for gross hematuria, dysuria, urinary incontinence Neuro:  Negative for seizures, poor balance, limb weakness, slurred speech Psych:  Negative for lack of  energy, depression, anxiety Endocrine:  Negative for polydipsia, polyuria, symptoms of hypoglycemia (dizziness, hunger, sweating) Hematologic:  Negative for anemia, purpura, petechia, prolonged or excessive bleeding, use of anticoagulants  Allergic:  Negative for difficulty breathing or choking as a result of exposure to anything; no shellfish allergy; no allergic response (rash/itch) to materials, foods  Physical exam: BP (!) 156/90   Pulse (!) 59   Ht 5\' 1"  (1.549 m)   Wt 120 lb (54.4 kg)   BMI 22.67 kg/m  GENERAL APPEARANCE:  Well appearing, well developed, well nourished, NAD     Results: Urinalysis today is negative for infection and hematuria

## 2023-04-30 NOTE — Progress Notes (Signed)
MRI of the pelvis shows advanced tendinitis and a partial-thickness tear of the tendons in the left hip causing pain and some of the bursitis.  You have a mild tendinitis on the right side.  You do have some arthritis in both hips and in the SI joint but no fractures are present.  The most important finding on this MRI is the advanced tendinitis and partial-thickness tear on the side of the hip on the left side.  Surgery may be an option.  Recommend return to clinic to go over the results of the MRI in full detail and to talk about options including potential surgery or other kinds of injections perhaps.

## 2023-04-30 NOTE — Progress Notes (Unsigned)
Emily Payor, PhD, LAT, ATC acting as a scribe for Emily Graham, MD.  Emily [name] is a 82 y.o. female who presents to [facility] at [facility] today for f/u L posterior hip pain w/ MRI review. Pt was last seen by Dr. Denyse Amass on 03/19/23 and a MRI was ordered. Pt completed a prior course of PT at the [facility] location. She had a L GT steroid injection on 08/27/22.   Today, pt reports L hip pain is about the same. She c/o pain w/ sitting and states the pain feels like a "nerve pain." She locates pain to all over the posterior aspect of her L hip/buttock.  Dx imaging: 04/14/23 Pelvis MRI 03/19/23 L hip XR 04/02/22 L-spine XR   Pertinent review of systems: No fevers or chills  Relevant historical information: Osteoporosis   Exam:  BP 136/80   Pulse 64   Ht 5\' 1"  (1.549 m)   Wt 124 lb (56.2 kg)   SpO2 95%   BMI 23.43 kg/m  General: Well Developed, well nourished, and in no acute distress.   MSK: Left hip pain with abduction    Lab and Radiology Results  EXAM: MRI PELVIS WITHOUT CONTRAST   TECHNIQUE: Multiplanar multisequence MR imaging of the pelvis was performed. No intravenous contrast was administered.   COMPARISON:  CT 04/11/2023   FINDINGS: Bones/Joint/Cartilage   No acute fracture. No dislocation. No femoral head avascular necrosis. Bony pelvis intact without diastasis. Mild osteoarthritis of the bilateral hips, left greater than right. Small subchondral cystic changes in the superior acetabula. No hip joint effusions. Minimal degenerative changes at the sacroiliac joints. No SI joint effusion or erosion. Pubic symphysis within normal limits. No bone marrow edema. No marrow replacing bone lesion.   Ligaments   Intact.   Muscles and Tendons   Advanced tendinosis and partial-thickness insertional tearing of the left gluteus medius and minimus tendons. Mild tendinosis of the right gluteus medius tendon. The hamstring, iliopsoas,  rectus femoris, and adductor tendons appear intact without tear or significant tendinosis. Left gluteus minimus muscle atrophy.   Soft tissues   Left peritrochanteric bursal edema with small volume bursal fluid. No additional sites of soft tissue edema. No pelvic or inguinal lymphadenopathy. Colonic diverticulosis.   IMPRESSION: 1. Advanced tendinosis and partial-thickness insertional tearing of the left gluteus medius and minimus tendons. Left peritrochanteric bursitis. 2. Mild tendinosis of the right gluteus medius tendon. 3. No acute osseous abnormality of the pelvis or hips. 4. Mild osteoarthritis of the bilateral hips, left greater than right. 5. Minimal degenerative changes of the SI joints.     Electronically Signed   By: Duanne Guess D.O.   On: 04/29/2023 12:47   I, Emily [name], personally (independently) visualized and performed the interpretation of the images attached in this note.     Assessment and Plan: 82 y.o. female with chronic left lateral hip pain due to the partial-thickness tear and tendinosis of the gluteus medius and minimus tendons.  She does have some atrophy of the gluteus minimus muscle.  She has failed to improve despite conservative management including steroid injections and physical therapy.  MRI shows tearing and atrophy sufficient enough that I am worried she will not get better on her own with conservative management.  After discussion we will refer to orthopedic surgery to discuss her options.  We did talk about PRP.  I am not very optimistic that PRP would help much but certainly could be done as an option  if surgery is not what she wants to do or if she wants to try something before surgery.   PDMP not reviewed this encounter. Orders Placed This Encounter  Procedures   Ambulatory referral to Orthopedic Surgery    Referral Priority:   Routine    Referral Type:   Surgical    Referral Reason:   Specialty Services Required    Referred to  Provider:   Huel Cote, MD    Requested Specialty:   Orthopedic Surgery    Number of Visits Requested:   1   No orders of the defined types were placed in this encounter.    Discussed warning signs or symptoms. Please see discharge instructions. Patient expresses understanding.   The above documentation has been reviewed and is accurate and complete Emily [name], M.D.

## 2023-05-01 ENCOUNTER — Ambulatory Visit: Payer: Medicare HMO | Admitting: Family Medicine

## 2023-05-01 VITALS — BP 136/80 | HR 64 | Ht 61.0 in | Wt 124.0 lb

## 2023-05-01 DIAGNOSIS — S76012A Strain of muscle, fascia and tendon of left hip, initial encounter: Secondary | ICD-10-CM

## 2023-05-01 DIAGNOSIS — S76019A Strain of muscle, fascia and tendon of unspecified hip, initial encounter: Secondary | ICD-10-CM

## 2023-05-01 NOTE — Patient Instructions (Addendum)
Thank you for coming in today.   You should hear from Dr Serena Croissant office shortly.   Let me know if this is not working.

## 2023-05-04 DIAGNOSIS — S63642A Sprain of metacarpophalangeal joint of left thumb, initial encounter: Secondary | ICD-10-CM | POA: Diagnosis not present

## 2023-05-04 DIAGNOSIS — X501XXA Overexertion from prolonged static or awkward postures, initial encounter: Secondary | ICD-10-CM | POA: Diagnosis not present

## 2023-05-04 DIAGNOSIS — S63641A Sprain of metacarpophalangeal joint of right thumb, initial encounter: Secondary | ICD-10-CM | POA: Diagnosis not present

## 2023-05-09 ENCOUNTER — Ambulatory Visit (HOSPITAL_BASED_OUTPATIENT_CLINIC_OR_DEPARTMENT_OTHER): Payer: Medicare HMO | Admitting: Orthopaedic Surgery

## 2023-05-09 ENCOUNTER — Ambulatory Visit (HOSPITAL_BASED_OUTPATIENT_CLINIC_OR_DEPARTMENT_OTHER): Payer: Self-pay | Admitting: Orthopaedic Surgery

## 2023-05-09 ENCOUNTER — Other Ambulatory Visit (HOSPITAL_BASED_OUTPATIENT_CLINIC_OR_DEPARTMENT_OTHER): Payer: Self-pay

## 2023-05-09 DIAGNOSIS — M67952 Unspecified disorder of synovium and tendon, left thigh: Secondary | ICD-10-CM

## 2023-05-09 MED ORDER — ASPIRIN 325 MG PO TBEC
325.0000 mg | DELAYED_RELEASE_TABLET | Freq: Every day | ORAL | 0 refills | Status: DC
  Filled 2023-05-09: qty 14, 14d supply, fill #0

## 2023-05-09 MED ORDER — IBUPROFEN 800 MG PO TABS
800.0000 mg | ORAL_TABLET | Freq: Three times a day (TID) | ORAL | 0 refills | Status: AC
  Filled 2023-05-09: qty 30, 10d supply, fill #0

## 2023-05-09 MED ORDER — OXYCODONE HCL 5 MG PO TABS
5.0000 mg | ORAL_TABLET | ORAL | 0 refills | Status: DC | PRN
  Filled 2023-05-09: qty 10, 2d supply, fill #0

## 2023-05-09 MED ORDER — ACETAMINOPHEN 500 MG PO TABS
500.0000 mg | ORAL_TABLET | Freq: Three times a day (TID) | ORAL | 0 refills | Status: AC
  Filled 2023-05-09: qty 30, 10d supply, fill #0

## 2023-05-09 NOTE — Progress Notes (Signed)
Chief Complaint: left hip gluteus tearing     History of Present Illness:    Emily [name] is a 82 y.o. female presents today with ongoing left hip pain.  She has been having an extremely difficult time with strength on the side and has had somewhat of a Trendelenburg gait.  She is being seen by Dr. Denyse Amass.  She is having a very difficult time going up and down stairs.  She did have an injection in January that gave her temporary relief.  She did do physical therapy as well and has been strengthening the left hip.  She has been having some limited results although is still having difficulties with balance and most activities of daily living.  The pain is predominantly lateral.  She does experience some radiating symptoms into the gluteus on the left.  She is here today with her husband who is a [place] veteran.    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   L s/p lumpectomy, chemo and XRT   Cervicalgia    Cholelithiasis    DIVERTICULOSIS, COLON    ENDOMETRIOSIS    GERD (gastroesophageal reflux disease)    Heart palpitations    Hypertension    HYPOTHYROIDISM    Left wrist fracture 11/06/2016   10/2016 - fell of high stool   Osteoporosis    Personal history of chemotherapy    Personal history of radiation therapy    Pleural plaque without asbestos    CT chest 10/2010: R>L lobes, upper> lower   Renal disorder    Squamous cell carcinoma    History of BC   Past Surgical History:  Procedure Laterality Date   BREAST LUMPECTOMY  2001   left   CATARACT EXTRACTION Bilateral 09/2014   both eyes   CHOLECYSTECTOMY  2010   ENDOMETRIAL ABLATION     EXCISION / BIOPSY BREAST / NIPPLE / DUCT Left 2001   Social History   Socioeconomic History   Marital  status: Married    Spouse name: Not on file   Number of children: 2   Years of education: Not on file   Highest education level: Not on file  Occupational History   Occupation: Retired    Comment: Advertising account executive  Tobacco Use   Smoking status: Never   Smokeless tobacco: Never  Vaping Use   Vaping status: Never Used  Substance and Sexual Activity   Alcohol use: Yes    Comment: 1 drink every 2 months   Drug use: No   Sexual activity: Not Currently    Birth control/protection: Post-menopausal  Other Topics Concern   Not on file  Social History Narrative   Married, lives in [place] area since 2008 from [place] to be close to dtr   Social Determinants of Health   Financial Resource Strain: Low Risk  (02/06/2023)   Overall Financial Resource Strain (CARDIA)    Difficulty of Paying Living Expenses: Not hard at all  Food Insecurity: No Food Insecurity (02/06/2023)   Hunger Vital Sign    Worried About Running Out of Food in the Last Year: Never true  Ran Out of Food in the Last Year: Never true  Transportation Needs: No Transportation Needs (02/06/2023)   PRAPARE - Administrator, Civil Service (Medical): No    Lack of Transportation (Non-Medical): No  Physical Activity: Sufficiently Active (02/06/2023)   Exercise Vital Sign    Days of Exercise per Week: 5 days    Minutes of Exercise per Session: 30 min  Stress: No Stress Concern Present (02/06/2023)   [employer] of Occupational Health - Occupational Stress Questionnaire    Feeling of Stress : Not at all  Social Connections: Socially Integrated (02/06/2023)   Social Connection and Isolation Panel [NHANES]    Frequency of Communication with Friends and Family: More than three times a week    Frequency of Social Gatherings with Friends and Family: More than three times a week    Attends Religious Services: More than 4 times per year    Active Member of [employer] or Organizations: Yes    Attends Hospital doctor: More than 4 times per year    Marital Status: Married   Family History  Problem Relation Age of Onset   Dementia Mother    Heart disease Father    Emphysema Father        smoked and worked with asbestos   Colon cancer Neg Hx    Esophageal cancer Neg Hx    Rectal cancer Neg Hx    Migraines Neg Hx    Headache Neg Hx    Allergies  Allergen Reactions   Covid-19 (Mrna) Vaccine Anaphylaxis   Molnupiravir Other (See Comments)    Tongue swelling   Allegra [Fexofenadine]    Levaquin [Levofloxacin] Swelling and Other (See Comments)    Hallucinations, swelling of throat   Nexium [Esomeprazole Magnesium]     diarrhea   Valerian Root Other (See Comments)    Loose stools   Current Outpatient Medications  Medication Sig Dispense Refill   acetaminophen (TYLENOL) 500 MG tablet Take 1 tablet (500 mg total) by mouth every 8 (eight) hours for 10 days. 30 tablet 0   aspirin EC 325 MG tablet Take 1 tablet (325 mg total) by mouth daily. 14 tablet 0   ibuprofen (ADVIL) 800 MG tablet Take 1 tablet (800 mg total) by mouth every 8 (eight) hours for 10 days. Please take with food, please alternate with acetaminophen 30 tablet 0   oxyCODONE (ROXICODONE) 5 MG immediate release tablet Take 1 tablet (5 mg total) by mouth every 4 (four) hours as needed for severe pain or breakthrough pain. 10 tablet 0   Acetaminophen (TYLENOL 8 HOUR ARTHRITIS PAIN PO) Take by mouth.     atenolol (TENORMIN) 25 MG tablet TAKE 1 TABLET BY MOUTH EVERY DAY 90 tablet 2   Calcium-Vitamin D-Vitamin K 750-500-40 MG-UNT-MCG TABS Take 1 tablet by mouth 2 (two) times daily.     chlorpheniramine (CHLOR-TRIMETON) 4 MG tablet Take 4 mg by mouth 2 (two) times daily as needed for allergies.     cholecalciferol (VITAMIN D) 25 MCG (1000 UNIT) tablet Take 1,000 Units by mouth daily.     [employer] ALLERGY RELIEF D 5-120 MG tablet TAKE 1 TABLET BY MOUTH TWICE A DAY 48 tablet 14   EPINEPHrine 0.3 mg/0.3 mL IJ SOAJ injection Inject 0.3 mg into  the muscle as needed for anaphylaxis. 1 each 0   ipratropium (ATROVENT) 0.03 % nasal spray SPRAY 2 SPRAYS INTO EACH NOSTRIL EVERY 12 HOURS 30 mL 2   levothyroxine (SYNTHROID) 75  MCG tablet TAKE 1 TABLET BY MOUTH EVERY DAY 90 tablet 3   triamcinolone (KENALOG) 0.025 % ointment Apply 1 application topically 2 (two) times daily. Use as needed. 30 g 1   No current facility-administered medications for this visit.   No results found.  Review of Systems:   A ROS was performed including pertinent positives and negatives as documented in the HPI.  Physical Exam :   Constitutional: NAD and appears stated age Neurological: Alert and oriented Psych: Appropriate affect and cooperative There were no vitals taken for this visit.   Comprehensive Musculoskeletal Exam:    Inspection Right Left  Skin No atrophy or gross abnormalities appreciated No atrophy or gross abnormalities appreciated  Palpation    Tenderness None None  Crepitus None None  Range of Motion    Flexion (passive) 120 120  Extension 30 30  IR 30 30  ER 45 45  Strength    Flexion  5/5 5/5  Extension 5/5 5/5  Special Tests    FABER Negative Negative  FADIR Negative Negative  ER Lag/Capsular Insufficiency Negative Negative  Instability Negative Negative  Sacroiliac pain Negative  Negative   Instability    Generalized Laxity No No  Neurologic    sciatic, femoral, obturator nerves intact to light sensation  Vascular/Lymphatic    DP pulse 2+ 2+  Lumbar Exam    Patient has symmetric lumbar range of motion with negative pain referral to hip   Positive pain about left greater trochanter with weakness in resisted abduction, positive Trendelenburg  Imaging:   Xray (4 views left hip): Normal  MRI (left hip): Full-thickness gluteus medius tear with some mild atrophy on T1 of the gluteus medius  I personally reviewed and interpreted the radiographs.   Assessment:   82 y.o. female with a left hip gluteus medius and  minimus tear which has been quite problematic for her.  She has been doing physical therapy which has given her some improved strength although she is still persistently weak and painful particularly up and down stairs.  She is not able to lay directly on the side.  She has now had an additional injection without any relief.  Given this we did discuss the possibility of left hip gluteus medius repair.  I did discuss the anticipated impact that this would have on her gait and hopefully normalizing walking so as to minimize the risk of future falls.  We have discussed the risks and limitations.  After discussion she has elected for a left hip gluteus medius repair with collagen patch augmentation  Plan :    -Plan for left hip gluteus medius repair of collagen patch augmentation   After a lengthy discussion of treatment options, including risks, benefits, alternatives, complications of surgical and nonsurgical conservative options, the patient elected surgical repair.   The patient  is aware of the material risks  and complications including, but not limited to injury to adjacent structures, neurovascular injury, infection, numbness, bleeding, implant failure, thermal burns, stiffness, persistent pain, failure to heal, disease transmission from allograft, need for further surgery, dislocation, anesthetic risks, blood clots, risks of death,and others. The probabilities of surgical success and failure discussed with patient given their particular co-morbidities.The time and nature of expected rehabilitation and recovery was discussed.The patient's questions were all answered preoperatively.  No barriers to understanding were noted. I explained the natural history of the disease process and Rx rationale.  I explained to the patient what I considered to be reasonable expectations given  their personal situation.  The final treatment plan was arrived at through a shared patient decision making process  model.      I personally saw and evaluated the patient, and participated in the management and treatment plan.  Huel Cote, MD Attending Physician, Orthopedic Surgery  This document was dictated using Dragon voice recognition software. A reasonable attempt at proof reading has been made to minimize errors.

## 2023-05-09 NOTE — Addendum Note (Signed)
Addended by: Jeanella Cara on: 05/09/2023 02:48 PM   Modules accepted: Orders

## 2023-05-20 NOTE — Progress Notes (Unsigned)
Subjective:    Patient ID: Emily [name], female    DOB: Dec 02, 1940, 82 y.o.   MRN: [id number]      HPI Emily [name] is here for No chief complaint on file.  Has h/o occipital headaches   Migraine headache with nausea almost every night -     Medications and allergies reviewed with patient and updated if appropriate.  Current Outpatient Medications on File Prior to Visit  Medication Sig Dispense Refill   Acetaminophen (TYLENOL 8 HOUR ARTHRITIS PAIN PO) Take by mouth.     aspirin EC 325 MG tablet Take 1 tablet (325 mg total) by mouth daily. 14 tablet 0   atenolol (TENORMIN) 25 MG tablet TAKE 1 TABLET BY MOUTH EVERY DAY 90 tablet 2   Calcium-Vitamin D-Vitamin K 750-500-40 MG-UNT-MCG TABS Take 1 tablet by mouth 2 (two) times daily.     chlorpheniramine (CHLOR-TRIMETON) 4 MG tablet Take 4 mg by mouth 2 (two) times daily as needed for allergies.     cholecalciferol (VITAMIN D) 25 MCG (1000 UNIT) tablet Take 1,000 Units by mouth daily.     CVS ALLERGY RELIEF D 5-120 MG tablet TAKE 1 TABLET BY MOUTH TWICE A DAY 48 tablet 14   EPINEPHrine 0.3 mg/0.3 mL IJ SOAJ injection Inject 0.3 mg into the muscle as needed for anaphylaxis. 1 each 0   ipratropium (ATROVENT) 0.03 % nasal spray SPRAY 2 SPRAYS INTO EACH NOSTRIL EVERY 12 HOURS 30 mL 2   levothyroxine (SYNTHROID) 75 MCG tablet TAKE 1 TABLET BY MOUTH EVERY DAY 90 tablet 3   oxyCODONE (ROXICODONE) 5 MG immediate release tablet Take 1 tablet (5 mg total) by mouth every 4 (four) hours as needed for severe pain or breakthrough pain. 10 tablet 0   triamcinolone (KENALOG) 0.025 % ointment Apply 1 application topically 2 (two) times daily. Use as needed. 30 g 1   No current facility-administered medications on file prior to visit.    Review of Systems     Objective:  There were no vitals filed for this visit. BP Readings from Last 3 Encounters:  05/01/23 136/80  04/28/23 (!) 156/90  04/17/23 134/76   Wt Readings from Last 3 Encounters:   05/01/23 124 lb (56.2 kg)  04/28/23 120 lb (54.4 kg)  04/17/23 124 lb 6.4 oz (56.4 kg)   There is no height or weight on file to calculate BMI.    Physical Exam         Assessment & Plan:    See Problem List for Assessment and Plan of chronic medical problems.

## 2023-05-21 ENCOUNTER — Encounter: Payer: Self-pay | Admitting: Internal Medicine

## 2023-05-21 ENCOUNTER — Ambulatory Visit: Payer: Medicare HMO | Admitting: Internal Medicine

## 2023-05-21 VITALS — BP 130/80 | HR 62 | Temp 98.0°F | Ht 61.0 in | Wt 124.0 lb

## 2023-05-21 DIAGNOSIS — G4486 Cervicogenic headache: Secondary | ICD-10-CM | POA: Diagnosis not present

## 2023-05-21 DIAGNOSIS — Z23 Encounter for immunization: Secondary | ICD-10-CM

## 2023-05-21 DIAGNOSIS — R195 Other fecal abnormalities: Secondary | ICD-10-CM | POA: Diagnosis not present

## 2023-05-21 NOTE — Patient Instructions (Addendum)
    Flu immunization administered today.     Medications you can consider   Gabapentin  - nerve pain medication Cymbalta - anti-depressant but also for nerve pain Anti-inflammatory - ( prescription like advil) - concern with stomach bleeding/irritation and kidney damage Nortriptyline - older anti-depressant and for nerve pain    A referral was ordered Dr Lucia Gaskins and someone will call you to schedule an appointment.

## 2023-05-21 NOTE — Assessment & Plan Note (Signed)
Still has 2 bowel movements daily in the morning-first 1 is solid and second 1 tends to be loose Taking half of Imodium does seem to help with the second looser bowel movement Discussed adding fiber to see if that helps regulate her stools a little bit more-she will try that Discussed with her do not think there is any concerns-no infection or other issue is more of a functional problem Can refer to GI if she wishes

## 2023-05-21 NOTE — Assessment & Plan Note (Signed)
Chronic Has seen orthopedics Had 2 blocks, but improvement was not significant enough to get an ablation covered-some of that is because she only has pain at night so it is difficult to truly say how much of an improvement there is with the blocks Currently taking-1 at night because two gives her side effects Uses heat, topical medications Has tried several different pillows Has headache, nausea and dry heaves on a regular basis-headache can be severe Will return to see Dr. Horald Chestnut to see about other options Would like a referral to see Dr. Andi Hence ordered Discussed some medication she could could try including gabapentin (she is reluctant because she did not tolerate this previously, but may try it again-still has some at home), Cymbalta, prescription NSAID (very reluctant because of possible effect on kidneys), nortriptyline She can look into some of these medications and discuss further with neurology

## 2023-06-16 ENCOUNTER — Encounter: Payer: Self-pay | Admitting: Internal Medicine

## 2023-06-19 ENCOUNTER — Encounter (HOSPITAL_BASED_OUTPATIENT_CLINIC_OR_DEPARTMENT_OTHER): Payer: Self-pay | Admitting: Orthopaedic Surgery

## 2023-06-20 NOTE — Telephone Encounter (Signed)
I spoke with the patient 06/19/23 after receiving a voicemail in reference to rescheduling surgery. The patient has been rescheduled for 08/26/23.

## 2023-06-24 DIAGNOSIS — L820 Inflamed seborrheic keratosis: Secondary | ICD-10-CM | POA: Diagnosis not present

## 2023-06-24 DIAGNOSIS — L7211 Pilar cyst: Secondary | ICD-10-CM | POA: Diagnosis not present

## 2023-06-27 ENCOUNTER — Ambulatory Visit (HOSPITAL_BASED_OUTPATIENT_CLINIC_OR_DEPARTMENT_OTHER): Payer: Medicare HMO | Admitting: Physical Therapy

## 2023-07-02 ENCOUNTER — Encounter (HOSPITAL_BASED_OUTPATIENT_CLINIC_OR_DEPARTMENT_OTHER): Payer: Medicare HMO | Admitting: Physical Therapy

## 2023-07-07 ENCOUNTER — Encounter: Payer: Self-pay | Admitting: Internal Medicine

## 2023-07-07 NOTE — Progress Notes (Deleted)
    Subjective:    Patient ID: Emily [name], female    DOB: November 22, 1940, 82 y.o.   MRN: [id number]      HPI Emily [name] is here for No chief complaint on file.    Sore throat, ear pain -   GI issues x 4 days     Medications and allergies reviewed with patient and updated if appropriate.  Current Outpatient Medications on File Prior to Visit  Medication Sig Dispense Refill   aspirin EC 325 MG tablet Take 1 tablet (325 mg total) by mouth daily. (Patient not taking: Reported on 06/17/2023) 14 tablet 0   atenolol (TENORMIN) 25 MG tablet TAKE 1 TABLET BY MOUTH EVERY DAY 90 tablet 2   Calcium-Vitamin D-Vitamin K 750-500-40 MG-UNT-MCG TABS Take 1 tablet by mouth daily.     cetirizine-pseudoephedrine (ZYRTEC-D) 5-120 MG tablet Take 1 tablet by mouth 2 (two) times daily as needed for allergies.     chlorpheniramine (CHLOR-TRIMETON) 4 MG tablet Take 4 mg by mouth 2 (two) times daily as needed for allergies.     cholecalciferol (VITAMIN D) 25 MCG (1000 UNIT) tablet Take 1,000 Units by mouth daily.     EPINEPHrine 0.3 mg/0.3 mL IJ SOAJ injection Inject 0.3 mg into the muscle as needed for anaphylaxis. 1 each 0   gabapentin (NEURONTIN) 100 MG capsule Take 100 mg by mouth at bedtime.     ipratropium (ATROVENT) 0.03 % nasal spray SPRAY 2 SPRAYS INTO EACH NOSTRIL EVERY 12 HOURS (Patient taking differently: Place 1 spray into both nostrils 2 (two) times daily as needed for rhinitis.) 30 mL 2   levothyroxine (SYNTHROID) 75 MCG tablet TAKE 1 TABLET BY MOUTH EVERY DAY 90 tablet 3   oxyCODONE (ROXICODONE) 5 MG immediate release tablet Take 1 tablet (5 mg total) by mouth every 4 (four) hours as needed for severe pain or breakthrough pain. (Patient not taking: Reported on 06/17/2023) 10 tablet 0   triamcinolone (KENALOG) 0.025 % ointment Apply 1 application topically 2 (two) times daily. Use as needed. 30 g 1   No current facility-administered medications on file prior to visit.    Review of  Systems     Objective:  There were no vitals filed for this visit. BP Readings from Last 3 Encounters:  05/21/23 130/80  05/01/23 136/80  04/28/23 (!) 156/90   Wt Readings from Last 3 Encounters:  05/21/23 124 lb (56.2 kg)  05/01/23 124 lb (56.2 kg)  04/28/23 120 lb (54.4 kg)   There is no height or weight on file to calculate BMI.    Physical Exam         Assessment & Plan:    See Problem List for Assessment and Plan of chronic medical problems.

## 2023-07-08 ENCOUNTER — Encounter (HOSPITAL_BASED_OUTPATIENT_CLINIC_OR_DEPARTMENT_OTHER): Payer: Medicare HMO | Admitting: Physical Therapy

## 2023-07-08 ENCOUNTER — Ambulatory Visit: Payer: Medicare HMO | Admitting: Internal Medicine

## 2023-07-09 ENCOUNTER — Encounter (HOSPITAL_BASED_OUTPATIENT_CLINIC_OR_DEPARTMENT_OTHER): Payer: Medicare HMO | Admitting: Physical Therapy

## 2023-07-09 ENCOUNTER — Encounter (HOSPITAL_BASED_OUTPATIENT_CLINIC_OR_DEPARTMENT_OTHER): Payer: Medicare HMO | Admitting: Orthopaedic Surgery

## 2023-07-09 ENCOUNTER — Encounter: Payer: Self-pay | Admitting: Internal Medicine

## 2023-07-09 ENCOUNTER — Ambulatory Visit: Payer: Medicare HMO | Admitting: Internal Medicine

## 2023-07-09 VITALS — BP 140/84 | HR 73 | Temp 98.0°F | Ht 61.0 in

## 2023-07-09 DIAGNOSIS — G8929 Other chronic pain: Secondary | ICD-10-CM

## 2023-07-09 DIAGNOSIS — M542 Cervicalgia: Secondary | ICD-10-CM | POA: Diagnosis not present

## 2023-07-09 MED ORDER — TIZANIDINE HCL 2 MG PO TABS: 2.0000 mg | ORAL_TABLET | Freq: Three times a day (TID) | ORAL | 0 refills | Status: DC | PRN

## 2023-07-09 MED ORDER — METHYLPREDNISOLONE ACETATE 80 MG/ML IJ SUSP
80.0000 mg | Freq: Once | INTRAMUSCULAR | Status: AC
Start: 2023-07-09 — End: 2023-07-09
  Administered 2023-07-09: 80 mg via INTRAMUSCULAR

## 2023-07-09 MED ORDER — TRAMADOL HCL 50 MG PO TABS: 50.0000 mg | ORAL_TABLET | Freq: Three times a day (TID) | ORAL | 0 refills | Status: AC | PRN

## 2023-07-09 NOTE — Patient Instructions (Addendum)
     You had a steroid injection today    Medications changes include :   tramadol 50 mg every 8 hours for pain.  Tizanidine 2-4 mg every 8 hours as needed for muscles      Return if symptoms worsen or fail to improve.

## 2023-07-09 NOTE — Progress Notes (Signed)
Subjective:    Patient ID: Emily [name], female    DOB: 03/11/1941, 82 y.o.   MRN: [id number]      HPI Emily [name] is here for  Chief Complaint  Patient presents with   Neck Pain    Neck pain that started yesterday (unable to turn neck from side to side or bend her head down)    Neck pain-8/10 in intensity-decreased range of motion.  She has chronic neck pain with cervicogenic headaches, but this current pain is much worse than usual.   Got up yesterday at 5 am - had severe pain in the morning.  She takes gabapentin at night which does help her sleep.  She took another gabapentin that morning and took Tylenol and ibuprofen all day long. Today she is a little better, but the pain is still severe.  The ibuprofen did help but does not last long.  She is not able to move her neck.  No radiating pain.  Medications and allergies reviewed with patient and updated if appropriate.  Current Outpatient Medications on File Prior to Visit  Medication Sig Dispense Refill   atenolol (TENORMIN) 25 MG tablet TAKE 1 TABLET BY MOUTH EVERY DAY 90 tablet 2   Calcium-Vitamin D-Vitamin K 750-500-40 MG-UNT-MCG TABS Take 1 tablet by mouth daily.     cetirizine-pseudoephedrine (ZYRTEC-D) 5-120 MG tablet Take 1 tablet by mouth 2 (two) times daily as needed for allergies.     chlorpheniramine (CHLOR-TRIMETON) 4 MG tablet Take 4 mg by mouth 2 (two) times daily as needed for allergies.     cholecalciferol (VITAMIN D) 25 MCG (1000 UNIT) tablet Take 1,000 Units by mouth daily.     EPINEPHrine 0.3 mg/0.3 mL IJ SOAJ injection Inject 0.3 mg into the muscle as needed for anaphylaxis. 1 each 0   gabapentin (NEURONTIN) 100 MG capsule Take 100 mg by mouth at bedtime.     ipratropium (ATROVENT) 0.03 % nasal spray SPRAY 2 SPRAYS INTO EACH NOSTRIL EVERY 12 HOURS (Patient taking differently: Place 1 spray into both nostrils 2 (two) times daily as needed for rhinitis.) 30 mL 2   levothyroxine (SYNTHROID) 75 MCG tablet TAKE  1 TABLET BY MOUTH EVERY DAY 90 tablet 3   triamcinolone (KENALOG) 0.025 % ointment Apply 1 application topically 2 (two) times daily. Use as needed. 30 g 1   No current facility-administered medications on file prior to visit.    Review of Systems     Objective:   Vitals:   07/09/23 0902 07/09/23 0944  BP: (!) 152/90 (!) 140/84  Pulse: 73   Temp: 98 F (36.7 C)   SpO2: 97%    BP Readings from Last 3 Encounters:  07/09/23 (!) 140/84  05/21/23 130/80  05/01/23 136/80   Wt Readings from Last 3 Encounters:  05/21/23 124 lb (56.2 kg)  05/01/23 124 lb (56.2 kg)  04/28/23 120 lb (54.4 kg)   Body mass index is 23.43 kg/m.    Physical Exam Constitutional:      General: She is not in acute distress.    Appearance: Normal appearance. She is not ill-appearing.  HENT:     Head: Normocephalic and atraumatic.  Musculoskeletal:        General: Tenderness (Posterior neck tenderness especially left side muscles already attached to the skull.  No tenderness along cervical spine.) present. No swelling or deformity.     Comments: Decreased range of motion and movement of the head does cause pain  Skin:  General: Skin is warm and dry.     Findings: No erythema or rash.  Neurological:     Mental Status: She is alert.            Assessment & Plan:    See Problem List for Assessment and Plan of chronic medical problems.

## 2023-07-09 NOTE — Assessment & Plan Note (Signed)
Acute on chronic neck pain Has chronic pain but yesterday woke up with severe pain in the neck-especially left side associated with decreased range of motion Pain slightly better today, but still severe No radiculopathy, no cervicogenic headaches Likely combination of osteoarthritis and muscle spasm Depo-Medrol 80 mg IM x 1 Tizanidine 2-4 mg every 8 hours as needed-discussed this can cause some drowsiness and she should use it with caution especially during the day Tramadol 50 mg every 8 hours as needed for severe pain Continue ice, heat She will update me with how she is doing and we can adjust treatment as needed

## 2023-07-10 ENCOUNTER — Encounter (HOSPITAL_COMMUNITY): Payer: Self-pay

## 2023-07-10 ENCOUNTER — Other Ambulatory Visit: Payer: Self-pay

## 2023-07-10 ENCOUNTER — Emergency Department (HOSPITAL_COMMUNITY)
Admission: EM | Admit: 2023-07-10 | Discharge: 2023-07-10 | Disposition: A | Discharge status: Home / Self Care | Payer: Medicare HMO | Attending: Student | Admitting: Student

## 2023-07-10 DIAGNOSIS — N1831 Chronic kidney disease, stage 3a: Secondary | ICD-10-CM | POA: Diagnosis not present

## 2023-07-10 DIAGNOSIS — Z853 Personal history of malignant neoplasm of breast: Secondary | ICD-10-CM | POA: Diagnosis not present

## 2023-07-10 DIAGNOSIS — I129 Hypertensive chronic kidney disease with stage 1 through stage 4 chronic kidney disease, or unspecified chronic kidney disease: Secondary | ICD-10-CM | POA: Diagnosis not present

## 2023-07-10 DIAGNOSIS — E039 Hypothyroidism, unspecified: Secondary | ICD-10-CM | POA: Diagnosis not present

## 2023-07-10 DIAGNOSIS — Z79899 Other long term (current) drug therapy: Secondary | ICD-10-CM | POA: Insufficient documentation

## 2023-07-10 DIAGNOSIS — Z85828 Personal history of other malignant neoplasm of skin: Secondary | ICD-10-CM | POA: Insufficient documentation

## 2023-07-10 DIAGNOSIS — M542 Cervicalgia: Secondary | ICD-10-CM | POA: Diagnosis not present

## 2023-07-10 LAB — CBC
HCT: 42.2 % (ref 36.0–46.0)
Hemoglobin: 13.4 g/dL (ref 12.0–15.0)
MCH: 29.8 pg (ref 26.0–34.0)
MCHC: 31.8 g/dL (ref 30.0–36.0)
MCV: 94 fL (ref 80.0–100.0)
Platelets: 216 10*3/uL (ref 150–400)
RBC: 4.49 MIL/uL (ref 3.87–5.11)
RDW: 12 % (ref 11.5–15.5)
WBC: 8.2 10*3/uL (ref 4.0–10.5)
nRBC: 0 % (ref 0.0–0.2)

## 2023-07-10 LAB — COMPREHENSIVE METABOLIC PANEL
ALT: 33 U/L (ref 0–44)
AST: 55 U/L — ABNORMAL HIGH (ref 15–41)
Albumin: 3.9 g/dL (ref 3.5–5.0)
Alkaline Phosphatase: 69 U/L (ref 38–126)
Anion gap: 10 (ref 5–15)
BUN: 25 mg/dL — ABNORMAL HIGH (ref 8–23)
CO2: 20 mmol/L — ABNORMAL LOW (ref 22–32)
Calcium: 8.9 mg/dL (ref 8.9–10.3)
Chloride: 104 mmol/L (ref 98–111)
Creatinine, Ser: 1.07 mg/dL — ABNORMAL HIGH (ref 0.44–1.00)
GFR, Estimated: 52 mL/min — ABNORMAL LOW (ref >60)
Glucose, Bld: 124 mg/dL — ABNORMAL HIGH (ref 70–99)
Potassium: 4.1 mmol/L (ref 3.5–5.1)
Sodium: 134 mmol/L — ABNORMAL LOW (ref 135–145)
Total Bilirubin: 0.5 mg/dL (ref <1.2)
Total Protein: 7.2 g/dL (ref 6.5–8.1)

## 2023-07-10 LAB — LIPASE, BLOOD: Lipase: 39 U/L (ref 11–51)

## 2023-07-10 MED ORDER — NAPROXEN 375 MG PO TABS: 375.0000 mg | ORAL_TABLET | Freq: Two times a day (BID) | ORAL | 0 refills | Status: DC

## 2023-07-10 MED ORDER — ACETAMINOPHEN 500 MG PO TABS: 1000.0000 mg | ORAL_TABLET | Freq: Three times a day (TID) | ORAL | 0 refills | Status: AC

## 2023-07-10 MED ORDER — LIDOCAINE 4 % EX PTCH: 1.0000 | MEDICATED_PATCH | Freq: Two times a day (BID) | CUTANEOUS | 0 refills | Status: DC

## 2023-07-10 MED ORDER — ONDANSETRON 4 MG PO TBDP: 4.0000 mg | ORAL_TABLET | Freq: Three times a day (TID) | ORAL | 0 refills | Status: DC | PRN

## 2023-07-10 MED ORDER — LIDOCAINE 5 % EX PTCH
1.0000 | MEDICATED_PATCH | CUTANEOUS | Status: DC
  Administered 2023-07-10: 1 via TRANSDERMAL
  Filled 2023-07-10: qty 1

## 2023-07-10 MED ORDER — ACETAMINOPHEN 500 MG PO TABS
1000.0000 mg | ORAL_TABLET | Freq: Once | ORAL | Status: AC
  Administered 2023-07-10: 1000 mg via ORAL
  Filled 2023-07-10: qty 2

## 2023-07-10 MED ORDER — ONDANSETRON 4 MG PO TBDP
4.0000 mg | ORAL_TABLET | Freq: Once | ORAL | Status: AC
  Administered 2023-07-10: 4 mg via ORAL
  Filled 2023-07-10: qty 1

## 2023-07-10 MED ORDER — KETOROLAC TROMETHAMINE 15 MG/ML IJ SOLN
15.0000 mg | Freq: Once | INTRAMUSCULAR | Status: AC
  Administered 2023-07-10: 15 mg via INTRAMUSCULAR
  Filled 2023-07-10: qty 1

## 2023-07-10 NOTE — ED Triage Notes (Signed)
Patient reports neck pain x a couple weeks. Seen at PCP yesterday and started taking tramadol and a muscle relaxer. Starting this AM patient reports nausea and vomiting.

## 2023-07-10 NOTE — Discharge Instructions (Signed)
For pain:  - Acetaminophen 1000 mg three times daily (every 8 hours) - Naproxen 2 times daily (every 12 hours) - lidoderm patches twice daily - gabapentin as prescribed with an additional dose for breakthrough pain

## 2023-07-10 NOTE — ED Provider Notes (Signed)
[place] EMERGENCY DEPARTMENT AT [facility] Provider Note  CSN: [id number] Arrival date & time: 07/10/23 4010  Chief Complaint(s) Neck Pain  HPI Emily [name] is a 82 y.o. female with PMH chronic neck pain, endometriosis, hypothyroidism who presents emergency department for evaluation of neck pain, nausea and vomiting.  Patient states that she normally sees a neurosurgeon who administers injections in her neck but he is out of town and will not be back until next week.  She saw her PCP who placed her on tramadol and muscle relaxers.  She subsequently did not tolerate these medications very well and arrives with nausea vomiting and fatigue.  Neck pain not improved.  Denies numbness, tingling, weakness or other neurologic complaints.  Denies chest pain, shortness of breath, headache, fever or other systemic symptoms.   Past Medical History Past Medical History:  Diagnosis Date   Actinic keratosis    Allergic rhinitis    BACK PAIN    CARCINOMA, BREAST, 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   L s/p lumpectomy, chemo and XRT   Cervicalgia    Cholelithiasis    DIVERTICULOSIS, COLON    ENDOMETRIOSIS    GERD (gastroesophageal reflux disease)    Heart palpitations    Hypertension    HYPOTHYROIDISM    Left wrist fracture 11/06/2016   10/2016 - fell of high stool   Osteoporosis    Personal history of chemotherapy    Personal history of radiation therapy    Pleural plaque without asbestos    CT chest 10/2010: R>L lobes, upper> lower   Renal disorder    Squamous cell carcinoma    History of BC   Patient Active Problem List   Diagnosis Date Noted   Acute cystitis 04/16/2023   Change in bowel habits 01/15/2023   Anxiety 01/01/2023   Agatston coronary artery calcium score 22.4  (12/2021) 10/30/2022   Urge incontinence of urine 10/30/2022   Word finding difficulty 10/30/2022   Chronic pain of left knee 08/21/2022   Lumbar radiculopathy 03/25/2022   Cough 12/27/2021   Sleep  difficulties 09/27/2021   Cervicogenic headache 04/10/2021   Elevated blood pressure reading 04/10/2021   Skin abnormality 02/27/2021   Aortic atherosclerosis (HCC) 09/22/2020   Urinary urgency 03/23/2020   CKD (chronic kidney disease) stage 3, GFR 30-59 ml/min (HCC) 03/22/2020   Prediabetes 03/22/2020   Change in stool 11/17/2019   Right-sided chest wall pain 11/17/2019   Urinary retention 09/23/2019   SOB (shortness of breath) 01/15/2019   Upper airway cough syndrome 01/15/2019   Belching 12/14/2018   Reactive airway disease 12/05/2018   Tear of left rotator cuff 10/05/2018   Pre-syncope 05/19/2018   DDD (degenerative disc disease), cervical 01/15/2018   Neuralgia 09/12/2017   Presbycusis of both ears 12/11/2016   Memory difficulties 11/06/2016   IBS (irritable bowel syndrome) 04/16/2016   Hyperlipidemia 09/10/2015   Osteoporosis 09/08/2015   Palpitations    Pleural plaque without asbestos 12/03/2010   Chronic neck pain 12/18/2009   Diverticulosis of colon 03/02/2009   Lumbago 03/11/2008   Actinic keratosis 01/23/2008   Malignant neoplasm of female breast (HCC) 10/12/2007   Hypothyroidism 06/18/2007   Allergic rhinitis 06/11/2007   Home Medication(s) Prior to Admission medications   Medication Sig Start Date End Date Taking? Authorizing Provider  atenolol (TENORMIN) 25 MG tablet TAKE 1 TABLET BY MOUTH EVERY DAY 02/10/23   Pincus Sanes, MD  Calcium-Vitamin D-Vitamin K 750-500-40 MG-UNT-MCG TABS Take 1 tablet by mouth daily.  [provider]  cetirizine-pseudoephedrine (ZYRTEC-D) 5-120 MG tablet Take 1 tablet by mouth 2 (two) times daily as needed for allergies.    [provider]  chlorpheniramine (CHLOR-TRIMETON) 4 MG tablet Take 4 mg by mouth 2 (two) times daily as needed for allergies.    [provider]  cholecalciferol (VITAMIN D) 25 MCG (1000 UNIT) tablet Take 1,000 Units by mouth daily.    [provider]  EPINEPHrine 0.3 mg/0.3  mL IJ SOAJ injection Inject 0.3 mg into the muscle as needed for anaphylaxis. 05/21/21   Horton, Mayer Masker, MD  gabapentin (NEURONTIN) 100 MG capsule Take 100 mg by mouth at bedtime. 06/09/23   [provider]  ipratropium (ATROVENT) 0.03 % nasal spray SPRAY 2 SPRAYS INTO EACH NOSTRIL EVERY 12 HOURS Patient taking differently: Place 1 spray into both nostrils 2 (two) times daily as needed for rhinitis. 12/19/22   Pincus Sanes, MD  levothyroxine (SYNTHROID) 75 MCG tablet TAKE 1 TABLET BY MOUTH EVERY DAY 11/18/22   Pincus Sanes, MD  tiZANidine (ZANAFLEX) 2 MG tablet Take 1-2 tablets (2-4 mg total) by mouth every 8 (eight) hours as needed for muscle spasms. 07/09/23   Pincus Sanes, MD  traMADol (ULTRAM) 50 MG tablet Take 1 tablet (50 mg total) by mouth every 8 (eight) hours as needed for up to 5 days for severe pain (pain score 7-10). 07/09/23 07/14/23  Pincus Sanes, MD  triamcinolone (KENALOG) 0.025 % ointment Apply 1 application topically 2 (two) times daily. Use as needed. 03/29/21   Patton Salles, MD                                                                                                                                    Past Surgical History Past Surgical History:  Procedure Laterality Date   BREAST LUMPECTOMY  2001   left   CATARACT EXTRACTION Bilateral 09/2014   both eyes   CHOLECYSTECTOMY  2010   ENDOMETRIAL ABLATION     EXCISION / BIOPSY BREAST / NIPPLE / DUCT Left 2001   Family History Family History  Problem Relation Age of Onset   Dementia Mother    Heart disease Father    Emphysema Father        smoked and worked with asbestos   Colon cancer Neg Hx    Esophageal cancer Neg Hx    Rectal cancer Neg Hx    Migraines Neg Hx    Headache Neg Hx     Social History Social History   Tobacco Use   Smoking status: Never   Smokeless tobacco: Never  Vaping Use   Vaping status: Never Used  Substance Use Topics   Alcohol use: Yes    Comment: 1  drink every 2 months   Drug use: No   Allergies Covid-19 (mrna) vaccine, Molnupiravir, Allegra [fexofenadine], Levaquin [levofloxacin], Nexium [esomeprazole magnesium], and Valerian root  Review of Systems Review of Systems  Gastrointestinal:  Positive for nausea and vomiting.  Musculoskeletal:  Positive for neck pain.    Physical Exam Vital Signs  I have reviewed the triage vital signs BP (!) 141/90   Pulse 79   Temp (!) 97.4 F (36.3 C) (Oral)   Resp 16   SpO2 95%   Physical Exam Vitals and nursing note reviewed.  Constitutional:      General: She is not in acute distress.    Appearance: She is well-developed.  HENT:     Head: Normocephalic and atraumatic.  Eyes:     Conjunctiva/sclera: Conjunctivae normal.  Cardiovascular:     Rate and Rhythm: Normal rate and regular rhythm.     Heart sounds: No murmur heard. Pulmonary:     Effort: Pulmonary effort is normal. No respiratory distress.     Breath sounds: Normal breath sounds.  Abdominal:     Palpations: Abdomen is soft.     Tenderness: There is no abdominal tenderness.  Musculoskeletal:        General: No swelling or tenderness.     Cervical back: Neck supple.  Skin:    General: Skin is warm and dry.     Capillary Refill: Capillary refill takes less than 2 seconds.  Neurological:     Mental Status: She is alert.  Psychiatric:        Mood and Affect: Mood normal.     ED Results and Treatments Labs (all labs ordered are listed, but only abnormal results are displayed) Labs Reviewed  COMPREHENSIVE METABOLIC PANEL - Abnormal; Notable for the following components:      Result Value   Sodium 134 (*)    CO2 20 (*)    Glucose, Bld 124 (*)    BUN 25 (*)    Creatinine, Ser 1.07 (*)    AST 55 (*)    GFR, Estimated 52 (*)    All other components within normal limits  CBC  LIPASE, BLOOD  URINALYSIS, ROUTINE W REFLEX MICROSCOPIC                                                                                                                           Radiology No results found.  Pertinent labs & imaging results that were available during my care of the patient were reviewed by me and considered in my medical decision making (see MDM for details).  Medications Ordered in ED Medications  ketorolac (TORADOL) 15 MG/ML injection 15 mg (has no administration in time range)  lidocaine (LIDODERM) 5 % 1 patch (has no administration in time range)  acetaminophen (TYLENOL) tablet 1,000 mg (has no administration in time range)  ondansetron (ZOFRAN-ODT) disintegrating tablet 4 mg (4 mg Oral Given 07/10/23 5188)  Procedures Procedures  (including critical care time)  Medical Decision Making / ED Course   This patient presents to the ED for concern of neck pain, vomiting, this involves an extensive number of treatment options, and is a complaint that carries with it a high risk of complications and morbidity.  The differential diagnosis includes medication side effect, medication intolerance, acute on chronic neck pain, radiculopathy  MDM: Patient seen emergency room for evaluation of neck pain nausea and vomiting.  Physical exam with tenderness in the C-spine but is otherwise unremarkable.  Neurologic exam unremarkable with no focal motor or sensory deficits.  Zofran given for nausea leading to symptomatic improvement.  Laboratory evaluation with no significant leukocytosis, BUN 25, creatinine 1.07, AST 55 likely elevated in the setting of vomiting.  Lipase is normal.  Patient given Toradol, Lidoderm and Tylenol and on reevaluation her neck pain has improved.  We had an extended discussion about the side effect profile of opioids and partial opioid agonist especially in combination with antihistamine based medications like tizanidine.  It appears that this regimen is not  well-tolerated by the patient and we will transition her to a Tylenol regimen with a short course of Naprosyn, Lidoderm and gabapentin as needed.  At this time she does not meet inpatient criteria for admission and will be discharged with outpatient follow-up.  Likely would benefit from outpatient neurosurgical follow-up for her routine injections when her neurosurgeon returns from out of town.  Given return precautions of which she voiced understanding she was discharged.   Additional history obtained: -Additional history obtained from daughter -External records from outside source obtained and reviewed including: Chart review including previous notes, labs, imaging, consultation notes   Lab Tests: -I ordered, reviewed, and interpreted labs.   The pertinent results include:   Labs Reviewed  COMPREHENSIVE METABOLIC PANEL - Abnormal; Notable for the following components:      Result Value   Sodium 134 (*)    CO2 20 (*)    Glucose, Bld 124 (*)    BUN 25 (*)    Creatinine, Ser 1.07 (*)    AST 55 (*)    GFR, Estimated 52 (*)    All other components within normal limits  CBC  LIPASE, BLOOD  URINALYSIS, ROUTINE W REFLEX MICROSCOPIC       Medicines ordered and prescription drug management: Meds ordered this encounter  Medications   ondansetron (ZOFRAN-ODT) disintegrating tablet 4 mg   ketorolac (TORADOL) 15 MG/ML injection 15 mg   lidocaine (LIDODERM) 5 % 1 patch   acetaminophen (TYLENOL) tablet 1,000 mg    -I have reviewed the patients home medicines and have made adjustments as needed  Critical interventions none   Cardiac Monitoring: The patient was maintained on a cardiac monitor.  I personally viewed and interpreted the cardiac monitored which showed an underlying rhythm of: NSR  Social Determinants of Health:  Factors impacting patients care include: none   Reevaluation: After the interventions noted above, I reevaluated the patient and found that they have  :improved  Co morbidities that complicate the patient evaluation  Past Medical History:  Diagnosis Date   Actinic keratosis    Allergic rhinitis    BACK PAIN    CARCINOMA, BREAST, 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   L s/p lumpectomy, chemo and XRT   Cervicalgia    Cholelithiasis    DIVERTICULOSIS, COLON    ENDOMETRIOSIS    GERD (gastroesophageal reflux disease)    Heart palpitations    Hypertension  HYPOTHYROIDISM    Left wrist fracture 11/06/2016   10/2016 - fell of high stool   Osteoporosis    Personal history of chemotherapy    Personal history of radiation therapy    Pleural plaque without asbestos    CT chest 10/2010: R>L lobes, upper> lower   Renal disorder    Squamous cell carcinoma    History of BC      Dispostion: I considered admission for this patient, but at this time she does not meet inpatient criteria for admission and will be discharged with outpatient follow-up     Final Clinical Impression(s) / ED Diagnoses Final diagnoses:  None     @PCDICTATION @    Glendora Score, MD 07/10/23 [phone]

## 2023-07-11 ENCOUNTER — Encounter: Payer: Self-pay | Admitting: Internal Medicine

## 2023-07-11 DIAGNOSIS — M48061 Spinal stenosis, lumbar region without neurogenic claudication: Secondary | ICD-10-CM | POA: Diagnosis not present

## 2023-07-11 DIAGNOSIS — M5412 Radiculopathy, cervical region: Secondary | ICD-10-CM | POA: Diagnosis not present

## 2023-07-11 DIAGNOSIS — M5030 Other cervical disc degeneration, unspecified cervical region: Secondary | ICD-10-CM | POA: Diagnosis not present

## 2023-07-15 ENCOUNTER — Telehealth: Payer: Self-pay

## 2023-07-15 NOTE — Telephone Encounter (Signed)
Please provide updated PA information for patients Prolia, scheduled on 07/25/2023.

## 2023-07-16 ENCOUNTER — Encounter (HOSPITAL_BASED_OUTPATIENT_CLINIC_OR_DEPARTMENT_OTHER): Payer: Medicare HMO | Admitting: Physical Therapy

## 2023-07-16 ENCOUNTER — Encounter (HOSPITAL_BASED_OUTPATIENT_CLINIC_OR_DEPARTMENT_OTHER): Payer: Medicare HMO | Admitting: Orthopaedic Surgery

## 2023-07-21 DIAGNOSIS — M5412 Radiculopathy, cervical region: Secondary | ICD-10-CM | POA: Diagnosis not present

## 2023-07-22 ENCOUNTER — Encounter: Payer: Self-pay | Admitting: Internal Medicine

## 2023-07-22 NOTE — Telephone Encounter (Signed)
Patient is cleared for PROLIA injection; per PA information on 07/15/2023.  Mentioned in provider note last on 10/2022. Medication is PHARMACY supplied. CoPay:$100  Ready to schedule once medication onsite.

## 2023-07-23 ENCOUNTER — Encounter (HOSPITAL_BASED_OUTPATIENT_CLINIC_OR_DEPARTMENT_OTHER): Payer: Medicare HMO

## 2023-07-24 NOTE — Telephone Encounter (Signed)
Appointment made for 08/04/23.

## 2023-07-25 ENCOUNTER — Encounter (HOSPITAL_BASED_OUTPATIENT_CLINIC_OR_DEPARTMENT_OTHER): Payer: Medicare HMO | Admitting: Physical Therapy

## 2023-07-25 ENCOUNTER — Other Ambulatory Visit: Payer: Self-pay | Admitting: Family Medicine

## 2023-07-25 ENCOUNTER — Ambulatory Visit: Payer: Medicare HMO

## 2023-07-25 DIAGNOSIS — M5442 Lumbago with sciatica, left side: Secondary | ICD-10-CM

## 2023-07-25 NOTE — Telephone Encounter (Signed)
Last OV 05/01/23 Next OV not scheduled  Last refill 06/09/23 - historical provider  Not previously prescribed by Dr. Denyse Amass and d/c at visit 02/07/23.  Forwarding to Dr. Denyse Amass to approve/deny request

## 2023-07-30 ENCOUNTER — Encounter (HOSPITAL_BASED_OUTPATIENT_CLINIC_OR_DEPARTMENT_OTHER): Payer: Medicare HMO

## 2023-08-01 ENCOUNTER — Encounter (HOSPITAL_BASED_OUTPATIENT_CLINIC_OR_DEPARTMENT_OTHER): Payer: Medicare HMO | Admitting: Physical Therapy

## 2023-08-04 ENCOUNTER — Ambulatory Visit: Payer: Medicare HMO

## 2023-08-04 DIAGNOSIS — M810 Age-related osteoporosis without current pathological fracture: Secondary | ICD-10-CM

## 2023-08-04 MED ORDER — DENOSUMAB 60 MG/ML ~~LOC~~ SOSY: 60.0000 mg | PREFILLED_SYRINGE | Freq: Once | SUBCUTANEOUS | Status: AC

## 2023-08-04 MED ORDER — DENOSUMAB 60 MG/ML ~~LOC~~ SOSY
60.0000 mg | PREFILLED_SYRINGE | Freq: Once | SUBCUTANEOUS | Status: AC
  Administered 2023-08-04: 60 mg via SUBCUTANEOUS

## 2023-08-04 NOTE — Progress Notes (Signed)
After obtaining consent, and per orders of Dr. Burns, injection of Prolia given by Treyvonne Tata P Tanishia Lemaster. Patient instructed to report any adverse reaction to me immediately.  

## 2023-08-19 DIAGNOSIS — G4486 Cervicogenic headache: Secondary | ICD-10-CM | POA: Diagnosis not present

## 2023-08-19 DIAGNOSIS — M5412 Radiculopathy, cervical region: Secondary | ICD-10-CM | POA: Diagnosis not present

## 2023-08-19 DIAGNOSIS — M47812 Spondylosis without myelopathy or radiculopathy, cervical region: Secondary | ICD-10-CM | POA: Diagnosis not present

## 2023-08-19 DIAGNOSIS — M5030 Other cervical disc degeneration, unspecified cervical region: Secondary | ICD-10-CM | POA: Diagnosis not present

## 2023-08-19 IMAGING — DX DG CHEST 2V
2 series · 2 of 2 positions shown · non-contrast
Comparison: December 04, 2021

CLINICAL DATA: Chest pain.

EXAM:
CHEST - 2 VIEW

[w chest pa]
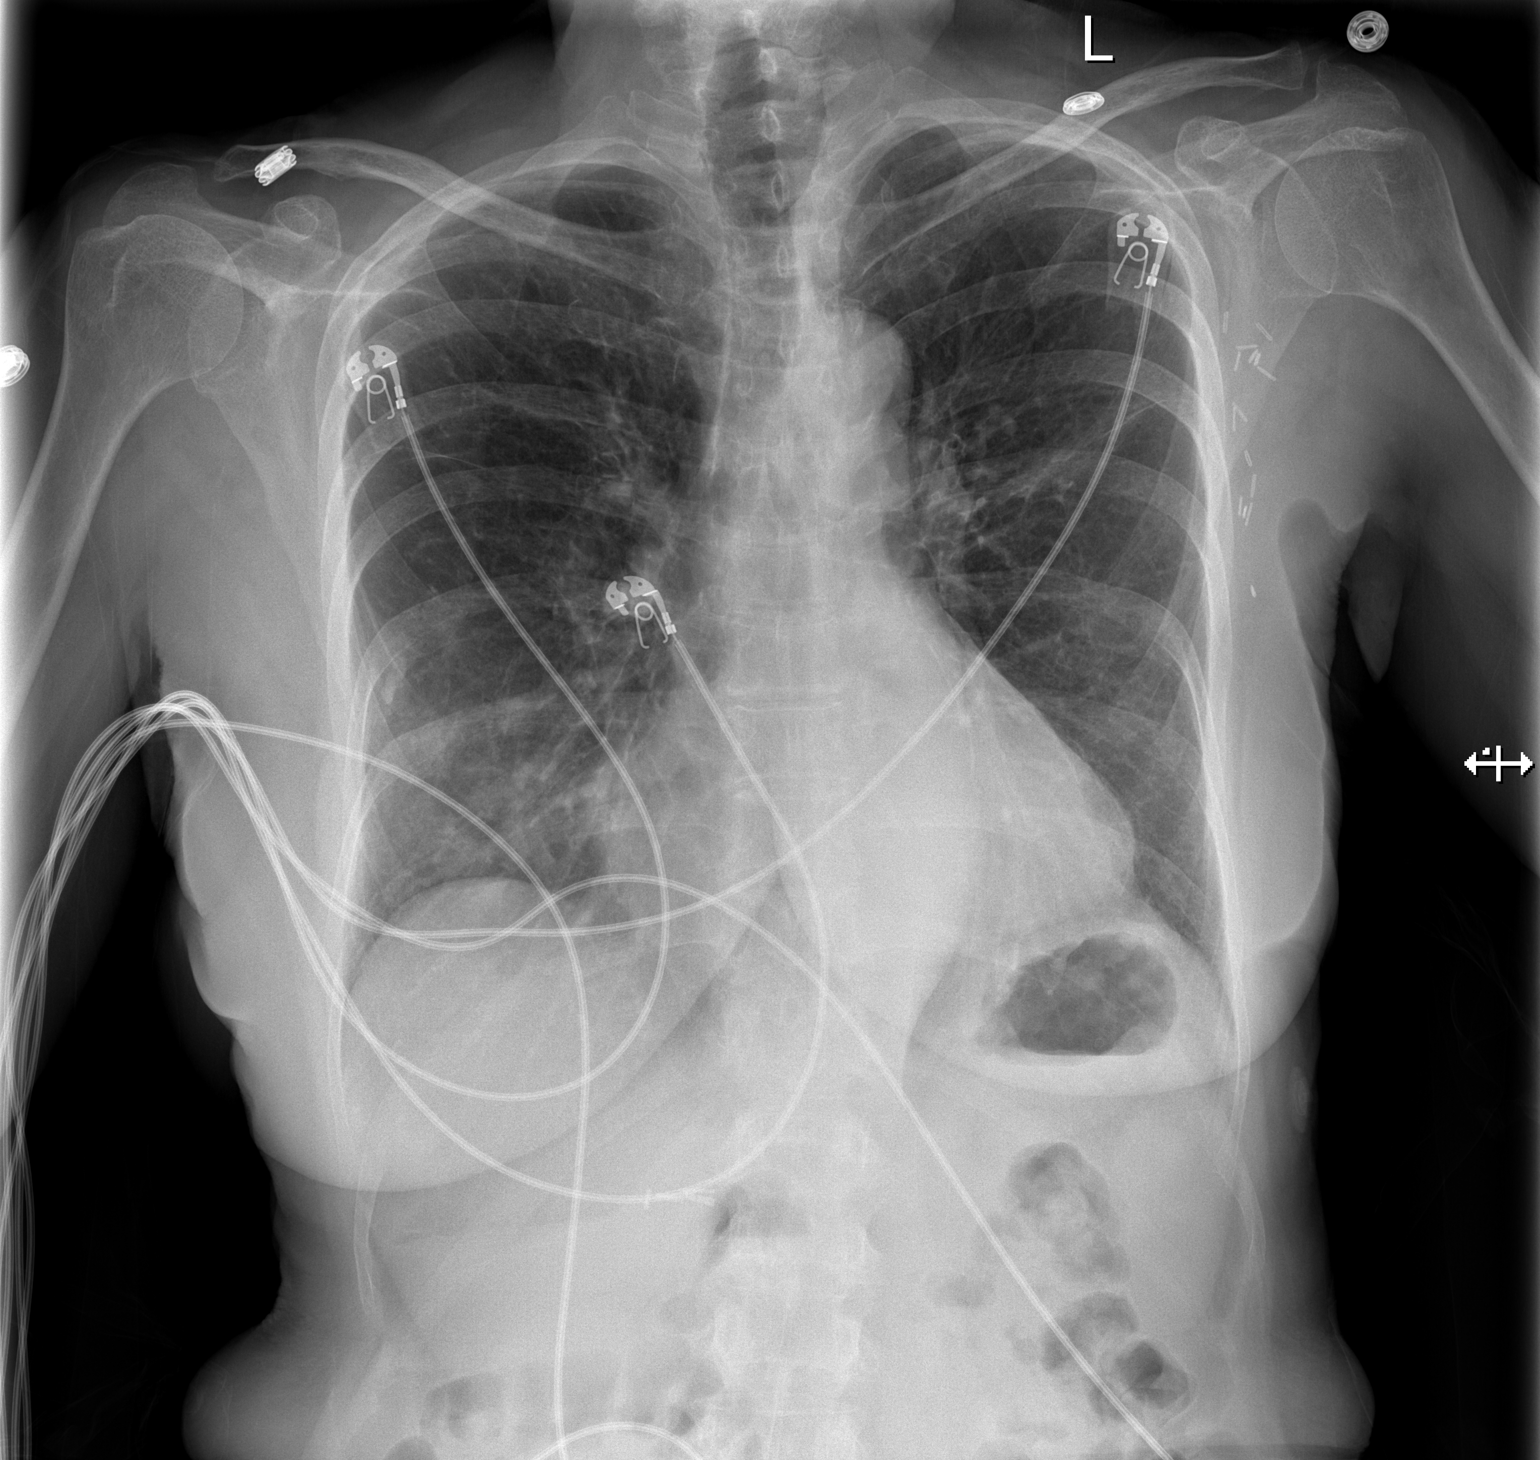

[w chest lat]
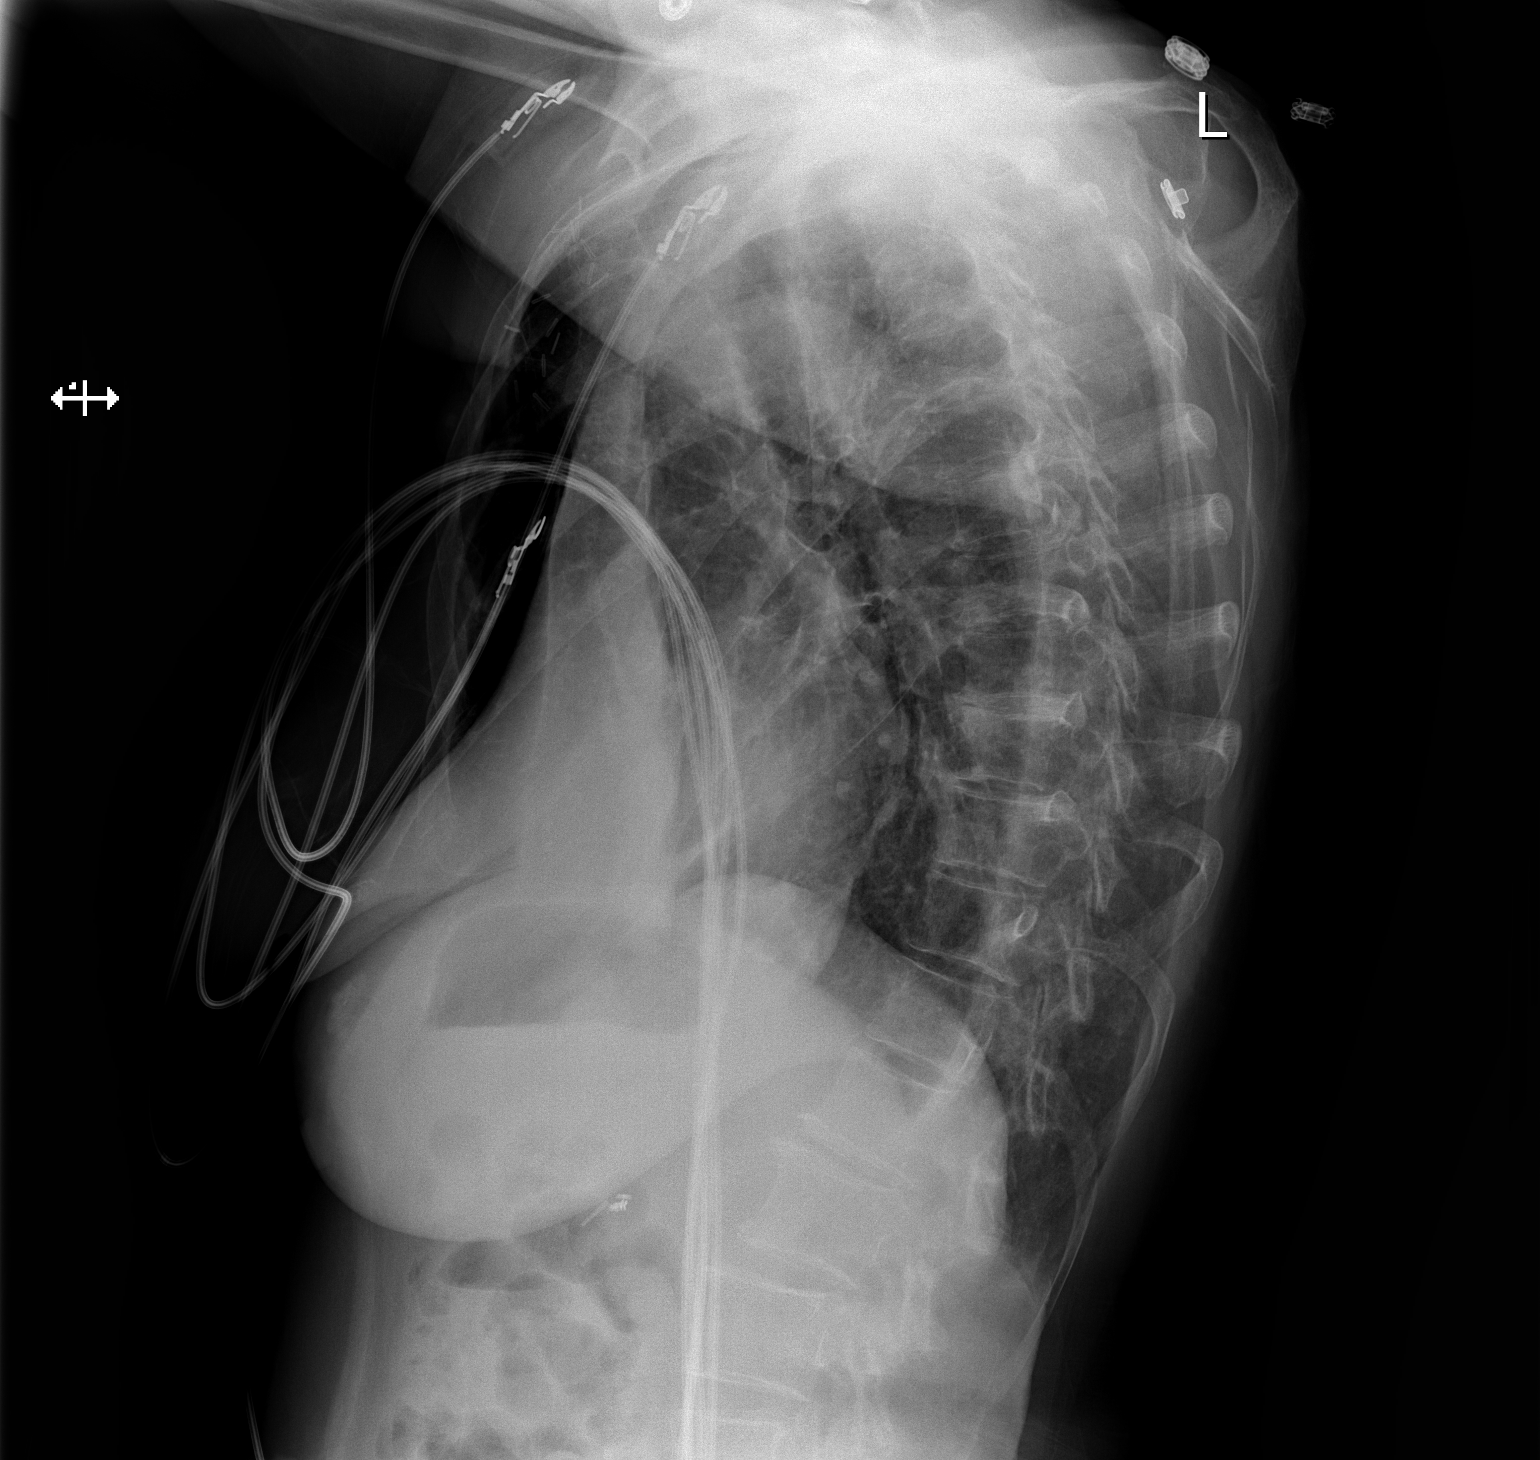

[2 of 2 positions shown; findings below may reference images not displayed]

FINDINGS: The heart size and mediastinal contours are within normal limits.
There is no evidence of acute infiltrate, pleural effusion or
pneumothorax. Radiopaque surgical clips are seen along the lateral
aspect of the mid to upper left chest wall. Surgical clips are also
seen within the right upper quadrant. The visualized skeletal
structures are unremarkable.
IMPRESSION: No active cardiopulmonary disease.

## 2023-08-19 IMAGING — CT CT ANGIO CHEST
2 of 6 series · 18 of 36 positions shown · IV contrast (agent unspecified)
Comparison: December 04, 2021 and June 18, 2019

CLINICAL DATA: Chest pain.

EXAM:
CT ANGIOGRAPHY CHEST WITH CONTRAST
TECHNIQUE: Multidetector CT imaging of the chest was performed using the
standard protocol during bolus administration of intravenous
contrast. Multiplanar CT image reconstructions and MIPs were
obtained to evaluate the vascular anatomy.

[Series 7: pe thins · axial · 0.60mm/px · z∈[+1257,+1495]mm · 17 of 378 slices shown]
[im 19/378  lung]
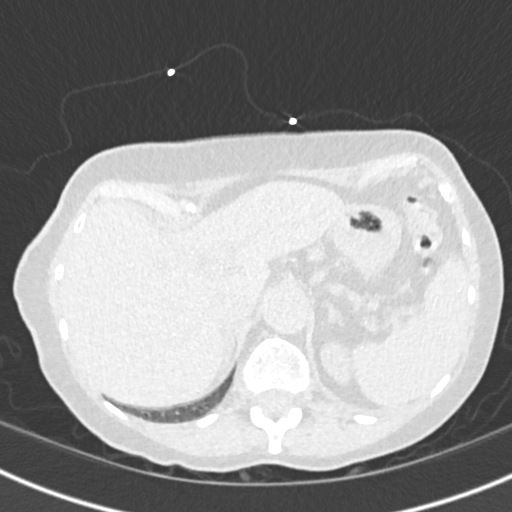
[im 38/378  mediastinal]
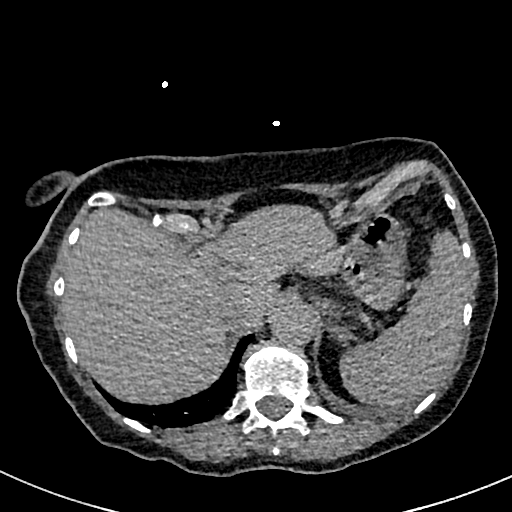
[im 57/378  lung]
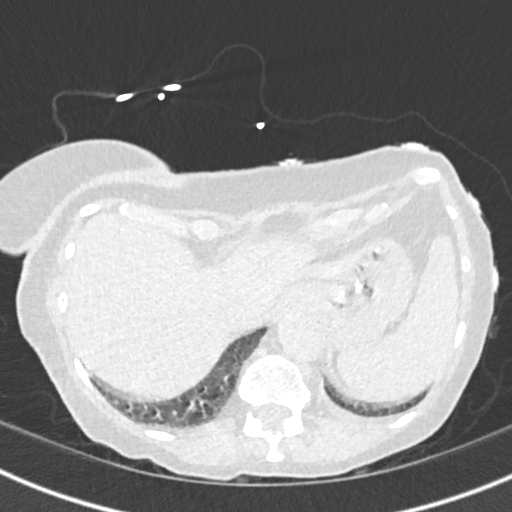
[im 76/378  mediastinal]
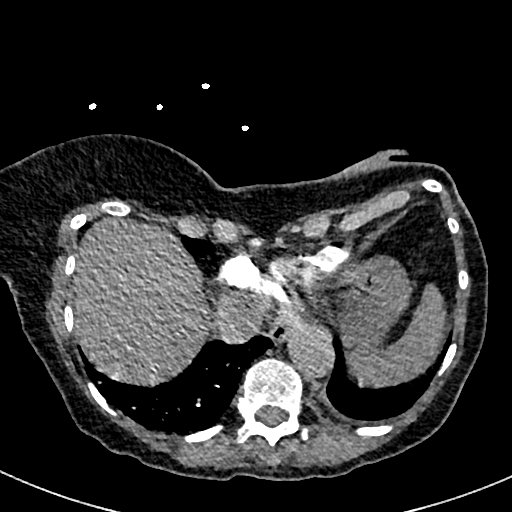
[im 114/378  lung]
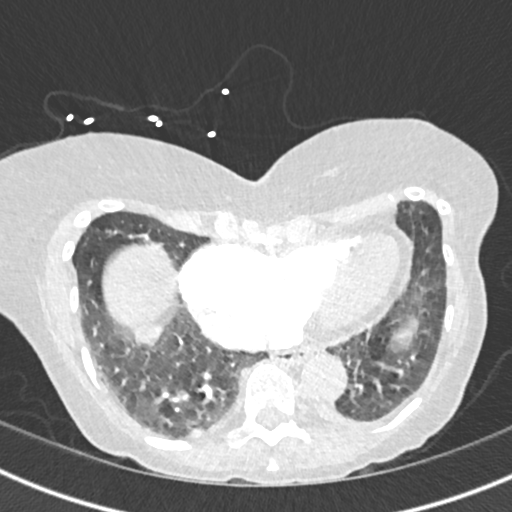
[im 132/378  mediastinal]
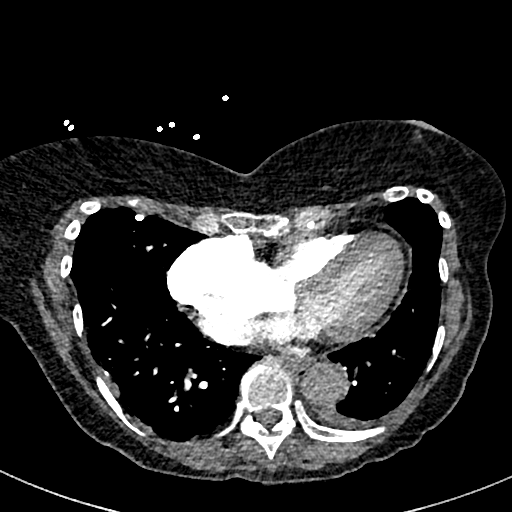
[im 151/378  lung]
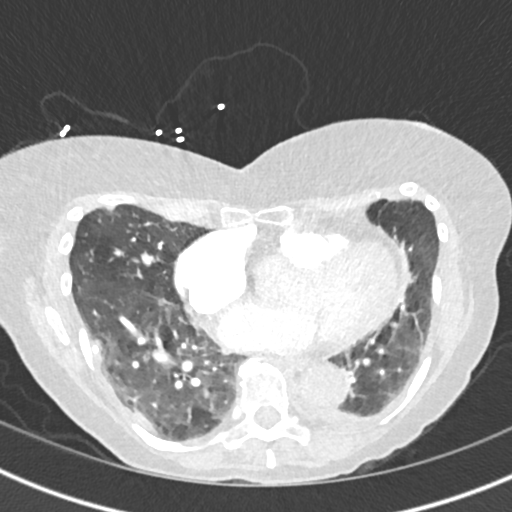
[im 170/378  mediastinal]
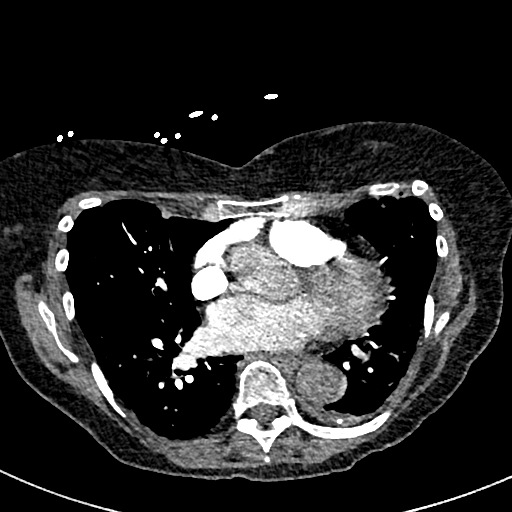
[im 189/378  lung]
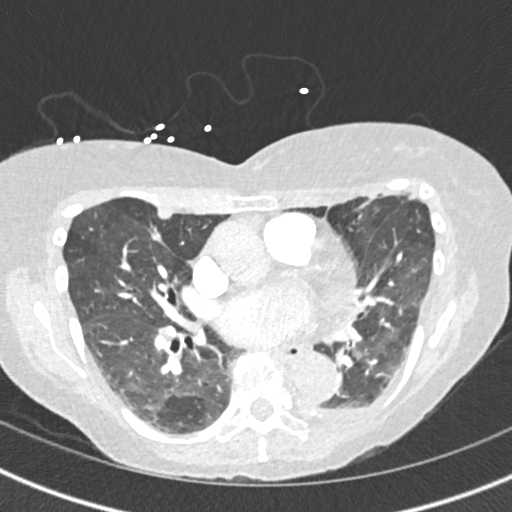
[im 208/378  mediastinal]
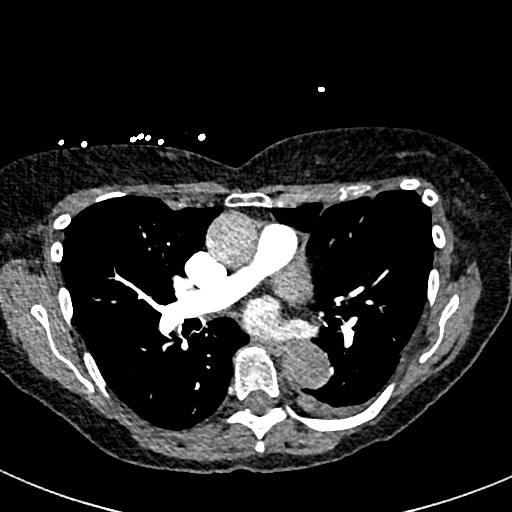
[im 227/378  lung]
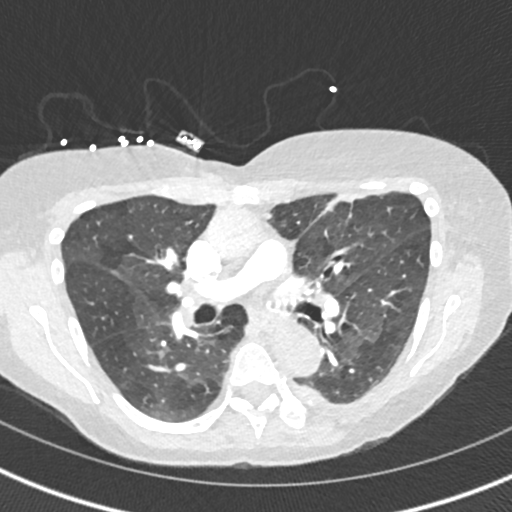
[im 246/378  mediastinal]
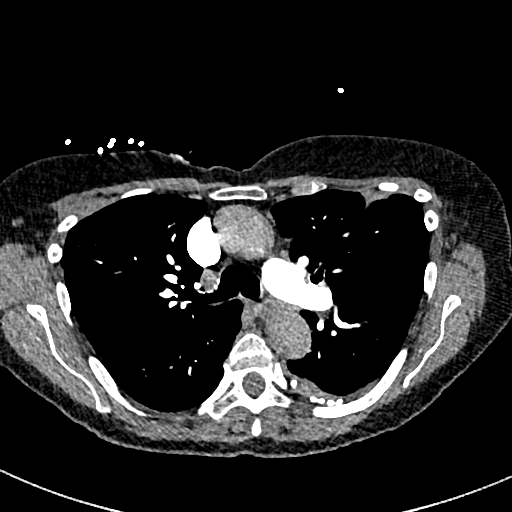
[im 264/378  lung]
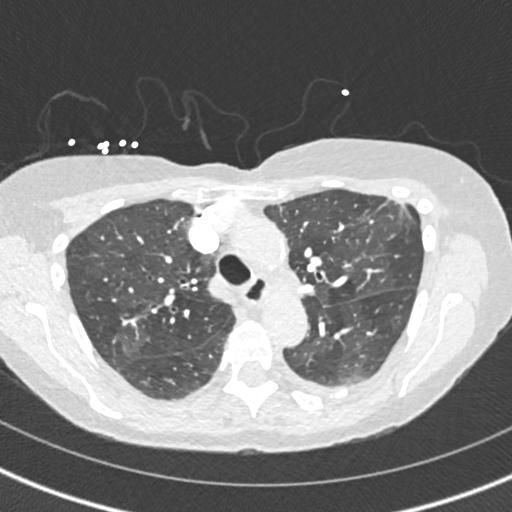
[im 302/378  mediastinal]
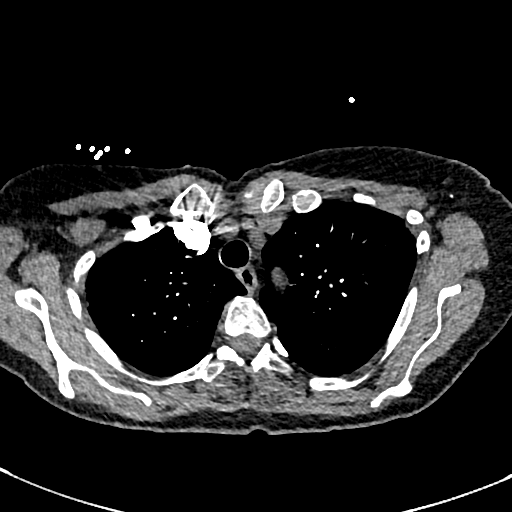
[im 321/378  lung]
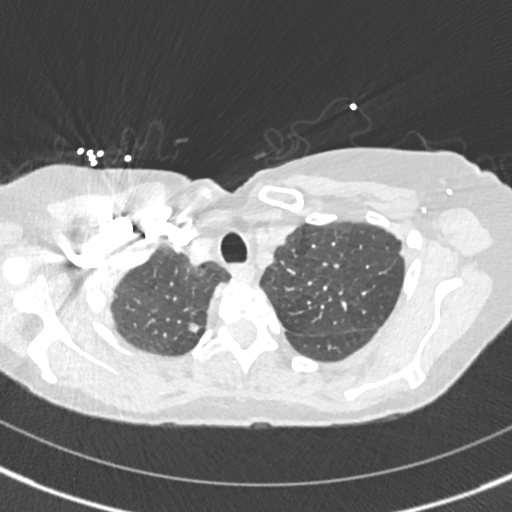
[im 340/378  mediastinal]
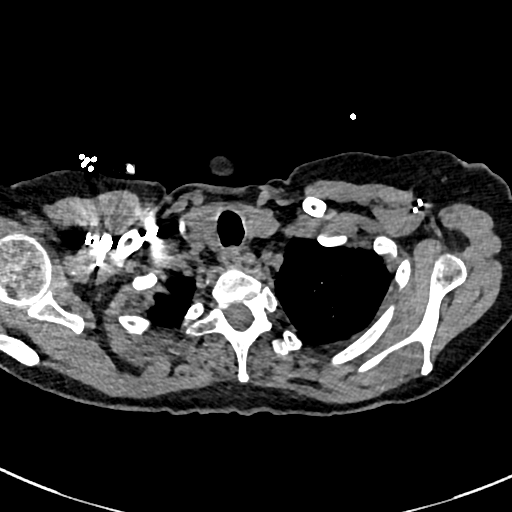
[im 359/378  lung]
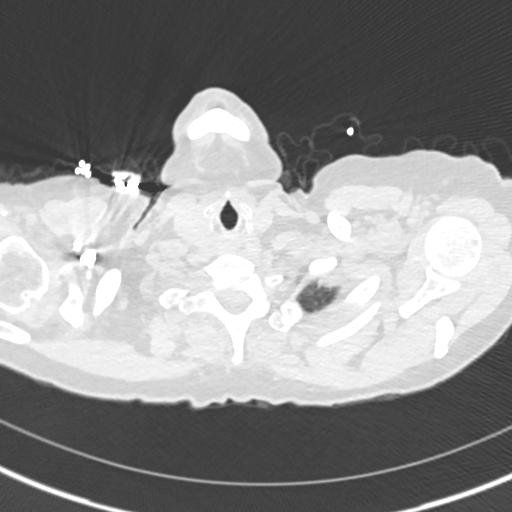

[Series 8: pe 2mm cor · coronal · 0.53mm/px · 1 of 111 slices shown]
[im 56/111  mediastinal]
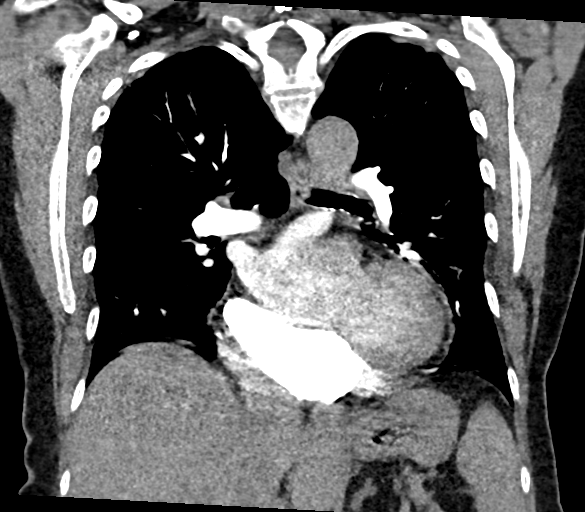

[18 of 36 positions shown; findings below may reference images not displayed]

RADIATION DOSE REDUCTION: This exam was performed according to the
departmental dose-optimization program which includes automated
exposure control, adjustment of the mA and/or kV according to
patient size and/or use of iterative reconstruction technique.

CONTRAST:  65mL OMNIPAQUE IOHEXOL 350 MG/ML SOLN
FINDINGS: Cardiovascular: Satisfactory opacification of the pulmonary arteries
to the segmental level. No evidence of pulmonary embolism. There is
mild cardiomegaly. No pericardial effusion.

Mediastinum/Nodes: No enlarged mediastinal, hilar, or axillary lymph
nodes. Thyroid gland, trachea, and esophagus demonstrate no
significant findings.

Lungs/Pleura: Mild linear scarring and/or atelectasis is seen along
the anterior aspect of the bilateral upper lobes.

A stable, likely benign 3 mm anterior right middle lobe noncalcified
lung nodule is seen (axial CT image 84, CT series 6).

Multiple stable calcified and noncalcified areas of focal pleural
thickening are seen.

There is no evidence of acute infiltrate, pleural effusion or
pneumothorax.

Upper Abdomen: No acute abnormality.

Musculoskeletal: No chest wall abnormality. No acute or significant
osseous findings.

Review of the MIP images confirms the above findings.
IMPRESSION: 1. No evidence of pulmonary embolism or other acute intrathoracic
process.
2. Multiple stable calcified and noncalcified areas of focal pleural
thickening. This may represent sequelae associated with prior
asbestos exposure.

## 2023-08-21 IMAGING — CT CT HEART MORP W/ CTA COR W/ SCORE W/ CA W/CM &/OR W/O CM
4 of 7 series · 8 of 20 positions shown, 9 images · IV contrast (APPLIED)
Comparison: Chest CTA 12/30/2021.
COMPARISON: Chest CTA 12/30/2021.

Addendum:
EXAM:
OVER-READ INTERPRETATION  CT CHEST

The following report is a limited chest CT over-read performed by
01/01/2022. This over-read does not include interpretation of cardiac
or coronary anatomy or pathology. The coronary calcium score and
cardiac CTA interpretation by the cardiologist is attached.
CLINICAL DATA: 80 yo female with dyspnea
Cardiac/Coronary CTA
TECHNIQUE: A non-contrast, gated CT scan was obtained with axial slices of 3 mm
through the heart for calcium scoring. Calcium scoring was performed
using the Agatston method. A 120 kV prospective, gated, contrast
cardiac scan was obtained. Gantry rotation speed was 250 msecs and
collimation was 0.6 mm. Two sublingual nitroglycerin tablets (0.8
mg) were given. The 3D data set was reconstructed in 5% intervals of
the 35-75% of the R-R cycle. Diastolic phases were analyzed on a
dedicated workstation using MPR, MIP, and VRT modes. The patient
received 95 cc of contrast.

[Series 6: best diast · axial · 0.39mm/px · z∈[+1154,+1192]mm · 2 of 286 slices shown, 3 images]
[im 96/286  vessel]
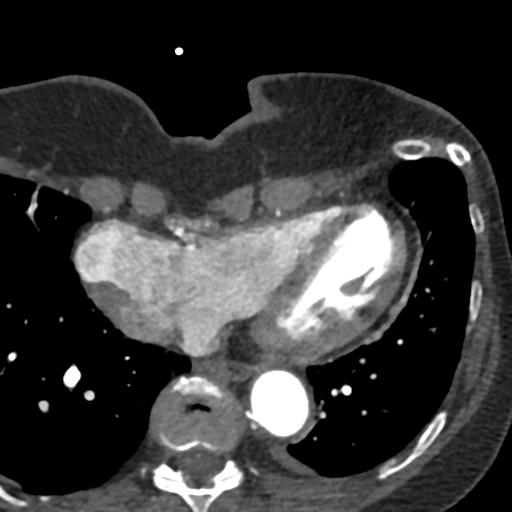
[im 96/286  lung]
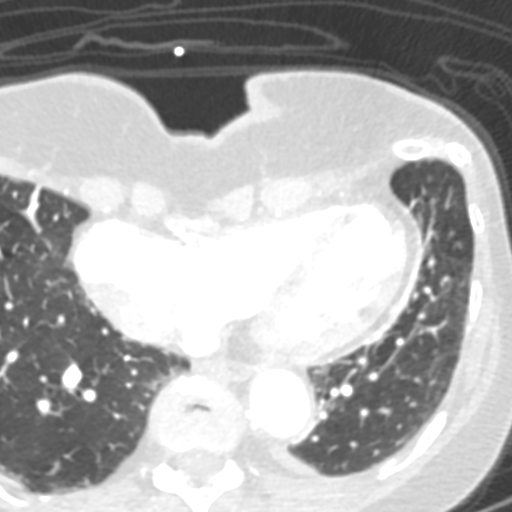
[im 191/286  vessel]
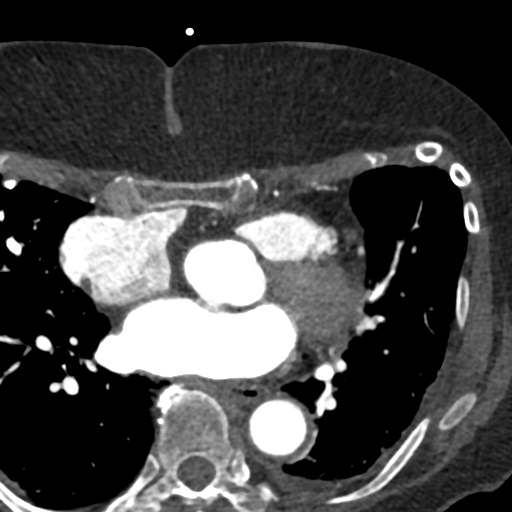

[Series 7: best syst · axial · 0.39mm/px · z∈[+1154,+1192]mm · 2 of 286 slices shown]
[im 96/286  vessel]
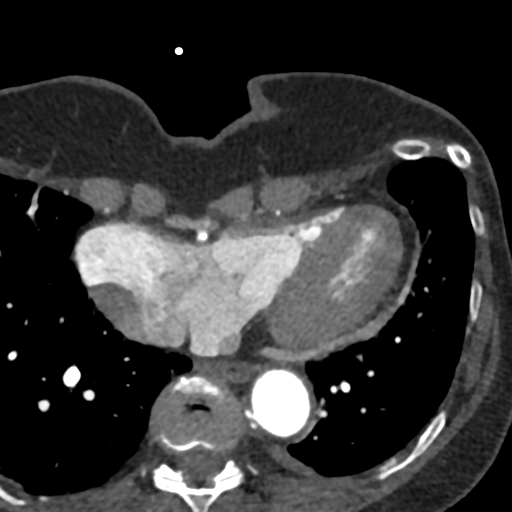
[im 191/286  vessel]
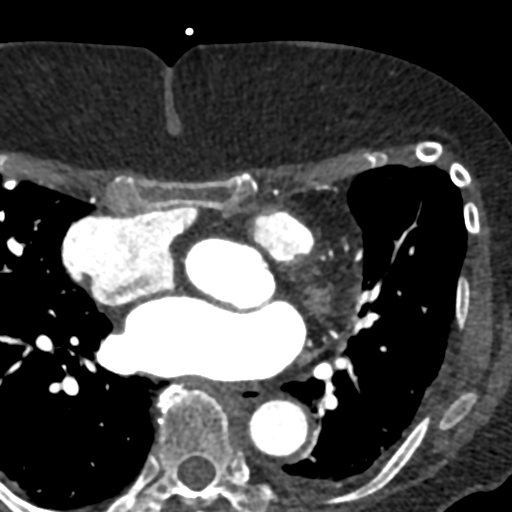

[Series 8: ts diast sharp · axial · 0.39mm/px · z∈[+1154,+1192]mm · 2 of 286 slices shown]
[im 96/286  lung]
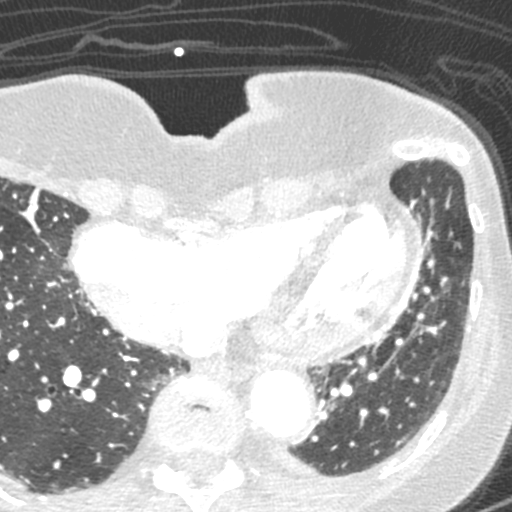
[im 191/286  lung]
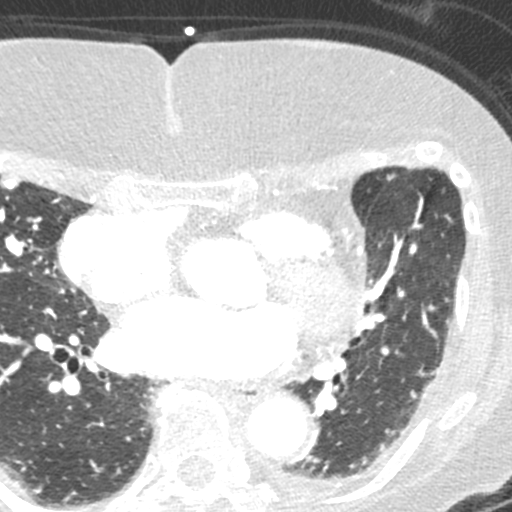

[Series 9: ts syst sharp · axial · 0.39mm/px · z∈[+1154,+1192]mm · 2 of 286 slices shown]
[im 96/286  lung]
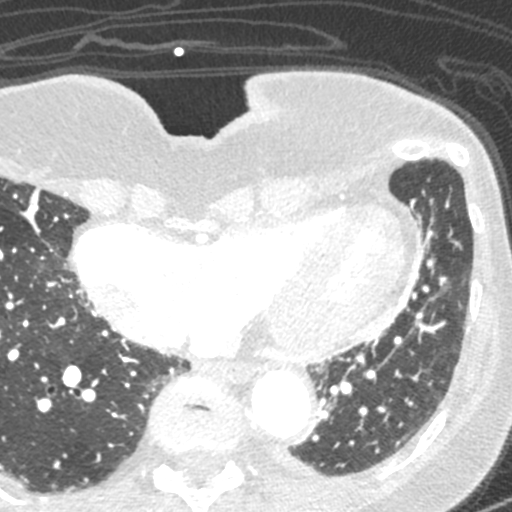
[im 191/286  lung]
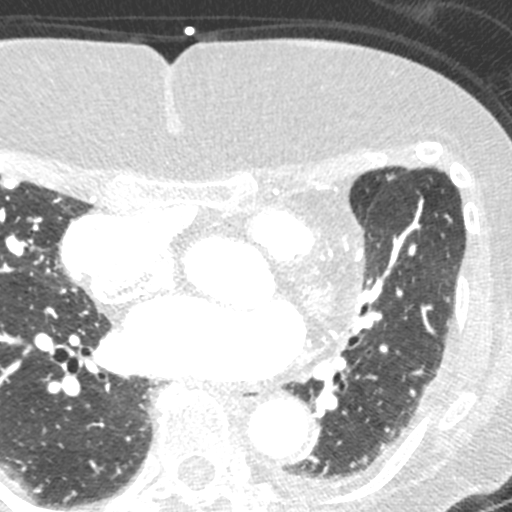

[8 of 20 positions shown; findings below may reference images not displayed]

FINDINGS: Atherosclerotic calcifications in the thoracic aorta. Numerous
calcified pleural plaques in the thorax bilaterally again noted,
indicative of asbestos related pleural disease. 3 mm right middle
lobe pulmonary nodule (axial image 33 of series 11), stable compared
to prior study from 06/18/2019, considered benign (no follow-up
imaging is recommended). Within the visualized portions of the
thorax there are no other suspicious appearing pulmonary nodules or
masses, there is no acute consolidative airspace disease, no pleural
effusions, no pneumothorax and no lymphadenopathy. Visualized
portions of the upper abdomen are unremarkable. There are no
aggressive appearing lytic or blastic lesions noted in the
visualized portions of the skeleton. Pectus excavatum.
IMPRESSION: 1. Asbestos related pleural disease again noted.
2. Aortic atherosclerosis.
3. Pectus excavatum.
FINDINGS: Image quality: Average.

Noise artifact is: Moderate. (signal-to-noise (mis-registration).

Coronary Arteries:  Normal coronary origin.  Right dominance.

Left main: The left main is a large caliber vessel with a normal
take off from the left coronary cusp that bifurcates to form a left
anterior descending artery and a left circumflex artery. There is no
plaque or stenosis.

Left anterior descending artery: The LAD has minimal (0-24)
calcified plaque in the proximal vessel. The LAD gives off small D1
and large branching D2 with no evidence of plaque.

Left circumflex artery: The LCX is non-dominant and patent with no
evidence of plaque or stenosis (small distally). The LCX gives off 1
patent obtuse marginal branch.

Right coronary artery: The RCA is dominant with normal take off from
the right coronary cusp. There is minimal (0-24) plaque in the mid
vessel. The RCA terminates as a PDA and 2 right posterolateral
branches without evidence of plaque or stenosis.

Right Atrium: Right atrial size is within normal limits.

Right Ventricle: The right ventricular cavity is within normal
limits (compressed by pectus excavatum).

Left Atrium: Left atrial size is normal in size with no left atrial
appendage filling defect.

Left Ventricle: The ventricular cavity size is within normal limits.
There are no stigmata of prior infarction. There is no abnormal
filling defect.

Pulmonary arteries: Normal in size without proximal filling defect.

Pulmonary veins: Normal pulmonary venous drainage.

Pericardium: Normal thickness with no significant effusion or
calcium present.

Cardiac valves: The aortic valve is trileaflet without significant
calcification. The mitral valve is normal structure without
significant calcification.

Aorta: Normal caliber with no significant disease.

Extra-cardiac findings: See attached radiology report for
non-cardiac structures.
IMPRESSION: 1. Coronary calcium score of 22.4. This was 29 percentile for age-,
sex, and race-matched controls.

2. Normal coronary origin with right dominance.

3. Minimal plaque in the LAD and RCA.

RECOMMENDATIONS:
CAD-RADS 1: Minimal non-obstructive CAD (0-24%). Consider
non-atherosclerotic causes of chest pain. Consider preventive
therapy and risk factor modification.

*** End of Addendum ***
EXAM:
OVER-READ INTERPRETATION  CT CHEST

The following report is a limited chest CT over-read performed by
01/01/2022. This over-read does not include interpretation of cardiac
or coronary anatomy or pathology. The coronary calcium score and
cardiac CTA interpretation by the cardiologist is attached.
FINDINGS: Atherosclerotic calcifications in the thoracic aorta. Numerous
calcified pleural plaques in the thorax bilaterally again noted,
indicative of asbestos related pleural disease. 3 mm right middle
lobe pulmonary nodule (axial image 33 of series 11), stable compared
to prior study from 06/18/2019, considered benign (no follow-up
imaging is recommended). Within the visualized portions of the
thorax there are no other suspicious appearing pulmonary nodules or
masses, there is no acute consolidative airspace disease, no pleural
effusions, no pneumothorax and no lymphadenopathy. Visualized
portions of the upper abdomen are unremarkable. There are no
aggressive appearing lytic or blastic lesions noted in the
visualized portions of the skeleton. Pectus excavatum.
IMPRESSION: 1. Asbestos related pleural disease again noted.
2. Aortic atherosclerosis.
3. Pectus excavatum.

## 2023-08-26 ENCOUNTER — Encounter (HOSPITAL_BASED_OUTPATIENT_CLINIC_OR_DEPARTMENT_OTHER): Admission: RE | Payer: Self-pay | Source: Home / Self Care

## 2023-08-26 ENCOUNTER — Ambulatory Visit (HOSPITAL_BASED_OUTPATIENT_CLINIC_OR_DEPARTMENT_OTHER): Admission: RE | Admit: 2023-08-26 | Payer: Medicare HMO | Source: Home / Self Care | Admitting: Orthopaedic Surgery

## 2023-08-26 SURGERY — GLUTEUS MINIMUS REPAIR
Anesthesia: General | Laterality: Left

## 2023-08-28 ENCOUNTER — Other Ambulatory Visit (HOSPITAL_BASED_OUTPATIENT_CLINIC_OR_DEPARTMENT_OTHER): Payer: Self-pay | Admitting: Orthopaedic Surgery

## 2023-08-28 DIAGNOSIS — M67952 Unspecified disorder of synovium and tendon, left thigh: Secondary | ICD-10-CM

## 2023-08-29 ENCOUNTER — Ambulatory Visit (HOSPITAL_BASED_OUTPATIENT_CLINIC_OR_DEPARTMENT_OTHER): Payer: Medicare HMO | Admitting: Physical Therapy

## 2023-09-01 ENCOUNTER — Encounter (HOSPITAL_BASED_OUTPATIENT_CLINIC_OR_DEPARTMENT_OTHER): Payer: Self-pay | Admitting: Orthopaedic Surgery

## 2023-09-01 ENCOUNTER — Ambulatory Visit (HOSPITAL_BASED_OUTPATIENT_CLINIC_OR_DEPARTMENT_OTHER): Payer: Medicare HMO | Admitting: Orthopaedic Surgery

## 2023-09-03 ENCOUNTER — Encounter (HOSPITAL_BASED_OUTPATIENT_CLINIC_OR_DEPARTMENT_OTHER)
Admission: RE | Admit: 2023-09-03 | Discharge: 2023-09-03 | Disposition: A | Discharge status: Home / Self Care | Payer: PPO | Source: Ambulatory Visit | Attending: Orthopaedic Surgery | Admitting: Orthopaedic Surgery

## 2023-09-03 DIAGNOSIS — Z01812 Encounter for preprocedural laboratory examination: Secondary | ICD-10-CM | POA: Insufficient documentation

## 2023-09-04 ENCOUNTER — Telehealth: Payer: Self-pay | Admitting: Orthopaedic Surgery

## 2023-09-04 NOTE — Telephone Encounter (Signed)
Patient left a message stating she is now having left side pain that goes down to her left knee. Also, having a burning sensation on left side. Patient wanted to know if she needs to schedule an appointment to discuss this with doctor. Please advise patient.

## 2023-09-04 NOTE — Telephone Encounter (Signed)
LMOM for pt to CB to schedule an appointment 1/17 or 1/20 for contralateral side prior to surgery

## 2023-09-04 NOTE — Telephone Encounter (Signed)
Spoke to pt and scheduled her 1/17 with Dr. Steward Drone to discuss these concerns as well as pre op questions

## 2023-09-05 ENCOUNTER — Ambulatory Visit (HOSPITAL_BASED_OUTPATIENT_CLINIC_OR_DEPARTMENT_OTHER): Payer: Self-pay | Admitting: Orthopaedic Surgery

## 2023-09-05 ENCOUNTER — Encounter (HOSPITAL_BASED_OUTPATIENT_CLINIC_OR_DEPARTMENT_OTHER): Payer: Medicare HMO | Admitting: Physical Therapy

## 2023-09-05 ENCOUNTER — Ambulatory Visit (HOSPITAL_BASED_OUTPATIENT_CLINIC_OR_DEPARTMENT_OTHER): Payer: PPO | Admitting: Orthopaedic Surgery

## 2023-09-05 DIAGNOSIS — M67952 Unspecified disorder of synovium and tendon, left thigh: Secondary | ICD-10-CM | POA: Diagnosis not present

## 2023-09-05 NOTE — Progress Notes (Signed)
Chief Complaint: left hip gluteus tearing     History of Present Illness:   09/05/2023: Presents today for follow-up of the left hip as she has had additional questions prior to her surgery  Emily [name] is a 84 y.o. female presents today with ongoing left hip pain.  She has been having an extremely difficult time with strength on the side and has had somewhat of a Trendelenburg gait.  She is being seen by Dr. Denyse Amass.  She is having a very difficult time going up and down stairs.  She did have an injection in January that gave her temporary relief.  She did do physical therapy as well and has been strengthening the left hip.  She has been having some limited results although is still having difficulties with balance and most activities of daily living.  The pain is predominantly lateral.  She does experience some radiating symptoms into the gluteus on the left.  She is here today with her husband who is a [place] veteran.    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   L s/p lumpectomy, chemo and XRT   Cervicalgia    Cholelithiasis    DIVERTICULOSIS, COLON    ENDOMETRIOSIS    Heart palpitations    HYPOTHYROIDISM    Left wrist fracture 11/06/2016   10/2016 - fell of high stool   Osteoporosis    Palpitations    Personal history of chemotherapy    Personal history of radiation therapy    Pleural plaque without asbestos    CT chest 10/2010: R>L lobes, upper> lower   Renal disorder    Squamous cell carcinoma    History of BC   Past Surgical History:  Procedure Laterality Date   BREAST LUMPECTOMY  2001   left   CATARACT EXTRACTION Bilateral 09/2014   both eyes   CHOLECYSTECTOMY  2010   ENDOMETRIAL ABLATION     EXCISION / BIOPSY BREAST / NIPPLE / DUCT  Left 2001   Social History   Socioeconomic History   Marital status: Married    Spouse name: Not on file   Number of children: 2   Years of education: Not on file   Highest education level: Not on file  Occupational History   Occupation: Retired    Comment: Advertising account executive  Tobacco Use   Smoking status: Never   Smokeless tobacco: Never  Vaping Use   Vaping status: Never Used  Substance and Sexual Activity   Alcohol use: Yes    Comment: 1 drink every 2 months   Drug use: No   Sexual activity: Not Currently    Birth control/protection: Post-menopausal  Other Topics Concern   Not on file  Social History Narrative   Married, lives in [place] area since 2008 from [place] to be close to dtr   Social Drivers of [employer] Strain: Low Risk  (02/06/2023)   Overall Financial Resource Strain (CARDIA)    Difficulty of Paying Living Expenses: Not hard at all  Food Insecurity: No Food Insecurity (02/06/2023)   Hunger Vital Sign    Worried About Running  Out of Food in the Last Year: Never true    Ran Out of Food in the Last Year: Never true  Transportation Needs: No Transportation Needs (02/06/2023)   PRAPARE - Administrator, Civil Service (Medical): No    Lack of Transportation (Non-Medical): No  Physical Activity: Sufficiently Active (02/06/2023)   Exercise Vital Sign    Days of Exercise per Week: 5 days    Minutes of Exercise per Session: 30 min  Stress: No Stress Concern Present (02/06/2023)   [employer] of Occupational Health - Occupational Stress Questionnaire    Feeling of Stress : Not at all  Social Connections: Socially Integrated (02/06/2023)   Social Connection and Isolation Panel [NHANES]    Frequency of Communication with Friends and Family: More than three times a week    Frequency of Social Gatherings with Friends and Family: More than three times a week    Attends Religious Services: More than 4 times per year    Active Member of [employer]  or Organizations: Yes    Attends Engineer, structural: More than 4 times per year    Marital Status: Married   Family History  Problem Relation Age of Onset   Dementia Mother    Heart disease Father    Emphysema Father        smoked and worked with asbestos   Colon cancer Neg Hx    Esophageal cancer Neg Hx    Rectal cancer Neg Hx    Migraines Neg Hx    Headache Neg Hx    Allergies  Allergen Reactions   Covid-19 (Mrna) Vaccine Anaphylaxis   Molnupiravir Other (See Comments)    Tongue swelling   Allegra [Fexofenadine]     Didn't agree with Pt    Levaquin [Levofloxacin] Swelling and Other (See Comments)    Hallucinations, swelling of throat   Nexium [Esomeprazole Magnesium]     diarrhea   Valerian Root Other (See Comments)    Loose stools   Current Outpatient Medications  Medication Sig Dispense Refill   atenolol (TENORMIN) 25 MG tablet TAKE 1 TABLET BY MOUTH EVERY DAY 90 tablet 2   calcium carbonate (OS-CAL) 1250 (500 Ca) MG chewable tablet Chew 1 tablet by mouth daily.     cetirizine-pseudoephedrine (ZYRTEC-D) 5-120 MG tablet Take 1 tablet by mouth 2 (two) times daily as needed for allergies.     chlorpheniramine (CHLOR-TRIMETON) 4 MG tablet Take 4 mg by mouth 2 (two) times daily as needed for allergies.     cholecalciferol (VITAMIN D) 25 MCG (1000 UNIT) tablet Take 1,000 Units by mouth daily.     EPINEPHrine 0.3 mg/0.3 mL IJ SOAJ injection Inject 0.3 mg into the muscle as needed for anaphylaxis. 1 each 0   gabapentin (NEURONTIN) 100 MG capsule TAKE 1 CAPSULE (100 MG TOTAL) BY MOUTH AT BEDTIME. TAKE 1 TO 3 CAPSULES AT BEDTIME 30 capsule 3   levothyroxine (SYNTHROID) 75 MCG tablet TAKE 1 TABLET BY MOUTH EVERY DAY 90 tablet 3   Current Facility-Administered Medications  Medication Dose Route Frequency Provider Last Rate Last Admin   [START ON 02/02/2024] denosumab (PROLIA) injection 60 mg  60 mg Subcutaneous Once Pincus Sanes, MD       No results found.  Review  of Systems:   A ROS was performed including pertinent positives and negatives as documented in the HPI.  Physical Exam :   Constitutional: NAD and appears stated age Neurological: Alert and oriented Psych: Appropriate affect  and cooperative There were no vitals taken for this visit.   Comprehensive Musculoskeletal Exam:    Inspection Right Left  Skin No atrophy or gross abnormalities appreciated No atrophy or gross abnormalities appreciated  Palpation    Tenderness None None  Crepitus None None  Range of Motion    Flexion (passive) 120 120  Extension 30 30  IR 30 30  ER 45 45  Strength    Flexion  5/5 5/5  Extension 5/5 5/5  Special Tests    FABER Negative Negative  FADIR Negative Negative  ER Lag/Capsular Insufficiency Negative Negative  Instability Negative Negative  Sacroiliac pain Negative  Negative   Instability    Generalized Laxity No No  Neurologic    sciatic, femoral, obturator nerves intact to light sensation  Vascular/Lymphatic    DP pulse 2+ 2+  Lumbar Exam    Patient has symmetric lumbar range of motion with negative pain referral to hip   Positive pain about left greater trochanter with weakness in resisted abduction, positive Trendelenburg  Imaging:   Xray (4 views left hip): Normal  MRI (left hip): Full-thickness gluteus medius tear with atrophy on T1 of the gluteus medius  I personally reviewed and interpreted the radiographs.   Assessment:   83 y.o. female with a left hip gluteus medius and minimus tear which has been quite problematic for her.  She has been doing physical therapy which has given her some improved strength although she is still persistently weak and painful particularly up and down stairs.  She is not able to lay directly on the side.  She has now had an additional injection without any relief.  Given this we did discuss the possibility of left hip gluteus medius repair.  At today's visit I discussed that given the fact that this  has been an additional 3 months since her last visit and with already significant atrophy on her T1 MRI that I do believe she would be a better candidate at this time for gluteus maximus tendon transfer.  I did discuss the risk and benefits as well as the associated rehab time  Plan :    -Plan for left hip gluteus maximus tendon transfer   After a lengthy discussion of treatment options, including risks, benefits, alternatives, complications of surgical and nonsurgical conservative options, the patient elected surgical repair.   The patient  is aware of the material risks  and complications including, but not limited to injury to adjacent structures, neurovascular injury, infection, numbness, bleeding, implant failure, thermal burns, stiffness, persistent pain, failure to heal, disease transmission from allograft, need for further surgery, dislocation, anesthetic risks, blood clots, risks of death,and others. The probabilities of surgical success and failure discussed with patient given their particular co-morbidities.The time and nature of expected rehabilitation and recovery was discussed.The patient's questions were all answered preoperatively.  No barriers to understanding were noted. I explained the natural history of the disease process and Rx rationale.  I explained to the patient what I considered to be reasonable expectations given their personal situation.  The final treatment plan was arrived at through a shared patient decision making process model.      I personally saw and evaluated the patient, and participated in the management and treatment plan.  Huel Cote, MD Attending Physician, Orthopedic Surgery  This document was dictated using Dragon voice recognition software. A reasonable attempt at proof reading has been made to minimize errors.

## 2023-09-05 NOTE — H&P (View-Only) (Signed)
Chief Complaint: left hip gluteus tearing     History of Present Illness:   09/05/2023: Presents today for follow-up of the left hip as she has had additional questions prior to her surgery  Emily [name] is a 83 y.o. female presents today with ongoing left hip pain.  She has been having an extremely difficult time with strength on the side and has had somewhat of a Trendelenburg gait.  She is being seen by Dr. Denyse Amass.  She is having a very difficult time going up and down stairs.  She did have an injection in January that gave her temporary relief.  She did do physical therapy as well and has been strengthening the left hip.  She has been having some limited results although is still having difficulties with balance and most activities of daily living.  The pain is predominantly lateral.  She does experience some radiating symptoms into the gluteus on the left.  She is here today with her husband who is a [place] veteran.    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   L s/p lumpectomy, chemo and XRT   Cervicalgia    Cholelithiasis    DIVERTICULOSIS, COLON    ENDOMETRIOSIS    Heart palpitations    HYPOTHYROIDISM    Left wrist fracture 11/06/2016   10/2016 - fell of high stool   Osteoporosis    Palpitations    Personal history of chemotherapy    Personal history of radiation therapy    Pleural plaque without asbestos    CT chest 10/2010: R>L lobes, upper> lower   Renal disorder    Squamous cell carcinoma    History of BC   Past Surgical History:  Procedure Laterality Date   BREAST LUMPECTOMY  2001   left   CATARACT EXTRACTION Bilateral 09/2014   both eyes   CHOLECYSTECTOMY  2010   ENDOMETRIAL ABLATION     EXCISION / BIOPSY BREAST / NIPPLE / DUCT  Left 2001   Social History   Socioeconomic History   Marital status: Married    Spouse name: Not on file   Number of children: 2   Years of education: Not on file   Highest education level: Not on file  Occupational History   Occupation: Retired    Comment: Advertising account executive  Tobacco Use   Smoking status: Never   Smokeless tobacco: Never  Vaping Use   Vaping status: Never Used  Substance and Sexual Activity   Alcohol use: Yes    Comment: 1 drink every 2 months   Drug use: No   Sexual activity: Not Currently    Birth control/protection: Post-menopausal  Other Topics Concern   Not on file  Social History Narrative   Married, lives in [place] area since 2008 from [place] to be close to dtr   Social Drivers of [employer] Strain: Low Risk  (02/06/2023)   Overall Financial Resource Strain (CARDIA)    Difficulty of Paying Living Expenses: Not hard at all  Food Insecurity: No Food Insecurity (02/06/2023)   Hunger Vital Sign    Worried About Running  Out of Food in the Last Year: Never true    Ran Out of Food in the Last Year: Never true  Transportation Needs: No Transportation Needs (02/06/2023)   PRAPARE - Administrator, Civil Service (Medical): No    Lack of Transportation (Non-Medical): No  Physical Activity: Sufficiently Active (02/06/2023)   Exercise Vital Sign    Days of Exercise per Week: 5 days    Minutes of Exercise per Session: 30 min  Stress: No Stress Concern Present (02/06/2023)   [employer] of Occupational Health - Occupational Stress Questionnaire    Feeling of Stress : Not at all  Social Connections: Socially Integrated (02/06/2023)   Social Connection and Isolation Panel [NHANES]    Frequency of Communication with Friends and Family: More than three times a week    Frequency of Social Gatherings with Friends and Family: More than three times a week    Attends Religious Services: More than 4 times per year    Active Member of [employer]  or Organizations: Yes    Attends Engineer, structural: More than 4 times per year    Marital Status: Married   Family History  Problem Relation Age of Onset   Dementia Mother    Heart disease Father    Emphysema Father        smoked and worked with asbestos   Colon cancer Neg Hx    Esophageal cancer Neg Hx    Rectal cancer Neg Hx    Migraines Neg Hx    Headache Neg Hx    Allergies  Allergen Reactions   Covid-19 (Mrna) Vaccine Anaphylaxis   Molnupiravir Other (See Comments)    Tongue swelling   Allegra [Fexofenadine]     Didn't agree with Pt    Levaquin [Levofloxacin] Swelling and Other (See Comments)    Hallucinations, swelling of throat   Nexium [Esomeprazole Magnesium]     diarrhea   Valerian Root Other (See Comments)    Loose stools   Current Outpatient Medications  Medication Sig Dispense Refill   atenolol (TENORMIN) 25 MG tablet TAKE 1 TABLET BY MOUTH EVERY DAY 90 tablet 2   calcium carbonate (OS-CAL) 1250 (500 Ca) MG chewable tablet Chew 1 tablet by mouth daily.     cetirizine-pseudoephedrine (ZYRTEC-D) 5-120 MG tablet Take 1 tablet by mouth 2 (two) times daily as needed for allergies.     chlorpheniramine (CHLOR-TRIMETON) 4 MG tablet Take 4 mg by mouth 2 (two) times daily as needed for allergies.     cholecalciferol (VITAMIN D) 25 MCG (1000 UNIT) tablet Take 1,000 Units by mouth daily.     EPINEPHrine 0.3 mg/0.3 mL IJ SOAJ injection Inject 0.3 mg into the muscle as needed for anaphylaxis. 1 each 0   gabapentin (NEURONTIN) 100 MG capsule TAKE 1 CAPSULE (100 MG TOTAL) BY MOUTH AT BEDTIME. TAKE 1 TO 3 CAPSULES AT BEDTIME 30 capsule 3   levothyroxine (SYNTHROID) 75 MCG tablet TAKE 1 TABLET BY MOUTH EVERY DAY 90 tablet 3   Current Facility-Administered Medications  Medication Dose Route Frequency Provider Last Rate Last Admin   [START ON 02/02/2024] denosumab (PROLIA) injection 60 mg  60 mg Subcutaneous Once Pincus Sanes, MD       No results found.  Review  of Systems:   A ROS was performed including pertinent positives and negatives as documented in the HPI.  Physical Exam :   Constitutional: NAD and appears stated age Neurological: Alert and oriented Psych: Appropriate affect  and cooperative There were no vitals taken for this visit.   Comprehensive Musculoskeletal Exam:    Inspection Right Left  Skin No atrophy or gross abnormalities appreciated No atrophy or gross abnormalities appreciated  Palpation    Tenderness None None  Crepitus None None  Range of Motion    Flexion (passive) 120 120  Extension 30 30  IR 30 30  ER 45 45  Strength    Flexion  5/5 5/5  Extension 5/5 5/5  Special Tests    FABER Negative Negative  FADIR Negative Negative  ER Lag/Capsular Insufficiency Negative Negative  Instability Negative Negative  Sacroiliac pain Negative  Negative   Instability    Generalized Laxity No No  Neurologic    sciatic, femoral, obturator nerves intact to light sensation  Vascular/Lymphatic    DP pulse 2+ 2+  Lumbar Exam    Patient has symmetric lumbar range of motion with negative pain referral to hip   Positive pain about left greater trochanter with weakness in resisted abduction, positive Trendelenburg  Imaging:   Xray (4 views left hip): Normal  MRI (left hip): Full-thickness gluteus medius tear with atrophy on T1 of the gluteus medius  I personally reviewed and interpreted the radiographs.   Assessment:   83 y.o. female with a left hip gluteus medius and minimus tear which has been quite problematic for her.  She has been doing physical therapy which has given her some improved strength although she is still persistently weak and painful particularly up and down stairs.  She is not able to lay directly on the side.  She has now had an additional injection without any relief.  Given this we did discuss the possibility of left hip gluteus medius repair.  At today's visit I discussed that given the fact that this  has been an additional 3 months since her last visit and with already significant atrophy on her T1 MRI that I do believe she would be a better candidate at this time for gluteus maximus tendon transfer.  I did discuss the risk and benefits as well as the associated rehab time  Plan :    -Plan for left hip gluteus maximus tendon transfer   After a lengthy discussion of treatment options, including risks, benefits, alternatives, complications of surgical and nonsurgical conservative options, the patient elected surgical repair.   The patient  is aware of the material risks  and complications including, but not limited to injury to adjacent structures, neurovascular injury, infection, numbness, bleeding, implant failure, thermal burns, stiffness, persistent pain, failure to heal, disease transmission from allograft, need for further surgery, dislocation, anesthetic risks, blood clots, risks of death,and others. The probabilities of surgical success and failure discussed with patient given their particular co-morbidities.The time and nature of expected rehabilitation and recovery was discussed.The patient's questions were all answered preoperatively.  No barriers to understanding were noted. I explained the natural history of the disease process and Rx rationale.  I explained to the patient what I considered to be reasonable expectations given their personal situation.  The final treatment plan was arrived at through a shared patient decision making process model.      I personally saw and evaluated the patient, and participated in the management and treatment plan.  Huel Cote, MD Attending Physician, Orthopedic Surgery  This document was dictated using Dragon voice recognition software. A reasonable attempt at proof reading has been made to minimize errors.

## 2023-09-08 ENCOUNTER — Encounter (HOSPITAL_BASED_OUTPATIENT_CLINIC_OR_DEPARTMENT_OTHER): Payer: Medicare HMO | Admitting: Orthopaedic Surgery

## 2023-09-09 ENCOUNTER — Ambulatory Visit (HOSPITAL_BASED_OUTPATIENT_CLINIC_OR_DEPARTMENT_OTHER): Payer: PPO | Admitting: Anesthesiology

## 2023-09-09 ENCOUNTER — Encounter (HOSPITAL_BASED_OUTPATIENT_CLINIC_OR_DEPARTMENT_OTHER)
Admission: RE | Disposition: A | Discharge status: Home / Self Care | Payer: Self-pay | Source: Home / Self Care | Attending: Orthopaedic Surgery

## 2023-09-09 ENCOUNTER — Other Ambulatory Visit: Payer: Self-pay

## 2023-09-09 ENCOUNTER — Ambulatory Visit (HOSPITAL_BASED_OUTPATIENT_CLINIC_OR_DEPARTMENT_OTHER)
Admission: RE | Admit: 2023-09-09 | Discharge: 2023-09-09 | Disposition: A | Discharge status: Home / Self Care | Payer: PPO | Attending: Orthopaedic Surgery | Admitting: Orthopaedic Surgery

## 2023-09-09 ENCOUNTER — Encounter (HOSPITAL_BASED_OUTPATIENT_CLINIC_OR_DEPARTMENT_OTHER): Payer: Self-pay | Admitting: Orthopaedic Surgery

## 2023-09-09 DIAGNOSIS — Z79890 Hormone replacement therapy (postmenopausal): Secondary | ICD-10-CM | POA: Diagnosis not present

## 2023-09-09 DIAGNOSIS — M1990 Unspecified osteoarthritis, unspecified site: Secondary | ICD-10-CM | POA: Insufficient documentation

## 2023-09-09 DIAGNOSIS — S76012A Strain of muscle, fascia and tendon of left hip, initial encounter: Secondary | ICD-10-CM | POA: Diagnosis not present

## 2023-09-09 DIAGNOSIS — M67952 Unspecified disorder of synovium and tendon, left thigh: Secondary | ICD-10-CM

## 2023-09-09 DIAGNOSIS — R519 Headache, unspecified: Secondary | ICD-10-CM | POA: Diagnosis not present

## 2023-09-09 DIAGNOSIS — X58XXXA Exposure to other specified factors, initial encounter: Secondary | ICD-10-CM | POA: Insufficient documentation

## 2023-09-09 DIAGNOSIS — E039 Hypothyroidism, unspecified: Secondary | ICD-10-CM | POA: Diagnosis not present

## 2023-09-09 DIAGNOSIS — I10 Essential (primary) hypertension: Secondary | ICD-10-CM | POA: Insufficient documentation

## 2023-09-09 DIAGNOSIS — M6258 Muscle wasting and atrophy, not elsewhere classified, other site: Secondary | ICD-10-CM | POA: Diagnosis not present

## 2023-09-09 DIAGNOSIS — Z923 Personal history of irradiation: Secondary | ICD-10-CM | POA: Diagnosis not present

## 2023-09-09 DIAGNOSIS — Z853 Personal history of malignant neoplasm of breast: Secondary | ICD-10-CM | POA: Diagnosis not present

## 2023-09-09 DIAGNOSIS — F419 Anxiety disorder, unspecified: Secondary | ICD-10-CM | POA: Diagnosis not present

## 2023-09-09 DIAGNOSIS — S76012D Strain of muscle, fascia and tendon of left hip, subsequent encounter: Secondary | ICD-10-CM

## 2023-09-09 DIAGNOSIS — Z9221 Personal history of antineoplastic chemotherapy: Secondary | ICD-10-CM | POA: Diagnosis not present

## 2023-09-09 DIAGNOSIS — Z79899 Other long term (current) drug therapy: Secondary | ICD-10-CM | POA: Diagnosis not present

## 2023-09-09 DIAGNOSIS — Z01818 Encounter for other preprocedural examination: Secondary | ICD-10-CM

## 2023-09-09 DIAGNOSIS — I517 Cardiomegaly: Secondary | ICD-10-CM | POA: Diagnosis not present

## 2023-09-09 HISTORY — DX: Palpitations: R00.2

## 2023-09-09 HISTORY — PX: GLUTEUS MINIMUS REPAIR: SHX5843

## 2023-09-09 SURGERY — REPAIR, TENDON, GLUTEUS MINIMUS
Anesthesia: General | Site: Hip | Laterality: Left

## 2023-09-09 MED ORDER — LIDOCAINE 2% (20 MG/ML) 5 ML SYRINGE
INTRAMUSCULAR | Status: DC | PRN
  Administered 2023-09-09: 60 mg via INTRAVENOUS

## 2023-09-09 MED ORDER — FENTANYL CITRATE (PF) 100 MCG/2ML IJ SOLN
INTRAMUSCULAR | Status: AC
  Filled 2023-09-09: qty 2

## 2023-09-09 MED ORDER — LIDOCAINE 2% (20 MG/ML) 5 ML SYRINGE
INTRAMUSCULAR | Status: AC
  Filled 2023-09-09: qty 5

## 2023-09-09 MED ORDER — PHENYLEPHRINE HCL-NACL 20-0.9 MG/250ML-% IV SOLN
INTRAVENOUS | Status: DC | PRN
  Administered 2023-09-09: 80 ug via INTRAVENOUS
  Administered 2023-09-09: 160 ug via INTRAVENOUS
  Administered 2023-09-09 (×2): 80 ug via INTRAVENOUS
  Administered 2023-09-09: 160 ug via INTRAVENOUS

## 2023-09-09 MED ORDER — ROCURONIUM BROMIDE 10 MG/ML (PF) SYRINGE
PREFILLED_SYRINGE | INTRAVENOUS | Status: AC
  Filled 2023-09-09: qty 10

## 2023-09-09 MED ORDER — SUGAMMADEX SODIUM 200 MG/2ML IV SOLN
INTRAVENOUS | Status: DC | PRN
  Administered 2023-09-09: 200 mg via INTRAVENOUS

## 2023-09-09 MED ORDER — ROCURONIUM BROMIDE 10 MG/ML (PF) SYRINGE
PREFILLED_SYRINGE | INTRAVENOUS | Status: DC | PRN
  Administered 2023-09-09: 40 mg via INTRAVENOUS

## 2023-09-09 MED ORDER — PROPOFOL 10 MG/ML IV BOLUS
INTRAVENOUS | Status: DC | PRN
  Administered 2023-09-09: 130 mg via INTRAVENOUS

## 2023-09-09 MED ORDER — TRAMADOL HCL 50 MG PO TABS: 50.0000 mg | ORAL_TABLET | Freq: Four times a day (QID) | ORAL | 0 refills | Status: DC | PRN

## 2023-09-09 MED ORDER — ONDANSETRON HCL 4 MG/2ML IJ SOLN
4.0000 mg | Freq: Once | INTRAMUSCULAR | Status: DC | PRN
Start: 2023-09-09 — End: 2023-09-09

## 2023-09-09 MED ORDER — EPHEDRINE 5 MG/ML INJ
INTRAVENOUS | Status: AC
Start: 2023-09-09 — End: ?
  Filled 2023-09-09: qty 5

## 2023-09-09 MED ORDER — FENTANYL CITRATE (PF) 250 MCG/5ML IJ SOLN
INTRAMUSCULAR | Status: DC | PRN
  Administered 2023-09-09 (×2): 50 ug via INTRAVENOUS

## 2023-09-09 MED ORDER — PROPOFOL 10 MG/ML IV BOLUS
INTRAVENOUS | Status: AC
  Filled 2023-09-09: qty 20

## 2023-09-09 MED ORDER — AMISULPRIDE (ANTIEMETIC) 5 MG/2ML IV SOLN
INTRAVENOUS | Status: AC
  Filled 2023-09-09: qty 4

## 2023-09-09 MED ORDER — EPHEDRINE SULFATE (PRESSORS) 50 MG/ML IJ SOLN
INTRAMUSCULAR | Status: DC | PRN
  Administered 2023-09-09: 10 mg via INTRAVENOUS
  Administered 2023-09-09 (×2): 5 mg via INTRAVENOUS

## 2023-09-09 MED ORDER — TRANEXAMIC ACID-NACL 1000-0.7 MG/100ML-% IV SOLN
INTRAVENOUS | Status: AC
  Filled 2023-09-09: qty 100

## 2023-09-09 MED ORDER — PROPOFOL 10 MG/ML IV BOLUS
INTRAVENOUS | Status: AC
Start: 2023-09-09 — End: ?
  Filled 2023-09-09: qty 20

## 2023-09-09 MED ORDER — PHENYLEPHRINE HCL (PRESSORS) 10 MG/ML IV SOLN
INTRAVENOUS | Status: DC | PRN
  Administered 2023-09-09: 100 ug via INTRAVENOUS
  Administered 2023-09-09: 160 ug via INTRAVENOUS

## 2023-09-09 MED ORDER — OXYCODONE HCL 5 MG PO TABS
5.0000 mg | ORAL_TABLET | ORAL | 0 refills | Status: DC | PRN
Start: 2023-09-09 — End: 2023-10-30

## 2023-09-09 MED ORDER — GABAPENTIN 300 MG PO CAPS: 300.0000 mg | ORAL_CAPSULE | Freq: Once | ORAL | Status: DC

## 2023-09-09 MED ORDER — FENTANYL CITRATE (PF) 100 MCG/2ML IJ SOLN
25.0000 ug | INTRAMUSCULAR | Status: DC | PRN
  Administered 2023-09-09 (×3): 50 ug via INTRAVENOUS

## 2023-09-09 MED ORDER — DEXAMETHASONE SODIUM PHOSPHATE 10 MG/ML IJ SOLN
INTRAMUSCULAR | Status: AC
  Filled 2023-09-09: qty 1

## 2023-09-09 MED ORDER — CEFAZOLIN SODIUM-DEXTROSE 2-4 GM/100ML-% IV SOLN
2.0000 g | INTRAVENOUS | Status: AC
  Administered 2023-09-09: 2 g via INTRAVENOUS

## 2023-09-09 MED ORDER — BUPIVACAINE HCL (PF) 0.25 % IJ SOLN
INTRAMUSCULAR | Status: AC
  Filled 2023-09-09: qty 120

## 2023-09-09 MED ORDER — ONDANSETRON HCL 4 MG/2ML IJ SOLN
INTRAMUSCULAR | Status: AC
  Filled 2023-09-09: qty 2

## 2023-09-09 MED ORDER — 0.9 % SODIUM CHLORIDE (POUR BTL) OPTIME
TOPICAL | Status: DC | PRN
  Administered 2023-09-09: 600 mL

## 2023-09-09 MED ORDER — LACTATED RINGERS IV SOLN: INTRAVENOUS | Status: DC | PRN

## 2023-09-09 MED ORDER — OXYCODONE HCL 5 MG/5ML PO SOLN: 5.0000 mg | Freq: Once | ORAL | Status: DC | PRN

## 2023-09-09 MED ORDER — ONDANSETRON 8 MG PO TBDP: 8.0000 mg | ORAL_TABLET | Freq: Three times a day (TID) | ORAL | 0 refills | Status: DC | PRN

## 2023-09-09 MED ORDER — CEFAZOLIN SODIUM-DEXTROSE 2-4 GM/100ML-% IV SOLN
INTRAVENOUS | Status: AC
  Filled 2023-09-09: qty 100

## 2023-09-09 MED ORDER — BUPIVACAINE HCL 0.25 % IJ SOLN
INTRAMUSCULAR | Status: DC | PRN
  Administered 2023-09-09: 20 mL

## 2023-09-09 MED ORDER — ONDANSETRON HCL 4 MG/2ML IJ SOLN
INTRAMUSCULAR | Status: DC | PRN
  Administered 2023-09-09: 4 mg via INTRAVENOUS

## 2023-09-09 MED ORDER — DEXAMETHASONE SODIUM PHOSPHATE 10 MG/ML IJ SOLN
INTRAMUSCULAR | Status: DC | PRN
  Administered 2023-09-09: 10 mg via INTRAVENOUS

## 2023-09-09 MED ORDER — ACETAMINOPHEN 500 MG PO TABS
ORAL_TABLET | ORAL | Status: AC
  Filled 2023-09-09: qty 2

## 2023-09-09 MED ORDER — ACETAMINOPHEN 500 MG PO TABS
1000.0000 mg | ORAL_TABLET | Freq: Once | ORAL | Status: AC
  Administered 2023-09-09: 1000 mg via ORAL

## 2023-09-09 MED ORDER — OXYCODONE HCL 5 MG PO TABS: 5.0000 mg | ORAL_TABLET | Freq: Once | ORAL | Status: DC | PRN

## 2023-09-09 MED ORDER — TRANEXAMIC ACID-NACL 1000-0.7 MG/100ML-% IV SOLN
1000.0000 mg | INTRAVENOUS | Status: AC
  Administered 2023-09-09: 1000 mg via INTRAVENOUS

## 2023-09-09 MED ORDER — AMISULPRIDE (ANTIEMETIC) 5 MG/2ML IV SOLN
10.0000 mg | Freq: Once | INTRAVENOUS | Status: AC
  Administered 2023-09-09: 10 mg via INTRAVENOUS

## 2023-09-09 MED ORDER — LACTATED RINGERS IV SOLN: INTRAVENOUS | Status: DC

## 2023-09-09 SURGICAL SUPPLY — 50 items
ANCHOR JUGGERKNOT SOFT 1.5 (Anchor) ×2 IMPLANT
ANCHOR JUGGERKNOT SOFT 2.9 (Anchor) IMPLANT
ANCHOR SUT QUATTRO KNTLS 4.5 (Anchor) IMPLANT
BLADE SURG 10 STRL SS (BLADE) ×1 IMPLANT
BLADE SURG 15 STRL LF DISP TIS (BLADE) ×1 IMPLANT
CANISTER SUCT 1200ML W/VALVE (MISCELLANEOUS) ×1 IMPLANT
CHLORAPREP W/TINT 26 (MISCELLANEOUS) ×1 IMPLANT
CLSR STERI-STRIP ANTIMIC 1/2X4 (GAUZE/BANDAGES/DRESSINGS) IMPLANT
COOLER ICEMAN CLASSIC (MISCELLANEOUS) ×1 IMPLANT
COVER BACK TABLE 60X90IN (DRAPES) ×1 IMPLANT
COVER MAYO STAND STRL (DRAPES) ×1 IMPLANT
DERMABOND ADVANCED .7 DNX12 (GAUZE/BANDAGES/DRESSINGS) IMPLANT
DRAPE STERI IOBAN 125X83 (DRAPES) ×1 IMPLANT
DRAPE U-SHAPE 47X51 STRL (DRAPES) ×2 IMPLANT
DRSG AQUACEL AG ADV 3.5X 6 (GAUZE/BANDAGES/DRESSINGS) ×1 IMPLANT
DRSG AQUACEL AG ADV 3.5X10 (GAUZE/BANDAGES/DRESSINGS) IMPLANT
ELECT BLADE 4.0 EZ CLEAN MEGAD (MISCELLANEOUS)
ELECT REM PT RETURN 9FT ADLT (ELECTROSURGICAL) ×1
ELECTRODE BLDE 4.0 EZ CLN MEGD (MISCELLANEOUS) IMPLANT
ELECTRODE REM PT RTRN 9FT ADLT (ELECTROSURGICAL) ×1 IMPLANT
GAUZE PAD ABD 8X10 STRL (GAUZE/BANDAGES/DRESSINGS) IMPLANT
GAUZE XEROFORM 1X8 LF (GAUZE/BANDAGES/DRESSINGS) IMPLANT
GLOVE BIO SURGEON STRL SZ 6 (GLOVE) ×2 IMPLANT
GLOVE BIO SURGEON STRL SZ7.5 (GLOVE) ×2 IMPLANT
GLOVE BIOGEL PI IND STRL 6.5 (GLOVE) ×1 IMPLANT
GLOVE BIOGEL PI IND STRL 8 (GLOVE) ×1 IMPLANT
GOWN STRL REUS W/ TWL LRG LVL3 (GOWN DISPOSABLE) ×2 IMPLANT
GOWN STRL REUS W/TWL XL LVL3 (GOWN DISPOSABLE) ×1 IMPLANT
IMPL TAPESTRY BIOINTEGR 40X30 (Mesh General) IMPLANT
MANIFOLD NEPTUNE II (INSTRUMENTS) ×1 IMPLANT
NDL HYPO 22X1.5 SAFETY MO (MISCELLANEOUS) ×1 IMPLANT
NEEDLE HYPO 22X1.5 SAFETY MO (MISCELLANEOUS) ×1
NS IRRIG 1000ML POUR BTL (IV SOLUTION) ×1 IMPLANT
PACK BASIN DAY SURGERY FS (CUSTOM PROCEDURE TRAY) ×1 IMPLANT
PAD COLD SHLDR WRAP-ON (PAD) ×1 IMPLANT
PENCIL SMOKE EVACUATOR (MISCELLANEOUS) ×1 IMPLANT
SLEEVE SCD COMPRESS KNEE MED (STOCKING) ×1 IMPLANT
SPIKE FLUID TRANSFER (MISCELLANEOUS) IMPLANT
SPONGE T-LAP 18X18 ~~LOC~~+RFID (SPONGE) ×1 IMPLANT
SUCTION TUBE FRAZIER 10FR DISP (SUCTIONS) ×1 IMPLANT
SUT ETHILON 3 0 PS 1 (SUTURE) IMPLANT
SUT MNCRL AB 3-0 PS2 27 (SUTURE) IMPLANT
SUT VIC AB 0 CT1 27XBRD ANBCTR (SUTURE) ×1 IMPLANT
SUT VIC AB 2-0 CT1 TAPERPNT 27 (SUTURE) ×1 IMPLANT
SYR 20ML LL LF (SYRINGE) ×1 IMPLANT
SYR BULB EAR ULCER 3OZ GRN STR (SYRINGE) ×1 IMPLANT
TOWEL GREEN STERILE FF (TOWEL DISPOSABLE) ×2 IMPLANT
TUBE CONNECTING 20X1/4 (TUBING) ×1 IMPLANT
UNDERPAD 30X36 HEAVY ABSORB (UNDERPADS AND DIAPERS) IMPLANT
YANKAUER SUCT BULB TIP NO VENT (SUCTIONS) ×1 IMPLANT

## 2023-09-09 NOTE — Anesthesia Preprocedure Evaluation (Addendum)
Anesthesia Evaluation  Patient identified by MRN, date of birth, ID band Patient awake    Reviewed: Allergy & Precautions, H&P , NPO status , Patient's Chart, lab work & pertinent test results, reviewed documented beta blocker date and time   Airway Mallampati: III  TM Distance: >3 FB Neck ROM: Full    Dental  (+) Teeth Intact, Dental Advisory Given   Pulmonary neg pulmonary ROS   Pulmonary exam normal breath sounds clear to auscultation       Cardiovascular hypertension (147/93 preop), Pt. on medications and Pt. on home beta blockers Normal cardiovascular exam Rhythm:Regular Rate:Normal     Neuro/Psych  Headaches PSYCHIATRIC DISORDERS Anxiety        GI/Hepatic negative GI ROS, Neg liver ROS,,,  Endo/Other  Hypothyroidism    Renal/GU negative Renal ROS  negative genitourinary   Musculoskeletal  (+) Arthritis , Osteoarthritis,    Abdominal   Peds negative pediatric ROS (+)  Hematology negative hematology ROS (+)   Anesthesia Other Findings   Reproductive/Obstetrics negative OB ROS                             Anesthesia Physical Anesthesia Plan  ASA: 2  Anesthesia Plan: General   Post-op Pain Management: Tylenol PO (pre-op)*   Induction: Intravenous  PONV Risk Score and Plan: 3 and Ondansetron and Treatment may vary due to age or medical condition  Airway Management Planned: Oral ETT  Additional Equipment: None  Intra-op Plan:   Post-operative Plan: Extubation in OR  Informed Consent: I have reviewed the patients History and Physical, chart, labs and discussed the procedure including the risks, benefits and alternatives for the proposed anesthesia with the patient or authorized representative who has indicated his/her understanding and acceptance.     Dental advisory given  Plan Discussed with: CRNA  Anesthesia Plan Comments:         Anesthesia Quick Evaluation

## 2023-09-09 NOTE — Op Note (Addendum)
Date of Surgery: 09/09/2023  INDICATIONS: Ms. Frogge is a 83 y.o.-year-old female with left hip gluteus medius tearing with significant fatty atrophy.  The risk and benefits of the procedure were discussed in detail and documented in the pre-operative evaluation.   PREOPERATIVE DIAGNOSIS: 1. Left hip gluteus medius tear with complete atrophy of the muscle  POSTOPERATIVE DIAGNOSIS: Same.  PROCEDURE: 1. Left hip gluteus maximus tendon transfer 2. Left hip trochanteric bursectomy  SURGEON: Benancio Deeds MD  ASSISTANT: Ardeen Fillers, ATC  ANESTHESIA:  general  IV FLUIDS AND URINE: See anesthesia record.  ANTIBIOTICS: Ancef  ESTIMATED BLOOD LOSS: 10 mL.  IMPLANTS:  Implant Name Type Inv. Item Serial No. Manufacturer Lot No. LRB No. Used Action  ANCHOR SUT QUATTRO KNTLS 4.5 - [id number] Anchor ANCHOR SUT QUATTRO KNTLS 4.5  ZIMMER [employer]ON(ORTH,TRAU,BIO,SG) [id number] Left 1 Implanted  ANCHOR SUT QUATTRO KNTLS 4.5 - [id number] Anchor ANCHOR SUT QUATTRO KNTLS 4.5  ZIMMER RECON(ORTH,TRAU,BIO,SG) [id number] Left 1 Implanted  ANCHOR JUGGERKNOT SOFT 1.5 - [id number] Anchor ANCHOR JUGGERKNOT SOFT 1.5  ZIMMER [employer]ON(ORTH,TRAU,BIO,SG) [id number] Left 1 Implanted  ANCHOR JUGGERKNOT SOFT 1.5 - [id number] Anchor ANCHOR JUGGERKNOT SOFT 1.5  [employer]IMMER [employer]ON(ORTH,TRAU,BIO,SG) [id number] Left 1 Implanted  IMPL TAPESTRY BIOINTEGR 40X30 - [id number] Mesh General IMPL TAPESTRY BIOINTEGR 40X30  ZIMMER [employer]ON(ORTH,TRAU,BIO,SG) [id number] Left 1 Implanted    DRAINS: None  CULTURES: None  COMPLICATIONS: none  DESCRIPTION OF PROCEDURE:  The patient was identified in the preoperative holding area.  Antibiotics were given 1 hour prior to skin incision.  Timeout was performed and surgical site was marked with nursing.  The patient was subsequently taken back to the operating room.  Anesthesia was induced. The patient was placed in the lateral decubitus position.  Axillary roll and down peroneal nerve was padded well   At  this time I began with an approach to the lateral trochanter centered at the vastus ridge.  15 blade was used to incise skin.  This was done down to the level of the IT band.  Electrocautery was utilized to achieve hemostasis.  At this time the IT band was marked in such a way as to preserve an anterior flap.  This was incised completely with a of an abduction.  Gluteus medius was completely torn and found to be nonviable with significant fatty infiltration.  At this time a flap was mobilized anteriorly from the gluteus maximus in a triangular fashion.  This mobilized well over to the trochanter. The trochanteric bursectomy was seen and excises with Metzenbaums.   A double row construct was utilized with 2 medial row all suture anchors.  These were brought up through the remaining gluteus medius tendon and into the flap.  2 of these were tied to provisionally stabilize the flap.  These were then brought up to the distal most aspect of the flap and brought into 2 lateral row anchors over the native footprint of the gluteus medius.  Given the quality of the tendon the decision was made to supplement with a collagen patch.  This was sutured in the corners into the native insertion of the gluteus medius.   This time the wound was thoroughly irrigated.  The posterior aspect of the IT band was sutured to the posterior limb of the flap.  At this time, the collagen patch was used to augment to the repair by placing this over the flap and suturing into place in the corners with 0 vicryl. This was done in an interrupted fashion.  The wound was again  irrigated and closed in layers of 0 Vicryl, 2-0 Vicryl and 3-0 nylon.  Xeroform, gauze, Aquacel dressing were applied.   Instrument, sponge, and needle counts were correct prior to wound closure and at the conclusion of the case.  The patient was taken to the PACU without complication         POSTOPERATIVE PLAN: Weight bearing as tolerated left leg.  She will be placed  on Aspirin for blood clot prevention.  I will see the patient back in 2 weeks.   Benancio Deeds, MD 8:45 AM

## 2023-09-09 NOTE — Anesthesia Procedure Notes (Signed)
Procedure Name: Intubation Date/Time: 09/09/2023 7:35 AM  Performed by: Roosvelt Harps, CRNAPre-anesthesia Checklist: Patient identified, Emergency Drugs available, Suction available and Patient being monitored Patient Re-evaluated:Patient Re-evaluated prior to induction Oxygen Delivery Method: Circle System Utilized Preoxygenation: Pre-oxygenation with 100% oxygen Induction Type: IV induction Ventilation: Mask ventilation without difficulty Laryngoscope Size: Mac and 3 Grade View: Grade I Tube type: Oral Tube size: 7.0 mm Number of attempts: 1 Airway Equipment and Method: Stylet and Oral airway Placement Confirmation: ETT inserted through vocal cords under direct vision, positive ETCO2 and breath sounds checked- equal and bilateral Secured at: 20 cm Tube secured with: Tape Dental Injury: Teeth and Oropharynx as per pre-operative assessment

## 2023-09-09 NOTE — Transfer of Care (Signed)
Immediate Anesthesia Transfer of Care Note  Patient: Emily [name]  Procedure(s) Performed: LEFT GLUTEUS MAXIMUS TENDON TRANSFER REPAIR (Left: Hip)  Patient Location: PACU  Anesthesia Type:General  Level of Consciousness: awake  Airway & Oxygen Therapy: Patient Spontanous Breathing and Patient connected to face mask oxygen  Post-op Assessment: Report given to RN and Post -op Vital signs reviewed and stable  Post vital signs: Reviewed and stable  Last Vitals:  Vitals Value Taken Time  BP 142/86 09/09/23 0845  Temp 36.2 C 09/09/23 0845  Pulse 87 09/09/23 0845  Resp 19 09/09/23 0845  SpO2 99 % 09/09/23 0845  Vitals shown include unfiled device data.  Last Pain:  Vitals:   09/09/23 0629  TempSrc: Oral  PainSc: 0-No pain      Patients Stated Pain Goal: 3 (09/09/23 6384)  Complications: No notable events documented.

## 2023-09-09 NOTE — Anesthesia Postprocedure Evaluation (Signed)
Anesthesia Post Note  Patient: Emily [name]  Procedure(s) Performed: LEFT GLUTEUS MAXIMUS TENDON TRANSFER REPAIR (Left: Hip)     Patient location during evaluation: PACU Anesthesia Type: General Level of consciousness: awake and alert, oriented and patient cooperative Pain management: pain level controlled Vital Signs Assessment: post-procedure vital signs reviewed and stable Respiratory status: spontaneous breathing, nonlabored ventilation and respiratory function stable Cardiovascular status: blood pressure returned to baseline and stable Postop Assessment: no apparent nausea or vomiting Anesthetic complications: no   No notable events documented.  Last Vitals:  Vitals:   09/09/23 0945 09/09/23 1006  BP: 132/77 (!) 166/88  Pulse: 73 87  Resp: 12 16  Temp:  (!) 36.1 C  SpO2: 95% 95%    Last Pain:  Vitals:   09/09/23 1006  TempSrc: Temporal  PainSc: 4                  Lannie Fields

## 2023-09-09 NOTE — Brief Op Note (Signed)
   Brief Op Note  Date of Surgery: 09/09/2023  Preoperative Diagnosis: LEFT GLUTEUS MEDIUS TEAR  Postoperative Diagnosis: same  Procedure: Procedure(s): LEFT GLUTEUS MAXIMUS TENDON TRANSFER REPAIR  Implants: Implant Name Type Inv. Item Serial No. Manufacturer Lot No. LRB No. Used Action  ANCHOR SUT QUATTRO KNTLS 4.5 - [id number] Anchor ANCHOR SUT QUATTRO KNTLS 4.5  [employer](ORTH,TRAU,BIO,SG) [id number] Left 1 Implanted  ANCHOR SUT QUATTRO KNTLS 4.5 - [id number] Anchor ANCHOR SUT QUATTRO KNTLS 4.5  [employer]ON(ORTH,TRAU,BIO,SG) [id number] Left 1 Implanted  ANCHOR JUGGERKNOT SOFT 1.5 - [id number] Anchor ANCHOR JUGGERKNOT SOFT 1.5  [employer](ORTH,TRAU,BIO,SG) [id number] Left 1 Implanted  ANCHOR JUGGERKNOT SOFT 1.5 - [id number] Anchor ANCHOR JUGGERKNOT SOFT 1.5  [employer](ORTH,TRAU,BIO,SG) [id number] Left 1 Implanted  IMPL TAPESTRY BIOINTEGR 40X30 - [id number] Mesh General IMPL TAPESTRY BIOINTEGR 40X30  [employer]IMMER [employer]ON(ORTH,TRAU,BIO,SG) [id number] Left 1 Implanted    Surgeons: Surgeon(s): Huel Cote, MD  Anesthesia: General    Estimated Blood Loss: See anesthesia record  Complications: None  Condition to PACU: Stable  Benancio Deeds, MD 09/09/2023 8:45 AM

## 2023-09-09 NOTE — Discharge Instructions (Addendum)
Discharge Instructions    Attending Surgeon: Huel Cote, MD Office Phone Number: [phone]   Diagnosis and Procedures:    Surgeries Performed: Left hip gluteus maximus tendon transfer  Discharge Plan:    Diet: Resume usual diet. Begin with light or bland foods.  Drink plenty of fluids.  Activity:  Weight bearing as tolerated left leg. You are advised to go home directly from the hospital or surgical center. Restrict your activities.  GENERAL INSTRUCTIONS: 1.  Please apply ice to your wound to help with swelling and inflammation. This will improve your comfort and your overall recovery following surgery.     2. Please call Dr. Serena Croissant office at [phone] with questions Monday-Friday during business hours. If no one answers, please leave a message and someone should get back to the patient within 24 hours. For emergencies please call 911 or proceed to the emergency room.   3. Patient to notify surgical team if experiences any of the following: Bowel/Bladder dysfunction, uncontrolled pain, nerve/muscle weakness, incision with increased drainage or redness, nausea/vomiting and Fever greater than 101.0 F.  Be alert for signs of infection including redness, streaking, odor, fever or chills. Be alert for excessive pain or bleeding and notify your surgeon immediately.  WOUND INSTRUCTIONS:   Leave your dressing, cast, or splint in place until your post operative visit.  Keep it clean and dry.  Always keep the incision clean and dry until the staples/sutures are removed. If there is no drainage from the incision you should keep it open to air. If there is drainage from the incision you must keep it covered at all times until the drainage stops  Do not soak in a bath tub, hot tub, pool, lake or other body of water until 21 days after your surgery and your incision is completely dry and healed.  If you have removable sutures (or staples) they must be removed 10-14 days  (unless otherwise instructed) from the day of your surgery.     1)  Elevate the extremity as much as possible.  2)  Keep the dressing clean and dry.  3)  Please call us if the dressing becomes wet or dirty.  4)  If you are experiencing worsening pain or worsening swelling, please call.     MEDICATIONS: Resume all previous home medications at the previous prescribed dose and frequency unless otherwise noted Start taking the  pain medications on an as-needed basis as prescribed  Please taper down pain medication over the next week following surgery.  Ideally you should not require a refill of any narcotic pain medication.  Take pain medication with food to minimize nausea. In addition to the prescribed pain medication, you may take over-the-counter pain relievers such as Tylenol.  Do NOT take additional tylenol if your pain medication already has tylenol in it.  Aspirin 325mg  daily per instructions on bottle. Narcotic policy: Per [facility] clinic policy, our goal is ensure optimal postoperative pain control with a multimodal pain management strategy. For all [facility] patients, our goal is to wean post-operative narcotic medications by 6 weeks post-operatively, and many times sooner. If this is not possible due to utilization of pain medication prior to surgery, your [facility] doctor will support your acute post-operative pain control for the first 6 weeks postoperatively, with a plan to transition you back to your primary pain team following that. Cyndia Skeeters will work to ensure a Therapist, occupational.       FOLLOWUP INSTRUCTIONS: 1. Follow up at the  Physical Therapy Clinic 3-4 days following surgery. This appointment should be scheduled unless other arrangements have been made.The Physical Therapy scheduling number is [phone] if an appointment has not already been arranged.  2. Contact Dr. Serena Croissant office during office hours at [phone] or the practice after hours line at [phone] for  non-emergencies. For medical emergencies call 911.   Discharge Location: Home    Post Anesthesia Home Care Instructions  Activity: Get plenty of rest for the remainder of the day. A responsible individual must stay with you for 24 hours following the procedure.  For the next 24 hours, DO NOT: -Drive a car -Advertising copywriter -Drink alcoholic beverages -Take any medication unless instructed by your physician -Make any legal decisions or sign important papers.  Meals: Start with liquid foods such as gelatin or soup. Progress to regular foods as tolerated. Avoid greasy, spicy, heavy foods. If nausea and/or vomiting occur, drink only clear liquids until the nausea and/or vomiting subsides. Call your physician if vomiting continues.  Special Instructions/Symptoms: Your throat may feel dry or sore from the anesthesia or the breathing tube placed in your throat during surgery. If this causes discomfort, gargle with warm salt water. The discomfort should disappear within 24 hours.  If you had a scopolamine patch placed behind your ear for the management of post- operative nausea and/or vomiting:  1. The medication in the patch is effective for 72 hours, after which it should be removed.  Wrap patch in a tissue and discard in the trash. Wash hands thoroughly with soap and water. 2. You may remove the patch earlier than 72 hours if you experience unpleasant side effects which may include dry mouth, dizziness or visual disturbances. 3. Avoid touching the patch. Wash your hands with soap and water after contact with the patch.     Next dose of Tylenol may be given at 12:30pm if needed.

## 2023-09-09 NOTE — Interval H&P Note (Signed)
History and Physical Interval Note:  09/09/2023 7:01 AM  Emily [name]  has presented today for surgery, with the diagnosis of LEFT GLUTEUS MEDIUS TEAR.  The various methods of treatment have been discussed with the patient and family. After consideration of risks, benefits and other options for treatment, the patient has consented to  Procedure(s): LEFT GLUTEUS MAXIMUS TENDON TRANSFER REPAIR (Left) as a surgical intervention.  The patient's history has been reviewed, patient examined, no change in status, stable for surgery.  I have reviewed the patient's chart and labs.  Questions were answered to the patient's satisfaction.     Huel Cote

## 2023-09-10 ENCOUNTER — Encounter (HOSPITAL_BASED_OUTPATIENT_CLINIC_OR_DEPARTMENT_OTHER): Payer: Self-pay | Admitting: Orthopaedic Surgery

## 2023-09-11 ENCOUNTER — Encounter (HOSPITAL_BASED_OUTPATIENT_CLINIC_OR_DEPARTMENT_OTHER): Payer: Self-pay | Admitting: Physical Therapy

## 2023-09-11 ENCOUNTER — Other Ambulatory Visit: Payer: Self-pay

## 2023-09-11 ENCOUNTER — Ambulatory Visit (HOSPITAL_BASED_OUTPATIENT_CLINIC_OR_DEPARTMENT_OTHER): Payer: PPO | Attending: Orthopaedic Surgery | Admitting: Physical Therapy

## 2023-09-11 DIAGNOSIS — M67952 Unspecified disorder of synovium and tendon, left thigh: Secondary | ICD-10-CM | POA: Diagnosis not present

## 2023-09-11 DIAGNOSIS — M6281 Muscle weakness (generalized): Secondary | ICD-10-CM | POA: Diagnosis not present

## 2023-09-11 DIAGNOSIS — R262 Difficulty in walking, not elsewhere classified: Secondary | ICD-10-CM | POA: Diagnosis not present

## 2023-09-11 DIAGNOSIS — M25552 Pain in left hip: Secondary | ICD-10-CM | POA: Insufficient documentation

## 2023-09-11 NOTE — Therapy (Signed)
OUTPATIENT PHYSICAL THERAPY EVALUATION   Patient Name: Emily [name] MRN: [id number] DOB:November 01, 1940, 83 y.o., female Today's Date: 09/11/2023  END OF SESSION:  PT End of Session - 09/11/23 1144     Visit Number 1    Number of Visits 28    Date for PT Re-Evaluation 12/20/23    Authorization Type HTA, no VL    PT Start Time 1103    PT Stop Time 1143    PT Time Calculation (min) 40 min    Activity Tolerance Patient tolerated treatment well    Behavior During Therapy WFL for tasks assessed/performed             Past Medical History:  Diagnosis Date   Actinic keratosis    Allergic rhinitis    BACK PAIN    CARCINOMA, BREAST, 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   L s/p lumpectomy, chemo and XRT   Cervicalgia    Cholelithiasis    DIVERTICULOSIS, COLON    ENDOMETRIOSIS    Heart palpitations    HYPOTHYROIDISM    Left wrist fracture 11/06/2016   10/2016 - fell of high stool   Osteoporosis    Palpitations    Personal history of chemotherapy    Personal history of radiation therapy    Pleural plaque without asbestos    CT chest 10/2010: R>L lobes, upper> lower   Renal disorder    Squamous cell carcinoma    History of BC   Past Surgical History:  Procedure Laterality Date   BREAST LUMPECTOMY  2001   left   CATARACT EXTRACTION Bilateral 09/2014   both eyes   CHOLECYSTECTOMY  2010   ENDOMETRIAL ABLATION     EXCISION / BIOPSY BREAST / NIPPLE / DUCT Left 2001   GLUTEUS MINIMUS REPAIR Left 09/09/2023   Procedure: LEFT GLUTEUS MAXIMUS TENDON TRANSFER REPAIR;  Surgeon: Huel Cote, MD;  Location: [facility];  Service: Orthopedics;  Laterality: Left;   Patient Active Problem List   Diagnosis Date Noted   Tendinopathy of left gluteus medius 09/09/2023   Acute cystitis 04/16/2023   Change in bowel habits 01/15/2023   Anxiety 01/01/2023   Agatston coronary artery calcium score 22.4  (12/2021) 10/30/2022   Urge incontinence of urine 10/30/2022   Word  finding difficulty 10/30/2022   Chronic pain of left knee 08/21/2022   Lumbar radiculopathy 03/25/2022   Cough 12/27/2021   Sleep difficulties 09/27/2021   Cervicogenic headache 04/10/2021   Elevated blood pressure reading 04/10/2021   Skin abnormality 02/27/2021   Aortic atherosclerosis (HCC) 09/22/2020   Urinary urgency 03/23/2020   CKD (chronic kidney disease) stage 3, GFR 30-59 ml/min (HCC) 03/22/2020   Prediabetes 03/22/2020   Change in stool 11/17/2019   Right-sided chest wall pain 11/17/2019   Urinary retention 09/23/2019   SOB (shortness of breath) 01/15/2019   Upper airway cough syndrome 01/15/2019   Belching 12/14/2018   Reactive airway disease 12/05/2018   Tear of left rotator cuff 10/05/2018   Pre-syncope 05/19/2018   DDD (degenerative disc disease), cervical 01/15/2018   Neuralgia 09/12/2017   Presbycusis of both ears 12/11/2016   Memory difficulties 11/06/2016   IBS (irritable bowel syndrome) 04/16/2016   Hyperlipidemia 09/10/2015   Osteoporosis 09/08/2015   Palpitations    Pleural plaque without asbestos 12/03/2010   Chronic neck pain 12/18/2009   Diverticulosis of colon 03/02/2009   Lumbago 03/11/2008   Actinic keratosis 01/23/2008   Malignant neoplasm of female breast (HCC) 10/12/2007   Hypothyroidism 06/18/2007   Allergic  rhinitis 06/11/2007      REFERRING PROVIDER:  Huel Cote, MD     REFERRING DIAG:  [phone] (ICD-10-CM) - Tendinopathy of left gluteus medius    s/p Lt GMR  Rationale for Evaluation and Treatment: Rehabilitation  THERAPY DIAG:  Pain in left hip  Difficulty in walking, not elsewhere classified  Muscle weakness (generalized)  ONSET DATE: DOS 09/09/23   SUBJECTIVE:                                                                                                                                                                                           SUBJECTIVE STATEMENT: I went to PT and did injections for over a year  and it did not help.   PERTINENT HISTORY:  Urge incontinence, chronic Lt hip & knee pain, OP  PAIN:  Are you having pain? Yes: NPRS scale: 7-8/10 Pain location: Lt lateral hip and down to knee Pain description: ache, dull  Aggravating factors: constant Relieving factors: meds  PRECAUTIONS:  None  RED FLAGS: None   WEIGHT BEARING RESTRICTIONS:  Yes WBAT  FALLS:  Has patient fallen in last 6 months? No  LIVING ENVIRONMENT: 2 steps to enter home with handrail on Rt when ascending Does not need to go upstairs- bedroom is on the first floor  OCCUPATION:  retired  PLOF:  Independent  PATIENT GOALS:  Walk without pain and limping, stairs, water walking, I have a treadmill at home.    OBJECTIVE:  Note: Objective measures were completed at Evaluation unless otherwise noted.  PATIENT SURVEYS:  FOTO 28  COGNITIVE STATUS: Within functional limits for tasks assessed   SENSATION: WFL   POSTURE:  No Significant postural limitations  GAIT: Comments: arrived with RW and PWB   Body Part #1 Hip  PALPATION: EVAL: PROM to 90 deg without pain, stretchy end feel                                                                                                                              TREATMENT DATE:  Treatment  1/23 EVAL:  Fit rollator See HEP Did not change bandage- aquacel present    PATIENT EDUCATION:  Education details: Anatomy of condition, POC, HEP, exercise form/rationale Person educated: Patient and Spouse Education method: Explanation, Demonstration, Tactile cues, Verbal cues, and Handouts Education comprehension: verbalized understanding, returned demonstration, verbal cues required, tactile cues required, and needs further education  HOME EXERCISE PROGRAM: [id number]   ASSESSMENT:  CLINICAL IMPRESSION: Patient is a 83 y.o. F who was seen today for physical therapy evaluation and treatment for s/p Lt GMR.      REHAB POTENTIAL: Good  CLINICAL DECISION MAKING: Stable/uncomplicated  EVALUATION COMPLEXITY: Low   GOALS: Goals reviewed with patient? Yes  SHORT TERM GOALS: Target date: 10/07/23 (4 wks post op)  Able to demo gait with proper pattern for houshold distances without AD Baseline: Goal status: INITIAL  2.  Hip flexion to 90 deg without pain Baseline:  Goal status: INITIAL  3.  Good quad and glut activation in CKC Baseline:  Goal status: INITIAL   LONG TERM GOALS: Target date: POC Date  Perform stairs step over step without pain Baseline:  Goal status: INITIAL  2.  MMT 90% of opposite lower extremity Baseline:  Goal status: INITIAL  3.  Ambulate community distances without pain Baseline:  Goal status: INITIAL  4.  Perform proper pelvic control in squat and SLS Baseline:  Goal status: INITIAL    PLAN:  PT FREQUENCY: 1-2x/week  PT DURATION: other: POC date  PLANNED INTERVENTIONS: 97164- PT Re-evaluation, 97110-Therapeutic exercises, 97530- Therapeutic activity, 97112- Neuromuscular re-education, 97535- Self Care, 09811- Manual therapy, [phone]- Gait training, [phone]- Aquatic Therapy, Patient/Family education, Balance training, Stair training, Taping, Dry Needling, Joint mobilization, Spinal mobilization, Scar mobilization, Cryotherapy, and Moist heat.  PLAN FOR NEXT SESSION: per protocol, scheduled to do aquatic PT after incision heals   Joselin Crandell C. Jasmyne Lodato PT, DPT 09/11/23 11:53 AM

## 2023-09-12 ENCOUNTER — Encounter (HOSPITAL_BASED_OUTPATIENT_CLINIC_OR_DEPARTMENT_OTHER): Payer: Medicare HMO

## 2023-09-15 ENCOUNTER — Ambulatory Visit (INDEPENDENT_AMBULATORY_CARE_PROVIDER_SITE_OTHER): Payer: PPO | Admitting: Neurology

## 2023-09-15 ENCOUNTER — Encounter: Payer: Self-pay | Admitting: Neurology

## 2023-09-15 ENCOUNTER — Telehealth: Payer: Self-pay | Admitting: *Deleted

## 2023-09-15 VITALS — BP 118/72 | HR 72 | Ht 60.0 in | Wt 125.0 lb

## 2023-09-15 DIAGNOSIS — M47812 Spondylosis without myelopathy or radiculopathy, cervical region: Secondary | ICD-10-CM | POA: Diagnosis not present

## 2023-09-15 DIAGNOSIS — M5481 Occipital neuralgia: Secondary | ICD-10-CM

## 2023-09-15 NOTE — Telephone Encounter (Signed)
Emily [name]

## 2023-09-15 NOTE — Telephone Encounter (Signed)
Marland Kitchen

## 2023-09-15 NOTE — Progress Notes (Unsigned)
[facility]    Provider:  Dr Lucia Gaskins Requesting Provider: Pincus Sanes, MD Primary Care Provider:  Pincus Sanes, MD  CC:  headaches  09/13/2023: Patient is here for a new consult for cervicogenic headache however I have seen her in the past for the same, in the past she had epidurals in the spine and since then she had no headaches, pain started in the neck and went to the forehead.  At that time we ordered an MRI of the cervical spine and we referred back to Dr. Horald Chestnut as she has been under the care of physicians for years, tried conservative measures, getting injections, had PT, pain was definitely in the cervical spine, and we asked her to go back to Dr. Horald Chestnut for neck injections procedures medial branch blocks or radiofrequency ablation or what ever he thought was appropriate at this time patient needs pain management which we do not provide here in neurology.  She is here with her husband. She bring in an MRI of the cervical spine. The insurance denied her RFA. The headaches start at the emergence of the occipital nerve and radiates to the top and sides of the head and to the forehead. Only happens at night when laying down on her back and on the side. Husband provides much information. Dr. Ethelene Hal performed nerve blocks. For the next 4 hours she reported no pain but she only gets it at night. Unclear if he did medial branch blocks. No other focal neurologic deficits, associated symptoms, inciting events or modifiable factors.   We discussed plan see A/P  Patient complains of symptoms per HPI as well as the following symptoms: sciatica . Pertinent negatives and positives per HPI. All others negative  Reviewed notes as follows: MRi of the cervical spine, requesting on disk:  MRI of the cervical spine dictated from report June 30, 2022 comparisons Jan 03 2018.  General: Mild reversal of normal cervical lordosis, apex of curvature centered at C4, degenerative minimal  anterolisthesis of C3 on C4, mild retrolisthesis of C4 on C5 and minimal retrolisthesis of C5 on C6.  No evidence of fracture or abnormal marrow replacing lesion.  Bilateral lens replacements are normal.  C1 and C2 craniocervical junction unremarkable  C2 and C3 minimal broad central disc osteophyte complex, severe right facet arthrosis, with mild-moderate right neuroforaminal narrowing without spinal canal stenosis  C3-C4 degenerative minimal anterolisthesis of C3 on C4 with severe left and mild/moderate right facet arthrosis.  Mild broad central disc osteophyte complex with left greater than right uncovertebral osteophytosis.  Mild left neuroforaminal narrowing.  C4-C5: Mild retrolisthesis of C4 and C5, severe bulging disc osteophyte complex most pronounced centrally, severe left and moderate right uncovertebral osteophytosis, moderate severe left facet arthrosis, mild deformity of the cord most pronounced central right central without spinal canal stenosis, moderately severe bilateral neural foraminal stenosis  C5-C6 minimal retrolisthesis of C5 on C6, severe bulging disc osteophyte complex asymmetric to the right, moderately severe right and moderate left uncovertebral osteophytosis, moderate left and mild right facet arthrosis, mild spinal canal stenosis and mild deformity of cord, moderately severe right and mild left neuroforaminal narrowing progressed on the right.  C6-C7 moderate severe bulging disc osteophyte complex, moderate severe right and mild to moderate left uncovertebral osteophytosis, mild thickening of ligamentum flavum, without canal stenosis or deformity of the cord, moderate right neuroforaminal narrowing.  C7-T1 very small central disc osteophyte complex, moderate severe left facet arthrosis, without spinal canal stenosis or neuroforaminal narrowing.  Cord and intraspinal no cervical cord or intraspinal lesions  Impression: 1 moderate severe right and mild left C5-C6  neuroforaminal narrowing, impinging upon exiting right C6 nerve roots, progressed on the right.  Mild spinal canal stenosis and mild deformity of the cord mildly progressed. 2.  Moderately severe bilateral C4-C5 neuroforaminal narrowing, impinging upon exiting bilateral C5 nerve roots, mild deformity of the cord most most pronounced central right central without spinal canal stenosis, unchanged 3 moderate right C6-C7 neuroforaminal narrowing unchanged For mild left C3-C4 neuroforaminal narrowing unchanged 5 mild-moderate right C2-C3 neuroforaminal narrowing unchanged 6 mild reversal of normal cervical lordosis, apex centered at C4, degenerative minimal anterolisthesis of C3 on C4  Sign July 01, 2022.     HPI 05/16/2022:  Emily [name] is a 83 y.o. female here as requested by Pincus Sanes, MD for Headaches. PMHx hypothyroid, lumbar radiculopathy, cervical DDD, CKD, HLD, hyperglycemia, chronic neck pain.  Pt states she had a epidural last week in spine pt states since than no headaches Pt states before epidural headaches daily . Pt states pain started in neck and went of forehead  Pt had CT of spine has CD with her today from [facility]. She sees Dr. Ethelene Hal for epidural steroid injections. She has a lot of arthritis in the neck, pain starts in the back of the head and neck and radiates to the front. She had injections years ago into the neck in 2018-2019. Hurts worse at night when sleeping on it. She had a lot of low back pain and sciatica. Neck pain for years. 6 weeks started having this radiating pain from her neck to the back of the head. Dr. Ethelene Hal gave him a steroid epidural and it stopped. She is still having pain in the lower spine, she can hardly walk, the shot helped the the headache but not the low back pain temporarily. Low back pain, shooting down her leg on the left side, weakness and pain in the left leg worsening. No pain in the arms. Worsening neck and low back pain. No other  focal neurologic deficits, associated symptoms, inciting events or modifiable factors.  Reviewed notes, labs and imaging from outside physicians, which showed:   XR c-spine: FINDINGS: Straightening of the normal cervical lordosis with slight reversal. There is multilevel degenerative disc disease, moderate at C4-C5 and severe at C5-C6 and C6-C7. There is mild to moderate multilevel facet arthropathy. Trace degenerative anterolisthesis at C3-C4.   IMPRESSION: Multilevel degenerative disc disease, moderate at C4-C5 and severe at C5-C6 and C6-C7.   Mild to moderate multilevel facet arthropathy.   Trace degenerative anterolisthesis at C3-C4.     Electronically Signed   By: Caprice Renshaw M.D.   On: 04/11/2021 14:05  CLINICAL DATA:  Low back pain for 1 month.   EXAM: LUMBAR SPINE - COMPLETE 4+ VIEW   COMPARISON:  None Available.   FINDINGS: There is no evidence of lumbar spine fracture. Scoliosis of spine noted. Mild degenerative joint changes at L5-S1, L1-2, T12-L1 with narrowed joint space and osteophyte formation noted.   IMPRESSION: Mild degenerative joint changes of lumbar spine.     Electronically Signed   By: Sherian Rein M.D.   On: 04/03/2022 14:00  12/2021: cbc/cmp unremarkable except foe slightly elevated glucose (may not have been fasting) and slightly elevated creatinine 1.13  Review of Systems: Patient complains of symptoms per HPI as well as the following symptoms neck and back pain. Pertinent negatives and positives per HPI. All others negative.  Social History   Socioeconomic History   Marital status: Married    Spouse name: Not on file   Number of children: 2   Years of education: Not on file   Highest education level: Not on file  Occupational History   Occupation: Retired    Comment: Advertising account executive  Tobacco Use   Smoking status: Never   Smokeless tobacco: Never  Vaping Use   Vaping status: Never Used  Substance and Sexual Activity    Alcohol use: Not Currently   Drug use: No   Sexual activity: Not Currently    Birth control/protection: Post-menopausal  Other Topics Concern   Not on file  Social History Narrative   Married, lives in [place] area since 2008 from [place] to be close to dtr   Social Drivers of [employer] Strain: Low Risk  (02/06/2023)   Overall Financial Resource Strain (CARDIA)    Difficulty of Paying Living Expenses: Not hard at all  Food Insecurity: No Food Insecurity (02/06/2023)   Hunger Vital Sign    Worried About Running Out of Food in the Last Year: Never true    Ran Out of Food in the Last Year: Never true  Transportation Needs: No Transportation Needs (02/06/2023)   PRAPARE - Administrator, Civil Service (Medical): No    Lack of Transportation (Non-Medical): No  Physical Activity: Sufficiently Active (02/06/2023)   Exercise Vital Sign    Days of Exercise per Week: 5 days    Minutes of Exercise per Session: 30 min  Stress: No Stress Concern Present (02/06/2023)   [employer] of Occupational Health - Occupational Stress Questionnaire    Feeling of Stress : Not at all  Social Connections: Socially Integrated (02/06/2023)   Social Connection and Isolation Panel [NHANES]    Frequency of Communication with Friends and Family: More than three times a week    Frequency of Social Gatherings with Friends and Family: More than three times a week    Attends Religious Services: More than 4 times per year    Active Member of [employer] or Organizations: Yes    Attends Engineer, structural: More than 4 times per year    Marital Status: Married  Catering manager Violence: Not At Risk (02/06/2023)   Humiliation, Afraid, Rape, and Kick questionnaire    Fear of Current or Ex-Partner: No    Emotionally Abused: No    Physically Abused: No    Sexually Abused: No    Family History  Problem Relation Age of Onset   Dementia Mother    Heart disease Father    Emphysema  Father        smoked and worked with asbestos   Colon cancer Neg Hx    Esophageal cancer Neg Hx    Rectal cancer Neg Hx    Migraines Neg Hx    Headache Neg Hx     Past Medical History:  Diagnosis Date   Actinic keratosis    Allergic rhinitis    BACK PAIN    CARCINOMA, BREAST, 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   L s/p lumpectomy, chemo and XRT   Cervicalgia    Cholelithiasis    DIVERTICULOSIS, COLON    ENDOMETRIOSIS    Heart palpitations    HYPOTHYROIDISM    Left wrist fracture 11/06/2016   10/2016 - fell of high stool   Osteoporosis    Palpitations    Personal history of chemotherapy    Personal history of radiation  therapy    Pleural plaque without asbestos    CT chest 10/2010: R>L lobes, upper> lower   Renal disorder    Squamous cell carcinoma    History of BC    Patient Active Problem List   Diagnosis Date Noted   Tendinopathy of left gluteus medius 09/09/2023   Acute cystitis 04/16/2023   Change in bowel habits 01/15/2023   Anxiety 01/01/2023   Agatston coronary artery calcium score 22.4  (12/2021) 10/30/2022   Urge incontinence of urine 10/30/2022   Word finding difficulty 10/30/2022   Chronic pain of left knee 08/21/2022   Lumbar radiculopathy 03/25/2022   Cough 12/27/2021   Sleep difficulties 09/27/2021   Cervicogenic headache 04/10/2021   Elevated blood pressure reading 04/10/2021   Skin abnormality 02/27/2021   Aortic atherosclerosis (HCC) 09/22/2020   Urinary urgency 03/23/2020   CKD (chronic kidney disease) stage 3, GFR 30-59 ml/min (HCC) 03/22/2020   Prediabetes 03/22/2020   Change in stool 11/17/2019   Right-sided chest wall pain 11/17/2019   Urinary retention 09/23/2019   SOB (shortness of breath) 01/15/2019   Upper airway cough syndrome 01/15/2019   Belching 12/14/2018   Reactive airway disease 12/05/2018   Tear of left rotator cuff 10/05/2018   Pre-syncope 05/19/2018   DDD (degenerative disc disease), cervical 01/15/2018   Neuralgia  09/12/2017   Presbycusis of both ears 12/11/2016   Memory difficulties 11/06/2016   IBS (irritable bowel syndrome) 04/16/2016   Hyperlipidemia 09/10/2015   Osteoporosis 09/08/2015   Palpitations    Pleural plaque without asbestos 12/03/2010   Chronic neck pain 12/18/2009   Diverticulosis of colon 03/02/2009   Lumbago 03/11/2008   Actinic keratosis 01/23/2008   Malignant neoplasm of female breast (HCC) 10/12/2007   Hypothyroidism 06/18/2007   Allergic rhinitis 06/11/2007    Past Surgical History:  Procedure Laterality Date   BREAST LUMPECTOMY  2001   left   CATARACT EXTRACTION Bilateral 09/2014   both eyes   CHOLECYSTECTOMY  2010   ENDOMETRIAL ABLATION     EXCISION / BIOPSY BREAST / NIPPLE / DUCT Left 2001   GLUTEUS MINIMUS REPAIR Left 09/09/2023   Procedure: LEFT GLUTEUS MAXIMUS TENDON TRANSFER REPAIR;  Surgeon: Huel Cote, MD;  Location: [facility];  Service: Orthopedics;  Laterality: Left;    Current Outpatient Medications  Medication Sig Dispense Refill   atenolol (TENORMIN) 25 MG tablet TAKE 1 TABLET BY MOUTH EVERY DAY 90 tablet 2   Calcium Carb-Cholecalciferol (CALCIUM 600 + D PO) Take by mouth.     cetirizine-pseudoephedrine (ZYRTEC-D) 5-120 MG tablet Take 1 tablet by mouth 2 (two) times daily as needed for allergies.     cholecalciferol (VITAMIN D) 25 MCG (1000 UNIT) tablet Take 1,000 Units by mouth daily.     EPINEPHrine 0.3 mg/0.3 mL IJ SOAJ injection Inject 0.3 mg into the muscle as needed for anaphylaxis. 1 each 0   gabapentin (NEURONTIN) 100 MG capsule TAKE 1 CAPSULE (100 MG TOTAL) BY MOUTH AT BEDTIME. TAKE 1 TO 3 CAPSULES AT BEDTIME 30 capsule 3   levothyroxine (SYNTHROID) 75 MCG tablet TAKE 1 TABLET BY MOUTH EVERY DAY 90 tablet 3   ondansetron (ZOFRAN-ODT) 8 MG disintegrating tablet Take 1 tablet (8 mg total) by mouth every 8 (eight) hours as needed for nausea or vomiting. 10 tablet 0   chlorpheniramine (CHLOR-TRIMETON) 4 MG tablet Take 4 mg by  mouth 2 (two) times daily as needed for allergies. (Patient not taking: Reported on 09/15/2023)     oxyCODONE (ROXICODONE)  5 MG immediate release tablet Take 1 tablet (5 mg total) by mouth every 4 (four) hours as needed for severe pain (pain score 7-10) or breakthrough pain. (Patient not taking: Reported on 09/15/2023) 30 tablet 0   Current Facility-Administered Medications  Medication Dose Route Frequency Provider Last Rate Last Admin   [START ON 02/02/2024] denosumab (PROLIA) injection 60 mg  60 mg Subcutaneous Once Pincus Sanes, MD        Allergies as of 09/15/2023 - Review Complete 09/15/2023  Allergen Reaction Noted   Covid-19 (mrna) vaccine Anaphylaxis 10/29/2022   Molnupiravir Other (See Comments) 05/23/2021   Allegra [fexofenadine]  10/22/2019   Levaquin [levofloxacin] Swelling and Other (See Comments) 12/14/2016   Nexium [esomeprazole magnesium]  04/26/2014   Prednisone  12/03/2010   Valerian root Other (See Comments) 03/23/2020    Vitals: BP 118/72 (Cuff Size: Normal)   Pulse 72   Ht 5' (1.524 m)   Wt 125 lb (56.7 kg)   SpO2 98%   BMI 24.41 kg/m  Last Weight:  Wt Readings from Last 1 Encounters:  09/15/23 125 lb (56.7 kg)   Last Height:   Ht Readings from Last 1 Encounters:  09/15/23 5' (1.524 m)     Exam: NAD, pleasant                  Speech:    Speech is normal; fluent and spontaneous with normal comprehension.  Cognition:    The patient is oriented to person, place, and time;     recent and remote memory intact;     language fluent;    Cranial Nerves:    The pupils are equal, round, and reactive to light.Trigeminal sensation is intact and the muscles of mastication are normal. The face is symmetric. The palate elevates in the midline. Hearing intact. Voice is normal. Shoulder shrug is normal. The tongue has normal motion without fasciculations.   Coordination:  No dysmetria  Motor Observation:    No asymmetry, no atrophy, and no involuntary movements  noted. Tone:    Normal muscle tone.     Strength:    Strength is antigravity in the upper and lower limbs.      Sensation: intact to LT  Gait: antalgic with a walker(just had hip surgery      Assessment/Plan:  Patient with chronic neck and occipital neuralgia, significant multi-leveldegenerative disk disease under the care of physicians for years, tried conservative measures, been getting ESI injections(nneds RFA or medial branch blocks), also has had PT in the past > 3 months, worsening neck pain and cervicalgia. Headaches are likely from her neck, cervicalgia. MRi of the cervical spine  as below and will send disk, and patient will bring disk   Patient endorses pain the following distribution: she will even pay out of pocket for RFA for some refleif, has not been able to get medial brainch blocks or RFA, tried ESI but at this time needs intervention for occipital nerve pain or any other intervention per Dr. Pain intervention.   Orders Placed This Encounter  Procedures   Ambulatory referral to Pain Clinic   Ambulatory referral to Pain Clinic    team  PATIENT WILL BRING MRI CERVICAL SPINE WITH HER ON DISK SEE REPORT BELOW will try and get it uploaded in canopy or send with referral     Impression: 1 moderate severe right and mild left C5-C6 neuroforaminal narrowing, impinging upon exiting right C6 nerve roots, progressed on the right.  Mild spinal canal  stenosis and mild deformity of the cord mildly progressed. 2.  Moderately severe bilateral C4-C5 neuroforaminal narrowing, impinging upon exiting bilateral C5 nerve roots, mild deformity of the cord most most pronounced central right central without spinal canal stenosis, unchanged 3 moderate right C6-C7 neuroforaminal narrowing unchanged For mild left C3-C4 neuroforaminal narrowing unchanged 5 mild-moderate right C2-C3 neuroforaminal narrowing unchanged 6 mild reversal of normal cervical lordosis, apex centered at C4, degenerative  minimal anterolisthesis of C3 on C4  Cc: Burns, Bobette Mo, MD,  Pincus Sanes, MD  Naomie Dean, MD  [facility] [address] [place], [place] [address]  Phone [phone] Fax [phone]   I spent over 40 minutes of face-to-face and non-face-to-face time with patient on the  1. Bilateral occipital neuralgia   2. Spondylosis of cervical region without myelopathy or radiculopathy    diagnosis.  This included previsit chart review, lab review, study review, order entry, electronic health record documentation, patient education on the different diagnostic and therapeutic options, counseling and coordination of care, risks and benefits of management, compliance, or risk factor reduction

## 2023-09-15 NOTE — Patient Instructions (Addendum)
-   consider a soft collar  - Continue gabapentin at bedtime - will send for evaluation evaluation RFA or medial branch blocks  Radiofrequency ablation of the occipital nerve is a minimally invasive procedure that treats chronic pain in the head and neck. It uses radiofrequency energy to heat the occipital nerves, which prevents them from sending pain signals to the brain.  What it treats Occipital neuralgia: A condition that causes severe headaches, pain behind the eyes, light sensitivity, and scalp sensitivity  Cervicogenic headaches: Headaches that originate from the occipital nerves  How it's performed The procedure is performed on an outpatient basis  Local anesthetic is injected around the nerve  A radiofrequency machine, probe, and needle are used to heat the nerve to a temperature of around 42C  Image guidance is used to target the nerves  How it works The heat lesion created by the radiofrequency energy disrupts the nerve's ability to transmit pain signals  The pain relief can last for more than six months  Risks Infection, Bruising, Hematoma, Nerve injury, and Allergic reactions.

## 2023-09-16 ENCOUNTER — Encounter: Payer: Self-pay | Admitting: Neurology

## 2023-09-16 ENCOUNTER — Encounter (HOSPITAL_BASED_OUTPATIENT_CLINIC_OR_DEPARTMENT_OTHER): Payer: Self-pay | Admitting: Physical Therapy

## 2023-09-16 ENCOUNTER — Ambulatory Visit (HOSPITAL_BASED_OUTPATIENT_CLINIC_OR_DEPARTMENT_OTHER): Payer: PPO | Admitting: Physical Therapy

## 2023-09-16 DIAGNOSIS — M6281 Muscle weakness (generalized): Secondary | ICD-10-CM

## 2023-09-16 DIAGNOSIS — R262 Difficulty in walking, not elsewhere classified: Secondary | ICD-10-CM

## 2023-09-16 DIAGNOSIS — M25552 Pain in left hip: Secondary | ICD-10-CM

## 2023-09-16 NOTE — Therapy (Signed)
OUTPATIENT PHYSICAL THERAPY TREATMENT   Patient Name: Emily [name] MRN: [id number] DOB:1941/07/30, 83 y.o., female Today's Date: 09/16/2023  END OF SESSION:  PT End of Session - 09/16/23 0948     Visit Number 2    Number of Visits 28    Date for PT Re-Evaluation 12/20/23    Authorization Type HTA, no VL    PT Start Time 0855    PT Stop Time 0935    PT Time Calculation (min) 40 min    Activity Tolerance Patient tolerated treatment well    Behavior During Therapy WFL for tasks assessed/performed              Past Medical History:  Diagnosis Date   Actinic keratosis    Allergic rhinitis    BACK PAIN    CARCINOMA, BREAST, 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   L s/p lumpectomy, chemo and XRT   Cervicalgia    Cholelithiasis    DIVERTICULOSIS, COLON    ENDOMETRIOSIS    Heart palpitations    HYPOTHYROIDISM    Left wrist fracture 11/06/2016   10/2016 - fell of high stool   Osteoporosis    Palpitations    Personal history of chemotherapy    Personal history of radiation therapy    Pleural plaque without asbestos    CT chest 10/2010: R>L lobes, upper> lower   Renal disorder    Squamous cell carcinoma    History of BC   Past Surgical History:  Procedure Laterality Date   BREAST LUMPECTOMY  2001   left   CATARACT EXTRACTION Bilateral 09/2014   both eyes   CHOLECYSTECTOMY  2010   ENDOMETRIAL ABLATION     EXCISION / BIOPSY BREAST / NIPPLE / DUCT Left 2001   GLUTEUS MINIMUS REPAIR Left 09/09/2023   Procedure: LEFT GLUTEUS MAXIMUS TENDON TRANSFER REPAIR;  Surgeon: Huel Cote, MD;  Location: [facility];  Service: Orthopedics;  Laterality: Left;   Patient Active Problem List   Diagnosis Date Noted   Tendinopathy of left gluteus medius 09/09/2023   Acute cystitis 04/16/2023   Change in bowel habits 01/15/2023   Anxiety 01/01/2023   Agatston coronary artery calcium score 22.4  (12/2021) 10/30/2022   Urge incontinence of urine 10/30/2022   Word  finding difficulty 10/30/2022   Chronic pain of left knee 08/21/2022   Lumbar radiculopathy 03/25/2022   Cough 12/27/2021   Sleep difficulties 09/27/2021   Cervicogenic headache 04/10/2021   Elevated blood pressure reading 04/10/2021   Skin abnormality 02/27/2021   Aortic atherosclerosis (HCC) 09/22/2020   Urinary urgency 03/23/2020   CKD (chronic kidney disease) stage 3, GFR 30-59 ml/min (HCC) 03/22/2020   Prediabetes 03/22/2020   Change in stool 11/17/2019   Right-sided chest wall pain 11/17/2019   Urinary retention 09/23/2019   SOB (shortness of breath) 01/15/2019   Upper airway cough syndrome 01/15/2019   Belching 12/14/2018   Reactive airway disease 12/05/2018   Tear of left rotator cuff 10/05/2018   Pre-syncope 05/19/2018   DDD (degenerative disc disease), cervical 01/15/2018   Neuralgia 09/12/2017   Presbycusis of both ears 12/11/2016   Memory difficulties 11/06/2016   IBS (irritable bowel syndrome) 04/16/2016   Hyperlipidemia 09/10/2015   Osteoporosis 09/08/2015   Palpitations    Pleural plaque without asbestos 12/03/2010   Chronic neck pain 12/18/2009   Diverticulosis of colon 03/02/2009   Lumbago 03/11/2008   Actinic keratosis 01/23/2008   Malignant neoplasm of female breast (HCC) 10/12/2007   Hypothyroidism 06/18/2007  Allergic rhinitis 06/11/2007      REFERRING PROVIDER:  Huel Cote, MD     REFERRING DIAG:  [phone] (ICD-10-CM) - Tendinopathy of left gluteus medius    s/p Lt GMR  Rationale for Evaluation and Treatment: Rehabilitation  THERAPY DIAG:  Pain in left hip  Difficulty in walking, not elsewhere classified  Muscle weakness (generalized)  ONSET DATE: DOS 09/09/23   SUBJECTIVE:                                                                                                                                                                                           SUBJECTIVE STATEMENT: Pt is 1 week s/p L hip gluteus maximus tendon  transfers and trochanteric bursectomy.  Pt states she can't take pain meds, they make her vomit.  Pt is only taking Tylenol now.  Pt hasn't been doing her HEP much due to her pain.  Pt states she was ok after prior PT treatment.  Pt states MD thought she would be off her walker in 2 weeks.  Pt states she feels better using the walker.   PERTINENT HISTORY:  Urge incontinence, chronic Lt hip & knee pain, OP, bilat occipital neuralgia  PAIN:  Are you having pain? Yes: NPRS scale: 5/10 Pain location: Lt lateral hip and down to knee Pain description: ache, dull  Aggravating factors: constant Relieving factors: meds  PRECAUTIONS:  None  RED FLAGS: None   WEIGHT BEARING RESTRICTIONS:  Yes WBAT  FALLS:  Has patient fallen in last 6 months? No  LIVING ENVIRONMENT: 2 steps to enter home with handrail on Rt when ascending Does not need to go upstairs- bedroom is on the first floor  OCCUPATION:  retired  PLOF:  Independent  PATIENT GOALS:  Walk without pain and limping, stairs, water walking, I have a treadmill at home.    OBJECTIVE:  Note: Objective measures were completed at Evaluation unless otherwise noted.  TREATMENT DATE:  Observation: Pt has aquacel bandage over incision.  Pt has minimal expected bloody drainage, nothing abnormal.  She has bruising post to bandage.   Pt performed: Quad sets 2x10 with 5 sec hold SAQ 2x10 Supine Tra contraction--PT educated pt with correct performance and palpation of TrA Ankle pumps x20  Pt received L hip PROM in flexion and abduction per pt and tissue tolerance w/n protocol ranges.  Updated HEP.  Pt received a HEP handout and was instructed in correct form and appropriate frequency.  PT instructed pt she should not have pain with HEP.     PATIENT EDUCATION:  Education details: Anatomy of condition, POC,  HEP, exercise form/rationale, protocol restrictions and limitations. Person educated: Patient and Spouse Education method: Explanation, Demonstration, Tactile cues, Verbal cues, and Handouts Education comprehension: verbalized understanding, returned demonstration, verbal cues required, tactile cues required, and needs further education  HOME EXERCISE PROGRAM: [id number]  Updated HEP: - Supine Quadricep Sets  - 2 x daily - 7 x weekly - 1-2 sets - 10 reps - 5 seconds hold - ANKLE PUMPS  - 3 x daily - 7 x weekly - 2-3 sets - 10 reps  ASSESSMENT:  CLINICAL IMPRESSION: Pt's aquacel bandage is in place over incision and she has no abnormal signs or findings.  PT performed PROM per protocol ranges and she tolerated PROM well.  Pt performed exercises per protocol well with cuing and instruction in correct form.  She had no c/o's with exercises.  PT updated HEP and gave pt a HEP handout.  Pt demonstrates good understanding of HEP.   She responded well to Rx reporting improved pain from 5/10 before Rx to 3/10 after Rx.  She should benefit from cont skilled PT services to address impairments and goals and to improve overall function.     REHAB POTENTIAL: Good  CLINICAL DECISION MAKING: Stable/uncomplicated  EVALUATION COMPLEXITY: Low   GOALS: Goals reviewed with patient? Yes  SHORT TERM GOALS: Target date: 10/07/23 (4 wks post op)  Able to demo gait with proper pattern for houshold distances without AD Baseline: Goal status: INITIAL  2.  Hip flexion to 90 deg without pain Baseline:  Goal status: INITIAL  3.  Good quad and glut activation in CKC Baseline:  Goal status: INITIAL   LONG TERM GOALS: Target date: POC Date  Perform stairs step over step without pain Baseline:  Goal status: INITIAL  2.  MMT 90% of opposite lower extremity Baseline:  Goal status: INITIAL  3.  Ambulate community distances without pain Baseline:  Goal status: INITIAL  4.  Perform proper pelvic  control in squat and SLS Baseline:  Goal status: INITIAL    PLAN:  PT FREQUENCY: 1-2x/week  PT DURATION: other: POC date  PLANNED INTERVENTIONS: 97164- PT Re-evaluation, 97110-Therapeutic exercises, 97530- Therapeutic activity, 97112- Neuromuscular re-education, 97535- Self Care, 30865- Manual therapy, [phone]- Gait training, [phone]- Aquatic Therapy, Patient/Family education, Balance training, Stair training, Taping, Dry Needling, Joint mobilization, Spinal mobilization, Scar mobilization, Cryotherapy, and Moist heat.  PLAN FOR NEXT SESSION: per protocol, scheduled to do aquatic PT after incision heals   Audie Clear III PT, DPT 09/16/23 7:00 PM

## 2023-09-18 ENCOUNTER — Telehealth: Payer: Self-pay | Admitting: Neurology

## 2023-09-18 NOTE — Telephone Encounter (Signed)
Referral sent to Dr. Lorrine Kin at [facility] [phone]

## 2023-09-19 ENCOUNTER — Encounter (HOSPITAL_BASED_OUTPATIENT_CLINIC_OR_DEPARTMENT_OTHER): Payer: Medicare HMO | Admitting: Physical Therapy

## 2023-09-22 ENCOUNTER — Ambulatory Visit (INDEPENDENT_AMBULATORY_CARE_PROVIDER_SITE_OTHER): Payer: PPO | Admitting: Orthopaedic Surgery

## 2023-09-22 ENCOUNTER — Encounter (HOSPITAL_BASED_OUTPATIENT_CLINIC_OR_DEPARTMENT_OTHER): Payer: Self-pay | Admitting: Physical Therapy

## 2023-09-22 ENCOUNTER — Ambulatory Visit (HOSPITAL_BASED_OUTPATIENT_CLINIC_OR_DEPARTMENT_OTHER): Payer: PPO | Attending: Orthopaedic Surgery | Admitting: Physical Therapy

## 2023-09-22 DIAGNOSIS — R262 Difficulty in walking, not elsewhere classified: Secondary | ICD-10-CM | POA: Insufficient documentation

## 2023-09-22 DIAGNOSIS — M6281 Muscle weakness (generalized): Secondary | ICD-10-CM | POA: Diagnosis not present

## 2023-09-22 DIAGNOSIS — M67952 Unspecified disorder of synovium and tendon, left thigh: Secondary | ICD-10-CM

## 2023-09-22 DIAGNOSIS — M25552 Pain in left hip: Secondary | ICD-10-CM | POA: Diagnosis not present

## 2023-09-22 NOTE — Therapy (Signed)
OUTPATIENT PHYSICAL THERAPY TREATMENT   Patient Name: Emily [name] MRN: [id number] DOB:Oct 17, 1940, 83 y.o., female Today's Date: 09/23/2023  END OF SESSION:  PT End of Session - 09/22/23 1243     Visit Number 3    Number of Visits 28    Date for PT Re-Evaluation 12/20/23    Authorization Type HTA, no VL    PT Start Time 1156    PT Stop Time 1235    PT Time Calculation (min) 39 min    Activity Tolerance Patient tolerated treatment well    Behavior During Therapy WFL for tasks assessed/performed               Past Medical History:  Diagnosis Date   Actinic keratosis    Allergic rhinitis    BACK PAIN    CARCINOMA, BREAST, 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   L s/p lumpectomy, chemo and XRT   Cervicalgia    Cholelithiasis    DIVERTICULOSIS, COLON    ENDOMETRIOSIS    Heart palpitations    HYPOTHYROIDISM    Left wrist fracture 11/06/2016   10/2016 - fell of high stool   Osteoporosis    Palpitations    Personal history of chemotherapy    Personal history of radiation therapy    Pleural plaque without asbestos    CT chest 10/2010: R>L lobes, upper> lower   Renal disorder    Squamous cell carcinoma    History of BC   Past Surgical History:  Procedure Laterality Date   BREAST LUMPECTOMY  2001   left   CATARACT EXTRACTION Bilateral 09/2014   both eyes   CHOLECYSTECTOMY  2010   ENDOMETRIAL ABLATION     EXCISION / BIOPSY BREAST / NIPPLE / DUCT Left 2001   GLUTEUS MINIMUS REPAIR Left 09/09/2023   Procedure: LEFT GLUTEUS MAXIMUS TENDON TRANSFER REPAIR;  Surgeon: Huel Cote, MD;  Location: [facility];  Service: Orthopedics;  Laterality: Left;   Patient Active Problem List   Diagnosis Date Noted   Tendinopathy of left gluteus medius 09/09/2023   Acute cystitis 04/16/2023   Change in bowel habits 01/15/2023   Anxiety 01/01/2023   Agatston coronary artery calcium score 22.4  (12/2021) 10/30/2022   Urge incontinence of urine 10/30/2022   Word  finding difficulty 10/30/2022   Chronic pain of left knee 08/21/2022   Lumbar radiculopathy 03/25/2022   Cough 12/27/2021   Sleep difficulties 09/27/2021   Cervicogenic headache 04/10/2021   Elevated blood pressure reading 04/10/2021   Skin abnormality 02/27/2021   Aortic atherosclerosis (HCC) 09/22/2020   Urinary urgency 03/23/2020   CKD (chronic kidney disease) stage 3, GFR 30-59 ml/min (HCC) 03/22/2020   Prediabetes 03/22/2020   Change in stool 11/17/2019   Right-sided chest wall pain 11/17/2019   Urinary retention 09/23/2019   SOB (shortness of breath) 01/15/2019   Upper airway cough syndrome 01/15/2019   Belching 12/14/2018   Reactive airway disease 12/05/2018   Tear of left rotator cuff 10/05/2018   Pre-syncope 05/19/2018   DDD (degenerative disc disease), cervical 01/15/2018   Neuralgia 09/12/2017   Presbycusis of both ears 12/11/2016   Memory difficulties 11/06/2016   IBS (irritable bowel syndrome) 04/16/2016   Hyperlipidemia 09/10/2015   Osteoporosis 09/08/2015   Palpitations    Pleural plaque without asbestos 12/03/2010   Chronic neck pain 12/18/2009   Diverticulosis of colon 03/02/2009   Lumbago 03/11/2008   Actinic keratosis 01/23/2008   Malignant neoplasm of female breast (HCC) 10/12/2007   Hypothyroidism 06/18/2007  Allergic rhinitis 06/11/2007      REFERRING PROVIDER:  Huel Cote, MD     REFERRING DIAG:  [phone] (ICD-10-CM) - Tendinopathy of left gluteus medius    s/p Lt GMR  Rationale for Evaluation and Treatment: Rehabilitation  THERAPY DIAG:  No diagnosis found.  ONSET DATE: DOS 09/09/23   SUBJECTIVE:                                                                                                                                                                                           SUBJECTIVE STATEMENT: The patient has seen the MD. Her C/O at this time is pain in her lower IT band. Overall she feels like she is improving. She is  still using the walker. She tried the cane but had increased pain in her IT band.     Pt is 1 week s/p L hip gluteus maximus tendon transfers and trochanteric bursectomy.  Pt states she can't take pain meds, they make her vomit.  Pt is only taking Tylenol now.  Pt hasn't been doing her HEP much due to her pain.  Pt states she was ok after prior PT treatment.  Pt states MD thought she would be off her walker in 2 weeks.  Pt states she feels better using the walker.   PERTINENT HISTORY:  Urge incontinence, chronic Lt hip & knee pain, OP, bilat occipital neuralgia  PAIN:  Are you having pain? Yes: NPRS scale: 5/10 Pain location: Lt lateral hip and down to knee Pain description: ache, dull  Aggravating factors: constant Relieving factors: meds  PRECAUTIONS:  None  RED FLAGS: None   WEIGHT BEARING RESTRICTIONS:  Yes WBAT  FALLS:  Has patient fallen in last 6 months? No  LIVING ENVIRONMENT: 2 steps to enter home with handrail on Rt when ascending Does not need to go upstairs- bedroom is on the first floor  OCCUPATION:  retired  PLOF:  Independent  PATIENT GOALS:  Walk without pain and limping, stairs, water walking, I have a treadmill at home.    OBJECTIVE:  Note: Objective measures were completed at Evaluation unless otherwise noted.  TREATMENT DATE:  2/3  Manual:: Trigger point release to IT band; review of self release techniques   There-ex: PROM into flexion and IR    PPT x15  SAQ 3x12 first set no weight low RPE reported. 2nd set 1.5  Hip abduction with ball 2x10        Nuro-Re-ed  Standing weight shift 2x10 with education on moving stright across the toe       Observation: Pt has aquacel bandage over incision.  Pt has minimal expected bloody drainage, nothing abnormal.  She has bruising post to bandage.   Pt  performed: Quad sets 2x10 with 5 sec hold SAQ 2x10 Supine Tra contraction--PT educated pt with correct performance and palpation of TrA Ankle pumps x20  Pt received L hip PROM in flexion and abduction per pt and tissue tolerance w/n protocol ranges.  Updated HEP.  Pt received a HEP handout and was instructed in correct form and appropriate frequency.  PT instructed pt she should not have pain with HEP.     PATIENT EDUCATION:  Education details: Anatomy of condition, POC, HEP, exercise form/rationale, protocol restrictions and limitations. Person educated: Patient and Spouse Education method: Explanation, Demonstration, Tactile cues, Verbal cues, and Handouts Education comprehension: verbalized understanding, returned demonstration, verbal cues required, tactile cues required, and needs further education  HOME EXERCISE PROGRAM: [id number]  Updated HEP: - Supine Quadricep Sets  - 2 x daily - 7 x weekly - 1-2 sets - 10 reps - 5 seconds hold - ANKLE PUMPS  - 3 x daily - 7 x weekly - 2-3 sets - 10 reps  ASSESSMENT:  CLINICAL IMPRESSION: The patient tolerated treatment well. Her PROM has improved. She has 90 degrees of flexion at this time. She has been working on her exercises at home, We continue to work on progressing per protocol. We added in adductor squeeze and weight shifting. We will work on gait training as her IT band improves.   REHAB POTENTIAL: Good  CLINICAL DECISION MAKING: Stable/uncomplicated  EVALUATION COMPLEXITY: Low   GOALS: Goals reviewed with patient? Yes  SHORT TERM GOALS: Target date: 10/07/23 (4 wks post op)  Able to demo gait with proper pattern for houshold distances without AD Baseline: Goal status: INITIAL  2.  Hip flexion to 90 deg without pain Baseline:  Goal status: INITIAL  3.  Good quad and glut activation in CKC Baseline:  Goal status: INITIAL   LONG TERM GOALS: Target date: POC Date  Perform stairs step over step without  pain Baseline:  Goal status: INITIAL  2.  MMT 90% of opposite lower extremity Baseline:  Goal status: INITIAL  3.  Ambulate community distances without pain Baseline:  Goal status: INITIAL  4.  Perform proper pelvic control in squat and SLS Baseline:  Goal status: INITIAL    PLAN:  PT FREQUENCY: 1-2x/week  PT DURATION: other: POC date  PLANNED INTERVENTIONS: 97164- PT Re-evaluation, 97110-Therapeutic exercises, 97530- Therapeutic activity, 97112- Neuromuscular re-education, 97535- Self Care, 47829- Manual therapy, [phone]- Gait training, [phone]- Aquatic Therapy, Patient/Family education, Balance training, Stair training, Taping, Dry Needling, Joint mobilization, Spinal mobilization, Scar mobilization, Cryotherapy, and Moist heat.  PLAN FOR NEXT SESSION: per protocol, scheduled to do aquatic PT after incision heals   Audie Clear III PT, DPT 09/23/23 11:47 AM

## 2023-09-22 NOTE — Progress Notes (Signed)
Post Operative Evaluation    Procedure/Date of Surgery: Left hip gluteus maximus tendon transfer 1/21  Interval History:   Presents today 2 weeks status post left hip gluteus maximus tendon transfer overall doing well.  She is just complaining of some IT band symptoms which she has had prior to surgery as well.  She states this is somewhat flared up her IT band and now she is using a walker.  Overall denies any pain in the lateral aspect of the hip   PMH/PSH/Family History/Social History/Meds/Allergies:    Past Medical History:  Diagnosis Date   Actinic keratosis    Allergic rhinitis    BACK PAIN    CARCINOMA, BREAST, 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   L s/p lumpectomy, chemo and XRT   Cervicalgia    Cholelithiasis    DIVERTICULOSIS, COLON    ENDOMETRIOSIS    Heart palpitations    HYPOTHYROIDISM    Left wrist fracture 11/06/2016   10/2016 - fell of high stool   Osteoporosis    Palpitations    Personal history of chemotherapy    Personal history of radiation therapy    Pleural plaque without asbestos    CT chest 10/2010: R>L lobes, upper> lower   Renal disorder    Squamous cell carcinoma    History of BC   Past Surgical History:  Procedure Laterality Date   BREAST LUMPECTOMY  2001   left   CATARACT EXTRACTION Bilateral 09/2014   both eyes   CHOLECYSTECTOMY  2010   ENDOMETRIAL ABLATION     EXCISION / BIOPSY BREAST / NIPPLE / DUCT Left 2001   GLUTEUS MINIMUS REPAIR Left 09/09/2023   Procedure: LEFT GLUTEUS MAXIMUS TENDON TRANSFER REPAIR;  Surgeon: Huel Cote, MD;  Location: [facility];  Service: Orthopedics;  Laterality: Left;   Social History   Socioeconomic History   Marital status: Married    Spouse name: Not on file   Number of children: 2   Years of education: Not on file   Highest education level: Not on file  Occupational History   Occupation: Retired    Comment: Advertising account executive  Tobacco Use    Smoking status: Never   Smokeless tobacco: Never  Vaping Use   Vaping status: Never Used  Substance and Sexual Activity   Alcohol use: Not Currently   Drug use: No   Sexual activity: Not Currently    Birth control/protection: Post-menopausal  Other Topics Concern   Not on file  Social History Narrative   Married, lives in [place] area since 2008 from [place] to be close to dtr   Social Drivers of [employer] Strain: Low Risk  (02/06/2023)   Overall Financial Resource Strain (CARDIA)    Difficulty of Paying Living Expenses: Not hard at all  Food Insecurity: No Food Insecurity (02/06/2023)   Hunger Vital Sign    Worried About Running Out of Food in the Last Year: Never true    Ran Out of Food in the Last Year: Never true  Transportation Needs: No Transportation Needs (02/06/2023)   PRAPARE - Administrator, Civil Service (Medical): No    Lack of Transportation (Non-Medical): No  Physical Activity: Sufficiently Active (02/06/2023)   Exercise Vital Sign    Days of Exercise per Week: 5 days  Minutes of Exercise per Session: 30 min  Stress: No Stress Concern Present (02/06/2023)   [employer] of Occupational Health - Occupational Stress Questionnaire    Feeling of Stress : Not at all  Social Connections: Socially Integrated (02/06/2023)   Social Connection and Isolation Panel [NHANES]    Frequency of Communication with Friends and Family: More than three times a week    Frequency of Social Gatherings with Friends and Family: More than three times a week    Attends Religious Services: More than 4 times per year    Active Member of [employer] or Organizations: Yes    Attends Engineer, structural: More than 4 times per year    Marital Status: Married   Family History  Problem Relation Age of Onset   Dementia Mother    Heart disease Father    Emphysema Father        smoked and worked with asbestos   Colon cancer Neg Hx    Esophageal cancer Neg Hx     Rectal cancer Neg Hx    Migraines Neg Hx    Headache Neg Hx    Allergies  Allergen Reactions   Covid-19 (Mrna) Vaccine Anaphylaxis   Molnupiravir Other (See Comments)    Tongue swelling   Allegra [Fexofenadine]     Didn't agree with Pt    Levaquin [Levofloxacin] Swelling and Other (See Comments)    Hallucinations, swelling of throat   Nexium [Esomeprazole Magnesium]     diarrhea   Prednisone     Facial flushing   Valerian Root Other (See Comments)    Loose stools   Current Outpatient Medications  Medication Sig Dispense Refill   atenolol (TENORMIN) 25 MG tablet TAKE 1 TABLET BY MOUTH EVERY DAY 90 tablet 2   Calcium Carb-Cholecalciferol (CALCIUM 600 + D PO) Take by mouth.     cetirizine-pseudoephedrine (ZYRTEC-D) 5-120 MG tablet Take 1 tablet by mouth 2 (two) times daily as needed for allergies.     chlorpheniramine (CHLOR-TRIMETON) 4 MG tablet Take 4 mg by mouth 2 (two) times daily as needed for allergies. (Patient not taking: Reported on 09/15/2023)     cholecalciferol (VITAMIN D) 25 MCG (1000 UNIT) tablet Take 1,000 Units by mouth daily.     EPINEPHrine 0.3 mg/0.3 mL IJ SOAJ injection Inject 0.3 mg into the muscle as needed for anaphylaxis. 1 each 0   gabapentin (NEURONTIN) 100 MG capsule TAKE 1 CAPSULE (100 MG TOTAL) BY MOUTH AT BEDTIME. TAKE 1 TO 3 CAPSULES AT BEDTIME 30 capsule 3   levothyroxine (SYNTHROID) 75 MCG tablet TAKE 1 TABLET BY MOUTH EVERY DAY 90 tablet 3   ondansetron (ZOFRAN-ODT) 8 MG disintegrating tablet Take 1 tablet (8 mg total) by mouth every 8 (eight) hours as needed for nausea or vomiting. 10 tablet 0   oxyCODONE (ROXICODONE) 5 MG immediate release tablet Take 1 tablet (5 mg total) by mouth every 4 (four) hours as needed for severe pain (pain score 7-10) or breakthrough pain. (Patient not taking: Reported on 09/15/2023) 30 tablet 0   Current Facility-Administered Medications  Medication Dose Route Frequency Provider Last Rate Last Admin   [START ON  02/02/2024] denosumab (PROLIA) injection 60 mg  60 mg Subcutaneous Once Pincus Sanes, MD       No results found.  Review of Systems:   A ROS was performed including pertinent positives and negatives as documented in the HPI.   Musculoskeletal Exam:    There were no vitals taken for  this visit.  Left hip incision is well-appearing without erythema or drainage.  Tenderness over the IT band distally as it runs to the knee.  30 degrees internal/external rotation of the left hip without pain.  Distal neurosensory exam is intact  Imaging:      I personally reviewed and interpreted the radiographs.   Assessment:   2-week status post left hip gluteus maximus tendon transfer overall doing well.  At this time she will continue to advance her weightbearing.  I would like her to specifically work on her IT band with physical therapy.  I will plan to see her back in 4 weeks for reassessment  Plan :    -Return to clinic in 4 weeks for reassessment      I personally saw and evaluated the patient, and participated in the management and treatment plan.  Huel Cote, MD Attending Physician, Orthopedic Surgery  This document was dictated using Dragon voice recognition software. A reasonable attempt at proof reading has been made to minimize errors.

## 2023-09-23 ENCOUNTER — Encounter (HOSPITAL_BASED_OUTPATIENT_CLINIC_OR_DEPARTMENT_OTHER): Payer: Medicare HMO

## 2023-09-23 ENCOUNTER — Encounter (HOSPITAL_BASED_OUTPATIENT_CLINIC_OR_DEPARTMENT_OTHER): Payer: Self-pay | Admitting: Physical Therapy

## 2023-09-26 ENCOUNTER — Encounter (HOSPITAL_BASED_OUTPATIENT_CLINIC_OR_DEPARTMENT_OTHER): Payer: Medicare HMO | Admitting: Physical Therapy

## 2023-09-28 NOTE — Therapy (Signed)
 OUTPATIENT PHYSICAL THERAPY TREATMENT   Patient Name: Emily [name] MRN: [id number] DOB:05-02-1941, 83 y.o., female Today's Date: 09/29/2023  END OF SESSION:  PT End of Session - 09/29/23 1204     Visit Number 4    Number of Visits 28    Date for PT Re-Evaluation 12/20/23    PT Start Time 1157    PT Stop Time 1233    PT Time Calculation (min) 36 min    Activity Tolerance Patient tolerated treatment well    Behavior During Therapy WFL for tasks assessed/performed                Past Medical History:  Diagnosis Date   Actinic keratosis    Allergic rhinitis    BACK PAIN    CARCINOMA, BREAST, 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   L s/p lumpectomy, chemo and XRT   Cervicalgia    Cholelithiasis    DIVERTICULOSIS, COLON    ENDOMETRIOSIS    Heart palpitations    HYPOTHYROIDISM    Left wrist fracture 11/06/2016   10/2016 - fell of high stool   Osteoporosis    Palpitations    Personal history of chemotherapy    Personal history of radiation therapy    Pleural plaque without asbestos    CT chest 10/2010: R>L lobes, upper> lower   Renal disorder    Squamous cell carcinoma    History of BC   Past Surgical History:  Procedure Laterality Date   BREAST LUMPECTOMY  2001   left   CATARACT EXTRACTION Bilateral 09/2014   both eyes   CHOLECYSTECTOMY  2010   ENDOMETRIAL ABLATION     EXCISION / BIOPSY BREAST / NIPPLE / DUCT Left 2001   GLUTEUS MINIMUS REPAIR Left 09/09/2023   Procedure: LEFT GLUTEUS MAXIMUS TENDON TRANSFER REPAIR;  Surgeon: Wilhelmenia Harada, MD;  Location: [facility];  Service: Orthopedics;  Laterality: Left;   Patient Active Problem List   Diagnosis Date Noted   Tendinopathy of left gluteus medius 09/09/2023   Acute cystitis 04/16/2023   Change in bowel habits 01/15/2023   Anxiety 01/01/2023   Agatston coronary artery calcium score 22.4  (12/2021) 10/30/2022   Urge incontinence of urine 10/30/2022   Word finding difficulty 10/30/2022    Chronic pain of left knee 08/21/2022   Lumbar radiculopathy 03/25/2022   Cough 12/27/2021   Sleep difficulties 09/27/2021   Cervicogenic headache 04/10/2021   Elevated blood pressure reading 04/10/2021   Skin abnormality 02/27/2021   Aortic atherosclerosis (HCC) 09/22/2020   Urinary urgency 03/23/2020   CKD (chronic kidney disease) stage 3, GFR 30-59 ml/min (HCC) 03/22/2020   Prediabetes 03/22/2020   Change in stool 11/17/2019   Right-sided chest wall pain 11/17/2019   Urinary retention 09/23/2019   SOB (shortness of breath) 01/15/2019   Upper airway cough syndrome 01/15/2019   Belching 12/14/2018   Reactive airway disease 12/05/2018   Tear of left rotator cuff 10/05/2018   Pre-syncope 05/19/2018   DDD (degenerative disc disease), cervical 01/15/2018   Neuralgia 09/12/2017   Presbycusis of both ears 12/11/2016   Memory difficulties 11/06/2016   IBS (irritable bowel syndrome) 04/16/2016   Hyperlipidemia 09/10/2015   Osteoporosis 09/08/2015   Palpitations    Pleural plaque without asbestos 12/03/2010   Chronic neck pain 12/18/2009   Diverticulosis of colon 03/02/2009   Lumbago 03/11/2008   Actinic keratosis 01/23/2008   Malignant neoplasm of female breast (HCC) 10/12/2007   Hypothyroidism 06/18/2007   Allergic rhinitis 06/11/2007  REFERRING PROVIDER:  Wilhelmenia Harada, MD     REFERRING DIAG:  [phone] (ICD-10-CM) - Tendinopathy of left gluteus medius    s/p Lt GMR  Rationale for Evaluation and Treatment: Rehabilitation  THERAPY DIAG:  Pain in left hip  Difficulty in walking, not elsewhere classified  Muscle weakness (generalized)  ONSET DATE: DOS 09/09/23   SUBJECTIVE:                                                                                                                                                                                           SUBJECTIVE STATEMENT: Pt is 2 weeks and 6 days s/p L hip gluteus maximus tendon transfers and  trochanteric bursectomy.  Pt denies any adverse effects after prior Rx.  Pt states she is having pain with HEP and is unable to complete HEP.  She is still having pain in her IT band.  Pt reports having pain at the incision site and mid to distal ITB. Pt has pain occasionally shoot past the need to distal anterolateral shin.  Pt states she started using the cane today.     PERTINENT HISTORY:  Urge incontinence, chronic Lt hip & knee pain, OP, bilat occipital neuralgia  PAIN:  Are you having pain? Yes: NPRS scale: 1-2/10 Pain location: Lt lateral hip and down to knee Pain description: ache, dull  Aggravating factors: constant Relieving factors: meds  PRECAUTIONS:  None  RED FLAGS: None   WEIGHT BEARING RESTRICTIONS:  Yes WBAT  FALLS:  Has patient fallen in last 6 months? No  LIVING ENVIRONMENT: 2 steps to enter home with handrail on Rt when ascending Does not need to go upstairs- bedroom is on the first floor  OCCUPATION:  retired  PLOF:  Independent  PATIENT GOALS:  Walk without pain and limping, stairs, water walking, I have a treadmill at home.    OBJECTIVE:  Note: Objective measures were completed at Evaluation unless otherwise noted.  TREATMENT DATE:  2/10 Gait:  Pt ambulates with cane well with good sequencing without limping.  Ther Ex: SAQ 1x12, 2x10 Supine Tra contraction with 2-3 sec hold.  PT educated pt with correct performance and palpation of TrA. Supine PPT 2x10 Hip adductor squeeze with ball 2x10 PT reviewed form with quad sets and pt performed with 5 sec hold  Pt received L hip PROM in flexion and abduction per pt and tissue tolerance w/n protocol ranges.  Manual: Trigger point release to distal IT band in supine.  2/3  Manual:: Trigger point release to IT band; review of self release techniques   There-ex: PROM  into flexion and IR    PPT x15  SAQ 3x12 first set no weight low RPE reported. 2nd set 1.5  Hip abduction with ball 2x10      Nuro-Re-ed  Standing weight shift 2x10 with education on moving stright across the toe       Observation: Pt has aquacel bandage over incision.  Pt has minimal expected bloody drainage, nothing abnormal.  She has bruising post to bandage.   Pt performed: Quad sets 2x10 with 5 sec hold SAQ 2x10 Supine Tra contraction--PT educated pt with correct performance and palpation of TrA Ankle pumps x20  Pt received L hip PROM in flexion and abduction per pt and tissue tolerance w/n protocol ranges.  Updated HEP.  Pt received a HEP handout and was instructed in correct form and appropriate frequency.  PT instructed pt she should not have pain with HEP.     PATIENT EDUCATION:  Education details: Anatomy of condition, POC, HEP, exercise form/rationale, protocol restrictions and limitations. Person educated: Patient and Spouse Education method: Explanation, Demonstration, Tactile cues, Verbal cues, and Handouts Education comprehension: verbalized understanding, returned demonstration, verbal cues required, tactile cues required, and needs further education  HOME EXERCISE PROGRAM: [id number]   ASSESSMENT:  CLINICAL IMPRESSION: Pt presents to clinic with cane and is ambulating well with cane.  Pt states she is having pain with HEP and unable to perform some of her HEP.  PT reviewed exercises and correct form.  PT instructed pt in performing SAQ and quad set correctly and she had no pain in the clinic performing those exercises.  Pt performed exercises per protocol well without c/o's.  PT performed hip PROM per protocol ranges and she tolerated PROM well.  Pt continues to report pain in ITB and she has tenderness in distal ITB.  Pt responded well to Rx stating she felt better after Rx than compared to when she came in.  She states she feels good and has no pain  after Rx.  Pt should cont to benefit from cont skilled PT per protocol to address goals and impairments and improve overall function.    REHAB POTENTIAL: Good  CLINICAL DECISION MAKING: Stable/uncomplicated  EVALUATION COMPLEXITY: Low   GOALS: Goals reviewed with patient? Yes  SHORT TERM GOALS: Target date: 10/07/23 (4 wks post op)  Able to demo gait with proper pattern for houshold distances without AD Baseline: Goal status: INITIAL  2.  Hip flexion to 90 deg without pain Baseline:  Goal status: INITIAL  3.  Good quad and glut activation in CKC Baseline:  Goal status: INITIAL   LONG TERM GOALS: Target date: POC Date  Perform stairs step over step without pain Baseline:  Goal status: INITIAL  2.  MMT 90% of opposite lower extremity Baseline:  Goal status: INITIAL  3.  Ambulate community distances without pain Baseline:  Goal  status: INITIAL  4.  Perform proper pelvic control in squat and SLS Baseline:  Goal status: INITIAL    PLAN:  PT FREQUENCY: 1-2x/week  PT DURATION: other: POC date  PLANNED INTERVENTIONS: 97164- PT Re-evaluation, 97110-Therapeutic exercises, 97530- Therapeutic activity, 97112- Neuromuscular re-education, 97535- Self Care, 04540- Manual therapy, [phone]- Gait training, [phone]- Aquatic Therapy, Patient/Family education, Balance training, Stair training, Taping, Dry Needling, Joint mobilization, Spinal mobilization, Scar mobilization, Cryotherapy, and Moist heat.  PLAN FOR NEXT SESSION: per protocol, scheduled to do aquatic PT after incision heals   Trina Fujita III PT, DPT 09/29/23 1:58 PM

## 2023-09-29 ENCOUNTER — Ambulatory Visit (HOSPITAL_BASED_OUTPATIENT_CLINIC_OR_DEPARTMENT_OTHER): Payer: PPO | Admitting: Physical Therapy

## 2023-09-29 ENCOUNTER — Encounter (HOSPITAL_BASED_OUTPATIENT_CLINIC_OR_DEPARTMENT_OTHER): Payer: Self-pay | Admitting: Physical Therapy

## 2023-09-29 DIAGNOSIS — M25552 Pain in left hip: Secondary | ICD-10-CM | POA: Diagnosis not present

## 2023-09-29 DIAGNOSIS — R262 Difficulty in walking, not elsewhere classified: Secondary | ICD-10-CM

## 2023-09-29 DIAGNOSIS — M6281 Muscle weakness (generalized): Secondary | ICD-10-CM

## 2023-09-30 ENCOUNTER — Encounter (HOSPITAL_BASED_OUTPATIENT_CLINIC_OR_DEPARTMENT_OTHER): Payer: Medicare HMO

## 2023-10-03 ENCOUNTER — Encounter (HOSPITAL_BASED_OUTPATIENT_CLINIC_OR_DEPARTMENT_OTHER): Payer: Medicare HMO | Admitting: Physical Therapy

## 2023-10-05 NOTE — Therapy (Signed)
OUTPATIENT PHYSICAL THERAPY TREATMENT   Patient Name: Emily [name] MRN: [id number] DOB:27-Dec-1940, 83 y.o., female Today's Date: 10/06/2023  END OF SESSION:  PT End of Session - 10/06/23 1321     Visit Number 5    Number of Visits 28    Date for PT Re-Evaluation 12/20/23    PT Start Time 1320    PT Stop Time 1405    PT Time Calculation (min) 45 min    Activity Tolerance Patient tolerated treatment well    Behavior During Therapy WFL for tasks assessed/performed                 Past Medical History:  Diagnosis Date   Actinic keratosis    Allergic rhinitis    BACK PAIN    CARCINOMA, BREAST, 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   L s/p lumpectomy, chemo and XRT   Cervicalgia    Cholelithiasis    DIVERTICULOSIS, COLON    ENDOMETRIOSIS    Heart palpitations    HYPOTHYROIDISM    Left wrist fracture 11/06/2016   10/2016 - fell of high stool   Osteoporosis    Palpitations    Personal history of chemotherapy    Personal history of radiation therapy    Pleural plaque without asbestos    CT chest 10/2010: R>L lobes, upper> lower   Renal disorder    Squamous cell carcinoma    History of BC   Past Surgical History:  Procedure Laterality Date   BREAST LUMPECTOMY  2001   left   CATARACT EXTRACTION Bilateral 09/2014   both eyes   CHOLECYSTECTOMY  2010   ENDOMETRIAL ABLATION     EXCISION / BIOPSY BREAST / NIPPLE / DUCT Left 2001   GLUTEUS MINIMUS REPAIR Left 09/09/2023   Procedure: LEFT GLUTEUS MAXIMUS TENDON TRANSFER REPAIR;  Surgeon: Huel Cote, MD;  Location: [facility];  Service: Orthopedics;  Laterality: Left;   Patient Active Problem List   Diagnosis Date Noted   Tendinopathy of left gluteus medius 09/09/2023   Acute cystitis 04/16/2023   Change in bowel habits 01/15/2023   Anxiety 01/01/2023   Agatston coronary artery calcium score 22.4  (12/2021) 10/30/2022   Urge incontinence of urine 10/30/2022   Word finding difficulty 10/30/2022    Chronic pain of left knee 08/21/2022   Lumbar radiculopathy 03/25/2022   Cough 12/27/2021   Sleep difficulties 09/27/2021   Cervicogenic headache 04/10/2021   Elevated blood pressure reading 04/10/2021   Skin abnormality 02/27/2021   Aortic atherosclerosis (HCC) 09/22/2020   Urinary urgency 03/23/2020   CKD (chronic kidney disease) stage 3, GFR 30-59 ml/min (HCC) 03/22/2020   Prediabetes 03/22/2020   Change in stool 11/17/2019   Right-sided chest wall pain 11/17/2019   Urinary retention 09/23/2019   SOB (shortness of breath) 01/15/2019   Upper airway cough syndrome 01/15/2019   Belching 12/14/2018   Reactive airway disease 12/05/2018   Tear of left rotator cuff 10/05/2018   Pre-syncope 05/19/2018   DDD (degenerative disc disease), cervical 01/15/2018   Neuralgia 09/12/2017   Presbycusis of both ears 12/11/2016   Memory difficulties 11/06/2016   IBS (irritable bowel syndrome) 04/16/2016   Hyperlipidemia 09/10/2015   Osteoporosis 09/08/2015   Palpitations    Pleural plaque without asbestos 12/03/2010   Chronic neck pain 12/18/2009   Diverticulosis of colon 03/02/2009   Lumbago 03/11/2008   Actinic keratosis 01/23/2008   Malignant neoplasm of female breast (HCC) 10/12/2007   Hypothyroidism 06/18/2007   Allergic rhinitis 06/11/2007  REFERRING PROVIDER:  Huel Cote, MD     REFERRING DIAG:  [phone] (ICD-10-CM) - Tendinopathy of left gluteus medius    s/p Lt GMR  Rationale for Evaluation and Treatment: Rehabilitation  THERAPY DIAG:  Pain in left hip  Difficulty in walking, not elsewhere classified  Muscle weakness (generalized)  ONSET DATE: DOS 09/09/23   SUBJECTIVE:                                                                                                                                                                                           SUBJECTIVE STATEMENT: Pt is 3 weeks and 6 days s/p L hip gluteus maximus tendon transfers and  trochanteric bursectomy.  Pt denies any adverse effects after prior Rx.   Pt states she is still having ITB pain which is really bothering her.  The ITB has limited her HEP.  Pt reports having ITB issues last year and received PT for it.  Pt reports her ITB responded well to PT last year.  Pt states she is unable to take pain meds due to making her sick.  She occasionally takes tylenol.  Pt does take Gabapentin which is for another issue.  Pt presents to Rx with the cane though states she has been alternating walker and cane due to some pain with using the cane.     PERTINENT HISTORY:  Urge incontinence, chronic Lt hip & knee pain, OP, bilat occipital neuralgia  PAIN:  Are you having pain? Yes: NPRS scale: 0/10 L hip nad 4-5/10 at distal ITB/10 Pain location: see above Pain description: ache, dull  Aggravating factors: constant Relieving factors: meds  PRECAUTIONS:  None  RED FLAGS: None   WEIGHT BEARING RESTRICTIONS:  Yes WBAT  FALLS:  Has patient fallen in last 6 months? No  LIVING ENVIRONMENT: 2 steps to enter home with handrail on Rt when ascending Does not need to go upstairs- bedroom is on the first floor  OCCUPATION:  retired  PLOF:  Independent  PATIENT GOALS:  Walk without pain and limping, stairs, water walking, I have a treadmill at home.    OBJECTIVE:  Note: Objective measures were completed at Evaluation unless otherwise noted.  TREATMENT DATE:  2/17 Therapeutic Exercise: Reviewed pt presentation, pain level, response to prior Rx, and HEP.   Upright bike x 3 mins lvl 0 w/n protocol ranges  Pt received L hip flexion PROM in flexion and abd in supine per pt and tissue tolerance w/n protocol range  Supine Quad sets 2x10 reps with 5 sec hold (5 reps of last set with towel roll under knee) SAQ 2x12 TrA contraction with 5 sec  hold in hooklying PPT in hooklying 2x10  See below for pt education  Manual: Trigger point release to distal IT band in supine.   2/10 Gait:  Pt ambulates with cane well with good sequencing without limping.  Ther Ex: SAQ 1x12, 2x10 Supine Tra contraction with 2-3 sec hold.  PT educated pt with correct performance and palpation of TrA. Supine PPT 2x10 Hip adductor squeeze with ball 2x10 PT reviewed form with quad sets and pt performed with 5 sec hold  Pt received L hip PROM in flexion and abduction per pt and tissue tolerance w/n protocol ranges.  Manual: Trigger point release to distal IT band in supine.  2/3  Manual:: Trigger point release to IT band; review of self release techniques   There-ex: PROM into flexion and IR    PPT x15  SAQ 3x12 first set no weight low RPE reported. 2nd set 1.5  Hip abduction with ball 2x10      Nuro-Re-ed  Standing weight shift 2x10 with education on moving stright across the toe       Observation: Pt has aquacel bandage over incision.  Pt has minimal expected bloody drainage, nothing abnormal.  She has bruising post to bandage.   Pt performed: Quad sets 2x10 with 5 sec hold SAQ 2x10 Supine Tra contraction--PT educated pt with correct performance and palpation of TrA Ankle pumps x20  Pt received L hip PROM in flexion and abduction per pt and tissue tolerance w/n protocol ranges.  Updated HEP.  Pt received a HEP handout and was instructed in correct form and appropriate frequency.  PT instructed pt she should not have pain with HEP.     PATIENT EDUCATION:  Education details:  Relevant anatomy, POC, HEP, exercise form/rationale, protocol restrictions and limitations.  PT answered pt's questions.  Person educated: Patient Education method: Explanation, Demonstration, Tactile cues, Verbal cues, and Handouts Education comprehension: verbalized understanding, returned demonstration, verbal cues required, tactile cues required,  and needs further education  HOME EXERCISE PROGRAM: [id number]   ASSESSMENT:  CLINICAL IMPRESSION: Pt presents to clinic with cane and is ambulating well with cane.  Pt states she is having pain with HEP and unable to perform some of her HEP.  PT reviewed exercises and correct form.  PT instructed pt in performing SAQ and quad set correctly and she had no pain in the clinic performing those exercises.  Pt performed exercises per protocol well without c/o's.  PT performed hip PROM per protocol ranges and she tolerated PROM well.  Pt continues to report pain in ITB and she has tenderness in distal ITB.  Pt responded well to Rx stating she felt better after Rx than compared to when she came in.  She states she feels good and has no pain after Rx.  Pt should cont to benefit from cont skilled PT per protocol to address goals and impairments and improve overall function.   Pt presents to Rx with the cane though states she has been alternating walker and cane due to some pain with using  the cane. PT educated pt in using a towel roll under knee if she is having pain to determine if that would improve her comfort. No pain in hip and ITB feels better.      REHAB POTENTIAL: Good  CLINICAL DECISION MAKING: Stable/uncomplicated  EVALUATION COMPLEXITY: Low   GOALS: Goals reviewed with patient? Yes  SHORT TERM GOALS: Target date: 10/07/23 (4 wks post op)  Able to demo gait with proper pattern for houshold distances without AD Baseline: Goal status: INITIAL  2.  Hip flexion to 90 deg without pain Baseline:  Goal status: INITIAL  3.  Good quad and glut activation in CKC Baseline:  Goal status: INITIAL   LONG TERM GOALS: Target date: POC Date  Perform stairs step over step without pain Baseline:  Goal status: INITIAL  2.  MMT 90% of opposite lower extremity Baseline:  Goal status: INITIAL  3.  Ambulate community distances without pain Baseline:  Goal status: INITIAL  4.  Perform  proper pelvic control in squat and SLS Baseline:  Goal status: INITIAL    PLAN:  PT FREQUENCY: 1-2x/week  PT DURATION: other: POC date  PLANNED INTERVENTIONS: 97164- PT Re-evaluation, 97110-Therapeutic exercises, 97530- Therapeutic activity, 97112- Neuromuscular re-education, 97535- Self Care, [id number]- Manual therapy, [phone]- Gait training, [phone]- Aquatic Therapy, Patient/Family education, Balance training, Stair training, Taping, Dry Needling, Joint mobilization, Spinal mobilization, Scar mobilization, Cryotherapy, and Moist heat.  PLAN FOR NEXT SESSION: per protocol, scheduled to do aquatic PT after incision heals   Audie Clear III PT, DPT 10/06/23 3:23 PM

## 2023-10-06 ENCOUNTER — Ambulatory Visit (HOSPITAL_BASED_OUTPATIENT_CLINIC_OR_DEPARTMENT_OTHER): Payer: PPO | Admitting: Physical Therapy

## 2023-10-06 ENCOUNTER — Encounter (HOSPITAL_BASED_OUTPATIENT_CLINIC_OR_DEPARTMENT_OTHER): Payer: Self-pay | Admitting: Physical Therapy

## 2023-10-06 DIAGNOSIS — M6281 Muscle weakness (generalized): Secondary | ICD-10-CM

## 2023-10-06 DIAGNOSIS — M25552 Pain in left hip: Secondary | ICD-10-CM | POA: Diagnosis not present

## 2023-10-06 DIAGNOSIS — R262 Difficulty in walking, not elsewhere classified: Secondary | ICD-10-CM

## 2023-10-09 ENCOUNTER — Encounter (HOSPITAL_BASED_OUTPATIENT_CLINIC_OR_DEPARTMENT_OTHER): Payer: Self-pay

## 2023-10-09 ENCOUNTER — Ambulatory Visit (HOSPITAL_BASED_OUTPATIENT_CLINIC_OR_DEPARTMENT_OTHER): Payer: PPO

## 2023-10-09 DIAGNOSIS — M25552 Pain in left hip: Secondary | ICD-10-CM | POA: Diagnosis not present

## 2023-10-09 DIAGNOSIS — R262 Difficulty in walking, not elsewhere classified: Secondary | ICD-10-CM

## 2023-10-09 DIAGNOSIS — M6281 Muscle weakness (generalized): Secondary | ICD-10-CM

## 2023-10-09 NOTE — Therapy (Signed)
OUTPATIENT PHYSICAL THERAPY TREATMENT   Patient Name: Emily [name] MRN: [id number] DOB:04-24-1941, 83 y.o., female Today's Date: 10/09/2023  END OF SESSION:  PT End of Session - 10/09/23 1322     Visit Number 6    Number of Visits 28    Date for PT Re-Evaluation 12/20/23    Authorization Type HTA, no VL    PT Start Time 1302    PT Stop Time 1345    PT Time Calculation (min) 43 min    Activity Tolerance Patient tolerated treatment well    Behavior During Therapy WFL for tasks assessed/performed                  Past Medical History:  Diagnosis Date   Actinic keratosis    Allergic rhinitis    BACK PAIN    CARCINOMA, BREAST, 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   L s/p lumpectomy, chemo and XRT   Cervicalgia    Cholelithiasis    DIVERTICULOSIS, COLON    ENDOMETRIOSIS    Heart palpitations    HYPOTHYROIDISM    Left wrist fracture 11/06/2016   10/2016 - fell of high stool   Osteoporosis    Palpitations    Personal history of chemotherapy    Personal history of radiation therapy    Pleural plaque without asbestos    CT chest 10/2010: R>L lobes, upper> lower   Renal disorder    Squamous cell carcinoma    History of BC   Past Surgical History:  Procedure Laterality Date   BREAST LUMPECTOMY  2001   left   CATARACT EXTRACTION Bilateral 09/2014   both eyes   CHOLECYSTECTOMY  2010   ENDOMETRIAL ABLATION     EXCISION / BIOPSY BREAST / NIPPLE / DUCT Left 2001   GLUTEUS MINIMUS REPAIR Left 09/09/2023   Procedure: LEFT GLUTEUS MAXIMUS TENDON TRANSFER REPAIR;  Surgeon: Huel Cote, MD;  Location: [facility];  Service: Orthopedics;  Laterality: Left;   Patient Active Problem List   Diagnosis Date Noted   Tendinopathy of left gluteus medius 09/09/2023   Acute cystitis 04/16/2023   Change in bowel habits 01/15/2023   Anxiety 01/01/2023   Agatston coronary artery calcium score 22.4  (12/2021) 10/30/2022   Urge incontinence of urine 10/30/2022    Word finding difficulty 10/30/2022   Chronic pain of left knee 08/21/2022   Lumbar radiculopathy 03/25/2022   Cough 12/27/2021   Sleep difficulties 09/27/2021   Cervicogenic headache 04/10/2021   Elevated blood pressure reading 04/10/2021   Skin abnormality 02/27/2021   Aortic atherosclerosis (HCC) 09/22/2020   Urinary urgency 03/23/2020   CKD (chronic kidney disease) stage 3, GFR 30-59 ml/min (HCC) 03/22/2020   Prediabetes 03/22/2020   Change in stool 11/17/2019   Right-sided chest wall pain 11/17/2019   Urinary retention 09/23/2019   SOB (shortness of breath) 01/15/2019   Upper airway cough syndrome 01/15/2019   Belching 12/14/2018   Reactive airway disease 12/05/2018   Tear of left rotator cuff 10/05/2018   Pre-syncope 05/19/2018   DDD (degenerative disc disease), cervical 01/15/2018   Neuralgia 09/12/2017   Presbycusis of both ears 12/11/2016   Memory difficulties 11/06/2016   IBS (irritable bowel syndrome) 04/16/2016   Hyperlipidemia 09/10/2015   Osteoporosis 09/08/2015   Palpitations    Pleural plaque without asbestos 12/03/2010   Chronic neck pain 12/18/2009   Diverticulosis of colon 03/02/2009   Lumbago 03/11/2008   Actinic keratosis 01/23/2008   Malignant neoplasm of female breast (HCC) 10/12/2007  Hypothyroidism 06/18/2007   Allergic rhinitis 06/11/2007      REFERRING PROVIDER:  Huel Cote, MD     REFERRING DIAG:  [id number] (ICD-10-CM) - Tendinopathy of left gluteus medius    s/p Lt GMR  Rationale for Evaluation and Treatment: Rehabilitation  THERAPY DIAG:  Pain in left hip  Difficulty in walking, not elsewhere classified  Muscle weakness (generalized)  ONSET DATE: DOS 09/09/23   SUBJECTIVE:                                                                                                                                                                                           SUBJECTIVE STATEMENT: Pt is 3 weeks and 6 days s/p L hip gluteus  maximus tendon transfers and trochanteric bursectomy.  Pt denies any adverse effects after prior Rx.   Pt states she is still having ITB pain which is really bothering her.  The ITB has limited her HEP.  Pt reports having ITB issues last year and received PT for it.  Pt reports her ITB responded well to PT last year.  Pt states she is unable to take pain meds due to making her sick.  She occasionally takes tylenol.  Pt does take Gabapentin which is for another issue.  Pt presents to Rx with the cane though states she has been alternating walker and cane due to some pain with using the cane.     PERTINENT HISTORY:  Urge incontinence, chronic Lt hip & knee pain, OP, bilat occipital neuralgia  PAIN:  Are you having pain? Yes: NPRS scale: 0/10 L hip nad 4-5/10 at distal ITB/10 Pain location: see above Pain description: ache, dull  Aggravating factors: constant Relieving factors: meds  PRECAUTIONS:  None  RED FLAGS: None   WEIGHT BEARING RESTRICTIONS:  Yes WBAT  FALLS:  Has patient fallen in last 6 months? No  LIVING ENVIRONMENT: 2 steps to enter home with handrail on Rt when ascending Does not need to go upstairs- bedroom is on the first floor  OCCUPATION:  retired  PLOF:  Independent  PATIENT GOALS:  Walk without pain and limping, stairs, water walking, I have a treadmill at home.    OBJECTIVE:  Note: Objective measures were completed at Evaluation unless otherwise noted.  TREATMENT DATE:   2/20 Therapeutic Exercise:  Upright bike x 3 mins lvl 0 w/n protocol ranges   Hooklying adductor sqz 5" 2x10 Bridge 2x10 Isometric hip flexion in hooklying 3" x10 Isometric hip abduction in hooklying (with gaitbelt) 5" x10 LAQ 1.3# 5" 2x10   Manual:  L hip PROM flexion, abduction, and IR STM to distal IT band in supine.     2/17 Therapeutic  Exercise: Reviewed pt presentation, pain level, response to prior Rx, and HEP.   Upright bike x 3 mins lvl 0 w/n protocol ranges  Pt received L hip flexion PROM in flexion and abd in supine per pt and tissue tolerance w/n protocol range  Supine Quad sets 2x10 reps with 5 sec hold (5 reps of last set with towel roll under knee) SAQ 2x12 TrA contraction with 5 sec hold in hooklying PPT in hooklying 2x10  See below for pt education  Manual: STM/Trigger point release to distal IT band in supine.   2/10 Gait:  Pt ambulates with cane well with good sequencing without limping.  Ther Ex: SAQ 1x12, 2x10 Supine Tra contraction with 2-3 sec hold.  PT educated pt with correct performance and palpation of TrA. Supine PPT 2x10 Hip adductor squeeze with ball 2x10 PT reviewed form with quad sets and pt performed with 5 sec hold  Pt received L hip PROM in flexion and abduction per pt and tissue tolerance w/n protocol ranges.  Manual: Trigger point release to distal IT band in supine.  2/3  Manual:: Trigger point release to IT band; review of self release techniques   There-ex: PROM into flexion and IR    PPT x15  SAQ 3x12 first set no weight low RPE reported. 2nd set 1.5  Hip abduction with ball 2x10      Nuro-Re-ed  Standing weight shift 2x10 with education on moving stright across the toe     PATIENT EDUCATION:  Education details:  Relevant anatomy, POC, HEP, exercise form/rationale, protocol restrictions and limitations.  PT answered pt's questions.  Person educated: Patient Education method: Explanation, Demonstration, Tactile cues, Verbal cues, and Handouts Education comprehension: verbalized understanding, returned demonstration, verbal cues required, tactile cues required, and needs further education  HOME EXERCISE PROGRAM: [email or web address]   ASSESSMENT:  CLINICAL IMPRESSION: Continued with gentle STM to ITB and lateral HS. She remains tender throughout lateral thigh.  Trialed 2.5# weight with LAQ, which was too heavy for pt, so decreased to 1.5# with improved performance. Initiated hip flexor and abductor isometrics without complaint of increased pain. Will continue to progress as tolerated.     REHAB POTENTIAL: Good  CLINICAL DECISION MAKING: Stable/uncomplicated  EVALUATION COMPLEXITY: Low   GOALS: Goals reviewed with patient? Yes  SHORT TERM GOALS: Target date: 10/07/23 (4 wks post op)  Able to demo gait with proper pattern for houshold distances without AD Baseline: Goal status: INITIAL  2.  Hip flexion to 90 deg without pain Baseline:  Goal status: INITIAL  3.  Good quad and glut activation in CKC Baseline:  Goal status: INITIAL   LONG TERM GOALS: Target date: POC Date  Perform stairs step over step without pain Baseline:  Goal status: INITIAL  2.  MMT 90% of opposite lower extremity Baseline:  Goal status: INITIAL  3.  Ambulate community distances without pain Baseline:  Goal status: INITIAL  4.  Perform proper pelvic control in squat and SLS Baseline:  Goal status: INITIAL    PLAN:  PT FREQUENCY: 1-2x/week  PT DURATION: other:  POC date  PLANNED INTERVENTIONS: 97164- PT Re-evaluation, 97110-Therapeutic exercises, 97530- Therapeutic activity, 97112- Neuromuscular re-education, [phone]- Self Care, 28413- Manual therapy, [phone]- Gait training, [phone]- Aquatic Therapy, Patient/Family education, Balance training, Stair training, Taping, Dry Needling, Joint mobilization, Spinal mobilization, Scar mobilization, Cryotherapy, and Moist heat.  PLAN FOR NEXT SESSION: per protocol, scheduled to do aquatic PT after incision heals    Lenore Manner, PTA 10/09/2023, 2:17 PM

## 2023-10-12 NOTE — Therapy (Signed)
 OUTPATIENT PHYSICAL THERAPY TREATMENT   Patient Name: Emily [name] MRN: [id number] DOB:1941/02/08, 83 y.o., female Today's Date: 10/14/2023  END OF SESSION:  PT End of Session - 10/13/23 1200     Visit Number 7    Number of Visits 28    Date for PT Re-Evaluation 12/20/23    Authorization Type HTA, no VL    PT Start Time 1153    PT Stop Time 1233    PT Time Calculation (min) 40 min    Activity Tolerance Patient limited by pain    Behavior During Therapy WFL for tasks assessed/performed                   Past Medical History:  Diagnosis Date   Actinic keratosis    Allergic rhinitis    BACK PAIN    CARCINOMA, BREAST, 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   L s/p lumpectomy, chemo and XRT   Cervicalgia    Cholelithiasis    DIVERTICULOSIS, COLON    ENDOMETRIOSIS    Heart palpitations    HYPOTHYROIDISM    Left wrist fracture 11/06/2016   10/2016 - fell of high stool   Osteoporosis    Palpitations    Personal history of chemotherapy    Personal history of radiation therapy    Pleural plaque without asbestos    CT chest 10/2010: R>L lobes, upper> lower   Renal disorder    Squamous cell carcinoma    History of BC   Past Surgical History:  Procedure Laterality Date   BREAST LUMPECTOMY  2001   left   CATARACT EXTRACTION Bilateral 09/2014   both eyes   CHOLECYSTECTOMY  2010   ENDOMETRIAL ABLATION     EXCISION / BIOPSY BREAST / NIPPLE / DUCT Left 2001   GLUTEUS MINIMUS REPAIR Left 09/09/2023   Procedure: LEFT GLUTEUS MAXIMUS TENDON TRANSFER REPAIR;  Surgeon: Huel Cote, MD;  Location: [facility];  Service: Orthopedics;  Laterality: Left;   Patient Active Problem List   Diagnosis Date Noted   Tendinopathy of left gluteus medius 09/09/2023   Acute cystitis 04/16/2023   Change in bowel habits 01/15/2023   Anxiety 01/01/2023   Agatston coronary artery calcium score 22.4  (12/2021) 10/30/2022   Urge incontinence of urine 10/30/2022   Word  finding difficulty 10/30/2022   Chronic pain of left knee 08/21/2022   Lumbar radiculopathy 03/25/2022   Cough 12/27/2021   Sleep difficulties 09/27/2021   Cervicogenic headache 04/10/2021   Elevated blood pressure reading 04/10/2021   Skin abnormality 02/27/2021   Aortic atherosclerosis (HCC) 09/22/2020   Urinary urgency 03/23/2020   CKD (chronic kidney disease) stage 3, GFR 30-59 ml/min (HCC) 03/22/2020   Prediabetes 03/22/2020   Change in stool 11/17/2019   Right-sided chest wall pain 11/17/2019   Urinary retention 09/23/2019   SOB (shortness of breath) 01/15/2019   Upper airway cough syndrome 01/15/2019   Belching 12/14/2018   Reactive airway disease 12/05/2018   Tear of left rotator cuff 10/05/2018   Pre-syncope 05/19/2018   DDD (degenerative disc disease), cervical 01/15/2018   Neuralgia 09/12/2017   Presbycusis of both ears 12/11/2016   Memory difficulties 11/06/2016   IBS (irritable bowel syndrome) 04/16/2016   Hyperlipidemia 09/10/2015   Osteoporosis 09/08/2015   Palpitations    Pleural plaque without asbestos 12/03/2010   Chronic neck pain 12/18/2009   Diverticulosis of colon 03/02/2009   Lumbago 03/11/2008   Actinic keratosis 01/23/2008   Malignant neoplasm of female breast (HCC) 10/12/2007  Hypothyroidism 06/18/2007   Allergic rhinitis 06/11/2007      REFERRING PROVIDER:  Huel Cote, MD     REFERRING DIAG:  [id number] (ICD-10-CM) - Tendinopathy of left gluteus medius    s/p Lt GMR  Rationale for Evaluation and Treatment: Rehabilitation  THERAPY DIAG:  Pain in left hip  Difficulty in walking, not elsewhere classified  Muscle weakness (generalized)  ONSET DATE: DOS 09/09/23   SUBJECTIVE:                                                                                                                                                                                           SUBJECTIVE STATEMENT: Pt is 4 weeks and 6 days s/p L hip gluteus  maximus tendon transfers and trochanteric bursectomy.  Pt states she is not having hip pain though states her L knee is still bothering her.  Pt states her L knee pain is sporadic and is getting worse.  Pt reports having knee pain with home exercises, but no hip pain with home exercises.  Pt reports having increased L knee pain after prior Rx though no increased hip pain.      PERTINENT HISTORY:  Urge incontinence, chronic Lt hip & knee pain, OP, bilat occipital neuralgia  PAIN:  Are you having pain? Yes: NPRS scale: 0/10 L hip and 1/10 in lateral/ant L knee/10 Pain location: see above Pain description: ache, dull  Aggravating factors: constant Relieving factors: meds  PRECAUTIONS:  None  RED FLAGS: None   WEIGHT BEARING RESTRICTIONS:  Yes WBAT  FALLS:  Has patient fallen in last 6 months? No  LIVING ENVIRONMENT: 2 steps to enter home with handrail on Rt when ascending Does not need to go upstairs- bedroom is on the first floor  OCCUPATION:  retired  PLOF:  Independent  PATIENT GOALS:  Walk without pain and limping, stairs, water walking, I have a treadmill at home.    OBJECTIVE:  Note: Objective measures were completed at Evaluation unless otherwise noted.  TREATMENT DATE:   2/24 OBSERVATION:  Incision is intact without any signs of infection.  It appears that pt has a tiny subcue stitch at distal incision.  PT educated pt concerning incision.   Pt had questions concerning stairs.  PT educated pt in correct sequencing with step to gait pattern with stairs.  Upright bike x 1 min lvl 0 w/n protocol ranges--Pt didn't have pain with bike though wanted to hold off due to her continued knee pain.  Supine bridges 3x10 Hooklying Hip adductor squeeze with ball x10 and approx 6 reps with 5 sec hold Hip flexion submaximal isometrics with 5 sec hold  x6 and x4 reps  Pt received L hip flexion PROM in flexion and abd in supine per pt and tissue tolerance w/n protocol range   2/20 Therapeutic Exercise:  Upright bike x 3 mins lvl 0 w/n protocol ranges   Hooklying adductor sqz 5" 2x10 Bridge 2x10 Isometric hip flexion in hooklying 3" x10 Isometric hip abduction in hooklying (with gaitbelt) 5" x10 LAQ 1.3# 5" 2x10   Manual:  L hip PROM flexion, abduction, and IR STM to distal IT band in supine.     2/17 Therapeutic Exercise: Reviewed pt presentation, pain level, response to prior Rx, and HEP.   Upright bike x 3 mins lvl 0 w/n protocol ranges  Pt received L hip flexion PROM in flexion and abd in supine per pt and tissue tolerance w/n protocol range  Supine Quad sets 2x10 reps with 5 sec hold (5 reps of last set with towel roll under knee) SAQ 2x12 TrA contraction with 5 sec hold in hooklying PPT in hooklying 2x10  See below for pt education  Manual: STM/Trigger point release to distal IT band in supine.   2/10 Gait:  Pt ambulates with cane well with good sequencing without limping.  Ther Ex: SAQ 1x12, 2x10 Supine Tra contraction with 2-3 sec hold.  PT educated pt with correct performance and palpation of TrA. Supine PPT 2x10 Hip adductor squeeze with ball 2x10 PT reviewed form with quad sets and pt performed with 5 sec hold  Pt received L hip PROM in flexion and abduction per pt and tissue tolerance w/n protocol ranges.  Manual: Trigger point release to distal IT band in supine.  2/3  Manual:: Trigger point release to IT band; review of self release techniques   There-ex: PROM into flexion and IR    PPT x15  SAQ 3x12 first set no weight low RPE reported. 2nd set 1.5  Hip abduction with ball 2x10      Nuro-Re-ed  Standing weight shift 2x10 with education on moving stright across the toe     PATIENT EDUCATION:  Education details:  Relevant anatomy, POC, HEP, exercise form/rationale, protocol  restrictions and limitations.  PT answered pt's questions.  PT instructed pt to not perform exercises that increase knee pain.   Person educated: Patient Education method: Explanation, Demonstration, Tactile cues, Verbal cues, and Handouts Education comprehension: verbalized understanding, returned demonstration, verbal cues required, tactile cues required, and needs further education  HOME EXERCISE PROGRAM: [email or web address]   ASSESSMENT:  CLINICAL IMPRESSION: Pt presents to Rx c/o'ing of continued L knee pain which is bothering her.  Pt reports she is limited with performing home exercises due to L knee pain.  Pt states her hip is not bothering her, just her knee.  She states her pain is sporadic.  Pt sees MD next week though plans to contact MD to possibly get in this  week.  Ther-ex was limited and PT held off on certain exercises due to L knee pain and also to avoid any irritation to her L knee.  Pt reports having knee pain with hip adduction isometric.  PT used a smaller ball to squeeze and pt felt better though had knee pain later in the set.  PT had pt stop before completing the 2nd set due to pain.  Pt tolerated hip PROM well.  She reports no pain in knee, ITB, or hip after Rx.  Pt should benefit from cont skilled PT per protocol to address impairments and ongoing goals and to improve overall function.     REHAB POTENTIAL: Good  CLINICAL DECISION MAKING: Stable/uncomplicated  EVALUATION COMPLEXITY: Low   GOALS: Goals reviewed with patient? Yes  SHORT TERM GOALS: Target date: 10/07/23 (4 wks post op)  Able to demo gait with proper pattern for houshold distances without AD Baseline: Goal status: INITIAL  2.  Hip flexion to 90 deg without pain Baseline:  Goal status: INITIAL  3.  Good quad and glut activation in CKC Baseline:  Goal status: INITIAL   LONG TERM GOALS: Target date: POC Date  Perform stairs step over step without pain Baseline:  Goal status: INITIAL  2.  MMT 90%  of opposite lower extremity Baseline:  Goal status: INITIAL  3.  Ambulate community distances without pain Baseline:  Goal status: INITIAL  4.  Perform proper pelvic control in squat and SLS Baseline:  Goal status: INITIAL    PLAN:  PT FREQUENCY: 1-2x/week  PT DURATION: other: POC date  PLANNED INTERVENTIONS: 97164- PT Re-evaluation, 97110-Therapeutic exercises, 97530- Therapeutic activity, 97112- Neuromuscular re-education, 97535- Self Care, 13244- Manual therapy, [phone]- Gait training, [phone]- Aquatic Therapy, Patient/Family education, Balance training, Stair training, Taping, Dry Needling, Joint mobilization, Spinal mobilization, Scar mobilization, Cryotherapy, and Moist heat.  PLAN FOR NEXT SESSION: per protocol, scheduled to do aquatic PT after incision heals.  Monitor L knee and ITB pain.      Audie Clear III PT, DPT 10/14/23 8:19 AM

## 2023-10-13 ENCOUNTER — Telehealth (HOSPITAL_BASED_OUTPATIENT_CLINIC_OR_DEPARTMENT_OTHER): Payer: Self-pay | Admitting: Orthopaedic Surgery

## 2023-10-13 ENCOUNTER — Ambulatory Visit (HOSPITAL_BASED_OUTPATIENT_CLINIC_OR_DEPARTMENT_OTHER): Payer: PPO | Admitting: Physical Therapy

## 2023-10-13 DIAGNOSIS — M25552 Pain in left hip: Secondary | ICD-10-CM

## 2023-10-13 DIAGNOSIS — M6281 Muscle weakness (generalized): Secondary | ICD-10-CM

## 2023-10-13 DIAGNOSIS — R262 Difficulty in walking, not elsewhere classified: Secondary | ICD-10-CM

## 2023-10-13 NOTE — Telephone Encounter (Signed)
 Patient wants to know if she can come in before her appointment in march.

## 2023-10-14 ENCOUNTER — Encounter (HOSPITAL_BASED_OUTPATIENT_CLINIC_OR_DEPARTMENT_OTHER): Payer: Self-pay | Admitting: Physical Therapy

## 2023-10-15 ENCOUNTER — Ambulatory Visit (HOSPITAL_BASED_OUTPATIENT_CLINIC_OR_DEPARTMENT_OTHER): Payer: PPO

## 2023-10-15 ENCOUNTER — Ambulatory Visit (HOSPITAL_BASED_OUTPATIENT_CLINIC_OR_DEPARTMENT_OTHER): Payer: PPO | Admitting: Orthopaedic Surgery

## 2023-10-15 DIAGNOSIS — M67952 Unspecified disorder of synovium and tendon, left thigh: Secondary | ICD-10-CM

## 2023-10-15 DIAGNOSIS — M25562 Pain in left knee: Secondary | ICD-10-CM | POA: Diagnosis not present

## 2023-10-15 DIAGNOSIS — M1712 Unilateral primary osteoarthritis, left knee: Secondary | ICD-10-CM | POA: Diagnosis not present

## 2023-10-15 DIAGNOSIS — G4486 Cervicogenic headache: Secondary | ICD-10-CM | POA: Diagnosis not present

## 2023-10-15 DIAGNOSIS — M47812 Spondylosis without myelopathy or radiculopathy, cervical region: Secondary | ICD-10-CM | POA: Diagnosis not present

## 2023-10-15 MED ORDER — TRIAMCINOLONE ACETONIDE 40 MG/ML IJ SUSP
80.0000 mg | INTRAMUSCULAR | Status: AC | PRN
  Administered 2023-10-15: 80 mg via INTRA_ARTICULAR

## 2023-10-15 MED ORDER — LIDOCAINE HCL 1 % IJ SOLN
4.0000 mL | INTRAMUSCULAR | Status: AC | PRN
  Administered 2023-10-15: 4 mL

## 2023-10-15 NOTE — Progress Notes (Signed)
 Post Operative Evaluation    Procedure/Date of Surgery: Left hip gluteus maximus tendon transfer 1/21  Interval History:   Presents today 6 weeks status post left hip gluteus maximus tendon transfer overall doing well.  Overall Emily [name] is continuing to do well.  Emily [name] is just experiencing some pain about the lateral aspect of the IT band.  This is down by the knee   PMH/PSH/Family History/Social History/Meds/Allergies:    Past Medical History:  Diagnosis Date  . Actinic keratosis   . Allergic rhinitis   . BACK PAIN   . 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 Cervicalgia   . Cholelithiasis   . DIVERTICULOSIS, COLON   . ENDOMETRIOSIS   . Heart palpitations   . HYPOTHYROIDISM   . Left wrist fracture 11/06/2016   10/2016 - fell of high stool  . Osteoporosis   . Palpitations   . Personal history of chemotherapy   . Personal history of radiation therapy   . Pleural plaque without asbestos    CT chest 10/2010: R>L lobes, upper> lower  . Renal disorder   . Squamous cell carcinoma    History of BC   Past Surgical History:  Procedure Laterality Date  . BREAST LUMPECTOMY  2001   left  . CATARACT EXTRACTION Bilateral 09/2014   both eyes  . CHOLECYSTECTOMY  2010  . ENDOMETRIAL ABLATION    . EXCISION / BIOPSY BREAST / NIPPLE / DUCT Left 2001  . GLUTEUS MINIMUS REPAIR Left 09/09/2023   Procedure: LEFT GLUTEUS MAXIMUS TENDON TRANSFER REPAIR;  Surgeon: Huel Cote, MD;  Location: [facility];  Service: Orthopedics;  Laterality: Left;   Social History   Socioeconomic History  . Marital status: Married    Spouse name: Not on file  . Number of children: 2  . Years of education: Not on file  . Highest education level: Not on file  Occupational History  . Occupation: Retired    Comment: Advertising account executive  Tobacco Use  . Smoking status: Never  . Smokeless tobacco: Never  Vaping Use  .  Vaping status: Never Used  Substance and Sexual Activity  . Alcohol use: Not Currently  . Drug use: No  . Sexual activity: Not Currently    Birth control/protection: Post-menopausal  Other Topics Concern  . Not on file  Social History Narrative   Married, lives in [place] area since 2008 from [place] to be close to dtr   Social Drivers of [employer]: Low Risk  (02/06/2023)   Overall Financial Resource Strain (CARDIA)   . Difficulty of Paying Living Expenses: Not hard at all  Food Insecurity: No Food Insecurity (02/06/2023)   Hunger Vital Sign   . Worried About Programme researcher, broadcasting/film/video in the Last Year: Never true   . Ran Out of Food in the Last Year: Never true  Transportation Needs: No Transportation Needs (02/06/2023)   PRAPARE - Transportation   . Lack of Transportation (Medical): No   . Lack of Transportation (Non-Medical): No  Physical Activity: Sufficiently Active (02/06/2023)   Exercise Vital Sign   . Days of Exercise per Week: 5 days   . Minutes of Exercise per Session: 30 min  Stress: No Stress Concern Present (02/06/2023)   [place]  Institute of Occupational Health - Occupational Stress Questionnaire   . Feeling of Stress : Not at all  Social Connections: Socially Integrated (02/06/2023)   Social Connection and Isolation Panel [NHANES]   . Frequency of Communication with Friends and Family: More than three times a week   . Frequency of Social Gatherings with Friends and Family: More than three times a week   . Attends Religious Services: More than 4 times per year   . Active Member of Clubs or Organizations: Yes   . Attends Banker Meetings: More than 4 times per year   . Marital Status: Married   Family History  Problem Relation Age of Onset  . Dementia Mother   . Heart disease Father   . Emphysema Father        smoked and worked with asbestos  . Colon cancer Neg Hx   . Esophageal cancer Neg Hx   . Rectal cancer Neg Hx   . Migraines Neg  Hx   . Headache Neg Hx    Allergies  Allergen Reactions  . Covid-19 (Mrna) Vaccine Anaphylaxis  . Molnupiravir Other (See Comments)    Tongue swelling  . Allegra [Fexofenadine]     Didn't agree with Pt   . Levaquin [Levofloxacin] Swelling and Other (See Comments)    Hallucinations, swelling of throat  . Nexium [Esomeprazole Magnesium]     diarrhea  . Prednisone     Facial flushing  . Valerian Root Other (See Comments)    Loose stools   Current Outpatient Medications  Medication Sig Dispense Refill  . atenolol (TENORMIN) 25 MG tablet TAKE 1 TABLET BY MOUTH EVERY DAY 90 tablet 2  . Calcium Carb-Cholecalciferol (CALCIUM 600 + D PO) Take by mouth.    . cetirizine-pseudoephedrine (ZYRTEC-D) 5-120 MG tablet Take 1 tablet by mouth 2 (two) times daily as needed for allergies.    . chlorpheniramine (CHLOR-TRIMETON) 4 MG tablet Take 4 mg by mouth 2 (two) times daily as needed for allergies. (Emily [name] not taking: Reported on 09/15/2023)    . cholecalciferol (VITAMIN D) 25 MCG (1000 UNIT) tablet Take 1,000 Units by mouth daily.    Marland Kitchen EPINEPHrine 0.3 mg/0.3 mL IJ SOAJ injection Inject 0.3 mg into the muscle as needed for anaphylaxis. 1 each 0  . gabapentin (NEURONTIN) 100 MG capsule TAKE 1 CAPSULE (100 MG TOTAL) BY MOUTH AT BEDTIME. TAKE 1 TO 3 CAPSULES AT BEDTIME 30 capsule 3  . levothyroxine (SYNTHROID) 75 MCG tablet TAKE 1 TABLET BY MOUTH EVERY DAY 90 tablet 3  . ondansetron (ZOFRAN-ODT) 8 MG disintegrating tablet Take 1 tablet (8 mg total) by mouth every 8 (eight) hours as needed for nausea or vomiting. 10 tablet 0  . oxyCODONE (ROXICODONE) 5 MG immediate release tablet Take 1 tablet (5 mg total) by mouth every 4 (four) hours as needed for severe pain (pain score 7-10) or breakthrough pain. (Emily [name] not taking: Reported on 09/15/2023) 30 tablet 0   Current Facility-Administered Medications  Medication Dose Route Frequency Provider Last Rate Last Admin  . [START ON 02/02/2024] denosumab (PROLIA)  injection 60 mg  60 mg Subcutaneous Once Pincus Sanes, MD       No results found.  Review of Systems:   A ROS was performed including pertinent positives and negatives as documented in the HPI.   Musculoskeletal Exam:    There were no vitals taken for this visit.  Left hip incision is well-appearing without erythema or drainage.  Tenderness over the IT  band distally as it runs to the knee.  30 degrees internal/external rotation of the left hip without pain.  Distal neurosensory exam is intact  Imaging:      I personally reviewed and interpreted the radiographs.   Assessment:   6-week status post left hip gluteus maximus tendon transfer overall doing well.  Emily [name] is experiencing some IT band symptoms at today's visit.  Given this I have recommended ultrasound-guided injection of the IT band.  Emily [name] would like to proceed with this  Plan :    -Left knee IT band injection provided after verbal consent obtained from    Procedure Note  Emily [name]: Emily [name]             Date of Birth: Apr 04, 1941           MRN: [id number]             Visit Date: 10/15/2023  Procedures: Visit Diagnoses:  1. Tendinopathy of left gluteus medius     Large Joint Inj: L knee on 10/15/2023 5:31 PM Indications: pain Details: 22 G 1.5 in needle, ultrasound-guided anterior approach  Arthrogram: No  Medications: 4 mL lidocaine 1 %; 80 mg triamcinolone acetonide 40 MG/ML Outcome: tolerated well, no immediate complications Procedure, treatment alternatives, risks and benefits explained, specific risks discussed. Consent was given by the Emily [name]. Immediately prior to procedure a time out was called to verify the correct Emily [name], procedure, equipment, support staff and site/side marked as required. Emily [name] was prepped and draped in the usual sterile fashion.           I personally saw and evaluated the Emily [name], and participated in the management and treatment plan.  Huel Cote,  MD Attending Physician, Orthopedic Surgery  This document was dictated using Dragon voice recognition software. A reasonable attempt at proof reading has been made to minimize errors.

## 2023-10-16 ENCOUNTER — Encounter (HOSPITAL_BASED_OUTPATIENT_CLINIC_OR_DEPARTMENT_OTHER): Payer: Self-pay | Admitting: Physical Therapy

## 2023-10-16 ENCOUNTER — Ambulatory Visit (HOSPITAL_BASED_OUTPATIENT_CLINIC_OR_DEPARTMENT_OTHER): Payer: PPO | Admitting: Physical Therapy

## 2023-10-16 ENCOUNTER — Encounter (HOSPITAL_BASED_OUTPATIENT_CLINIC_OR_DEPARTMENT_OTHER): Payer: PPO

## 2023-10-16 DIAGNOSIS — M25552 Pain in left hip: Secondary | ICD-10-CM

## 2023-10-16 DIAGNOSIS — M6281 Muscle weakness (generalized): Secondary | ICD-10-CM

## 2023-10-16 DIAGNOSIS — R262 Difficulty in walking, not elsewhere classified: Secondary | ICD-10-CM

## 2023-10-16 NOTE — Therapy (Signed)
 OUTPATIENT PHYSICAL THERAPY TREATMENT   Patient Name: Emily [name] MRN: [id number] DOB:12/19/1940, 83 y.o., female Today's Date: 10/16/2023  END OF SESSION:  PT End of Session - 10/16/23 1112     Visit Number 7    Number of Visits 28    Date for PT Re-Evaluation 12/20/23    Authorization Type HTA, no VL    PT Start Time 1108                    Past Medical History:  Diagnosis Date   Actinic keratosis    Allergic rhinitis    BACK PAIN    CARCINOMA, BREAST, 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   L s/p lumpectomy, chemo and XRT   Cervicalgia    Cholelithiasis    DIVERTICULOSIS, COLON    ENDOMETRIOSIS    Heart palpitations    HYPOTHYROIDISM    Left wrist fracture 11/06/2016   10/2016 - fell of high stool   Osteoporosis    Palpitations    Personal history of chemotherapy    Personal history of radiation therapy    Pleural plaque without asbestos    CT chest 10/2010: R>L lobes, upper> lower   Renal disorder    Squamous cell carcinoma    History of BC   Past Surgical History:  Procedure Laterality Date   BREAST LUMPECTOMY  2001   left   CATARACT EXTRACTION Bilateral 09/2014   both eyes   CHOLECYSTECTOMY  2010   ENDOMETRIAL ABLATION     EXCISION / BIOPSY BREAST / NIPPLE / DUCT Left 2001   GLUTEUS MINIMUS REPAIR Left 09/09/2023   Procedure: LEFT GLUTEUS MAXIMUS TENDON TRANSFER REPAIR;  Surgeon: Huel Cote, MD;  Location: [facility];  Service: Orthopedics;  Laterality: Left;   Patient Active Problem List   Diagnosis Date Noted   Tendinopathy of left gluteus medius 09/09/2023   Acute cystitis 04/16/2023   Change in bowel habits 01/15/2023   Anxiety 01/01/2023   Agatston coronary artery calcium score 22.4  (12/2021) 10/30/2022   Urge incontinence of urine 10/30/2022   Word finding difficulty 10/30/2022   Chronic pain of left knee 08/21/2022   Lumbar radiculopathy 03/25/2022   Cough 12/27/2021   Sleep difficulties 09/27/2021    Cervicogenic headache 04/10/2021   Elevated blood pressure reading 04/10/2021   Skin abnormality 02/27/2021   Aortic atherosclerosis (HCC) 09/22/2020   Urinary urgency 03/23/2020   CKD (chronic kidney disease) stage 3, GFR 30-59 ml/min (HCC) 03/22/2020   Prediabetes 03/22/2020   Change in stool 11/17/2019   Right-sided chest wall pain 11/17/2019   Urinary retention 09/23/2019   SOB (shortness of breath) 01/15/2019   Upper airway cough syndrome 01/15/2019   Belching 12/14/2018   Reactive airway disease 12/05/2018   Tear of left rotator cuff 10/05/2018   Pre-syncope 05/19/2018   DDD (degenerative disc disease), cervical 01/15/2018   Neuralgia 09/12/2017   Presbycusis of both ears 12/11/2016   Memory difficulties 11/06/2016   IBS (irritable bowel syndrome) 04/16/2016   Hyperlipidemia 09/10/2015   Osteoporosis 09/08/2015   Palpitations    Pleural plaque without asbestos 12/03/2010   Chronic neck pain 12/18/2009   Diverticulosis of colon 03/02/2009   Lumbago 03/11/2008   Actinic keratosis 01/23/2008   Malignant neoplasm of female breast (HCC) 10/12/2007   Hypothyroidism 06/18/2007   Allergic rhinitis 06/11/2007      REFERRING PROVIDER:  Huel Cote, MD     REFERRING DIAG:  [phone] (ICD-10-CM) - Tendinopathy of left gluteus medius  s/p Lt GMR  Rationale for Evaluation and Treatment: Rehabilitation  THERAPY DIAG:  Pain in left hip  Difficulty in walking, not elsewhere classified  Muscle weakness (generalized)  ONSET DATE: DOS 09/09/23   SUBJECTIVE:                                                                                                                                                                                           SUBJECTIVE STATEMENT: Pt is 5 weeks and 2 days s/p L hip gluteus maximus tendon transfers and trochanteric bursectomy.   Pt saw MD yesterday and he took x rays of L knee.  Pt states she has a bone spur which is irritating the IT  band.  MD gave pt a shot in her knee yesterday.  Pt states her knee is still hurting a lot and the shot has not helped.  MD looked at the incision and states she is walking good.  Pt starts aquatic therapy next visit and MD is ready for her to start aquatics.     PERTINENT HISTORY:  Urge incontinence, chronic Lt hip & knee pain, OP, bilat occipital neuralgia  PAIN:  Are you having pain? Yes: NPRS scale: 0/10 L hip and 5/10 in lateral L knee/10 Pain location: see above Pain description: ache, dull  Aggravating factors: constant Relieving factors: meds  PRECAUTIONS:  None  RED FLAGS: None   WEIGHT BEARING RESTRICTIONS:  Yes WBAT  FALLS:  Has patient fallen in last 6 months? No  LIVING ENVIRONMENT: 2 steps to enter home with handrail on Rt when ascending Does not need to go upstairs- bedroom is on the first floor  OCCUPATION:  retired  PLOF:  Independent  PATIENT GOALS:  Walk without pain and limping, stairs, water walking, I have a treadmill at home.    OBJECTIVE:  Note: Objective measures were completed at Evaluation unless otherwise noted.                                                                                                                              TREATMENT DATE:  Treatment deferred today due to pt receiving an US guided injection yesterday.     PATIENT EDUCATION:  Education details:  POC and ice usage.   Person educated: Patient Education method: Explanation Education comprehension: verbalized understanding  HOME EXERCISE PROGRAM: [id number]   ASSESSMENT:  CLINICAL IMPRESSION: Pt saw MD yesterday and received an US guided injection to her knee.  Pt continues to have pain in knee and states it hurts a lot.  PT instructed pt she could use ice to reduce pain in knee after the injection.  PT decided to hold on treatment today due to pt having an US guided injection less than 24 hours prior, her pain level, and also her prior pain response to  prior exercises.  Pt also states she would like to hold on therapy today due to her knee.  Pt is in agreement.  Pt should benefit from cont skilled PT per protocol to address impairments and ongoing goals and to improve overall function.     REHAB POTENTIAL: Good  CLINICAL DECISION MAKING: Stable/uncomplicated  EVALUATION COMPLEXITY: Low   GOALS: Goals reviewed with patient? Yes  SHORT TERM GOALS: Target date: 10/07/23 (4 wks post op)  Able to demo gait with proper pattern for houshold distances without AD Baseline: Goal status: INITIAL  2.  Hip flexion to 90 deg without pain Baseline:  Goal status: INITIAL  3.  Good quad and glut activation in CKC Baseline:  Goal status: INITIAL   LONG TERM GOALS: Target date: POC Date  Perform stairs step over step without pain Baseline:  Goal status: INITIAL  2.  MMT 90% of opposite lower extremity Baseline:  Goal status: INITIAL  3.  Ambulate community distances without pain Baseline:  Goal status: INITIAL  4.  Perform proper pelvic control in squat and SLS Baseline:  Goal status: INITIAL    PLAN:  PT FREQUENCY: 1-2x/week  PT DURATION: other: POC date  PLANNED INTERVENTIONS: 97164- PT Re-evaluation, 97110-Therapeutic exercises, 97530- Therapeutic activity, 97112- Neuromuscular re-education, 97535- Self Care, 96045- Manual therapy, [phone]- Gait training, [phone]- Aquatic Therapy, Patient/Family education, Balance training, Stair training, Taping, Dry Needling, Joint mobilization, Spinal mobilization, Scar mobilization, Cryotherapy, and Moist heat.  PLAN FOR NEXT SESSION:  Continue per Dr. Serena Croissant glute med repair protocol.  Monitor L knee and ITB pain.  Pt to start aquatic therapy next visit.     Audie Clear III PT, DPT 10/16/23 11:36 AM

## 2023-10-20 ENCOUNTER — Encounter (HOSPITAL_BASED_OUTPATIENT_CLINIC_OR_DEPARTMENT_OTHER): Payer: PPO | Admitting: Orthopaedic Surgery

## 2023-10-21 ENCOUNTER — Encounter (HOSPITAL_BASED_OUTPATIENT_CLINIC_OR_DEPARTMENT_OTHER): Payer: Self-pay | Admitting: Physical Therapy

## 2023-10-21 ENCOUNTER — Ambulatory Visit (HOSPITAL_BASED_OUTPATIENT_CLINIC_OR_DEPARTMENT_OTHER): Payer: PPO | Attending: Orthopaedic Surgery | Admitting: Physical Therapy

## 2023-10-21 DIAGNOSIS — M6281 Muscle weakness (generalized): Secondary | ICD-10-CM | POA: Diagnosis not present

## 2023-10-21 DIAGNOSIS — M5459 Other low back pain: Secondary | ICD-10-CM | POA: Diagnosis not present

## 2023-10-21 DIAGNOSIS — R262 Difficulty in walking, not elsewhere classified: Secondary | ICD-10-CM | POA: Diagnosis not present

## 2023-10-21 DIAGNOSIS — M25552 Pain in left hip: Secondary | ICD-10-CM | POA: Diagnosis not present

## 2023-10-21 NOTE — Therapy (Signed)
 OUTPATIENT PHYSICAL THERAPY TREATMENT   Patient Name: Emily [name] MRN: [id number] DOB:Oct 15, 1940, 83 y.o., female Today's Date: 10/21/2023  END OF SESSION:  PT End of Session - 10/21/23 1017     Visit Number 8    Number of Visits 28    Date for PT Re-Evaluation 12/20/23    Authorization Type HTA, no VL    PT Start Time 1016    PT Stop Time 1055    PT Time Calculation (min) 39 min    Activity Tolerance Patient tolerated treatment well    Behavior During Therapy WFL for tasks assessed/performed                    Past Medical History:  Diagnosis Date   Actinic keratosis    Allergic rhinitis    BACK PAIN    CARCINOMA, BREAST, 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   L s/p lumpectomy, chemo and XRT   Cervicalgia    Cholelithiasis    DIVERTICULOSIS, COLON    ENDOMETRIOSIS    Heart palpitations    HYPOTHYROIDISM    Left wrist fracture 11/06/2016   10/2016 - fell of high stool   Osteoporosis    Palpitations    Personal history of chemotherapy    Personal history of radiation therapy    Pleural plaque without asbestos    CT chest 10/2010: R>L lobes, upper> lower   Renal disorder    Squamous cell carcinoma    History of BC   Past Surgical History:  Procedure Laterality Date   BREAST LUMPECTOMY  2001   left   CATARACT EXTRACTION Bilateral 09/2014   both eyes   CHOLECYSTECTOMY  2010   ENDOMETRIAL ABLATION     EXCISION / BIOPSY BREAST / NIPPLE / DUCT Left 2001   GLUTEUS MINIMUS REPAIR Left 09/09/2023   Procedure: LEFT GLUTEUS MAXIMUS TENDON TRANSFER REPAIR;  Surgeon: Huel Cote, MD;  Location: [facility];  Service: Orthopedics;  Laterality: Left;   Patient Active Problem List   Diagnosis Date Noted   Tendinopathy of left gluteus medius 09/09/2023   Acute cystitis 04/16/2023   Change in bowel habits 01/15/2023   Anxiety 01/01/2023   Agatston coronary artery calcium score 22.4  (12/2021) 10/30/2022   Urge incontinence of urine 10/30/2022    Word finding difficulty 10/30/2022   Chronic pain of left knee 08/21/2022   Lumbar radiculopathy 03/25/2022   Cough 12/27/2021   Sleep difficulties 09/27/2021   Cervicogenic headache 04/10/2021   Elevated blood pressure reading 04/10/2021   Skin abnormality 02/27/2021   Aortic atherosclerosis (HCC) 09/22/2020   Urinary urgency 03/23/2020   CKD (chronic kidney disease) stage 3, GFR 30-59 ml/min (HCC) 03/22/2020   Prediabetes 03/22/2020   Change in stool 11/17/2019   Right-sided chest wall pain 11/17/2019   Urinary retention 09/23/2019   SOB (shortness of breath) 01/15/2019   Upper airway cough syndrome 01/15/2019   Belching 12/14/2018   Reactive airway disease 12/05/2018   Tear of left rotator cuff 10/05/2018   Pre-syncope 05/19/2018   DDD (degenerative disc disease), cervical 01/15/2018   Neuralgia 09/12/2017   Presbycusis of both ears 12/11/2016   Memory difficulties 11/06/2016   IBS (irritable bowel syndrome) 04/16/2016   Hyperlipidemia 09/10/2015   Osteoporosis 09/08/2015   Palpitations    Pleural plaque without asbestos 12/03/2010   Chronic neck pain 12/18/2009   Diverticulosis of colon 03/02/2009   Lumbago 03/11/2008   Actinic keratosis 01/23/2008   Malignant neoplasm of female breast (HCC) 10/12/2007  Hypothyroidism 06/18/2007   Allergic rhinitis 06/11/2007      REFERRING PROVIDER:  Huel Cote, MD     REFERRING DIAG:  [phone] (ICD-10-CM) - Tendinopathy of left gluteus medius    s/p Lt GMR  Rationale for Evaluation and Treatment: Rehabilitation  THERAPY DIAG:  Pain in left hip  Difficulty in walking, not elsewhere classified  Muscle weakness (generalized)  ONSET DATE: DOS 09/09/23   SUBJECTIVE:                                                                                                                                                                                           SUBJECTIVE STATEMENT: Pt is 6 weeks s/p L hip gluteus maximus  tendon transfers and trochanteric bursectomy.   "Left knee still hurts, shot doesn't seem to have done anything.  My hip feels good"     PERTINENT HISTORY:  Urge incontinence, chronic Lt hip & knee pain, OP, bilat occipital neuralgia  PAIN:  Are you having pain? Yes: NPRS scale: 0/10 L hip and 5/10 in lateral L knee Pain location: see above Pain description: ache, dull  Aggravating factors: constant Relieving factors: meds  PRECAUTIONS:  None  RED FLAGS: None   WEIGHT BEARING RESTRICTIONS:  Yes WBAT  FALLS:  Has patient fallen in last 6 months? No  LIVING ENVIRONMENT: 2 steps to enter home with handrail on Rt when ascending Does not need to go upstairs- bedroom is on the first floor  OCCUPATION:  retired  PLOF:  Independent  PATIENT GOALS:  Walk without pain and limping, stairs, water walking, I have a treadmill at home.    OBJECTIVE:  Note: Objective measures were completed at Evaluation unless otherwise noted.                                                                                                                              TREATMENT DATE:  [facility] Adult PT Treatment:  DATE: 10/21/23 Pt seen for aquatic therapy today.  Treatment took place in water 3.5-4.75 ft in depth at the [facility] pool. Temp of water was 91.  Pt entered/exited the pool via stairs using step to pattern with hand rail.  *intro to setting *Walking forward, back ue support barbell  -marching *standing ue support wall 3.6 ft: high knee marching; hip add/abd; hip ext; hip flex; relaxed squats x 8-10. *side stepping ue support  *seated on lift: cycling; hip add/abd *Step ups on bottom step leading r/l x 8 ue support hand rails *standing ue support barbell toe raises, heel raises.  Pt requires the buoyancy and hydrostatic pressure of water for support, and to offload joints by unweighting joint load by at least 50 % in navel deep  water and by at least 75-80% in chest to neck deep water.  Viscosity of the water is needed for resistance of strengthening. Water current perturbations provides challenge to standing balance requiring increased core activation.     PATIENT EDUCATION:  Education details:  POC and ice usage.   Person educated: Patient Education method: Explanation Education comprehension: verbalized understanding  HOME EXERCISE PROGRAM: [id number]   ASSESSMENT:  CLINICAL IMPRESSION: Pt demonstrates safety and independence in aquatic setting with therapist instructing from deck. She is most comfortable in 4.0 ft and <moving easily with and without ue support. Protocol followed.  She completes without difficulty reducing pain in knee (none in hip) as session progresses. She is a good candidate for aquatic therapy and will benefit from the properties of water to progress towards land based goals.  Goals ongoing    REHAB POTENTIAL: Good  CLINICAL DECISION MAKING: Stable/uncomplicated  EVALUATION COMPLEXITY: Low   GOALS: Goals reviewed with patient? Yes  SHORT TERM GOALS: Target date: 10/07/23 (4 wks post op)  Able to demo gait with proper pattern for houshold distances without AD Baseline: Goal status: INITIAL  2.  Hip flexion to 90 deg without pain Baseline:  Goal status: INITIAL  3.  Good quad and glut activation in CKC Baseline:  Goal status: INITIAL   LONG TERM GOALS: Target date: POC Date  Perform stairs step over step without pain Baseline:  Goal status: INITIAL  2.  MMT 90% of opposite lower extremity Baseline:  Goal status: INITIAL  3.  Ambulate community distances without pain Baseline:  Goal status: INITIAL  4.  Perform proper pelvic control in squat and SLS Baseline:  Goal status: INITIAL    PLAN:  PT FREQUENCY: 1-2x/week  PT DURATION: other: POC date  PLANNED INTERVENTIONS: 97164- PT Re-evaluation, 97110-Therapeutic exercises, 97530- Therapeutic activity,  97112- Neuromuscular re-education, 97535- Self Care, 16109- Manual therapy, [phone]- Gait training, [phone]- Aquatic Therapy, Patient/Family education, Balance training, Stair training, Taping, Dry Needling, Joint mobilization, Spinal mobilization, Scar mobilization, Cryotherapy, and Moist heat.  PLAN FOR NEXT SESSION:  Continue per Dr. Serena Croissant glute med repair protocol.  Monitor L knee and ITB pain.  Pt to start aquatic therapy next visit.     [address] [facility]) Endrit Gittins MPT 10/21/23 1:08 PM [facility] [address] [place], [place], [address] Phone: [phone]   Fax:  [phone]

## 2023-10-23 ENCOUNTER — Ambulatory Visit (HOSPITAL_BASED_OUTPATIENT_CLINIC_OR_DEPARTMENT_OTHER): Payer: PPO | Admitting: Physical Therapy

## 2023-10-23 ENCOUNTER — Encounter (HOSPITAL_BASED_OUTPATIENT_CLINIC_OR_DEPARTMENT_OTHER): Payer: Self-pay | Admitting: Physical Therapy

## 2023-10-23 DIAGNOSIS — M25552 Pain in left hip: Secondary | ICD-10-CM

## 2023-10-23 DIAGNOSIS — M6281 Muscle weakness (generalized): Secondary | ICD-10-CM

## 2023-10-23 DIAGNOSIS — R262 Difficulty in walking, not elsewhere classified: Secondary | ICD-10-CM

## 2023-10-23 NOTE — Therapy (Signed)
 OUTPATIENT PHYSICAL THERAPY TREATMENT   Patient Name: Emily [name] MRN: [id number] DOB:[birth year], 83 y.o., female Today's Date: 10/23/2023  END OF SESSION:  PT End of Session - 10/23/23 1004     Visit Number 9    Number of Visits 28    Date for PT Re-Evaluation 12/20/23    Authorization Type HTA, no VL    PT Start Time 1008    PT Stop Time 1047    PT Time Calculation (min) 39 min    Activity Tolerance Patient tolerated treatment well    Behavior During Therapy WFL for tasks assessed/performed                    Past Medical History:  Diagnosis Date   Actinic keratosis    Allergic rhinitis    BACK PAIN    CARCINOMA, BREAST, 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   L s/p lumpectomy, chemo and XRT   Cervicalgia    Cholelithiasis    DIVERTICULOSIS, COLON    ENDOMETRIOSIS    Heart palpitations    HYPOTHYROIDISM    Left wrist fracture 11/06/2016   10/2016 - fell of high stool   Osteoporosis    Palpitations    Personal history of chemotherapy    Personal history of radiation therapy    Pleural plaque without asbestos    CT chest 10/2010: R>L lobes, upper> lower   Renal disorder    Squamous cell carcinoma    History of BC   Past Surgical History:  Procedure Laterality Date   BREAST LUMPECTOMY  2001   left   CATARACT EXTRACTION Bilateral 09/2014   both eyes   CHOLECYSTECTOMY  2010   ENDOMETRIAL ABLATION     EXCISION / BIOPSY BREAST / NIPPLE / DUCT Left 2001   GLUTEUS MINIMUS REPAIR Left 09/09/2023   Procedure: LEFT GLUTEUS MAXIMUS TENDON TRANSFER REPAIR;  Surgeon: Huel Cote, MD;  Location: [facility];  Service: Orthopedics;  Laterality: Left;   Patient Active Problem List   Diagnosis Date Noted   Tendinopathy of left gluteus medius 09/09/2023   Acute cystitis 04/16/2023   Change in bowel habits 01/15/2023   Anxiety 01/01/2023   Agatston coronary artery calcium score 22.4  (12/2021) 10/30/2022   Urge incontinence of urine 10/30/2022    Word finding difficulty 10/30/2022   Chronic pain of left knee 08/21/2022   Lumbar radiculopathy 03/25/2022   Cough 12/27/2021   Sleep difficulties 09/27/2021   Cervicogenic headache 04/10/2021   Elevated blood pressure reading 04/10/2021   Skin abnormality 02/27/2021   Aortic atherosclerosis (HCC) 09/22/2020   Urinary urgency 03/23/2020   CKD (chronic kidney disease) stage 3, GFR 30-59 ml/min (HCC) 03/22/2020   Prediabetes 03/22/2020   Change in stool 11/17/2019   Right-sided chest wall pain 11/17/2019   Urinary retention 09/23/2019   SOB (shortness of breath) 01/15/2019   Upper airway cough syndrome 01/15/2019   Belching 12/14/2018   Reactive airway disease 12/05/2018   Tear of left rotator cuff 10/05/2018   Pre-syncope 05/19/2018   DDD (degenerative disc disease), cervical 01/15/2018   Neuralgia 09/12/2017   Presbycusis of both ears 12/11/2016   Memory difficulties 11/06/2016   IBS (irritable bowel syndrome) 04/16/2016   Hyperlipidemia 09/10/2015   Osteoporosis 09/08/2015   Palpitations    Pleural plaque without asbestos 12/03/2010   Chronic neck pain 12/18/2009   Diverticulosis of colon 03/02/2009   Lumbago 03/11/2008   Actinic keratosis 01/23/2008   Malignant neoplasm of female breast (HCC) 10/12/2007  Hypothyroidism 06/18/2007   Allergic rhinitis 06/11/2007      REFERRING PROVIDER:  Huel Cote, MD   REFERRING DIAG:  [phone] (ICD-10-CM) - Tendinopathy of left gluteus medius    s/p Lt GMR  Rationale for Evaluation and Treatment: Rehabilitation  THERAPY DIAG:  Pain in left hip  Difficulty in walking, not elsewhere classified  Muscle weakness (generalized)  ONSET DATE: DOS 09/09/23   SUBJECTIVE:                                                                                                                                                                                           SUBJECTIVE STATEMENT: Pt reports she had some soreness in front of  L hip the next day, but iced it afterwards with good relief.      PERTINENT HISTORY:  Urge incontinence, chronic Lt hip & knee pain, OP, bilat occipital neuralgia  PAIN:  Are you having pain? Yes: NPRS scale: 1/10 L hip (when walking) and 0/10 in lateral L knee Pain location: see above Pain description: ache, dull  Aggravating factors: constant Relieving factors: meds  PRECAUTIONS:  None  RED FLAGS: None   WEIGHT BEARING RESTRICTIONS:  Yes WBAT  FALLS:  Has patient fallen in last 6 months? No  LIVING ENVIRONMENT: 2 steps to enter home with handrail on Rt when ascending Does not need to go upstairs- bedroom is on the first floor  OCCUPATION:  retired  PLOF:  Independent  PATIENT GOALS:  Walk without pain and limping, stairs, water walking, I have a treadmill at home.    OBJECTIVE:  Note: Objective measures were completed at Evaluation unless otherwise noted.                                                                                                                              TREATMENT DATE:  [facility] Adult PT Treatment:  DATE: 10/23/23 Pt seen for aquatic therapy today.  Treatment took place in water 3.5-4.75 ft in depth at the [facility] pool. Temp of water was 91.  Pt entered/exited the pool via stairs using step-to pattern with hand rail.  *UE support on barbell: Walking forward 1 lap;  backward 3 laps;  side stepping 1 lap * arm addct/ abdct with rainbow hand floats x 2 laps * UE on rainbow hand floats with row motion: marching backward/ forward (height of knee to tolerance) * UE on wall: hip abdct/ addct 2 x 5 and hip flex/ ext x 10 each (cues to control height of leg) * walking unsupported forward/ backward - cues for even step length * UE on wall:  heel raises x 10; hip abdct/ addct x 10 * L forward step ups x 10   Pt requires the buoyancy and hydrostatic pressure of water for support, and to  offload joints by unweighting joint load by at least 50 % in navel deep water and by at least 75-80% in chest to neck deep water.  Viscosity of the water is needed for resistance of strengthening. Water current perturbations provides challenge to standing balance requiring increased core activation.     PATIENT EDUCATION:  Education details:  POC and ice usage.   Person educated: Patient Education method: Explanation Education comprehension: verbalized understanding  HOME EXERCISE PROGRAM: [id number]   ASSESSMENT:  CLINICAL IMPRESSION: Pt is 6 wks 2 days  s/p L hip gluteus maximus tendon transfers and trochanteric bursectomy.    Protocol followed.  She reported some increase in Lt knee pain with RLE swings into hip flex / ext with LUE on wall; reduced with change in exericse. Overall good tolerance for gentle aquatic exercises thus far. She remains a good candidate for aquatic therapy and will benefit from the properties of water to progress towards land based goals.  Goals ongoing.    REHAB POTENTIAL: Good  CLINICAL DECISION MAKING: Stable/uncomplicated  EVALUATION COMPLEXITY: Low   GOALS: Goals reviewed with patient? Yes  SHORT TERM GOALS: Target date: 10/07/23 (4 wks post op)  Able to demo gait with proper pattern for houshold distances without AD Baseline: Goal status: Met - 10/23/23  2.  Hip flexion to 90 deg without pain Baseline:  Goal status:Met- 09/22/23  3.  Good quad and glut activation in CKC Baseline:  Goal status: INITIAL   LONG TERM GOALS: Target date: POC Date  Perform stairs step over step without pain Baseline:  Goal status: INITIAL  2.  MMT 90% of opposite lower extremity Baseline:  Goal status: INITIAL  3.  Ambulate community distances without pain Baseline:  Goal status: INITIAL  4.  Perform proper pelvic control in squat and SLS Baseline:  Goal status: INITIAL    PLAN:  PT FREQUENCY: 1-2x/week  PT DURATION: other: POC  date  PLANNED INTERVENTIONS: 97164- PT Re-evaluation, 97110-Therapeutic exercises, 97530- Therapeutic activity, 97112- Neuromuscular re-education, 97535- Self Care, 16109- Manual therapy, [phone]- Gait training, [phone]- Aquatic Therapy, Patient/Family education, Balance training, Stair training, Taping, Dry Needling, Joint mobilization, Spinal mobilization, Scar mobilization, Cryotherapy, and Moist heat.  PLAN FOR NEXT SESSION:  Continue per Dr. Serena Croissant glute med repair protocol.  Monitor L knee and ITB pain.  Pt to continue aquatic therapy.    Mayer Camel, PTA 10/23/23 11:57 AM [facility] [address] [place], [place], [address] Phone: [phone]   Fax:  [phone]

## 2023-10-28 ENCOUNTER — Encounter (HOSPITAL_BASED_OUTPATIENT_CLINIC_OR_DEPARTMENT_OTHER): Payer: Self-pay | Admitting: Physical Therapy

## 2023-10-28 ENCOUNTER — Ambulatory Visit (HOSPITAL_BASED_OUTPATIENT_CLINIC_OR_DEPARTMENT_OTHER): Payer: PPO | Admitting: Physical Therapy

## 2023-10-28 DIAGNOSIS — M25552 Pain in left hip: Secondary | ICD-10-CM

## 2023-10-28 DIAGNOSIS — M6281 Muscle weakness (generalized): Secondary | ICD-10-CM

## 2023-10-28 DIAGNOSIS — R262 Difficulty in walking, not elsewhere classified: Secondary | ICD-10-CM

## 2023-10-28 NOTE — Therapy (Signed)
 OUTPATIENT PHYSICAL THERAPY TREATMENT Progress Note Reporting Period 09/11/23 to 10/28/23  See note below for Objective Data and Assessment of Progress/Goals.      Patient Name: Emily [name] MRN: [id number] DOB:1940-12-31, 83 y.o., female Today's Date: 10/28/2023  END OF SESSION:  PT End of Session - 10/28/23 1327     Visit Number 10    Number of Visits 28    Date for PT Re-Evaluation 12/20/23    Authorization Type HTA, no VL    PT Start Time 1016    PT Stop Time 1056    PT Time Calculation (min) 40 min    Activity Tolerance Patient tolerated treatment well    Behavior During Therapy WFL for tasks assessed/performed                     Past Medical History:  Diagnosis Date   Actinic keratosis    Allergic rhinitis    BACK PAIN    CARCINOMA, BREAST, 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   L s/p lumpectomy, chemo and XRT   Cervicalgia    Cholelithiasis    DIVERTICULOSIS, COLON    ENDOMETRIOSIS    Heart palpitations    HYPOTHYROIDISM    Left wrist fracture 11/06/2016   10/2016 - fell of high stool   Osteoporosis    Palpitations    Personal history of chemotherapy    Personal history of radiation therapy    Pleural plaque without asbestos    CT chest 10/2010: R>L lobes, upper> lower   Renal disorder    Squamous cell carcinoma    History of BC   Past Surgical History:  Procedure Laterality Date   BREAST LUMPECTOMY  2001   left   CATARACT EXTRACTION Bilateral 09/2014   both eyes   CHOLECYSTECTOMY  2010   ENDOMETRIAL ABLATION     EXCISION / BIOPSY BREAST / NIPPLE / DUCT Left 2001   GLUTEUS MINIMUS REPAIR Left 09/09/2023   Procedure: LEFT GLUTEUS MAXIMUS TENDON TRANSFER REPAIR;  Surgeon: Huel Cote, MD;  Location: [facility];  Service: Orthopedics;  Laterality: Left;   Patient Active Problem List   Diagnosis Date Noted   Tendinopathy of left gluteus medius 09/09/2023   Acute cystitis 04/16/2023   Change in bowel habits 01/15/2023    Anxiety 01/01/2023   Agatston coronary artery calcium score 22.4  (12/2021) 10/30/2022   Urge incontinence of urine 10/30/2022   Word finding difficulty 10/30/2022   Chronic pain of left knee 08/21/2022   Lumbar radiculopathy 03/25/2022   Cough 12/27/2021   Sleep difficulties 09/27/2021   Cervicogenic headache 04/10/2021   Elevated blood pressure reading 04/10/2021   Skin abnormality 02/27/2021   Aortic atherosclerosis (HCC) 09/22/2020   Urinary urgency 03/23/2020   CKD (chronic kidney disease) stage 3, GFR 30-59 ml/min (HCC) 03/22/2020   Prediabetes 03/22/2020   Change in stool 11/17/2019   Right-sided chest wall pain 11/17/2019   Urinary retention 09/23/2019   SOB (shortness of breath) 01/15/2019   Upper airway cough syndrome 01/15/2019   Belching 12/14/2018   Reactive airway disease 12/05/2018   Tear of left rotator cuff 10/05/2018   Pre-syncope 05/19/2018   DDD (degenerative disc disease), cervical 01/15/2018   Neuralgia 09/12/2017   Presbycusis of both ears 12/11/2016   Memory difficulties 11/06/2016   IBS (irritable bowel syndrome) 04/16/2016   Hyperlipidemia 09/10/2015   Osteoporosis 09/08/2015   Palpitations    Pleural plaque without asbestos 12/03/2010   Chronic neck pain 12/18/2009  Diverticulosis of colon 03/02/2009   Lumbago 03/11/2008   Actinic keratosis 01/23/2008   Malignant neoplasm of female breast (HCC) 10/12/2007   Hypothyroidism 06/18/2007   Allergic rhinitis 06/11/2007      REFERRING PROVIDER:  Huel Cote, MD   REFERRING DIAG:  [id number] (ICD-10-CM) - Tendinopathy of left gluteus medius    s/p Lt GMR  Rationale for Evaluation and Treatment: Rehabilitation  THERAPY DIAG:  Pain in left hip  Difficulty in walking, not elsewhere classified  Muscle weakness (generalized)  ONSET DATE: DOS 09/09/23   SUBJECTIVE:                                                                                                                                                                                            SUBJECTIVE STATEMENT: Pt states she is doing well.  Pain low     PERTINENT HISTORY:  Urge incontinence, chronic Lt hip & knee pain, OP, bilat occipital neuralgia  PAIN:  Are you having pain? Yes: NPRS scale: 2/10 L hip (when walking) and 0/10 in lateral L knee Pain location: see above Pain description: ache, dull  Aggravating factors: constant Relieving factors: meds  PRECAUTIONS:  None  RED FLAGS: None   WEIGHT BEARING RESTRICTIONS:  Yes WBAT  FALLS:  Has patient fallen in last 6 months? No  LIVING ENVIRONMENT: 2 steps to enter home with handrail on Rt when ascending Does not need to go upstairs- bedroom is on the first floor  OCCUPATION:  retired  PLOF:  Independent  PATIENT GOALS:  Walk without pain and limping, stairs, water walking, I have a treadmill at home.    OBJECTIVE:  Note: Objective measures were completed at Evaluation unless otherwise noted.                                                                                                                              TREATMENT DATE:  [facility] Adult PT Treatment:  DATE: 10/28/23 Pt seen for aquatic therapy today.  Treatment took place in water 3.5-4.75 ft in depth at the [facility] pool. Temp of water was 91.  Pt entered/exited the pool via stairs using step-to pattern with hand rail.  *Unsupported: Walking forward ;  backward ;  side stepping .  * arm addct/ abdct with rainbow hand floats x 2 laps. Cues for abd bracing *Forward step ups x 10 R/L without UE support->runners step up with ue support x 10 R/L *side step ups R/L x 10 *Forward marching ue support barbell *standing ue support barbell: df; pf high knee marching (cues for left hip flex as tolerated), 3 way kick *3 way stretch using noodle.  Pt requires the buoyancy and hydrostatic pressure of water for support, and to  offload joints by unweighting joint load by at least 50 % in navel deep water and by at least 75-80% in chest to neck deep water.  Viscosity of the water is needed for resistance of strengthening. Water current perturbations provides challenge to standing balance requiring increased core activation.     PATIENT EDUCATION:  Education details:  POC and ice usage.   Person educated: Patient Education method: Explanation Education comprehension: verbalized understanding  HOME EXERCISE PROGRAM: [id number]   ASSESSMENT:  CLINICAL IMPRESSION: Pt is 7 weeks s/p L hip gluteus maximus tendon transfers and trochanteric bursectomy. She reports some increase in left ITB and lateral HS tendon.  Worked to IT trainer with some success.  Followed protocol with good toleration. She has met her STG # 1&2. She is ambulating FWBing without AD and reports no limitation to distance due to pain. She has L hip flex range > 90 submerged and is progressing well through protocol.  She will continue to benefit from skilled PT intervention both aquatics and land following protocol.    REHAB POTENTIAL: Good  CLINICAL DECISION MAKING: Stable/uncomplicated  EVALUATION COMPLEXITY: Low   GOALS: Goals reviewed with patient? Yes  SHORT TERM GOALS: Target date: 10/07/23 (4 wks post op)  Able to demo gait with proper pattern for houshold distances without AD Baseline: Goal status: Met - 10/23/23  2.  Hip flexion to 90 deg without pain Baseline:  Goal status:Met- 09/22/23  3.  Good quad and glut activation in CKC Baseline:  Goal status: INITIAL   LONG TERM GOALS: Target date: POC Date  Perform stairs step over step without pain Baseline:  Goal status: INITIAL  2.  MMT 90% of opposite lower extremity Baseline:  Goal status: INITIAL  3.  Ambulate community distances without pain Baseline:  Goal status: INITIAL  4.  Perform proper pelvic control in squat and SLS Baseline:  Goal status:  INITIAL    PLAN:  PT FREQUENCY: 1-2x/week  PT DURATION: other: POC date  PLANNED INTERVENTIONS: 97164- PT Re-evaluation, 97110-Therapeutic exercises, 97530- Therapeutic activity, 97112- Neuromuscular re-education, 97535- Self Care, 16109- Manual therapy, [phone]- Gait training, [phone]- Aquatic Therapy, Patient/Family education, Balance training, Stair training, Taping, Dry Needling, Joint mobilization, Spinal mobilization, Scar mobilization, Cryotherapy, and Moist heat.  PLAN FOR NEXT SESSION:  Continue per Dr. Serena Croissant glute med repair protocol.  Monitor L knee and ITB pain.  Pt to continue aquatic therapy.    Corrie Dandy Shubuta) Rowan Pollman MPT 10/28/23 1:49 PM [facility] [address] [place], [place], [address] Phone: [phone]   Fax:  [phone]

## 2023-10-29 ENCOUNTER — Other Ambulatory Visit: Payer: Self-pay | Admitting: Internal Medicine

## 2023-10-29 DIAGNOSIS — Z1231 Encounter for screening mammogram for malignant neoplasm of breast: Secondary | ICD-10-CM

## 2023-10-30 ENCOUNTER — Ambulatory Visit (HOSPITAL_BASED_OUTPATIENT_CLINIC_OR_DEPARTMENT_OTHER): Payer: PPO | Admitting: Physical Therapy

## 2023-10-30 ENCOUNTER — Encounter (HOSPITAL_BASED_OUTPATIENT_CLINIC_OR_DEPARTMENT_OTHER): Payer: Self-pay | Admitting: Physical Therapy

## 2023-10-30 ENCOUNTER — Ambulatory Visit (HOSPITAL_BASED_OUTPATIENT_CLINIC_OR_DEPARTMENT_OTHER): Payer: Self-pay | Admitting: Physical Therapy

## 2023-10-30 DIAGNOSIS — R262 Difficulty in walking, not elsewhere classified: Secondary | ICD-10-CM

## 2023-10-30 DIAGNOSIS — M25552 Pain in left hip: Secondary | ICD-10-CM

## 2023-10-30 DIAGNOSIS — M6281 Muscle weakness (generalized): Secondary | ICD-10-CM

## 2023-10-30 NOTE — Progress Notes (Unsigned)
    Subjective:    Patient ID: Emily [name], female    DOB: 1941/06/23, 83 y.o.   MRN: [id number]      HPI Emily [name] is here for No chief complaint on file.   Difficulty sleeping, thyroid check -      Medications and allergies reviewed with patient and updated if appropriate.  Current Outpatient Medications on File Prior to Visit  Medication Sig Dispense Refill   atenolol (TENORMIN) 25 MG tablet TAKE 1 TABLET BY MOUTH EVERY DAY 90 tablet 2   Calcium Carb-Cholecalciferol (CALCIUM 600 + D PO) Take by mouth.     cetirizine-pseudoephedrine (ZYRTEC-D) 5-120 MG tablet Take 1 tablet by mouth 2 (two) times daily as needed for allergies.     chlorpheniramine (CHLOR-TRIMETON) 4 MG tablet Take 4 mg by mouth 2 (two) times daily as needed for allergies. (Patient not taking: Reported on 09/15/2023)     cholecalciferol (VITAMIN D) 25 MCG (1000 UNIT) tablet Take 1,000 Units by mouth daily.     EPINEPHrine 0.3 mg/0.3 mL IJ SOAJ injection Inject 0.3 mg into the muscle as needed for anaphylaxis. 1 each 0   gabapentin (NEURONTIN) 100 MG capsule TAKE 1 CAPSULE (100 MG TOTAL) BY MOUTH AT BEDTIME. TAKE 1 TO 3 CAPSULES AT BEDTIME 30 capsule 3   levothyroxine (SYNTHROID) 75 MCG tablet TAKE 1 TABLET BY MOUTH EVERY DAY 90 tablet 3   ondansetron (ZOFRAN-ODT) 8 MG disintegrating tablet Take 1 tablet (8 mg total) by mouth every 8 (eight) hours as needed for nausea or vomiting. 10 tablet 0   oxyCODONE (ROXICODONE) 5 MG immediate release tablet Take 1 tablet (5 mg total) by mouth every 4 (four) hours as needed for severe pain (pain score 7-10) or breakthrough pain. (Patient not taking: Reported on 09/15/2023) 30 tablet 0   Current Facility-Administered Medications on File Prior to Visit  Medication Dose Route Frequency Provider Last Rate Last Admin   [START ON 02/02/2024] denosumab (PROLIA) injection 60 mg  60 mg Subcutaneous Once Pincus Sanes, MD        Review of Systems     Objective:  There were no  vitals filed for this visit. BP Readings from Last 3 Encounters:  09/15/23 118/72  09/09/23 (!) 166/88  07/10/23 (!) 141/90   Wt Readings from Last 3 Encounters:  09/15/23 125 lb (56.7 kg)  09/09/23 123 lb 14.4 oz (56.2 kg)  05/21/23 124 lb (56.2 kg)   There is no height or weight on file to calculate BMI.    Physical Exam         Assessment & Plan:    See Problem List for Assessment and Plan of chronic medical problems.

## 2023-10-30 NOTE — Patient Instructions (Addendum)
      Blood work was ordered.       Medications changes include :   None    A referral was ordered and someone will call you to schedule an appointment.     Return in about 6 months (around 05/02/2024) for Physical Exam.

## 2023-10-30 NOTE — Therapy (Signed)
 OUTPATIENT PHYSICAL THERAPY TREATMENT  Patient Name: Emily [name] MRN: [id number] DOB:[birth year], 83 y.o., female Today's Date: 10/30/2023  END OF SESSION:  PT End of Session - 10/30/23 0722     Visit Number 11    Number of Visits 28    Date for PT Re-Evaluation 12/20/23    Authorization Type HTA, no VL    PT Start Time 0715    PT Stop Time 0755    PT Time Calculation (min) 40 min    Activity Tolerance Patient tolerated treatment well    Behavior During Therapy WFL for tasks assessed/performed            Past Medical History:  Diagnosis Date   Actinic keratosis    Allergic rhinitis    BACK PAIN    CARCINOMA, BREAST, 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   L s/p lumpectomy, chemo and XRT   Cervicalgia    Cholelithiasis    DIVERTICULOSIS, COLON    ENDOMETRIOSIS    Heart palpitations    HYPOTHYROIDISM    Left wrist fracture 11/06/2016   10/2016 - fell of high stool   Osteoporosis    Palpitations    Personal history of chemotherapy    Personal history of radiation therapy    Pleural plaque without asbestos    CT chest 10/2010: R>L lobes, upper> lower   Renal disorder    Squamous cell carcinoma    History of BC   Past Surgical History:  Procedure Laterality Date   BREAST LUMPECTOMY  2001   left   CATARACT EXTRACTION Bilateral 09/2014   both eyes   CHOLECYSTECTOMY  2010   ENDOMETRIAL ABLATION     EXCISION / BIOPSY BREAST / NIPPLE / DUCT Left 2001   GLUTEUS MINIMUS REPAIR Left 09/09/2023   Procedure: LEFT GLUTEUS MAXIMUS TENDON TRANSFER REPAIR;  Surgeon: Huel Cote, MD;  Location: [facility];  Service: Orthopedics;  Laterality: Left;   Patient Active Problem List   Diagnosis Date Noted   Tendinopathy of left gluteus medius 09/09/2023   Acute cystitis 04/16/2023   Change in bowel habits 01/15/2023   Anxiety 01/01/2023   Agatston coronary artery calcium score 22.4  (12/2021) 10/30/2022   Urge incontinence of urine 10/30/2022   Word finding  difficulty 10/30/2022   Chronic pain of left knee 08/21/2022   Lumbar radiculopathy 03/25/2022   Cough 12/27/2021   Sleep difficulties 09/27/2021   Cervicogenic headache 04/10/2021   Elevated blood pressure reading 04/10/2021   Skin abnormality 02/27/2021   Aortic atherosclerosis (HCC) 09/22/2020   Urinary urgency 03/23/2020   CKD (chronic kidney disease) stage 3, GFR 30-59 ml/min (HCC) 03/22/2020   Prediabetes 03/22/2020   Change in stool 11/17/2019   Right-sided chest wall pain 11/17/2019   Urinary retention 09/23/2019   SOB (shortness of breath) 01/15/2019   Upper airway cough syndrome 01/15/2019   Belching 12/14/2018   Reactive airway disease 12/05/2018   Tear of left rotator cuff 10/05/2018   Pre-syncope 05/19/2018   DDD (degenerative disc disease), cervical 01/15/2018   Neuralgia 09/12/2017   Presbycusis of both ears 12/11/2016   Memory difficulties 11/06/2016   IBS (irritable bowel syndrome) 04/16/2016   Hyperlipidemia 09/10/2015   Osteoporosis 09/08/2015   Palpitations    Pleural plaque without asbestos 12/03/2010   Chronic neck pain 12/18/2009   Diverticulosis of colon 03/02/2009   Lumbago 03/11/2008   Actinic keratosis 01/23/2008   Malignant neoplasm of female breast (HCC) 10/12/2007   Hypothyroidism 06/18/2007   Allergic rhinitis 06/11/2007  REFERRING PROVIDER:  Huel Cote, MD   REFERRING DIAG:  [phone] (ICD-10-CM) - Tendinopathy of left gluteus medius    s/p Lt GMR  Rationale for Evaluation and Treatment: Rehabilitation  THERAPY DIAG:  Pain in left hip  Difficulty in walking, not elsewhere classified  Muscle weakness (generalized)  ONSET DATE: DOS 09/09/23   SUBJECTIVE:                                                                                                                                                                                           SUBJECTIVE STATEMENT: "I feel great".  "I thought I was going to be sore and  needing to ice, but I wasn't"     PERTINENT HISTORY:  Urge incontinence, chronic Lt hip & knee pain, OP, bilat occipital neuralgia  PAIN:  Are you having pain?no: NPRS scale: 0/10 Pain location: Pain description:  Aggravating factors:  Relieving factors:   PRECAUTIONS:  None  RED FLAGS: None   WEIGHT BEARING RESTRICTIONS:  Yes WBAT  FALLS:  Has patient fallen in last 6 months? No  LIVING ENVIRONMENT: 2 steps to enter home with handrail on Rt when ascending Does not need to go upstairs- bedroom is on the first floor  OCCUPATION:  retired  PLOF:  Independent  PATIENT GOALS:  Walk without pain and limping, stairs, water walking, I have a treadmill at home.    OBJECTIVE:  Note: Objective measures were completed at Evaluation unless otherwise noted.                                                                                                                              TREATMENT DATE:  [facility] Adult PT Treatment:                                                DATE: 10/30/23 Pt seen for aquatic therapy today.  Treatment took place in water 3.5-4.75 ft in depth at the  [facility] pool. Temp of water was 91.  Pt entered/exited the pool via stairs using step-to pattern with hand rail.  *Unsupported: Walking forward ;  backward ;  side stepping  * arm addct/ abdct with rainbow hand floats x 2 laps * UE on rainbow hand floats:  3 way LE kick (range to tolerance) 2 x 5 each LE; tandem gait backwards/ forwards 2 laps *backward/ forward marching with row motion with hand floats * side step ups with UE on rail x 10 each LE, cues for core engagement  * L stretch at wall for lower back relief *Forward runners step up with ue support,  x 9 LLE (increased pain, stopped)  *return to walking unsupported forward/ backward x 3 laps  *3 way LE stretch using hollow noodle at ankle, 10-15s each position, x 2 reps * L stretch at rails of stairs x 15s  Pt requires the  buoyancy and hydrostatic pressure of water for support, and to offload joints by unweighting joint load by at least 50 % in navel deep water and by at least 75-80% in chest to neck deep water.  Viscosity of the water is needed for resistance of strengthening. Water current perturbations provides challenge to standing balance requiring increased core activation.     PATIENT EDUCATION:  Education details:  aquatic therapy exercise progressions/ modifications  Person educated: Patient Education method: Explanation Education comprehension: verbalized understanding  HOME EXERCISE PROGRAM: [email or web address]   ASSESSMENT:  CLINICAL IMPRESSION: Pt is 7 weeks 2 days s/p L hip gluteus maximus tendon transfers and trochanteric bursectomy. She reported improved tolerance for hip abdct and hip flexion.  Some mild back discomfort reported with L lateral step ups, and L hip pain with forward runners step ups (leading with LLE); eased with return to walking. Lt Adductors appear tight with LE stretch on noodle.  She  is progressing well through protocol.  She will continue to benefit from skilled PT intervention both aquatics and land following protocol.    REHAB POTENTIAL: Good  CLINICAL DECISION MAKING: Stable/uncomplicated  EVALUATION COMPLEXITY: Low   GOALS: Goals reviewed with patient? Yes  SHORT TERM GOALS: Target date: 10/07/23 (4 wks post op)  Able to demo gait with proper pattern for houshold distances without AD Baseline: Goal status: Met - 10/23/23  2.  Hip flexion to 90 deg without pain Baseline:  Goal status:Met- 09/22/23  3.  Good quad and glut activation in CKC Baseline:  Goal status: INITIAL   LONG TERM GOALS: Target date: POC Date  Perform stairs step over step without pain Baseline:  Goal status: In progress - 10/30/23  2.  MMT 90% of opposite lower extremity Baseline:  Goal status: INITIAL  3.  Ambulate community distances without pain Baseline:  Goal status:  INITIAL  4.  Perform proper pelvic control in squat and SLS Baseline:  Goal status: INITIAL    PLAN:  PT FREQUENCY: 1-2x/week  PT DURATION: other: POC date  PLANNED INTERVENTIONS: 97164- PT Re-evaluation, 97110-Therapeutic exercises, 97530- Therapeutic activity, 97112- Neuromuscular re-education, 97535- Self Care, 40981- Manual therapy, [phone]- Gait training, [phone]- Aquatic Therapy, Patient/Family education, Balance training, Stair training, Taping, Dry Needling, Joint mobilization, Spinal mobilization, Scar mobilization, Cryotherapy, and Moist heat.  PLAN FOR NEXT SESSION:  Continue per Dr. Serena Croissant glute med repair protocol.  Monitor L knee and ITB pain.  Pt to continue aquatic therapy.    Mayer Camel, PTA 10/30/23 7:53 AM [facility] [address] [place], [place],  [address] Phone: [phone]   Fax:  [phone]

## 2023-10-31 ENCOUNTER — Ambulatory Visit: Admitting: Internal Medicine

## 2023-10-31 ENCOUNTER — Encounter: Payer: Self-pay | Admitting: Internal Medicine

## 2023-10-31 VITALS — BP 122/70 | HR 54 | Temp 97.9°F | Ht 60.0 in | Wt 123.0 lb

## 2023-10-31 DIAGNOSIS — R7303 Prediabetes: Secondary | ICD-10-CM

## 2023-10-31 DIAGNOSIS — E782 Mixed hyperlipidemia: Secondary | ICD-10-CM

## 2023-10-31 DIAGNOSIS — R002 Palpitations: Secondary | ICD-10-CM

## 2023-10-31 DIAGNOSIS — G2581 Restless legs syndrome: Secondary | ICD-10-CM | POA: Insufficient documentation

## 2023-10-31 DIAGNOSIS — E038 Other specified hypothyroidism: Secondary | ICD-10-CM | POA: Diagnosis not present

## 2023-10-31 DIAGNOSIS — E559 Vitamin D deficiency, unspecified: Secondary | ICD-10-CM | POA: Diagnosis not present

## 2023-10-31 DIAGNOSIS — G479 Sleep disorder, unspecified: Secondary | ICD-10-CM | POA: Diagnosis not present

## 2023-10-31 LAB — T4, FREE: Free T4: 0.91 ng/dL (ref 0.60–1.60)

## 2023-10-31 LAB — CBC WITH DIFFERENTIAL/PLATELET
Basophils Absolute: 0.1 10*3/uL (ref 0.0–0.1)
Basophils Relative: 0.6 % (ref 0.0–3.0)
Eosinophils Absolute: 0.2 10*3/uL (ref 0.0–0.7)
Eosinophils Relative: 1.3 % (ref 0.0–5.0)
HCT: 46.1 % — ABNORMAL HIGH (ref 36.0–46.0)
Hemoglobin: 15 g/dL (ref 12.0–15.0)
Lymphocytes Relative: 21.4 % (ref 12.0–46.0)
Lymphs Abs: 2.4 10*3/uL (ref 0.7–4.0)
MCHC: 32.6 g/dL (ref 30.0–36.0)
MCV: 92.9 fl (ref 78.0–100.0)
Monocytes Absolute: 1 10*3/uL (ref 0.1–1.0)
Monocytes Relative: 8.5 % (ref 3.0–12.0)
Neutro Abs: 7.7 10*3/uL (ref 1.4–7.7)
Neutrophils Relative %: 68.2 % (ref 43.0–77.0)
Platelets: 239 10*3/uL (ref 150.0–400.0)
RBC: 4.96 Mil/uL (ref 3.87–5.11)
RDW: 14 % (ref 11.5–15.5)
WBC: 11.4 10*3/uL — ABNORMAL HIGH (ref 4.0–10.5)

## 2023-10-31 LAB — T3, FREE: T3, Free: 2.7 pg/mL (ref 2.3–4.2)

## 2023-10-31 LAB — COMPREHENSIVE METABOLIC PANEL
ALT: 15 U/L (ref 0–35)
AST: 21 U/L (ref 0–37)
Albumin: 4.5 g/dL (ref 3.5–5.2)
Alkaline Phosphatase: 63 U/L (ref 39–117)
BUN: 25 mg/dL — ABNORMAL HIGH (ref 6–23)
CO2: 27 meq/L (ref 19–32)
Calcium: 10.3 mg/dL (ref 8.4–10.5)
Chloride: 104 meq/L (ref 96–112)
Creatinine, Ser: 0.94 mg/dL (ref 0.40–1.20)
GFR: 56.96 mL/min — ABNORMAL LOW (ref >60.00)
Glucose, Bld: 91 mg/dL (ref 70–99)
Potassium: 4.4 meq/L (ref 3.5–5.1)
Sodium: 139 meq/L (ref 135–145)
Total Bilirubin: 0.6 mg/dL (ref 0.2–1.2)
Total Protein: 7.6 g/dL (ref 6.0–8.3)

## 2023-10-31 LAB — LIPID PANEL
Cholesterol: 244 mg/dL — ABNORMAL HIGH (ref 0–200)
HDL: 63.7 mg/dL (ref >39.00)
LDL Cholesterol: 161 mg/dL — ABNORMAL HIGH (ref 0–99)
NonHDL: 180.63
Total CHOL/HDL Ratio: 4
Triglycerides: 100 mg/dL (ref 0.0–149.0)
VLDL: 20 mg/dL (ref 0.0–40.0)

## 2023-10-31 LAB — VITAMIN D 25 HYDROXY (VIT D DEFICIENCY, FRACTURES): VITD: 37.51 ng/mL (ref 30.00–100.00)

## 2023-10-31 LAB — HEMOGLOBIN A1C: Hgb A1c MFr Bld: 6.1 % (ref 4.6–6.5)

## 2023-10-31 LAB — TSH: TSH: 3.55 u[IU]/mL (ref 0.35–5.50)

## 2023-10-31 LAB — FERRITIN: Ferritin: 151.4 ng/mL (ref 10.0–291.0)

## 2023-10-31 MED ORDER — CHLORPHENIRAMINE MALEATE 4 MG PO TABS: 4.0000 mg | ORAL_TABLET | Freq: Two times a day (BID) | ORAL | Status: AC | PRN

## 2023-10-31 NOTE — Assessment & Plan Note (Addendum)
 Chronic Lab Results  Component Value Date   HGBA1C 6.2 10/28/2022   Sugars in prediabetic range Check A1c Encouraged regular activity/exercise Low sugar/carbohydrate diet

## 2023-10-31 NOTE — Assessment & Plan Note (Addendum)
 New Started just after surgery ?  Related to mild anemia/low iron ?  Related to chlorphentermine which she is taking for allergies Never had it before so more likely to be secondary then primary in nature Will check TFTs, CBC, CMP, iron panel Increase gabapentin to 200 mg at night to see if that helps Depending on blood work may need iron supplementation Also can consider low-dose Requip

## 2023-10-31 NOTE — Assessment & Plan Note (Addendum)
 Chronic Seems to be euthyroid ?  Restless leg symptoms related to thyroid Check TFTs Continue current dose of levothyroxine-75 mcg daily

## 2023-10-31 NOTE — Assessment & Plan Note (Signed)
 Chronic Taking vitamin D daily Check vitamin D level

## 2023-10-31 NOTE — Assessment & Plan Note (Signed)
Chronic Controlled, Stable Continue atenolol 25 mg daily - likely helping to keep BP controlled

## 2023-10-31 NOTE — Assessment & Plan Note (Signed)
 New Related to RLS symptoms that started after her gluteal surgery which was the end of January Will treat RLS which should help sleep

## 2023-10-31 NOTE — Assessment & Plan Note (Addendum)
 Chronic Check lipid panel  Continue lifestyle control given low coronary calcium score (22.4 in May 2023) Scheduled for her physical exam.

## 2023-11-02 ENCOUNTER — Other Ambulatory Visit: Payer: Self-pay | Admitting: Internal Medicine

## 2023-11-02 ENCOUNTER — Encounter: Payer: Self-pay | Admitting: Internal Medicine

## 2023-11-02 MED ORDER — LEVOTHYROXINE SODIUM 75 MCG PO TABS: ORAL_TABLET | ORAL | 3 refills | Status: AC

## 2023-11-03 DIAGNOSIS — M47812 Spondylosis without myelopathy or radiculopathy, cervical region: Secondary | ICD-10-CM | POA: Diagnosis not present

## 2023-11-04 ENCOUNTER — Encounter (HOSPITAL_BASED_OUTPATIENT_CLINIC_OR_DEPARTMENT_OTHER): Payer: Self-pay | Admitting: Physical Therapy

## 2023-11-04 ENCOUNTER — Ambulatory Visit (HOSPITAL_BASED_OUTPATIENT_CLINIC_OR_DEPARTMENT_OTHER): Payer: PPO | Admitting: Physical Therapy

## 2023-11-04 DIAGNOSIS — M6281 Muscle weakness (generalized): Secondary | ICD-10-CM

## 2023-11-04 DIAGNOSIS — R262 Difficulty in walking, not elsewhere classified: Secondary | ICD-10-CM

## 2023-11-04 DIAGNOSIS — M25552 Pain in left hip: Secondary | ICD-10-CM

## 2023-11-04 NOTE — Therapy (Signed)
 OUTPATIENT PHYSICAL THERAPY TREATMENT  Patient Name: Emily [name] MRN: [id number] DOB:1940-12-31, 83 y.o., female Today's Date: 11/04/2023  END OF SESSION:  PT End of Session - 11/04/23 1004     Visit Number 12    Number of Visits 28    Date for PT Re-Evaluation 12/20/23    Authorization Type HTA, no VL    PT Start Time 1002    PT Stop Time 1042    PT Time Calculation (min) 40 min    Activity Tolerance Patient tolerated treatment well    Behavior During Therapy WFL for tasks assessed/performed            Past Medical History:  Diagnosis Date   Actinic keratosis    Allergic rhinitis    BACK PAIN    CARCINOMA, BREAST, 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   L s/p lumpectomy, chemo and XRT   Cervicalgia    Cholelithiasis    DIVERTICULOSIS, COLON    ENDOMETRIOSIS    Heart palpitations    HYPOTHYROIDISM    Left wrist fracture 11/06/2016   10/2016 - fell of high stool   Osteoporosis    Palpitations    Personal history of chemotherapy    Personal history of radiation therapy    Pleural plaque without asbestos    CT chest 10/2010: R>L lobes, upper> lower   Renal disorder    Squamous cell carcinoma    History of BC   Past Surgical History:  Procedure Laterality Date   BREAST LUMPECTOMY  2001   left   CATARACT EXTRACTION Bilateral 09/2014   both eyes   CHOLECYSTECTOMY  2010   ENDOMETRIAL ABLATION     EXCISION / BIOPSY BREAST / NIPPLE / DUCT Left 2001   GLUTEUS MINIMUS REPAIR Left 09/09/2023   Procedure: LEFT GLUTEUS MAXIMUS TENDON TRANSFER REPAIR;  Surgeon: Huel Cote, MD;  [facility];  Service: Orthopedics;  Laterality: Left;   Patient Active Problem List   Diagnosis Date Noted   RLS (restless legs syndrome) 10/31/2023   Vitamin D deficiency 10/31/2023   Tendinopathy of left gluteus medius 09/09/2023   Acute cystitis 04/16/2023   Change in bowel habits 01/15/2023   Anxiety 01/01/2023   Agatston coronary artery calcium score 22.4   (12/2021) 10/30/2022   Urge incontinence of urine 10/30/2022   Word finding difficulty 10/30/2022   Chronic pain of left knee 08/21/2022   Lumbar radiculopathy 03/25/2022   Cough 12/27/2021   Sleep difficulties 09/27/2021   Cervicogenic headache 04/10/2021   Elevated blood pressure reading 04/10/2021   Skin abnormality 02/27/2021   Aortic atherosclerosis (HCC) 09/22/2020   Urinary urgency 03/23/2020   CKD (chronic kidney disease) stage 3, GFR 30-59 ml/min (HCC) 03/22/2020   Prediabetes 03/22/2020   Change in stool 11/17/2019   Right-sided chest wall pain 11/17/2019   Urinary retention 09/23/2019   SOB (shortness of breath) 01/15/2019   Upper airway cough syndrome 01/15/2019   Belching 12/14/2018   Reactive airway disease 12/05/2018   Tear of left rotator cuff 10/05/2018   Pre-syncope 05/19/2018   DDD (degenerative disc disease), cervical 01/15/2018   Neuralgia 09/12/2017   Presbycusis of both ears 12/11/2016   Memory difficulties 11/06/2016   IBS (irritable bowel syndrome) 04/16/2016   Hyperlipidemia 09/10/2015   Osteoporosis 09/08/2015   Palpitations    Pleural plaque without asbestos 12/03/2010   Chronic neck pain 12/18/2009   Diverticulosis of colon 03/02/2009   Lumbago 03/11/2008   Actinic keratosis 01/23/2008   Malignant neoplasm of  female breast (HCC) 10/12/2007   Hypothyroidism 06/18/2007   Allergic rhinitis 06/11/2007      REFERRING PROVIDER:  Huel Cote, MD   REFERRING DIAG:  [phone] (ICD-10-CM) - Tendinopathy of left gluteus medius    s/p Lt GMR  Rationale for Evaluation and Treatment: Rehabilitation  THERAPY DIAG:  Pain in left hip  Difficulty in walking, not elsewhere classified  Muscle weakness (generalized)  ONSET DATE: DOS 09/09/23   SUBJECTIVE:                                                                                                                                                                                            SUBJECTIVE STATEMENT: "I feel great".  "I thought I was going to be sore and needing to ice, but I wasn't"     PERTINENT HISTORY:  Urge incontinence, chronic Lt hip & knee pain, OP, bilat occipital neuralgia  PAIN:  Are you having pain?no: NPRS scale: 0/10 Pain location: Pain description:  Aggravating factors:  Relieving factors:   PRECAUTIONS:  None  RED FLAGS: None   WEIGHT BEARING RESTRICTIONS:  Yes WBAT  FALLS:  Has patient fallen in last 6 months? No  LIVING ENVIRONMENT: 2 steps to enter home with handrail on Rt when ascending Does not need to go upstairs- bedroom is on the first floor  OCCUPATION:  retired  PLOF:  Independent  PATIENT GOALS:  Walk without pain and limping, stairs, water walking, I have a treadmill at home.    OBJECTIVE:  Note: Objective measures were completed at Evaluation unless otherwise noted.                                                                                                                              TREATMENT DATE:  [facility] Adult PT Treatment:                                                DATE: 10/30/23  Pt seen for aquatic therapy today.  Treatment took place in water 3.5-4.75 ft in depth at the [facility] pool. Temp of water was 91.  Pt entered/exited the pool via stairs using step-to pattern with hand rail.  *Unsupported: Walking forward ;  backward ;  side stepping  * arm addct/ abdct with rainbow hand floats x 2 laps  * UE on rainbow hand floats:  3 way LE kick (range to tolerance) x8 each LE; tandem gait backwards/ forwards 2 laps * L stretch at rails of stairs x 15s *3 way LE stretch using hollow noodle at ankle, 10-15s each position, x 2 reps *backward/ forward marching with hand floats *Forward marching with knee kick x 3 widths * side step ups with UE on rail x 10 each LE, cues for core engagement  *forward step up leading R/L x 7 *Forward runners step up with ue support,x7 leading  right *cycling on noodle ue support corner wall: hip flex/ext; add/abd *return to walking unsupported forward/ backward between exercises * L stretch at wall for lower back relief throughout session   Pt requires the buoyancy and hydrostatic pressure of water for support, and to offload joints by unweighting joint load by at least 50 % in navel deep water and by at least 75-80% in chest to neck deep water.  Viscosity of the water is needed for resistance of strengthening. Water current perturbations provides challenge to standing balance requiring increased core activation.     PATIENT EDUCATION:  Education details:  aquatic therapy exercise progressions/ modifications  Person educated: Patient Education method: Explanation Education comprehension: verbalized understanding  HOME EXERCISE PROGRAM: [email or web address]   ASSESSMENT:  CLINICAL IMPRESSION: Pt is 8 weeks s/p L hip gluteus maximus tendon transfers and trochanteric bursectomy. She reports no hip pain but some continued limitation due to left knee pain.  Progressed nicely through protocol/session. Added suspended cycling and buoyancy assisted full range hip extension and abd. Worked on stretching left hip adductors which continue to be tight.  Goals ongoing      REHAB POTENTIAL: Good  CLINICAL DECISION MAKING: Stable/uncomplicated  EVALUATION COMPLEXITY: Low   GOALS: Goals reviewed with patient? Yes  SHORT TERM GOALS: Target date: 10/07/23 (4 wks post op)  Able to demo gait with proper pattern for houshold distances without AD Baseline: Goal status: Met - 10/23/23  2.  Hip flexion to 90 deg without pain Baseline:  Goal status:Met- 09/22/23  3.  Good quad and glut activation in CKC Baseline:  Goal status: INITIAL   LONG TERM GOALS: Target date: POC Date  Perform stairs step over step without pain Baseline:  Goal status: In progress - 10/30/23  2.  MMT 90% of opposite lower extremity Baseline:  Goal status:  INITIAL  3.  Ambulate community distances without pain Baseline:  Goal status: INITIAL  4.  Perform proper pelvic control in squat and SLS Baseline:  Goal status: INITIAL    PLAN:  PT FREQUENCY: 1-2x/week  PT DURATION: other: POC date  PLANNED INTERVENTIONS: 97164- PT Re-evaluation, 97110-Therapeutic exercises, 97530- Therapeutic activity, 97112- Neuromuscular re-education, 97535- Self Care, 82956- Manual therapy, [phone]- Gait training, [phone]- Aquatic Therapy, Patient/Family education, Balance training, Stair training, Taping, Dry Needling, Joint mobilization, Spinal mobilization, Scar mobilization, Cryotherapy, and Moist heat.  PLAN FOR NEXT SESSION:  Continue per Dr. Serena Croissant glute med repair protocol.  Monitor L knee and ITB pain.  Pt to continue aquatic therapy.    Corrie Dandy Tomma Lightning) Mally Gavina MPT 11/04/23 10:06 AM [facility] [address] [place], [place], [address] Phone: [phone]   Fax:  [phone]

## 2023-11-06 ENCOUNTER — Ambulatory Visit (HOSPITAL_BASED_OUTPATIENT_CLINIC_OR_DEPARTMENT_OTHER): Payer: PPO | Admitting: Physical Therapy

## 2023-11-06 ENCOUNTER — Encounter (HOSPITAL_BASED_OUTPATIENT_CLINIC_OR_DEPARTMENT_OTHER): Payer: Self-pay | Admitting: Physical Therapy

## 2023-11-06 DIAGNOSIS — M25552 Pain in left hip: Secondary | ICD-10-CM | POA: Diagnosis not present

## 2023-11-06 DIAGNOSIS — M5459 Other low back pain: Secondary | ICD-10-CM

## 2023-11-06 DIAGNOSIS — R262 Difficulty in walking, not elsewhere classified: Secondary | ICD-10-CM

## 2023-11-06 DIAGNOSIS — M6281 Muscle weakness (generalized): Secondary | ICD-10-CM

## 2023-11-06 NOTE — Therapy (Signed)
 OUTPATIENT PHYSICAL THERAPY TREATMENT  Patient Name: Emily [name] MRN: [id number] DOB:June 03, 1941, 83 y.o., female Today's Date: 11/06/2023  END OF SESSION:  PT End of Session - 11/06/23 1112     Visit Number 13    Number of Visits 28    Date for PT Re-Evaluation 12/20/23    Authorization Type HTA, no VL    PT Start Time 1017    PT Stop Time 1059    PT Time Calculation (min) 42 min    Activity Tolerance Patient tolerated treatment well    Behavior During Therapy WFL for tasks assessed/performed             Past Medical History:  Diagnosis Date   Actinic keratosis    Allergic rhinitis    BACK PAIN    CARCINOMA, BREAST, 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   L s/p lumpectomy, chemo and XRT   Cervicalgia    Cholelithiasis    DIVERTICULOSIS, COLON    ENDOMETRIOSIS    Heart palpitations    HYPOTHYROIDISM    Left wrist fracture 11/06/2016   10/2016 - fell of high stool   Osteoporosis    Palpitations    Personal history of chemotherapy    Personal history of radiation therapy    Pleural plaque without asbestos    CT chest 10/2010: R>L lobes, upper> lower   Renal disorder    Squamous cell carcinoma    History of BC   Past Surgical History:  Procedure Laterality Date   BREAST LUMPECTOMY  2001   left   CATARACT EXTRACTION Bilateral 09/2014   both eyes   CHOLECYSTECTOMY  2010   ENDOMETRIAL ABLATION     EXCISION / BIOPSY BREAST / NIPPLE / DUCT Left 2001   GLUTEUS MINIMUS REPAIR Left 09/09/2023   Procedure: LEFT GLUTEUS MAXIMUS TENDON TRANSFER REPAIR;  Surgeon: Huel Cote, MD;  Location: [facility];  Service: Orthopedics;  Laterality: Left;   Patient Active Problem List   Diagnosis Date Noted   RLS (restless legs syndrome) 10/31/2023   Vitamin D deficiency 10/31/2023   Tendinopathy of left gluteus medius 09/09/2023   Acute cystitis 04/16/2023   Change in bowel habits 01/15/2023   Anxiety 01/01/2023   Agatston coronary artery calcium score  22.4  (12/2021) 10/30/2022   Urge incontinence of urine 10/30/2022   Word finding difficulty 10/30/2022   Chronic pain of left knee 08/21/2022   Lumbar radiculopathy 03/25/2022   Cough 12/27/2021   Sleep difficulties 09/27/2021   Cervicogenic headache 04/10/2021   Elevated blood pressure reading 04/10/2021   Skin abnormality 02/27/2021   Aortic atherosclerosis (HCC) 09/22/2020   Urinary urgency 03/23/2020   CKD (chronic kidney disease) stage 3, GFR 30-59 ml/min (HCC) 03/22/2020   Prediabetes 03/22/2020   Change in stool 11/17/2019   Right-sided chest wall pain 11/17/2019   Urinary retention 09/23/2019   SOB (shortness of breath) 01/15/2019   Upper airway cough syndrome 01/15/2019   Belching 12/14/2018   Reactive airway disease 12/05/2018   Tear of left rotator cuff 10/05/2018   Pre-syncope 05/19/2018   DDD (degenerative disc disease), cervical 01/15/2018   Neuralgia 09/12/2017   Presbycusis of both ears 12/11/2016   Memory difficulties 11/06/2016   IBS (irritable bowel syndrome) 04/16/2016   Hyperlipidemia 09/10/2015   Osteoporosis 09/08/2015   Palpitations    Pleural plaque without asbestos 12/03/2010   Chronic neck pain 12/18/2009   Diverticulosis of colon 03/02/2009   Lumbago 03/11/2008   Actinic keratosis 01/23/2008   Malignant neoplasm  of female breast (HCC) 10/12/2007   Hypothyroidism 06/18/2007   Allergic rhinitis 06/11/2007      REFERRING PROVIDER:  Huel Cote, MD   REFERRING DIAG:  [phone] (ICD-10-CM) - Tendinopathy of left gluteus medius    s/p Lt GMR  Rationale for Evaluation and Treatment: Rehabilitation  THERAPY DIAG:  Pain in left hip  Difficulty in walking, not elsewhere classified  Muscle weakness (generalized)  Other low back pain  ONSET DATE: DOS 09/09/23   SUBJECTIVE:                                                                                                                                                                                            SUBJECTIVE STATEMENT: "I feel pretty good"     PERTINENT HISTORY:  Urge incontinence, chronic Lt hip & knee pain, OP, bilat occipital neuralgia  PAIN:  Are you having pain?no: NPRS scale: 0/10 Pain location: Pain description:  Aggravating factors:  Relieving factors:   PRECAUTIONS:  None  RED FLAGS: None   WEIGHT BEARING RESTRICTIONS:  Yes WBAT  FALLS:  Has patient fallen in last 6 months? No  LIVING ENVIRONMENT: 2 steps to enter home with handrail on Rt when ascending Does not need to go upstairs- bedroom is on the first floor  OCCUPATION:  retired  PLOF:  Independent  PATIENT GOALS:  Walk without pain and limping, stairs, water walking, I have a treadmill at home.    OBJECTIVE:  Note: Objective measures were completed at Evaluation unless otherwise noted.                                                                                                                              TREATMENT DATE:  [facility] Adult PT Treatment:                                                DATE: 11/06/23 Pt seen for aquatic therapy today.  Treatment took  place in water 3.5-4.75 ft in depth at the [facility] pool. Temp of water was 91.  Pt entered/exited the pool via stairs using step-to pattern with hand rail.  *Unsupported: Walking forward ;  backward ;  side stepping  * side lunge x 4 widths ue RBHB * L stretch at rails of stairs  *forward step up leading R/L x 10 *Forward runners step up with ue support,x10 leading R/L *UE support wall: curtsy squats x 10 * UE on rainbow hand floats:  3 way LE kick (range to tolerance) x10 each LE; toe raises; heel raises; hip abd/add crossing midline x10 (god sls challenge *Tandem stance x 15s-> ue add/abd x 10 (good challenge) *Forward marching with knee kick x 3 widths *hip hiking bottom step 2 x 10 * L stretch at wall for lower back relief throughout session  Pt requires the buoyancy and hydrostatic  pressure of water for support, and to offload joints by unweighting joint load by at least 50 % in navel deep water and by at least 75-80% in chest to neck deep water.  Viscosity of the water is needed for resistance of strengthening. Water current perturbations provides challenge to standing balance requiring increased core activation.     PATIENT EDUCATION:  Education details:  aquatic therapy exercise progressions/ modifications  Person educated: Patient Education method: Explanation Education comprehension: verbalized understanding  HOME EXERCISE PROGRAM: [email or web address]   ASSESSMENT:  CLINICAL IMPRESSION: Pt is 8 weeks 2 days s/p L hip gluteus maximus tendon transfers and trochanteric bursectomy. She demonstrates decreased left knee pain with exercises today improving toleration to hip engagement.Progressed proprioception and balance challenges which further engages left hip.  She has some difficulty with SLS  left with dynamic movement which does improve with extended time spent and muscle recruitment. Goals ongoing.       REHAB POTENTIAL: Good  CLINICAL DECISION MAKING: Stable/uncomplicated  EVALUATION COMPLEXITY: Low   GOALS: Goals reviewed with patient? Yes  SHORT TERM GOALS: Target date: 10/07/23 (4 wks post op)  Able to demo gait with proper pattern for houshold distances without AD Baseline: Goal status: Met - 10/23/23  2.  Hip flexion to 90 deg without pain Baseline:  Goal status:Met- 09/22/23  3.  Good quad and glut activation in CKC Baseline:  Goal status: INITIAL   LONG TERM GOALS: Target date: POC Date  Perform stairs step over step without pain Baseline:  Goal status: In progress - 10/30/23  2.  MMT 90% of opposite lower extremity Baseline:  Goal status: INITIAL  3.  Ambulate community distances without pain Baseline:  Goal status: INITIAL  4.  Perform proper pelvic control in squat and SLS Baseline:  Goal status: INITIAL    PLAN:  PT  FREQUENCY: 1-2x/week  PT DURATION: other: POC date  PLANNED INTERVENTIONS: 97164- PT Re-evaluation, 97110-Therapeutic exercises, 97530- Therapeutic activity, 97112- Neuromuscular re-education, 97535- Self Care, 19147- Manual therapy, [phone]- Gait training, [phone]- Aquatic Therapy, Patient/Family education, Balance training, Stair training, Taping, Dry Needling, Joint mobilization, Spinal mobilization, Scar mobilization, Cryotherapy, and Moist heat.  PLAN FOR NEXT SESSION:  Continue per Dr. Serena Croissant glute med repair protocol.  Monitor L knee and ITB pain.  Pt to continue aquatic therapy.    [address] [place]) Lisabeth Mian MPT 11/06/23 11:14 AM [facility] [address] [place], [place], [address] Phone: [phone]   Fax:  [phone]

## 2023-11-11 ENCOUNTER — Ambulatory Visit (HOSPITAL_BASED_OUTPATIENT_CLINIC_OR_DEPARTMENT_OTHER): Payer: PPO | Admitting: Physical Therapy

## 2023-11-11 ENCOUNTER — Encounter (HOSPITAL_BASED_OUTPATIENT_CLINIC_OR_DEPARTMENT_OTHER): Payer: Self-pay | Admitting: Physical Therapy

## 2023-11-11 DIAGNOSIS — M25552 Pain in left hip: Secondary | ICD-10-CM | POA: Diagnosis not present

## 2023-11-11 DIAGNOSIS — R262 Difficulty in walking, not elsewhere classified: Secondary | ICD-10-CM

## 2023-11-11 DIAGNOSIS — M6281 Muscle weakness (generalized): Secondary | ICD-10-CM

## 2023-11-11 NOTE — Therapy (Signed)
 OUTPATIENT PHYSICAL THERAPY TREATMENT  Patient Name: Emily [name] MRN: [id number] DOB:November 17, 1940, 83 y.o., female Today's Date: 11/11/2023  END OF SESSION:  PT End of Session - 11/11/23 1259     Visit Number 14    Number of Visits 28    Date for PT Re-Evaluation 12/20/23    Authorization Type HTA, no VL    PT Start Time 1016    PT Stop Time 1055    PT Time Calculation (min) 39 min    Activity Tolerance Patient tolerated treatment well    Behavior During Therapy WFL for tasks assessed/performed              Past Medical History:  Diagnosis Date   Actinic keratosis    Allergic rhinitis    BACK PAIN    CARCINOMA, BREAST, 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   L s/p lumpectomy, chemo and XRT   Cervicalgia    Cholelithiasis    DIVERTICULOSIS, COLON    ENDOMETRIOSIS    Heart palpitations    HYPOTHYROIDISM    Left wrist fracture 11/06/2016   10/2016 - fell of high stool   Osteoporosis    Palpitations    Personal history of chemotherapy    Personal history of radiation therapy    Pleural plaque without asbestos    CT chest 10/2010: R>L lobes, upper> lower   Renal disorder    Squamous cell carcinoma    History of BC   Past Surgical History:  Procedure Laterality Date   BREAST LUMPECTOMY  2001   left   CATARACT EXTRACTION Bilateral 09/2014   both eyes   CHOLECYSTECTOMY  2010   ENDOMETRIAL ABLATION     EXCISION / BIOPSY BREAST / NIPPLE / DUCT Left 2001   GLUTEUS MINIMUS REPAIR Left 09/09/2023   Procedure: LEFT GLUTEUS MAXIMUS TENDON TRANSFER REPAIR;  Surgeon: Huel Cote, MD;  [facility];  Service: Orthopedics;  Laterality: Left;   Patient Active Problem List   Diagnosis Date Noted   RLS (restless legs syndrome) 10/31/2023   Vitamin D deficiency 10/31/2023   Tendinopathy of left gluteus medius 09/09/2023   Acute cystitis 04/16/2023   Change in bowel habits 01/15/2023   Anxiety 01/01/2023   Agatston coronary artery calcium score  22.4  (12/2021) 10/30/2022   Urge incontinence of urine 10/30/2022   Word finding difficulty 10/30/2022   Chronic pain of left knee 08/21/2022   Lumbar radiculopathy 03/25/2022   Cough 12/27/2021   Sleep difficulties 09/27/2021   Cervicogenic headache 04/10/2021   Elevated blood pressure reading 04/10/2021   Skin abnormality 02/27/2021   Aortic atherosclerosis (HCC) 09/22/2020   Urinary urgency 03/23/2020   CKD (chronic kidney disease) stage 3, GFR 30-59 ml/min (HCC) 03/22/2020   Prediabetes 03/22/2020   Change in stool 11/17/2019   Right-sided chest wall pain 11/17/2019   Urinary retention 09/23/2019   SOB (shortness of breath) 01/15/2019   Upper airway cough syndrome 01/15/2019   Belching 12/14/2018   Reactive airway disease 12/05/2018   Tear of left rotator cuff 10/05/2018   Pre-syncope 05/19/2018   DDD (degenerative disc disease), cervical 01/15/2018   Neuralgia 09/12/2017   Presbycusis of both ears 12/11/2016   Memory difficulties 11/06/2016   IBS (irritable bowel syndrome) 04/16/2016   Hyperlipidemia 09/10/2015   Osteoporosis 09/08/2015   Palpitations    Pleural plaque without asbestos 12/03/2010   Chronic neck pain 12/18/2009   Diverticulosis of colon 03/02/2009   Lumbago 03/11/2008   Actinic keratosis 01/23/2008   Malignant  neoplasm of female breast (HCC) 10/12/2007   Hypothyroidism 06/18/2007   Allergic rhinitis 06/11/2007      REFERRING PROVIDER:  Huel Cote, MD   REFERRING DIAG:  [phone] (ICD-10-CM) - Tendinopathy of left gluteus medius    s/p Lt GMR  Rationale for Evaluation and Treatment: Rehabilitation  THERAPY DIAG:  Pain in left hip  Difficulty in walking, not elsewhere classified  Muscle weakness (generalized)  ONSET DATE: DOS 09/09/23   SUBJECTIVE:                                                                                                                                                                                            SUBJECTIVE STATEMENT: "No pain anywhere"     PERTINENT HISTORY:  Urge incontinence, chronic Lt hip & knee pain, OP, bilat occipital neuralgia  PAIN:  Are you having pain?no: NPRS scale: 0/10 Pain location: Pain description:  Aggravating factors:  Relieving factors:   PRECAUTIONS:  None  RED FLAGS: None   WEIGHT BEARING RESTRICTIONS:  Yes WBAT  FALLS:  Has patient fallen in last 6 months? No  LIVING ENVIRONMENT: 2 steps to enter home with handrail on Rt when ascending Does not need to go upstairs- bedroom is on the first floor  OCCUPATION:  retired  PLOF:  Independent  PATIENT GOALS:  Walk without pain and limping, stairs, water walking, I have a treadmill at home.    OBJECTIVE:  Note: Objective measures were completed at Evaluation unless otherwise noted.                                                                                                                              TREATMENT DATE:  [facility] Adult PT Treatment:                                                DATE: 11/11/23 Pt seen for aquatic therapy today.  Treatment took place in water 3.5-4.75 ft  in depth at the [facility] pool. Temp of water was 91.  Pt entered/exited the pool via stairs using step-to pattern with hand rail.  *Unsupported: Walking forward ;  backward ;  side stepping  * L stretch at rails of stairs  *Forward runners step up with ue support,x12 leading R/L just 2 finger support balance *hip hiking bottom step 2 x 10 *UE support wall: curtsy squats x 15-> supported by yellow HB x 6 *step downs x10 from bottom water step *3 way stretch using solid noodle  *Tandem stance ue add/abd 2 x 10 using RBHB * L stretch at wall for lower back relief throughout session  Pt requires the buoyancy and hydrostatic pressure of water for support, and to offload joints by unweighting joint load by at least 50 % in navel deep water and by at least 75-80% in chest to neck deep water.   Viscosity of the water is needed for resistance of strengthening. Water current perturbations provides challenge to standing balance requiring increased core activation.     PATIENT EDUCATION:  Education details:  aquatic therapy exercise progressions/ modifications  Person educated: Patient Education method: Explanation Education comprehension: verbalized understanding  HOME EXERCISE PROGRAM: [email or web address]   ASSESSMENT:  CLINICAL IMPRESSION: Pt is 9 weeks s/p L hip gluteus maximus tendon transfers and trochanteric bursectomy. Pt reporting pain free knee, hip and LB. She is ready to transition onto land based therapy.  She completes progressive program in aquatic today adding to resistance for strengthening.  She reports feeling right glutes engaging and getting stronger.She has reached her max potential in setting.     REHAB POTENTIAL: Good  CLINICAL DECISION MAKING: Stable/uncomplicated  EVALUATION COMPLEXITY: Low   GOALS: Goals reviewed with patient? Yes  SHORT TERM GOALS: Target date: 10/07/23 (4 wks post op)  Able to demo gait with proper pattern for houshold distances without AD Baseline: Goal status: Met - 10/23/23  2.  Hip flexion to 90 deg without pain Baseline:  Goal status:Met- 09/22/23  3.  Good quad and glut activation in CKC Baseline:  Goal status: Met 11/11/23   LONG TERM GOALS: Target date: POC Date  Perform stairs step over step without pain Baseline:  Goal status: In progress - 10/30/23  2.  MMT 90% of opposite lower extremity Baseline:  Goal status: INITIAL  3.  Ambulate community distances without pain Baseline:  Goal status: INITIAL  4.  Perform proper pelvic control in squat and SLS Baseline:  Goal status: INITIAL    PLAN:  PT FREQUENCY: 1-2x/week  PT DURATION: other: POC date  PLANNED INTERVENTIONS: 97164- PT Re-evaluation, 97110-Therapeutic exercises, 97530- Therapeutic activity, 97112- Neuromuscular re-education, 97535- Self Care,  97140- Manual therapy, [phone]- Gait training, 60454- Aquatic Therapy, Patient/Family education, Balance training, Stair training, Taping, Dry Needling, Joint mobilization, Spinal mobilization, Scar mobilization, Cryotherapy, and Moist heat.  PLAN FOR NEXT SESSION:  Continue per Dr. Serena Croissant glute med repair protocol.  Monitor L knee and ITB pain.  Pt to continue aquatic therapy.    Corrie Dandy Benson) Ibtisam Benge MPT 11/11/23 12:59 PM [facility] [address] [place], [place], [address] Phone: [phone]   Fax:  [phone]

## 2023-11-13 ENCOUNTER — Ambulatory Visit (HOSPITAL_BASED_OUTPATIENT_CLINIC_OR_DEPARTMENT_OTHER): Payer: PPO | Admitting: Physical Therapy

## 2023-11-13 ENCOUNTER — Encounter (HOSPITAL_BASED_OUTPATIENT_CLINIC_OR_DEPARTMENT_OTHER): Payer: Self-pay | Admitting: Physical Therapy

## 2023-11-13 ENCOUNTER — Ambulatory Visit (HOSPITAL_BASED_OUTPATIENT_CLINIC_OR_DEPARTMENT_OTHER): Payer: Self-pay | Admitting: Physical Therapy

## 2023-11-13 DIAGNOSIS — M25552 Pain in left hip: Secondary | ICD-10-CM | POA: Diagnosis not present

## 2023-11-13 DIAGNOSIS — M6281 Muscle weakness (generalized): Secondary | ICD-10-CM

## 2023-11-13 DIAGNOSIS — R262 Difficulty in walking, not elsewhere classified: Secondary | ICD-10-CM

## 2023-11-13 NOTE — Therapy (Signed)
 OUTPATIENT PHYSICAL THERAPY TREATMENT  Patient Name: Emily [name] MRN: [id number] DOB:05-20-1941, 83 y.o., female Today's Date: 11/14/2023  END OF SESSION:  PT End of Session - 11/13/23 1542     Visit Number 15    Number of Visits 28    Date for PT Re-Evaluation 12/20/23    Authorization Type HTA, no VL    PT Start Time 1538    PT Stop Time 1612    PT Time Calculation (min) 34 min    Activity Tolerance Patient tolerated treatment well    Behavior During Therapy WFL for tasks assessed/performed               Past Medical History:  Diagnosis Date   Actinic keratosis    Allergic rhinitis    BACK PAIN    CARCINOMA, BREAST, 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   L s/p lumpectomy, chemo and XRT   Cervicalgia    Cholelithiasis    DIVERTICULOSIS, COLON    ENDOMETRIOSIS    Heart palpitations    HYPOTHYROIDISM    Left wrist fracture 11/06/2016   10/2016 - fell of high stool   Osteoporosis    Palpitations    Personal history of chemotherapy    Personal history of radiation therapy    Pleural plaque without asbestos    CT chest 10/2010: R>L lobes, upper> lower   Renal disorder    Squamous cell carcinoma    History of BC   Past Surgical History:  Procedure Laterality Date   BREAST LUMPECTOMY  2001   left   CATARACT EXTRACTION Bilateral 09/2014   both eyes   CHOLECYSTECTOMY  2010   ENDOMETRIAL ABLATION     EXCISION / BIOPSY BREAST / NIPPLE / DUCT Left 2001   GLUTEUS MINIMUS REPAIR Left 09/09/2023   Procedure: LEFT GLUTEUS MAXIMUS TENDON TRANSFER REPAIR;  Surgeon: Huel Cote, MD;  Location: [facility];  Service: Orthopedics;  Laterality: Left;   Patient Active Problem List   Diagnosis Date Noted   RLS (restless legs syndrome) 10/31/2023   Vitamin D deficiency 10/31/2023   Tendinopathy of left gluteus medius 09/09/2023   Acute cystitis 04/16/2023   Change in bowel habits 01/15/2023   Anxiety 01/01/2023   Agatston coronary artery calcium score  22.4  (12/2021) 10/30/2022   Urge incontinence of urine 10/30/2022   Word finding difficulty 10/30/2022   Chronic pain of left knee 08/21/2022   Lumbar radiculopathy 03/25/2022   Cough 12/27/2021   Sleep difficulties 09/27/2021   Cervicogenic headache 04/10/2021   Elevated blood pressure reading 04/10/2021   Skin abnormality 02/27/2021   Aortic atherosclerosis (HCC) 09/22/2020   Urinary urgency 03/23/2020   CKD (chronic kidney disease) stage 3, GFR 30-59 ml/min (HCC) 03/22/2020   Prediabetes 03/22/2020   Change in stool 11/17/2019   Right-sided chest wall pain 11/17/2019   Urinary retention 09/23/2019   SOB (shortness of breath) 01/15/2019   Upper airway cough syndrome 01/15/2019   Belching 12/14/2018   Reactive airway disease 12/05/2018   Tear of left rotator cuff 10/05/2018   Pre-syncope 05/19/2018   DDD (degenerative disc disease), cervical 01/15/2018   Neuralgia 09/12/2017   Presbycusis of both ears 12/11/2016   Memory difficulties 11/06/2016   IBS (irritable bowel syndrome) 04/16/2016   Hyperlipidemia 09/10/2015   Osteoporosis 09/08/2015   Palpitations    Pleural plaque without asbestos 12/03/2010   Chronic neck pain 12/18/2009   Diverticulosis of colon 03/02/2009   Lumbago 03/11/2008   Actinic keratosis 01/23/2008  Malignant neoplasm of female breast (HCC) 10/12/2007   Hypothyroidism 06/18/2007   Allergic rhinitis 06/11/2007      REFERRING PROVIDER:  Huel Cote, MD   REFERRING DIAG:  [id number] (ICD-10-CM) - Tendinopathy of left gluteus medius    s/p Lt GMR  Rationale for Evaluation and Treatment: Rehabilitation  THERAPY DIAG:  Pain in left hip  Muscle weakness (generalized)  Difficulty in walking, not elsewhere classified  ONSET DATE: DOS 09/09/23   SUBJECTIVE:                                                                                                                                                                                            SUBJECTIVE STATEMENT: Pt is 9 weeks and 2 days s/p L hip gluteus maximus tendon transfers and trochanteric bursectomy.  Pt has been receiving aquatic therapy.  She states she felt great in the pool and is feeling much better.  Her ITB is doing much better.  Pt has pain and difficulty with stairs.  Pt reports improved ambulation and has not been using the cane for 2-3 weeks.  Pt denies any adverse effects after prior Rx.      PERTINENT HISTORY:  Urge incontinence, chronic Lt hip & knee pain, OP, bilat occipital neuralgia  PAIN:  Are you having pain?no: NPRS scale: 0/10 Pain location: Pain description:  Aggravating factors:  Relieving factors:   PRECAUTIONS:  None  RED FLAGS: None   WEIGHT BEARING RESTRICTIONS:  Yes WBAT  FALLS:  Has patient fallen in last 6 months? No  LIVING ENVIRONMENT: 2 steps to enter home with handrail on Rt when ascending Does not need to go upstairs- bedroom is on the first floor  OCCUPATION:  retired  PLOF:  Independent  PATIENT GOALS:  Walk without pain and limping, stairs, water walking, I have a treadmill at home.    OBJECTIVE:  Note: Objective measures were completed at Evaluation unless otherwise noted.  TREATMENT DATE:   Reviewed pt presentation, response to prior Rx, and pain level.  Seated hip abduction isometric with manual resistance and also self-resistance x 10 reps each with 5 sec hold LAQ 0#, 1#, 1.5# x 10 each Supine bridge 2x10 Standing heel raises 2x10   Reviewed HEP and answered Pt's questions.   PATIENT EDUCATION:  Education details:  exercise form, relevant anatomy, and HEP.   Person educated: Patient Education method: Explanation, demonstration, verbal cues, handout Education comprehension: verbalized understanding, returned demonstration, verbal cues required  HOME EXERCISE  PROGRAM: [id number]  Updated HEP: - Seated Isometric Hip Abduction  - 1 x daily - 5 x weekly - 2 sets - 10 reps - 5 seconds hold - Supine Bridge  - 1 x daily - 5-7 x weekly - 2 sets - 10 reps   ASSESSMENT:  CLINICAL IMPRESSION: Pt has been performing aquatic therapy and returns to PT for land based therapy today.  Pt is feeling much better including having significantly improved ITB pain.  Pt performed exercises per protocol with cuing and instruction in correct form.  Pt tolerated exercises well and had no c/o's.  PT educated pt concerning HEP and answered pt's questions.  She responded well to Rx having no c/o's and no pain after Rx.  She should benefit from skilled PT per protocol to address impairments and goals and to assist with returning to desired level of function.      REHAB POTENTIAL: Good  CLINICAL DECISION MAKING: Stable/uncomplicated  EVALUATION COMPLEXITY: Low   GOALS: Goals reviewed with patient? Yes  SHORT TERM GOALS: Target date: 10/07/23 (4 wks post op)  Able to demo gait with proper pattern for houshold distances without AD Baseline: Goal status: Met - 10/23/23  2.  Hip flexion to 90 deg without pain Baseline:  Goal status:Met- 09/22/23  3.  Good quad and glut activation in CKC Baseline:  Goal status: Met 11/11/23   LONG TERM GOALS: Target date: POC Date  Perform stairs step over step without pain Baseline:  Goal status: In progress - 10/30/23  2.  MMT 90% of opposite lower extremity Baseline:  Goal status: INITIAL  3.  Ambulate community distances without pain Baseline:  Goal status: INITIAL  4.  Perform proper pelvic control in squat and SLS Baseline:  Goal status: INITIAL    PLAN:  PT FREQUENCY: 1-2x/week  PT DURATION: other: POC date  PLANNED INTERVENTIONS: 97164- PT Re-evaluation, 97110-Therapeutic exercises, 97530- Therapeutic activity, 97112- Neuromuscular re-education, 97535- Self Care, 54098- Manual therapy, [phone]- Gait training,  [phone]- Aquatic Therapy, Patient/Family education, Balance training, Stair training, Taping, Dry Needling, Joint mobilization, Spinal mobilization, Scar mobilization, Cryotherapy, and Moist heat.  PLAN FOR NEXT SESSION:  Continue per Dr. Serena Croissant glute med repair protocol.  Monitor L knee and ITB pain.    Audie Clear III PT, DPT 11/14/23 10:34 PM

## 2023-11-18 ENCOUNTER — Ambulatory Visit (HOSPITAL_BASED_OUTPATIENT_CLINIC_OR_DEPARTMENT_OTHER): Payer: PPO | Attending: Orthopaedic Surgery

## 2023-11-18 ENCOUNTER — Encounter (HOSPITAL_BASED_OUTPATIENT_CLINIC_OR_DEPARTMENT_OTHER): Payer: Self-pay

## 2023-11-18 DIAGNOSIS — R262 Difficulty in walking, not elsewhere classified: Secondary | ICD-10-CM

## 2023-11-18 DIAGNOSIS — M25552 Pain in left hip: Secondary | ICD-10-CM | POA: Diagnosis not present

## 2023-11-18 DIAGNOSIS — M6281 Muscle weakness (generalized): Secondary | ICD-10-CM | POA: Diagnosis not present

## 2023-11-18 NOTE — Therapy (Signed)
 OUTPATIENT PHYSICAL THERAPY TREATMENT  Patient Name: Emily [name] MRN: [id number] DOB:September 06, 1940, 83 y.o., female Today's Date: 11/18/2023  END OF SESSION:  PT End of Session - 11/18/23 1058     Visit Number 16    Number of Visits 28    Date for PT Re-Evaluation 12/20/23    Authorization Type HTA, no VL    PT Start Time 1101    PT Stop Time 1145    PT Time Calculation (min) 44 min    Activity Tolerance Patient tolerated treatment well    Behavior During Therapy WFL for tasks assessed/performed                Past Medical History:  Diagnosis Date   Actinic keratosis    Allergic rhinitis    BACK PAIN    CARCINOMA, BREAST, 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   L s/p lumpectomy, chemo and XRT   Cervicalgia    Cholelithiasis    DIVERTICULOSIS, COLON    ENDOMETRIOSIS    Heart palpitations    HYPOTHYROIDISM    Left wrist fracture 11/06/2016   10/2016 - fell of high stool   Osteoporosis    Palpitations    Personal history of chemotherapy    Personal history of radiation therapy    Pleural plaque without asbestos    CT chest 10/2010: R>L lobes, upper> lower   Renal disorder    Squamous cell carcinoma    History of BC   Past Surgical History:  Procedure Laterality Date   BREAST LUMPECTOMY  2001   left   CATARACT EXTRACTION Bilateral 09/2014   both eyes   CHOLECYSTECTOMY  2010   ENDOMETRIAL ABLATION     EXCISION / BIOPSY BREAST / NIPPLE / DUCT Left 2001   GLUTEUS MINIMUS REPAIR Left 09/09/2023   Procedure: LEFT GLUTEUS MAXIMUS TENDON TRANSFER REPAIR;  Surgeon: Huel Cote, MD;  Location: [facility];  Service: Orthopedics;  Laterality: Left;   Patient Active Problem List   Diagnosis Date Noted   RLS (restless legs syndrome) 10/31/2023   Vitamin D deficiency 10/31/2023   Tendinopathy of left gluteus medius 09/09/2023   Acute cystitis 04/16/2023   Change in bowel habits 01/15/2023   Anxiety 01/01/2023   Agatston coronary artery calcium  score 22.4  (12/2021) 10/30/2022   Urge incontinence of urine 10/30/2022   Word finding difficulty 10/30/2022   Chronic pain of left knee 08/21/2022   Lumbar radiculopathy 03/25/2022   Cough 12/27/2021   Sleep difficulties 09/27/2021   Cervicogenic headache 04/10/2021   Elevated blood pressure reading 04/10/2021   Skin abnormality 02/27/2021   Aortic atherosclerosis (HCC) 09/22/2020   Urinary urgency 03/23/2020   CKD (chronic kidney disease) stage 3, GFR 30-59 ml/min (HCC) 03/22/2020   Prediabetes 03/22/2020   Change in stool 11/17/2019   Right-sided chest wall pain 11/17/2019   Urinary retention 09/23/2019   SOB (shortness of breath) 01/15/2019   Upper airway cough syndrome 01/15/2019   Belching 12/14/2018   Reactive airway disease 12/05/2018   Tear of left rotator cuff 10/05/2018   Pre-syncope 05/19/2018   DDD (degenerative disc disease), cervical 01/15/2018   Neuralgia 09/12/2017   Presbycusis of both ears 12/11/2016   Memory difficulties 11/06/2016   IBS (irritable bowel syndrome) 04/16/2016   Hyperlipidemia 09/10/2015   Osteoporosis 09/08/2015   Palpitations    Pleural plaque without asbestos 12/03/2010   Chronic neck pain 12/18/2009   Diverticulosis of colon 03/02/2009   Lumbago 03/11/2008   Actinic keratosis 01/23/2008  Malignant neoplasm of female breast (HCC) 10/12/2007   Hypothyroidism 06/18/2007   Allergic rhinitis 06/11/2007      REFERRING PROVIDER:  Huel Cote, MD   REFERRING DIAG:  [phone] (ICD-10-CM) - Tendinopathy of left gluteus medius    s/p Lt GMR  Rationale for Evaluation and Treatment: Rehabilitation  THERAPY DIAG:  Pain in left hip  Muscle weakness (generalized)  Difficulty in walking, not elsewhere classified  ONSET DATE: DOS 09/09/23   SUBJECTIVE:                                                                                                                                                                                            SUBJECTIVE STATEMENT:  Pt reported some pain after last time which she thinks may be from the resisted LAQ last session. "Can we try without the weight today?" She reports 3/10 pain level.    Pt is 9 weeks and 2 days s/p L hip gluteus maximus tendon transfers and trochanteric bursectomy.  Pt has been receiving aquatic therapy.  She states she felt great in the pool and is feeling much better.  Her ITB is doing much better.  Pt has pain and difficulty with stairs.  Pt reports improved ambulation and has not been using the cane for 2-3 weeks.  Pt denies any adverse effects after prior Rx.      PERTINENT HISTORY:  Urge incontinence, chronic Lt hip & knee pain, OP, bilat occipital neuralgia  PAIN:  Are you having pain?no: NPRS scale: 3/10 Pain location: lateral/posterior hip Pain description:  Aggravating factors:  Relieving factors:   PRECAUTIONS:  None  RED FLAGS: None   WEIGHT BEARING RESTRICTIONS:  Yes WBAT  FALLS:  Has patient fallen in last 6 months? No  LIVING ENVIRONMENT: 2 steps to enter home with handrail on Rt when ascending Does not need to go upstairs- bedroom is on the first floor  OCCUPATION:  retired  PLOF:  Independent  PATIENT GOALS:  Walk without pain and limping, stairs, water walking, I have a treadmill at home.    OBJECTIVE:  Note: Objective measures were completed at Evaluation unless otherwise noted.  TREATMENT DATE:   Reviewed pt presentation, response to prior Rx, and pain level.  Seated hip abduction isometric with manual resistance and also self-resistance x 10 reps each with 5 sec hold LAQ 0#, 1#, 1.5# x 10 each Supine bridge 2x10 Standing heel raises 2x10   Reviewed HEP and answered Pt's questions.   PATIENT EDUCATION:  Education details:  exercise form, relevant anatomy, and HEP.   Person educated:  Patient Education method: Explanation, demonstration, verbal cues, handout Education comprehension: verbalized understanding, returned demonstration, verbal cues required  HOME EXERCISE PROGRAM: [id number]  Updated HEP: - Seated Isometric Hip Abduction  - 1 x daily - 5 x weekly - 2 sets - 10 reps - 5 seconds hold - Supine Bridge  - 1 x daily - 5-7 x weekly - 2 sets - 10 reps   ASSESSMENT:  CLINICAL IMPRESSION: Performed L hip PROM with overall good tolerance. She does have significant limitation in hip ER so spent increased time on stretching into this. Continued with isometric hip abduction and reviewed proper technique for home performance. No complaints with bridging. Held resistance with LAQ with no report of increased pain. Will continue to progress as tolerated.     REHAB POTENTIAL: Good  CLINICAL DECISION MAKING: Stable/uncomplicated  EVALUATION COMPLEXITY: Low   GOALS: Goals reviewed with patient? Yes  SHORT TERM GOALS: Target date: 10/07/23 (4 wks post op)  Able to demo gait with proper pattern for houshold distances without AD Baseline: Goal status: Met - 10/23/23  2.  Hip flexion to 90 deg without pain Baseline:  Goal status:Met- 09/22/23  3.  Good quad and glut activation in CKC Baseline:  Goal status: Met 11/11/23   LONG TERM GOALS: Target date: POC Date  Perform stairs step over step without pain Baseline:  Goal status: In progress - 10/30/23  2.  MMT 90% of opposite lower extremity Baseline:  Goal status: INITIAL  3.  Ambulate community distances without pain Baseline:  Goal status: INITIAL  4.  Perform proper pelvic control in squat and SLS Baseline:  Goal status: INITIAL    PLAN:  PT FREQUENCY: 1-2x/week  PT DURATION: other: POC date  PLANNED INTERVENTIONS: 97164- PT Re-evaluation, 97110-Therapeutic exercises, 97530- Therapeutic activity, 97112- Neuromuscular re-education, 97535- Self Care, 56213- Manual therapy, [phone]- Gait training,  [phone]- Aquatic Therapy, Patient/Family education, Balance training, Stair training, Taping, Dry Needling, Joint mobilization, Spinal mobilization, Scar mobilization, Cryotherapy, and Moist heat.  PLAN FOR NEXT SESSION:  Continue per Dr. Serena Croissant glute med repair protocol.  Monitor L knee and ITB pain.    Riki Altes, PTA  11/18/23 12:26 PM

## 2023-11-19 NOTE — Therapy (Signed)
 OUTPATIENT PHYSICAL THERAPY TREATMENT  Patient Name: Emily [name] MRN: [id number] DOB:06-04-1941, 83 y.o., female Today's Date: 11/21/2023  END OF SESSION:  PT End of Session - 11/20/23 1106     Visit Number 17    Number of Visits 28    Date for PT Re-Evaluation 12/20/23    Authorization Type HTA, no VL    PT Start Time 1104    PT Stop Time 1145    PT Time Calculation (min) 41 min    Activity Tolerance Patient tolerated treatment well    Behavior During Therapy WFL for tasks assessed/performed                 Past Medical History:  Diagnosis Date   Actinic keratosis    Allergic rhinitis    BACK PAIN    CARCINOMA, BREAST, 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   L s/p lumpectomy, chemo and XRT   Cervicalgia    Cholelithiasis    DIVERTICULOSIS, COLON    ENDOMETRIOSIS    Heart palpitations    HYPOTHYROIDISM    Left wrist fracture 11/06/2016   10/2016 - fell of high stool   Osteoporosis    Palpitations    Personal history of chemotherapy    Personal history of radiation therapy    Pleural plaque without asbestos    CT chest 10/2010: R>L lobes, upper> lower   Renal disorder    Squamous cell carcinoma    History of BC   Past Surgical History:  Procedure Laterality Date   BREAST LUMPECTOMY  2001   left   CATARACT EXTRACTION Bilateral 09/2014   both eyes   CHOLECYSTECTOMY  2010   ENDOMETRIAL ABLATION     EXCISION / BIOPSY BREAST / NIPPLE / DUCT Left 2001   GLUTEUS MINIMUS REPAIR Left 09/09/2023   Procedure: LEFT GLUTEUS MAXIMUS TENDON TRANSFER REPAIR;  Surgeon: Huel Cote, MD;  Location: [facility];  Service: Orthopedics;  Laterality: Left;   Patient Active Problem List   Diagnosis Date Noted   RLS (restless legs syndrome) 10/31/2023   Vitamin D deficiency 10/31/2023   Tendinopathy of left gluteus medius 09/09/2023   Acute cystitis 04/16/2023   Change in bowel habits 01/15/2023   Anxiety 01/01/2023   Agatston coronary artery calcium  score 22.4  (12/2021) 10/30/2022   Urge incontinence of urine 10/30/2022   Word finding difficulty 10/30/2022   Chronic pain of left knee 08/21/2022   Lumbar radiculopathy 03/25/2022   Cough 12/27/2021   Sleep difficulties 09/27/2021   Cervicogenic headache 04/10/2021   Elevated blood pressure reading 04/10/2021   Skin abnormality 02/27/2021   Aortic atherosclerosis (HCC) 09/22/2020   Urinary urgency 03/23/2020   CKD (chronic kidney disease) stage 3, GFR 30-59 ml/min (HCC) 03/22/2020   Prediabetes 03/22/2020   Change in stool 11/17/2019   Right-sided chest wall pain 11/17/2019   Urinary retention 09/23/2019   SOB (shortness of breath) 01/15/2019   Upper airway cough syndrome 01/15/2019   Belching 12/14/2018   Reactive airway disease 12/05/2018   Tear of left rotator cuff 10/05/2018   Pre-syncope 05/19/2018   DDD (degenerative disc disease), cervical 01/15/2018   Neuralgia 09/12/2017   Presbycusis of both ears 12/11/2016   Memory difficulties 11/06/2016   IBS (irritable bowel syndrome) 04/16/2016   Hyperlipidemia 09/10/2015   Osteoporosis 09/08/2015   Palpitations    Pleural plaque without asbestos 12/03/2010   Chronic neck pain 12/18/2009   Diverticulosis of colon 03/02/2009   Lumbago 03/11/2008   Actinic keratosis 01/23/2008  Malignant neoplasm of female breast (HCC) 10/12/2007   Hypothyroidism 06/18/2007   Allergic rhinitis 06/11/2007      REFERRING PROVIDER:  Huel Cote, MD   REFERRING DIAG:  [id number] (ICD-10-CM) - Tendinopathy of left gluteus medius    s/p Lt GMR  Rationale for Evaluation and Treatment: Rehabilitation  THERAPY DIAG:  Pain in left hip  Muscle weakness (generalized)  Difficulty in walking, not elsewhere classified  ONSET DATE: DOS 09/09/23   SUBJECTIVE:                                                                                                                                                                                            SUBJECTIVE STATEMENT:  Pt is 10 weeks and 2 days s/p L hip gluteus maximus tendon transfers and trochanteric bursectomy.  Pt denies any adverse effects after prior Rx.  Pt denies pain currently.  Pt states she has been performing supine bridge and hip abd isometric at home without any adverse effects.  Pt reports improved tolerance with household chores.       PERTINENT HISTORY:  Urge incontinence, chronic Lt hip & knee pain, OP, bilat occipital neuralgia  PAIN:  Are you having pain?NPRS scale: 3/10 Pain location: lateral/posterior hip Pain description:  Aggravating factors:  Relieving factors:   PRECAUTIONS:  None  RED FLAGS: None   WEIGHT BEARING RESTRICTIONS:  Yes WBAT  FALLS:  Has patient fallen in last 6 months? No  LIVING ENVIRONMENT: 2 steps to enter home with handrail on Rt when ascending Does not need to go upstairs- bedroom is on the first floor  OCCUPATION:  retired  PLOF:  Independent  PATIENT GOALS:  Walk without pain and limping, stairs, water walking, I have a treadmill at home.    OBJECTIVE:  Note: Objective measures were completed at Evaluation unless otherwise noted.                                                                                                                              TREATMENT DATE:   Reviewed HEP compliance , response to  prior Rx, and pain level.  Pt received L hip PROM in flexion, abd, ER, and IR in supine per pt and tissue tolerance w/n protocol ranges.   Seated hip abduction isometric with manual resistance and also self-resistance x 10 reps each with 5 sec hold Supine bridge 2x10 Supine hip abd 2x10 Stool rotations x10 Standing heel raises 2x10 Standing marching 2x10    PATIENT EDUCATION:  Education details:  exercise form, relevant anatomy, and HEP.   Person educated: Patient Education method: Explanation, demonstration, verbal cues, handout Education comprehension: verbalized understanding,  returned demonstration, verbal cues required  HOME EXERCISE PROGRAM: [id number]  Updated HEP: - Seated Isometric Hip Abduction  - 1 x daily - 5 x weekly - 2 sets - 10 reps - 5 seconds hold - Supine Bridge  - 1 x daily - 5-7 x weekly - 2 sets - 10 reps   ASSESSMENT:  CLINICAL IMPRESSION: Pt is improving with tolerance to activity as evidenced by reports of improved performance of household chores.  Pt tolerated hip PROM well.  PT is slowly progressing land based exercises.  Pt performed supine hip abduction well without c/o's.  Pt tends to sway forward and arch her back with standing heel raises.  PT provided verbal and tactile cuing to improve form with standing heel raises.  Pt responded well to Rx reporting improved pain to 2/10 pain after Rx.  She should benefit from cont skilled PT per protocol to address impairments and goals and to assist with restoring desired level of function.     REHAB POTENTIAL: Good  CLINICAL DECISION MAKING: Stable/uncomplicated  EVALUATION COMPLEXITY: Low   GOALS: Goals reviewed with patient? Yes  SHORT TERM GOALS: Target date: 10/07/23 (4 wks post op)  Able to demo gait with proper pattern for houshold distances without AD Baseline: Goal status: Met - 10/23/23  2.  Hip flexion to 90 deg without pain Baseline:  Goal status:Met- 09/22/23  3.  Good quad and glut activation in CKC Baseline:  Goal status: Met 11/11/23   LONG TERM GOALS: Target date: POC Date  Perform stairs step over step without pain Baseline:  Goal status: In progress - 10/30/23  2.  MMT 90% of opposite lower extremity Baseline:  Goal status: INITIAL  3.  Ambulate community distances without pain Baseline:  Goal status: INITIAL  4.  Perform proper pelvic control in squat and SLS Baseline:  Goal status: INITIAL    PLAN:  PT FREQUENCY: 1-2x/week  PT DURATION: other: POC date  PLANNED INTERVENTIONS: 97164- PT Re-evaluation, 97110-Therapeutic exercises, 97530-  Therapeutic activity, 97112- Neuromuscular re-education, 97535- Self Care, 64332- Manual therapy, [phone]- Gait training, [phone]- Aquatic Therapy, Patient/Family education, Balance training, Stair training, Taping, Dry Needling, Joint mobilization, Spinal mobilization, Scar mobilization, Cryotherapy, and Moist heat.  PLAN FOR NEXT SESSION:  Continue per Dr. Serena Croissant glute med repair protocol.  Monitor L knee and ITB pain.    Audie Clear III PT, DPT 11/21/23 7:43 AM

## 2023-11-20 ENCOUNTER — Ambulatory Visit (HOSPITAL_BASED_OUTPATIENT_CLINIC_OR_DEPARTMENT_OTHER): Payer: PPO | Admitting: Physical Therapy

## 2023-11-20 ENCOUNTER — Encounter (HOSPITAL_BASED_OUTPATIENT_CLINIC_OR_DEPARTMENT_OTHER): Payer: Self-pay | Admitting: Physical Therapy

## 2023-11-20 DIAGNOSIS — M25552 Pain in left hip: Secondary | ICD-10-CM

## 2023-11-20 DIAGNOSIS — M6281 Muscle weakness (generalized): Secondary | ICD-10-CM

## 2023-11-20 DIAGNOSIS — R262 Difficulty in walking, not elsewhere classified: Secondary | ICD-10-CM

## 2023-11-25 ENCOUNTER — Ambulatory Visit (HOSPITAL_BASED_OUTPATIENT_CLINIC_OR_DEPARTMENT_OTHER): Payer: PPO | Admitting: Physical Therapy

## 2023-11-25 DIAGNOSIS — M25552 Pain in left hip: Secondary | ICD-10-CM

## 2023-11-25 DIAGNOSIS — M6281 Muscle weakness (generalized): Secondary | ICD-10-CM

## 2023-11-25 DIAGNOSIS — R262 Difficulty in walking, not elsewhere classified: Secondary | ICD-10-CM

## 2023-11-25 NOTE — Therapy (Signed)
 OUTPATIENT PHYSICAL THERAPY TREATMENT  Patient Name: Emily [name] MRN: [id number] DOB:[birth year], 83 y.o., female Today's Date: 11/26/2023  END OF SESSION:  PT End of Session - 11/25/23 1200     Visit Number 18    Number of Visits 28    Date for PT Re-Evaluation 12/20/23    Authorization Type HTA, no VL    PT Start Time 1110    PT Stop Time 1152    PT Time Calculation (min) 42 min    Activity Tolerance Patient tolerated treatment well    Behavior During Therapy WFL for tasks assessed/performed                  Past Medical History:  Diagnosis Date   Actinic keratosis    Allergic rhinitis    BACK PAIN    CARCINOMA, BREAST, 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   L s/p lumpectomy, chemo and XRT   Cervicalgia    Cholelithiasis    DIVERTICULOSIS, COLON    ENDOMETRIOSIS    Heart palpitations    HYPOTHYROIDISM    Left wrist fracture 11/06/2016   10/2016 - fell of high stool   Osteoporosis    Palpitations    Personal history of chemotherapy    Personal history of radiation therapy    Pleural plaque without asbestos    CT chest 10/2010: R>L lobes, upper> lower   Renal disorder    Squamous cell carcinoma    History of BC   Past Surgical History:  Procedure Laterality Date   BREAST LUMPECTOMY  2001   left   CATARACT EXTRACTION Bilateral 09/2014   both eyes   CHOLECYSTECTOMY  2010   ENDOMETRIAL ABLATION     EXCISION / BIOPSY BREAST / NIPPLE / DUCT Left 2001   GLUTEUS MINIMUS REPAIR Left 09/09/2023   Procedure: LEFT GLUTEUS MAXIMUS TENDON TRANSFER REPAIR;  Surgeon: Huel Cote, MD;  Location: [facility];  Service: Orthopedics;  Laterality: Left;   Patient Active Problem List   Diagnosis Date Noted   RLS (restless legs syndrome) 10/31/2023   Vitamin D deficiency 10/31/2023   Tendinopathy of left gluteus medius 09/09/2023   Acute cystitis 04/16/2023   Change in bowel habits 01/15/2023   Anxiety 01/01/2023   Agatston coronary artery calcium  score 22.4  (12/2021) 10/30/2022   Urge incontinence of urine 10/30/2022   Word finding difficulty 10/30/2022   Chronic pain of left knee 08/21/2022   Lumbar radiculopathy 03/25/2022   Cough 12/27/2021   Sleep difficulties 09/27/2021   Cervicogenic headache 04/10/2021   Elevated blood pressure reading 04/10/2021   Skin abnormality 02/27/2021   Aortic atherosclerosis (HCC) 09/22/2020   Urinary urgency 03/23/2020   CKD (chronic kidney disease) stage 3, GFR 30-59 ml/min (HCC) 03/22/2020   Prediabetes 03/22/2020   Change in stool 11/17/2019   Right-sided chest wall pain 11/17/2019   Urinary retention 09/23/2019   SOB (shortness of breath) 01/15/2019   Upper airway cough syndrome 01/15/2019   Belching 12/14/2018   Reactive airway disease 12/05/2018   Tear of left rotator cuff 10/05/2018   Pre-syncope 05/19/2018   DDD (degenerative disc disease), cervical 01/15/2018   Neuralgia 09/12/2017   Presbycusis of both ears 12/11/2016   Memory difficulties 11/06/2016   IBS (irritable bowel syndrome) 04/16/2016   Hyperlipidemia 09/10/2015   Osteoporosis 09/08/2015   Palpitations    Pleural plaque without asbestos 12/03/2010   Chronic neck pain 12/18/2009   Diverticulosis of colon 03/02/2009   Lumbago 03/11/2008   Actinic keratosis  01/23/2008   Malignant neoplasm of female breast (HCC) 10/12/2007   Hypothyroidism 06/18/2007   Allergic rhinitis 06/11/2007      REFERRING PROVIDER:  Huel Cote, MD   REFERRING DIAG:  [phone] (ICD-10-CM) - Tendinopathy of left gluteus medius    s/p Lt GMR  Rationale for Evaluation and Treatment: Rehabilitation  THERAPY DIAG:  Pain in left hip  Muscle weakness (generalized)  Difficulty in walking, not elsewhere classified  ONSET DATE: DOS 09/09/23   SUBJECTIVE:                                                                                                                                                                                            SUBJECTIVE STATEMENT:  Pt is 11 weeks s/p L hip gluteus maximus tendon transfers and trochanteric bursectomy.  Pt denies any adverse effects after prior Rx.  Pt denies pain currently.  Pt has been compliant with HEP.  Pt states she felt good after prior Rx.  "I'm doing good."  "Sometimes, I feel that pain in my hip".  Pt states her walking is better.  Pt states the stretching has helped her leg.     PERTINENT HISTORY:  Urge incontinence, chronic Lt hip & knee pain, OP, bilat occipital neuralgia  PAIN:  Are you having pain?NPRS scale: 0/10 Pain location: lateral/posterior hip Pain description:  Aggravating factors:  Relieving factors:   PRECAUTIONS:  None  RED FLAGS: None   WEIGHT BEARING RESTRICTIONS:  Yes WBAT  FALLS:  Has patient fallen in last 6 months? No  LIVING ENVIRONMENT: 2 steps to enter home with handrail on Rt when ascending Does not need to go upstairs- bedroom is on the first floor  OCCUPATION:  retired  PLOF:  Independent  PATIENT GOALS:  Walk without pain and limping, stairs, water walking, I have a treadmill at home.    OBJECTIVE:  Note: Objective measures were completed at Evaluation unless otherwise noted.  TREATMENT DATE:   Reviewed HEP compliance , response to prior Rx, and pain level.  Pt received L hip PROM in flexion, abd, ER, and IR in supine per pt and tissue tolerance w/n protocol ranges.   Supine bridge 2x10 Supine hip abd 2x10 Standing marching 2x10 Standing hip abd 2x10 L LE Mini squats 2x10 with bilat UE assist on rail Step ups on 4 inch step 2x10 Stool rotations 2x10  Pt received a HEP handout and was educated in correct form and appropriate frequency.  PT instructed pt in appropriate ROM and she should not have pain with exercises.    PATIENT EDUCATION:  Education details:  exercise form,  relevant anatomy, and HEP.   Person educated: Patient Education method: Explanation, demonstration, verbal cues, handout Education comprehension: verbalized understanding, returned demonstration, verbal cues required  HOME EXERCISE PROGRAM: [id number]  Updated HEP: - Supine Hip Abduction AROM  - 1 x daily - 7 x weekly - 2 sets - 10 reps - Standing March with Counter Support  - 1 x daily - 5-7 x weekly - 2 sets - 10 reps   ASSESSMENT:  CLINICAL IMPRESSION: Pt is progressing well and presents to Rx without pain today.  Pt tolerated hip PROM well.  She has been performing her HEP and tolerating it well.  PT progressed land based exercises per protocol and she tolerated progression well.  PT provided cuing and instruction for correct form and positioning with exercises per protocol and pt performed exercises well.  She responded well to Rx denying any hip pain and reported feeling sore in lower lumbar spine and sacrum after Rx.  PT updated and HEP and gave pt a HEP handout.  She demonstrates good understanding of HEP.  She should benefit from cont skilled PT per protocol to address impairments and goals and to assist with restoring desired level of function.       REHAB POTENTIAL: Good  CLINICAL DECISION MAKING: Stable/uncomplicated  EVALUATION COMPLEXITY: Low   GOALS: Goals reviewed with patient? Yes  SHORT TERM GOALS: Target date: 10/07/23 (4 wks post op)  Able to demo gait with proper pattern for houshold distances without AD Baseline: Goal status: Met - 10/23/23  2.  Hip flexion to 90 deg without pain Baseline:  Goal status:Met- 09/22/23  3.  Good quad and glut activation in CKC Baseline:  Goal status: Met 11/11/23   LONG TERM GOALS: Target date: POC Date  Perform stairs step over step without pain Baseline:  Goal status: In progress - 10/30/23  2.  MMT 90% of opposite lower extremity Baseline:  Goal status: INITIAL  3.  Ambulate community distances without  pain Baseline:  Goal status: INITIAL  4.  Perform proper pelvic control in squat and SLS Baseline:  Goal status: INITIAL    PLAN:  PT FREQUENCY: 1-2x/week  PT DURATION: other: POC date  PLANNED INTERVENTIONS: 97164- PT Re-evaluation, 97110-Therapeutic exercises, 97530- Therapeutic activity, 97112- Neuromuscular re-education, 97535- Self Care, 06301- Manual therapy, [phone]- Gait training, [phone]- Aquatic Therapy, Patient/Family education, Balance training, Stair training, Taping, Dry Needling, Joint mobilization, Spinal mobilization, Scar mobilization, Cryotherapy, and Moist heat.  PLAN FOR NEXT SESSION:  Continue per Dr. Serena Croissant glute med repair protocol.  Monitor L knee and ITB pain.    Audie Clear III PT, DPT 11/26/23 9:48 AM

## 2023-11-26 ENCOUNTER — Encounter (HOSPITAL_BASED_OUTPATIENT_CLINIC_OR_DEPARTMENT_OTHER): Payer: Self-pay | Admitting: Physical Therapy

## 2023-11-26 NOTE — Therapy (Signed)
 OUTPATIENT PHYSICAL THERAPY TREATMENT  Patient Name: Emily [name] MRN: [id number] DOB:1941-01-14, 83 y.o., female Today's Date: 11/26/2023  END OF SESSION:         Past Medical History:  Diagnosis Date   Actinic keratosis    Allergic rhinitis    BACK PAIN    CARCINOMA, BREAST, 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   L s/p lumpectomy, chemo and XRT   Cervicalgia    Cholelithiasis    DIVERTICULOSIS, COLON    ENDOMETRIOSIS    Heart palpitations    HYPOTHYROIDISM    Left wrist fracture 11/06/2016   10/2016 - fell of high stool   Osteoporosis    Palpitations    Personal history of chemotherapy    Personal history of radiation therapy    Pleural plaque without asbestos    CT chest 10/2010: R>L lobes, upper> lower   Renal disorder    Squamous cell carcinoma    History of BC   Past Surgical History:  Procedure Laterality Date   BREAST LUMPECTOMY  2001   left   CATARACT EXTRACTION Bilateral 09/2014   both eyes   CHOLECYSTECTOMY  2010   ENDOMETRIAL ABLATION     EXCISION / BIOPSY BREAST / NIPPLE / DUCT Left 2001   GLUTEUS MINIMUS REPAIR Left 09/09/2023   Procedure: LEFT GLUTEUS MAXIMUS TENDON TRANSFER REPAIR;  Surgeon: Huel Cote, MD;  Location: [facility];  Service: Orthopedics;  Laterality: Left;   Patient Active Problem List   Diagnosis Date Noted   RLS (restless legs syndrome) 10/31/2023   Vitamin D deficiency 10/31/2023   Tendinopathy of left gluteus medius 09/09/2023   Acute cystitis 04/16/2023   Change in bowel habits 01/15/2023   Anxiety 01/01/2023   Agatston coronary artery calcium score 22.4  (12/2021) 10/30/2022   Urge incontinence of urine 10/30/2022   Word finding difficulty 10/30/2022   Chronic pain of left knee 08/21/2022   Lumbar radiculopathy 03/25/2022   Cough 12/27/2021   Sleep difficulties 09/27/2021   Cervicogenic headache 04/10/2021   Elevated blood pressure reading 04/10/2021   Skin abnormality 02/27/2021   Aortic  atherosclerosis (HCC) 09/22/2020   Urinary urgency 03/23/2020   CKD (chronic kidney disease) stage 3, GFR 30-59 ml/min (HCC) 03/22/2020   Prediabetes 03/22/2020   Change in stool 11/17/2019   Right-sided chest wall pain 11/17/2019   Urinary retention 09/23/2019   SOB (shortness of breath) 01/15/2019   Upper airway cough syndrome 01/15/2019   Belching 12/14/2018   Reactive airway disease 12/05/2018   Tear of left rotator cuff 10/05/2018   Pre-syncope 05/19/2018   DDD (degenerative disc disease), cervical 01/15/2018   Neuralgia 09/12/2017   Presbycusis of both ears 12/11/2016   Memory difficulties 11/06/2016   IBS (irritable bowel syndrome) 04/16/2016   Hyperlipidemia 09/10/2015   Osteoporosis 09/08/2015   Palpitations    Pleural plaque without asbestos 12/03/2010   Chronic neck pain 12/18/2009   Diverticulosis of colon 03/02/2009   Lumbago 03/11/2008   Actinic keratosis 01/23/2008   Malignant neoplasm of female breast (HCC) 10/12/2007   Hypothyroidism 06/18/2007   Allergic rhinitis 06/11/2007      REFERRING PROVIDER:  Huel Cote, MD   REFERRING DIAG:  [phone] (ICD-10-CM) - Tendinopathy of left gluteus medius    s/p Lt GMR  Rationale for Evaluation and Treatment: Rehabilitation  THERAPY DIAG:  No diagnosis found.  ONSET DATE: DOS 09/09/23   SUBJECTIVE:  SUBJECTIVE STATEMENT:  Pt is 11 weeks and 2 days s/p L hip gluteus maximus tendon transfers and trochanteric bursectomy.  Pt reports she had increased L hip pain after prior Rx.  Pt states she began having groin pain this AM.  Pt states she could not complete the 2nd set of her home exercises yesterday.       PERTINENT HISTORY:  Urge incontinence, chronic Lt hip & knee pain, OP, bilat occipital neuralgia  PAIN:  Are you  having pain?  NPRS scale: 3/10 Pain location:  groin and posterior hip > lateral/posterior hip Pain description:  Aggravating factors:  Relieving factors:   PRECAUTIONS:  None  RED FLAGS: None   WEIGHT BEARING RESTRICTIONS:  Yes WBAT  FALLS:  Has patient fallen in last 6 months? No  LIVING ENVIRONMENT: 2 steps to enter home with handrail on Rt when ascending Does not need to go upstairs- bedroom is on the first floor  OCCUPATION:  retired  PLOF:  Independent  PATIENT GOALS:  Walk without pain and limping, stairs, water walking, I have a treadmill at home.    OBJECTIVE:  Note: Objective measures were completed at Evaluation unless otherwise noted.                                                                                                                              TREATMENT DATE:   Reviewed HEP compliance , response to prior Rx, and pain level.  Pt received L hip PROM in flexion, abd, ER, and IR in supine per pt and tissue tolerance w/n protocol ranges.   Standing marching 2x10 Supine hip abd 2x10 Standing hip abd x 6, 10 reps L LE Mini squats 2x10 with bilat UE assist on counter Step ups on 4 inch step 2x10    PATIENT EDUCATION:  Education details:  exercise form, relevant anatomy, and HEP.  PT instructed pt to not perform supine hip abduction at home.  Person educated: Patient Education method: Explanation, demonstration, verbal cues, handout Education comprehension: verbalized understanding, returned demonstration, verbal cues required  HOME EXERCISE PROGRAM: [id number]     ASSESSMENT:  CLINICAL IMPRESSION: Pt is progressing well and presents to Rx without pain today.  Pt tolerated hip PROM well.  She has been performing her HEP and tolerating it well.  PT progressed land based exercises per protocol and she tolerated progression well.  PT provided cuing and instruction for correct form and positioning with exercises per protocol and pt  performed exercises well.  She responded well to Rx denying any hip pain and reported feeling sore in lower lumbar spine and sacrum after Rx.  PT updated and HEP and gave pt a HEP handout.  She demonstrates good understanding of HEP.  She should benefit from cont skilled PT per protocol to address impairments and goals and to assist with restoring desired level of function.   Pt could feel it in her hip on the 2nd set of supine  hip abd though states it's not painful.  Pt states her hip/leg feels better after Rx.     REHAB POTENTIAL: Good  CLINICAL DECISION MAKING: Stable/uncomplicated  EVALUATION COMPLEXITY: Low   GOALS: Goals reviewed with patient? Yes  SHORT TERM GOALS: Target date: 10/07/23 (4 wks post op)  Able to demo gait with proper pattern for houshold distances without AD Baseline: Goal status: Met - 10/23/23  2.  Hip flexion to 90 deg without pain Baseline:  Goal status:Met- 09/22/23  3.  Good quad and glut activation in CKC Baseline:  Goal status: Met 11/11/23   LONG TERM GOALS: Target date: POC Date  Perform stairs step over step without pain Baseline:  Goal status: In progress - 10/30/23  2.  MMT 90% of opposite lower extremity Baseline:  Goal status: INITIAL  3.  Ambulate community distances without pain Baseline:  Goal status: INITIAL  4.  Perform proper pelvic control in squat and SLS Baseline:  Goal status: INITIAL    PLAN:  PT FREQUENCY: 1-2x/week  PT DURATION: other: POC date  PLANNED INTERVENTIONS: 97164- PT Re-evaluation, 97110-Therapeutic exercises, 97530- Therapeutic activity, 97112- Neuromuscular re-education, 97535- Self Care, 81191- Manual therapy, [phone]- Gait training, [phone]- Aquatic Therapy, Patient/Family education, Balance training, Stair training, Taping, Dry Needling, Joint mobilization, Spinal mobilization, Scar mobilization, Cryotherapy, and Moist heat.  PLAN FOR NEXT SESSION:  Continue per Dr. Serena Croissant glute med repair  protocol.  Monitor L knee and ITB pain.  Pt is going to [place].   Audie Clear III PT, DPT 11/26/23 10:07 PM

## 2023-11-27 ENCOUNTER — Encounter (HOSPITAL_BASED_OUTPATIENT_CLINIC_OR_DEPARTMENT_OTHER): Payer: Self-pay | Admitting: Physical Therapy

## 2023-11-27 ENCOUNTER — Ambulatory Visit (HOSPITAL_BASED_OUTPATIENT_CLINIC_OR_DEPARTMENT_OTHER): Payer: PPO | Admitting: Physical Therapy

## 2023-11-27 ENCOUNTER — Encounter: Payer: Self-pay | Admitting: Internal Medicine

## 2023-11-27 DIAGNOSIS — M6281 Muscle weakness (generalized): Secondary | ICD-10-CM

## 2023-11-27 DIAGNOSIS — M25552 Pain in left hip: Secondary | ICD-10-CM

## 2023-11-27 DIAGNOSIS — R262 Difficulty in walking, not elsewhere classified: Secondary | ICD-10-CM

## 2023-11-27 NOTE — Patient Instructions (Addendum)
 Your next Prolia is due in June-someone should call you to schedule this and if you do not hear from [place] please call us.   Medications changes include :   None     Return in about 1 year (around 11/27/2024) for Physical Exam.   Health Maintenance, Female Adopting a healthy lifestyle and getting preventive care are important in promoting health and wellness. Ask your health care provider about: The right schedule for you to have regular tests and exams. Things you can do on your own to prevent diseases and keep yourself healthy. What should I know about diet, weight, and exercise? Eat a healthy diet  Eat a diet that includes plenty of vegetables, fruits, low-fat dairy products, and lean protein. Do not eat a lot of foods that are high in solid fats, added sugars, or sodium. Maintain a healthy weight Body mass index (BMI) is used to identify weight problems. It estimates body fat based on height and weight. Your health care provider can help determine your BMI and help you achieve or maintain a healthy weight. Get regular exercise Get regular exercise. This is one of the most important things you can do for your health. Most adults should: Exercise for at least 150 minutes each week. The exercise should increase your heart rate and make you sweat (moderate-intensity exercise). Do strengthening exercises at least twice a week. This is in addition to the moderate-intensity exercise. Spend less time sitting. Even light physical activity can be beneficial. Watch cholesterol and blood lipids Have your blood tested for lipids and cholesterol at 82 years of age, then have this test every 5 years. Have your cholesterol levels checked more often if: Your lipid or cholesterol levels are high. You are older than 82 years of age. You are at high risk for heart disease. What should I know about cancer screening? Depending on your health history and family history, you may need to have cancer  screening at various ages. This may include screening for: Breast cancer. Cervical cancer. Colorectal cancer. Skin cancer. Lung cancer. What should I know about heart disease, diabetes, and high blood pressure? Blood pressure and heart disease High blood pressure causes heart disease and increases the risk of stroke. This is more likely to develop in people who have high blood pressure readings or are overweight. Have your blood pressure checked: Every 3-5 years if you are 22-37 years of age. Every year if you are 33 years old or older. Diabetes Have regular diabetes screenings. This checks your fasting blood sugar level. Have the screening done: Once every three years after age 55 if you are at a normal weight and have a low risk for diabetes. More often and at a younger age if you are overweight or have a high risk for diabetes. What should I know about preventing infection? Hepatitis B If you have a higher risk for hepatitis B, you should be screened for this virus. Talk with your health care provider to find out if you are at risk for hepatitis B infection. Hepatitis C Testing is recommended for: Everyone born from [birth year] through 1964. Anyone with known risk factors for hepatitis C. Sexually transmitted infections (STIs) Get screened for STIs, including gonorrhea and chlamydia, if: You are sexually active and are younger than 82 years of age. You are older than 82 years of age and your health care provider tells you that you are at risk for this type of infection. Your sexual activity has changed since  you were last screened, and you are at increased risk for chlamydia or gonorrhea. Ask your health care provider if you are at risk. Ask your health care provider about whether you are at high risk for HIV. Your health care provider may recommend a prescription medicine to help prevent HIV infection. If you choose to take medicine to prevent HIV, you should first get tested for HIV. You  should then be tested every 3 months for as long as you are taking the medicine. Pregnancy If you are about to stop having your period (premenopausal) and you may become pregnant, seek counseling before you get pregnant. Take 400 to 800 micrograms (mcg) of folic acid every day if you become pregnant. Ask for birth control (contraception) if you want to prevent pregnancy. Osteoporosis and menopause Osteoporosis is a disease in which the bones lose minerals and strength with aging. This can result in bone fractures. If you are 21 years old or older, or if you are at risk for osteoporosis and fractures, ask your health care provider if you should: Be screened for bone loss. Take a calcium or vitamin D supplement to lower your risk of fractures. Be given hormone replacement therapy (HRT) to treat symptoms of menopause. Follow these instructions at home: Alcohol use Do not drink alcohol if: Your health care provider tells you not to drink. You are pregnant, may be pregnant, or are planning to become pregnant. If you drink alcohol: Limit how much you have to: 0-1 drink a day. Know how much alcohol is in your drink. In the [place] one drink equals one 12 oz bottle of beer (355 mL), one 5 oz glass of wine (148 mL), or one 1 oz glass of hard liquor (44 mL). Lifestyle Do not use any products that contain nicotine or tobacco. These products include cigarettes, chewing tobacco, and vaping devices, such as e-cigarettes. If you need help quitting, ask your health care provider. Do not use street drugs. Do not share needles. Ask your health care provider for help if you need support or information about quitting drugs. General instructions Schedule regular health, dental, and eye exams. Stay current with your vaccines. Tell your health care provider if: You often feel depressed. You have ever been abused or do not feel safe at home. Summary Adopting a healthy lifestyle and getting preventive care are  important in promoting health and wellness. Follow your health care provider's instructions about healthy diet, exercising, and getting tested or screened for diseases. Follow your health care provider's instructions on monitoring your cholesterol and blood pressure. This information is not intended to replace advice given to you by your health care provider. Make sure you discuss any questions you have with your health care provider. Document Revised: 12/25/2020 Document Reviewed: 12/25/2020 Elsevier Patient Education  2024 [employer].

## 2023-11-27 NOTE — Progress Notes (Unsigned)
 Subjective:    Patient ID: Emily [name], female    DOB: 1940/09/09, 83 y.o.   MRN: [id number]      HPI Emily [name] is here for a Physical exam and her chronic medical problems.    Had labs done-LDL much higher  Still going to PT on land - she preferred water PT.  No pain with day to day activities.    Headaches controlled with gabapentin.    Feeling tired  - ? Related to exercise.  Feels tired when she wakes up and even after her shower.    Medications and allergies reviewed with patient and updated if appropriate.  Current Outpatient Medications on File Prior to Visit  Medication Sig Dispense Refill   atenolol (TENORMIN) 25 MG tablet TAKE 1 TABLET BY MOUTH EVERY DAY 90 tablet 2   Calcium Carb-Cholecalciferol (CALCIUM 600 + D PO) Take by mouth.     chlorpheniramine (CHLOR-TRIMETON) 4 MG tablet Take 1 tablet (4 mg total) by mouth 2 (two) times daily as needed for allergies.     cholecalciferol (VITAMIN D) 25 MCG (1000 UNIT) tablet Take 1,000 Units by mouth daily.     EPINEPHrine 0.3 mg/0.3 mL IJ SOAJ injection Inject 0.3 mg into the muscle as needed for anaphylaxis. 1 each 0   gabapentin (NEURONTIN) 100 MG capsule TAKE 1 CAPSULE (100 MG TOTAL) BY MOUTH AT BEDTIME. TAKE 1 TO 3 CAPSULES AT BEDTIME 30 capsule 3   levothyroxine (SYNTHROID) 75 MCG tablet TAKE 1 TABLET BY MOUTH EVERY DAY 6 DAYS A WEEK AND 1.5 TABLET PO ONE DAY A WEEK 96 tablet 3   Current Facility-Administered Medications on File Prior to Visit  Medication Dose Route Frequency Provider Last Rate Last Admin   [START ON 02/02/2024] denosumab (PROLIA) injection 60 mg  60 mg Subcutaneous Once Pincus Sanes, MD        Review of Systems  Constitutional:  Positive for fatigue. Negative for fever.  Eyes:  Negative for visual disturbance.  Respiratory:  Negative for cough, shortness of breath and wheezing.   Cardiovascular:  Negative for chest pain, palpitations and leg swelling.  Gastrointestinal:  Negative for  abdominal pain, blood in stool, constipation and diarrhea.       No gerd  Genitourinary:  Negative for dysuria.  Musculoskeletal:  Positive for arthralgias. Negative for back pain.       Left IT band, hand OA  Skin:  Negative for rash.  Neurological:  Positive for headaches (related to neck - controlled). Negative for light-headedness and numbness.  Psychiatric/Behavioral:  Negative for dysphoric mood. The patient is not nervous/anxious.        Objective:   Vitals:   11/28/23 0913  BP: 120/80  Pulse: 78  Temp: 98 F (36.7 C)  SpO2: 97%   Filed Weights   11/28/23 0913  Weight: 123 lb (55.8 kg)   Body mass index is 24.02 kg/m.  BP Readings from Last 3 Encounters:  11/28/23 120/80  10/31/23 122/70  09/15/23 118/72    Wt Readings from Last 3 Encounters:  11/28/23 123 lb (55.8 kg)  10/31/23 123 lb (55.8 kg)  09/15/23 125 lb (56.7 kg)       Physical Exam Constitutional: She appears well-developed and well-nourished. No distress.  HENT:  Head: Normocephalic and atraumatic.  Right Ear: External ear normal. Normal ear canal and TM Left Ear: External ear normal.  Normal ear canal and TM Mouth/Throat: Oropharynx is clear and moist.  Eyes: Conjunctivae normal.  Neck: Neck supple. No tracheal deviation present. No thyromegaly present.  No carotid bruit  Cardiovascular: Normal rate, regular rhythm and normal heart sounds.   No murmur heard.  No edema. Pulmonary/Chest: Effort normal and breath sounds normal. No respiratory distress. She has no wheezes. She has no rales.  Breast: deferred   Abdominal: Soft. She exhibits no distension. There is no tenderness.  Lymphadenopathy: She has no cervical adenopathy.  Skin: Skin is warm and dry. She is not diaphoretic.  Psychiatric: She has a normal mood and affect. Her behavior is normal.     Lab Results  Component Value Date   WBC 11.4 (H) 10/31/2023   HGB 15.0 10/31/2023   HCT 46.1 (H) 10/31/2023   PLT 239.0 10/31/2023    GLUCOSE 91 10/31/2023   CHOL 244 (H) 10/31/2023   TRIG 100.0 10/31/2023   HDL 63.70 10/31/2023   LDLDIRECT 146.0 10/28/2022   LDLCALC 161 (H) 10/31/2023   ALT 15 10/31/2023   AST 21 10/31/2023   NA 139 10/31/2023   K 4.4 10/31/2023   CL 104 10/31/2023   CREATININE 0.94 10/31/2023   BUN 25 (H) 10/31/2023   CO2 27 10/31/2023   TSH 3.55 10/31/2023   HGBA1C 6.1 10/31/2023         Assessment & Plan:   Physical exam: Screening blood work  ordered Exercise  PT  Weight  normal    Substance abuse  none   Reviewed recommended immunizations.   Health Maintenance  Topic Date Due   Medicare Annual Wellness (AWV)  02/06/2024   DEXA SCAN  03/15/2024   INFLUENZA VACCINE  03/19/2024   DTaP/Tdap/Td (3 - Td or Tdap) 07/07/2027   Pneumonia Vaccine 23+ Years old  Completed   Zoster Vaccines- Shingrix  Completed   HPV VACCINES  Aged Out   Meningococcal B Vaccine  Aged Out   COVID-19 Vaccine  Discontinued   Hepatitis C Screening  Discontinued          See Problem List for Assessment and Plan of chronic medical problems.

## 2023-11-28 ENCOUNTER — Encounter (HOSPITAL_BASED_OUTPATIENT_CLINIC_OR_DEPARTMENT_OTHER): Payer: Self-pay | Admitting: Physical Therapy

## 2023-11-28 ENCOUNTER — Ambulatory Visit: Admitting: Internal Medicine

## 2023-11-28 VITALS — BP 120/80 | HR 78 | Temp 98.0°F | Ht 60.0 in | Wt 123.0 lb

## 2023-11-28 DIAGNOSIS — G4486 Cervicogenic headache: Secondary | ICD-10-CM | POA: Diagnosis not present

## 2023-11-28 DIAGNOSIS — R7303 Prediabetes: Secondary | ICD-10-CM

## 2023-11-28 DIAGNOSIS — R002 Palpitations: Secondary | ICD-10-CM | POA: Diagnosis not present

## 2023-11-28 DIAGNOSIS — M810 Age-related osteoporosis without current pathological fracture: Secondary | ICD-10-CM

## 2023-11-28 DIAGNOSIS — E782 Mixed hyperlipidemia: Secondary | ICD-10-CM

## 2023-11-28 DIAGNOSIS — E038 Other specified hypothyroidism: Secondary | ICD-10-CM | POA: Diagnosis not present

## 2023-11-28 DIAGNOSIS — N1831 Chronic kidney disease, stage 3a: Secondary | ICD-10-CM

## 2023-11-28 NOTE — Assessment & Plan Note (Signed)
 Chronic Lab Results  Component Value Date   HGBA1C 6.1 10/31/2023   Sugars in prediabetic range Encouraged regular activity/exercise Low sugar/carbohydrate diet

## 2023-11-28 NOTE — Assessment & Plan Note (Signed)
 Chronic GFR 56.96 Mild, stable Avoid NSAIDs Maintain good water intake Has a healthy diet

## 2023-11-28 NOTE — Assessment & Plan Note (Signed)
Chronic Controlled, Stable Continue atenolol 25 mg daily - likely helping to keep BP controlled

## 2023-11-28 NOTE — Assessment & Plan Note (Signed)
 Chronic LDL higher than it has been CT CAC  22.4 in May 2023 Discussed exercise, healthy diet and considering statin

## 2023-11-28 NOTE — Assessment & Plan Note (Signed)
 Chronic DEXA up-to-date  Received Prolia 08/04/2023 Continue Prolia every 6 months Stressed regular exercise, calcium and vitamin D  Last vitamin D Lab Results  Component Value Date   VD25OH 37.51 10/31/2023

## 2023-11-28 NOTE — Assessment & Plan Note (Signed)
 Chronic Controlled with gabapentin 100 mg HS  - continue

## 2023-11-28 NOTE — Assessment & Plan Note (Signed)
 Chronic Clinically euthyroid Lab Results  Component Value Date   TSH 3.55 10/31/2023   TFTs in normal range Continue current dose of levothyroxine-75 mcg daily x 6 days and 112.5 mcg 1 day a week

## 2023-12-02 ENCOUNTER — Encounter (HOSPITAL_BASED_OUTPATIENT_CLINIC_OR_DEPARTMENT_OTHER): Payer: PPO | Admitting: Physical Therapy

## 2023-12-04 ENCOUNTER — Encounter (HOSPITAL_BASED_OUTPATIENT_CLINIC_OR_DEPARTMENT_OTHER): Payer: PPO | Admitting: Physical Therapy

## 2023-12-09 ENCOUNTER — Encounter (HOSPITAL_BASED_OUTPATIENT_CLINIC_OR_DEPARTMENT_OTHER): Payer: PPO

## 2023-12-11 ENCOUNTER — Encounter (HOSPITAL_BASED_OUTPATIENT_CLINIC_OR_DEPARTMENT_OTHER): Payer: PPO | Admitting: Physical Therapy

## 2023-12-17 ENCOUNTER — Encounter (HOSPITAL_BASED_OUTPATIENT_CLINIC_OR_DEPARTMENT_OTHER): Payer: Self-pay | Admitting: Physical Therapy

## 2023-12-17 ENCOUNTER — Ambulatory Visit (HOSPITAL_BASED_OUTPATIENT_CLINIC_OR_DEPARTMENT_OTHER): Admitting: Physical Therapy

## 2023-12-17 DIAGNOSIS — M6281 Muscle weakness (generalized): Secondary | ICD-10-CM

## 2023-12-17 DIAGNOSIS — M25552 Pain in left hip: Secondary | ICD-10-CM | POA: Diagnosis not present

## 2023-12-17 DIAGNOSIS — R262 Difficulty in walking, not elsewhere classified: Secondary | ICD-10-CM

## 2023-12-17 NOTE — Therapy (Signed)
 OUTPATIENT PHYSICAL THERAPY TREATMENT  Progress Note Reporting Period 10/30/2023 to 12/17/2023  See note below for Objective Data and Assessment of Progress/Goals.      Patient Name: Emily [name] MRN: [id number] DOB:[birth year], 83 y.o., female Today's Date: 12/18/2023  END OF SESSION:  PT End of Session - 12/17/23 1419     Visit Number 20    Number of Visits 28    Date for PT Re-Evaluation 01/21/24    Authorization Type HTA, no VL    PT Start Time 1411    PT Stop Time 1453    PT Time Calculation (min) 42 min    Activity Tolerance Patient tolerated treatment well    Behavior During Therapy WFL for tasks assessed/performed                    Past Medical History:  Diagnosis Date   Actinic keratosis    Allergic rhinitis    BACK PAIN    CARCINOMA, BREAST, 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   L s/p lumpectomy, chemo and XRT   Cervicalgia    Cholelithiasis    DIVERTICULOSIS, COLON    ENDOMETRIOSIS    Heart palpitations    HYPOTHYROIDISM    Left wrist fracture 11/06/2016   10/2016 - fell of high stool   Osteoporosis    Palpitations    Personal history of chemotherapy    Personal history of radiation therapy    Pleural plaque without asbestos    CT chest 10/2010: R>L lobes, upper> lower   Renal disorder    Squamous cell carcinoma    History of BC   Past Surgical History:  Procedure Laterality Date   BREAST LUMPECTOMY  2001   left   CATARACT EXTRACTION Bilateral 09/2014   both eyes   CHOLECYSTECTOMY  2010   ENDOMETRIAL ABLATION     EXCISION / BIOPSY BREAST / NIPPLE / DUCT Left 2001   GLUTEUS MINIMUS REPAIR Left 09/09/2023   Procedure: LEFT GLUTEUS MAXIMUS TENDON TRANSFER REPAIR;  Surgeon: Wilhelmenia Harada, MD;  Location: [facility];  Service: Orthopedics;  Laterality: Left;   Patient Active Problem List   Diagnosis Date Noted   RLS (restless legs syndrome) 10/31/2023   Vitamin D  deficiency 10/31/2023   Tendinopathy of left gluteus  medius 09/09/2023   Acute cystitis 04/16/2023   Change in bowel habits 01/15/2023   Anxiety 01/01/2023   Agatston coronary artery calcium score 22.4  (12/2021) 10/30/2022   Urge incontinence of urine 10/30/2022   Word finding difficulty 10/30/2022   Chronic pain of left knee 08/21/2022   Lumbar radiculopathy 03/25/2022   Cough 12/27/2021   Sleep difficulties 09/27/2021   Cervicogenic headache 04/10/2021   Skin abnormality 02/27/2021   Aortic atherosclerosis (HCC) 09/22/2020   Urinary urgency 03/23/2020   CKD (chronic kidney disease) stage 3, GFR 30-59 ml/min (HCC) 03/22/2020   Prediabetes 03/22/2020   Change in stool 11/17/2019   Right-sided chest wall pain 11/17/2019   Urinary retention 09/23/2019   SOB (shortness of breath) 01/15/2019   Upper airway cough syndrome 01/15/2019   Reactive airway disease 12/05/2018   Tear of left rotator cuff 10/05/2018   Pre-syncope 05/19/2018   DDD (degenerative disc disease), cervical 01/15/2018   Neuralgia 09/12/2017   Presbycusis of both ears 12/11/2016   Memory difficulties 11/06/2016   IBS (irritable bowel syndrome) 04/16/2016   Hyperlipidemia 09/10/2015   Osteoporosis 09/08/2015   Palpitations    Pleural plaque without asbestos 12/03/2010   Chronic neck pain 12/18/2009  Diverticulosis of colon 03/02/2009   Lumbago 03/11/2008   Actinic keratosis 01/23/2008   Malignant neoplasm of female breast (HCC) 10/12/2007   Hypothyroidism 06/18/2007   Allergic rhinitis 06/11/2007      REFERRING PROVIDER:  Wilhelmenia Harada, MD   REFERRING DIAG:  [phone] (ICD-10-CM) - Tendinopathy of left gluteus medius    s/p Lt GMR  Rationale for Evaluation and Treatment: Rehabilitation  THERAPY DIAG:  Pain in left hip  Muscle weakness (generalized)  Difficulty in walking, not elsewhere classified  ONSET DATE: DOS 09/09/23   SUBJECTIVE:                                                                                                                                                                                            SUBJECTIVE STATEMENT:  Pt is 14 weeks and 1 day s/p L hip gluteus maximus tendon transfers and trochanteric bursectomy.   FUNCTIONAL IMPROVEMENTS:  Walking.  Pt states everybody says I'm walking great.  Much less pain.  Pt able to do more.  Able to wash her floors. Walking in grocery store.  Pt able to walk at [facility].  FUNCTIONAL LIMITATIONS:  prolonged standing in the kitchen.  Getting in the passenger side of her small SUV.    Pt reports having stiffness when getting up after sitting awhile.     PERTINENT HISTORY:  Urge incontinence, chronic Lt hip & knee pain, OP, bilat occipital neuralgia  PAIN:  Are you having pain?  NPRS scale: 0/10 current, 2-3/10 worst  Pain location:  L posterolateral hip, L lateral knee/distal ITB Pain description:  Aggravating factors:  Relieving factors:   PRECAUTIONS:  None  RED FLAGS: None   WEIGHT BEARING RESTRICTIONS:  Yes WBAT  FALLS:  Has patient fallen in last 6 months? No  LIVING ENVIRONMENT: 2 steps to enter home with handrail on Rt when ascending Does not need to go upstairs- bedroom is on the first floor  OCCUPATION:  retired  PLOF:  Independent  PATIENT GOALS:  Walk without pain and limping, stairs, water walking, I have a treadmill at home.    OBJECTIVE:  Note: Objective measures were completed at Evaluation unless otherwise noted.  TREATMENT DATE:   Gait:  Pt ambulated with a good heel to toe gait pattern without limping.  She slightly favors L LE with increased stance time on R LE.   Stairs:  Pt ascended and descended stairs with a reciprocal gait with the rail with a little pain.  L hip AROM: Flexion:  105 Abduction:  30 ER:  16 IR:  27  R hip AROM: ER: 29 IR:  31  Reviewed current function, goals, and  pain level.  Standing hip abd 2x10 reps L LE, 1x10 R LE Mini squats 2x10 with bilat UE assist on counter  FOTO:  Initial/Current:  28/60.  Goal of 46    PATIENT EDUCATION:  Education details:  exercise form, relevant anatomy, objective findings, goal progress, and HEP.  Person educated: Patient Education method: Explanation, demonstration, verbal cues Education comprehension: verbalized understanding, returned demonstration, verbal cues required  HOME EXERCISE PROGRAM: [email or web address]     ASSESSMENT:  CLINICAL IMPRESSION: Pt is 14 weeks and 1 day post op.  Pt is making good progress functionally.  She has improved tolerance to activity and his performing more household chores.  Pt is increasing her ambulation distance and has improved gait.  Pt reports she is limited with standing activities in her kitchen and has some difficulty with car transfers.  Pt has improved hip ROM.  She is able to perform stairs independently with a reciprocal gait with the rail.  Pt has not performed much land based closed chain activity.   Pt demonstrates clinically significant improvement in self perceived disability with FOTO improving from 28 to 60.  Pt met her FOTO goal.  Pt has met all STG's and LTG #3 and partially met LTG #1.  Pt responded well to Rx and should benefit from cont skilled PT to address impairments and goals and to assist in restoring desired level of function.      REHAB POTENTIAL: Good  CLINICAL DECISION MAKING: Stable/uncomplicated  EVALUATION COMPLEXITY: Low   GOALS: Goals reviewed with patient? Yes  SHORT TERM GOALS: Target date: 10/07/23 (4 wks post op)  Able to demo gait with proper pattern for houshold distances without AD Baseline: Goal status: Met - 10/23/23  2.  Hip flexion to 90 deg without pain Baseline:  Goal status:Met- 09/22/23  3.  Good quad and glut activation in CKC Baseline:  Goal status: Met 11/11/23   LONG TERM GOALS: Target date: 6/4/25025  Perform  stairs step over step without pain Baseline:  Goal status: 80% MET   4/30  2.  MMT 90% of opposite lower extremity Baseline:  Goal status: INITIAL  3.  Ambulate community distances without pain Baseline:  Goal status:  GOAL MET  4/30  4.  Perform proper pelvic control in squat and SLS Baseline:  Goal status: INITIAL  5.  Pt will report she is able to perform activities in the kitchen with improved ease and tolerance.   Goal status:  INITIAL    PLAN:  PT FREQUENCY: 1-2x/week  PT DURATION:  5 weeks   PLANNED INTERVENTIONS: 97164- PT Re-evaluation, 97110-Therapeutic exercises, 97530- Therapeutic activity, 97112- Neuromuscular re-education, 97535- Self Care, [id number]- Manual therapy, [phone]- Gait training, [phone]- Aquatic Therapy, Patient/Family education, Balance training, Stair training, Taping, Dry Needling, Joint mobilization, Spinal mobilization, Scar mobilization, Cryotherapy, and Moist heat.  PLAN FOR NEXT SESSION:  Continue per Dr. Verline Glow glute med repair protocol.  Monitor L knee and ITB pain.    Trina Fujita III PT, DPT 12/18/23 10:23 AM

## 2023-12-19 ENCOUNTER — Ambulatory Visit (HOSPITAL_BASED_OUTPATIENT_CLINIC_OR_DEPARTMENT_OTHER): Attending: Orthopaedic Surgery | Admitting: Physical Therapy

## 2023-12-19 ENCOUNTER — Encounter (HOSPITAL_BASED_OUTPATIENT_CLINIC_OR_DEPARTMENT_OTHER): Payer: Self-pay | Admitting: Physical Therapy

## 2023-12-19 DIAGNOSIS — R262 Difficulty in walking, not elsewhere classified: Secondary | ICD-10-CM | POA: Diagnosis not present

## 2023-12-19 DIAGNOSIS — M6281 Muscle weakness (generalized): Secondary | ICD-10-CM | POA: Diagnosis not present

## 2023-12-19 DIAGNOSIS — M25552 Pain in left hip: Secondary | ICD-10-CM | POA: Insufficient documentation

## 2023-12-19 NOTE — Therapy (Signed)
 OUTPATIENT PHYSICAL THERAPY TREATMENT      Patient Name: Emily [name] MRN: [id number] DOB:[birth year], 83 y.o., female Today's Date: 12/19/2023  END OF SESSION:  PT End of Session - 12/19/23 1154     Visit Number 21    Number of Visits 28    Date for PT Re-Evaluation 01/21/24    Authorization Type HTA, no VL    PT Start Time 1115    PT Stop Time 1159    PT Time Calculation (min) 44 min    Activity Tolerance Patient tolerated treatment well    Behavior During Therapy WFL for tasks assessed/performed                     Past Medical History:  Diagnosis Date   Actinic keratosis    Allergic rhinitis    BACK PAIN    CARCINOMA, BREAST, 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   L s/p lumpectomy, chemo and XRT   Cervicalgia    Cholelithiasis    DIVERTICULOSIS, COLON    ENDOMETRIOSIS    Heart palpitations    HYPOTHYROIDISM    Left wrist fracture 11/06/2016   10/2016 - fell of high stool   Osteoporosis    Palpitations    Personal history of chemotherapy    Personal history of radiation therapy    Pleural plaque without asbestos    CT chest 10/2010: R>L lobes, upper> lower   Renal disorder    Squamous cell carcinoma    History of BC   Past Surgical History:  Procedure Laterality Date   BREAST LUMPECTOMY  2001   left   CATARACT EXTRACTION Bilateral 09/2014   both eyes   CHOLECYSTECTOMY  2010   ENDOMETRIAL ABLATION     EXCISION / BIOPSY BREAST / NIPPLE / DUCT Left 2001   GLUTEUS MINIMUS REPAIR Left 09/09/2023   Procedure: LEFT GLUTEUS MAXIMUS TENDON TRANSFER REPAIR;  Surgeon: Wilhelmenia Harada, MD;  Location: [facility];  Service: Orthopedics;  Laterality: Left;   Patient Active Problem List   Diagnosis Date Noted   RLS (restless legs syndrome) 10/31/2023   Vitamin D  deficiency 10/31/2023   Tendinopathy of left gluteus medius 09/09/2023   Acute cystitis 04/16/2023   Change in bowel habits 01/15/2023   Anxiety 01/01/2023   Agatston coronary  artery calcium score 22.4  (12/2021) 10/30/2022   Urge incontinence of urine 10/30/2022   Word finding difficulty 10/30/2022   Chronic pain of left knee 08/21/2022   Lumbar radiculopathy 03/25/2022   Cough 12/27/2021   Sleep difficulties 09/27/2021   Cervicogenic headache 04/10/2021   Skin abnormality 02/27/2021   Aortic atherosclerosis (HCC) 09/22/2020   Urinary urgency 03/23/2020   CKD (chronic kidney disease) stage 3, GFR 30-59 ml/min (HCC) 03/22/2020   Prediabetes 03/22/2020   Change in stool 11/17/2019   Right-sided chest wall pain 11/17/2019   Urinary retention 09/23/2019   SOB (shortness of breath) 01/15/2019   Upper airway cough syndrome 01/15/2019   Reactive airway disease 12/05/2018   Tear of left rotator cuff 10/05/2018   Pre-syncope 05/19/2018   DDD (degenerative disc disease), cervical 01/15/2018   Neuralgia 09/12/2017   Presbycusis of both ears 12/11/2016   Memory difficulties 11/06/2016   IBS (irritable bowel syndrome) 04/16/2016   Hyperlipidemia 09/10/2015   Osteoporosis 09/08/2015   Palpitations    Pleural plaque without asbestos 12/03/2010   Chronic neck pain 12/18/2009   Diverticulosis of colon 03/02/2009   Lumbago 03/11/2008   Actinic keratosis 01/23/2008   Malignant  neoplasm of female breast (HCC) 10/12/2007   Hypothyroidism 06/18/2007   Allergic rhinitis 06/11/2007      REFERRING PROVIDER:  Wilhelmenia Harada, MD   REFERRING DIAG:  [id number] (ICD-10-CM) - Tendinopathy of left gluteus medius    s/p Lt GMR  Rationale for Evaluation and Treatment: Rehabilitation  THERAPY DIAG:  Pain in left hip  Muscle weakness (generalized)  Difficulty in walking, not elsewhere classified  ONSET DATE: DOS 09/09/23   SUBJECTIVE:                                                                                                                                                                                           SUBJECTIVE STATEMENT:  Pt is 14 weeks and 3  days s/p L hip gluteus maximus tendon transfers and trochanteric bursectomy.  Pt denies any adverse effects after prior Rx.  Pt reports she has some lateral L knee pain with ambulation this AM.  Pt hasn't been doing home exercises though states she has been doing enough in her home.  Pt states her steps at home are harder than the ones here.  FUNCTIONAL IMPROVEMENTS:  Walking.  Much less pain.  Pt able to do more.  Able to wash her floors. Walking in grocery store.  Pt able to walk at [facility].  FUNCTIONAL LIMITATIONS:  prolonged standing in the kitchen.  Getting in the passenger side of her small SUV.    Pt reports having stiffness when getting up after sitting awhile.     PERTINENT HISTORY:  Urge incontinence, chronic Lt hip & knee pain, OP, bilat occipital neuralgia  PAIN:  Are you having pain?  NPRS scale: 0/10 current, 2-3/10 worst  Pain location:  L posterolateral hip, L lateral knee/distal ITB Pain description:  Aggravating factors:  Relieving factors:   PRECAUTIONS:  None  RED FLAGS: None   WEIGHT BEARING RESTRICTIONS:  Yes WBAT  FALLS:  Has patient fallen in last 6 months? No  LIVING ENVIRONMENT: 2 steps to enter home with handrail on Rt when ascending Does not need to go upstairs- bedroom is on the first floor  OCCUPATION:  retired  PLOF:  Independent  PATIENT GOALS:  Walk without pain and limping, stairs, water walking, I have a treadmill at home.    OBJECTIVE:  Note: Objective measures were completed at Evaluation unless otherwise noted.  TREATMENT DATE:   Reviewed current function,  response to prior Rx, and pain level.  Nustep L2-3 UE/LE's x 5 mins Supine bridge 2x10 Standing hip abd 2x10 reps L LE, 1x10 R LE Mini squats 2x10 with bilat UE assist on counter Standing marching x10 reps with UE support on counter Stool  rotations 2x10  Sidestepping x 2 laps with UE support Step ups on 6 inch step 2x10 with UE support L SLS with bilat UE support 3x20 sec   Pt received L hip flexion, abd, and ER/IR PROM in supine per pt and tissue tolerance.    PATIENT EDUCATION:  Education details:  exercise form, relevant anatomy, rationale of interventions, and HEP.  Person educated: Patient Education method: Explanation, demonstration, verbal cues Education comprehension: verbalized understanding, returned demonstration, verbal cues required  HOME EXERCISE PROGRAM: [email or web address]     ASSESSMENT:  CLINICAL IMPRESSION: She is able to perform stairs independently with a reciprocal gait with the rail in the clinic though states her stairs at home are more difficult.  Pt has been out of town and PT completed a PN last visit.  PT focused on increasing land based exercises today and she tolerated exercises well.  Pt performed exercises per protocol well with cuing and instruction in correct form and positioning.  Pt required UE support with closed chain exercises.  Pt tolerated hip PROM well.  She responded well to Rx stating she felt good after Rx.  Pt should benefit from cont skilled PT per protocol to address impairments and goals and to assist in restoring desired level of function.      REHAB POTENTIAL: Good  CLINICAL DECISION MAKING: Stable/uncomplicated  EVALUATION COMPLEXITY: Low   GOALS: Goals reviewed with patient? Yes  SHORT TERM GOALS: Target date: 10/07/23 (4 wks post op)  Able to demo gait with proper pattern for houshold distances without AD Baseline: Goal status: Met - 10/23/23  2.  Hip flexion to 90 deg without pain Baseline:  Goal status:Met- 09/22/23  3.  Good quad and glut activation in CKC Baseline:  Goal status: Met 11/11/23   LONG TERM GOALS: Target date: 6/4/25025  Perform stairs step over step without pain Baseline:  Goal status: 80% MET   4/30  2.  MMT 90% of opposite lower  extremity Baseline:  Goal status: INITIAL  3.  Ambulate community distances without pain Baseline:  Goal status:  GOAL MET  4/30  4.  Perform proper pelvic control in squat and SLS Baseline:  Goal status: INITIAL  5.  Pt will report she is able to perform activities in the kitchen with improved ease and tolerance.   Goal status:  INITIAL    PLAN:  PT FREQUENCY: 1-2x/week  PT DURATION:  5 weeks   PLANNED INTERVENTIONS: 97164- PT Re-evaluation, 97110-Therapeutic exercises, 97530- Therapeutic activity, 97112- Neuromuscular re-education, 97535- Self Care, 16109- Manual therapy, [phone]- Gait training, [phone]- Aquatic Therapy, Patient/Family education, Balance training, Stair training, Taping, Dry Needling, Joint mobilization, Spinal mobilization, Scar mobilization, Cryotherapy, and Moist heat.  PLAN FOR NEXT SESSION:  Continue per Dr. Verline Glow glute med repair protocol.  Monitor L knee and ITB pain.    Trina Fujita III PT, DPT 12/19/23 12:11 PM

## 2023-12-24 ENCOUNTER — Encounter (HOSPITAL_BASED_OUTPATIENT_CLINIC_OR_DEPARTMENT_OTHER): Payer: Self-pay | Admitting: Physical Therapy

## 2023-12-24 ENCOUNTER — Ambulatory Visit (HOSPITAL_BASED_OUTPATIENT_CLINIC_OR_DEPARTMENT_OTHER): Admitting: Physical Therapy

## 2023-12-24 DIAGNOSIS — M6281 Muscle weakness (generalized): Secondary | ICD-10-CM

## 2023-12-24 DIAGNOSIS — R262 Difficulty in walking, not elsewhere classified: Secondary | ICD-10-CM

## 2023-12-24 DIAGNOSIS — M25552 Pain in left hip: Secondary | ICD-10-CM | POA: Diagnosis not present

## 2023-12-24 NOTE — Therapy (Unsigned)
 OUTPATIENT PHYSICAL THERAPY TREATMENT      Patient Name: Narjis Bogue MRN: [id number] DOB:1941/03/18, 83 y.o., female Today's Date: 12/25/2023  END OF SESSION:  PT End of Session - 12/24/23 0951     Visit Number 22    Number of Visits 28    Date for PT Re-Evaluation 01/21/24    Authorization Type HTA, no VL    PT Start Time 0945   PT started late due to pt having to use the restroom after checking in.   PT Stop Time 1027    PT Time Calculation (min) 42 min    Activity Tolerance Patient tolerated treatment well    Behavior During Therapy WFL for tasks assessed/performed                      Past Medical History:  Diagnosis Date   Actinic keratosis    Allergic rhinitis    BACK PAIN    CARCINOMA, BREAST, 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   L s/p lumpectomy, chemo and XRT   Cervicalgia    Cholelithiasis    DIVERTICULOSIS, COLON    ENDOMETRIOSIS    Heart palpitations    HYPOTHYROIDISM    Left wrist fracture 11/06/2016   10/2016 - fell of high stool   Osteoporosis    Palpitations    Personal history of chemotherapy    Personal history of radiation therapy    Pleural plaque without asbestos    CT chest 10/2010: R>L lobes, upper> lower   Renal disorder    Squamous cell carcinoma    History of BC   Past Surgical History:  Procedure Laterality Date   BREAST LUMPECTOMY  2001   left   CATARACT EXTRACTION Bilateral 09/2014   both eyes   CHOLECYSTECTOMY  2010   ENDOMETRIAL ABLATION     EXCISION / BIOPSY BREAST / NIPPLE / DUCT Left 2001   GLUTEUS MINIMUS REPAIR Left 09/09/2023   Procedure: LEFT GLUTEUS MAXIMUS TENDON TRANSFER REPAIR;  Surgeon: Wilhelmenia Harada, MD;  Location: [facility];  Service: Orthopedics;  Laterality: Left;   Patient Active Problem List   Diagnosis Date Noted   RLS (restless legs syndrome) 10/31/2023   Vitamin D  deficiency 10/31/2023   Tendinopathy of left gluteus medius 09/09/2023   Acute cystitis 04/16/2023    Change in bowel habits 01/15/2023   Anxiety 01/01/2023   Agatston coronary artery calcium score 22.4  (12/2021) 10/30/2022   Urge incontinence of urine 10/30/2022   Word finding difficulty 10/30/2022   Chronic pain of left knee 08/21/2022   Lumbar radiculopathy 03/25/2022   Cough 12/27/2021   Sleep difficulties 09/27/2021   Cervicogenic headache 04/10/2021   Skin abnormality 02/27/2021   Aortic atherosclerosis (HCC) 09/22/2020   Urinary urgency 03/23/2020   CKD (chronic kidney disease) stage 3, GFR 30-59 ml/min (HCC) 03/22/2020   Prediabetes 03/22/2020   Change in stool 11/17/2019   Right-sided chest wall pain 11/17/2019   Urinary retention 09/23/2019   SOB (shortness of breath) 01/15/2019   Upper airway cough syndrome 01/15/2019   Reactive airway disease 12/05/2018   Tear of left rotator cuff 10/05/2018   Pre-syncope 05/19/2018   DDD (degenerative disc disease), cervical 01/15/2018   Neuralgia 09/12/2017   Presbycusis of both ears 12/11/2016   Memory difficulties 11/06/2016   IBS (irritable bowel syndrome) 04/16/2016   Hyperlipidemia 09/10/2015   Osteoporosis 09/08/2015   Palpitations    Pleural plaque without asbestos 12/03/2010   Chronic neck pain 12/18/2009  Diverticulosis of colon 03/02/2009   Lumbago 03/11/2008   Actinic keratosis 01/23/2008   Malignant neoplasm of female breast (HCC) 10/12/2007   Hypothyroidism 06/18/2007   Allergic rhinitis 06/11/2007      REFERRING PROVIDER:  Wilhelmenia Harada, MD   REFERRING DIAG:  [phone] (ICD-10-CM) - Tendinopathy of left gluteus medius    s/p Lt GMR  Rationale for Evaluation and Treatment: Rehabilitation  THERAPY DIAG:  Pain in left hip  Muscle weakness (generalized)  Difficulty in walking, not elsewhere classified  ONSET DATE: DOS 09/09/23   SUBJECTIVE:                                                                                                                                                                                            SUBJECTIVE STATEMENT:  Pt is 15 weeks and 1 day s/p L hip gluteus maximus tendon transfers and trochanteric bursectomy.  Pt denies any adverse effects after prior Rx.  Pt states her knee is bothering her.  Pt states her ITB is bothering her.  Pt reports having lateral and anterior knee pain.  Pt reports she has some lateral L knee pain with ambulation this AM.  Pt hasn't been doing home exercises though states she has been doing enough in her home.  Pt states her steps at home are harder than the ones here.  Pt reports having stiffness when getting up after sitting awhile.     PERTINENT HISTORY:  Urge incontinence, chronic Lt hip & knee pain, OP, bilat occipital neuralgia  PAIN:  Are you having pain?  NPRS scale: 0/10 current, 2-3/10 worst  Pain location:  L posterolateral hip, L lateral knee/distal ITB Pain description:  Aggravating factors:  Relieving factors:   PRECAUTIONS:  None  RED FLAGS: None   WEIGHT BEARING RESTRICTIONS:  Yes WBAT  FALLS:  Has patient fallen in last 6 months? No  LIVING ENVIRONMENT: 2 steps to enter home with handrail on Rt when ascending Does not need to go upstairs- bedroom is on the first floor  OCCUPATION:  retired  PLOF:  Independent  PATIENT GOALS:  Walk without pain and limping, stairs, water walking, I have a treadmill at home.    OBJECTIVE:  Note: Objective measures were completed at Evaluation unless otherwise noted.  TREATMENT DATE:   Reviewed current function,  response to prior Rx, and pain level.  Nustep L2-3 UE/LE's x 5 mins Supine bridge 2x10 Mini squats 2x10 with bilat UE assist on counter Sidestepping x 1 lap with UE support Lateral band walks with RTB around thighs x2 laps at rail Step ups on 6 inch step 2x10 with UE support L SLS with bilat UE support x20 sec   Hip hiking x 10 bilat with UE support on rail  Pt received L hip flexion, abd, and ER/IR PROM in supine per pt and tissue tolerance.  Manual Therapy: STM to L ITB in R S/L'ing with pillow b/w knees.     PATIENT EDUCATION:  Education details:  exercise form, relevant anatomy, rationale of interventions, and HEP.  PT educated pt in performing self STM to ITB.  Person educated: Patient Education method: Explanation, demonstration, verbal cues Education comprehension: verbalized understanding, returned demonstration, verbal cues required  HOME EXERCISE PROGRAM: [email or web address]     ASSESSMENT:  CLINICAL IMPRESSION: Pt presents to Rx Pt stating her knee and ITB are bothering her.  PT performed STM to ITB and she has tenderness with STM to L ITB.  PT palpated R ITB and she less tightness and significantly less tenderness compared to L.  Pt state she feels better and more loose after STM to L ITB.  PT is progressing land based exercises per protocol and she performed exercises well with cuing and instruction in correct form.  Pt states the L knee pain was starting after performing SLS and PT had pt stop SLS.  Pt responded well to Rx and had no c/o's after Rx.   Pt should benefit from cont skilled PT per protocol to address impairments and goals and to assist in restoring desired level of function.     REHAB POTENTIAL: Good  CLINICAL DECISION MAKING: Stable/uncomplicated  EVALUATION COMPLEXITY: Low   GOALS: Goals reviewed with patient? Yes  SHORT TERM GOALS: Target date: 10/07/23 (4 wks post op)  Able to demo gait with proper pattern for houshold distances without AD Baseline: Goal status: Met - 10/23/23  2.  Hip flexion to 90 deg without pain Baseline:  Goal status:Met- 09/22/23  3.  Good quad and glut activation in CKC Baseline:  Goal status: Met 11/11/23   LONG TERM GOALS: Target date: 6/4/25025  Perform stairs step over step without pain Baseline:  Goal status: 80% MET    4/30  2.  MMT 90% of opposite lower extremity Baseline:  Goal status: INITIAL  3.  Ambulate community distances without pain Baseline:  Goal status:  GOAL MET  4/30  4.  Perform proper pelvic control in squat and SLS Baseline:  Goal status: INITIAL  5.  Pt will report she is able to perform activities in the kitchen with improved ease and tolerance.   Goal status:  INITIAL    PLAN:  PT FREQUENCY: 1-2x/week  PT DURATION:  5 weeks   PLANNED INTERVENTIONS: 97164- PT Re-evaluation, 97110-Therapeutic exercises, 97530- Therapeutic activity, 97112- Neuromuscular re-education, 97535- Self Care, 21308- Manual therapy, [phone]- Gait training, [phone]- Aquatic Therapy, Patient/Family education, Balance training, Stair training, Taping, Dry Needling, Joint mobilization, Spinal mobilization, Scar mobilization, Cryotherapy, and Moist heat.  PLAN FOR NEXT SESSION:  Continue per Dr. Verline Glow glute med repair protocol.  Monitor L knee and ITB pain.    Trina Fujita III PT, DPT 12/25/23 3:37 PM

## 2023-12-26 ENCOUNTER — Encounter (HOSPITAL_BASED_OUTPATIENT_CLINIC_OR_DEPARTMENT_OTHER): Payer: Self-pay | Admitting: Physical Therapy

## 2023-12-26 ENCOUNTER — Other Ambulatory Visit: Payer: Self-pay | Admitting: Internal Medicine

## 2023-12-26 ENCOUNTER — Ambulatory Visit (HOSPITAL_BASED_OUTPATIENT_CLINIC_OR_DEPARTMENT_OTHER): Admitting: Physical Therapy

## 2023-12-26 ENCOUNTER — Encounter (HOSPITAL_COMMUNITY): Payer: Self-pay

## 2023-12-26 DIAGNOSIS — M25552 Pain in left hip: Secondary | ICD-10-CM | POA: Diagnosis not present

## 2023-12-26 DIAGNOSIS — M6281 Muscle weakness (generalized): Secondary | ICD-10-CM

## 2023-12-26 DIAGNOSIS — R262 Difficulty in walking, not elsewhere classified: Secondary | ICD-10-CM

## 2023-12-26 NOTE — Therapy (Signed)
 OUTPATIENT PHYSICAL THERAPY TREATMENT      Patient Name: Emily [name] MRN: [id number] DOB:03-Nov-1940, 83 y.o., female Today's Date: 12/26/2023  END OF SESSION:  PT End of Session - 12/26/23 0948     Visit Number 23    Number of Visits 28    Date for PT Re-Evaluation 01/21/24    Authorization Type HTA, no VL    PT Start Time 0945   PT began late due to pt having to use the restroom   PT Stop Time 1026    PT Time Calculation (min) 41 min    Activity Tolerance Patient tolerated treatment well    Behavior During Therapy WFL for tasks assessed/performed                       Past Medical History:  Diagnosis Date   Actinic keratosis    Allergic rhinitis    BACK PAIN    CARCINOMA, BREAST, 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   L s/p lumpectomy, chemo and XRT   Cervicalgia    Cholelithiasis    DIVERTICULOSIS, COLON    ENDOMETRIOSIS    Heart palpitations    HYPOTHYROIDISM    Left wrist fracture 11/06/2016   10/2016 - fell of high stool   Osteoporosis    Palpitations    Personal history of chemotherapy    Personal history of radiation therapy    Pleural plaque without asbestos    CT chest 10/2010: R>L lobes, upper> lower   Renal disorder    Squamous cell carcinoma    History of BC   Past Surgical History:  Procedure Laterality Date   BREAST LUMPECTOMY  2001   left   CATARACT EXTRACTION Bilateral 09/2014   both eyes   CHOLECYSTECTOMY  2010   ENDOMETRIAL ABLATION     EXCISION / BIOPSY BREAST / NIPPLE / DUCT Left 2001   GLUTEUS MINIMUS REPAIR Left 09/09/2023   Procedure: LEFT GLUTEUS MAXIMUS TENDON TRANSFER REPAIR;  Surgeon: Wilhelmenia Harada, MD;  Location: [facility];  Service: Orthopedics;  Laterality: Left;   Patient Active Problem List   Diagnosis Date Noted   RLS (restless legs syndrome) 10/31/2023   Vitamin D  deficiency 10/31/2023   Tendinopathy of left gluteus medius 09/09/2023   Acute cystitis 04/16/2023   Change in bowel habits  01/15/2023   Anxiety 01/01/2023   Agatston coronary artery calcium score 22.4  (12/2021) 10/30/2022   Urge incontinence of urine 10/30/2022   Word finding difficulty 10/30/2022   Chronic pain of left knee 08/21/2022   Lumbar radiculopathy 03/25/2022   Cough 12/27/2021   Sleep difficulties 09/27/2021   Cervicogenic headache 04/10/2021   Skin abnormality 02/27/2021   Aortic atherosclerosis (HCC) 09/22/2020   Urinary urgency 03/23/2020   CKD (chronic kidney disease) stage 3, GFR 30-59 ml/min (HCC) 03/22/2020   Prediabetes 03/22/2020   Change in stool 11/17/2019   Right-sided chest wall pain 11/17/2019   Urinary retention 09/23/2019   SOB (shortness of breath) 01/15/2019   Upper airway cough syndrome 01/15/2019   Reactive airway disease 12/05/2018   Tear of left rotator cuff 10/05/2018   Pre-syncope 05/19/2018   DDD (degenerative disc disease), cervical 01/15/2018   Neuralgia 09/12/2017   Presbycusis of both ears 12/11/2016   Memory difficulties 11/06/2016   IBS (irritable bowel syndrome) 04/16/2016   Hyperlipidemia 09/10/2015   Osteoporosis 09/08/2015   Palpitations    Pleural plaque without asbestos 12/03/2010   Chronic neck pain 12/18/2009   Diverticulosis of  colon 03/02/2009   Lumbago 03/11/2008   Actinic keratosis 01/23/2008   Malignant neoplasm of female breast (HCC) 10/12/2007   Hypothyroidism 06/18/2007   Allergic rhinitis 06/11/2007      REFERRING PROVIDER:  Wilhelmenia Harada, MD   REFERRING DIAG:  [phone] (ICD-10-CM) - Tendinopathy of left gluteus medius    s/p Lt GMR  Rationale for Evaluation and Treatment: Rehabilitation  THERAPY DIAG:  Pain in left hip  Muscle weakness (generalized)  Difficulty in walking, not elsewhere classified  ONSET DATE: DOS 09/09/23   SUBJECTIVE:                                                                                                                                                                                            SUBJECTIVE STATEMENT:  Pt is 15 weeks and 3 days s/p L hip gluteus maximus tendon transfers and trochanteric bursectomy.  Pt denies any adverse effects after prior Rx.  Pt states she did some of her exercises at her home yesterday.  She did her stairs at home which are coming along.   Pt states her knee and ITB are bothering her.  Pt performed self massage to ITB.      PERTINENT HISTORY:  Urge incontinence, chronic Lt hip & knee pain, OP, bilat occipital neuralgia  PAIN:  Are you having pain?  NPRS scale: 0/10 current, 2-3/10 worst  Pain location:  L posterolateral hip, L lateral knee/distal ITB Pain description:  Aggravating factors:  Relieving factors:   PRECAUTIONS:  None  RED FLAGS: None   WEIGHT BEARING RESTRICTIONS:  Yes WBAT  FALLS:  Has patient fallen in last 6 months? No  LIVING ENVIRONMENT: 2 steps to enter home with handrail on Rt when ascending Does not need to go upstairs- bedroom is on the first floor  OCCUPATION:  retired  PLOF:  Independent  PATIENT GOALS:  Walk without pain and limping, stairs, water walking, I have a treadmill at home.    OBJECTIVE:  Note: Objective measures were completed at Evaluation unless otherwise noted.  TREATMENT DATE:   Reviewed pt presentation,  response to prior Rx, and pain level.  Nustep L3 UE/LE's x 5 mins Mini squats 2x10 with bilat UE assist on counter Lateral band walks with RTB around thighs x2 laps at rail Step ups on 6 inch step 2x10 with UE support Hip hiking 2 x 10 bilat with UE support on rail Standing hip abd x 10 bilat, x10 reps with YTB x 10 on L  PT updated HEP and gave pt a HEP handout.  PT educated pt in correct form and appropriate frequency.  PT instructed she should not have pain with HEP.    Manual Therapy: STM to L ITB in R S/L'ing with pillow b/w knees.      PATIENT EDUCATION:  Education details:  exercise form, relevant anatomy, rationale of interventions, and HEP.  PT educated pt in performing self STM to ITB.  Person educated: Patient Education method: Explanation, demonstration, verbal cues Education comprehension: verbalized understanding, returned demonstration, verbal cues required  HOME EXERCISE PROGRAM: [email or web address]  Updated HEP:  - Mini Squat with Counter Support  - 1 x daily - 3-4 x weekly - 2 sets - 10 reps - Standing Hip Abduction with Counter Support  - 1 x daily - 5 x weekly - 2 sets - 10 reps     ASSESSMENT:  CLINICAL IMPRESSION: Pt is progressing with land based exercises.  She performed exercises per protocol well with cuing for correct form.  She demonstrated good form with squats.  Pt had good tolerance with exercises.  Pt has ben having pain in ITB/knee.  She has soft tissue tightness and tenderness in ITB.  PT performed STM to ITB and she has performed some self massage at home.  PT updated HEP and gave pt a HEP handout.  Pt demonstrates good understanding of HEP.  Pt responded well to Rx and had no increased pain and no c/o's after Rx.   Pt should benefit from cont skilled PT per protocol to address impairments and goals and to assist in restoring desired level of function.     REHAB POTENTIAL: Good  CLINICAL DECISION MAKING: Stable/uncomplicated  EVALUATION COMPLEXITY: Low   GOALS: Goals reviewed with patient? Yes  SHORT TERM GOALS: Target date: 10/07/23 (4 wks post op)  Able to demo gait with proper pattern for houshold distances without AD Baseline: Goal status: Met - 10/23/23  2.  Hip flexion to 90 deg without pain Baseline:  Goal status:Met- 09/22/23  3.  Good quad and glut activation in CKC Baseline:  Goal status: Met 11/11/23   LONG TERM GOALS: Target date: 6/4/25025  Perform stairs step over step without pain Baseline:  Goal status: 80% MET   4/30  2.  MMT 90% of opposite lower  extremity Baseline:  Goal status: INITIAL  3.  Ambulate community distances without pain Baseline:  Goal status:  GOAL MET  4/30  4.  Perform proper pelvic control in squat and SLS Baseline:  Goal status: INITIAL  5.  Pt will report she is able to perform activities in the kitchen with improved ease and tolerance.   Goal status:  INITIAL    PLAN:  PT FREQUENCY: 1-2x/week  PT DURATION:  5 weeks   PLANNED INTERVENTIONS: 97164- PT Re-evaluation, 97110-Therapeutic exercises, 97530- Therapeutic activity, 97112- Neuromuscular re-education, 97535- Self Care, 54098- Manual therapy, [phone]- Gait training, [phone]- Aquatic Therapy, Patient/Family education, Balance training, Stair training, Taping, Dry Needling, Joint mobilization, Spinal mobilization, Scar mobilization, Cryotherapy, and Moist heat.  PLAN FOR NEXT SESSION:  Continue per Dr. Verline Glow glute med repair protocol.  Monitor L knee and ITB pain.    Trina Fujita III PT, DPT 12/26/23 11:27 PM

## 2023-12-31 ENCOUNTER — Encounter (HOSPITAL_BASED_OUTPATIENT_CLINIC_OR_DEPARTMENT_OTHER): Payer: Self-pay | Admitting: Physical Therapy

## 2023-12-31 ENCOUNTER — Ambulatory Visit (HOSPITAL_BASED_OUTPATIENT_CLINIC_OR_DEPARTMENT_OTHER): Admitting: Physical Therapy

## 2023-12-31 DIAGNOSIS — M25552 Pain in left hip: Secondary | ICD-10-CM

## 2023-12-31 DIAGNOSIS — M6281 Muscle weakness (generalized): Secondary | ICD-10-CM

## 2023-12-31 DIAGNOSIS — R262 Difficulty in walking, not elsewhere classified: Secondary | ICD-10-CM

## 2023-12-31 NOTE — Therapy (Signed)
 OUTPATIENT PHYSICAL THERAPY TREATMENT      Patient Name: Emily [name] MRN: [id number] DOB:[birth year], 83 y.o., female Today's Date: 01/01/2024  END OF SESSION:  PT End of Session - 12/31/23 1327     Visit Number 24    Number of Visits 28    Date for PT Re-Evaluation 01/21/24    Authorization Type HTA, no VL    PT Start Time 1325    PT Stop Time 1404    PT Time Calculation (min) 39 min    Activity Tolerance Patient tolerated treatment well    Behavior During Therapy WFL for tasks assessed/performed                       Past Medical History:  Diagnosis Date   Actinic keratosis    Allergic rhinitis    BACK PAIN    CARCINOMA, BREAST, 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   L s/p lumpectomy, chemo and XRT   Cervicalgia    Cholelithiasis    DIVERTICULOSIS, COLON    ENDOMETRIOSIS    Heart palpitations    HYPOTHYROIDISM    Left wrist fracture 11/06/2016   10/2016 - fell of high stool   Osteoporosis    Palpitations    Personal history of chemotherapy    Personal history of radiation therapy    Pleural plaque without asbestos    CT chest 10/2010: R>L lobes, upper> lower   Renal disorder    Squamous cell carcinoma    History of BC   Past Surgical History:  Procedure Laterality Date   BREAST LUMPECTOMY  2001   left   CATARACT EXTRACTION Bilateral 09/2014   both eyes   CHOLECYSTECTOMY  2010   ENDOMETRIAL ABLATION     EXCISION / BIOPSY BREAST / NIPPLE / DUCT Left 2001   GLUTEUS MINIMUS REPAIR Left 09/09/2023   Procedure: LEFT GLUTEUS MAXIMUS TENDON TRANSFER REPAIR;  Surgeon: Wilhelmenia Harada, MD;  Location: [facility];  Service: Orthopedics;  Laterality: Left;   Patient Active Problem List   Diagnosis Date Noted   RLS (restless legs syndrome) 10/31/2023   Vitamin D  deficiency 10/31/2023   Tendinopathy of left gluteus medius 09/09/2023   Acute cystitis 04/16/2023   Change in bowel habits 01/15/2023   Anxiety 01/01/2023   Agatston  coronary artery calcium score 22.4  (12/2021) 10/30/2022   Urge incontinence of urine 10/30/2022   Word finding difficulty 10/30/2022   Chronic pain of left knee 08/21/2022   Lumbar radiculopathy 03/25/2022   Cough 12/27/2021   Sleep difficulties 09/27/2021   Cervicogenic headache 04/10/2021   Skin abnormality 02/27/2021   Aortic atherosclerosis (HCC) 09/22/2020   Urinary urgency 03/23/2020   CKD (chronic kidney disease) stage 3, GFR 30-59 ml/min (HCC) 03/22/2020   Prediabetes 03/22/2020   Change in stool 11/17/2019   Right-sided chest wall pain 11/17/2019   Urinary retention 09/23/2019   SOB (shortness of breath) 01/15/2019   Upper airway cough syndrome 01/15/2019   Reactive airway disease 12/05/2018   Tear of left rotator cuff 10/05/2018   Pre-syncope 05/19/2018   DDD (degenerative disc disease), cervical 01/15/2018   Neuralgia 09/12/2017   Presbycusis of both ears 12/11/2016   Memory difficulties 11/06/2016   IBS (irritable bowel syndrome) 04/16/2016   Hyperlipidemia 09/10/2015   Osteoporosis 09/08/2015   Palpitations    Pleural plaque without asbestos 12/03/2010   Chronic neck pain 12/18/2009   Diverticulosis of colon 03/02/2009   Lumbago 03/11/2008   Actinic keratosis 01/23/2008  Malignant neoplasm of female breast (HCC) 10/12/2007   Hypothyroidism 06/18/2007   Allergic rhinitis 06/11/2007      REFERRING PROVIDER:  Wilhelmenia Harada, MD   REFERRING DIAG:  [phone] (ICD-10-CM) - Tendinopathy of left gluteus medius    s/p Lt GMR  Rationale for Evaluation and Treatment: Rehabilitation  THERAPY DIAG:  Pain in left hip  Muscle weakness (generalized)  Difficulty in walking, not elsewhere classified  ONSET DATE: DOS 09/09/23   SUBJECTIVE:                                                                                                                                                                                           SUBJECTIVE STATEMENT:  Pt is 15 weeks  and 3 days s/p L hip gluteus maximus tendon transfers and trochanteric bursectomy.  Pt denies any adverse effects after prior Rx.  Pt reports she was cleaning out her attic space for 9 hrs on Monday.  She had increased pain though felt fine the next day.  Pt performed the standing  hip abd exercise at home though did not perform mini squats.  Pt denies pain currently and states her knee is feeling ok.      PERTINENT HISTORY:  Urge incontinence, chronic Lt hip & knee pain, OP, bilat occipital neuralgia  PAIN:  Are you having pain?  NPRS scale: 0/10 current, 2-3/10 worst  Pain location:  L posterolateral hip, L lateral knee/distal ITB Pain description:  Aggravating factors:  Relieving factors:   PRECAUTIONS:  None  RED FLAGS: None   WEIGHT BEARING RESTRICTIONS:  Yes WBAT  FALLS:  Has patient fallen in last 6 months? No  LIVING ENVIRONMENT: 2 steps to enter home with handrail on Rt when ascending Does not need to go upstairs- bedroom is on the first floor  OCCUPATION:  retired  PLOF:  Independent  PATIENT GOALS:  Walk without pain and limping, stairs, water walking, I have a treadmill at home.    OBJECTIVE:  Note: Objective measures were completed at Evaluation unless otherwise noted.  TREATMENT DATE:   Reviewed pt presentation,  response to prior Rx, and pain level.  Nustep L3 UE/LE's x 5 mins Mini squats 2x10 with bilat UE assist on counter Lateral band walks with RTB around thighs x2 laps at rail Step ups on 6 inch step 2x10 with UE support Hip hiking 2 x 10 bilat with UE support on rail Standing hip abd x 10 bilat, 2x10 reps with YTB x 10 on L Stool rotations x10 each  Pt received L hip flexion, ER, and IR per pt and tissue tolerance in supine    PATIENT EDUCATION:  Education details:  exercise form, relevant anatomy, rationale of  interventions, and HEP.  PT educated pt in performing self STM to ITB.  Person educated: Patient Education method: Explanation, demonstration, verbal cues Education comprehension: verbalized understanding, returned demonstration, verbal cues required  HOME EXERCISE PROGRAM: [email or web address]      ASSESSMENT:  CLINICAL IMPRESSION: Pt is improving with tolerance to activity and mobility.  Pt reports her knee is not bothering her today.  PT is progressing intensity of land based exercises and pt is tolerating exercises well.  She performed exercises per protocol well.  Pt responded well to Rx reporting no pain in hip and having a little pain in knee after Rx.   Pt should benefit from cont skilled PT per protocol to address impairments and goals and to assist in restoring desired level of function.     REHAB POTENTIAL: Good  CLINICAL DECISION MAKING: Stable/uncomplicated  EVALUATION COMPLEXITY: Low   GOALS: Goals reviewed with patient? Yes  SHORT TERM GOALS: Target date: 10/07/23 (4 wks post op)  Able to demo gait with proper pattern for houshold distances without AD Baseline: Goal status: Met - 10/23/23  2.  Hip flexion to 90 deg without pain Baseline:  Goal status:Met- 09/22/23  3.  Good quad and glut activation in CKC Baseline:  Goal status: Met 11/11/23   LONG TERM GOALS: Target date: 6/4/25025  Perform stairs step over step without pain Baseline:  Goal status: 80% MET   4/30  2.  MMT 90% of opposite lower extremity Baseline:  Goal status: INITIAL  3.  Ambulate community distances without pain Baseline:  Goal status:  GOAL MET  4/30  4.  Perform proper pelvic control in squat and SLS Baseline:  Goal status: INITIAL  5.  Pt will report she is able to perform activities in the kitchen with improved ease and tolerance.   Goal status:  INITIAL    PLAN:  PT FREQUENCY: 1-2x/week  PT DURATION:  5 weeks   PLANNED INTERVENTIONS: 97164- PT Re-evaluation,  97110-Therapeutic exercises, 97530- Therapeutic activity, 97112- Neuromuscular re-education, 97535- Self Care, 40981- Manual therapy, [phone]- Gait training, [phone]- Aquatic Therapy, Patient/Family education, Balance training, Stair training, Taping, Dry Needling, Joint mobilization, Spinal mobilization, Scar mobilization, Cryotherapy, and Moist heat.  PLAN FOR NEXT SESSION:  Continue per Dr. Verline Glow glute med repair protocol.  Monitor L knee and ITB pain.    Trina Fujita III PT, DPT 01/01/24 8:36 PM

## 2024-01-01 ENCOUNTER — Other Ambulatory Visit (HOSPITAL_COMMUNITY): Payer: Self-pay

## 2024-01-01 ENCOUNTER — Telehealth: Payer: Self-pay

## 2024-01-01 NOTE — Telephone Encounter (Signed)
 Pt ready for scheduling for PROLIA  on or after : 02/02/24  Option# 1: Buy/Bill (Office supplied medication)  Out-of-pocket cost due at time of clinic visit: $332  Number of injection/visits approved: ---  Primary: [employer] Prolia  co-insurance: 20% Admin fee co-insurance: 0%  Secondary: --- Prolia  co-insurance:  Admin fee co-insurance:   Medical Benefit Details: Date Benefits were checked: 01/01/24 Deductible: NO/ Coinsurance: 20%/ Admin Fee: 0%  Prior Auth: N/A PA# Expiration Date:   # of doses approved: ----------------------------------------------------------------------- Option# 2- Med Obtained from pharmacy:  Pharmacy benefit: Copay $250 (Paid to pharmacy) Admin Fee: 0% (Pay at clinic)  Prior Auth: N/A PA# Expiration Date:   # of doses approved:   If patient wants fill through the pharmacy benefit please send prescription to: [employer], and include estimated need by date in rx notes. Pharmacy will ship medication directly to the office.  Patient NOT eligible for Prolia  Copay Card. Copay Card can make patient's cost as little as $25. Link to apply: [email or web address]  ** This summary of benefits is an estimation of the patient's out-of-pocket cost. Exact cost may very based on individual plan coverage.

## 2024-01-01 NOTE — Telephone Encounter (Signed)
 Prolia  VOB initiated via [email or web address]  Next Prolia  inj DUE: 02/02/24

## 2024-01-02 ENCOUNTER — Ambulatory Visit (HOSPITAL_BASED_OUTPATIENT_CLINIC_OR_DEPARTMENT_OTHER): Admitting: Physical Therapy

## 2024-01-02 ENCOUNTER — Encounter (HOSPITAL_BASED_OUTPATIENT_CLINIC_OR_DEPARTMENT_OTHER): Payer: Self-pay | Admitting: Physical Therapy

## 2024-01-02 ENCOUNTER — Ambulatory Visit

## 2024-01-02 VITALS — Ht 60.0 in | Wt 123.0 lb

## 2024-01-02 DIAGNOSIS — Z0000 Encounter for general adult medical examination without abnormal findings: Secondary | ICD-10-CM

## 2024-01-02 DIAGNOSIS — R262 Difficulty in walking, not elsewhere classified: Secondary | ICD-10-CM

## 2024-01-02 DIAGNOSIS — M25552 Pain in left hip: Secondary | ICD-10-CM | POA: Diagnosis not present

## 2024-01-02 DIAGNOSIS — Z780 Asymptomatic menopausal state: Secondary | ICD-10-CM | POA: Diagnosis not present

## 2024-01-02 DIAGNOSIS — Z0100 Encounter for examination of eyes and vision without abnormal findings: Secondary | ICD-10-CM

## 2024-01-02 DIAGNOSIS — M6281 Muscle weakness (generalized): Secondary | ICD-10-CM

## 2024-01-02 NOTE — Patient Instructions (Signed)
 Ms. Emily [name] , Thank you for taking time out of your busy schedule to complete your Annual Wellness Visit with me. I enjoyed our conversation and look forward to speaking with you again next year. I, as well as your care team,  appreciate your ongoing commitment to your health goals. Please review the following plan we discussed and let me know if I can assist you in the future. Your Game plan/ To Do List    Referrals: If you haven't heard from the office you've been referred to, please reach out to them at the phone provided.  Referral for a DEXA Scan.   Follow up Visits: Next Medicare AWV with our clinical staff: 01/07/2025   Have you seen your provider in the last 6 months (3 months if uncontrolled diabetes)? Yes Next Office Visit with your provider: 01/07/2025  Clinician Recommendations:  Aim for 30 minutes of exercise or brisk walking, 6-8 glasses of water, and 5 servings of fruits and vegetables each day.       This is a list of the screening recommended for you and due dates:  Health Maintenance  Topic Date Due   DEXA scan (bone density measurement)  03/15/2024   Flu Shot  03/19/2024   Medicare Annual Wellness Visit  01/01/2025   DTaP/Tdap/Td vaccine (3 - Td or Tdap) 07/07/2027   Pneumonia Vaccine  Completed   Zoster (Shingles) Vaccine  Completed   HPV Vaccine  Aged Out   Meningitis B Vaccine  Aged Out   COVID-19 Vaccine  Discontinued   Hepatitis C Screening  Discontinued    Advanced directives: (Copy Requested) Please bring a copy of your health care power of attorney and living will to the office to be added to your chart at your convenience. You can mail to [facility] [address] [place], [place] [address] or email to [email or web address] Advance Care Planning is important because it:  [x]  Makes sure you receive the medical care that is consistent with your values, goals, and preferences  [x]  It provides guidance to your family and loved ones and  reduces their decisional burden about whether or not they are making the right decisions based on your wishes.  Follow the link provided in your after visit summary or read over the paperwork we have mailed to you to help you started getting your Advance Directives in place. If you need assistance in completing these, please reach out to us  so that we can help you!

## 2024-01-02 NOTE — Progress Notes (Signed)
 Subjective:   Emily [name] is a 83 y.o. who presents for a Medicare Wellness preventive visit.  As a reminder, Annual Wellness Visits don't include a physical exam, and some assessments may be limited, especially if this visit is performed virtually. We may recommend an in-person follow-up visit with your provider if needed.  Visit Complete: Virtual I connected with  Emily [name] on 01/02/24 by a audio enabled telemedicine application and verified that I am speaking with the correct person using two identifiers.  Patient Location: Home  Provider Location: Office/Clinic  I discussed the limitations of evaluation and management by telemedicine. The patient expressed understanding and agreed to proceed.  Vital Signs: Because this visit was a virtual/telehealth visit, some criteria may be missing or patient reported. Any vitals not documented were not able to be obtained and vitals that have been documented are patient reported.  VideoDeclined- This patient declined Librarian, academic. Therefore the visit was completed with audio only.  Persons Participating in Visit: Patient.  AWV Questionnaire: No: Patient Medicare AWV questionnaire was not completed prior to this visit.  Cardiac Risk Factors include: advanced age (>43men, >81 women);dyslipidemia     Objective:     Today's Vitals   01/02/24 1013  Weight: 123 lb (55.8 kg)  Height: 5' (1.524 m)   Body mass index is 24.02 kg/m.     01/02/2024   10:12 AM 09/11/2023   11:49 AM 09/09/2023    6:26 AM 07/10/2023    8:13 AM 04/11/2023   11:12 AM 03/02/2023    4:47 PM 02/06/2023   10:27 AM  Advanced Directives  Does Patient Have a Medical Advance Directive? Yes Yes Yes No No Yes Yes  Type of Estate agent of [place];Living will Healthcare Power of [place];Living will    Healthcare Power of [place];Living will Healthcare Power of [place];Living will  Does patient want to  make changes to medical advance directive?   No - Patient declined      Copy of Healthcare Power of Attorney in Chart? No - copy requested     No - copy requested No - copy requested    Current Medications (verified) Outpatient Encounter Medications as of 01/02/2024  Medication Sig   atenolol  (TENORMIN ) 25 MG tablet TAKE 1 TABLET BY MOUTH EVERY DAY   Calcium Carb-Cholecalciferol (CALCIUM 600 + D PO) Take by mouth.   chlorpheniramine  (CHLOR-TRIMETON ) 4 MG tablet Take 1 tablet (4 mg total) by mouth 2 (two) times daily as needed for allergies.   cholecalciferol (VITAMIN D ) 25 MCG (1000 UNIT) tablet Take 1,000 Units by mouth daily.   EPINEPHrine  0.3 mg/0.3 mL IJ SOAJ injection Inject 0.3 mg into the muscle as needed for anaphylaxis.   gabapentin  (NEURONTIN ) 100 MG capsule TAKE 1 CAPSULE (100 MG TOTAL) BY MOUTH AT BEDTIME. TAKE 1 TO 3 CAPSULES AT BEDTIME   ipratropium (ATROVENT ) 0.03 % nasal spray SPRAY 2 SPRAYS INTO EACH NOSTRIL EVERY 12 HOURS   levothyroxine  (SYNTHROID ) 75 MCG tablet TAKE 1 TABLET BY MOUTH EVERY DAY 6 DAYS A WEEK AND 1.5 TABLET PO ONE DAY A WEEK   Facility-Administered Encounter Medications as of 01/02/2024  Medication   [START ON 02/02/2024] denosumab  (PROLIA ) injection 60 mg    Allergies (verified) Covid-19 (mrna) vaccine, Molnupiravir , Allegra [fexofenadine], Levaquin  [levofloxacin ], Nexium  [esomeprazole  magnesium ], Prednisone , and Valerian root   History: Past Medical History:  Diagnosis Date   Actinic keratosis    Allergic rhinitis    BACK PAIN  CARCINOMA, BREAST, 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   L s/p lumpectomy, chemo and XRT   Cervicalgia    Cholelithiasis    DIVERTICULOSIS, COLON    ENDOMETRIOSIS    Heart palpitations    HYPOTHYROIDISM    Left wrist fracture 11/06/2016   10/2016 - fell of high stool   Osteoporosis    Palpitations    Personal history of chemotherapy    Personal history of radiation therapy    Pleural plaque without asbestos    CT  chest 10/2010: R>L lobes, upper> lower   Renal disorder    Squamous cell carcinoma    History of BC   Past Surgical History:  Procedure Laterality Date   BREAST LUMPECTOMY  2001   left   CATARACT EXTRACTION Bilateral 09/2014   both eyes   CHOLECYSTECTOMY  2010   ENDOMETRIAL ABLATION     EXCISION / BIOPSY BREAST / NIPPLE / DUCT Left 2001   GLUTEUS MINIMUS REPAIR Left 09/09/2023   Procedure: LEFT GLUTEUS MAXIMUS TENDON TRANSFER REPAIR;  Surgeon: Wilhelmenia Harada, MD;  Location: [facility];  Service: Orthopedics;  Laterality: Left;   Family History  Problem Relation Age of Onset   Dementia Mother    Heart disease Father    Emphysema Father        smoked and worked with asbestos   Colon cancer Neg Hx    Esophageal cancer Neg Hx    Rectal cancer Neg Hx    Migraines Neg Hx    Headache Neg Hx    Social History   Socioeconomic History   Marital status: Married    Spouse name: Not on file   Number of children: 2   Years of education: Not on file   Highest education level: Not on file  Occupational History   Occupation: Retired    Comment: Advertising account executive  Tobacco Use   Smoking status: Never    Passive exposure: Never   Smokeless tobacco: Never  Vaping Use   Vaping status: Never Used  Substance and Sexual Activity   Alcohol use: Not Currently   Drug use: No   Sexual activity: Not Currently    Birth control/protection: Post-menopausal  Other Topics Concern   Not on file  Social History Narrative   Married, lives in [place] area since 2008 from [place]  to be close to dtr   Social Drivers of Health   Financial Resource Strain: Low Risk  (01/02/2024)   Overall Financial Resource Strain (CARDIA)    Difficulty of Paying Living Expenses: Not hard at all  Food Insecurity: No Food Insecurity (01/02/2024)   Hunger Vital Sign    Worried About Running Out of Food in the Last Year: Never true    Ran Out of Food in the Last Year: Never true  Transportation Needs: No  Transportation Needs (01/02/2024)   PRAPARE - Administrator, Civil Service (Medical): No    Lack of Transportation (Non-Medical): No  Physical Activity: Insufficiently Active (01/02/2024)   Exercise Vital Sign    Days of Exercise per Week: 2 days    Minutes of Exercise per Session: 40 min  Stress: No Stress Concern Present (01/02/2024)   [employer] of Occupational Health - Occupational Stress Questionnaire    Feeling of Stress : Not at all  Social Connections: Socially Integrated (01/02/2024)   Social Connection and Isolation Panel [NHANES]    Frequency of Communication with Friends and Family: More than three times a week  Frequency of Social Gatherings with Friends and Family: More than three times a week    Attends Religious Services: More than 4 times per year    Active Member of [employer] or Organizations: Yes    Attends Engineer, structural: More than 4 times per year    Marital Status: Married    Tobacco Counseling Counseling given: No    Clinical Intake:  Pre-visit preparation completed: Yes  Pain : No/denies pain     BMI - recorded: 24.02 Nutritional Status: BMI of 19-24  Normal Nutritional Risks: None Diabetes: No  Lab Results  Component Value Date   HGBA1C 6.1 10/31/2023   HGBA1C 6.2 10/28/2022   HGBA1C 5.9 09/27/2021     How often do you need to have someone help you when you read instructions, pamphlets, or other written materials from your doctor or pharmacy?: 1 - Never  Interpreter Needed?: No  Information entered by :: Kandy Orris, CMA   Activities of Daily Living     01/02/2024   10:18 AM 09/09/2023    6:30 AM  In your present state of health, do you have any difficulty performing the following activities:  Hearing? 0 0  Vision? 0 0  Difficulty concentrating or making decisions? 0 0  Walking or climbing stairs? 0   Dressing or bathing? 0   Doing errands, shopping? 0   Preparing Food and eating ? N   Using the  Toilet? N   In the past six months, have you accidently leaked urine? Y   Comment wears a pad   Do you have problems with loss of bowel control? N   Managing your Medications? N   Managing your Finances? N   Housekeeping or managing your Housekeeping? N     Patient Care Team: Colene Dauphin, MD as PCP - General (Internal Medicine) Sheryle Donning, MD as PCP - Cardiology (Cardiology) Ivery Marking, MD as Consulting Physician (Obstetrics and Gynecology) Adalberto Hollow, MD as Consulting Physician (General Surgery) Diamond Formica, MD as Consulting Physician (Pulmonary Disease) Sumner Ends, MD as Consulting Physician (Internal Medicine) Osa Blase, MD (Orthopedic Surgery) Sheryle Donning, MD as Consulting Physician (Cardiology)  Indicate any recent Medical Services you may have received from other than [facility] providers in the past year (date may be approximate).     Assessment:    This is a routine wellness examination for Emily [name].  Hearing/Vision screen Hearing Screening - Comments:: Wears hearing aids Vision Screening - Comments:: Referral to an Opthalmologist   Goals Addressed               This Visit's Progress     Patient Stated (pt-stated)        Patient stated she is currently doing physical therapy for his (torn left tendon) and plans to heal.         Depression Screen     01/02/2024   10:20 AM 10/31/2023    8:45 AM 02/06/2023   11:08 AM 01/01/2023    3:58 PM 10/30/2022    9:52 AM 08/21/2022    8:18 AM 03/25/2022   10:18 AM  PHQ 2/9 Scores  PHQ - 2 Score 0 0 4 4 0 0 0  PHQ- 9 Score 1  6 6  0 2 2    Fall Risk     01/02/2024   10:19 AM 10/31/2023    8:44 AM 07/09/2023    9:12 AM 02/06/2023   10:17 AM 01/01/2023    3:39 PM  Fall Risk   Falls in the past year? 0 0 0 0 0  Number falls in past yr: 0 0 0 0 0  Injury with Fall? 0 0 0 0 0  Risk for fall due to : No Fall Risks No Fall Risks No Fall Risks No Fall Risks No Fall Risks  Follow  up Falls prevention discussed;Falls evaluation completed Falls evaluation completed Falls evaluation completed Falls prevention discussed Falls evaluation completed    MEDICARE RISK AT HOME:  Medicare Risk at Home Any stairs in or around the home?: Yes If so, are there any without handrails?: No Home free of loose throw rugs in walkways, pet beds, electrical cords, etc?: Yes Adequate lighting in your home to reduce risk of falls?: Yes Life alert?: No Use of a cane, walker or w/c?: No Grab bars in the bathroom?: Yes Shower chair or bench in shower?: Yes Elevated toilet seat or a handicapped toilet?: Yes  TIMED UP AND GO:  Was the test performed?  No  Cognitive Function: 6CIT completed    04/20/2015   10:56 AM  MMSE - Mini Mental State Exam  Not completed: --        01/02/2024   10:22 AM 02/06/2023   11:10 AM 01/22/2022    9:48 AM  6CIT Screen  What Year? 0 points 0 points 0 points  What month? 0 points 0 points 0 points  What time? 0 points 0 points 0 points  Count back from 20 0 points 0 points 0 points  Months in reverse 0 points 0 points 0 points  Repeat phrase 0 points 0 points 0 points  Total Score 0 points 0 points 0 points    Immunizations Immunization History  Administered Date(s) Administered    Astrazeneca Covid-19 Vaccine, Pf, 0.5 Ml Non Us   09/29/2019   Fluad Quad(high Dose 65+) 06/09/2019, 06/20/2022   Fluad Trivalent(High Dose 65+) 05/21/2023   Influenza Split 05/04/2010, 05/20/2011, 06/10/2012   Influenza Whole 06/18/2007   Influenza, High Dose Seasonal PF 06/07/2013, 04/20/2015, 07/25/2016, 06/19/2017, 07/10/2018, 06/22/2020   Influenza,inj,Quad PF,6+ Mos 05/31/2014   Influenza-Unspecified 06/08/2021   PFIZER(Purple Top)SARS-COV-2 Vaccination 09/08/2019, 09/29/2019, 05/12/2020   Pneumococcal Conjugate-13 07/28/2014   Pneumococcal Polysaccharide-23 06/18/2007, 08/19/2014   Td 06/18/2007   Td (Adult), 2 Lf Tetanus Toxid, Preservative Free 05/20/2007    Tdap 07/06/2017   Unspecified SARS-COV-2 Vaccination 09/29/2019   Zoster Recombinant(Shingrix) 03/24/2020, 09/22/2020   Zoster, Live 11/12/2011, 08/19/2014    Screening Tests Health Maintenance  Topic Date Due   DEXA SCAN  03/15/2024   INFLUENZA VACCINE  03/19/2024   Medicare Annual Wellness (AWV)  01/01/2025   DTaP/Tdap/Td (3 - Td or Tdap) 07/07/2027   Pneumonia Vaccine 10+ Years old  Completed   Zoster Vaccines- Shingrix  Completed   HPV VACCINES  Aged Out   Meningococcal B Vaccine  Aged Out   COVID-19 Vaccine  Discontinued   Hepatitis C Screening  Discontinued    Health Maintenance  There are no preventive care reminders to display for this patient. Health Maintenance Items Addressed:  DEXA ordered today; Referral to [facility] Ophthalmologist for a routine eye exam.  Additional Screening:  Vision Screening: Recommended annual ophthalmology exams for early detection of glaucoma and other disorders of the eye.  Dental Screening: Recommended annual dental exams for proper oral hygiene  Community Resource Referral / Chronic Care Management: CRR required this visit?  No   CCM required this visit?  No   Plan:    I have  personally reviewed and noted the following in the patient's chart:   Medical and social history Use of alcohol, tobacco or illicit drugs  Current medications and supplements including opioid prescriptions. Patient is not currently taking opioid prescriptions. Functional ability and status Nutritional status Physical activity Advanced directives List of other physicians Hospitalizations, surgeries, and ER visits in previous 12 months Vitals Screenings to include cognitive, depression, and falls Referrals and appointments  In addition, I have reviewed and discussed with patient certain preventive protocols, quality metrics, and best practice recommendations. A written personalized care plan for preventive services as well as general preventive  health recommendations were provided to patient.   Patria Bookbinder, CMA   01/02/2024   After Visit Summary: (MyChart) Due to this being a telephonic visit, the after visit summary with patients personalized plan was offered to patient via MyChart   Notes: Nothing significant to report at this time.

## 2024-01-02 NOTE — Therapy (Signed)
 OUTPATIENT PHYSICAL THERAPY TREATMENT      Patient Name: Emily [name] MRN: [id number] DOB:February 02, 1941, 83 y.o., female Today's Date: 01/02/2024  END OF SESSION:  PT End of Session - 01/02/24 1114     Visit Number 25    Number of Visits 28    Date for PT Re-Evaluation 01/21/24    Authorization Type HTA, no VL    PT Start Time 1112    PT Stop Time 1151    PT Time Calculation (min) 39 min    Activity Tolerance Patient tolerated treatment well    Behavior During Therapy WFL for tasks assessed/performed                       Past Medical History:  Diagnosis Date   Actinic keratosis    Allergic rhinitis    BACK PAIN    CARCINOMA, BREAST, 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   L s/p lumpectomy, chemo and XRT   Cervicalgia    Cholelithiasis    DIVERTICULOSIS, COLON    ENDOMETRIOSIS    Heart palpitations    HYPOTHYROIDISM    Left wrist fracture 11/06/2016   10/2016 - fell of high stool   Osteoporosis    Palpitations    Personal history of chemotherapy    Personal history of radiation therapy    Pleural plaque without asbestos    CT chest 10/2010: R>L lobes, upper> lower   Renal disorder    Squamous cell carcinoma    History of BC   Past Surgical History:  Procedure Laterality Date   BREAST LUMPECTOMY  2001   left   CATARACT EXTRACTION Bilateral 09/2014   both eyes   CHOLECYSTECTOMY  2010   ENDOMETRIAL ABLATION     EXCISION / BIOPSY BREAST / NIPPLE / DUCT Left 2001   GLUTEUS MINIMUS REPAIR Left 09/09/2023   Procedure: LEFT GLUTEUS MAXIMUS TENDON TRANSFER REPAIR;  Surgeon: Wilhelmenia Harada, MD;  Location:  [facility];  Service: Orthopedics;  Laterality: Left;   Patient Active Problem List   Diagnosis Date Noted   RLS (restless legs syndrome) 10/31/2023   Vitamin D  deficiency 10/31/2023   Tendinopathy of left gluteus medius 09/09/2023   Acute cystitis 04/16/2023   Change in bowel habits 01/15/2023   Anxiety 01/01/2023   Agatston  coronary artery calcium score 22.4  (12/2021) 10/30/2022   Urge incontinence of urine 10/30/2022   Word finding difficulty 10/30/2022   Chronic pain of left knee 08/21/2022   Lumbar radiculopathy 03/25/2022   Cough 12/27/2021   Sleep difficulties 09/27/2021   Cervicogenic headache 04/10/2021   Skin abnormality 02/27/2021   Aortic atherosclerosis (HCC) 09/22/2020   Urinary urgency 03/23/2020   CKD (chronic kidney disease) stage 3, GFR 30-59 ml/min (HCC) 03/22/2020   Prediabetes 03/22/2020   Change in stool 11/17/2019   Right-sided chest wall pain 11/17/2019   Urinary retention 09/23/2019   SOB (shortness of breath) 01/15/2019   Upper airway cough syndrome 01/15/2019   Reactive airway disease 12/05/2018   Tear of left rotator cuff 10/05/2018   Pre-syncope 05/19/2018   DDD (degenerative disc disease), cervical 01/15/2018   Neuralgia 09/12/2017   Presbycusis of both ears 12/11/2016   Memory difficulties 11/06/2016   IBS (irritable bowel syndrome) 04/16/2016   Hyperlipidemia 09/10/2015   Osteoporosis 09/08/2015   Palpitations    Pleural plaque without asbestos 12/03/2010   Chronic neck pain 12/18/2009   Diverticulosis of colon 03/02/2009   Lumbago 03/11/2008   Actinic keratosis 01/23/2008  Malignant neoplasm of female breast (HCC) 10/12/2007   Hypothyroidism 06/18/2007   Allergic rhinitis 06/11/2007      REFERRING PROVIDER:  Wilhelmenia Harada, MD   REFERRING DIAG:  [phone] (ICD-10-CM) - Tendinopathy of left gluteus medius    s/p Lt GMR  Rationale for Evaluation and Treatment: Rehabilitation  THERAPY DIAG:  Pain in left hip  Muscle weakness (generalized)  Difficulty in walking, not elsewhere classified  ONSET DATE: DOS 09/09/23   SUBJECTIVE:                                                                                                                                                                                           SUBJECTIVE STATEMENT:  Pt is 16 weeks  and 3 days s/p L hip gluteus maximus tendon transfers and trochanteric bursectomy.  Pt denies any adverse effects after prior Rx.  Pt states she did some squats at home yesterday.  Pt denies pain currently.      PERTINENT HISTORY:  Urge incontinence, chronic Lt hip & knee pain, OP, bilat occipital neuralgia  PAIN:  Are you having pain?  NPRS scale: 0/10 current, 2-3/10 worst  Pain location:  L posterolateral hip, L lateral knee/distal ITB Pain description:  Aggravating factors:  Relieving factors:   PRECAUTIONS:  None  RED FLAGS: None   WEIGHT BEARING RESTRICTIONS:  Yes WBAT  FALLS:  Has patient fallen in last 6 months? No  LIVING ENVIRONMENT: 2 steps to enter home with handrail on Rt when ascending Does not need to go upstairs- bedroom is on the first floor  OCCUPATION:  retired  PLOF:  Independent  PATIENT GOALS:  Walk without pain and limping, stairs, water walking, I have a treadmill at home.    OBJECTIVE:  Note: Objective measures were completed at Evaluation unless otherwise noted.                                                                                                                              TREATMENT DATE:   Reviewed pt presentation,  response to prior Rx, and pain level.  Nustep L3-4 UE/LE's x 5 mins Mini squats 2x10 with bilat UE assist on counter Lateral band walks with RTB around thighs x2 laps at rail Step ups on 6 inch step 2x10 with UE support Hip hiking 2 x 10 bilat with UE support on rail Standing hip abd 2x10 reps L LE, 1x 10 R LE with YTB Cybex leg press 30# 1x10  Pt received L hip flexion, ER, and IR per pt and tissue tolerance in supine   PATIENT EDUCATION:  Education details:  exercise form, relevant anatomy, rationale of interventions, POC, and HEP.  Person educated: Patient Education method: Explanation, demonstration, verbal cues Education comprehension: verbalized understanding, returned demonstration, verbal cues  required  HOME EXERCISE PROGRAM: [id number]    ASSESSMENT:  CLINICAL IMPRESSION: Pt is progressing well with land based exercises.  She presents to Rx having no pain in hip and knee.  She performed exercises per protocol well.  She has good form with mini squats.  Pt reports having some back soreness with standing exercises.  PT added cybex leg press and pt had no pain in LE though was concerned about her back.  Pt only performed 1 set.  She responded well to Rx having no pain and no c/o's after Rx.   Pt should benefit from cont skilled PT per protocol to address impairments and goals and to assist in restoring desired level of function.     REHAB POTENTIAL: Good  CLINICAL DECISION MAKING: Stable/uncomplicated  EVALUATION COMPLEXITY: Low   GOALS: Goals reviewed with patient? Yes  SHORT TERM GOALS: Target date: 10/07/23 (4 wks post op)  Able to demo gait with proper pattern for houshold distances without AD Baseline: Goal status: Met - 10/23/23  2.  Hip flexion to 90 deg without pain Baseline:  Goal status:Met- 09/22/23  3.  Good quad and glut activation in CKC Baseline:  Goal status: Met 11/11/23   LONG TERM GOALS: Target date: 6/4/25025  Perform stairs step over step without pain Baseline:  Goal status: 80% MET   4/30  2.  MMT 90% of opposite lower extremity Baseline:  Goal status: INITIAL  3.  Ambulate community distances without pain Baseline:  Goal status:  GOAL MET  4/30  4.  Perform proper pelvic control in squat and SLS Baseline:  Goal status: INITIAL  5.  Pt will report she is able to perform activities in the kitchen with improved ease and tolerance.   Goal status:  INITIAL    PLAN:  PT FREQUENCY: 1-2x/week  PT DURATION:  5 weeks   PLANNED INTERVENTIONS: 97164- PT Re-evaluation, 97110-Therapeutic exercises, 97530- Therapeutic activity, 97112- Neuromuscular re-education, 97535- Self Care, 16109- Manual therapy, [phone]- Gait training, [phone]- Aquatic  Therapy, Patient/Family education, Balance training, Stair training, Taping, Dry Needling, Joint mobilization, Spinal mobilization, Scar mobilization, Cryotherapy, and Moist heat.  PLAN FOR NEXT SESSION:  Continue per Dr. Verline Glow glute med repair protocol.  Monitor L knee and ITB pain.    Trina Fujita III PT, DPT 01/02/24 12:08 PM

## 2024-01-05 ENCOUNTER — Ambulatory Visit: Admitting: Internal Medicine

## 2024-01-05 ENCOUNTER — Encounter (HOSPITAL_BASED_OUTPATIENT_CLINIC_OR_DEPARTMENT_OTHER): Payer: Self-pay | Admitting: Physical Therapy

## 2024-01-05 ENCOUNTER — Ambulatory Visit (HOSPITAL_BASED_OUTPATIENT_CLINIC_OR_DEPARTMENT_OTHER): Payer: Self-pay | Admitting: Physical Therapy

## 2024-01-05 DIAGNOSIS — M6281 Muscle weakness (generalized): Secondary | ICD-10-CM

## 2024-01-05 DIAGNOSIS — M25552 Pain in left hip: Secondary | ICD-10-CM

## 2024-01-05 DIAGNOSIS — R262 Difficulty in walking, not elsewhere classified: Secondary | ICD-10-CM

## 2024-01-05 NOTE — Therapy (Signed)
 OUTPATIENT PHYSICAL THERAPY TREATMENT      Patient Name: Emily [name] MRN: [id number] DOB:Feb 27, 1941, 83 y.o., female Today's Date: 01/05/2024  END OF SESSION:  PT End of Session - 01/05/24 0849     Visit Number 26    Number of Visits 28    Date for PT Re-Evaluation 01/21/24    Authorization Type HTA, no VL    PT Start Time 0803    PT Stop Time 0846    PT Time Calculation (min) 43 min    Activity Tolerance Patient tolerated treatment well    Behavior During Therapy WFL for tasks assessed/performed                        Past Medical History:  Diagnosis Date   Actinic keratosis    Allergic rhinitis    BACK PAIN    CARCINOMA, BREAST, 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   L s/p lumpectomy, chemo and XRT   Cervicalgia    Cholelithiasis    DIVERTICULOSIS, COLON    ENDOMETRIOSIS    Heart palpitations    HYPOTHYROIDISM    Left wrist fracture 11/06/2016   10/2016 - fell of high stool   Osteoporosis    Palpitations    Personal history of chemotherapy    Personal history of radiation therapy    Pleural plaque without asbestos    CT chest 10/2010: R>L lobes, upper> lower   Renal disorder    Squamous cell carcinoma    History of BC   Past Surgical History:  Procedure Laterality Date   BREAST LUMPECTOMY  2001   left   CATARACT EXTRACTION Bilateral 09/2014   both eyes   CHOLECYSTECTOMY  2010   ENDOMETRIAL ABLATION     EXCISION / BIOPSY BREAST / NIPPLE / DUCT Left 2001   GLUTEUS MINIMUS REPAIR Left 09/09/2023   Procedure: LEFT GLUTEUS MAXIMUS TENDON TRANSFER REPAIR;  Surgeon: Wilhelmenia Harada, MD;  Location: [facility];  Service: Orthopedics;  Laterality: Left;   Patient Active Problem List   Diagnosis Date Noted   RLS (restless legs syndrome) 10/31/2023   Vitamin D  deficiency 10/31/2023   Tendinopathy of left gluteus medius 09/09/2023   Acute cystitis 04/16/2023   Change in bowel habits 01/15/2023   Anxiety 01/01/2023   Agatston  coronary artery calcium score 22.4  (12/2021) 10/30/2022   Urge incontinence of urine 10/30/2022   Word finding difficulty 10/30/2022   Chronic pain of left knee 08/21/2022   Lumbar radiculopathy 03/25/2022   Cough 12/27/2021   Sleep difficulties 09/27/2021   Cervicogenic headache 04/10/2021   Skin abnormality 02/27/2021   Aortic atherosclerosis (HCC) 09/22/2020   Urinary urgency 03/23/2020   CKD (chronic kidney disease) stage 3, GFR 30-59 ml/min (HCC) 03/22/2020   Prediabetes 03/22/2020   Change in stool 11/17/2019   Right-sided chest wall pain 11/17/2019   Urinary retention 09/23/2019   SOB (shortness of breath) 01/15/2019   Upper airway cough syndrome 01/15/2019   Reactive airway disease 12/05/2018   Tear of left rotator cuff 10/05/2018   Pre-syncope 05/19/2018   DDD (degenerative disc disease), cervical 01/15/2018   Neuralgia 09/12/2017   Presbycusis of both ears 12/11/2016   Memory difficulties 11/06/2016   IBS (irritable bowel syndrome) 04/16/2016   Hyperlipidemia 09/10/2015   Osteoporosis 09/08/2015   Palpitations    Pleural plaque without asbestos 12/03/2010   Chronic neck pain 12/18/2009   Diverticulosis of colon 03/02/2009   Lumbago 03/11/2008   Actinic keratosis 01/23/2008  Malignant neoplasm of female breast (HCC) 10/12/2007   Hypothyroidism 06/18/2007   Allergic rhinitis 06/11/2007      REFERRING PROVIDER:  Wilhelmenia Harada, MD   REFERRING DIAG:  [phone] (ICD-10-CM) - Tendinopathy of left gluteus medius    s/p Lt GMR  Rationale for Evaluation and Treatment: Rehabilitation  THERAPY DIAG:  Pain in left hip  Muscle weakness (generalized)  Difficulty in walking, not elsewhere classified  ONSET DATE: DOS 09/09/23   SUBJECTIVE:                                                                                                                                                                                           SUBJECTIVE STATEMENT:  Pt is 16 weeks  and 6 days s/p L hip gluteus maximus tendon transfers and trochanteric bursectomy.  Pt states she had back pain which focused more in her central and lower L sided lumbar after prior Rx.  Pt reports her back bothered her all weekend.  Pt reports tylenol  helping.  Pt states she didn't want to perform the leg press.  Pt denies hip pain and states her back pain is very mild currently.  Pt reports improved tolerance and ease with cooking and kitchen activities and is able to perform those activities without significant pain. Pt did some work in the yard on Saturday. Pt reports having difficulty with bending to clean behind toilet.      PERTINENT HISTORY:  Urge incontinence, chronic Lt hip & knee pain, OP, bilat occipital neuralgia  PAIN:  Are you having pain?  NPRS scale: 0/10 current, 2-3/10 worst  Pain location:  L posterolateral hip, L lateral knee/distal ITB Pain description:  Aggravating factors:  Relieving factors:   PRECAUTIONS:  None  RED FLAGS: None   WEIGHT BEARING RESTRICTIONS:  Yes WBAT  FALLS:  Has patient fallen in last 6 months? No  LIVING ENVIRONMENT: 2 steps to enter home with handrail on Rt when ascending Does not need to go upstairs- bedroom is on the first floor  OCCUPATION:  retired  PLOF:  Independent  PATIENT GOALS:  Walk without pain and limping, stairs, water walking, I have a treadmill at home.    OBJECTIVE:  Note: Objective measures were completed at Evaluation unless otherwise noted.  TREATMENT DATE:   Reviewed pt presentation, response to prior Rx, and pain level.  Nustep L3-4 UE/LE's x 5 mins Supine bridge 2x10 S/L hip abduction 2x5 with pillow b/w knees Mini squats 2x10 with bilat UE assist on counter Lateral band walks with RTB around thighs x3 laps at wall Step ups on 6 inch step x10 with UE support Lateral  step ups 4 inch x 10, 6 inch x 10 Hip hiking 2 x 10 bilat with UE support on rail Supine manual modified thomas stretch 2x30 sec  Reviewed HEP.   PATIENT EDUCATION:  Education details:  exercise form, relevant anatomy, rationale of interventions, POC, and HEP.  Person educated: Patient Education method: Explanation, demonstration, verbal cues Education comprehension: verbalized understanding, returned demonstration, verbal cues required  HOME EXERCISE PROGRAM: [email or web address]    ASSESSMENT:  CLINICAL IMPRESSION: Pt demonstrates improved tolerance to activity as evidenced by subjective reports.  Pt reports improved tolerance and ease with cooking and kitchen activities and is able to perform those activities without significant pain.  Pt met LTG #5.  She is improving with strength and performed exercises per protocol well without c/o's.  She was able to perform S/L hip abd and did fatigue with S/L hip abduction.  Pt responded well to Rx having no pain after Rx.     REHAB POTENTIAL: Good  CLINICAL DECISION MAKING: Stable/uncomplicated  EVALUATION COMPLEXITY: Low   GOALS: Goals reviewed with patient? Yes  SHORT TERM GOALS: Target date: 10/07/23 (4 wks post op)  Able to demo gait with proper pattern for houshold distances without AD Baseline: Goal status: Met - 10/23/23  2.  Hip flexion to 90 deg without pain Baseline:  Goal status:Met- 09/22/23  3.  Good quad and glut activation in CKC Baseline:  Goal status: Met 11/11/23   LONG TERM GOALS: Target date: 6/4/25025  Perform stairs step over step without pain Baseline:  Goal status: 80% MET   4/30  2.  MMT 90% of opposite lower extremity Baseline:  Goal status: INITIAL  3.  Ambulate community distances without pain Baseline:  Goal status:  GOAL MET  4/30  4.  Perform proper pelvic control in squat and SLS Baseline:  Goal status: INITIAL  5.  Pt will report she is able to perform activities in the kitchen with improved  ease and tolerance.   Goal status:  GOAL MET  5/19    PLAN:  PT FREQUENCY: 1-2x/week  PT DURATION:  5 weeks   PLANNED INTERVENTIONS: 97164- PT Re-evaluation, 97110-Therapeutic exercises, 97530- Therapeutic activity, 97112- Neuromuscular re-education, 97535- Self Care, 52841- Manual therapy, [phone]- Gait training, [phone]- Aquatic Therapy, Patient/Family education, Balance training, Stair training, Taping, Dry Needling, Joint mobilization, Spinal mobilization, Scar mobilization, Cryotherapy, and Moist heat.  PLAN FOR NEXT SESSION:  Continue per Dr. Verline Glow glute med repair protocol.  Monitor L knee and ITB pain.  Check ROM and strength next visit.    Trina Fujita III PT, DPT 01/06/24 11:04 AM

## 2024-01-15 ENCOUNTER — Ambulatory Visit (HOSPITAL_BASED_OUTPATIENT_CLINIC_OR_DEPARTMENT_OTHER): Payer: Self-pay | Admitting: Physical Therapy

## 2024-01-15 DIAGNOSIS — M6281 Muscle weakness (generalized): Secondary | ICD-10-CM

## 2024-01-15 DIAGNOSIS — R262 Difficulty in walking, not elsewhere classified: Secondary | ICD-10-CM

## 2024-01-15 DIAGNOSIS — M25552 Pain in left hip: Secondary | ICD-10-CM | POA: Diagnosis not present

## 2024-01-15 NOTE — Therapy (Signed)
 OUTPATIENT PHYSICAL THERAPY TREATMENT      Patient Name: Emily [name] MRN: [id number] DOB:Apr 27, 1941, 83 y.o., female Today's Date: 01/16/2024  END OF SESSION:  PT End of Session - 01/15/24 1112     Visit Number 27    Number of Visits 28    Date for PT Re-Evaluation 01/21/24    Authorization Type HTA, no VL    PT Start Time 1110    PT Stop Time 1154    PT Time Calculation (min) 44 min    Activity Tolerance Patient tolerated treatment well    Behavior During Therapy WFL for tasks assessed/performed                        Past Medical History:  Diagnosis Date   Actinic keratosis    Allergic rhinitis    BACK PAIN    CARCINOMA, BREAST, 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   L s/p lumpectomy, chemo and XRT   Cervicalgia    Cholelithiasis    DIVERTICULOSIS, COLON    ENDOMETRIOSIS    Heart palpitations    HYPOTHYROIDISM    Left wrist fracture 11/06/2016   10/2016 - fell of high stool   Osteoporosis    Palpitations    Personal history of chemotherapy    Personal history of radiation therapy    Pleural plaque without asbestos    CT chest 10/2010: R>L lobes, upper> lower   Renal disorder    Squamous cell carcinoma    History of BC   Past Surgical History:  Procedure Laterality Date   BREAST LUMPECTOMY  2001   left   CATARACT EXTRACTION Bilateral 09/2014   both eyes   CHOLECYSTECTOMY  2010   ENDOMETRIAL ABLATION     EXCISION / BIOPSY BREAST / NIPPLE / DUCT Left 2001   GLUTEUS MINIMUS REPAIR Left 09/09/2023   Procedure: LEFT GLUTEUS MAXIMUS TENDON TRANSFER REPAIR;  Surgeon: Wilhelmenia Harada, MD;  Location: [facility];  Service: Orthopedics;  Laterality: Left;   Patient Active Problem List   Diagnosis Date Noted   RLS (restless legs syndrome) 10/31/2023   Vitamin D  deficiency 10/31/2023   Tendinopathy of left gluteus medius 09/09/2023   Acute cystitis 04/16/2023   Change in bowel habits 01/15/2023   Anxiety 01/01/2023   Agatston  coronary artery calcium score 22.4  (12/2021) 10/30/2022   Urge incontinence of urine 10/30/2022   Word finding difficulty 10/30/2022   Chronic pain of left knee 08/21/2022   Lumbar radiculopathy 03/25/2022   Cough 12/27/2021   Sleep difficulties 09/27/2021   Cervicogenic headache 04/10/2021   Skin abnormality 02/27/2021   Aortic atherosclerosis (HCC) 09/22/2020   Urinary urgency 03/23/2020   CKD (chronic kidney disease) stage 3, GFR 30-59 ml/min (HCC) 03/22/2020   Prediabetes 03/22/2020   Change in stool 11/17/2019   Right-sided chest wall pain 11/17/2019   Urinary retention 09/23/2019   SOB (shortness of breath) 01/15/2019   Upper airway cough syndrome 01/15/2019   Reactive airway disease 12/05/2018   Tear of left rotator cuff 10/05/2018   Pre-syncope 05/19/2018   DDD (degenerative disc disease), cervical 01/15/2018   Neuralgia 09/12/2017   Presbycusis of both ears 12/11/2016   Memory difficulties 11/06/2016   IBS (irritable bowel syndrome) 04/16/2016   Hyperlipidemia 09/10/2015   Osteoporosis 09/08/2015   Palpitations    Pleural plaque without asbestos 12/03/2010   Chronic neck pain 12/18/2009   Diverticulosis of colon 03/02/2009   Lumbago 03/11/2008   Actinic keratosis 01/23/2008  Malignant neoplasm of female breast (HCC) 10/12/2007   Hypothyroidism 06/18/2007   Allergic rhinitis 06/11/2007      REFERRING PROVIDER:  Wilhelmenia Harada, MD   REFERRING DIAG:  [phone] (ICD-10-CM) - Tendinopathy of left gluteus medius    s/p Lt GMR  Rationale for Evaluation and Treatment: Rehabilitation  THERAPY DIAG:  Pain in left hip  Muscle weakness (generalized)  Difficulty in walking, not elsewhere classified  ONSET DATE: DOS 09/09/23   SUBJECTIVE:                                                                                                                                                                                           SUBJECTIVE STATEMENT:  Pt is 18 weeks  and 2 days s/p L hip gluteus maximus tendon transfers and trochanteric bursectomy.  Pt denies any adverse effects after prior Rx.  Pt denies pain currently.  Pt states she has been performing her HEP 2-3x/wk.  Pt reports having some pain with bridging.  Pt reports she has been performing a lot of stairs in her home.       PERTINENT HISTORY:  Urge incontinence, chronic Lt hip & knee pain, OP, bilat occipital neuralgia  PAIN:  Are you having pain?  NPRS scale: 0/10 current, 2-3/10 worst  Pain location:  L posterolateral hip, L lateral knee/distal ITB Pain description:  Aggravating factors:  Relieving factors:   PRECAUTIONS:  None  RED FLAGS: None   WEIGHT BEARING RESTRICTIONS:  Yes WBAT  FALLS:  Has patient fallen in last 6 months? No  LIVING ENVIRONMENT: 2 steps to enter home with handrail on Rt when ascending Does not need to go upstairs- bedroom is on the first floor  OCCUPATION:  retired  PLOF:  Independent  PATIENT GOALS:  Walk without pain and limping, stairs, water walking, I have a treadmill at home.    OBJECTIVE:  Note: Objective measures were completed at Evaluation unless otherwise noted.  TREATMENT DATE:   L hip AROM:  12/17/23  /  Current Flexion:  105  /  104 Abduction:  30 /  30 ER:  16  /  21 IR:  27  /  29   R hip AROM: (12/17/23) ER: 29 IR:  31  Hip strength:  R / L Flexion:  5/5 /  4+/5 Abd:  25 / 23.5 HHD.  MMT L:  4-/5 Knee extension:  5/5  /  5/5   Reviewed HEP compliance, response to prior Rx, and pain level.  Nustep L3-4 UE/LE's x 5 mins Supine bridge 2x10 S/L hip abduction 2x10 with pillow b/w knees Lateral band walks with RTB around thighs x3 laps with UE's on rail Lateral step ups 6 inch x 10 Hip hiking 2 x 10 bilat with UE support on rail Supine manual modified thomas stretch 2x30 sec  Reviewed  HEP.   PATIENT EDUCATION:  Education details:  exercise form, relevant anatomy, rationale of interventions, POC, and HEP.  Person educated: Patient Education method: Explanation, demonstration, verbal cues Education comprehension: verbalized understanding, returned demonstration, verbal cues required  HOME EXERCISE PROGRAM: [email or web address]    ASSESSMENT:  CLINICAL IMPRESSION: PT assessed hip ROM and strength today.  Pt has minimally less hip flexion and minimally increased hip IR though demonstrates improved hip ER AROM.  Pt has made good progress in LE strength.  Pt's R hip abduction strength is at 94% of L hip as evidenced by HHD testing.  Pt met LTG #2.  She was able to perform S/L hip abduction well and perform increased reps today.  Pt has pain with bridging at home though had no pain with bridging in the clinic.  Pt thinks it may be the surface of the bed.  Pt performed exercises well.  Pt reports discomfort in her knee, soreness in lumbar, and no pain in her hip after Rx.  PT to review and work on independence with HEP next visit.     REHAB POTENTIAL: Good  CLINICAL DECISION MAKING: Stable/uncomplicated  EVALUATION COMPLEXITY: Low   GOALS: Goals reviewed with patient? Yes  SHORT TERM GOALS: Target date: 10/07/23 (4 wks post op)  Able to demo gait with proper pattern for houshold distances without AD Baseline: Goal status: Met - 10/23/23  2.  Hip flexion to 90 deg without pain Baseline:  Goal status:Met- 09/22/23  3.  Good quad and glut activation in CKC Baseline:  Goal status: Met 11/11/23   LONG TERM GOALS: Target date: 6/4/25025  Perform stairs step over step without pain Baseline:  Goal status: 80% MET   4/30  2.  MMT 90% of opposite lower extremity---abd measured with HHD, Pt unable lie in L S/L'ing to test R LE due to pain/discomort in L hip.   Baseline:  Goal status:  GOAL MET  5/29  3.  Ambulate community distances without pain Baseline:  Goal status:  GOAL  MET  4/30  4.  Perform proper pelvic control in squat and SLS Baseline:  Goal status: INITIAL  5.  Pt will report she is able to perform activities in the kitchen with improved ease and tolerance.   Goal status:  GOAL MET  5/19    PLAN:  PT FREQUENCY: 1-2x/week  PT DURATION:  5 weeks   PLANNED INTERVENTIONS: 97164- PT Re-evaluation, 97110-Therapeutic exercises, 97530- Therapeutic activity, 97112- Neuromuscular re-education, 97535- Self Care, 16109- Manual therapy, [phone]- Gait training, [phone]- Aquatic Therapy, Patient/Family education, Balance training, Stair training, Taping, Dry Needling,  Joint mobilization, Spinal mobilization, Scar mobilization, Cryotherapy, and Moist heat.  PLAN FOR NEXT SESSION:  Continue per Dr. Verline Glow glute med repair protocol.  Monitor L knee and ITB pain.  Work on [employer] next visit.   Trina Fujita III PT, DPT 01/16/24 5:47 PM

## 2024-01-16 ENCOUNTER — Encounter (HOSPITAL_BASED_OUTPATIENT_CLINIC_OR_DEPARTMENT_OTHER): Payer: Self-pay | Admitting: Physical Therapy

## 2024-01-20 ENCOUNTER — Ambulatory Visit (HOSPITAL_BASED_OUTPATIENT_CLINIC_OR_DEPARTMENT_OTHER): Attending: Orthopaedic Surgery | Admitting: Physical Therapy

## 2024-01-20 ENCOUNTER — Encounter (HOSPITAL_BASED_OUTPATIENT_CLINIC_OR_DEPARTMENT_OTHER): Payer: Self-pay | Admitting: Physical Therapy

## 2024-01-20 DIAGNOSIS — M25552 Pain in left hip: Secondary | ICD-10-CM | POA: Diagnosis not present

## 2024-01-20 DIAGNOSIS — R262 Difficulty in walking, not elsewhere classified: Secondary | ICD-10-CM | POA: Diagnosis not present

## 2024-01-20 DIAGNOSIS — M6281 Muscle weakness (generalized): Secondary | ICD-10-CM | POA: Insufficient documentation

## 2024-01-20 NOTE — Therapy (Signed)
 OUTPATIENT PHYSICAL THERAPY TREATMENT      Patient Name: Emily [name] MRN: [id number] DOB:09/11/1940, 83 y.o., female Today's Date: 01/21/2024  END OF SESSION:  PT End of Session - 01/20/24 1129     Visit Number 28    Number of Visits 28    Authorization Type HTA, no VL    PT Start Time 1030    PT Stop Time 1121    PT Time Calculation (min) 51 min    Activity Tolerance Patient tolerated treatment well    Behavior During Therapy WFL for tasks assessed/performed                         Past Medical History:  Diagnosis Date   Actinic keratosis    Allergic rhinitis    BACK PAIN    CARCINOMA, BREAST, 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   L s/p lumpectomy, chemo and XRT   Cervicalgia    Cholelithiasis    DIVERTICULOSIS, COLON    ENDOMETRIOSIS    Heart palpitations    HYPOTHYROIDISM    Left wrist fracture 11/06/2016   10/2016 - fell of high stool   Osteoporosis    Palpitations    Personal history of chemotherapy    Personal history of radiation therapy    Pleural plaque without asbestos    CT chest 10/2010: R>L lobes, upper> lower   Renal disorder    Squamous cell carcinoma    History of BC   Past Surgical History:  Procedure Laterality Date   BREAST LUMPECTOMY  2001   left   CATARACT EXTRACTION Bilateral 09/2014   both eyes   CHOLECYSTECTOMY  2010   ENDOMETRIAL ABLATION     EXCISION / BIOPSY BREAST / NIPPLE / DUCT Left 2001   GLUTEUS MINIMUS REPAIR Left 09/09/2023   Procedure: LEFT GLUTEUS MAXIMUS TENDON TRANSFER REPAIR;  Surgeon: Wilhelmenia Harada, MD;  Location: [facility];  Service: Orthopedics;  Laterality: Left;   Patient Active Problem List   Diagnosis Date Noted   RLS (restless legs syndrome) 10/31/2023   Vitamin D  deficiency 10/31/2023   Tendinopathy of left gluteus medius 09/09/2023   Acute cystitis 04/16/2023   Change in bowel habits 01/15/2023   Anxiety 01/01/2023   Agatston coronary artery calcium score 22.4   (12/2021) 10/30/2022   Urge incontinence of urine 10/30/2022   Word finding difficulty 10/30/2022   Chronic pain of left knee 08/21/2022   Lumbar radiculopathy 03/25/2022   Cough 12/27/2021   Sleep difficulties 09/27/2021   Cervicogenic headache 04/10/2021   Skin abnormality 02/27/2021   Aortic atherosclerosis (HCC) 09/22/2020   Urinary urgency 03/23/2020   CKD (chronic kidney disease) stage 3, GFR 30-59 ml/min (HCC) 03/22/2020   Prediabetes 03/22/2020   Change in stool 11/17/2019   Right-sided chest wall pain 11/17/2019   Urinary retention 09/23/2019   SOB (shortness of breath) 01/15/2019   Upper airway cough syndrome 01/15/2019   Reactive airway disease 12/05/2018   Tear of left rotator cuff 10/05/2018   Pre-syncope 05/19/2018   DDD (degenerative disc disease), cervical 01/15/2018   Neuralgia 09/12/2017   Presbycusis of both ears 12/11/2016   Memory difficulties 11/06/2016   IBS (irritable bowel syndrome) 04/16/2016   Hyperlipidemia 09/10/2015   Osteoporosis 09/08/2015   Palpitations    Pleural plaque without asbestos 12/03/2010   Chronic neck pain 12/18/2009   Diverticulosis of colon 03/02/2009   Lumbago 03/11/2008   Actinic keratosis 01/23/2008   Malignant neoplasm of female breast (  HCC) 10/12/2007   Hypothyroidism 06/18/2007   Allergic rhinitis 06/11/2007      REFERRING PROVIDER:  Wilhelmenia Harada, MD   REFERRING DIAG:  [phone] (ICD-10-CM) - Tendinopathy of left gluteus medius    s/p Lt GMR  Rationale for Evaluation and Treatment: Rehabilitation  THERAPY DIAG:  Pain in left hip  Muscle weakness (generalized)  Difficulty in walking, not elsewhere classified  ONSET DATE: DOS 09/09/23   SUBJECTIVE:                                                                                                                                                                                           SUBJECTIVE STATEMENT:  Pt is 19 weeks s/p L hip gluteus maximus tendon  transfers and trochanteric bursectomy.   Pt states she is able to perform her ADLs/IADLs and functional mobility skills without limitations and difficulty.  Pt reports her knee is feeling better.  She has been having less pain in knee.  Pt has been performing her stairs a lot.  Pt states she had increased pain for 3 days after prior Rx.  Pt used tylenol .  She began feeling better yesterday.  Pt denies pain currently, just slight soreness.  Pt reports she has been performing a lot of stairs in her home.       PERTINENT HISTORY:  Urge incontinence, chronic Lt hip & knee pain, OP, bilat occipital neuralgia  PAIN:  Are you having pain?  NPRS scale: 0/10 current, 2-3/10 worst  Pain location:  L posterolateral hip, L lateral knee/distal ITB Pain description: slight soreness not constant Aggravating factors:  Relieving factors:   PRECAUTIONS:  None  RED FLAGS: None   WEIGHT BEARING RESTRICTIONS:  Yes WBAT  FALLS:  Has patient fallen in last 6 months? No  LIVING ENVIRONMENT: 2 steps to enter home with handrail on Rt when ascending Does not need to go upstairs- bedroom is on the first floor  OCCUPATION:  retired  PLOF:  Independent  PATIENT GOALS:  Walk without pain and limping, stairs, water walking, I have a treadmill at home.    OBJECTIVE:  Note: Objective measures were completed at Evaluation unless otherwise noted.  TREATMENT DATE:    Reviewed HEP compliance, current function, response to prior Rx, and pain level.  Stairs:  Pt ascended and descended stairs with a reciprocal gait with the rail.   Nustep L3 UE/LE's x 5 mins Squats with and without UE assist on rail Lateral band walks with RTB around thighs 3x10 Standing hip abduction x 10 bilat supine bridge 2x10 Hip hiking 2 x 10 bilat with UE support on rail  Reviewed and updated HEP.   Pt received a HEP handout and was educated in correct form and appropriate frequency.    LEFS:  59/80   PATIENT EDUCATION:  Education details:  exercise form, relevant anatomy, rationale of interventions, POC, and HEP.  Person educated: Patient Education method: Explanation, demonstration, verbal cues Education comprehension: verbalized understanding, returned demonstration, verbal cues required  HOME EXERCISE PROGRAM: [id number]  Updated HEP: - Side Stepping with Resistance at Thighs and Counter Support  - 1 x daily - 3 x weekly - 2-3 sets - Standing Hip Hiking  - 1 x daily - 4 x weekly - 2 sets - 10 reps    ASSESSMENT:  CLINICAL IMPRESSION: Pt has made great progress in PT.  Pt is performing her ADLs/IADLs and functional mobility skills without limitations and difficulty.  Pt performs stairs with a reciprocal gait with the rail.  Pt has made good progress in LE strength.  PT spent time reviewing, updating, and educating pt in final HEP.  Pt received an advanced HEP handout and is independent with HEP.  Pt has good form with exercises and performed exercises well without c/o's.  Pt has met all goals except PT did not assess SLS.  Pt had no c/o's after Rx.  She is ready for discharge.     REHAB POTENTIAL: Good  CLINICAL DECISION MAKING: Stable/uncomplicated  EVALUATION COMPLEXITY: Low   GOALS: Goals reviewed with patient? Yes  SHORT TERM GOALS: Target date: 10/07/23 (4 wks post op)  Able to demo gait with proper pattern for houshold distances without AD Baseline: Goal status: Met - 10/23/23  2.  Hip flexion to 90 deg without pain Baseline:  Goal status:Met- 09/22/23  3.  Good quad and glut activation in CKC Baseline:  Goal status: Met 11/11/23   LONG TERM GOALS: Target date: 6/4/25025  Perform stairs step over step without pain Baseline:  Goal status: GOAL MET   6/3  2.  MMT 90% of opposite lower extremity---abd measured with HHD, Pt unable lie in L S/L'ing to test R  LE due to pain/discomort in L hip.   Baseline:  Goal status:  GOAL MET  5/29  3.  Ambulate community distances without pain Baseline:  Goal status:  GOAL MET  4/30  4.  Perform proper pelvic control in squat and SLS Baseline:  Goal status:  50% MET.  DID NOT ASSESS SLS.  6/3  5.  Pt will report she is able to perform activities in the kitchen with improved ease and tolerance.   Goal status:  GOAL MET  5/19    PLAN:    PLANNED INTERVENTIONS: 97164- PT Re-evaluation, 97110-Therapeutic exercises, 97530- Therapeutic activity, 97112- Neuromuscular re-education, 97535- Self Care, 25427- Manual therapy, [phone]- Gait training, [phone]- Aquatic Therapy, Patient/Family education, Balance training, Stair training, Taping, Dry Needling, Joint mobilization, Spinal mobilization, Scar mobilization, Cryotherapy, and Moist heat.  PLAN FOR NEXT SESSION:   Pt to be discharged from skilled PT due to meeting all goals except not assessing SLS.  She is independent with HEP  and will cont wit HEP.  Pt is agreeable with discharge.  PHYSICAL THERAPY DISCHARGE SUMMARY  Visits from Start of Care: 28  Current functional level related to goals / functional outcomes: See above   Remaining deficits:  Education / Equipment: See above    Trina Fujita III PT, DPT 01/21/24 5:23 PM

## 2024-01-29 ENCOUNTER — Other Ambulatory Visit: Payer: Self-pay

## 2024-01-29 ENCOUNTER — Encounter (HOSPITAL_COMMUNITY): Payer: Self-pay

## 2024-01-29 ENCOUNTER — Other Ambulatory Visit (HOSPITAL_COMMUNITY): Payer: Self-pay

## 2024-01-29 ENCOUNTER — Other Ambulatory Visit (HOSPITAL_BASED_OUTPATIENT_CLINIC_OR_DEPARTMENT_OTHER): Payer: Self-pay

## 2024-01-29 ENCOUNTER — Telehealth: Payer: Self-pay

## 2024-01-29 DIAGNOSIS — M810 Age-related osteoporosis without current pathological fracture: Secondary | ICD-10-CM

## 2024-01-29 MED ORDER — DENOSUMAB 60 MG/ML ~~LOC~~ SOSY
60.0000 mg | PREFILLED_SYRINGE | Freq: Once | SUBCUTANEOUS | 0 refills | Status: AC
  Filled 2024-01-29 – 2024-01-30 (×2): qty 1, 180d supply, fill #0

## 2024-01-29 MED ORDER — DENOSUMAB 60 MG/ML ~~LOC~~ SOSY
60.0000 mg | PREFILLED_SYRINGE | Freq: Once | SUBCUTANEOUS | 0 refills | Status: AC
  Filled 2024-01-29 – 2024-07-16 (×2): qty 1, 180d supply, fill #0

## 2024-01-29 NOTE — Progress Notes (Signed)
 Patient to be enrolled with [facility]. Routed to Tiffany.

## 2024-01-29 NOTE — Addendum Note (Signed)
 Addended by: Katherene Pals on: 01/29/2024 02:42 PM   Modules accepted: Orders

## 2024-01-29 NOTE — Telephone Encounter (Signed)
 Option# 2- Med Obtained from pharmacy:   Pharmacy benefit: Copay $250 (Paid to pharmacy) Admin Fee: 0% (Pay at clinic)   Prior Auth: N/A PA# Expiration Date:   # of doses approved:

## 2024-01-29 NOTE — Telephone Encounter (Signed)
 Copied from CRM [id number]. Topic: General - Other >> Jan 28, 2024  8:23 AM Chasity T wrote: Reason for CRM: Patient is calling in because she is wanting to get her prolea shot while she is at her appointment on June 16th and wants to know if she can do so while she is there.

## 2024-01-29 NOTE — Telephone Encounter (Signed)
 Pt ready for scheduling for PROLIA  on or after : 02/02/24   Option# 1: Buy/Bill (Office supplied medication)   Out-of-pocket cost due at time of clinic visit: $332

## 2024-01-30 ENCOUNTER — Other Ambulatory Visit: Payer: Self-pay

## 2024-01-30 ENCOUNTER — Other Ambulatory Visit (HOSPITAL_COMMUNITY): Payer: Self-pay

## 2024-01-30 DIAGNOSIS — M810 Age-related osteoporosis without current pathological fracture: Secondary | ICD-10-CM

## 2024-01-30 NOTE — Progress Notes (Signed)
 Specialty Pharmacy Initial Fill Coordination Note  Emily [name] is a 83 y.o. female contacted today regarding initial fill of specialty medication(s) Denosumab  (PROLIA )   Patient requested Courier to Provider Office   Delivery date: 02/02/24   Verified address: [facility] [address]   Medication will be filled on 6/13   Patient is aware of $250 copayment.

## 2024-01-30 NOTE — Progress Notes (Signed)
 Pharmacy Patient Advocate Encounter  Insurance verification completed.   The patient is insured through [facility] ADVANTAGE/RX ADVANCE   Ran test claim for Prolia. Co-pay is $250.   This test claim was processed through [facility]- copay amounts may vary at other pharmacies due to pharmacy/plan contracts, or as the patient moves through the different stages of their insurance plan.

## 2024-02-01 NOTE — Patient Instructions (Incomplete)
      Try acupuncture.      Medications changes include :   None

## 2024-02-01 NOTE — Progress Notes (Unsigned)
    Subjective:    Patient ID: Emily [name], female    DOB: 1941/07/01, 83 y.o.   MRN: [id number]      HPI Sheyna is here for No chief complaint on file.  For Prolia  shot today.  Headaches:   Has seen neurology.  Saw neurosurgery 09/2023 and they recommended bilateral C2-C4 medial branch blocks.  If improvement at 80% could consider radiofrequency ablation.   Medications and allergies reviewed with patient and updated if appropriate.  Current Outpatient Medications on File Prior to Visit  Medication Sig Dispense Refill   atenolol  (TENORMIN ) 25 MG tablet TAKE 1 TABLET BY MOUTH EVERY DAY 90 tablet 2   Calcium Carb-Cholecalciferol (CALCIUM 600 + D PO) Take by mouth.     chlorpheniramine  (CHLOR-TRIMETON ) 4 MG tablet Take 1 tablet (4 mg total) by mouth 2 (two) times daily as needed for allergies.     cholecalciferol (VITAMIN D ) 25 MCG (1000 UNIT) tablet Take 1,000 Units by mouth daily.     EPINEPHrine  0.3 mg/0.3 mL IJ SOAJ injection Inject 0.3 mg into the muscle as needed for anaphylaxis. 1 each 0   gabapentin  (NEURONTIN ) 100 MG capsule TAKE 1 CAPSULE (100 MG TOTAL) BY MOUTH AT BEDTIME. TAKE 1 TO 3 CAPSULES AT BEDTIME 30 capsule 3   ipratropium (ATROVENT ) 0.03 % nasal spray SPRAY 2 SPRAYS INTO EACH NOSTRIL EVERY 12 HOURS 30 mL 2   levothyroxine  (SYNTHROID ) 75 MCG tablet TAKE 1 TABLET BY MOUTH EVERY DAY 6 DAYS A WEEK AND 1.5 TABLET PO ONE DAY A WEEK 96 tablet 3   Current Facility-Administered Medications on File Prior to Visit  Medication Dose Route Frequency Provider Last Rate Last Admin   [START ON 02/02/2024] denosumab  (PROLIA ) injection 60 mg  60 mg Subcutaneous Once Colene Dauphin, MD        Review of Systems     Objective:  There were no vitals filed for this visit. BP Readings from Last 3 Encounters:  11/28/23 120/80  10/31/23 122/70  09/15/23 118/72   Wt Readings from Last 3 Encounters:  01/02/24 123 lb (55.8 kg)  11/28/23 123 lb (55.8 kg)  10/31/23 123 lb (55.8  kg)   There is no height or weight on file to calculate BMI.    Physical Exam         Assessment & Plan:    See Problem List for Assessment and Plan of chronic medical problems.

## 2024-02-02 ENCOUNTER — Ambulatory Visit (HOSPITAL_BASED_OUTPATIENT_CLINIC_OR_DEPARTMENT_OTHER): Admitting: Orthopaedic Surgery

## 2024-02-02 ENCOUNTER — Ambulatory Visit (INDEPENDENT_AMBULATORY_CARE_PROVIDER_SITE_OTHER): Admitting: Internal Medicine

## 2024-02-02 ENCOUNTER — Encounter: Payer: Self-pay | Admitting: Internal Medicine

## 2024-02-02 VITALS — BP 124/80 | HR 50 | Temp 98.0°F | Ht 60.0 in | Wt 122.0 lb

## 2024-02-02 DIAGNOSIS — M810 Age-related osteoporosis without current pathological fracture: Secondary | ICD-10-CM

## 2024-02-02 DIAGNOSIS — G4486 Cervicogenic headache: Secondary | ICD-10-CM

## 2024-02-02 MED ORDER — IPRATROPIUM BROMIDE 0.03 % NA SOLN: NASAL | 11 refills | Status: DC

## 2024-02-02 MED ORDER — DENOSUMAB 60 MG/ML ~~LOC~~ SOSY: 60.0000 mg | PREFILLED_SYRINGE | SUBCUTANEOUS | Status: AC

## 2024-02-02 MED ORDER — DENOSUMAB 60 MG/ML ~~LOC~~ SOSY
60.0000 mg | PREFILLED_SYRINGE | SUBCUTANEOUS | Status: AC
  Administered 2024-08-06: 60 mg via SUBCUTANEOUS

## 2024-02-02 NOTE — Assessment & Plan Note (Signed)
 Chronic DEXA up-to-date  Received Prolia  08/04/2023 Prolia  injection today Continue Prolia  every 6 months Stressed regular exercise, calcium and vitamin D   Last vitamin D  Lab Results  Component Value Date   VD25OH 37.51 10/31/2023

## 2024-02-02 NOTE — Assessment & Plan Note (Signed)
 Chronic Has seen neurology and neurosurgery He has not eligible for nerve block/ablation since she does not have any pain when she goes in for the procedure Gabapentin  200 mg at bedtime is helping Heat sometimes helps No harm in trying acupuncture so she will go ahead and try it

## 2024-02-04 ENCOUNTER — Ambulatory Visit (INDEPENDENT_AMBULATORY_CARE_PROVIDER_SITE_OTHER)

## 2024-02-04 ENCOUNTER — Other Ambulatory Visit (HOSPITAL_BASED_OUTPATIENT_CLINIC_OR_DEPARTMENT_OTHER): Payer: Self-pay

## 2024-02-04 ENCOUNTER — Ambulatory Visit (HOSPITAL_BASED_OUTPATIENT_CLINIC_OR_DEPARTMENT_OTHER): Admitting: Orthopaedic Surgery

## 2024-02-04 DIAGNOSIS — M545 Low back pain, unspecified: Secondary | ICD-10-CM | POA: Diagnosis not present

## 2024-02-04 DIAGNOSIS — M67952 Unspecified disorder of synovium and tendon, left thigh: Secondary | ICD-10-CM | POA: Diagnosis not present

## 2024-02-04 DIAGNOSIS — M5126 Other intervertebral disc displacement, lumbar region: Secondary | ICD-10-CM | POA: Diagnosis not present

## 2024-02-04 DIAGNOSIS — M47816 Spondylosis without myelopathy or radiculopathy, lumbar region: Secondary | ICD-10-CM | POA: Diagnosis not present

## 2024-02-04 DIAGNOSIS — M419 Scoliosis, unspecified: Secondary | ICD-10-CM | POA: Diagnosis not present

## 2024-02-04 MED ORDER — METHOCARBAMOL 500 MG PO TABS
500.0000 mg | ORAL_TABLET | Freq: Four times a day (QID) | ORAL | 0 refills | Status: AC
  Filled 2024-02-04: qty 56, 14d supply, fill #0

## 2024-02-04 NOTE — Progress Notes (Signed)
 Post Operative Evaluation    Procedure/Date of Surgery: Left hip gluteus maximus tendon transfer 1/21  Interval History:   Presents today 6 months status post left hip gluteus maximus tendon transfer overall doing well.  She did recently leaned down to pick up a top of her toilet bowl tank and subsequently had pain in the lower back.  This is radiating down the right lateral aspect of the hip.  This has strained her back significantly and she is recovering slowly from this   PMH/PSH/Family History/Social History/Meds/Allergies:    Past Medical History:  Diagnosis Date   Actinic keratosis    Allergic rhinitis    BACK PAIN    CARCINOMA, BREAST, 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   L s/p lumpectomy, chemo and XRT   Cervicalgia    Cholelithiasis    DIVERTICULOSIS, COLON    ENDOMETRIOSIS    Heart palpitations    HYPOTHYROIDISM    Left wrist fracture 11/06/2016   10/2016 - fell of high stool   Osteoporosis    Palpitations    Personal history of chemotherapy    Personal history of radiation therapy    Pleural plaque without asbestos    CT chest 10/2010: R>L lobes, upper> lower   Renal disorder    Squamous cell carcinoma    History of BC   Past Surgical History:  Procedure Laterality Date   BREAST LUMPECTOMY  2001   left   CATARACT EXTRACTION Bilateral 09/2014   both eyes   CHOLECYSTECTOMY  2010   ENDOMETRIAL ABLATION     EXCISION / BIOPSY BREAST / NIPPLE / DUCT Left 2001   GLUTEUS MINIMUS REPAIR Left 09/09/2023   Procedure: LEFT GLUTEUS MAXIMUS TENDON TRANSFER REPAIR;  Surgeon: Wilhelmenia Harada, MD;  Location: [facility];  Service: Orthopedics;  Laterality: Left;   Social History   Socioeconomic History   Marital status: Married    Spouse name: Not on file   Number of children: 2   Years of education: Not on file   Highest education level: Not on file  Occupational History   Occupation: Retired    Comment: Building control surveyor  Tobacco Use   Smoking status: Never    Passive exposure: Never   Smokeless tobacco: Never  Vaping Use   Vaping status: Never Used  Substance and Sexual Activity   Alcohol use: Not Currently   Drug use: No   Sexual activity: Not Currently    Birth control/protection: Post-menopausal  Other Topics Concern   Not on file  Social History Narrative   Married, lives in [place] area since 2008 from [place]  to be close to dtr   Social Drivers of Health   Financial Resource Strain: Low Risk  (01/02/2024)   Overall Financial Resource Strain (CARDIA)    Difficulty of Paying Living Expenses: Not hard at all  Food Insecurity: No Food Insecurity (01/02/2024)   Hunger Vital Sign    Worried About Running Out of Food in the Last Year: Never true    Ran Out of Food in the Last Year: Never true  Transportation Needs: No Transportation Needs (01/02/2024)   PRAPARE - Administrator, Civil Service (Medical): No    Lack of Transportation (Non-Medical): No  Physical Activity: Insufficiently Active (01/02/2024)   Exercise Vital Sign    Days  of Exercise per Week: 2 days    Minutes of Exercise per Session: 40 min  Stress: No Stress Concern Present (01/02/2024)   [employer] of Occupational Health - Occupational Stress Questionnaire    Feeling of Stress : Not at all  Social Connections: Socially Integrated (01/02/2024)   Social Connection and Isolation Panel    Frequency of Communication with Friends and Family: More than three times a week    Frequency of Social Gatherings with Friends and Family: More than three times a week    Attends Religious Services: More than 4 times per year    Active Member of [employer] or Organizations: Yes    Attends Engineer, structural: More than 4 times per year    Marital Status: Married   Family History  Problem Relation Age of Onset   Dementia Mother    Heart disease Father    Emphysema Father        smoked and worked with asbestos    Colon cancer Neg Hx    Esophageal cancer Neg Hx    Rectal cancer Neg Hx    Migraines Neg Hx    Headache Neg Hx    Allergies  Allergen Reactions   Covid-19 (Mrna) Vaccine Anaphylaxis   Molnupiravir  Other (See Comments)    Tongue swelling   Allegra [Fexofenadine]     Didn't agree with Pt    Levaquin  [Levofloxacin ] Swelling and Other (See Comments)    Hallucinations, swelling of throat   Nexium  [Esomeprazole  Magnesium ]     diarrhea   Prednisone      Facial flushing   Valerian Root Other (See Comments)    Loose stools   Current Outpatient Medications  Medication Sig Dispense Refill   methocarbamol  (ROBAXIN ) 500 MG tablet Take 1 tablet (500 mg total) by mouth 4 (four) times daily for 14 days. 56 tablet 0   atenolol  (TENORMIN ) 25 MG tablet TAKE 1 TABLET BY MOUTH EVERY DAY 90 tablet 2   Calcium Carb-Cholecalciferol (CALCIUM 600 + D PO) Take by mouth.     chlorpheniramine  (CHLOR-TRIMETON ) 4 MG tablet Take 1 tablet (4 mg total) by mouth 2 (two) times daily as needed for allergies.     cholecalciferol (VITAMIN D ) 25 MCG (1000 UNIT) tablet Take 1,000 Units by mouth daily.     EPINEPHrine  0.3 mg/0.3 mL IJ SOAJ injection Inject 0.3 mg into the muscle as needed for anaphylaxis. 1 each 0   gabapentin  (NEURONTIN ) 100 MG capsule TAKE 1 CAPSULE (100 MG TOTAL) BY MOUTH AT BEDTIME. TAKE 1 TO 3 CAPSULES AT BEDTIME 30 capsule 3   ipratropium (ATROVENT ) 0.03 % nasal spray SPRAY 2 SPRAYS INTO EACH NOSTRIL EVERY 12 HOURS 30 mL 11   levothyroxine  (SYNTHROID ) 75 MCG tablet TAKE 1 TABLET BY MOUTH EVERY DAY 6 DAYS A WEEK AND 1.5 TABLET PO ONE DAY A WEEK 96 tablet 3   Current Facility-Administered Medications  Medication Dose Route Frequency Provider Last Rate Last Admin   denosumab  (PROLIA ) injection 60 mg  60 mg Subcutaneous Once Colene Dauphin, MD       denosumab  (PROLIA ) injection 60 mg  60 mg Subcutaneous Q6 months Burns, Stacy J, MD   60 mg at 02/02/24 1542   [START ON 08/03/2024] denosumab  (PROLIA )  injection 60 mg  60 mg Subcutaneous Q6 months Burns, Beckey Bourgeois, MD       No results found.  Review of Systems:   A ROS was performed including pertinent positives and negatives as documented in  the HPI.   Musculoskeletal Exam:    There were no vitals taken for this visit.  Left hip incision is well-appearing without erythema or drainage.  Tenderness over the IT band distally as it runs to the knee.  30 degrees internal/external rotation of the left hip without pain.  Distal neurosensory exam is intact  Tenderness about the lower back with some radiation to the right aspect of the buttocks SI joint area.  Otherwise negative straight leg raise and full painless range of motion of bilateral hips  Imaging:      I personally reviewed and interpreted the radiographs.   Assessment:   50-months status post left hip gluteus maximus tendon transfer overall doing well.  Overall she is doing extremely well.  She is having some symptoms about the right lower back area.  She will continue to do her exercises this time.  I have also provided an additional prescription for Robaxin  as well.  To see her back as needed Plan :    - Return to clinic as needed        I personally saw and evaluated the patient, and participated in the management and treatment plan.  Wilhelmenia Harada, MD Attending Physician, Orthopedic Surgery  This document was dictated using Dragon voice recognition software. A reasonable attempt at proof reading has been made to minimize errors.

## 2024-02-09 ENCOUNTER — Encounter: Admitting: Internal Medicine

## 2024-02-12 DIAGNOSIS — L821 Other seborrheic keratosis: Secondary | ICD-10-CM | POA: Diagnosis not present

## 2024-02-12 DIAGNOSIS — D2261 Melanocytic nevi of right upper limb, including shoulder: Secondary | ICD-10-CM | POA: Diagnosis not present

## 2024-02-12 DIAGNOSIS — D225 Melanocytic nevi of trunk: Secondary | ICD-10-CM | POA: Diagnosis not present

## 2024-02-12 DIAGNOSIS — D2272 Melanocytic nevi of left lower limb, including hip: Secondary | ICD-10-CM | POA: Diagnosis not present

## 2024-02-12 DIAGNOSIS — H40013 Open angle with borderline findings, low risk, bilateral: Secondary | ICD-10-CM | POA: Diagnosis not present

## 2024-02-12 DIAGNOSIS — L719 Rosacea, unspecified: Secondary | ICD-10-CM | POA: Diagnosis not present

## 2024-02-12 DIAGNOSIS — L578 Other skin changes due to chronic exposure to nonionizing radiation: Secondary | ICD-10-CM | POA: Diagnosis not present

## 2024-02-12 DIAGNOSIS — H35371 Puckering of macula, right eye: Secondary | ICD-10-CM | POA: Diagnosis not present

## 2024-02-12 DIAGNOSIS — D229 Melanocytic nevi, unspecified: Secondary | ICD-10-CM | POA: Diagnosis not present

## 2024-02-12 DIAGNOSIS — D2230 Melanocytic nevi of unspecified part of face: Secondary | ICD-10-CM | POA: Diagnosis not present

## 2024-02-23 ENCOUNTER — Ambulatory Visit
Admission: RE | Admit: 2024-02-23 | Discharge: 2024-02-23 | Disposition: A | Discharge status: Home / Self Care | Source: Ambulatory Visit | Attending: Internal Medicine | Admitting: Internal Medicine

## 2024-02-23 DIAGNOSIS — Z1231 Encounter for screening mammogram for malignant neoplasm of breast: Secondary | ICD-10-CM

## 2024-03-10 ENCOUNTER — Encounter: Payer: Self-pay | Admitting: Internal Medicine

## 2024-03-15 ENCOUNTER — Other Ambulatory Visit: Payer: Self-pay

## 2024-03-15 DIAGNOSIS — M810 Age-related osteoporosis without current pathological fracture: Secondary | ICD-10-CM

## 2024-03-19 ENCOUNTER — Ambulatory Visit (INDEPENDENT_AMBULATORY_CARE_PROVIDER_SITE_OTHER)
Admission: RE | Admit: 2024-03-19 | Discharge: 2024-03-19 | Disposition: A | Discharge status: Home / Self Care | Source: Ambulatory Visit | Attending: Internal Medicine | Admitting: Internal Medicine

## 2024-03-19 DIAGNOSIS — Z780 Asymptomatic menopausal state: Secondary | ICD-10-CM | POA: Diagnosis not present

## 2024-03-20 ENCOUNTER — Ambulatory Visit: Payer: Self-pay | Admitting: Internal Medicine

## 2024-03-20 DIAGNOSIS — M810 Age-related osteoporosis without current pathological fracture: Secondary | ICD-10-CM

## 2024-04-05 ENCOUNTER — Ambulatory Visit (INDEPENDENT_AMBULATORY_CARE_PROVIDER_SITE_OTHER): Admitting: Internal Medicine

## 2024-04-05 ENCOUNTER — Encounter: Payer: Self-pay | Admitting: Internal Medicine

## 2024-04-05 VITALS — BP 116/68 | HR 62 | Temp 98.2°F | Ht 60.0 in | Wt 123.0 lb

## 2024-04-05 DIAGNOSIS — J301 Allergic rhinitis due to pollen: Secondary | ICD-10-CM

## 2024-04-05 MED ORDER — MOMETASONE FUROATE 50 MCG/ACT NA SUSP: 2.0000 | Freq: Every day | NASAL | 12 refills | Status: AC

## 2024-04-05 NOTE — Progress Notes (Signed)
 Subjective:    Patient ID: Emily [name], female    DOB: 07-Apr-1941, 83 y.o.   MRN: [id number]      HPI Doreen is here for  Chief Complaint  Patient presents with   Nasal Congestion    Nasal congestion and some cough noted x 1 week     One week of nasal congestion - one nostril or the other.  At night it is difficulty to breath - has to take deep breathe to breath -- no DOE.  Nose runs a lot.  Has itchy, watery eyes, mild sinus pressure.     Feels like allergy  meds are not working.  Zyrtec  was not helping so switched back - chlorpheniramine .  Atrovent  nasal spray daily.     Medications and allergies reviewed with patient and updated if appropriate.  Current Outpatient Medications on File Prior to Visit  Medication Sig Dispense Refill   atenolol  (TENORMIN ) 25 MG tablet TAKE 1 TABLET BY MOUTH EVERY DAY 90 tablet 2   Calcium Carb-Cholecalciferol (CALCIUM 600 + D PO) Take by mouth.     chlorpheniramine  (CHLOR-TRIMETON ) 4 MG tablet Take 1 tablet (4 mg total) by mouth 2 (two) times daily as needed for allergies.     cholecalciferol (VITAMIN D ) 25 MCG (1000 UNIT) tablet Take 1,000 Units by mouth daily.     EPINEPHrine  0.3 mg/0.3 mL IJ SOAJ injection Inject 0.3 mg into the muscle as needed for anaphylaxis. 1 each 0   gabapentin  (NEURONTIN ) 100 MG capsule TAKE 1 CAPSULE (100 MG TOTAL) BY MOUTH AT BEDTIME. TAKE 1 TO 3 CAPSULES AT BEDTIME 30 capsule 3   ipratropium (ATROVENT ) 0.03 % nasal spray SPRAY 2 SPRAYS INTO EACH NOSTRIL EVERY 12 HOURS 30 mL 11   levothyroxine  (SYNTHROID ) 75 MCG tablet TAKE 1 TABLET BY MOUTH EVERY DAY 6 DAYS A WEEK AND 1.5 TABLET PO ONE DAY A WEEK 96 tablet 3   Current Facility-Administered Medications on File Prior to Visit  Medication Dose Route Frequency Provider Last Rate Last Admin   denosumab  (PROLIA ) injection 60 mg  60 mg Subcutaneous Once Geofm Glade PARAS, MD       denosumab  (PROLIA ) injection 60 mg  60 mg Subcutaneous Q6 months Ra Pfiester J, MD    60 mg at 02/02/24 1542   [START ON 08/03/2024] denosumab  (PROLIA ) injection 60 mg  60 mg Subcutaneous Q6 months Oma Marzan, Glade PARAS, MD        Review of Systems  Constitutional:  Negative for fever.  HENT:  Positive for congestion, postnasal drip (chronic), rhinorrhea and sinus pressure (mild). Negative for ear pain, sinus pain and sore throat.   Eyes:  Positive for discharge (watery) and itching.  Respiratory:  Positive for shortness of breath (started exercise class - ? related to that). Negative for cough and wheezing.   Neurological:  Positive for headaches. Negative for dizziness and light-headedness.       Objective:   Vitals:   04/05/24 1413  BP: 116/68  Pulse: 62  Temp: 98.2 F (36.8 C)  SpO2: 98%   BP Readings from Last 3 Encounters:  04/05/24 116/68  02/02/24 124/80  11/28/23 120/80   Wt Readings from Last 3 Encounters:  04/05/24 123 lb (55.8 kg)  02/02/24 122 lb (55.3 kg)  01/02/24 123 lb (55.8 kg)   Body mass index is 24.02 kg/m.    Physical Exam Constitutional:      General: She is not in acute distress.    Appearance: Normal appearance.  She is not ill-appearing.  HENT:     Head: Normocephalic and atraumatic.     Right Ear: Tympanic membrane, ear canal and external ear normal.     Left Ear: Tympanic membrane, ear canal and external ear normal.     Mouth/Throat:     Mouth: Mucous membranes are moist.     Pharynx: No oropharyngeal exudate or posterior oropharyngeal erythema.  Eyes:     Conjunctiva/sclera: Conjunctivae normal.  Cardiovascular:     Rate and Rhythm: Normal rate and regular rhythm.  Pulmonary:     Effort: Pulmonary effort is normal. No respiratory distress.     Breath sounds: Normal breath sounds. No wheezing or rales.  Musculoskeletal:     Cervical back: Neck supple. No tenderness.  Lymphadenopathy:     Cervical: No cervical adenopathy.  Skin:    General: Skin is warm and dry.  Neurological:     Mental Status: She is alert.             Assessment & Plan:    See Problem List for Assessment and Plan of chronic medical problems.

## 2024-04-05 NOTE — Assessment & Plan Note (Signed)
 No evidence of injection Likely seasonal allergies Switched from zyrtec  to chlorpheniramine  which works better Taking ipratropium nasal spray Flonase  not effective in past Try nasonex  nasal spray daily for at least one week Can consider singular Referral to allergy 

## 2024-04-05 NOTE — Patient Instructions (Addendum)
    Try nasonex  nasal spray.      A referral was ordered for allergy  and someone will call you to schedule an appointment.     Return if symptoms worsen or fail to improve.

## 2024-04-12 ENCOUNTER — Ambulatory Visit (INDEPENDENT_AMBULATORY_CARE_PROVIDER_SITE_OTHER): Admitting: Internal Medicine

## 2024-04-12 ENCOUNTER — Encounter: Payer: Self-pay | Admitting: Internal Medicine

## 2024-04-12 VITALS — BP 120/80 | HR 60 | Temp 98.2°F | Ht 60.0 in

## 2024-04-12 DIAGNOSIS — J069 Acute upper respiratory infection, unspecified: Secondary | ICD-10-CM | POA: Diagnosis not present

## 2024-04-12 DIAGNOSIS — R062 Wheezing: Secondary | ICD-10-CM

## 2024-04-12 MED ORDER — AMOXICILLIN-POT CLAVULANATE 875-125 MG PO TABS: 1.0000 | ORAL_TABLET | Freq: Two times a day (BID) | ORAL | 0 refills | Status: DC

## 2024-04-12 MED ORDER — ALBUTEROL SULFATE HFA 108 (90 BASE) MCG/ACT IN AERS: 2.0000 | INHALATION_SPRAY | Freq: Four times a day (QID) | RESPIRATORY_TRACT | 0 refills | Status: AC | PRN

## 2024-04-12 NOTE — Patient Instructions (Addendum)
       Medications changes include :   Augmentin  and albuterol  inhaler      Return if symptoms worsen or fail to improve.

## 2024-04-12 NOTE — Assessment & Plan Note (Signed)
 Subacute Given persistent symptoms concern this is an infection and not just allergies -- likely a combination of both Continue allergy  medications Start augmentin  bid x 10 days Albuterol  inhaler Q 6 hr prn for cough, wheeze

## 2024-04-12 NOTE — Assessment & Plan Note (Signed)
 Acute wheeze on exam   - minimal Related to URI, allergies Start albuterol  Q 6 hr prn

## 2024-04-12 NOTE — Progress Notes (Signed)
 Subjective:    Patient ID: Emily [name], female    DOB: 1941-04-30, 83 y.o.   MRN: [id number]      HPI Emily [name] is here for  Chief Complaint  Patient presents with   Cough    Cough and congestion; last night had pain in left ear; some nausea; non productive cough (not able produce cough); Still lingering from last visit on Monday     Symptoms have not improved - she feels this is more than just allergies.  She continues to have cough - mostly dry, nasal congestion PND, left ear pain, sinus pressure, SOB and occ wheeze.  She also states lightheadedness.  She denies fever.     Medications and allergies reviewed with patient and updated if appropriate.  Current Outpatient Medications on File Prior to Visit  Medication Sig Dispense Refill   atenolol  (TENORMIN ) 25 MG tablet TAKE 1 TABLET BY MOUTH EVERY DAY 90 tablet 2   Calcium Carb-Cholecalciferol (CALCIUM 600 + D PO) Take by mouth.     chlorpheniramine  (CHLOR-TRIMETON ) 4 MG tablet Take 1 tablet (4 mg total) by mouth 2 (two) times daily as needed for allergies.     cholecalciferol (VITAMIN D ) 25 MCG (1000 UNIT) tablet Take 1,000 Units by mouth daily.     EPINEPHrine  0.3 mg/0.3 mL IJ SOAJ injection Inject 0.3 mg into the muscle as needed for anaphylaxis. 1 each 0   gabapentin  (NEURONTIN ) 100 MG capsule TAKE 1 CAPSULE (100 MG TOTAL) BY MOUTH AT BEDTIME. TAKE 1 TO 3 CAPSULES AT BEDTIME 30 capsule 3   ipratropium (ATROVENT ) 0.03 % nasal spray SPRAY 2 SPRAYS INTO EACH NOSTRIL EVERY 12 HOURS 30 mL 11   levothyroxine  (SYNTHROID ) 75 MCG tablet TAKE 1 TABLET BY MOUTH EVERY DAY 6 DAYS A WEEK AND 1.5 TABLET PO ONE DAY A WEEK 96 tablet 3   mometasone  (NASONEX ) 50 MCG/ACT nasal spray Place 2 sprays into the nose daily. 1 each 12   Current Facility-Administered Medications on File Prior to Visit  Medication Dose Route Frequency Provider Last Rate Last Admin   denosumab  (PROLIA ) injection 60 mg  60 mg Subcutaneous Once Geofm Glade PARAS, MD        denosumab  (PROLIA ) injection 60 mg  60 mg Subcutaneous Q6 months Kyrstyn Greear J, MD   60 mg at 02/02/24 1542   [START ON 08/03/2024] denosumab  (PROLIA ) injection 60 mg  60 mg Subcutaneous Q6 months Shamere Dilworth, Glade PARAS, MD        Review of Systems  Constitutional:  Negative for fever.  HENT:  Positive for congestion, ear pain (left ear), postnasal drip and sinus pressure. Negative for sore throat.   Respiratory:  Positive for cough, shortness of breath and wheezing.   Gastrointestinal:  Positive for nausea.  Neurological:  Positive for light-headedness. Negative for headaches.       Objective:   Vitals:   04/12/24 1544  BP: 120/80  Pulse: 60  Temp: 98.2 F (36.8 C)  SpO2: 96%   BP Readings from Last 3 Encounters:  04/12/24 120/80  04/05/24 116/68  02/02/24 124/80   Wt Readings from Last 3 Encounters:  04/05/24 123 lb (55.8 kg)  02/02/24 122 lb (55.3 kg)  01/02/24 123 lb (55.8 kg)   Body mass index is 24.02 kg/m.    Physical Exam Constitutional:      General: She is not in acute distress.    Appearance: Normal appearance. She is not ill-appearing.  HENT:     Head:  Normocephalic and atraumatic.     Right Ear: Tympanic membrane, ear canal and external ear normal.     Left Ear: Tympanic membrane, ear canal and external ear normal.     Mouth/Throat:     Mouth: Mucous membranes are moist.     Pharynx: No oropharyngeal exudate or posterior oropharyngeal erythema.  Eyes:     Conjunctiva/sclera: Conjunctivae normal.  Cardiovascular:     Rate and Rhythm: Normal rate and regular rhythm.  Pulmonary:     Effort: Pulmonary effort is normal. No respiratory distress.     Breath sounds: Wheezing (one wheeze) present. No rales.  Musculoskeletal:     Cervical back: Neck supple. No tenderness.  Lymphadenopathy:     Cervical: No cervical adenopathy.  Skin:    General: Skin is warm and dry.  Neurological:     Mental Status: She is alert.            Assessment & Plan:     See Problem List for Assessment and Plan of chronic medical problems.

## 2024-04-21 ENCOUNTER — Encounter: Payer: Self-pay | Admitting: Internal Medicine

## 2024-04-22 ENCOUNTER — Encounter: Payer: Self-pay | Admitting: Internal Medicine

## 2024-04-22 MED ORDER — AZITHROMYCIN 250 MG PO TABS: ORAL_TABLET | ORAL | 0 refills | Status: DC

## 2024-04-22 NOTE — Addendum Note (Signed)
 Addended by: GEOFM GLADE PARAS on: 04/22/2024 05:16 PM   Modules accepted: Orders

## 2024-04-30 ENCOUNTER — Ambulatory Visit: Admitting: Allergy

## 2024-04-30 ENCOUNTER — Encounter: Payer: Self-pay | Admitting: Allergy

## 2024-04-30 ENCOUNTER — Other Ambulatory Visit: Payer: Self-pay

## 2024-04-30 VITALS — BP 110/82 | HR 88 | Temp 97.9°F | Resp 14 | Ht 61.0 in | Wt 120.8 lb

## 2024-04-30 DIAGNOSIS — J3089 Other allergic rhinitis: Secondary | ICD-10-CM

## 2024-04-30 DIAGNOSIS — J4520 Mild intermittent asthma, uncomplicated: Secondary | ICD-10-CM

## 2024-04-30 NOTE — Patient Instructions (Addendum)
 Rhinorrhea  Use Nasacort  (triamcinolone ) nasal spray 1-2 sprays per nostril once a day as needed for nasal congestion.  Nasal saline spray (i.e., Simply Saline) or nasal saline lavage (i.e., NeilMed) is recommended as needed and prior to medicated nasal sprays.  Skin testing instructions:  Return for allergy  skin testing. Will make additional recommendations based on results. Make sure you don't take any antihistamines for 3 days before the skin testing appointment. Don't put any lotion on the back and arms on the day of testing.  Must be in good health and not ill. No vaccines/injections/antibiotics within the past 7 days.  Plan on being here for 30-60 minutes.   Breathing Monitor symptoms. May use albuterol  rescue inhaler 2 puffs every 4 to 6 hours as needed for shortness of breath, chest tightness, coughing, and wheezing.  Monitor frequency of use - if you need to use it more than twice per week on a consistent basis let us  know.   Follow up for skin testing. Stop zyrtec  and chlorpheniramine  3 days before.

## 2024-04-30 NOTE — Progress Notes (Signed)
 New Patient Note  RE: Emily [name] MRN: [id number] DOB: [birth year] Date of Office Visit: 04/30/2024  Consult requested by: Geofm Glade PARAS, MD Primary care provider: Geofm Glade PARAS, MD  Chief Complaint: Establish Care (She states she has a runny nose for a year, it is clear. )  History of Present Illness: I had the pleasure of seeing Emily [name] for initial evaluation at the [facility] on 04/30/2024. She is a 83 y.o. female, who is referred here by Geofm Glade PARAS, MD for the evaluation of nasal congestion.  Discussed the use of AI scribe software for clinical note transcription with the patient, who gave verbal consent to proceed.    She has experienced a chronic runny nose for approximately 17 to 18 years, with symptoms most severe in the morning when it is 'like a faucet.' She requires tissues throughout the day. Various oral antihistamines, including Claritin, Zyrtec , and Allegra, provide relief for only about three hours. She is currently using mometasone  nasal spray, which helps for three to four hours. Ipratropium nasal spray was previously used but discontinued due to ineffectiveness. No allergy  testing or ENT specialist consultations have been conducted.  Approximately two weeks ago, she experienced difficulty breathing and wheezing, for which an antibiotic and an inhaler (albuterol ) were prescribed. The inhaler alleviated her shortness of breath. She has used an inhaler previously during a similar episode while living in [place] . No steroids like prednisone  have been used, and no recent chest x-ray has been performed. She recalls a breathing test many years ago but does not remember the details.  No stuffy nose, sneezing, itchy nose, headaches, heartburn, or reflux. No known food allergies, medication allergies, or issues with bee stings or insect stings. No hives or eczema and infrequent antibiotic use. No pets at home and no family history of asthma,  allergies, or eczema. Symptoms worsened after moving from [place]  to [place]  over ten years ago.  No fevers, chills, or recent illnesses. Eating and bathroom habits are normal.     She reports symptoms of rhinorrhea. Symptoms have been going on for 18 years. The symptoms are present all year around with worsening in the mornings. Anosmia: no. Headache: no. She has used otc antihistamines with minimal improvement in symptoms. Sinus infections: no. Previous work up includes: none. Previous ENT evaluation: no, no sinus surgeries. Last eye exam: last year. History of reflux: sometimes.  2 weeks ago had wheezing and difficulty breathing. Improved with antibiotics. Used albuterol  inhaler prn with good benefit. No prednisone .   Assessment and Plan: Emily [name] is a 83 y.o. female with: Other allergic rhinitis Chronic runny nose for 17-18 years, worse in the morning. Symptoms perennial, not associated with sneezing, itchy nose, or headaches. Current treatment with mometasone  nasal spray provides limited relief. Tried azelastine  and Atrovent  in the past.  Use Nasacort  (triamcinolone ) nasal spray 1-2 sprays per nostril once a day as needed for nasal congestion.  Nasal saline spray (i.e., Simply Saline) or nasal saline lavage (i.e., NeilMed) is recommended as needed and prior to medicated nasal sprays. Return for allergy  skin testing. Will make additional recommendations based on results. May benefit from ENT referral for possible nasal cryotherapy.  Mild intermittent reactive airway disease Recent episode of difficulty breathing with wheezing, improved with antibiotics and inhaler use.  Today's spirometry showed restrictive disease with no improvement in FEV1 post bronchodilator treatment. Clinically feeling unchanged.  Monitor symptoms. May use albuterol  rescue inhaler 2 puffs every 4 to 6  hours as needed for shortness of breath, chest tightness, coughing, and wheezing.  Monitor frequency of use  - if you need to use it more than twice per week on a consistent basis let us  know.   Return for Skin testing. 1-55.  No orders of the defined types were placed in this encounter.  Lab Orders  No laboratory test(s) ordered today    Other allergy  screening: Food allergy : no Medication allergy : yes Hymenoptera allergy : no Urticaria: no Eczema:no History of recurrent infections suggestive of immunodeficency: no  Diagnostics: Spirometry:  Tracings reviewed. Her effort: It was hard to get consistent efforts and there is a question as to whether this reflects a maximal maneuver. FVC: 1.46L FEV1: 0.93L, 54% predicted FEV1/FVC ratio: 64% Interpretation: Spirometry consistent with possible restrictive disease with no improvement in FEV1 post bronchodilator treatment. Clinically feeling unchanged.   Please see scanned spirometry results for details.  Results discussed with patient/family.   Past Medical History: Patient Active Problem List   Diagnosis Date Noted   URI (upper respiratory infection) 04/12/2024   Wheezing 04/12/2024   RLS (restless legs syndrome) 10/31/2023   Vitamin D  deficiency 10/31/2023   Tendinopathy of left gluteus medius 09/09/2023   Acute cystitis 04/16/2023   Change in bowel habits 01/15/2023   Anxiety 01/01/2023   Agatston coronary artery calcium score 22.4  (12/2021) 10/30/2022   Urge incontinence of urine 10/30/2022   Word finding difficulty 10/30/2022   Chronic pain of left knee 08/21/2022   Lumbar radiculopathy 03/25/2022   Cough 12/27/2021   Sleep difficulties 09/27/2021   Cervicogenic headache 04/10/2021   Skin abnormality 02/27/2021   Aortic atherosclerosis (HCC) 09/22/2020   Urinary urgency 03/23/2020   CKD (chronic kidney disease) stage 3, GFR 30-59 ml/min (HCC) 03/22/2020   Prediabetes 03/22/2020   Change in stool 11/17/2019   Right-sided chest wall pain 11/17/2019   Urinary retention 09/23/2019   SOB (shortness of breath) 01/15/2019    Upper airway cough syndrome 01/15/2019   Reactive airway disease 12/05/2018   Tear of left rotator cuff 10/05/2018   Pre-syncope 05/19/2018   DDD (degenerative disc disease), cervical 01/15/2018   Neuralgia 09/12/2017   Presbycusis of both ears 12/11/2016   Memory difficulties 11/06/2016   IBS (irritable bowel syndrome) 04/16/2016   Hyperlipidemia 09/10/2015   Osteoporosis 09/08/2015   Palpitations    Pleural plaque without asbestos 12/03/2010   Chronic neck pain 12/18/2009   Diverticulosis of colon 03/02/2009   Lumbago 03/11/2008   Actinic keratosis 01/23/2008   History of left breast cancer 10/12/2007   Hypothyroidism 06/18/2007   Allergic rhinitis 06/11/2007   Past Medical History:  Diagnosis Date   Actinic keratosis    Allergic rhinitis    BACK PAIN    CARCINOMA, BREAST, 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   L s/p lumpectomy, chemo and XRT   Cervicalgia    Cholelithiasis    DIVERTICULOSIS, COLON    ENDOMETRIOSIS    Heart palpitations    HYPOTHYROIDISM    Left wrist fracture 11/06/2016   10/2016 - fell of high stool   Osteoporosis    Palpitations    Personal history of chemotherapy    Personal history of radiation therapy    Pleural plaque without asbestos    CT chest 10/2010: R>L lobes, upper> lower   Renal disorder    Squamous cell carcinoma    History of BC   Past Surgical History: Past Surgical History:  Procedure Laterality Date   BREAST LUMPECTOMY  2001   left  CATARACT EXTRACTION Bilateral 09/2014   both eyes   CHOLECYSTECTOMY  2010   ENDOMETRIAL ABLATION     EXCISION / BIOPSY BREAST / NIPPLE / DUCT Left 2001   GLUTEUS MINIMUS REPAIR Left 09/09/2023   Procedure: LEFT GLUTEUS MAXIMUS TENDON TRANSFER REPAIR;  Surgeon: Genelle Standing, MD;  Location: [facility];  Service: Orthopedics;  Laterality: Left;   Medication List:  Current Outpatient Medications  Medication Sig Dispense Refill   albuterol  (VENTOLIN  HFA) 108 (90 Base) MCG/ACT  inhaler Inhale 2 puffs into the lungs every 6 (six) hours as needed for wheezing or shortness of breath. 8 g 0   atenolol  (TENORMIN ) 25 MG tablet TAKE 1 TABLET BY MOUTH EVERY DAY 90 tablet 2   Calcium Carb-Cholecalciferol (CALCIUM 600 + D PO) Take by mouth.     chlorpheniramine  (CHLOR-TRIMETON ) 4 MG tablet Take 1 tablet (4 mg total) by mouth 2 (two) times daily as needed for allergies.     cholecalciferol (VITAMIN D ) 25 MCG (1000 UNIT) tablet Take 1,000 Units by mouth daily.     EPINEPHrine  0.3 mg/0.3 mL IJ SOAJ injection Inject 0.3 mg into the muscle as needed for anaphylaxis. 1 each 0   gabapentin  (NEURONTIN ) 100 MG capsule TAKE 1 CAPSULE (100 MG TOTAL) BY MOUTH AT BEDTIME. TAKE 1 TO 3 CAPSULES AT BEDTIME 30 capsule 3   levothyroxine  (SYNTHROID ) 75 MCG tablet TAKE 1 TABLET BY MOUTH EVERY DAY 6 DAYS A WEEK AND 1.5 TABLET PO ONE DAY A WEEK 96 tablet 3   mometasone  (NASONEX ) 50 MCG/ACT nasal spray Place 2 sprays into the nose daily. 1 each 12   Current Facility-Administered Medications  Medication Dose Route Frequency Provider Last Rate Last Admin   denosumab  (PROLIA ) injection 60 mg  60 mg Subcutaneous Once Geofm Glade PARAS, MD       denosumab  (PROLIA ) injection 60 mg  60 mg Subcutaneous Q6 months Geofm, Stacy J, MD   60 mg at 02/02/24 1542   [START ON 08/03/2024] denosumab  (PROLIA ) injection 60 mg  60 mg Subcutaneous Q6 months Geofm Glade PARAS, MD       Allergies: Allergies  Allergen Reactions   Covid-19 (Mrna) Vaccine Anaphylaxis   Molnupiravir  Other (See Comments)    Tongue swelling   Allegra [Fexofenadine]     Didn't agree with Pt    Levaquin  Daylen.Daisy ] Swelling and Other (See Comments)    Hallucinations, swelling of throat   Nexium  [Esomeprazole  Magnesium ]     diarrhea   Augmentin  [Amoxicillin -Pot Clavulanate] Diarrhea   Prednisone      Facial flushing   Valerian Root Other (See Comments)    Loose stools   Social History: Social History   Socioeconomic History   Marital  status: Married    Spouse name: Not on file   Number of children: 2   Years of education: Not on file   Highest education level: Not on file  Occupational History   Occupation: Retired    Comment: Advertising account executive  Tobacco Use   Smoking status: Never    Passive exposure: Never   Smokeless tobacco: Never  Vaping Use   Vaping status: Never Used  Substance and Sexual Activity   Alcohol use: Not Currently   Drug use: No   Sexual activity: Not Currently    Birth control/protection: Post-menopausal  Other Topics Concern   Not on file  Social History Narrative   Married, lives in [place] area since 2008 from [place]  to be close to dtr   Social Drivers  of Health   Financial Resource Strain: Low Risk  (01/02/2024)   Overall Financial Resource Strain (CARDIA)    Difficulty of Paying Living Expenses: Not hard at all  Food Insecurity: No Food Insecurity (01/02/2024)   Hunger Vital Sign    Worried About Running Out of Food in the Last Year: Never true    Ran Out of Food in the Last Year: Never true  Transportation Needs: No Transportation Needs (01/02/2024)   PRAPARE - Administrator, Civil Service (Medical): No    Lack of Transportation (Non-Medical): No  Physical Activity: Insufficiently Active (01/02/2024)   Exercise Vital Sign    Days of Exercise per Week: 2 days    Minutes of Exercise per Session: 40 min  Stress: No Stress Concern Present (01/02/2024)   [employer] of Occupational Health - Occupational Stress Questionnaire    Feeling of Stress : Not at all  Social Connections: Socially Integrated (01/02/2024)   Social Connection and Isolation Panel    Frequency of Communication with Friends and Family: More than three times a week    Frequency of Social Gatherings with Friends and Family: More than three times a week    Attends Religious Services: More than 4 times per year    Active Member of [employer] or Organizations: Yes    Attends Engineer, structural: More  than 4 times per year    Marital Status: Married   Lives in a house. Smoking: denies Occupation: retired  Landscape architect HistorySurveyor, minerals in the house: no Engineer, civil (consulting) in the family room: no Carpet in the bedroom: no Heating: electric Cooling: central Pet: no  Family History: Family History  Problem Relation Age of Onset   Dementia Mother    Heart disease Father    Emphysema Father        smoked and worked with asbestos   Colon cancer Neg Hx    Esophageal cancer Neg Hx    Rectal cancer Neg Hx    Migraines Neg Hx    Headache Neg Hx    Problem                               Relation Asthma                                   no Eczema                                no Food allergy                           no Allergic rhino conjunctivitis     no  Review of Systems  Constitutional:  Negative for appetite change, chills, fever and unexpected weight change.  HENT:  Positive for rhinorrhea. Negative for congestion.   Eyes:  Negative for itching.  Respiratory:  Negative for cough, chest tightness, shortness of breath and wheezing.   Cardiovascular:  Negative for chest pain.  Gastrointestinal:  Negative for abdominal pain.  Genitourinary:  Negative for difficulty urinating.  Skin:  Negative for rash.  Neurological:  Negative for headaches.    Objective: BP 110/82 (BP Location: Right Arm, Patient Position: Sitting, Cuff Size: Normal)   Pulse 88   Temp 97.9 F (36.6 C) (Temporal)  Resp 14   Ht 5' 1 (1.549 m)   Wt 120 lb 12.8 oz (54.8 kg)   SpO2 95%   BMI 22.82 kg/m  Body mass index is 22.82 kg/m. Physical Exam Vitals and nursing note reviewed.  Constitutional:      Appearance: Normal appearance. She is well-developed.  HENT:     Head: Normocephalic and atraumatic.     Right Ear: Tympanic membrane and external ear normal.     Left Ear: Tympanic membrane and external ear normal.     Nose: Nose normal.     Mouth/Throat:     Mouth: Mucous membranes are moist.      Pharynx: Oropharynx is clear.  Eyes:     Conjunctiva/sclera: Conjunctivae normal.  Cardiovascular:     Rate and Rhythm: Normal rate and regular rhythm.     Heart sounds: Normal heart sounds. No murmur heard.    No friction rub. No gallop.  Pulmonary:     Effort: Pulmonary effort is normal.     Breath sounds: Normal breath sounds. No wheezing, rhonchi or rales.  Musculoskeletal:     Cervical back: Neck supple.  Skin:    General: Skin is warm.     Findings: No rash.  Neurological:     Mental Status: She is alert and oriented to person, place, and time.  Psychiatric:        Behavior: Behavior normal.    The plan was reviewed with the patient/family, and all questions/concerned were addressed.  It was my pleasure to see Emily [name] today and participate in her care. Please feel free to contact me with any questions or concerns.  Sincerely,  Orlan Cramp, DO Allergy  & Immunology  [facility]  [place] office: [phone] [facility] office: [phone]

## 2024-05-12 ENCOUNTER — Telehealth: Payer: Self-pay | Admitting: Allergy

## 2024-05-12 ENCOUNTER — Encounter: Payer: Self-pay | Admitting: Allergy

## 2024-05-12 ENCOUNTER — Ambulatory Visit (INDEPENDENT_AMBULATORY_CARE_PROVIDER_SITE_OTHER): Admitting: Allergy

## 2024-05-12 DIAGNOSIS — J310 Chronic rhinitis: Secondary | ICD-10-CM

## 2024-05-12 DIAGNOSIS — J4520 Mild intermittent asthma, uncomplicated: Secondary | ICD-10-CM

## 2024-05-12 MED ORDER — IPRATROPIUM BROMIDE 0.06 % NA SOLN: 1.0000 | Freq: Three times a day (TID) | NASAL | 3 refills | Status: DC | PRN

## 2024-05-12 NOTE — Telephone Encounter (Signed)
 Please refer to ENT for chronic non-allergic rhinorrhea. Is she candidate for cryotherapy?

## 2024-05-12 NOTE — Patient Instructions (Addendum)
 Today's skin testing negative to indoor/outdoor allergens.   Results given.  Rhinorrhea  Use Atrovent  (ipratropium) 0.06% 1-2 sprays per nostril three times a day as needed for runny nose/drainage. Nasal saline spray (i.e., Simply Saline) or nasal saline lavage (i.e., NeilMed) is recommended as needed and prior to medicated nasal sprays. Refer to ENT for possible nasal cryotherapy.   Breathing Monitor symptoms. May use albuterol  rescue inhaler 2 puffs every 4 to 6 hours as needed for shortness of breath, chest tightness, coughing, and wheezing.  Monitor frequency of use - if you need to use it more than twice per week on a consistent basis let us  know.   Return if symptoms worsen or fail to improve.

## 2024-05-12 NOTE — Telephone Encounter (Signed)
 Emily [name] has been internally referred to [facility].  They will reach out to the patient to schedule.  I will follow up in a week.

## 2024-05-12 NOTE — Progress Notes (Signed)
 Skin testing note  RE: Emily [name] MRN: [id number] DOB: Sep 11, 1940 Date of Office Visit: 05/12/2024  Referring provider: Geofm Glade PARAS, MD Primary care provider: Geofm Glade PARAS, MD  Chief Complaint: skin testing  History of Present Illness: I had the pleasure of seeing Emily [name] for a skin testing visit at the [facility] on 05/12/2024. She is a 83 y.o. female, who is being followed for allergic rhinitis and reactive airway disease. Her previous allergy  office visit was on 04/30/2024 with Dr. Luke. Today is a skin testing visit.   Discussed the use of AI scribe software for clinical note transcription with the patient, who gave verbal consent to proceed.    She experiences ongoing nasal drainage despite trying various nasal sprays, which have not provided relief. Her symptoms include significant drippage.  She has previously used nasal sprays but has not tried ipratropium at a higher dose. Her symptoms are primarily drainage rather than congestion, and she confirms having more drainage than congestion.     Assessment and Plan: [name] is a 83 y.o. female with: Chronic rhinitis Past history - Chronic runny nose for 17-18 years, worse in the morning. Symptoms perennial, not associated with sneezing, itchy nose, or headaches. Current treatment with mometasone  nasal spray provides limited relief. Tried azelastine  and Atrovent  0.03% in the past.  Today's skin testing negative to indoor/outdoor allergens.  Use Atrovent  (ipratropium) 0.06% 1-2 sprays per nostril three times a day as needed for runny nose/drainage. Nasal saline spray (i.e., Simply Saline) or nasal saline lavage (i.e., NeilMed) is recommended as needed and prior to medicated nasal sprays. Refer to ENT for possible nasal cryotherapy.    Mild intermittent reactive airway disease Past history - recent episode of difficulty breathing with wheezing, improved with antibiotics and inhaler use. 2025 spirometry  showed restrictive disease with no improvement in FEV1 post bronchodilator treatment. Clinically feeling unchanged.  Monitor symptoms. May use albuterol  rescue inhaler 2 puffs every 4 to 6 hours as needed for shortness of breath, chest tightness, coughing, and wheezing.  Monitor frequency of use - if you need to use it more than twice per week on a consistent basis let us  know.     Return if symptoms worsen or fail to improve.  Meds ordered this encounter  Medications   ipratropium (ATROVENT ) 0.06 % nasal spray    Sig: Place 1-2 sprays into both nostrils 3 (three) times daily as needed (runny nose/drainage).    Dispense:  15 mL    Refill:  3   Lab Orders  No laboratory test(s) ordered today    Diagnostics: Skin Testing: Environmental allergy  panel. Today's skin testing negative to indoor/outdoor allergens.  Results discussed with patient/family.  Airborne Adult Perc - 05/12/24 0905     Time Antigen Placed 9094    Allergen Manufacturer Jestine    Location Back    Number of Test 55    1. Control-Buffer 50% Glycerol Negative    2. Control-Histamine 3+    3. Bahia Negative    4. French Southern Territories Negative    5. Johnson Negative    6. Kentucky  Blue Negative    7. Meadow Fescue Negative    8. Perennial Rye Negative    9. Timothy Negative    10. Ragweed Mix Negative    11. Cocklebur Negative    12. Plantain,  English Negative    13. Baccharis Negative    14. Dog Fennel Negative    15. Guernsey Thistle Negative  16. [name]'s Quarters Negative    17. Sheep Sorrell Negative    18. Rough Pigweed Negative    19. Marsh Elder, Rough Negative    20. Mugwort, Common Negative    21. Box, Elder Negative    22. Cedar, red Negative    23. Sweet Gum Negative    24. Pecan Pollen Negative    25. Pine Mix Negative    26. Walnut, Black Pollen Negative    27. Red Mulberry Negative    28. Ash Mix Negative    29. Birch Mix Negative    30. Beech American Negative    31. Cottonwood, Guinea-Bissau Negative     32. Hickory, White Negative    33. Maple Mix Negative    34. Oak, Guinea-Bissau Mix Negative    35. Sycamore Eastern Negative    36. Alternaria Alternata Negative    37. Cladosporium Herbarum Negative    38. Aspergillus Mix Negative    39. Penicillium Mix Negative    40. Bipolaris Sorokiniana (Helminthosporium) Negative    41. Drechslera Spicifera (Curvularia) Negative    42. Mucor Plumbeus Negative    43. Fusarium Moniliforme Negative    44. Aureobasidium Pullulans (pullulara) Negative    45. Rhizopus Oryzae Negative    46. Botrytis Cinera Negative    47. Epicoccum Nigrum Negative    48. Phoma Betae Negative    49. Dust Mite Mix Negative    50. Cat Hair 10,000 BAU/ml Negative    51.  Dog Epithelia Negative    52. Mixed Feathers Negative    53. Horse Epithelia Negative    54. Cockroach, German Negative    55. Tobacco Leaf Negative          Intradermal - 05/12/24 1012     Time Antigen Placed 1012    Allergen Manufacturer Greer    Location Back    Number of Test 16    Control Negative    Bahia Negative    French Southern Territories Negative    Johnson Negative    7 Grass Negative    Ragweed Mix Negative    Weed Mix Negative    Tree Mix Negative    Mold 1 Negative    Mold 2 Negative    Mold 3 Negative    Mold 4 Negative    Mite Mix Negative    Cat Negative    Dog Negative    Cockroach Negative          Previous notes and tests were reviewed. The plan was reviewed with the patient/family, and all questions/concerned were addressed.  It was my pleasure to see [name] today and participate in her care. Please feel free to contact me with any questions or concerns.  Sincerely,  Orlan Cramp, DO Allergy  & Immunology  [facility]  [place] office: [phone] [facility] office: [phone]

## 2024-05-17 ENCOUNTER — Encounter (INDEPENDENT_AMBULATORY_CARE_PROVIDER_SITE_OTHER): Payer: Self-pay

## 2024-05-20 DIAGNOSIS — L570 Actinic keratosis: Secondary | ICD-10-CM | POA: Diagnosis not present

## 2024-05-26 ENCOUNTER — Encounter (INDEPENDENT_AMBULATORY_CARE_PROVIDER_SITE_OTHER): Payer: Self-pay

## 2024-05-31 NOTE — Telephone Encounter (Signed)
 [facility] closed out Anne's referral Patient Refused  Arlean stated she did not wish to schedule with ENT.

## 2024-06-21 ENCOUNTER — Encounter: Payer: Self-pay | Admitting: Radiology

## 2024-07-02 ENCOUNTER — Telehealth: Payer: Self-pay | Admitting: Cardiology

## 2024-07-02 NOTE — Telephone Encounter (Signed)
 Discussed with Dr Lonni who recommended patient stay hydrated continue to monitor and call back if any changes   Called patient back and she stated she was feeling better She is concerned with her headaches, advised to follow up with whoever treats her headaches  Patient verbalized understanding

## 2024-07-02 NOTE — Telephone Encounter (Signed)
 Pt c/o Syncope: STAT if syncope occurred within 24 hours and pt complains of lightheadedness  Did you pass out today? No    When is the last time you passed out? Last night    Has this occurred multiple times? One time    Did you have any symptoms prior to passing out? Dizziness    5. Did you fall? If so, are you on a blood thinner? Yes, no    Pt reports bad headache since 3 am this morning

## 2024-07-02 NOTE — Telephone Encounter (Signed)
 Spoke with patient regarding episode yesterday  Stated head felt strange and things went black, felt she was going to pass out Never fell to ground but went out for few seconds Same episode that she had several years ago prior to seeing Dr Lonni  Blood pressure was 140/94  Did have headache, took Tylenol  and Gabapentin  which she takes for headaches  Episode happened around 4-5 pm  Wednesday was a very strenuous day for her Up early yesterday to take husband to [place] for eye appointment  Did eat however does not think fluid intake was good  Woke up around 3 am with bad headache Has headaches daily but head just doesn't feel right  Asked patient to check blood pressure and would call back   Called back about 15-20 minutes later, blood pressure first time was 133/94 but came down to 117/72 and was feeling some better   Will forward to Dr Lonni for review

## 2024-07-05 ENCOUNTER — Telehealth: Payer: Self-pay

## 2024-07-05 ENCOUNTER — Other Ambulatory Visit (HOSPITAL_COMMUNITY): Payer: Self-pay

## 2024-07-05 NOTE — Telephone Encounter (Signed)
 Pt ready for scheduling for PROLIA  on or after : 08/03/24  Option# 1: Buy/Bill (Office supplied medication)  Out-of-pocket cost due at time of clinic visit: $357  Number of injection/visits approved: ---  Primary: [employer] Prolia  co-insurance: 20% Admin fee co-insurance: 20%  Secondary: --- Prolia  co-insurance:  Admin fee co-insurance:   Medical Benefit Details: Date Benefits were checked: 07/05/24 Deductible: NO/ Coinsurance: 20%/ Admin Fee: 20%  Prior Auth: N/A PA# Expiration Date:   # of doses approved: ----------------------------------------------------------------------- Option# 2- Med Obtained from pharmacy: Prolia  is no longer preferred for pharmacy benefit. Jubbonti is now preferred. PRICING IS FOR JUBBONTI  Pharmacy benefit: Copay $250 (Paid to pharmacy) Admin Fee: 20% (Pay at clinic)  Prior Auth: N/A PA# Expiration Date:   # of doses approved:   If patient wants fill through the pharmacy benefit please send prescription to: [facility], and include estimated need by date in rx notes. Pharmacy will ship medication directly to the office.  Patient NOT eligible for Prolia  Copay Card. Copay Card can make patient's cost as little as $25. Link to apply: [email or web address]://[email or web address]  ** This summary of benefits is an estimation of the patient's out-of-pocket cost. Exact cost may very based on individual plan coverage.

## 2024-07-05 NOTE — Telephone Encounter (Signed)
 SABRA

## 2024-07-05 NOTE — Telephone Encounter (Signed)
 Prolia  VOB initiated via [email or web address]  Next Prolia  inj DUE: 08/03/24

## 2024-07-06 ENCOUNTER — Ambulatory Visit: Admitting: Internal Medicine

## 2024-07-06 ENCOUNTER — Other Ambulatory Visit (HOSPITAL_BASED_OUTPATIENT_CLINIC_OR_DEPARTMENT_OTHER): Payer: Self-pay

## 2024-07-06 ENCOUNTER — Telehealth: Payer: Self-pay | Admitting: Internal Medicine

## 2024-07-06 ENCOUNTER — Ambulatory Visit (INDEPENDENT_AMBULATORY_CARE_PROVIDER_SITE_OTHER): Admitting: Student

## 2024-07-06 ENCOUNTER — Ambulatory Visit: Payer: Self-pay | Admitting: *Deleted

## 2024-07-06 ENCOUNTER — Ambulatory Visit (INDEPENDENT_AMBULATORY_CARE_PROVIDER_SITE_OTHER)

## 2024-07-06 DIAGNOSIS — G8929 Other chronic pain: Secondary | ICD-10-CM | POA: Diagnosis not present

## 2024-07-06 DIAGNOSIS — M5441 Lumbago with sciatica, right side: Secondary | ICD-10-CM

## 2024-07-06 DIAGNOSIS — M25551 Pain in right hip: Secondary | ICD-10-CM

## 2024-07-06 MED ORDER — MELOXICAM 15 MG PO TABS
15.0000 mg | ORAL_TABLET | Freq: Every day | ORAL | 0 refills | Status: AC
  Filled 2024-07-06: qty 14, 14d supply, fill #0

## 2024-07-06 NOTE — Telephone Encounter (Signed)
 FYI Only or Action Required?: Action required by provider: Patient needs to reschedule appointment today .  Patient was last seen in primary care on 04/12/2024 by Geofm Glade PARAS, MD.  Called Nurse Triage reporting Back Pain.  Symptoms began yesterday.  Interventions attempted: Rest, hydration, or home remedies.  Symptoms are: gradually worsening.  Triage Disposition: See HCP Within 4 Hours (Or PCP Triage)  Patient/caregiver understands and will follow disposition?: Yes  Copied from CRM #[id number]. Topic: Clinical - Red Word Triage >> Jul 06, 2024  8:36 AM Harlene ORN wrote: Red Word that prompted transfer to Nurse Triage: 7 out of 10 pain in her backside. Pain is worse when she's walking. Is already scheduled for an appointment on 3:40 pm Reason for Disposition  [1] SEVERE back pain (e.g., excruciating, unable to do any normal activities) AND [2] not improved 2 hours after pain medicine  Answer Assessment - Initial Assessment Questions Patient has to reschedule appointment today- has to see Ortho this afternoon- would like carla to call her back -wants to come tomorrow  1. ONSET: When did the pain begin? (e.g., minutes, hours, days)     R spinal pain into the gluteal 2. LOCATION: Where does it hurt? (upper, mid or lower back)     Into the gluteal 3. SEVERITY: How bad is the pain?  (e.g., Scale 1-10; mild, moderate, or severe)     7/10 4. PATTERN: Is the pain constant? (e.g., yes, no; constant, intermittent)      Sitting- R leg feels numb- walking -has to use cane- very painful 5. RADIATION: Does the pain shoot into your legs or somewhere else?     Into the leg- R  9. NEUROLOGIC SYMPTOMS: Do you have any weakness, numbness, or problems with bowel/bladder control?     numbness Patient has appointment with Ortho this afternoon- needs to change appointment with PCP.  Protocols used: Back Pain-A-AH

## 2024-07-06 NOTE — Telephone Encounter (Signed)
 Pt Called today 07/06/24 and asked for their upcoming appointment to be cancelled and asked for Burn's Nurse to call back at her earliest convenience.   DPO 07/06/24

## 2024-07-06 NOTE — Progress Notes (Signed)
 Chief Complaint: Right hip pain    Discussed the use of AI scribe software for clinical note transcription with the patient, who gave verbal consent to proceed.  History of Present Illness Emily [name] is an 83 year old female with osteoporosis and degenerative disc disease who presents with right hip pain.  She experiences significant right hip pain since Sunday, rated at 7 out of 10 in severity. The pain is more intense than before her left hip surgery in January. It radiates from the buttock down the back of her leg, nearly reaching the knee. Numbness occurs when sitting but not when standing or moving. There have been no recent falls or injuries. She has osteoporosis and receives Prolia  injections biannually for the past two to three years. She uses ice, heat, gabapentin , ibuprofen , and extra strength Tylenol  for pain management, but these have been ineffective. She has degenerative disc disease with previous low back pain.  She last had radiographs of the low back 5 months ago.  She underwent six months of physical therapy following left gluteus maximus tendon transfer in January.   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   L s/p lumpectomy, chemo and XRT   Cervicalgia    Cholelithiasis    DIVERTICULOSIS, COLON    ENDOMETRIOSIS    Heart palpitations    HYPOTHYROIDISM    Left wrist fracture 11/06/2016   10/2016 - fell of high stool   Osteoporosis    Palpitations    Personal history of chemotherapy    Personal history of radiation therapy    Pleural plaque without asbestos    CT chest 10/2010: R>L lobes, upper> lower   Renal disorder    Squamous cell carcinoma    History of BC   Past Surgical History:   Procedure Laterality Date   BREAST LUMPECTOMY  2001   left   CATARACT EXTRACTION Bilateral 09/2014   both eyes   CHOLECYSTECTOMY  2010   ENDOMETRIAL ABLATION     EXCISION / BIOPSY BREAST / NIPPLE / DUCT Left 2001   GLUTEUS MINIMUS REPAIR Left 09/09/2023   Procedure: LEFT GLUTEUS MAXIMUS TENDON TRANSFER REPAIR;  Surgeon: Genelle Standing, MD;  Location: [facility];  Service: Orthopedics;  Laterality: Left;   Social History   Socioeconomic History   Marital status: Married    Spouse name: Not on file   Number of children: 2   Years of education: Not on file   Highest education level: Not on file  Occupational History   Occupation: Retired    Comment: advertising account executive  Tobacco Use   Smoking status: Never    Passive exposure: Never   Smokeless tobacco: Never  Vaping Use   Vaping status: Never Used  Substance and Sexual Activity   Alcohol use: Not Currently   Drug use: No   Sexual activity: Not Currently    Birth control/protection: Post-menopausal  Other Topics Concern   Not on file  Social History Narrative   Married, lives in [place] area since 2008 from [place]  to be close to dtr  Social Drivers of Corporate Investment Banker Strain: Low Risk  (01/02/2024)   Overall Financial Resource Strain (CARDIA)    Difficulty of Paying Living Expenses: Not hard at all  Food Insecurity: No Food Insecurity (01/02/2024)   Hunger Vital Sign    Worried About Running Out of Food in the Last Year: Never true    Ran Out of Food in the Last Year: Never true  Transportation Needs: No Transportation Needs (01/02/2024)   PRAPARE - Administrator, Civil Service (Medical): No    Lack of Transportation (Non-Medical): No  Physical Activity: Insufficiently Active (01/02/2024)   Exercise Vital Sign    Days of Exercise per Week: 2 days    Minutes of Exercise per Session: 40 min  Stress: No Stress Concern Present (01/02/2024)   [employer] of Occupational Health -  Occupational Stress Questionnaire    Feeling of Stress : Not at all  Social Connections: Socially Integrated (01/02/2024)   Social Connection and Isolation Panel    Frequency of Communication with Friends and Family: More than three times a week    Frequency of Social Gatherings with Friends and Family: More than three times a week    Attends Religious Services: More than 4 times per year    Active Member of [employer] or Organizations: Yes    Attends Engineer, Structural: More than 4 times per year    Marital Status: Married   Family History  Problem Relation Age of Onset   Dementia Mother    Heart disease Father    Emphysema Father        smoked and worked with asbestos   Colon cancer Neg Hx    Esophageal cancer Neg Hx    Rectal cancer Neg Hx    Migraines Neg Hx    Headache Neg Hx    Allergies  Allergen Reactions   Covid-19 (Mrna) Vaccine Anaphylaxis   Molnupiravir  Other (See Comments)    Tongue swelling   Allegra [Fexofenadine]     Didn't agree with Pt    Levaquin  [Levofloxacin ] Swelling and Other (See Comments)    Hallucinations, swelling of throat   Nexium  [Esomeprazole  Magnesium ]     diarrhea   Augmentin  [Amoxicillin -Pot Clavulanate] Diarrhea   Prednisone      Facial flushing   Valerian Root Other (See Comments)    Loose stools   Current Outpatient Medications  Medication Sig Dispense Refill   meloxicam (MOBIC) 15 MG tablet Take 1 tablet (15 mg total) by mouth daily for 14 days. 14 tablet 0   albuterol  (VENTOLIN  HFA) 108 (90 Base) MCG/ACT inhaler Inhale 2 puffs into the lungs every 6 (six) hours as needed for wheezing or shortness of breath. 8 g 0   atenolol  (TENORMIN ) 25 MG tablet TAKE 1 TABLET BY MOUTH EVERY DAY 90 tablet 2   Calcium Carb-Cholecalciferol (CALCIUM 600 + D PO) Take by mouth.     chlorpheniramine  (CHLOR-TRIMETON ) 4 MG tablet Take 1 tablet (4 mg total) by mouth 2 (two) times daily as needed for allergies.     cholecalciferol (VITAMIN D ) 25 MCG  (1000 UNIT) tablet Take 1,000 Units by mouth daily.     EPINEPHrine  0.3 mg/0.3 mL IJ SOAJ injection Inject 0.3 mg into the muscle as needed for anaphylaxis. 1 each 0   gabapentin  (NEURONTIN ) 100 MG capsule TAKE 1 CAPSULE (100 MG TOTAL) BY MOUTH AT BEDTIME. TAKE 1 TO 3 CAPSULES AT BEDTIME 30 capsule 3   ipratropium (ATROVENT ) 0.06 %  nasal spray Place 1-2 sprays into both nostrils 3 (three) times daily as needed (runny nose/drainage). 15 mL 3   levothyroxine  (SYNTHROID ) 75 MCG tablet TAKE 1 TABLET BY MOUTH EVERY DAY 6 DAYS A WEEK AND 1.5 TABLET PO ONE DAY A WEEK 96 tablet 3   mometasone  (NASONEX ) 50 MCG/ACT nasal spray Place 2 sprays into the nose daily. 1 each 12   Current Facility-Administered Medications  Medication Dose Route Frequency Provider Last Rate Last Admin   denosumab  (PROLIA ) injection 60 mg  60 mg Subcutaneous Once Geofm Glade PARAS, MD       denosumab  (PROLIA ) injection 60 mg  60 mg Subcutaneous Q6 months Burns, Stacy J, MD   60 mg at 02/02/24 1542   [START ON 08/03/2024] denosumab  (PROLIA ) injection 60 mg  60 mg Subcutaneous Q6 months Burns, Glade PARAS, MD       No results found.  Review of Systems:   A ROS was performed including pertinent positives and negatives as documented in the HPI.  Physical Exam :   Constitutional: NAD and appears stated age Neurological: Alert and oriented Psych: Appropriate affect and cooperative There were no vitals taken for this visit.   Comprehensive Musculoskeletal Exam:    Tenderness with palpation over the lower midline of the lumbar spine extending into the right lower lumbar musculature.  Fluid passive right hip range of motion to 120 degrees flexion, 30 degrees external rotation, 20 degrees internal rotation.  Right knee flexion/extension and ankle dorsiflexion/plantarflexion strength is 5/5.  Mild tenderness with palpation of the greater trochanter.  Imaging:   Xray (AP pelvis, right hip 3 views): Negative for acute fracture or  dislocation.  Hip joint spaces are well-maintained with minimal degenerative changes.   I personally reviewed and interpreted the radiographs.      Assessment & Plan Right-sided sciatica   Acute right-sided sciatica presents with pain radiating from the buttock to the knee, worsened by standing and walking, and numbness when sitting.  Current symptoms suggest sciatica over gluteal tendinopathy or bursitis. X-rays of the hip show good joint space without significant arthritis and she has mild degenerative changes in the lumbar spine seen on previous x-rays. No red flag symptoms or weakness.. Prescribe meloxicam 15 mg once daily for 14 days. Provide home exercises to alleviate nerve irritation. Refer to physical therapy for symptom management and rehab. Advise monitoring for worsening symptoms such as increased weakness or numbness.       I personally saw and evaluated the patient, and participated in the management and treatment plan.  Leonce Reveal, PA-C Orthopedics

## 2024-07-08 ENCOUNTER — Emergency Department (HOSPITAL_COMMUNITY)

## 2024-07-08 ENCOUNTER — Other Ambulatory Visit: Payer: Self-pay

## 2024-07-08 ENCOUNTER — Telehealth: Payer: Self-pay

## 2024-07-08 ENCOUNTER — Ambulatory Visit: Payer: Self-pay

## 2024-07-08 ENCOUNTER — Telehealth: Payer: Self-pay | Admitting: Orthopaedic Surgery

## 2024-07-08 ENCOUNTER — Emergency Department (HOSPITAL_COMMUNITY): Admission: EM | Admit: 2024-07-08 | Discharge: 2024-07-09 | Disposition: A | Discharge status: Home / Self Care

## 2024-07-08 DIAGNOSIS — R519 Headache, unspecified: Secondary | ICD-10-CM

## 2024-07-08 DIAGNOSIS — I10 Essential (primary) hypertension: Secondary | ICD-10-CM | POA: Insufficient documentation

## 2024-07-08 DIAGNOSIS — Z79899 Other long term (current) drug therapy: Secondary | ICD-10-CM | POA: Diagnosis not present

## 2024-07-08 LAB — URINALYSIS, ROUTINE W REFLEX MICROSCOPIC
Bilirubin Urine: NEGATIVE
Glucose, UA: NEGATIVE mg/dL
Hgb urine dipstick: NEGATIVE
Ketones, ur: NEGATIVE mg/dL
Nitrite: NEGATIVE
Protein, ur: NEGATIVE mg/dL
Specific Gravity, Urine: 1.01 (ref 1.005–1.030)
pH: 5 (ref 5.0–8.0)

## 2024-07-08 LAB — CBC WITH DIFFERENTIAL/PLATELET
Abs Immature Granulocytes: 0.02 K/uL (ref 0.00–0.07)
Basophils Absolute: 0.1 K/uL (ref 0.0–0.1)
Basophils Relative: 1 %
Eosinophils Absolute: 0.4 K/uL (ref 0.0–0.5)
Eosinophils Relative: 5 %
HCT: 45.2 % (ref 36.0–46.0)
Hemoglobin: 14.6 g/dL (ref 12.0–15.0)
Immature Granulocytes: 0 %
Lymphocytes Relative: 31 %
Lymphs Abs: 2.2 K/uL (ref 0.7–4.0)
MCH: 29.1 pg (ref 26.0–34.0)
MCHC: 32.3 g/dL (ref 30.0–36.0)
MCV: 90 fL (ref 80.0–100.0)
Monocytes Absolute: 0.6 K/uL (ref 0.1–1.0)
Monocytes Relative: 9 %
Neutro Abs: 3.9 K/uL (ref 1.7–7.7)
Neutrophils Relative %: 54 %
Platelets: 211 K/uL (ref 150–400)
RBC: 5.02 MIL/uL (ref 3.87–5.11)
RDW: 13.2 % (ref 11.5–15.5)
WBC: 7.2 K/uL (ref 4.0–10.5)
nRBC: 0 % (ref 0.0–0.2)

## 2024-07-08 LAB — BASIC METABOLIC PANEL WITH GFR
Anion gap: 10 (ref 5–15)
BUN: 18 mg/dL (ref 8–23)
CO2: 25 mmol/L (ref 22–32)
Calcium: 9.7 mg/dL (ref 8.9–10.3)
Chloride: 102 mmol/L (ref 98–111)
Creatinine, Ser: 0.87 mg/dL (ref 0.44–1.00)
GFR, Estimated: >60 mL/min (ref >60)
Glucose, Bld: 96 mg/dL (ref 70–99)
Potassium: 4.1 mmol/L (ref 3.5–5.1)
Sodium: 137 mmol/L (ref 135–145)

## 2024-07-08 MED ORDER — ACETAMINOPHEN 500 MG PO TABS
1000.0000 mg | ORAL_TABLET | Freq: Once | ORAL | Status: AC
  Administered 2024-07-08: 1000 mg via ORAL
  Filled 2024-07-08: qty 2

## 2024-07-08 MED ORDER — AMLODIPINE BESYLATE 5 MG PO TABS
2.5000 mg | ORAL_TABLET | Freq: Once | ORAL | Status: AC
  Administered 2024-07-08: 2.5 mg via ORAL
  Filled 2024-07-08: qty 1

## 2024-07-08 NOTE — Discharge Instructions (Addendum)
 Bilateral choroid plexus xanthogranulomas, incidental benign finding.   These hardly ever cause symptoms.  Sometimes they can cause headaches if they get big enough but this can be followed up with neurology outpatient.  There is nothing to do for this at this time.  Give neurology call to establish care   Follow-up with your primary care physician.  They will help start you on blood pressure medication.  If anything changes come back to the ED.

## 2024-07-08 NOTE — Telephone Encounter (Signed)
 Copied from CRM #[id number]. Topic: General - Call Back - No Documentation >> Jul 08, 2024  2:32 PM Emily [name] wrote: Reason for CRM: Patient returning karla call back but patient wanted to let her know that she is in the hospital so she might can't answer if she tries to call again but she does have an appointment tomorrow with Dr.Burns and will talk to her then.

## 2024-07-08 NOTE — ED Triage Notes (Addendum)
 Pt. Stated, my BP was up this morning and my head felt like it was going to explode. No other symptoms  per pt. I do have pain in my sciatica and take Maloxican

## 2024-07-08 NOTE — Telephone Encounter (Signed)
 FYI Only or Action Required?: FYI only for provider: ED advised.  Patient was last seen in primary care on 04/12/2024 by Emily Emily PARAS, Emily [name].  Called Nurse Triage reporting Hypertension, Back Pain, and Headache.  Symptoms began several days ago.  Interventions attempted: Prescription medications: Atenolol , meloxicam.  Symptoms are: gradually worsening.  Triage Disposition: Go to ED Now (Notify PCP)  Patient/caregiver understands and will follow disposition?: Yes  Copied from CRM [id number]. Topic: Clinical - Red Word Triage >> Jul 08, 2024 11:11 AM Emily [name] wrote: [facility] that prompted transfer to Nurse Triage: Emily [name], Emily [name], said Emily [name] is experiencing high BP 176/100 sciatic pain, lightheadness, and headache. Emily [name] was recently prescribed Meloxicam and is unsiure if that's contributing to her elevated BP. Warm transfer to NT Reason for Disposition  [1] Systolic BP >= 160 OR Diastolic >= 100 AND [2] cardiac (e.g., breathing difficulty, chest pain) or neurologic symptoms (e.g., new-onset blurred or double vision, unsteady gait)  Answer Assessment - Initial Assessment Questions Spoke with pts Emily [name] Emily (on [place]) and Emily [name]. Emily [name] reports onset on increased/fluctuating BP over the weekend and worsening back pain over the past week. Dx with sciatica after seeing othro provider on Tuesday and prescribed meloxicam. Emily [name] concerned it may be causing her BP issues. BP currently 200/123 HR 57, 5 minutes later BP 181/117 HR 61. Denies CP, SOB, weakness or blurry vision. Reports mild lightheadedness and 6/10 headache. BP normally 117/87, denies hx of htn. Takes atenolol  for palpitations, has not missed any doses. Advised ED, reports her Emily [name] can drive her there now. Advised 911 for worsening symptoms.   1. BLOOD PRESSURE: What is your blood pressure? Did you take at least two measurements 5 minutes apart?     BP 200/123 HR 57, BP 181/117 HR 61  2. ONSET: When did you take your blood  pressure?     Emily [name] normally with BP around 117/87. Has been fluctuating since this week, highest was 176/100. Rechecked BP over the phone with this RN x2.  3. HOW: How did you take your blood pressure? (e.g., automatic home BP monitor, visiting nurse)     Home arm cuff  4. HISTORY: Do you have a history of high blood pressure?     Denies  5. MEDICINES: Are you taking any medicines for blood pressure? Have you missed any doses recently?     Atenolol  for palpitations, has not missed any doses.  6. OTHER SYMPTOMS: Do you have any symptoms? (e.g., blurred vision, chest pain, difficulty breathing, headache, weakness)    Headache 6/10 pain. Denies CP, SOB, blurry vision, or weakness. Reports mild lightheadedness currently. 11/14 had episode where her vision went and had a severe headache for a few seconds while standing. Did not pass out. Communicated with cardiology on 11/14 via mychart, advised to stay hydrated.  Protocols used: Blood Pressure - High-A-AH

## 2024-07-08 NOTE — ED Provider Triage Note (Signed)
 Emergency Medicine Provider Triage Evaluation Note  Emily [name] , a 83 y.o. female  was evaluated in triage.  Pt complains of HTN, HA.  Takes atenolol  for heart arrhythmia  Review of Systems  Positive: Hypertension, headache Negative: Nausea, vomiting, chest pain, shortness of breath, changes in urination, diplopia, tinnitus, numbness, weakness  Physical Exam  BP (!) 181/112 (BP Location: Left Arm)   Pulse (!) 57   Temp (!) 97.5 F (36.4 C)   Resp 18   SpO2 100%  Gen:   Awake, no distress   Resp:  Normal effort  MSK:   Moves extremities without difficulty  Other:    Medical Decision Making  Medically screening exam initiated at 1:31 PM.  Appropriate orders placed.  Emily [name] was informed that the remainder of the evaluation will be completed by another provider, this initial triage assessment does not replace that evaluation, and the importance of remaining in the ED until their evaluation is complete.  Labs and imaging ordered   Emily [name] 07/08/24 1332

## 2024-07-08 NOTE — Telephone Encounter (Signed)
 Pt's husband called saying that after his wife took 1 pill of the Miloxicane 15mg  last night she was fine. But when she took one this morning with breakfast she was feeling feeling shaky. He is wondering if this is a side effect of the medication. Call back number is [phone].

## 2024-07-08 NOTE — ED Provider Notes (Signed)
 [place] EMERGENCY DEPARTMENT AT [facility] Provider Note   CSN: [id number] Arrival date & time: 07/08/24  1205     Patient presents with: Hypertension and Headache   Emily [name] is a 83 y.o. female.    Hypertension Associated symptoms include headaches.  Headache   Patient presents with a headache.  Chronic headaches.  Has been told show he has occipital neuralgia.  Has been follow-up with neurology in the past but needs new neurologist.  Took blood pressure this morning, elevated.  Patient states that she has no history of elevated blood pressure in the past.  Takes atenolol  in setting of possible arrhythmia in the past.  Patient denies any blurry vision.  Numbness or tingling.  No dysarthria or aphasia.  Otherwise feels completely fine.  No chest pain or shortness of breath.  Has appointment PCP tomorrow morning.   Previous medical history reviewed : Patient last follow-up with PCP in August 2025.  Currently taking atenolol  25 mg once daily.     Prior to Admission medications   Medication Sig Start Date End Date Taking? Authorizing Provider  albuterol  (VENTOLIN  HFA) 108 (90 Base) MCG/ACT inhaler Inhale 2 puffs into the lungs every 6 (six) hours as needed for wheezing or shortness of breath. 04/12/24   Burns, Glade PARAS, MD  atenolol  (TENORMIN ) 25 MG tablet TAKE 1 TABLET BY MOUTH EVERY DAY 11/03/23   Geofm Glade PARAS, MD  Calcium Carb-Cholecalciferol (CALCIUM 600 + D PO) Take by mouth.    [provider]  chlorpheniramine  (CHLOR-TRIMETON ) 4 MG tablet Take 1 tablet (4 mg total) by mouth 2 (two) times daily as needed for allergies. 10/31/23   Geofm Glade PARAS, MD  cholecalciferol (VITAMIN D ) 25 MCG (1000 UNIT) tablet Take 1,000 Units by mouth daily.    [provider]  EPINEPHrine  0.3 mg/0.3 mL IJ SOAJ injection Inject 0.3 mg into the muscle as needed for anaphylaxis. 05/21/21   Horton, Charmaine FALCON, MD  gabapentin  (NEURONTIN ) 100 MG capsule TAKE 1 CAPSULE  (100 MG TOTAL) BY MOUTH AT BEDTIME. TAKE 1 TO 3 CAPSULES AT BEDTIME 07/26/23   Corey, Evan S, MD  ipratropium (ATROVENT ) 0.06 % nasal spray Place 1-2 sprays into both nostrils 3 (three) times daily as needed (runny nose/drainage). 05/12/24   Luke Orlan HERO, DO  levothyroxine  (SYNTHROID ) 75 MCG tablet TAKE 1 TABLET BY MOUTH EVERY DAY 6 DAYS A WEEK AND 1.5 TABLET PO ONE DAY A WEEK 11/02/23   Burns, Glade PARAS, MD  meloxicam  (MOBIC ) 15 MG tablet Take 1 tablet (15 mg total) by mouth daily for 14 days. 07/06/24 07/20/24  Emiliano Leonce CROME, PA-C  mometasone  (NASONEX ) 50 MCG/ACT nasal spray Place 2 sprays into the nose daily. 04/05/24   Geofm Glade PARAS, MD    Allergies: Covid-19 (mrna) vaccine, Molnupiravir , Allegra [fexofenadine], Levaquin  [levofloxacin ], Nexium  [esomeprazole  magnesium ], Augmentin  [amoxicillin -pot clavulanate], Prednisone , and Valerian root    Review of Systems  Neurological:  Positive for headaches.    Updated Vital Signs BP (!) 180/97   Pulse (!) 50   Temp 97.8 F (36.6 C) (Oral)   Resp 17   Ht 5' (1.524 m)   Wt 54.4 kg   SpO2 96%   BMI 23.44 kg/m   Physical Exam  (all labs ordered are listed, but only abnormal results are displayed) Labs Reviewed  URINALYSIS, ROUTINE W REFLEX MICROSCOPIC - Abnormal; Notable for the following components:      Result Value   Leukocytes,Ua MODERATE (*)  Bacteria, UA RARE (*)    All other components within normal limits  URINE CULTURE  BASIC METABOLIC PANEL WITH GFR  CBC WITH DIFFERENTIAL/PLATELET    EKG: EKG Interpretation Date/Time:  Thursday July 08 2024 13:46:02 EST Ventricular Rate:  64 PR Interval:  220 QRS Duration:  72 QT Interval:  462 QTC Calculation: 476 R Axis:   65  Text Interpretation: Sinus rhythm with 1st degree A-V block with occasional Premature ventricular complexes Possible Left atrial enlargement Low voltage QRS Borderline ECG When compared with ECG of 03-Sep-2023 11:25, PREVIOUS ECG IS PRESENT Confirmed by  Simon Rea [phone]) on 07/08/2024 10:12:41 PM  Radiology: CT Head Wo Contrast Result Date: 07/08/2024 EXAM: CT HEAD WITHOUT CONTRAST 07/08/2024 02:03:00 PM TECHNIQUE: CT of the head was performed without the administration of intravenous contrast. Automated exposure control, iterative reconstruction, and/or weight based adjustment of the mA/kV was utilized to reduce the radiation dose to as low as reasonably achievable. COMPARISON: 05/14/2021. CLINICAL HISTORY: Headache, new onset (Age >= 51y). FINDINGS: BRAIN AND VENTRICLES: No acute hemorrhage. No evidence of acute infarct. No hydrocephalus. No extra-axial collection. No mass effect or midline shift. Bilateral choroid plexus xanthogranulomas. ORBITS: No acute abnormality. SINUSES: No acute abnormality. SOFT TISSUES AND SKULL: No acute soft tissue abnormality. No skull fracture. IMPRESSION: 1. No acute intracranial abnormality. 2. Bilateral choroid plexus xanthogranulomas, incidental benign finding. Electronically signed by: Franky Stanford MD 07/08/2024 02:29 PM EST RP Workstation: [id number]     Procedures   Medications Ordered in the ED  amLODipine (NORVASC) tablet 2.5 mg (2.5 mg Oral Given 07/08/24 2252)  acetaminophen  (TYLENOL ) tablet 1,000 mg (1,000 mg Oral Given 07/08/24 2252)                                    Medical Decision Making Risk OTC drugs. Prescription drug management.     HPI:    Patient presents with a headache.  Chronic headaches.  Has been told show he has occipital neuralgia.  Has been follow-up with neurology in the past but needs new neurologist.  Took blood pressure this morning, elevated.  Patient states that she has no history of elevated blood pressure in the past.  Takes atenolol  in setting of possible arrhythmia in the past.  Patient denies any blurry vision.  Numbness or tingling.  No dysarthria or aphasia.  Otherwise feels completely fine.  No chest pain or shortness of breath.  Has appointment PCP  tomorrow morning.   Previous medical history reviewed : Patient last follow-up with PCP in August 2025.  Currently taking atenolol  25 mg once daily.   MDM:   ANO x 3 GCS 15.  NIH 0.  Cranials 2 through 12 intact.  No focal deficit.  No dysarthria or aphasia.  Strength and sensation intact bilateral upper and lower extremities.  Obtain CT head given headache and high blood pressure.  Will rule out any evidence of subarachnoid.  CT head benign.  No evidence subdural subarachnoid or epidural.  Within window for high-sensitivity CT scan in setting of ruling out subarachnoid.  Low concern.  No indication for LP at this point time.  No AKI.  No elevated creatinine to explain patient's hypertension this point time.  I did give her a very small dose of amlodipine 2.5 mg.  Will observe to see how she does.  Reevaluation:   Upon reexamination, patient hemodynamically stable.  Remains A&O x 3 with GCS  15.  Systolic dropped down to 170-180.  Feeling much better.  Gave her Tylenol  for the headache.  Do not necessarily think the hypertension is driving patient's headache.  No concerns for press.  Patient has follow-up appointment with PCP tomorrow which is take as appropriate  In terms of the findings of the cord plexus.  Do not think this is driving patient's symptoms.  She can follow-up with neurology for this outpatient.  Interventions: amlodipine   EKG Interpreted by Me: nsr   Cardiac Tele Interpreted by Me: nsr   I have independently interpreted the  CT  images and agree with the radiologist finding   Social Determinant of Health: none    Disposition and Follow Up: pcp       Final diagnoses:  Bad headache  Primary hypertension    ED Discharge Orders     None          Simon Lavonia SAILOR, MD 07/08/24 2349

## 2024-07-08 NOTE — Progress Notes (Signed)
 Subjective:    Patient ID: Emily [name], female    DOB: 11-Sep-1940, 83 y.o.   MRN: [id number]      HPI Dazaria is here for  Chief Complaint  Patient presents with   Hospitalization Follow-up    Yesterday went to Ed.   Post HA - cerivocgenic - saw two doc - can not do - gabapentin  not wokring. Ice, heat, sit up more.    Last week - had eisode - kind of bacleck ou t for 2 sec.  - pressure in head - last thrus  Last sat - had bad HA - BP 140/87 Sun - sciatica - RLE - saw ortho - meloxicam  rx on 11/18 - took wed am Took thur am - yesterday -- started getting HA - 179/? - > 200/125, 181/117 ? -- cale - hosp  - w/u neg - given amlodipine  2.5    Sciatica -    Discussed the use of AI scribe software for clinical note transcription with the patient, who gave verbal consent to proceed.  History of Present Illness Emily [name] is an 83 year old female with hypertension who presents with severe headaches and elevated blood pressure.  She is here today with her husband who helps supplement the history.  She experiences severe headaches primarily located in the posterior region of her head. Initially, gabapentin  was effective in managing the headaches, but its efficacy has diminished over time. She uses ice, heat, and elevating her bed to manage the headaches, which sometimes escalate to migraines. Recently, she experienced an episode of blacking out for a couple of seconds, accompanied by a sensation of pressure in her head.  She has seen neurology, orthopedics and neurosurgery for this.  Her sleep is not good because of the headaches.  She does not have any pain during the day.  Her blood pressure has significantly increased, with readings reaching as high as 200/125 mmHg. Previously, her blood pressure was 140/87 mmHg before starting meloxicam , which was prescribed for sciatica pain. After taking meloxicam , her blood pressure increased significantly, prompting a  hospital visit where a CT scan, blood work, and an EKG were performed, all of which were normal. She was given amlodipine , which reduced her blood pressure to 139 mmHg. She has a history of hypertension, with previous readings around 110/82 mmHg in September. She is currently taking atenolol  and has been monitoring her blood pressure, noting fluctuations.  She describes a recent onset of severe sciatica pain in her right leg and hip. The pain is described as very bad, affecting her ability to walk or stand for long periods.  She saw the PA at her orthopedics office.  She was prescribed meloxicam  15 mg daily, which she believes contributed to her elevated blood pressure.  No numbness or tingling in the leg, except for a little numbness before the knee. She sometimes needs to brace herself while walking because the leg feels weak.  She mentions difficulty sleeping and feeling nauseated and lightheaded, particularly after her recent hospital visit. She has been using gabapentin , currently at a dose of 300 mg, which she takes at night. It no longer causes drowsiness. She expresses concern about the impact of pain on her blood pressure and is cautious about medication side effects.     Medications and allergies reviewed with patient and updated if appropriate.  Current Outpatient Medications on File Prior to Visit  Medication Sig Dispense Refill   albuterol  (VENTOLIN  HFA) 108 (90 Base)  MCG/ACT inhaler Inhale 2 puffs into the lungs every 6 (six) hours as needed for wheezing or shortness of breath. 8 g 0   atenolol  (TENORMIN ) 25 MG tablet TAKE 1 TABLET BY MOUTH EVERY DAY 90 tablet 2   Calcium Carb-Cholecalciferol (CALCIUM 600 + D PO) Take by mouth.     chlorpheniramine  (CHLOR-TRIMETON ) 4 MG tablet Take 1 tablet (4 mg total) by mouth 2 (two) times daily as needed for allergies.     cholecalciferol (VITAMIN D ) 25 MCG (1000 UNIT) tablet Take 1,000 Units by mouth daily.     EPINEPHrine  0.3 mg/0.3 mL IJ SOAJ  injection Inject 0.3 mg into the muscle as needed for anaphylaxis. 1 each 0   gabapentin  (NEURONTIN ) 100 MG capsule TAKE 1 CAPSULE (100 MG TOTAL) BY MOUTH AT BEDTIME. TAKE 1 TO 3 CAPSULES AT BEDTIME 30 capsule 3   ipratropium (ATROVENT ) 0.06 % nasal spray Place 1-2 sprays into both nostrils 3 (three) times daily as needed (runny nose/drainage). 15 mL 3   levothyroxine  (SYNTHROID ) 75 MCG tablet TAKE 1 TABLET BY MOUTH EVERY DAY 6 DAYS A WEEK AND 1.5 TABLET PO ONE DAY A WEEK 96 tablet 3   meloxicam  (MOBIC ) 15 MG tablet Take 1 tablet (15 mg total) by mouth daily for 14 days. 14 tablet 0   mometasone  (NASONEX ) 50 MCG/ACT nasal spray Place 2 sprays into the nose daily. 1 each 12   Current Facility-Administered Medications on File Prior to Visit  Medication Dose Route Frequency Provider Last Rate Last Admin   denosumab  (PROLIA ) injection 60 mg  60 mg Subcutaneous Once Geofm Glade PARAS, MD       denosumab  (PROLIA ) injection 60 mg  60 mg Subcutaneous Q6 months Santasia Rew J, MD   60 mg at 02/02/24 1542   [START ON 08/03/2024] denosumab  (PROLIA ) injection 60 mg  60 mg Subcutaneous Q6 months Jiovanni Heeter, Glade PARAS, MD        Review of Systems  Constitutional:  Negative for fever.  Respiratory:  Negative for shortness of breath.   Cardiovascular:  Negative for chest pain, palpitations and leg swelling.  Gastrointestinal:  Positive for nausea.  Musculoskeletal:  Positive for neck pain.       Right-sided sciatica  Neurological:  Positive for light-headedness (Today) and headaches.  Psychiatric/Behavioral:  Positive for sleep disturbance.        Objective:   Vitals:   07/09/24 0829 07/09/24 0919  BP: (!) 148/80 (!) 142/70  Pulse: 70   Temp: 98 F (36.7 C)   SpO2: 98%    BP Readings from Last 3 Encounters:  07/09/24 (!) 142/70  07/08/24 (!) 152/88  04/30/24 110/82   Wt Readings from Last 3 Encounters:  07/09/24 123 lb (55.8 kg)  07/08/24 120 lb (54.4 kg)  04/30/24 120 lb 12.8 oz (54.8 kg)   Body  mass index is 24.02 kg/m.    Physical Exam Constitutional:      General: She is not in acute distress.    Appearance: Normal appearance.  HENT:     Head: Normocephalic and atraumatic.  Eyes:     Conjunctiva/sclera: Conjunctivae normal.  Cardiovascular:     Rate and Rhythm: Normal rate and regular rhythm.     Heart sounds: Normal heart sounds.  Pulmonary:     Effort: Pulmonary effort is normal. No respiratory distress.     Breath sounds: Normal breath sounds. No wheezing.  Musculoskeletal:     Cervical back: Neck supple.     Right lower leg:  No edema.     Left lower leg: No edema.  Lymphadenopathy:     Cervical: No cervical adenopathy.  Skin:    General: Skin is warm and dry.     Findings: No rash.  Neurological:     Mental Status: She is alert. Mental status is at baseline.  Psychiatric:        Mood and Affect: Mood normal.        Behavior: Behavior normal.            Assessment & Plan:    See Problem List for Assessment and Plan of chronic medical problems.

## 2024-07-08 NOTE — ED Notes (Signed)
 Called pt 3x for room, and received no response.

## 2024-07-08 NOTE — Patient Instructions (Addendum)
      Medications changes include :   start amlodipine  2.5 mg daily  Monitor your BP at home  A referral was ordered for Dr Oneita and someone will call you to schedule an appointment.     Return in about 4 weeks (around 08/06/2024) for follow up.

## 2024-07-09 ENCOUNTER — Ambulatory Visit: Admitting: Internal Medicine

## 2024-07-09 ENCOUNTER — Encounter: Payer: Self-pay | Admitting: Internal Medicine

## 2024-07-09 VITALS — BP 142/70 | HR 70 | Temp 98.0°F | Ht 60.0 in | Wt 123.0 lb

## 2024-07-09 DIAGNOSIS — M5431 Sciatica, right side: Secondary | ICD-10-CM

## 2024-07-09 DIAGNOSIS — I10 Essential (primary) hypertension: Secondary | ICD-10-CM | POA: Diagnosis not present

## 2024-07-09 DIAGNOSIS — G4486 Cervicogenic headache: Secondary | ICD-10-CM | POA: Diagnosis not present

## 2024-07-09 HISTORY — DX: Essential (primary) hypertension: I10

## 2024-07-09 MED ORDER — AMLODIPINE BESYLATE 2.5 MG PO TABS: 2.5000 mg | ORAL_TABLET | Freq: Every day | ORAL | 1 refills | Status: AC

## 2024-07-09 NOTE — Assessment & Plan Note (Signed)
 Chronic Has seen neurology and neurosurgery She is currently taking gabapentin  300 mg at bedtime which worked for a while, but is no longer working Was not eligible for nerve blocks/ablation or injections Advised increasing gabapentin  to 400 mg daily-can go to 500 or 600 milligrams if needed Has not tolerated methocarbamol  in the past, but could also consider trying a different low-dose muscle relaxant Continue ice, heat, topical medications Can consider trying TENS unit which she has Referral to Dr. Oneita at the headache clinic

## 2024-07-09 NOTE — Assessment & Plan Note (Signed)
 Acute Experiencing sciatica-like pain in the right leg Did see the PA at the orthopedic office and was prescribed meloxicam , unfortunately this did cause elevation in her blood pressure and she has stopped it Hopefully increasing the gabapentin  will help Has been referred to PT which will hopefully help Will follow-up with orthopedics

## 2024-07-09 NOTE — Telephone Encounter (Signed)
 Spoke with patient this afternoon over the phone concerning her symptoms.  She was seen in the ED yesterday with significantly increased blood pressure shortly after beginning meloxicam .  Was appropriately recommended to discontinue this.  She still is having some sciatic symptoms, so I discussed that she can follow-up with me at any time and we may need to consider MRI to determine if she would benefit from [facility].  She is not set to begin PT until January.  Patient seemed agreeable to plan.

## 2024-07-09 NOTE — Assessment & Plan Note (Signed)
 New Currently on atenolol  25 mg daily for palpitations Has had several episodes of elevated blood pressure-recently very elevated which was likely medication induced-was on meloxicam  Since blood pressure has been more elevated than even prior to meloxicam  I think we need to adjust medication/start medication for blood pressure Start amlodipine  2.5 mg daily Continue atenolol  25 mg daily-originally for palpitations Monitor BP closely

## 2024-07-10 LAB — URINE CULTURE: Culture: <10000 — AB

## 2024-07-12 ENCOUNTER — Telehealth: Payer: Self-pay | Admitting: Internal Medicine

## 2024-07-12 NOTE — Telephone Encounter (Signed)
 Copied from CRM #[id number]. Topic: Referral - Status >> Jul 12, 2024 10:14 AM Amy B wrote: Reason for CRM: [facility].  They will not be able to see the patient as insurance is out of network with this provider.  They request you refer the patient elsewhere.

## 2024-07-12 NOTE — Telephone Encounter (Signed)
 Does she want to see a neurologist at [facility] neurology-Dr. Ines is no longer there?

## 2024-07-13 ENCOUNTER — Encounter: Payer: Self-pay | Admitting: Internal Medicine

## 2024-07-16 ENCOUNTER — Other Ambulatory Visit (HOSPITAL_COMMUNITY): Payer: Self-pay

## 2024-07-16 ENCOUNTER — Other Ambulatory Visit: Payer: Self-pay

## 2024-07-16 NOTE — Progress Notes (Unsigned)
 Benefits Investigation Started  Reason: Product Not on Formulary  Routed to: [facility]

## 2024-07-19 ENCOUNTER — Other Ambulatory Visit: Payer: Self-pay

## 2024-07-19 ENCOUNTER — Telehealth: Payer: Self-pay

## 2024-07-19 NOTE — Telephone Encounter (Signed)
 Patient's insurance plan does not cover Prolia . Preferred product is Jubbonti. Please send a new prescription to [facility]. Thanks!

## 2024-07-19 NOTE — Telephone Encounter (Signed)
 Patient choses to stay with Prolia  and we will dispense from here.   Option# 1: Buy/Bill (Office supplied medication)   Out-of-pocket cost due at time of clinic visit: $357   Number of injection/visits approved: ---   Primary: [employer] Prolia  co-insurance: 20% Admin fee co-insurance: 20%

## 2024-07-22 NOTE — Progress Notes (Signed)
 Office will be doing buy and bill for Prolia . Dis-enrolling.

## 2024-07-23 ENCOUNTER — Other Ambulatory Visit: Payer: Self-pay

## 2024-07-23 ENCOUNTER — Ambulatory Visit: Admitting: Family Medicine

## 2024-07-23 ENCOUNTER — Ambulatory Visit

## 2024-07-23 VITALS — BP 110/60 | HR 102 | Ht 60.0 in | Wt 122.0 lb

## 2024-07-23 DIAGNOSIS — M545 Low back pain, unspecified: Secondary | ICD-10-CM

## 2024-07-23 DIAGNOSIS — M7918 Myalgia, other site: Secondary | ICD-10-CM | POA: Diagnosis not present

## 2024-07-23 DIAGNOSIS — G8929 Other chronic pain: Secondary | ICD-10-CM | POA: Diagnosis not present

## 2024-07-23 DIAGNOSIS — M5441 Lumbago with sciatica, right side: Secondary | ICD-10-CM

## 2024-07-23 MED ORDER — PROPRANOLOL HCL 10 MG PO TABS: 10.0000 mg | ORAL_TABLET | Freq: Three times a day (TID) | ORAL | 1 refills | Status: AC | PRN

## 2024-07-23 MED ORDER — PREDNISONE 5 MG (48) PO TBPK: ORAL_TABLET | ORAL | 0 refills | Status: AC

## 2024-07-23 NOTE — Patient Instructions (Addendum)
 Thank you for coming in today.  X-ray on the way out. Let me know how you are doing via mychart. Will re-access after x-ray results are back.

## 2024-07-23 NOTE — Progress Notes (Signed)
   I, Claretha Schimke am a scribe for Dr. Artist Lloyd, MD.  Emily [name] is a 83 y.o. female who presents to [facility] at [facility] today for low back/buttock pain. Pt was last seen by Dr. 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 No PT visits were scheduled.  Today, pt c/o low back/buttock pain x for about three weeks now. Pt locates pain to right sided low back pain. Some times the leg gives out at random. Pain does not wake her up at night anymore but it did at first. No injury to cause the pain.   Radiating pain:yes down the leg LE numbness/tingling: yes but not a lot LE weakness: yes Aggravates: Walking, standing, sitting, sleeping Treatments tried: ice, heat, gabapentin   Dx imaging: 07/06/24 R hip XR 04/14/23 Pelvis MRI 03/19/23 L hip XR 04/02/22 L-spine XR  Pertinent review of systems: No fevers or chills  Relevant historical information: Hypertension Left hip gluteus transfer  Exam:  BP 110/60   Pulse (!) 102   Ht 5' (1.524 m)   Wt 122 lb (55.3 kg)   SpO2 98%   BMI 23.83 kg/m  General: Well Developed, well nourished, and in no acute distress.   MSK: L-spine: Normal appearing Nontender palpation spinal midline.  Decreased lumbar motion.  Lower extremity strength generally intact except noted below.  Mildly positive right-sided straight leg raise test.  Right hip: Normal-appearing Not particularly tender to palpation.  Normal motion. Hip abduction strength is mildly diminished but not painful.  Lab and Radiology Results  X-ray images lumbar spine obtained today personally and independently interpreted. No acute fractures.  Multilevel degenerative changes are present. Await formal radiology review    Assessment and Plan: 83 y.o. female with chronic  right leg pain.  This is concerning for lumbar radiculopathy involving L5 or S1 nerve roots.  Pain is located around the hip but her hip is not particularly tender to palpation nor does resistance strength testing of the hip abductor and rotator tendons produce pain.  Plan for course of prednisone .  She notes significant flushing with steroids.  We talked about options and we will try low-dose propranolol .  PDMP not reviewed this encounter. Orders Placed This Encounter  Procedures   DG Lumbar Spine 2-3 Views    Standing Status:   Future    Number of Occurrences:   1    Expiration Date:   07/23/2025    Reason for Exam (SYMPTOM  OR DIAGNOSIS REQUIRED):   back pain    Preferred imaging location?:   [place]   Meds ordered this encounter  Medications   predniSONE  (STERAPRED UNI-PAK 48 TAB) 5 MG (48) TBPK tablet    Sig: 12 day dosepack po    Dispense:  48 tablet    Refill:  0   propranolol  (INDERAL ) 10 MG tablet    Sig: Take 1 tablet (10 mg total) by mouth 3 (three) times daily as needed (flushing).    Dispense:  90 tablet    Refill:  1     Discussed warning signs or symptoms. Please see discharge instructions. Patient expresses understanding.   The above documentation has been reviewed and is accurate and complete Artist Lloyd, M.D.

## 2024-07-26 ENCOUNTER — Other Ambulatory Visit (HOSPITAL_COMMUNITY): Payer: Self-pay

## 2024-07-26 NOTE — Telephone Encounter (Signed)
 Patient also has [employer]. Resubmitted in [employer]

## 2024-07-27 ENCOUNTER — Encounter (HOSPITAL_BASED_OUTPATIENT_CLINIC_OR_DEPARTMENT_OTHER): Payer: Self-pay | Admitting: Physical Therapy

## 2024-07-27 ENCOUNTER — Ambulatory Visit (HOSPITAL_BASED_OUTPATIENT_CLINIC_OR_DEPARTMENT_OTHER): Admitting: Physical Therapy

## 2024-07-27 ENCOUNTER — Other Ambulatory Visit: Payer: Self-pay

## 2024-07-27 DIAGNOSIS — M6281 Muscle weakness (generalized): Secondary | ICD-10-CM | POA: Diagnosis present

## 2024-07-27 DIAGNOSIS — G8929 Other chronic pain: Secondary | ICD-10-CM | POA: Diagnosis present

## 2024-07-27 DIAGNOSIS — M5441 Lumbago with sciatica, right side: Secondary | ICD-10-CM | POA: Diagnosis present

## 2024-07-27 NOTE — Telephone Encounter (Signed)
 SABRA

## 2024-07-27 NOTE — Therapy (Signed)
 OUTPATIENT PHYSICAL THERAPY THORACOLUMBAR EVALUATION   Patient Name: Emily [name] MRN: [id number] DOB:02-11-1941, 83 y.o., female Today's Date: 07/27/2024  END OF SESSION:  PT End of Session - 07/27/24 1456     Visit Number 1    PT Start Time 1455          Past Medical History:  Diagnosis Date   Actinic keratosis    Allergic rhinitis    BACK PAIN    CARCINOMA, BREAST, 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   L s/p lumpectomy, chemo and XRT   Cervicalgia    Cholelithiasis    DIVERTICULOSIS, COLON    ENDOMETRIOSIS    Heart palpitations    Hypertension 07/09/2024   HYPOTHYROIDISM    Left wrist fracture 11/06/2016   10/2016 - fell of high stool   Osteoporosis    Palpitations    Personal history of chemotherapy    Personal history of radiation therapy    Pleural plaque without asbestos    CT chest 10/2010: R>L lobes, upper> lower   Renal disorder    Squamous cell carcinoma    History of BC   Past Surgical History:  Procedure Laterality Date   BREAST LUMPECTOMY  2001   left   CATARACT EXTRACTION Bilateral 09/2014   both eyes   CHOLECYSTECTOMY  2010   ENDOMETRIAL ABLATION     EXCISION / BIOPSY BREAST / NIPPLE / DUCT Left 2001   GLUTEUS MINIMUS REPAIR Left 09/09/2023   Procedure: LEFT GLUTEUS MAXIMUS TENDON TRANSFER REPAIR;  Surgeon: Genelle Standing, MD;  Location: [facility];  Service: Orthopedics;  Laterality: Left;   Patient Active Problem List   Diagnosis Date Noted   Hypertension 07/09/2024   URI (upper respiratory infection) 04/12/2024   Wheezing 04/12/2024   RLS (restless legs syndrome) 10/31/2023   Vitamin D  deficiency 10/31/2023   Tendinopathy of left gluteus medius 09/09/2023   Acute cystitis 04/16/2023   Change in bowel habits 01/15/2023   Anxiety 01/01/2023   Agatston coronary artery calcium score 22.4  (12/2021) 10/30/2022   Urge incontinence of urine 10/30/2022   Word finding difficulty 10/30/2022   Chronic pain of left knee  08/21/2022   Sciatica of right side 03/25/2022   Cough 12/27/2021   Sleep difficulties 09/27/2021   Cervicogenic headache 04/10/2021   Skin abnormality 02/27/2021   Aortic atherosclerosis 09/22/2020   Urinary urgency 03/23/2020   CKD (chronic kidney disease) stage 3, GFR 30-59 ml/min (HCC) 03/22/2020   Prediabetes 03/22/2020   Change in stool 11/17/2019   Right-sided chest wall pain 11/17/2019   Urinary retention 09/23/2019   SOB (shortness of breath) 01/15/2019   Upper airway cough syndrome 01/15/2019   Reactive airway disease 12/05/2018   Tear of left rotator cuff 10/05/2018   Pre-syncope 05/19/2018   DDD (degenerative disc disease), cervical 01/15/2018   Neuralgia 09/12/2017   Presbycusis of both ears 12/11/2016   Memory difficulties 11/06/2016   IBS (irritable bowel syndrome) 04/16/2016   Hyperlipidemia 09/10/2015   Osteoporosis 09/08/2015   Palpitations    Pleural plaque without asbestos 12/03/2010   Chronic neck pain 12/18/2009   Diverticulosis of colon 03/02/2009   Lumbago 03/11/2008   Actinic keratosis 01/23/2008   History of left breast cancer 10/12/2007   Hypothyroidism 06/18/2007   Allergic rhinitis 06/11/2007    PCP: ***  REFERRING PROVIDER: Emiliano Leonce CROME, PA-C  REFERRING DIAG: M54.41,G89.29 (ICD-10-CM) - Chronic low back pain with right-sided sciatica, unspecified back pain laterality  Rationale for Evaluation and Treatment: Rehabilitation  THERAPY DIAG:  No diagnosis found.  ONSET DATE:  Approx 06/29/24  SUBJECTIVE:                                                                                                                                                                                           SUBJECTIVE STATEMENT: Pt began having pain about a week prior to seeing PA on 11/18.  Pt denies any specific MOI.  She was in extreme pain.  Pt had x rays which looked good per pt.  PA gave pt exercises and ordered PT.  PT order indicated right sided  sciatica.  She tried the exercises, but they hurt too much.  Pt saw Dr. Joane on 12/5 and he took x rays.  He prescribed prednisone .  She is on day 3 and is feeling much better.    Pt states the leg was giving out on her, but she did not fall.  Pt was using a cane.   Pt states she is not having any R LE pain now since taking prednisone .  Pt doesn't have any difficulty or limitations since taking the prednisone .  Prior to prednisone :  She was having difficulty sleeping, ambulating, bending, turning, and household chores due to pain.    PERTINENT HISTORY:  L hip gluteus maximus tendon transfers and trochanteric bursectomy on 09/09/23 OP, bilat occipital neuralgia DDD  PAIN:  NPRS:  0/10 current, 8/10 worst Location:  Lower R sided lumbar/SI region down posterior R LE to post knee.   PRECAUTIONS: {Therapy precautions:24002}  RED FLAGS: {PT Red Flags:29287}   WEIGHT BEARING RESTRICTIONS: {Yes ***/No:24003}  FALLS:  Has patient fallen in last 6 months? No  LIVING ENVIRONMENT: Lives with: lives with their spouse Lives in:  primarily one floor, has a bonus room upstairs Stairs: yes   OCCUPATION: ***  PLOF: {PLOF:24004}  PATIENT GOALS:  less pain, more comfortable sleeping (also affected by occipital neuralgia)  OBJECTIVE:  Note: Objective measures were completed at Evaluation unless otherwise noted.  DIAGNOSTIC FINDINGS:  Xray (AP pelvis, right hip 3 views): Negative for acute fracture or dislocation.  Hip joint spaces are well-maintained with minimal degenerative changes.  Per Dr. Virgilio note: X-ray images lumbar spine obtained today personally and independently interpreted. No acute fractures.  Multilevel degenerative changes are present.  PATIENT SURVEYS:  {rehab surveys:24030}  COGNITION: Overall cognitive status: Within functional limits for tasks assessed     SENSATION: {sensation:27233}  MUSCLE LENGTH: Hamstrings: Right *** deg; Left *** deg Thomas test:  Right *** deg; Left *** deg  POSTURE: {posture:25561}  PALPATION: TTP:  superior L glute.  No tenderness in bilat lumbar paraspinals  or central lumbar.   LUMBAR ROM:   AROM eval  Flexion   Extension   Right lateral flexion   Left lateral flexion   Right rotation   Left rotation    (Blank rows = not tested)  LOWER EXTREMITY ROM:     {AROM/PROM:27142}  Right eval Left eval  Hip flexion 5/5 4+/5  Hip extension    Hip abduction 19.2 18.8  Hip adduction    Hip internal rotation    Hip external rotation    Knee flexion 5/5 seated 5/5 seated  Knee extension 4/5 4/5  Ankle dorsiflexion    Ankle plantarflexion    Ankle inversion    Ankle eversion     (Blank rows = not tested)  LOWER EXTREMITY MMT:    MMT Right eval Left eval  Hip flexion    Hip extension    Hip abduction    Hip adduction    Hip internal rotation    Hip external rotation    Knee flexion    Knee extension    Ankle dorsiflexion    Ankle plantarflexion    Ankle inversion    Ankle eversion     (Blank rows = not tested)  LUMBAR SPECIAL TESTS:  SLR Test:  negative bilat  FUNCTIONAL TESTS:  {Functional tests:24029}  GAIT:  Assistive device utilized: None Level of assistance: Complete Independence Comments: WFL.  Pt had no limp with gait.   TREATMENT DATE: ***                                                                                                                                 PATIENT EDUCATION:  Education details: *** Person educated: {Person educated:25204} Education method: {Education Method:25205} Education comprehension: {Education Comprehension:25206}  HOME EXERCISE PROGRAM: ***  ASSESSMENT:  CLINICAL IMPRESSION: Patient is a *** y.o. *** who was seen today for physical therapy evaluation and treatment for ***.   OBJECTIVE IMPAIRMENTS: {opptimpairments:25111}.   ACTIVITY LIMITATIONS: {activitylimitations:27494}  PARTICIPATION LIMITATIONS:  {participationrestrictions:25113}  PERSONAL FACTORS: {Personal factors:25162} are also affecting patient's functional outcome.   REHAB POTENTIAL: {rehabpotential:25112}  CLINICAL DECISION MAKING: {clinical decision making:25114}  EVALUATION COMPLEXITY: {Evaluation complexity:25115}   GOALS: Goals reviewed with patient? {yes/no:20286}  SHORT TERM GOALS: Target date: ***  *** Baseline: Goal status: INITIAL  2.  *** Baseline:  Goal status: INITIAL  3.  *** Baseline:  Goal status: INITIAL  4.  *** Baseline:  Goal status: INITIAL  5.  *** Baseline:  Goal status: INITIAL  6.  *** Baseline:  Goal status: INITIAL  LONG TERM GOALS: Target date: ***  *** Baseline:  Goal status: INITIAL  2.  *** Baseline:  Goal status: INITIAL  3.  *** Baseline:  Goal status: INITIAL  4.  *** Baseline:  Goal status: INITIAL  5.  *** Baseline:  Goal status: INITIAL  6.  *** Baseline:  Goal status: INITIAL  PLAN:  PT FREQUENCY: 1x/week  PT  DURATION: {rehab duration:25117}  PLANNED INTERVENTIONS: {rehab planned interventions:25118::97110-Therapeutic exercises,97530- Therapeutic [phone]- Neuromuscular re-education,97535- Self Rjmz,02859- Manual therapy,Patient/Family education}.  PLAN FOR NEXT SESSION: ***   Mose Minerva, PT 07/27/2024, 2:57 PM

## 2024-07-27 NOTE — Telephone Encounter (Signed)
 Pt ready for scheduling for PROLIA  on or after : 08/03/24  Option# 1: Buy/Bill (Office supplied medication)  Out-of-pocket cost due at time of clinic visit: $0  Number of injection/visits approved: ---  Primary: [employer] Prolia  co-insurance: 0% Admin fee co-insurance: 0%  Secondary: [employer] Prolia  co-insurance:  Admin fee co-insurance:   Medical Benefit Details: Date Benefits were checked: 07/27/24 Deductible: NO/ Coinsurance: 0%/ Admin Fee: 0%  Prior Auth: N/A PA# Expiration Date:   # of doses approved: ----------------------------------------------------------------------- Option# 2- Med Obtained from pharmacy: Prolia  is no longer preferred for pharmacy benefit. Jubbonti is now preferred. PRICING IS FOR JUBBONTI   Pharmacy benefit: Copay $250 (Paid to pharmacy) Admin Fee: 20% (Pay at clinic)   Prior Auth: N/A PA# Expiration Date:   # of doses approved:     If patient wants fill through the pharmacy benefit please send prescription to: [facility], and include estimated need by date in rx notes. Pharmacy will ship medication directly to the office.   Patient NOT eligible for Prolia  Copay Card. Copay Card can make patient's cost as little as $25. Link to apply: [email or web address]   ** This summary of benefits is an estimation of the patient's out-of-pocket cost. Exact cost may very based on individual plan coverage.

## 2024-07-29 ENCOUNTER — Telehealth: Payer: Self-pay | Admitting: Family Medicine

## 2024-07-29 NOTE — Telephone Encounter (Signed)
 Patient called to let Dr Joane know that she has been feeling good while on the Prednisone  and the medication she was given for flush has helped.  She asked if someone would be able to call her in regards to her xay results?  Please advise.

## 2024-07-30 ENCOUNTER — Ambulatory Visit: Payer: Self-pay | Admitting: Family Medicine

## 2024-07-30 NOTE — Progress Notes (Signed)
 Low back x-ray shows arthritis in the spine.

## 2024-07-30 NOTE — Telephone Encounter (Signed)
 Artist GORMAN Lloyd, MD 07/30/2024  7:16 AM EST     Low back x-ray shows arthritis in the spine.

## 2024-07-30 NOTE — Telephone Encounter (Signed)
 Called pt and advised of results per Dr. Alease Hunter. Pt verbalized understanding.

## 2024-08-02 ENCOUNTER — Encounter (HOSPITAL_BASED_OUTPATIENT_CLINIC_OR_DEPARTMENT_OTHER): Payer: Self-pay | Admitting: Physical Therapy

## 2024-08-02 ENCOUNTER — Ambulatory Visit (HOSPITAL_BASED_OUTPATIENT_CLINIC_OR_DEPARTMENT_OTHER): Admitting: Physical Therapy

## 2024-08-02 DIAGNOSIS — M6281 Muscle weakness (generalized): Secondary | ICD-10-CM

## 2024-08-02 DIAGNOSIS — G8929 Other chronic pain: Secondary | ICD-10-CM

## 2024-08-02 DIAGNOSIS — M5441 Lumbago with sciatica, right side: Secondary | ICD-10-CM | POA: Diagnosis not present

## 2024-08-02 NOTE — Therapy (Signed)
 OUTPATIENT PHYSICAL THERAPY THORACOLUMBAR TREATMENT   Patient Name: Emily [name] MRN: [id number] DOB:1941/01/09, 83 y.o., female Today's Date: 08/02/2024  END OF SESSION:  PT End of Session - 08/02/24 9187     Visit Number 2    Number of Visits 6    Date for Recertification  08/31/24    Authorization Type HTA    PT Start Time 0803    PT Stop Time 0849    PT Time Calculation (min) 46 min    Activity Tolerance Patient tolerated treatment well    Behavior During Therapy [facility] for tasks assessed/performed           Past Medical History:  Diagnosis Date   Actinic keratosis    Allergic rhinitis    BACK PAIN    CARCINOMA, BREAST, 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   L s/p lumpectomy, chemo and XRT   Cervicalgia    Cholelithiasis    DIVERTICULOSIS, COLON    ENDOMETRIOSIS    Heart palpitations    Hypertension 07/09/2024   HYPOTHYROIDISM    Left wrist fracture 11/06/2016   10/2016 - fell of high stool   Osteoporosis    Palpitations    Personal history of chemotherapy    Personal history of radiation therapy    Pleural plaque without asbestos    CT chest 10/2010: R>L lobes, upper> lower   Renal disorder    Squamous cell carcinoma    History of BC   Past Surgical History:  Procedure Laterality Date   BREAST LUMPECTOMY  2001   left   CATARACT EXTRACTION Bilateral 09/2014   both eyes   CHOLECYSTECTOMY  2010   ENDOMETRIAL ABLATION     EXCISION / BIOPSY BREAST / NIPPLE / DUCT Left 2001   GLUTEUS MINIMUS REPAIR Left 09/09/2023   Procedure: LEFT GLUTEUS MAXIMUS TENDON TRANSFER REPAIR;  Surgeon: Genelle Standing, MD;  Location: [facility];  Service: Orthopedics;  Laterality: Left;   Patient Active Problem List   Diagnosis Date Noted   Hypertension 07/09/2024   URI (upper respiratory infection) 04/12/2024   Wheezing 04/12/2024   RLS (restless legs syndrome) 10/31/2023   Vitamin D  deficiency 10/31/2023   Tendinopathy of left gluteus medius 09/09/2023    Acute cystitis 04/16/2023   Change in bowel habits 01/15/2023   Anxiety 01/01/2023   Agatston coronary artery calcium score 22.4  (12/2021) 10/30/2022   Urge incontinence of urine 10/30/2022   Word finding difficulty 10/30/2022   Chronic pain of left knee 08/21/2022   Sciatica of right side 03/25/2022   Cough 12/27/2021   Sleep difficulties 09/27/2021   Cervicogenic headache 04/10/2021   Skin abnormality 02/27/2021   Aortic atherosclerosis 09/22/2020   Urinary urgency 03/23/2020   CKD (chronic kidney disease) stage 3, GFR 30-59 ml/min (HCC) 03/22/2020   Prediabetes 03/22/2020   Change in stool 11/17/2019   Right-sided chest wall pain 11/17/2019   Urinary retention 09/23/2019   SOB (shortness of breath) 01/15/2019   Upper airway cough syndrome 01/15/2019   Reactive airway disease 12/05/2018   Tear of left rotator cuff 10/05/2018   Pre-syncope 05/19/2018   DDD (degenerative disc disease), cervical 01/15/2018   Neuralgia 09/12/2017   Presbycusis of both ears 12/11/2016   Memory difficulties 11/06/2016   IBS (irritable bowel syndrome) 04/16/2016   Hyperlipidemia 09/10/2015   Osteoporosis 09/08/2015   Palpitations    Pleural plaque without asbestos 12/03/2010   Chronic neck pain 12/18/2009   Diverticulosis of colon 03/02/2009   Lumbago 03/11/2008  Actinic keratosis 01/23/2008   History of left breast cancer 10/12/2007   Hypothyroidism 06/18/2007   Allergic rhinitis 06/11/2007     REFERRING PROVIDER: Emiliano Leonce CROME, PA-C  REFERRING DIAG: [phone] (ICD-10-CM) - Chronic low back pain with right-sided sciatica, unspecified back pain laterality  Rationale for Evaluation and Treatment: Rehabilitation  THERAPY DIAG:  Chronic right-sided low back pain with right-sided sciatica  Muscle weakness (generalized)  ONSET DATE:  Approx 06/29/24  SUBJECTIVE:                                                                                                                                                                                            SUBJECTIVE STATEMENT: Pt was busy yesterday at [place] putting on an event.  She was up and down all day at an event.  Pt reports having increased pain during and after the event. She is hurting at proximal posterior thigh, R glute, and lower lumbar.  Pt called about her x rays and she was informed her x rays showed arthritis.  Pt states she is getting toward the end of her prednisone  and taking a lower dosage.  Pt states she didn't do the exercises she received from PA because they hurt.  She can't remember them, but she thinks one was a HS stretch.   Pt denies any adverse effects after prior treatment.     PERTINENT HISTORY:  L hip gluteus maximus tendon transfers and trochanteric bursectomy on 09/09/23 Osteoporosis, bilat occipital neuralgia DDD  PAIN:  NPRS:  3-4/10 current, 8/10 worst Location:  central and Lower R sided lumbar, R proximal post thigh   PRECAUTIONS: Other: osteoporosis, L glute max tendon transfer on 09/09/23    WEIGHT BEARING RESTRICTIONS: No  FALLS:  Has patient fallen in last 6 months? No  LIVING ENVIRONMENT: Lives with: lives with their spouse Lives in:  primarily one floor, has a bonus room upstairs Stairs: yes    PLOF: Independent  PATIENT GOALS:  less pain, more comfortable sleeping (also affected by occipital neuralgia)  OBJECTIVE:  Note: Objective measures were completed at Evaluation unless otherwise noted.  DIAGNOSTIC FINDINGS:  Xray (AP pelvis, right hip 3 views): Negative for acute fracture or dislocation.  Hip joint spaces are well-maintained with minimal degenerative changes.  Per Dr. Virgilio note: X-ray images lumbar spine obtained today personally and independently interpreted. No acute fractures.  Multilevel degenerative changes are present.  Lumbar X rays: FINDINGS: 5 nonrib bearing lumbar-type vertebral bodies.   Vertebral body heights are maintained. No  acute fracture. Generalized osteopenia.   Minimal retrolisthesis of L2 on L3 and L1 on L2. No spondylolysis.  Moderate disc height loss at L5-S1. Mild disc height loss at L1-2 and L2-3. Mild bilateral facet arthropathy at L5-S1.   SI joints are unremarkable.   IMPRESSION: 1. Lumbar spine spondylosis as described above. 2. No acute osseous injury of the lumbar spine.  PATIENT SURVEYS:  Modified Oswestry Disability Index:  16= 32%  COGNITION: Overall cognitive status: Within functional limits for tasks assessed     PALPATION: TTP:  superior L glute.  No tenderness in bilat lumbar paraspinals or central lumbar.   LUMBAR ROM:   AROM eval  Flexion   Extension   Right lateral flexion   Left lateral flexion   Right rotation   Left rotation    (Blank rows = not tested)  LOWER EXTREMITY STRENGTH:     MMT  Right eval Left eval  Hip flexion 5/5 4+/5  Hip extension    Hip abduction 19.2 18.8  Hip adduction    Hip internal rotation    Hip external rotation    Knee flexion 5/5 seated 5/5 seated  Knee extension 4/5 4/5  Ankle dorsiflexion    Ankle plantarflexion    Ankle inversion    Ankle eversion     (Blank rows = not tested)   LUMBAR SPECIAL TESTS:  SLR Test:  negative bilat   GAIT:  Assistive device utilized: None Level of assistance: Complete Independence Comments: WFL.  Pt had no limp with gait.   TREATMENT:                                                                                                                                Reviewed pt presentation, response to prior treatment, and pain level.   Nustep lvl 3 bilat UE/LE x 5 mins PT educated pt in correct performance and palpation of TrA contraction.  Pt performed supine TrA contraction with and without a 3 sec hold.  Supine bridge with TrA 2x10 Supine alt LE extension with TrA 2x10 LAQ R:  2x5, L:  x10 Supine manual HS stretch bilat 2 reps with passive AP's on R LE for neural flossing.  Pt  received a HEP handout and was educated in correct form and appropriate frequency.  PT instructed pt she should not have pain with HEP.     PATIENT EDUCATION:  Education details: dx, relevant anatomy, rationale of interventions, POC, prognosis, exercise form, and HEP.  PT answered pt's questions.   Person educated: Patient Education method: Explanation, demonstration, verbal and tactile cues, handout Education comprehension: verbalized understanding, returned demonstration, verbal and tactile cues required, needs further instruction  HOME EXERCISE PROGRAM: Access Code: [facility] URL: [email or web address] Date: 08/02/2024 Prepared by: Mose Minerva  Exercises - Supine Transversus Abdominis Bracing - Hands on Stomach  - 2 x daily - 7 x weekly - 2 sets - 10 reps - 3 second hold - Supine Bridge  - 1 x daily - 6-7 x weekly - 2 sets - 10 reps - Supine  Core Control with Leg Extension  - 1 x daily - 7 x weekly - 2 sets - 10 reps  ASSESSMENT:  CLINICAL IMPRESSION: Pt's prednisone  dosage is decreasing and she had a busy day putting on an event this weekend.  She presents to treatment today in increased pain.  PT instructed pt in correct form with exercises to improve LE and core strength, pain, and neural mobility.  Pt tolerated exercises well.  She reports having soreness and pain on R sided lumbar and also some pain in R post thigh with R LAQ.  PT performed supine HS stretch with passive AP's on R side to improve neural mobility and sx's.  PT established HEP today.  Pt received a HEP handout and pt demonstrates good understanding of HEP.  Pt couldn't remember the HEP from the PA, but reports having the handout.  PT instructed pt to bring in her exercises from the PA next visit.  She responded well to treatment stating she felt better and is walking better after treatment.      OBJECTIVE IMPAIRMENTS: decreased activity tolerance, decreased mobility, decreased strength, and pain.    ACTIVITY LIMITATIONS: bending and locomotion level  PARTICIPATION LIMITATIONS: cleaning and community activity  PERSONAL FACTORS: 1-2 comorbidities: Other: osteoporosis, L glute max tendon transfer  are also affecting patient's functional outcome.   REHAB POTENTIAL: Good  CLINICAL DECISION MAKING: Stable/uncomplicated  EVALUATION COMPLEXITY: Low   GOALS:   SHORT TERM GOALS: Target date: 08/17/2024   Pt will tolerate exercises without adverse effects for improved strength, function, and mobility.  Baseline: Goal status: INITIAL  2.  Pt will continue to have no feelings of her LE giving out with her normal ambulation.  Baseline:  Goal status: INITIAL   LONG TERM GOALS: Target date: 08/31/2024  Pt's worst pain will be no > than 3/10. Baseline:  Goal status: INITIAL  2.  Pt will report continued performance of daily activities and community ambulation without significant pain and difficulty.  Baseline:  Goal status: INITIAL  3.  Pt will demo improved bilat LE strength by 5 lb's in hip abd and to 5/5 in knee extension for improved performance and tolerance of IADL's and functional mobility.  Baseline:  Goal status: INITIAL  4.  Pt will be independent with HEP for improved strength, pain, and function.  Baseline:  Goal status: INITIAL    PLAN:  PT FREQUENCY: 1x/week  PT DURATION: 5 weeks  PLANNED INTERVENTIONS: [id number]- PT Re-evaluation, 97750- Physical Performance Testing, 97110-Therapeutic exercises, 97530- Therapeutic activity, [id number]- Neuromuscular re-education, 97535- Self Care, 02859- Manual therapy, [id number]- Gait training, [phone]- Aquatic Therapy, [phone]- Electrical stimulation (unattended), [phone]- Electrical stimulation (manual), [id number]- Ultrasound, 79439 (1-2 muscles), 20561 (3+ muscles)- Dry Needling, Patient/Family education, Balance training, Stair training, Taping, DME instructions, Cryotherapy, and Moist heat.  PLAN FOR NEXT SESSION:  Core and LE  strengthening per pt tolerance.  Neural mobilization as tolerated.   Leigh Minerva III PT, DPT 08/02/2024 9:57 PM

## 2024-08-05 NOTE — Progress Notes (Unsigned)
 Subjective:    Patient ID: Danique Hartsough, female    DOB: Jan 14, 1941, 83 y.o.   MRN: [id number]     HPI Ninoska is here for follow up of her chronic medical problems.  Hypertension-here for follow-up of hypertension.  Started on amlodipine  2.5 mg daily 4 weeks ago and continued on atenolol  25 mg daily which she also takes for her palpitations.  Sciatica, right side-seen orthopedics, doing PT, meloxicam  is likely what caused increased blood pressure so it was stopped, gabapentin  increased.   Cervicogenic headaches-we increased gabapentin  to 400 mg nightly at her last visit.  Referred to neurology to help with her headaches     Medications and allergies reviewed with patient and updated if appropriate.  Medications Ordered Prior to Encounter[1]   Review of Systems     Objective:  There were no vitals filed for this visit. BP Readings from Last 3 Encounters:  07/23/24 110/60  07/09/24 (!) 142/70  07/08/24 (!) 152/88   Wt Readings from Last 3 Encounters:  07/23/24 122 lb (55.3 kg)  07/09/24 123 lb (55.8 kg)  07/08/24 120 lb (54.4 kg)   There is no height or weight on file to calculate BMI.    Physical Exam     Lab Results  Component Value Date   WBC 7.2 07/08/2024   HGB 14.6 07/08/2024   HCT 45.2 07/08/2024   PLT 211 07/08/2024   GLUCOSE 96 07/08/2024   CHOL 244 (H) 10/31/2023   TRIG 100.0 10/31/2023   HDL 63.70 10/31/2023   LDLDIRECT 146.0 10/28/2022   LDLCALC 161 (H) 10/31/2023   ALT 15 10/31/2023   AST 21 10/31/2023   NA 137 07/08/2024   K 4.1 07/08/2024   CL 102 07/08/2024   CREATININE 0.87 07/08/2024   BUN 18 07/08/2024   CO2 25 07/08/2024   TSH 3.55 10/31/2023   HGBA1C 6.1 10/31/2023     Assessment & Plan:    See Problem List for Assessment and Plan of chronic medical problems.       [1]  Current Outpatient Medications on File Prior to Visit  Medication Sig Dispense Refill   albuterol  (VENTOLIN  HFA) 108 (90 Base) MCG/ACT  inhaler Inhale 2 puffs into the lungs every 6 (six) hours as needed for wheezing or shortness of breath. 8 g 0   amLODipine  (NORVASC ) 2.5 MG tablet Take 1 tablet (2.5 mg total) by mouth daily. 90 tablet 1   atenolol  (TENORMIN ) 25 MG tablet TAKE 1 TABLET BY MOUTH EVERY DAY 90 tablet 2   Calcium Carb-Cholecalciferol (CALCIUM 600 + D PO) Take by mouth.     chlorpheniramine  (CHLOR-TRIMETON ) 4 MG tablet Take 1 tablet (4 mg total) by mouth 2 (two) times daily as needed for allergies.     cholecalciferol (VITAMIN D ) 25 MCG (1000 UNIT) tablet Take 1,000 Units by mouth daily.     EPINEPHrine  0.3 mg/0.3 mL IJ SOAJ injection Inject 0.3 mg into the muscle as needed for anaphylaxis. 1 each 0   gabapentin  (NEURONTIN ) 100 MG capsule TAKE 1 CAPSULE (100 MG TOTAL) BY MOUTH AT BEDTIME. TAKE 1 TO 3 CAPSULES AT BEDTIME 30 capsule 3   ipratropium (ATROVENT ) 0.06 % nasal spray Place 1-2 sprays into both nostrils 3 (three) times daily as needed (runny nose/drainage). 15 mL 3   levothyroxine  (SYNTHROID ) 75 MCG tablet TAKE 1 TABLET BY MOUTH EVERY DAY 6 DAYS A WEEK AND 1.5 TABLET PO ONE DAY A WEEK 96 tablet 3   mometasone  (  NASONEX ) 50 MCG/ACT nasal spray Place 2 sprays into the nose daily. 1 each 12   predniSONE  (STERAPRED UNI-PAK 48 TAB) 5 MG (48) TBPK tablet 12 day dosepack po 48 tablet 0   propranolol  (INDERAL ) 10 MG tablet Take 1 tablet (10 mg total) by mouth 3 (three) times daily as needed (flushing). 90 tablet 1   Current Facility-Administered Medications on File Prior to Visit  Medication Dose Route Frequency Provider Last Rate Last Admin   denosumab  (PROLIA ) injection 60 mg  60 mg Subcutaneous Once Geofm Glade PARAS, MD       denosumab  (PROLIA ) injection 60 mg  60 mg Subcutaneous Q6 months Geofm, Seerat Peaden J, MD   60 mg at 02/02/24 1542   denosumab  (PROLIA ) injection 60 mg  60 mg Subcutaneous Q6 months Geofm Glade PARAS, MD

## 2024-08-06 ENCOUNTER — Ambulatory Visit (INDEPENDENT_AMBULATORY_CARE_PROVIDER_SITE_OTHER): Admitting: Internal Medicine

## 2024-08-06 ENCOUNTER — Encounter: Payer: Self-pay | Admitting: Internal Medicine

## 2024-08-06 VITALS — BP 136/78 | HR 78 | Temp 98.0°F | Ht 60.0 in | Wt 121.0 lb

## 2024-08-06 DIAGNOSIS — G4486 Cervicogenic headache: Secondary | ICD-10-CM

## 2024-08-06 DIAGNOSIS — M810 Age-related osteoporosis without current pathological fracture: Secondary | ICD-10-CM

## 2024-08-06 DIAGNOSIS — I10 Essential (primary) hypertension: Secondary | ICD-10-CM

## 2024-08-06 DIAGNOSIS — M5431 Sciatica, right side: Secondary | ICD-10-CM | POA: Diagnosis not present

## 2024-08-06 MED ORDER — FAMOTIDINE 20 MG PO TABS: 20.0000 mg | ORAL_TABLET | Freq: Every day | ORAL | 5 refills | Status: AC | PRN

## 2024-08-06 MED ORDER — DENOSUMAB 60 MG/ML ~~LOC~~ SOSY: 60.0000 mg | PREFILLED_SYRINGE | Freq: Once | SUBCUTANEOUS | Status: AC

## 2024-08-06 MED ORDER — AMLODIPINE BESYLATE 2.5 MG PO TABS: 2.5000 mg | ORAL_TABLET | Freq: Two times a day (BID) | ORAL | 1 refills | Status: AC

## 2024-08-06 NOTE — Assessment & Plan Note (Signed)
 Chronic Blood pressure controlled Continue atenolol  25 mg daily, amlodipine  2.5 mg daily Monitor BP closely

## 2024-08-06 NOTE — Patient Instructions (Addendum)
" ° ° ° ° °  Medications changes include :   amlodipine  2.5 mg twice a day.    Return for Physical Exam.  "

## 2024-08-07 ENCOUNTER — Other Ambulatory Visit: Payer: Self-pay | Admitting: Allergy

## 2024-08-07 NOTE — Assessment & Plan Note (Signed)
 Subacute Improved after steroids, but still present Doing PT - will have a little break over the holiday Avoid activities that worsen pain

## 2024-08-07 NOTE — Assessment & Plan Note (Addendum)
 Chronic Currently controlled Has seen neurology and neurosurgery She is currently taking gabapentin  300 mg at bedtime  Was not eligible for nerve blocks/ablation or injections Could increase gabapentin  or try a different mm relaxer Will see Dr. Oneita at the headache clinic next year

## 2024-08-07 NOTE — Assessment & Plan Note (Signed)
 Chronic DEXA up-to-date  Received Prolia   today Continue Prolia  every 6 months Stressed regular exercise, calcium and vitamin D   Last vitamin D  Lab Results  Component Value Date   VD25OH 37.51 10/31/2023

## 2024-08-13 ENCOUNTER — Encounter (HOSPITAL_BASED_OUTPATIENT_CLINIC_OR_DEPARTMENT_OTHER): Payer: Self-pay

## 2024-08-13 ENCOUNTER — Ambulatory Visit (HOSPITAL_BASED_OUTPATIENT_CLINIC_OR_DEPARTMENT_OTHER)

## 2024-08-13 DIAGNOSIS — M6281 Muscle weakness (generalized): Secondary | ICD-10-CM

## 2024-08-13 DIAGNOSIS — M5441 Lumbago with sciatica, right side: Secondary | ICD-10-CM | POA: Diagnosis not present

## 2024-08-13 DIAGNOSIS — G8929 Other chronic pain: Secondary | ICD-10-CM

## 2024-08-13 NOTE — Therapy (Signed)
 " OUTPATIENT PHYSICAL THERAPY THORACOLUMBAR TREATMENT   Patient Name: Emily [name] MRN: [id number] DOB:[birth year], 83 y.o., female Today's Date: 08/13/2024  END OF SESSION:  PT End of Session - 08/13/24 0935     Visit Number 3    Number of Visits 6    Date for Recertification  08/31/24    Authorization Type HTA    PT Start Time 0930    PT Stop Time 1017    PT Time Calculation (min) 47 min    Activity Tolerance Patient tolerated treatment well    Behavior During Therapy WFL for tasks assessed/performed            Past Medical History:  Diagnosis Date   Actinic keratosis    Allergic rhinitis    BACK PAIN    CARCINOMA, BREAST, 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   L s/p lumpectomy, chemo and XRT   Cervicalgia    Cholelithiasis    DIVERTICULOSIS, COLON    ENDOMETRIOSIS    Heart palpitations    Hypertension 07/09/2024   HYPOTHYROIDISM    Left wrist fracture 11/06/2016   10/2016 - fell of high stool   Osteoporosis    Palpitations    Personal history of chemotherapy    Personal history of radiation therapy    Pleural plaque without asbestos    CT chest 10/2010: R>L lobes, upper> lower   Renal disorder    Squamous cell carcinoma    History of BC   Past Surgical History:  Procedure Laterality Date   BREAST LUMPECTOMY  2001   left   CATARACT EXTRACTION Bilateral 09/2014   both eyes   CHOLECYSTECTOMY  2010   ENDOMETRIAL ABLATION     EXCISION / BIOPSY BREAST / NIPPLE / DUCT Left 2001   GLUTEUS MINIMUS REPAIR Left 09/09/2023   Procedure: LEFT GLUTEUS MAXIMUS TENDON TRANSFER REPAIR;  Surgeon: Genelle Standing, MD;  Location: [facility];  Service: Orthopedics;  Laterality: Left;   Patient Active Problem List   Diagnosis Date Noted   Hypertension 07/09/2024   URI (upper respiratory infection) 04/12/2024   Wheezing 04/12/2024   RLS (restless legs syndrome) 10/31/2023   Vitamin D  deficiency 10/31/2023   Tendinopathy of left gluteus medius 09/09/2023    Acute cystitis 04/16/2023   Change in bowel habits 01/15/2023   Anxiety 01/01/2023   Agatston coronary artery calcium score 22.4  (12/2021) 10/30/2022   Urge incontinence of urine 10/30/2022   Word finding difficulty 10/30/2022   Chronic pain of left knee 08/21/2022   Sciatica of right side 03/25/2022   Cough 12/27/2021   Sleep difficulties 09/27/2021   Cervicogenic headache 04/10/2021   Skin abnormality 02/27/2021   Aortic atherosclerosis 09/22/2020   Urinary urgency 03/23/2020   CKD (chronic kidney disease) stage 3, GFR 30-59 ml/min (HCC) 03/22/2020   Prediabetes 03/22/2020   Change in stool 11/17/2019   Right-sided chest wall pain 11/17/2019   Urinary retention 09/23/2019   SOB (shortness of breath) 01/15/2019   Upper airway cough syndrome 01/15/2019   Reactive airway disease 12/05/2018   Tear of left rotator cuff 10/05/2018   Pre-syncope 05/19/2018   DDD (degenerative disc disease), cervical 01/15/2018   Neuralgia 09/12/2017   Presbycusis of both ears 12/11/2016   Memory difficulties 11/06/2016   IBS (irritable bowel syndrome) 04/16/2016   Hyperlipidemia 09/10/2015   Osteoporosis 09/08/2015   Palpitations    Pleural plaque without asbestos 12/03/2010   Chronic neck pain 12/18/2009   Diverticulosis of colon 03/02/2009   Lumbago 03/11/2008  Actinic keratosis 01/23/2008   History of left breast cancer 10/12/2007   Hypothyroidism 06/18/2007   Allergic rhinitis 06/11/2007     REFERRING PROVIDER: Emiliano Leonce CROME, PA-C  REFERRING DIAG: [phone] (ICD-10-CM) - Chronic low back pain with right-sided sciatica, unspecified back pain laterality  Rationale for Evaluation and Treatment: Rehabilitation  THERAPY DIAG:  Chronic right-sided low back pain with right-sided sciatica  Muscle weakness (generalized)  ONSET DATE:  Approx 06/29/24  SUBJECTIVE:                                                                                                                                                                                            SUBJECTIVE STATEMENT: Pt reports having increased numbness in R LE after doing HEP in the days following last PT session. Pt reports having difficulty with the coordination of the exercises. Pt reports numbness occurs when she is standing or walking. Feels like pins and needles. Denies bowel/bladder changes. Had a lot of back pain last night.    PERTINENT HISTORY:  L hip gluteus maximus tendon transfers and trochanteric bursectomy on 09/09/23 Osteoporosis, bilat occipital neuralgia DDD  PAIN:  NPRS:  3/10 current, 8/10 worst Location:  central and Lower R sided lumbar, R proximal post thigh   PRECAUTIONS: Other: osteoporosis, L glute max tendon transfer on 09/09/23    WEIGHT BEARING RESTRICTIONS: No  FALLS:  Has patient fallen in last 6 months? No  LIVING ENVIRONMENT: Lives with: lives with their spouse Lives in:  primarily one floor, has a bonus room upstairs Stairs: yes    PLOF: Independent  PATIENT GOALS:  less pain, more comfortable sleeping (also affected by occipital neuralgia)  OBJECTIVE:  Note: Objective measures were completed at Evaluation unless otherwise noted.  DIAGNOSTIC FINDINGS:  Xray (AP pelvis, right hip 3 views): Negative for acute fracture or dislocation.  Hip joint spaces are well-maintained with minimal degenerative changes.  Per Dr. Virgilio note: X-ray images lumbar spine obtained today personally and independently interpreted. No acute fractures.  Multilevel degenerative changes are present.  Lumbar X rays: FINDINGS: 5 nonrib bearing lumbar-type vertebral bodies.   Vertebral body heights are maintained. No acute fracture. Generalized osteopenia.   Minimal retrolisthesis of L2 on L3 and L1 on L2. No spondylolysis.   Moderate disc height loss at L5-S1. Mild disc height loss at L1-2 and L2-3. Mild bilateral facet arthropathy at L5-S1.   SI joints are unremarkable.    IMPRESSION: 1. Lumbar spine spondylosis as described above. 2. No acute osseous injury of the lumbar spine.  PATIENT SURVEYS:  Modified Oswestry Disability Index:  16= 32%  COGNITION: Overall cognitive status:  Within functional limits for tasks assessed     PALPATION: TTP:  superior L glute.  No tenderness in bilat lumbar paraspinals or central lumbar.   LUMBAR ROM:   AROM eval  Flexion   Extension   Right lateral flexion   Left lateral flexion   Right rotation   Left rotation    (Blank rows = not tested)  LOWER EXTREMITY STRENGTH:     MMT  Right eval Left eval  Hip flexion 5/5 4+/5  Hip extension    Hip abduction 19.2 18.8  Hip adduction    Hip internal rotation    Hip external rotation    Knee flexion 5/5 seated 5/5 seated  Knee extension 4/5 4/5  Ankle dorsiflexion    Ankle plantarflexion    Ankle inversion    Ankle eversion     (Blank rows = not tested)   LUMBAR SPECIAL TESTS:  SLR Test:  negative bilat   GAIT:  Assistive device utilized: None Level of assistance: Complete Independence Comments: WFL.  Pt had no limp with gait.   TREATMENT:                                                                                                                                12/26: Nu-step L4 x46min  HSS in supine Pirifomris stretch Figure 4 stretch LTR x10 3second holds Supine nerve glides STM to L gluteal mm in s/l S/l clams 2x15       Previous: Reviewed pt presentation, response to prior treatment, and pain level.   Nustep lvl 3 bilat UE/LE x 5 mins PT educated pt in correct performance and palpation of TrA contraction.  Pt performed supine TrA contraction with and without a 3 sec hold.  Supine bridge with TrA 2x10 Supine alt LE extension with TrA 2x10 LAQ R:  2x5, L:  x10 Supine manual HS stretch bilat 2 reps with passive AP's on R LE for neural flossing.  Pt received a HEP handout and was educated in correct form and appropriate  frequency.  PT instructed pt she should not have pain with HEP.     PATIENT EDUCATION:  Education details: dx, relevant anatomy, rationale of interventions, POC, prognosis, exercise form, and HEP.  PT answered pt's questions.   Person educated: Patient Education method: Explanation, demonstration, verbal and tactile cues, handout Education comprehension: verbalized understanding, returned demonstration, verbal and tactile cues required, needs further instruction  HOME EXERCISE PROGRAM: Access Code: [facility] URL: [email or web address] Date: 08/02/2024 Prepared by: Mose Minerva  Exercises - Supine Transversus Abdominis Bracing - Hands on Stomach  - 2 x daily - 7 x weekly - 2 sets - 10 reps - 3 second hold - Supine Bridge  - 1 x daily - 6-7 x weekly - 2 sets - 10 reps - Supine Core Control with Leg Extension  - 1 x daily - 7 x weekly - 2 sets - 10 reps  ASSESSMENT:  CLINICAL  IMPRESSION: Pt did experience some numbness in R LE with MT when pressure applied near sacral border and PSIS area. This improved after removing pressure. Good tolerance for gentle hip stretching and nerve glides. Added these to HEP. Also instructed in gentle self IASTM using tennis ball to perform at home. Instructed pt to stop current exercises as they increased her numbness. Trialed standing lumbar extension which triggered R LE numbness. Will continue to monitor numbness and pain as we progress with PT.     OBJECTIVE IMPAIRMENTS: decreased activity tolerance, decreased mobility, decreased strength, and pain.   ACTIVITY LIMITATIONS: bending and locomotion level  PARTICIPATION LIMITATIONS: cleaning and community activity  PERSONAL FACTORS: 1-2 comorbidities: Other: osteoporosis, L glute max tendon transfer  are also affecting patient's functional outcome.   REHAB POTENTIAL: Good  CLINICAL DECISION MAKING: Stable/uncomplicated  EVALUATION COMPLEXITY: Low   GOALS:   SHORT TERM GOALS:  Target date: 08/17/2024   Pt will tolerate exercises without adverse effects for improved strength, function, and mobility.  Baseline: Goal status: INITIAL  2.  Pt will continue to have no feelings of her LE giving out with her normal ambulation.  Baseline:  Goal status: INITIAL   LONG TERM GOALS: Target date: 08/31/2024  Pt's worst pain will be no > than 3/10. Baseline:  Goal status: INITIAL  2.  Pt will report continued performance of daily activities and community ambulation without significant pain and difficulty.  Baseline:  Goal status: INITIAL  3.  Pt will demo improved bilat LE strength by 5 lb's in hip abd and to 5/5 in knee extension for improved performance and tolerance of IADL's and functional mobility.  Baseline:  Goal status: INITIAL  4.  Pt will be independent with HEP for improved strength, pain, and function.  Baseline:  Goal status: INITIAL    PLAN:  PT FREQUENCY: 1x/week  PT DURATION: 5 weeks  PLANNED INTERVENTIONS: [id number]- PT Re-evaluation, 97750- Physical Performance Testing, 97110-Therapeutic exercises, 97530- Therapeutic activity, [id number]- Neuromuscular re-education, 97535- Self Care, [id number]- Manual therapy, [id number]- Gait training, [phone]- Aquatic Therapy, [phone]- Electrical stimulation (unattended), [phone]- Electrical stimulation (manual), [id number]- Ultrasound, 79439 (1-2 muscles), [id number] (3+ muscles)- Dry Needling, Patient/Family education, Balance training, Stair training, Taping, DME instructions, Cryotherapy, and Moist heat.  PLAN FOR NEXT SESSION:  Core and LE strengthening per pt tolerance.  Neural mobilization as tolerated.   Asberry Rodes, PTA  08/13/2024 11:03 AM    "

## 2024-08-18 ENCOUNTER — Encounter (HOSPITAL_BASED_OUTPATIENT_CLINIC_OR_DEPARTMENT_OTHER): Payer: Self-pay

## 2024-08-18 ENCOUNTER — Ambulatory Visit (HOSPITAL_BASED_OUTPATIENT_CLINIC_OR_DEPARTMENT_OTHER): Payer: Self-pay

## 2024-08-18 DIAGNOSIS — G8929 Other chronic pain: Secondary | ICD-10-CM

## 2024-08-18 DIAGNOSIS — M5441 Lumbago with sciatica, right side: Secondary | ICD-10-CM | POA: Diagnosis not present

## 2024-08-18 DIAGNOSIS — M6281 Muscle weakness (generalized): Secondary | ICD-10-CM

## 2024-08-18 NOTE — Therapy (Addendum)
 " OUTPATIENT PHYSICAL THERAPY THORACOLUMBAR TREATMENT   Patient Name: Emily [name] MRN: [id number] DOB:[birth year], 83 y.o., female Today's Date: 08/18/2024  END OF SESSION:  PT End of Session - 08/18/24 0808     Visit Number 4    Number of Visits 6    Date for Recertification  08/31/24    Authorization Type HTA    PT Start Time 0802    PT Stop Time 0845    PT Time Calculation (min) 43 min    Activity Tolerance Patient tolerated treatment well    Behavior During Therapy WFL for tasks assessed/performed             Past Medical History:  Diagnosis Date   Actinic keratosis    Allergic rhinitis    BACK PAIN    CARCINOMA, BREAST, 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   L s/p lumpectomy, chemo and XRT   Cervicalgia    Cholelithiasis    DIVERTICULOSIS, COLON    ENDOMETRIOSIS    Heart palpitations    Hypertension 07/09/2024   HYPOTHYROIDISM    Left wrist fracture 11/06/2016   10/2016 - fell of high stool   Osteoporosis    Palpitations    Personal history of chemotherapy    Personal history of radiation therapy    Pleural plaque without asbestos    CT chest 10/2010: R>L lobes, upper> lower   Renal disorder    Squamous cell carcinoma    History of BC   Past Surgical History:  Procedure Laterality Date   BREAST LUMPECTOMY  2001   left   CATARACT EXTRACTION Bilateral 09/2014   both eyes   CHOLECYSTECTOMY  2010   ENDOMETRIAL ABLATION     EXCISION / BIOPSY BREAST / NIPPLE / DUCT Left 2001   GLUTEUS MINIMUS REPAIR Left 09/09/2023   Procedure: LEFT GLUTEUS MAXIMUS TENDON TRANSFER REPAIR;  Surgeon: Genelle Standing, MD;  Location: [facility];  Service: Orthopedics;  Laterality: Left;   Patient Active Problem List   Diagnosis Date Noted   Hypertension 07/09/2024   URI (upper respiratory infection) 04/12/2024   Wheezing 04/12/2024   RLS (restless legs syndrome) 10/31/2023   Vitamin D  deficiency 10/31/2023   Tendinopathy of left gluteus medius 09/09/2023    Acute cystitis 04/16/2023   Change in bowel habits 01/15/2023   Anxiety 01/01/2023   Agatston coronary artery calcium score 22.4  (12/2021) 10/30/2022   Urge incontinence of urine 10/30/2022   Word finding difficulty 10/30/2022   Chronic pain of left knee 08/21/2022   Sciatica of right side 03/25/2022   Cough 12/27/2021   Sleep difficulties 09/27/2021   Cervicogenic headache 04/10/2021   Skin abnormality 02/27/2021   Aortic atherosclerosis 09/22/2020   Urinary urgency 03/23/2020   CKD (chronic kidney disease) stage 3, GFR 30-59 ml/min (HCC) 03/22/2020   Prediabetes 03/22/2020   Change in stool 11/17/2019   Right-sided chest wall pain 11/17/2019   Urinary retention 09/23/2019   SOB (shortness of breath) 01/15/2019   Upper airway cough syndrome 01/15/2019   Reactive airway disease 12/05/2018   Tear of left rotator cuff 10/05/2018   Pre-syncope 05/19/2018   DDD (degenerative disc disease), cervical 01/15/2018   Neuralgia 09/12/2017   Presbycusis of both ears 12/11/2016   Memory difficulties 11/06/2016   IBS (irritable bowel syndrome) 04/16/2016   Hyperlipidemia 09/10/2015   Osteoporosis 09/08/2015   Palpitations    Pleural plaque without asbestos 12/03/2010   Chronic neck pain 12/18/2009   Diverticulosis of colon 03/02/2009   Lumbago  03/11/2008   Actinic keratosis 01/23/2008   History of left breast cancer 10/12/2007   Hypothyroidism 06/18/2007   Allergic rhinitis 06/11/2007     REFERRING PROVIDER: Emiliano Leonce CROME, PA-C  REFERRING DIAG: [phone] (ICD-10-CM) - Chronic low back pain with right-sided sciatica, unspecified back pain laterality  Rationale for Evaluation and Treatment: Rehabilitation  THERAPY DIAG:  Chronic right-sided low back pain with right-sided sciatica  Muscle weakness (generalized)  ONSET DATE:  Approx 06/29/24  SUBJECTIVE:                                                                                                                                                                                            SUBJECTIVE STATEMENT: Pt reports numbness is improving. I still have some, but it's not for long periods of time.   PERTINENT HISTORY:  L hip gluteus maximus tendon transfers and trochanteric bursectomy on 09/09/23 Osteoporosis, bilat occipital neuralgia DDD  PAIN:  NPRS:  3/10 current, 8/10 worst Location:  central and Lower R sided lumbar, R proximal post thigh   PRECAUTIONS: Other: osteoporosis, L glute max tendon transfer on 09/09/23    WEIGHT BEARING RESTRICTIONS: No  FALLS:  Has patient fallen in last 6 months? No  LIVING ENVIRONMENT: Lives with: lives with their spouse Lives in:  primarily one floor, has a bonus room upstairs Stairs: yes    PLOF: Independent  PATIENT GOALS:  less pain, more comfortable sleeping (also affected by occipital neuralgia)  OBJECTIVE:  Note: Objective measures were completed at Evaluation unless otherwise noted.  DIAGNOSTIC FINDINGS:  Xray (AP pelvis, right hip 3 views): Negative for acute fracture or dislocation.  Hip joint spaces are well-maintained with minimal degenerative changes.  Per Dr. Virgilio note: X-ray images lumbar spine obtained today personally and independently interpreted. No acute fractures.  Multilevel degenerative changes are present.  Lumbar X rays: FINDINGS: 5 nonrib bearing lumbar-type vertebral bodies.   Vertebral body heights are maintained. No acute fracture. Generalized osteopenia.   Minimal retrolisthesis of L2 on L3 and L1 on L2. No spondylolysis.   Moderate disc height loss at L5-S1. Mild disc height loss at L1-2 and L2-3. Mild bilateral facet arthropathy at L5-S1.   SI joints are unremarkable.   IMPRESSION: 1. Lumbar spine spondylosis as described above. 2. No acute osseous injury of the lumbar spine.  PATIENT SURVEYS:  Modified Oswestry Disability Index:  16= 32%  COGNITION: Overall cognitive status: Within  functional limits for tasks assessed     PALPATION: TTP:  superior L glute.  No tenderness in bilat lumbar paraspinals or central lumbar.   LUMBAR ROM:   AROM eval  Flexion   Extension   Right lateral flexion   Left lateral flexion   Right rotation   Left rotation    (Blank rows = not tested)  LOWER EXTREMITY STRENGTH:     MMT  Right eval Left eval  Hip flexion 5/5 4+/5  Hip extension    Hip abduction 19.2 18.8  Hip adduction    Hip internal rotation    Hip external rotation    Knee flexion 5/5 seated 5/5 seated  Knee extension 4/5 4/5  Ankle dorsiflexion    Ankle plantarflexion    Ankle inversion    Ankle eversion     (Blank rows = not tested)   LUMBAR SPECIAL TESTS:  SLR Test:  negative bilat   GAIT:  Assistive device utilized: None Level of assistance: Complete Independence Comments: WFL.  Pt had no limp with gait.   TREATMENT:                                                                                                                                12/31: Nu-step L4 x45min  HSS in supine 3x30sec Pirifomris stretch 20sec x3 Hooklying adductor squeeze with TrA 5 2x10 LTR x15 3second holds Supine sciatic nerve glides x10 STM to L gluteal mm in s/l S/l clams 2x15bil Seated sciatic nerve glides x10 Gait in hall x1lap Partial squats x10   12/26: Nu-step L4 x87min  HSS in supine Pirifomris stretch Figure 4 stretch LTR x10 3second holds Supine nerve glides STM to L gluteal mm in s/l S/l clams 2x15       Previous: Reviewed pt presentation, response to prior treatment, and pain level.   Nustep lvl 3 bilat UE/LE x 5 mins PT educated pt in correct performance and palpation of TrA contraction.  Pt performed supine TrA contraction with and without a 3 sec hold.  Supine bridge with TrA 2x10 Supine alt LE extension with TrA 2x10 LAQ R:  2x5, L:  x10 Supine manual HS stretch bilat 2 reps with passive AP's on R LE for neural flossing.  Pt  received a HEP handout and was educated in correct form and appropriate frequency.  PT instructed pt she should not have pain with HEP.     PATIENT EDUCATION:  Education details: dx, relevant anatomy, rationale of interventions, POC, prognosis, exercise form, and HEP.  PT answered pt's questions.   Person educated: Patient Education method: Explanation, demonstration, verbal and tactile cues, handout Education comprehension: verbalized understanding, returned demonstration, verbal and tactile cues required, needs further instruction  HOME EXERCISE PROGRAM: Access Code: [facility] URL: [email or web address] Date: 08/02/2024 Prepared by: Mose Minerva  Exercises - Supine Transversus Abdominis Bracing - Hands on Stomach  - 2 x daily - 7 x weekly - 2 sets - 10 reps - 3 second hold - Supine Bridge  - 1 x daily - 6-7 x weekly - 2 sets - 10 reps - Supine Core Control with Leg Extension  -  1 x daily - 7 x weekly - 2 sets - 10 reps  ASSESSMENT:  CLINICAL IMPRESSION: Continued with regressed program and focus on soft tissue mobilizations and nerve gliding to improve radiating symptoms. Pt denied any numbness with manual pressure during STM today. Good tolerance for nerve glides without complaint. Will continue to monitor radiating sx as we progress with PT interventions.     OBJECTIVE IMPAIRMENTS: decreased activity tolerance, decreased mobility, decreased strength, and pain.   ACTIVITY LIMITATIONS: bending and locomotion level  PARTICIPATION LIMITATIONS: cleaning and community activity  PERSONAL FACTORS: 1-2 comorbidities: Other: osteoporosis, L glute max tendon transfer  are also affecting patient's functional outcome.   REHAB POTENTIAL: Good  CLINICAL DECISION MAKING: Stable/uncomplicated  EVALUATION COMPLEXITY: Low   GOALS:   SHORT TERM GOALS: Target date: 08/17/2024   Pt will tolerate exercises without adverse effects for improved strength, function, and  mobility.  Baseline: Goal status: IN PROGRESS 12/31  2.  Pt will continue to have no feelings of her LE giving out with her normal ambulation.  Baseline:  Goal status: IN PROGRESS 12/31   LONG TERM GOALS: Target date: 08/31/2024  Pt's worst pain will be no > than 3/10. Baseline:  Goal status: INITIAL  2.  Pt will report continued performance of daily activities and community ambulation without significant pain and difficulty.  Baseline:  Goal status: INITIAL  3.  Pt will demo improved bilat LE strength by 5 lb's in hip abd and to 5/5 in knee extension for improved performance and tolerance of IADL's and functional mobility.  Baseline:  Goal status: INITIAL  4.  Pt will be independent with HEP for improved strength, pain, and function.  Baseline:  Goal status: INITIAL    PLAN:  PT FREQUENCY: 1x/week  PT DURATION: 5 weeks  PLANNED INTERVENTIONS: [id number]- PT Re-evaluation, 97750- Physical Performance Testing, 97110-Therapeutic exercises, 97530- Therapeutic activity, [id number]- Neuromuscular re-education, 97535- Self Care, [id number]- Manual therapy, [id number]- Gait training, [phone]- Aquatic Therapy, [phone]- Electrical stimulation (unattended), [phone]- Electrical stimulation (manual), [id number]- Ultrasound, 79439 (1-2 muscles), 20561 (3+ muscles)- Dry Needling, Patient/Family education, Balance training, Stair training, Taping, DME instructions, Cryotherapy, and Moist heat.  PLAN FOR NEXT SESSION:  Core and LE strengthening per pt tolerance.  Neural mobilization as tolerated.   Asberry Rodes, PTA  08/18/2024 9:31 AM    "

## 2024-08-20 ENCOUNTER — Encounter (HOSPITAL_BASED_OUTPATIENT_CLINIC_OR_DEPARTMENT_OTHER)

## 2024-08-26 ENCOUNTER — Ambulatory Visit (HOSPITAL_BASED_OUTPATIENT_CLINIC_OR_DEPARTMENT_OTHER): Attending: Student

## 2024-08-27 ENCOUNTER — Encounter: Payer: Self-pay | Admitting: Internal Medicine

## 2024-08-27 ENCOUNTER — Other Ambulatory Visit: Payer: Self-pay

## 2024-08-27 MED ORDER — ATENOLOL 25 MG PO TABS: 25.0000 mg | ORAL_TABLET | Freq: Every day | ORAL | 2 refills | Status: AC

## 2024-09-01 ENCOUNTER — Ambulatory Visit: Payer: Self-pay | Admitting: Internal Medicine

## 2024-09-01 ENCOUNTER — Other Ambulatory Visit (INDEPENDENT_AMBULATORY_CARE_PROVIDER_SITE_OTHER)

## 2024-09-01 ENCOUNTER — Other Ambulatory Visit: Payer: Self-pay | Admitting: Internal Medicine

## 2024-09-01 ENCOUNTER — Ambulatory Visit: Payer: Self-pay

## 2024-09-01 ENCOUNTER — Telehealth: Payer: Self-pay

## 2024-09-01 DIAGNOSIS — B078 Other viral warts: Secondary | ICD-10-CM | POA: Insufficient documentation

## 2024-09-01 DIAGNOSIS — D2330 Other benign neoplasm of skin of unspecified part of face: Secondary | ICD-10-CM | POA: Insufficient documentation

## 2024-09-01 DIAGNOSIS — R300 Dysuria: Secondary | ICD-10-CM

## 2024-09-01 DIAGNOSIS — L720 Epidermal cyst: Secondary | ICD-10-CM | POA: Insufficient documentation

## 2024-09-01 DIAGNOSIS — D2261 Melanocytic nevi of right upper limb, including shoulder: Secondary | ICD-10-CM | POA: Insufficient documentation

## 2024-09-01 DIAGNOSIS — L719 Rosacea, unspecified: Secondary | ICD-10-CM | POA: Insufficient documentation

## 2024-09-01 LAB — URINALYSIS, ROUTINE W REFLEX MICROSCOPIC
Bilirubin Urine: NEGATIVE
Ketones, ur: NEGATIVE
Nitrite: POSITIVE — AB
Specific Gravity, Urine: 1.01 (ref 1.000–1.030)
Total Protein, Urine: NEGATIVE
Urine Glucose: NEGATIVE
Urobilinogen, UA: 0.2 (ref 0.0–1.0)
pH: 5.5 (ref 5.0–8.0)

## 2024-09-01 MED ORDER — SULFAMETHOXAZOLE-TRIMETHOPRIM 800-160 MG PO TABS: 1.0000 | ORAL_TABLET | Freq: Two times a day (BID) | ORAL | 0 refills | Status: AC

## 2024-09-01 NOTE — Telephone Encounter (Signed)
 Copied from CRM [id number]. Topic: Clinical - Lab/Test Results >> Sep 01, 2024  4:23 PM Fonda T wrote: Reason for CRM: Pt calling, states she is able to see results via mychart, but would like to speak to office in regards to results.  Pt requesting a return call to discuss results.  Pt can be reached at [phone].  Pt aware of call back.

## 2024-09-01 NOTE — Telephone Encounter (Signed)
 FYI Only or Action Required?: FYI only for provider: appointment scheduled on 09/02/2024.  Patient was last seen in primary care on 08/06/2024 by Geofm Glade PARAS, MD.  Called Nurse Triage reporting Dysuria.  Symptoms began several days ago.  Interventions attempted: Nothing.  Symptoms are: gradually worsening.  Triage Disposition: See Physician Within 24 Hours  Patient/caregiver understands and will follow disposition?: Yes  Copied from CRM #[id number]. Topic: Clinical - Red Word Triage >> Sep 01, 2024  8:45 AM Treva T wrote: Red Word that prompted transfer to Nurse Triage: Pt thinks she has UTI, with worsening symptoms of urgency every 15 minutes, very little urine when goes to restroom, feels like she has to go all the time.  Pt requesting appt fro evaluation. . Reason for Disposition  Urinating more frequently than usual (i.e., frequency) OR new-onset of the feeling of an urgent need to urinate (i.e., urgency)  Answer Assessment - Initial Assessment Questions Contacted [facility] and spoke to nurse Hadassah, who put in orders for urinalysis. Explained that patient may leave urine sample.  Should keep appt. Scheduled for 09/02/2024  1. SYMPTOM: What's the main symptom you're concerned about? (e.g., frequency, incontinence)     frequency 2. ONSET: When did the  symptoms  start?     Five days ago 3. PAIN: Is there any pain? If Yes, ask: How bad is it? (Scale: 1-10; mild, moderate, severe)     Left lower abdominal pain 4. CAUSE: What do you think is causing the symptoms?     UTI 5. OTHER SYMPTOMS: Do you have any other symptoms? (e.g., blood in urine, fever, flank pain, pain with urination)     Lower abdominal pain  Protocols used: Urinary Symptoms-A-AH

## 2024-09-02 ENCOUNTER — Ambulatory Visit: Admitting: Emergency Medicine

## 2024-09-02 ENCOUNTER — Encounter (HOSPITAL_BASED_OUTPATIENT_CLINIC_OR_DEPARTMENT_OTHER): Admitting: Physical Therapy

## 2024-09-02 NOTE — Telephone Encounter (Signed)
Spoke with patient today and she is all set.

## 2024-09-04 LAB — URINE CULTURE

## 2024-09-09 ENCOUNTER — Ambulatory Visit (HOSPITAL_BASED_OUTPATIENT_CLINIC_OR_DEPARTMENT_OTHER): Attending: Student | Admitting: Physical Therapy

## 2024-09-16 ENCOUNTER — Encounter (HOSPITAL_BASED_OUTPATIENT_CLINIC_OR_DEPARTMENT_OTHER): Admitting: Physical Therapy

## 2024-09-23 ENCOUNTER — Encounter (HOSPITAL_BASED_OUTPATIENT_CLINIC_OR_DEPARTMENT_OTHER): Admitting: Physical Therapy

## 2024-11-26 ENCOUNTER — Encounter: Admitting: Internal Medicine

## 2024-12-01 ENCOUNTER — Encounter: Admitting: Internal Medicine

## 2025-01-07 ENCOUNTER — Ambulatory Visit

## 2025-01-07 ENCOUNTER — Encounter: Admitting: Internal Medicine
